# Patient Record
Sex: Female | Born: 1950
Health system: Southern US, Community
[De-identification: ages and names within clinical notes are randomized; demographics above are authoritative.]

## PROBLEM LIST (undated history)

## (undated) DIAGNOSIS — N289 Disorder of kidney and ureter, unspecified: Secondary | ICD-10-CM

## (undated) DIAGNOSIS — E039 Hypothyroidism, unspecified: Secondary | ICD-10-CM

## (undated) DIAGNOSIS — I2510 Atherosclerotic heart disease of native coronary artery without angina pectoris: Secondary | ICD-10-CM

## (undated) DIAGNOSIS — E785 Hyperlipidemia, unspecified: Secondary | ICD-10-CM

## (undated) DIAGNOSIS — E271 Primary adrenocortical insufficiency: Secondary | ICD-10-CM

## (undated) DIAGNOSIS — I951 Orthostatic hypotension: Secondary | ICD-10-CM

## (undated) DIAGNOSIS — I480 Paroxysmal atrial fibrillation: Secondary | ICD-10-CM

## (undated) DIAGNOSIS — J40 Bronchitis, not specified as acute or chronic: Secondary | ICD-10-CM

## (undated) DIAGNOSIS — F419 Anxiety disorder, unspecified: Secondary | ICD-10-CM

## (undated) DIAGNOSIS — R011 Cardiac murmur, unspecified: Secondary | ICD-10-CM

## (undated) DIAGNOSIS — M797 Fibromyalgia: Secondary | ICD-10-CM

## (undated) DIAGNOSIS — J300 Vasomotor rhinitis: Secondary | ICD-10-CM

## (undated) DIAGNOSIS — J383 Other diseases of vocal cords: Secondary | ICD-10-CM

## (undated) DIAGNOSIS — K219 Gastro-esophageal reflux disease without esophagitis: Secondary | ICD-10-CM

## (undated) DIAGNOSIS — M1990 Unspecified osteoarthritis, unspecified site: Secondary | ICD-10-CM

## (undated) DIAGNOSIS — Z9289 Personal history of other medical treatment: Secondary | ICD-10-CM

## (undated) DIAGNOSIS — R053 Chronic cough: Secondary | ICD-10-CM

## (undated) DIAGNOSIS — D649 Anemia, unspecified: Secondary | ICD-10-CM

## (undated) DIAGNOSIS — E0590 Thyrotoxicosis, unspecified without thyrotoxic crisis or storm: Secondary | ICD-10-CM

## (undated) DIAGNOSIS — E876 Hypokalemia: Secondary | ICD-10-CM

## (undated) DIAGNOSIS — I10 Essential (primary) hypertension: Secondary | ICD-10-CM

## (undated) DIAGNOSIS — H269 Unspecified cataract: Secondary | ICD-10-CM

## (undated) DIAGNOSIS — R05 Cough: Secondary | ICD-10-CM

## (undated) DIAGNOSIS — J449 Chronic obstructive pulmonary disease, unspecified: Secondary | ICD-10-CM

## (undated) HISTORY — DX: Cardiac murmur, unspecified: R01.1

## (undated) HISTORY — DX: Paroxysmal atrial fibrillation: I48.0

## (undated) HISTORY — DX: Essential (primary) hypertension: I10

## (undated) HISTORY — DX: Chronic cough: R05.3

## (undated) HISTORY — DX: Atherosclerotic heart disease of native coronary artery without angina pectoris: I25.10

## (undated) HISTORY — DX: Other diseases of vocal cords: J38.3

## (undated) HISTORY — DX: Unspecified osteoarthritis, unspecified site: M19.90

## (undated) HISTORY — PX: ABDOMINAL HYSTERECTOMY: SHX81

## (undated) HISTORY — DX: Bronchitis, not specified as acute or chronic: J40

## (undated) HISTORY — DX: Disorder of kidney and ureter, unspecified: N28.9

## (undated) HISTORY — DX: Orthostatic hypotension: I95.1

## (undated) HISTORY — DX: Unspecified cataract: H26.9

## (undated) HISTORY — DX: Fibromyalgia: M79.7

## (undated) HISTORY — DX: Hyperlipidemia, unspecified: E78.5

## (undated) HISTORY — DX: Hypokalemia: E87.6

## (undated) HISTORY — DX: Vasomotor rhinitis: J30.0

## (undated) HISTORY — DX: Chronic obstructive pulmonary disease, unspecified: J44.9

## (undated) HISTORY — PX: EYE SURGERY: SHX253

## (undated) HISTORY — DX: Gastro-esophageal reflux disease without esophagitis: K21.9

## (undated) HISTORY — DX: Anemia, unspecified: D64.9

## (undated) HISTORY — DX: Anxiety disorder, unspecified: F41.9

## (undated) HISTORY — DX: Thyrotoxicosis, unspecified without thyrotoxic crisis or storm: E05.90

## (undated) HISTORY — DX: Hypothyroidism, unspecified: E03.9

## (undated) HISTORY — DX: Cough: R05

## (undated) HISTORY — PX: COLON SURGERY: SHX602

---

## 1978-08-22 HISTORY — PX: ABDOMINAL HYSTERECTOMY: SUR658

## 1980-08-22 HISTORY — PX: OTHER SURGICAL HISTORY: SHX169

## 2001-11-20 ENCOUNTER — Ambulatory Visit (HOSPITAL_COMMUNITY): Admission: RE | Admit: 2001-11-20 | Discharge: 2001-11-20 | Payer: Self-pay | Admitting: Gastroenterology

## 2001-12-20 ENCOUNTER — Ambulatory Visit (HOSPITAL_COMMUNITY): Admission: RE | Admit: 2001-12-20 | Discharge: 2001-12-20 | Payer: Self-pay | Admitting: Gastroenterology

## 2001-12-20 ENCOUNTER — Encounter (INDEPENDENT_AMBULATORY_CARE_PROVIDER_SITE_OTHER): Payer: Self-pay | Admitting: Specialist

## 2002-01-13 ENCOUNTER — Ambulatory Visit (HOSPITAL_COMMUNITY): Admission: RE | Admit: 2002-01-13 | Discharge: 2002-01-13 | Payer: Self-pay | Admitting: Rheumatology

## 2002-01-13 ENCOUNTER — Encounter: Payer: Self-pay | Admitting: Rheumatology

## 2007-06-23 HISTORY — PX: BASAL CELL CARCINOMA EXCISION: SHX1214

## 2007-09-19 ENCOUNTER — Encounter: Payer: Self-pay | Admitting: Critical Care Medicine

## 2007-12-18 ENCOUNTER — Encounter: Payer: Self-pay | Admitting: Critical Care Medicine

## 2008-03-17 ENCOUNTER — Encounter: Payer: Self-pay | Admitting: Critical Care Medicine

## 2008-04-09 ENCOUNTER — Encounter: Payer: Self-pay | Admitting: Critical Care Medicine

## 2008-04-17 DIAGNOSIS — I498 Other specified cardiac arrhythmias: Secondary | ICD-10-CM

## 2008-04-17 DIAGNOSIS — IMO0001 Reserved for inherently not codable concepts without codable children: Secondary | ICD-10-CM | POA: Insufficient documentation

## 2008-04-17 DIAGNOSIS — M1990 Unspecified osteoarthritis, unspecified site: Secondary | ICD-10-CM | POA: Insufficient documentation

## 2008-04-17 DIAGNOSIS — I10 Essential (primary) hypertension: Secondary | ICD-10-CM

## 2008-04-18 ENCOUNTER — Ambulatory Visit: Payer: Self-pay | Admitting: Critical Care Medicine

## 2008-04-23 ENCOUNTER — Ambulatory Visit: Admission: RE | Admit: 2008-04-23 | Discharge: 2008-04-23 | Payer: Self-pay | Admitting: Critical Care Medicine

## 2008-04-23 ENCOUNTER — Ambulatory Visit: Payer: Self-pay | Admitting: Critical Care Medicine

## 2008-05-12 ENCOUNTER — Ambulatory Visit: Payer: Self-pay | Admitting: Critical Care Medicine

## 2008-05-13 ENCOUNTER — Encounter: Payer: Self-pay | Admitting: Critical Care Medicine

## 2008-05-13 DIAGNOSIS — J383 Other diseases of vocal cords: Secondary | ICD-10-CM

## 2008-05-14 ENCOUNTER — Telehealth (INDEPENDENT_AMBULATORY_CARE_PROVIDER_SITE_OTHER): Payer: Self-pay | Admitting: *Deleted

## 2008-07-01 ENCOUNTER — Ambulatory Visit: Payer: Self-pay | Admitting: Critical Care Medicine

## 2008-07-11 ENCOUNTER — Encounter: Payer: Self-pay | Admitting: Critical Care Medicine

## 2008-07-11 ENCOUNTER — Ambulatory Visit (HOSPITAL_COMMUNITY): Admission: RE | Admit: 2008-07-11 | Discharge: 2008-07-11 | Payer: Self-pay | Admitting: Critical Care Medicine

## 2008-07-14 ENCOUNTER — Encounter: Payer: Self-pay | Admitting: Critical Care Medicine

## 2008-07-25 ENCOUNTER — Encounter: Payer: Self-pay | Admitting: Critical Care Medicine

## 2008-08-01 ENCOUNTER — Ambulatory Visit: Payer: Self-pay | Admitting: Critical Care Medicine

## 2008-08-22 HISTORY — PX: THYROID SURGERY: SHX805

## 2008-08-26 ENCOUNTER — Encounter (HOSPITAL_COMMUNITY): Admission: RE | Admit: 2008-08-26 | Discharge: 2008-08-27 | Payer: Self-pay | Admitting: Endocrinology

## 2008-08-29 ENCOUNTER — Ambulatory Visit (HOSPITAL_COMMUNITY): Admission: RE | Admit: 2008-08-29 | Discharge: 2008-08-29 | Payer: Self-pay | Admitting: Endocrinology

## 2008-09-02 ENCOUNTER — Other Ambulatory Visit: Admission: RE | Admit: 2008-09-02 | Discharge: 2008-09-02 | Payer: Self-pay | Admitting: Interventional Radiology

## 2008-09-02 ENCOUNTER — Encounter: Admission: RE | Admit: 2008-09-02 | Discharge: 2008-09-02 | Payer: Self-pay | Admitting: Endocrinology

## 2008-09-02 ENCOUNTER — Encounter (INDEPENDENT_AMBULATORY_CARE_PROVIDER_SITE_OTHER): Payer: Self-pay | Admitting: Interventional Radiology

## 2008-11-05 ENCOUNTER — Encounter (INDEPENDENT_AMBULATORY_CARE_PROVIDER_SITE_OTHER): Payer: Self-pay | Admitting: General Surgery

## 2008-11-05 ENCOUNTER — Ambulatory Visit (HOSPITAL_COMMUNITY): Admission: RE | Admit: 2008-11-05 | Discharge: 2008-11-06 | Payer: Self-pay | Admitting: General Surgery

## 2008-11-05 HISTORY — PX: THYROIDECTOMY: SHX17

## 2009-02-20 ENCOUNTER — Ambulatory Visit: Payer: Self-pay | Admitting: Vascular Surgery

## 2009-02-20 ENCOUNTER — Ambulatory Visit (HOSPITAL_COMMUNITY): Admission: RE | Admit: 2009-02-20 | Discharge: 2009-02-20 | Payer: Self-pay | Admitting: Family Medicine

## 2009-03-11 ENCOUNTER — Encounter: Payer: Self-pay | Admitting: Critical Care Medicine

## 2009-04-01 ENCOUNTER — Inpatient Hospital Stay (HOSPITAL_COMMUNITY): Admission: EM | Admit: 2009-04-01 | Discharge: 2009-04-05 | Payer: Self-pay | Admitting: Emergency Medicine

## 2009-04-02 ENCOUNTER — Encounter (INDEPENDENT_AMBULATORY_CARE_PROVIDER_SITE_OTHER): Payer: Self-pay | Admitting: Internal Medicine

## 2009-04-02 ENCOUNTER — Ambulatory Visit: Payer: Self-pay | Admitting: Internal Medicine

## 2009-04-08 ENCOUNTER — Ambulatory Visit: Payer: Self-pay | Admitting: Infectious Diseases

## 2009-04-08 LAB — CONVERTED CEMR LAB
Albumin: 4.4 g/dL (ref 3.5–5.2)
BUN: 7 mg/dL (ref 6–23)
Basophils Relative: 1 % (ref 0–1)
CO2: 24 meq/L (ref 19–32)
CRP: 0.1 mg/dL (ref <0.6)
Calcium: 9.6 mg/dL (ref 8.4–10.5)
Chloride: 104 meq/L (ref 96–112)
Creatinine, Ser: 1.23 mg/dL — ABNORMAL HIGH (ref 0.40–1.20)
Eosinophils Absolute: 0 10*3/uL (ref 0.0–0.7)
Eosinophils Relative: 0 % (ref 0–5)
Glucose, Bld: 79 mg/dL (ref 70–99)
HCT: 45.5 % (ref 36.0–46.0)
Lymphs Abs: 2 10*3/uL (ref 0.7–4.0)
MCHC: 31.9 g/dL (ref 30.0–36.0)
MCV: 89.7 fL (ref >=78.0)
Monocytes Absolute: 1 10*3/uL (ref 0.1–1.0)
Monocytes Relative: 12 % (ref 3–12)
Neutrophils Relative %: 65 % (ref 43–77)
Potassium: 4.3 meq/L (ref 3.5–5.3)
RBC: 5.07 M/uL (ref 3.87–5.11)

## 2009-04-17 ENCOUNTER — Telehealth: Payer: Self-pay | Admitting: Infectious Diseases

## 2009-05-01 ENCOUNTER — Ambulatory Visit: Payer: Self-pay | Admitting: Critical Care Medicine

## 2009-05-07 ENCOUNTER — Ambulatory Visit: Payer: Self-pay | Admitting: Infectious Diseases

## 2009-05-07 DIAGNOSIS — Z862 Personal history of diseases of the blood and blood-forming organs and certain disorders involving the immune mechanism: Secondary | ICD-10-CM

## 2009-05-07 DIAGNOSIS — Z8639 Personal history of other endocrine, nutritional and metabolic disease: Secondary | ICD-10-CM

## 2009-05-07 LAB — CONVERTED CEMR LAB
ALT: 15 units/L (ref 0–35)
AST: 15 units/L (ref 0–37)
Albumin: 4.4 g/dL (ref 3.5–5.2)
Alkaline Phosphatase: 68 units/L (ref 39–117)
BUN: 7 mg/dL (ref 6–23)
Calcium: 9.1 mg/dL (ref 8.4–10.5)
Chloride: 102 meq/L (ref 96–112)
Potassium: 3.9 meq/L (ref 3.5–5.3)
Sodium: 139 meq/L (ref 135–145)

## 2009-06-24 ENCOUNTER — Ambulatory Visit: Payer: Self-pay | Admitting: Critical Care Medicine

## 2009-06-24 DIAGNOSIS — J209 Acute bronchitis, unspecified: Secondary | ICD-10-CM

## 2009-07-03 ENCOUNTER — Encounter: Payer: Self-pay | Admitting: Critical Care Medicine

## 2009-07-08 ENCOUNTER — Ambulatory Visit: Payer: Self-pay | Admitting: Critical Care Medicine

## 2009-09-11 ENCOUNTER — Ambulatory Visit: Payer: Self-pay | Admitting: Critical Care Medicine

## 2009-09-14 ENCOUNTER — Encounter: Admission: RE | Admit: 2009-09-14 | Discharge: 2009-09-14 | Payer: Self-pay | Admitting: Neurosurgery

## 2009-11-05 ENCOUNTER — Encounter: Admission: RE | Admit: 2009-11-05 | Discharge: 2009-11-05 | Payer: Self-pay | Admitting: Neurosurgery

## 2009-12-25 ENCOUNTER — Ambulatory Visit: Payer: Self-pay | Admitting: Critical Care Medicine

## 2009-12-25 DIAGNOSIS — J029 Acute pharyngitis, unspecified: Secondary | ICD-10-CM | POA: Insufficient documentation

## 2009-12-27 ENCOUNTER — Telehealth: Payer: Self-pay | Admitting: Critical Care Medicine

## 2010-02-04 ENCOUNTER — Ambulatory Visit: Payer: Self-pay | Admitting: Critical Care Medicine

## 2010-02-04 DIAGNOSIS — J018 Other acute sinusitis: Secondary | ICD-10-CM

## 2010-02-25 ENCOUNTER — Encounter: Payer: Self-pay | Admitting: Internal Medicine

## 2010-03-23 ENCOUNTER — Ambulatory Visit: Payer: Self-pay | Admitting: Internal Medicine

## 2010-03-24 ENCOUNTER — Ambulatory Visit: Payer: Self-pay | Admitting: Cardiology

## 2010-03-24 LAB — CONVERTED CEMR LAB
Basophils Absolute: 0.2 10*3/uL — ABNORMAL HIGH (ref 0.0–0.1)
Basophils Relative: 1 % (ref 0.0–3.0)
Eosinophils Absolute: 0 10*3/uL (ref 0.0–0.7)
Hemoglobin: 14.9 g/dL (ref 12.0–15.0)
Lymphocytes Relative: 16.5 % (ref 12.0–46.0)
MCHC: 34.3 g/dL (ref 30.0–36.0)
MCV: 83.3 fL (ref 78.0–100.0)
Monocytes Absolute: 0.7 10*3/uL (ref 0.1–1.0)
Neutro Abs: 12 10*3/uL — ABNORMAL HIGH (ref 1.4–7.7)
Neutrophils Relative %: 77.9 % — ABNORMAL HIGH (ref 43.0–77.0)
RBC: 5.22 M/uL — ABNORMAL HIGH (ref 3.87–5.11)
RDW: 13.9 % (ref 11.5–14.6)

## 2010-03-26 ENCOUNTER — Emergency Department (HOSPITAL_COMMUNITY): Admission: EM | Admit: 2010-03-26 | Discharge: 2010-03-26 | Payer: Self-pay | Admitting: Emergency Medicine

## 2010-03-31 ENCOUNTER — Ambulatory Visit: Payer: Self-pay | Admitting: Critical Care Medicine

## 2010-03-31 DIAGNOSIS — R05 Cough: Secondary | ICD-10-CM | POA: Insufficient documentation

## 2010-04-01 ENCOUNTER — Telehealth (INDEPENDENT_AMBULATORY_CARE_PROVIDER_SITE_OTHER): Payer: Self-pay | Admitting: *Deleted

## 2010-04-13 ENCOUNTER — Telehealth (INDEPENDENT_AMBULATORY_CARE_PROVIDER_SITE_OTHER): Payer: Self-pay | Admitting: Radiology

## 2010-04-14 ENCOUNTER — Encounter (HOSPITAL_COMMUNITY): Admission: RE | Admit: 2010-04-14 | Discharge: 2010-05-17 | Payer: Self-pay | Admitting: Cardiology

## 2010-04-14 ENCOUNTER — Ambulatory Visit: Payer: Self-pay | Admitting: Cardiology

## 2010-04-14 ENCOUNTER — Encounter: Payer: Self-pay | Admitting: Cardiology

## 2010-04-14 ENCOUNTER — Ambulatory Visit: Payer: Self-pay

## 2010-04-21 ENCOUNTER — Ambulatory Visit: Payer: Self-pay | Admitting: Critical Care Medicine

## 2010-04-21 DIAGNOSIS — J309 Allergic rhinitis, unspecified: Secondary | ICD-10-CM | POA: Insufficient documentation

## 2010-05-04 ENCOUNTER — Ambulatory Visit: Payer: Self-pay | Admitting: Critical Care Medicine

## 2010-05-12 ENCOUNTER — Telehealth (INDEPENDENT_AMBULATORY_CARE_PROVIDER_SITE_OTHER): Payer: Self-pay

## 2010-05-27 ENCOUNTER — Ambulatory Visit: Payer: Self-pay | Admitting: Critical Care Medicine

## 2010-06-15 ENCOUNTER — Ambulatory Visit: Payer: Self-pay | Admitting: Critical Care Medicine

## 2010-06-17 ENCOUNTER — Telehealth: Payer: Self-pay | Admitting: Critical Care Medicine

## 2010-07-07 ENCOUNTER — Ambulatory Visit: Payer: Self-pay | Admitting: Cardiology

## 2010-08-03 ENCOUNTER — Ambulatory Visit: Payer: Self-pay | Admitting: Cardiology

## 2010-08-19 ENCOUNTER — Ambulatory Visit: Payer: Self-pay | Admitting: Critical Care Medicine

## 2010-08-19 DIAGNOSIS — K219 Gastro-esophageal reflux disease without esophagitis: Secondary | ICD-10-CM | POA: Insufficient documentation

## 2010-09-12 ENCOUNTER — Encounter: Payer: Self-pay | Admitting: Endocrinology

## 2010-09-21 NOTE — Assessment & Plan Note (Signed)
Summary: Pulmonary OV   Copy to:  Adair Callas Primary Provider/Referring Provider:  Herb Grays  CC:  4 month follow up.  Pt states she was seen on 4/21 by PCP and was given augmentin for bronchitisis and ear infection.  Augmentin did not help so she went back to PCP on 4/28 and was given avelox.  Has 2 days left on avelox.  States she is still having sore throat when swollowing and dry cough and chest congestion.  Marland Kitchen  History of Present Illness: Pulmonary OV  59yo WF with cyclic cough and VCD with GERD and upper airway instability.  No true asthma.  Methacholine Challenge 11/09 Neg for reactive airway disease  September 11, 2009 11:15 AM Cough came back, worse since 12/25 and colder weather.  This past week much worse. The pt  saw PCP and  no bad bronchitis.  UTI was dx. The pt was dx with strep throat pos in 12/10 and rx abx avelox then per pcp., hydromet was rx as well in 12/10 and has some left The pt  was ok for about 4 weeks then worse. no heartburn.  no pndrip.  the throat is dry  Dec 25, 2009 4:09 PM The pt developed otitis media  and bronchits developed 4/11  and saw NP  and PCP and rx avelox after augmentin did not work.  The pts noted her chronic barky cough was better, but now the bronchitis has aggravated the baseline cough.  The pt was rx with 10d of avelox and has 2 days left.  Now mucous is dry, yellow discoloration is better.  No real pndrip.  The pt has pain in R ear if swallows.     Preventive Screening-Counseling & Management  Alcohol-Tobacco     Smoking Status: never  Current Medications (verified): 1)  Spironolactone 25 Mg Tabs (Spironolactone) .Marland Kitchen.. 1 By Mouth Daily 2)  Digoxin 0.25 Mg Tabs (Digoxin) .Marland Kitchen.. 1 By Mouth Daily 3)  Metoprolol Tartrate 25 Mg Tabs (Metoprolol Tartrate) .... Once Daily 4)  Lasix 20 Mg Tabs (Furosemide) .Marland Kitchen.. 1 By Mouth Daily and As Needed 5)  Zolpidem Tartrate 10 Mg Tabs (Zolpidem Tartrate) .... At Bedtime As Needed 6)  Amitriptyline Hcl 50 Mg  Tabs (Amitriptyline Hcl) .Marland Kitchen.. 1 By Mouth Daily 7)  Kapidex 60 Mg Cpdr (Dexlansoprazole) .Marland Kitchen.. 1 By Mouth Daily 8)  Excedrin Migraine 250-250-65 Mg Tabs (Aspirin-Acetaminophen-Caffeine) .... As Needed 9)  Tylenol Arthritis Pain 650 Mg Cr-Tabs (Acetaminophen) .... As Needed 10)  Synthroid 50 Mcg Tabs (Levothyroxine Sodium) .... Take 1 Tablet By Mouth Once A Day Mon-Fri 11)  Synthroid 75 Mcg Tabs (Levothyroxine Sodium) .... Take 1 Tablet By Mouth Once A Day Sat & Sun Only 12)  Ranitidine Hcl 300 Mg Caps (Ranitidine Hcl) .... Take 1 Tablet By Mouth Once A Day At Bedtime 13)  Alprazolam 0.25 Mg Tabs (Alprazolam) .... As Needed 14)  Tessalon Perles 100 Mg  Caps (Benzonatate) .... One To Two By Mouth 3-4 Times Daily As Needed Cough 15)  Cyclobenzaprine Hcl 10 Mg Tabs (Cyclobenzaprine Hcl) .... Take 1/2-1 Tab By Mouth At Bedtime As Needed 16)  Delsym 30 Mg/9ml Lqcr (Dextromethorphan Polistirex) .... As Needed 17)  Tussicaps 10-8 Mg Xr12h-Cap (Hydrocod Polst-Chlorphen Polst) .... Take 1 Capsule By Mouth Two Times A Day As Needed 18)  Avelox 400 Mg Tabs (Moxifloxacin Hcl) .... Once Daily 19)  Hydromet 5-1.5 Mg/88ml Syrp (Hydrocodone-Homatropine) .... As Directed  Allergies (verified): 1)  ! Codeine 2)  ! Erythromycin 3)  !  Sulfa  Past History:  Past medical, surgical, family and social histories (including risk factors) reviewed, and no changes noted (except as noted below).  Past Medical History: Reviewed history from 05/01/2009 and no changes required. Current Problems:  OTHER SPECIFIED CARDIAC DYSRHYTHMIAS (ICD-427.89) FIBROMYALGIA (ICD-729.1) OSTEOARTHRITIS (ICD-715.90) HYPERTENSION (ICD-401.9) VASOMOTOR RHINITIS (ICD-477.9) exacerbates VCD ASTHMA (ICD-493.90)--->No true asthma    -normal spirometry 2009 FeV1>90%predicted    -MeCholine challenge neg 11/09 Vocal cord dysfunction proven on FOB 9/09. GERD exacerbating VCD Hyperthyroidism with hot nodule     -total thyroidectomy 11/05/08   benign  Past Surgical History: Reviewed history from 05/01/2009 and no changes required. hysterectomy 1980  Basal cell ca 11/08 Total thyroidectomy 11/05/08  Family History: Reviewed history from 04/18/2008 and no changes required. Family History Asthma--mother and granddaughter Family History Emphysema -- father Heart disease--mother and father  Social History: Reviewed history from 05/01/2009 and no changes required. 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   Review of Systems       The patient complains of shortness of breath with activity, shortness of breath at rest, non-productive cough, acid heartburn, difficulty swallowing, sore throat, and ear ache.  The patient denies productive cough, coughing up blood, chest pain, irregular heartbeats, indigestion, loss of appetite, weight change, abdominal pain, tooth/dental problems, headaches, nasal congestion/difficulty breathing through nose, sneezing, itching, anxiety, depression, hand/feet swelling, joint stiffness or pain, rash, change in color of mucus, and fever.    Vital Signs:  Patient profile:   60 year old female Height:      64 inches Weight:      118 pounds BMI:     20.33 O2 Sat:      99 % on Room air Temp:     97.4 degrees F oral Pulse rate:   75 / minute BP sitting:   104 / 72  (left arm) Cuff size:   regular  Vitals Entered By: Gweneth Dimitri RN (Dec 25, 2009 4:01 PM)  O2 Flow:  Room air CC: 4 month follow up.  Pt states she was seen on 4/21 by PCP and was given augmentin for bronchitisis and ear infection.  Augmentin did not help so she went back to PCP on 4/28 and was given avelox.  Has 2 days left on avelox.  States she is still having sore throat when swollowing, dry cough and chest congestion.   Comments Medications reviewed with patient Daytime contact number verified with patient. Gweneth Dimitri RN  Dec 25, 2009 4:01 PM    Physical Exam  Additional Exam:  Gen: WD WN     WF     in  NAD    NCAT Heent:  no jvd, no TMG, no cervical LNademopathy, orophyx clear,  mild postnasal drip, no nares purulence, erythematous throat with mucous Cor: RRR nl s1/s2  no s3/s4  no m r h g Abd: soft NT BSA   no masses  No HSM  no rebound or guarding Ext perfused with no c v e v.d Neuro: intact, moves all 4s, CN II-XII intact, DTRs intact Chest: clear; prominent pseudowheeze persists skin clear     Impression & Recommendations:  Problem # 1:  PHARYNGITIS, ACUTE (ICD-462) Assessment Deteriorated strep screen neg, failed avelox therapy with acute pharyngitis plan: suprax for 7days d/c avelox  The following medications were removed from the medication list:    Cephalexin 500 Mg Caps (Cephalexin) .Marland Kitchen... Three times a day Her updated medication list for this problem includes:  Tylenol Arthritis Pain 650 Mg Cr-tabs (Acetaminophen) .Marland Kitchen... As needed    Suprax 400 Mg Tabs (Cefixime) .Marland Kitchen... Take one twice daily  Orders: Depo- Medrol 80mg  (J1040) Depo- Medrol 40mg  (J1030) Admin of Therapeutic Inj  intramuscular or subcutaneous (91478) TLB-Beta Strep Screen (87880-STRSCR)  Problem # 2:  VOCAL CORD DISORDER (ICD-478.5) Assessment: Unchanged VCD and cyclical cough  due to upper airway instability and GERD plan resume cyclic cough protocol depo 120mg  IM stay on reflux diet stayon kapidex/ranitidine Orders: Est. Patient Level IV (29562)  Medications Added to Medication List This Visit: 1)  Lasix 20 Mg Tabs (Furosemide) .Marland Kitchen.. 1 by mouth daily and as needed 2)  Avelox 400 Mg Tabs (Moxifloxacin hcl) .... Once daily 3)  Suprax 400 Mg Tabs (Cefixime) .... Take one twice daily 4)  Hydromet 5-1.5 Mg/1ml Syrp (Hydrocodone-homatropine) .... As directed  Complete Medication List: 1)  Spironolactone 25 Mg Tabs (Spironolactone) .Marland Kitchen.. 1 by mouth daily 2)  Digoxin 0.25 Mg Tabs (Digoxin) .Marland Kitchen.. 1 by mouth daily 3)  Metoprolol Tartrate 25 Mg Tabs (Metoprolol tartrate) .... Once daily 4)  Lasix 20  Mg Tabs (Furosemide) .Marland Kitchen.. 1 by mouth daily and as needed 5)  Zolpidem Tartrate 10 Mg Tabs (Zolpidem tartrate) .... At bedtime as needed 6)  Amitriptyline Hcl 50 Mg Tabs (Amitriptyline hcl) .Marland Kitchen.. 1 by mouth daily 7)  Kapidex 60 Mg Cpdr (Dexlansoprazole) .Marland Kitchen.. 1 by mouth daily 8)  Excedrin Migraine 250-250-65 Mg Tabs (Aspirin-acetaminophen-caffeine) .... As needed 9)  Tylenol Arthritis Pain 650 Mg Cr-tabs (Acetaminophen) .... As needed 10)  Synthroid 50 Mcg Tabs (Levothyroxine sodium) .... Take 1 tablet by mouth once a day mon-fri 11)  Synthroid 75 Mcg Tabs (Levothyroxine sodium) .... Take 1 tablet by mouth once a day sat & sun only 12)  Ranitidine Hcl 300 Mg Caps (Ranitidine hcl) .... Take 1 tablet by mouth once a day at bedtime 13)  Alprazolam 0.25 Mg Tabs (Alprazolam) .... As needed 14)  Tessalon Perles 100 Mg Caps (Benzonatate) .... One to two by mouth 3-4 times daily as needed cough 15)  Cyclobenzaprine Hcl 10 Mg Tabs (Cyclobenzaprine hcl) .... Take 1/2-1 tab by mouth at bedtime as needed 16)  Delsym 30 Mg/62ml Lqcr (Dextromethorphan polistirex) .... As needed 17)  Tussicaps 10-8 Mg Xr12h-cap (Hydrocod polst-chlorphen polst) .... Take 1 capsule by mouth two times a day as needed 18)  Suprax 400 Mg Tabs (Cefixime) .... Take one twice daily 19)  Hydromet 5-1.5 Mg/79ml Syrp (Hydrocodone-homatropine) .... As directed  Patient Instructions: 1)  Stop avelox 2)  Start Suprax one twice daily 3)  Strep throat swab today 4)  Start cyclic cough protocol with tussicaps/tessalon 5)  Stay on Kapidex/ranitidine 6)  follow reflux diet strictly 7)  A depomedrol 120mg  injection will be given 8)  Return one month  Prescriptions: TESSALON PERLES 100 MG  CAPS (BENZONATATE) One to two by mouth 3-4 times daily as needed cough  #90 x 6   Entered and Authorized by:   Storm Frisk MD   Signed by:   Storm Frisk MD on 12/25/2009   Method used:   Electronically to        [facility]* (retail)        [address]       [place], [place]  [address]       Ph: [phone]       Fax: [phone]   RxID:   [id number] SUPRAX 400 MG  TABS (CEFIXIME) Take one twice daily  #14 x 0   Entered and Authorized by:   Storm Frisk MD   Signed by:   Storm Frisk MD on 12/25/2009   Method used:   Electronically to        [facility]* (retail)       [address]       [place], [place]  [address]       Ph: [phone]       Fax: [phone]   RxID:   [id number] TUSSICAPS 10-8 MG XR12H-CAP (HYDROCOD POLST-CHLORPHEN POLST) Take 1 capsule by mouth two times a day as needed  #30 x 1   Entered and Authorized by:   Storm Frisk MD   Signed by:   Storm Frisk MD on 12/25/2009   Method used:   Print then Give to Patient   RxID:   [id number]   Prevention & Chronic Care Immunizations   Influenza vaccine: Historical  (06/22/2009)    Tetanus booster: Not documented    Pneumococcal vaccine: Not documented    H. zoster vaccine: Not documented  Colorectal Screening   Hemoccult: Not documented    Colonoscopy: Not documented  Other Screening   Pap smear: Not documented    Mammogram: Not documented    DXA bone density scan: Not documented   Smoking status: never  (12/25/2009)  Lipids   Total Cholesterol: Not documented   LDL: Not documented   LDL Direct: Not documented   HDL: Not documented   Triglycerides: Not documented  Hypertension   Last Blood Pressure: 104 / 72  (12/25/2009)   Serum creatinine: 1.03  (05/07/2009)   Serum potassium 3.9  (05/07/2009)  Self-Management Support :    Hypertension self-management support: Not documented    Medication Administration  Injection # 1:    Medication: Depo- Medrol 80mg     Diagnosis: PHARYNGITIS, ACUTE (ICD-462)    Route: IM    Site: LUOQ gluteus    Exp Date: 06/2012    Lot #: [id number]    Mfr: [employer]    Patient tolerated injection  without complications    Given by: Gweneth Dimitri RN (Dec 25, 2009 4:53 PM)  Injection # 2:    Medication: Depo- Medrol 40mg     Diagnosis: PHARYNGITIS, ACUTE (ICD-462)    Route: IM    Site: LUOQ gluteus    Exp Date: 06/2012    Lot #: [id number]    Mfr: [employer]    Patient tolerated injection without complications    Given by: Gweneth Dimitri RN (Dec 25, 2009 4:53 PM)  Orders Added: 1)  Est. Patient Level IV [57322] 2)  Depo- Medrol 80mg  [J1040] 3)  Depo- Medrol 40mg  [J1030] 4)  Admin of Therapeutic Inj  intramuscular or subcutaneous [96372] 5)  TLB-Beta Strep Screen [87880-STRSCR]   Appended Document: Pulmonary OV fax tammy spear

## 2010-09-21 NOTE — Assessment & Plan Note (Signed)
Summary: Pulmonary OV   Copy to:  [place] Callas Primary Provider/Referring Provider:  Herb Grays  CC:  Pt here for follow up. Pt c/o increased SOB with exertion and at rest when talking. Productive cough worse in the AMs with thick yellow mucus, headache, sore throat, and and a little wheezing x 2 to 3 days.  History of Present Illness: Pulmonary OV  59yo WF with cyclic cough and VCD with GERD and upper airway instability.  No true asthma.  Methacholine Challenge 11/09 Neg for reactive airway disease  September 11, 2009 11:15 AM Cough came back, worse since 12/25 and colder weather.  This past week much worse. The pt  saw PCP and  no bad bronchitis.  UTI was dx. The pt was dx with strep throat pos in 12/10 and rx abx avelox then per pcp., hydromet was rx as well in 12/10 and has some left The pt  was ok for about 4 weeks then worse. no heartburn.  no pndrip.  the throat is dry  Dec 25, 2009 4:09 PM The pt developed otitis media  and bronchits developed 4/11  and saw NP  and PCP and rx avelox after augmentin did not work.  The pts noted her chronic barky cough was better, but now the bronchitis has aggravated the baseline cough.  The pt was rx with 10d of avelox and has 2 days left.  Now mucous is dry, yellow discoloration is better.  No real pndrip.  The pt has pain in R ear if swallows.  February 04, 2010 3:05 PM Pt seen May6/11.  Pt had otitis media and pharyngitis and was rx with suprax. Strep screen was neg. Pt improved until one week ago then worse.   Now:  more dyspnea, throat hurts to talk, headache, cough loose rattle, not as much the dry barky cough. "donkey " cough is better.  Hoarseness is better.  Ears are slightly full but not painful.   Cough is productive in the am of thick yellow.  Preventive Screening-Counseling & Management  Alcohol-Tobacco     Smoking Status: never  Current Medications (verified): 1)  Spironolactone 25 Mg Tabs (Spironolactone) .Marland Kitchen.. 1 By Mouth Daily 2)   Digoxin 0.25 Mg Tabs (Digoxin) .Marland Kitchen.. 1 By Mouth Daily 3)  Metoprolol Tartrate 25 Mg Tabs (Metoprolol Tartrate) .... Once Daily 4)  Lasix 20 Mg Tabs (Furosemide) .Marland Kitchen.. 1 By Mouth Daily and As Needed 5)  Amitriptyline Hcl 50 Mg Tabs (Amitriptyline Hcl) .Marland Kitchen.. 1 By Mouth Daily 6)  Kapidex 60 Mg Cpdr (Dexlansoprazole) .Marland Kitchen.. 1 By Mouth Daily 7)  Excedrin Migraine 250-250-65 Mg Tabs (Aspirin-Acetaminophen-Caffeine) .... As Needed 8)  Tylenol Arthritis Pain 650 Mg Cr-Tabs (Acetaminophen) .... As Needed 9)  Synthroid 50 Mcg Tabs (Levothyroxine Sodium) .... Take 1 Tablet By Mouth Once A Day Mon-Fri 10)  Synthroid 75 Mcg Tabs (Levothyroxine Sodium) .... Take 1 Tablet By Mouth Once A Day Sat & Sun Only 11)  Ranitidine Hcl 300 Mg Caps (Ranitidine Hcl) .... Take 1 Tablet By Mouth Once A Day At Bedtime 12)  Alprazolam 0.25 Mg Tabs (Alprazolam) .... As Needed 13)  Tessalon Perles 100 Mg  Caps (Benzonatate) .... One To Two By Mouth 3-4 Times Daily As Needed Cough 14)  Cyclobenzaprine Hcl 10 Mg Tabs (Cyclobenzaprine Hcl) .... Take 1/2-1 Tab By Mouth At Bedtime As Needed 15)  Delsym 30 Mg/63ml Lqcr (Dextromethorphan Polistirex) .... As Needed 16)  Tussicaps 10-8 Mg Xr12h-Cap (Hydrocod Polst-Chlorphen Polst) .... Take 1 Capsule By Mouth Two  Times A Day As Needed  Allergies (verified): 1)  ! Codeine 2)  ! Erythromycin 3)  ! Sulfa  Past History:  Past medical, surgical, family and social histories (including risk factors) reviewed, and no changes noted (except as noted below).  Past Medical History: Reviewed history from 05/01/2009 and no changes required. Current Problems:  OTHER SPECIFIED CARDIAC DYSRHYTHMIAS (ICD-427.89) FIBROMYALGIA (ICD-729.1) OSTEOARTHRITIS (ICD-715.90) HYPERTENSION (ICD-401.9) VASOMOTOR RHINITIS (ICD-477.9) exacerbates VCD ASTHMA (ICD-493.90)--->No true asthma    -normal spirometry 2009 FeV1>90%predicted    -MeCholine challenge neg 11/09 Vocal cord dysfunction proven on FOB  9/09. GERD exacerbating VCD Hyperthyroidism with hot nodule     -total thyroidectomy 11/05/08  benign  Past Surgical History: Reviewed history from 05/01/2009 and no changes required. hysterectomy 1980  Basal cell ca 11/08 Total thyroidectomy 11/05/08  Family History: Reviewed history from 04/18/2008 and no changes required. Family History Asthma--mother and granddaughter Family History Emphysema -- father Heart disease--mother and father  Social History: Reviewed history from 05/01/2009 and no changes required. 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   Review of Systems       The patient complains of shortness of breath with activity, productive cough, non-productive cough, sore throat, and nasal congestion/difficulty breathing through nose.  The patient denies shortness of breath at rest, coughing up blood, chest pain, irregular heartbeats, acid heartburn, indigestion, loss of appetite, weight change, abdominal pain, difficulty swallowing, tooth/dental problems, headaches, sneezing, itching, ear ache, anxiety, depression, hand/feet swelling, joint stiffness or pain, rash, change in color of mucus, and fever.    Vital Signs:  Patient profile:   59 year old female Height:      64 inches Weight:      119 pounds BMI:     20.50 O2 Sat:      100 % on Room air Temp:     98.5 degrees F oral Pulse rate:   104 / minute BP sitting:   122 / 86  (left arm) Cuff size:   regular  Vitals Entered By: Zackery Barefoot CMA (February 04, 2010 2:55 PM)  O2 Flow:  Room air CC: Pt here for follow up. Pt c/o increased SOB with exertion and at rest when talking. Productive cough worse in the AMs with thick yellow mucus, headache, sore throat, and a little wheezing x 2 to 3 days Comments Medications reviewed with patient Verified contact number and pharmacy with patient Zackery Barefoot CMA  February 04, 2010 2:56 PM    Physical Exam  Additional Exam:  Gen: WD WN     WF     in  NAD    NCAT Heent:  no jvd, no TMG, no cervical LNademopathy, orophyx clear,  mild postnasal drip,  mild  nares purulence, both TMs clear  Cor: RRR nl s1/s2  no s3/s4  no m r h g Abd: soft NT BSA   no masses  No HSM  no rebound or guarding Ext perfused with no c v e v.d Neuro: intact, moves all 4s, CN II-XII intact, DTRs intact Chest: clear; prominent pseudowheeze persists skin clear     Impression & Recommendations:  Problem # 1:  OTHER ACUTE SINUSITIS (ICD-461.8) Assessment Deteriorated Acute sinusitis as ppt factor for cyclical cough, no true asthma plan Start Suprax one twice daily Veramyst/flonase two sprays each nostril daily Resume tussicaps one twice daily as needed for severe cough A depomedrol 120mg  injection will be given Return 6 weeks either [place] or [place] office  The following  medications were removed from the medication list:    Hydromet 5-1.5 Mg/14ml Syrp (Hydrocodone-homatropine) .Marland Kitchen... As directed Her updated medication list for this problem includes:    Tessalon Perles 100 Mg Caps (Benzonatate) ..... One to two by mouth 3-4 times daily as needed cough    Delsym 30 Mg/34ml Lqcr (Dextromethorphan polistirex) .Marland Kitchen... As needed    Tussicaps 10-8 Mg Xr12h-cap (Hydrocod polst-chlorphen polst) .Marland Kitchen... Take 1 capsule by mouth two times a day as needed    Suprax 400 Mg Tabs (Cefixime) ..... One by mouth two times a day  generic ok    Fluticasone Propionate 50 Mcg/act Susp (Fluticasone propionate) .Marland Kitchen..Marland Kitchen Two sprays each nostril daily  Orders: Est. Patient Level V (57322)  Problem # 2:  VOCAL CORD DISORDER (ICD-478.5) Assessment: Unchanged  Orders: Est. Patient Level V (02542)  VCD and cyclical cough  due to upper airway instability and GERD plan cont GERD rx cont tussicaps/tessalon prn  Medications Added to Medication List This Visit: 1)  Suprax 400 Mg Tabs (Cefixime) .... One by mouth two times a day  generic ok 2)  Fluticasone Propionate 50 Mcg/act Susp  (Fluticasone propionate) .... Two sprays each nostril daily  Complete Medication List: 1)  Spironolactone 25 Mg Tabs (Spironolactone) .Marland Kitchen.. 1 by mouth daily 2)  Digoxin 0.25 Mg Tabs (Digoxin) .Marland Kitchen.. 1 by mouth daily 3)  Metoprolol Tartrate 25 Mg Tabs (Metoprolol tartrate) .... Once daily 4)  Lasix 20 Mg Tabs (Furosemide) .Marland Kitchen.. 1 by mouth daily and as needed 5)  Amitriptyline Hcl 50 Mg Tabs (Amitriptyline hcl) .Marland Kitchen.. 1 by mouth daily 6)  Kapidex 60 Mg Cpdr (Dexlansoprazole) .Marland Kitchen.. 1 by mouth daily 7)  Excedrin Migraine 250-250-65 Mg Tabs (Aspirin-acetaminophen-caffeine) .... As needed 8)  Tylenol Arthritis Pain 650 Mg Cr-tabs (Acetaminophen) .... As needed 9)  Synthroid 50 Mcg Tabs (Levothyroxine sodium) .... Take 1 tablet by mouth once a day mon-fri 10)  Synthroid 75 Mcg Tabs (Levothyroxine sodium) .... Take 1 tablet by mouth once a day sat & sun only 11)  Ranitidine Hcl 300 Mg Caps (Ranitidine hcl) .... Take 1 tablet by mouth once a day at bedtime 12)  Alprazolam 0.25 Mg Tabs (Alprazolam) .... As needed 13)  Tessalon Perles 100 Mg Caps (Benzonatate) .... One to two by mouth 3-4 times daily as needed cough 14)  Cyclobenzaprine Hcl 10 Mg Tabs (Cyclobenzaprine hcl) .... Take 1/2-1 tab by mouth at bedtime as needed 15)  Delsym 30 Mg/36ml Lqcr (Dextromethorphan polistirex) .... As needed 16)  Tussicaps 10-8 Mg Xr12h-cap (Hydrocod polst-chlorphen polst) .... Take 1 capsule by mouth two times a day as needed 17)  Suprax 400 Mg Tabs (Cefixime) .... One by mouth two times a day  generic ok 18)  Fluticasone Propionate 50 Mcg/act Susp (Fluticasone propionate) .... Two sprays each nostril daily  Other Orders: Depo- Medrol 80mg  (J1040) Depo- Medrol 40mg  (J1030) Admin of Therapeutic Inj  intramuscular or subcutaneous (70623)  Patient Instructions: 1)  Start Suprax one twice daily 2)  Veramyst/flonase two sprays each nostril daily 3)  Resume tussicaps one twice daily as needed for severe cough 4)  A  depomedrol 120mg  injection will be given 5)  Return 6 weeks either [place] or [place] office  Prescriptions: FLUTICASONE PROPIONATE 50 MCG/ACT SUSP (FLUTICASONE PROPIONATE) Two sprays each nostril daily  #1 x 6   Entered and Authorized by:   Storm Frisk MD   Signed by:   Storm Frisk MD on 02/04/2010   Method used:   Electronically  to        [facility]* (retail)       [address]       [place], [place]  [address]       Ph: [phone]       Fax: [phone]   RxID:   [id number] TUSSICAPS 10-8 MG XR12H-CAP (HYDROCOD POLST-CHLORPHEN POLST) Take 1 capsule by mouth two times a day as needed  #20 x 0   Entered and Authorized by:   Storm Frisk MD   Signed by:   Storm Frisk MD on 02/04/2010   Method used:   Print then Give to Patient   RxID:   [id number] SUPRAX 400 MG TABS (CEFIXIME) One by mouth two times a day  generic ok  #14 x 0   Entered and Authorized by:   Storm Frisk MD   Signed by:   Storm Frisk MD on 02/04/2010   Method used:   Electronically to        [facility]* (retail)       [address]       [place], [place]  [address]       Ph: [phone]       Fax: [phone]   RxID:   [id number]       Medication Administration  Injection # 1:    Medication: Depo- Medrol 80mg     Diagnosis: ACUTE BRONCHITIS (ICD-466.0)    Route: IM    Site: LUOQ gluteus    Exp Date: 03/2012    Lot #: [id number]    Mfr: Pharmacia    Patient tolerated injection without complications    Given by: Zackery Barefoot CMA (February 04, 2010 4:03 PM)  Injection # 2:    Medication: Depo- Medrol 40mg     Diagnosis: ACUTE BRONCHITIS (ICD-466.0)    Route: IM    Site: LUOQ gluteus    Exp Date: 03/2012    Lot #: [id number]    Mfr: Pharmacia    Patient tolerated injection without complications    Given by: Zackery Barefoot CMA (February 04, 2010 4:04 PM)  Orders Added: 1)  Est. Patient  Level V [81856] 2)  Depo- Medrol 80mg  [J1040] 3)  Depo- Medrol 40mg  [J1030] 4)  Admin of Therapeutic Inj  intramuscular or subcutaneous [96372]   Appended Document: Pulmonary OV fax Herb Grays

## 2010-09-21 NOTE — Assessment & Plan Note (Signed)
Summary: Pulmonary OV   Copy to:  [place] Callas Primary Provider/Referring Provider:  Herb Grays  CC:  60 mo follow up.  states cough did get a little better however was seen by PCP on 1/18 and was told she has bronchitis-was given cephalexin x10days.  states cough has gotten worse since she has came down with bronchitis.  Marland Kitchen  History of Present Illness: Pulmonary OV  60yo WF with cyclic cough and VCD with GERD and upper airway instability.  No true asthma.  Methacholine Challenge 11/09 Neg for reactive airway disease  August 01, 2008 9:25 AM Has been to [facility] voice [facility] and has a diagnosis of spasmodic dysphonia.  Neurontin Rx with some reduction in cough and hoarseness.  botox injection also offered but pt has declined this. Has had two days of flu like illness with more dyspnea.  Some soreness in throat.  cough remains dry. The pt had Methacholine challenge and this was neg.  Pt denies any significant sore throat, nasal congestion or excess secretions, fever, chills, sweats, unintended weight loss, pleurtic or exertional chest pain, orthopnea PND, or leg swelling Pt denies any increase in rescue therapy over baseline, denies waking up needing it or having any early am or nocturnal exacerbations of coughing/wheezing/or dyspnea.   September 11, 2009 11:15 AM Cough came back, worse since 12/25 and colder weather.  This past week much worse. The pt  saw PCP and  no bad bronchitis.  UTI was dx. The pt was dx with strep throat pos in 12/10 and rx abx avelox then per pcp., hydromet was rx as well in 12/10 and has some left The pt  was ok for about 4 weeks then worse. no heartburn.  no pndrip.  the throat is dry August 01, 2008 9:25 AM  two days of emesis and flu like illness this is new for few days diff cough  more difficult to talk more sob with exertion  before this still chronic cough  still sore in throat has been to [facility] twice:   Dr Delford Field   ?increase neurontin   sent to [facility]:   has dx spasmodic dysphonia  rec botox injections does not want to do this?    September 11, 2009 11:15 AM Cough came back, worse since 12/25 and colder weather.  This past week much worse.  saw PCP  no bad bronchitis.  UTI was dx. strep throat pos in 12/10 and rx abx avelox then per pcp., hydromet was rx as well in 12/10 and has some left was ok for about 4 weeks then worse no heartburn.  no pndrip.  the throat is dry   Preventive Screening-Counseling & Management  Alcohol-Tobacco     Smoking Status: never  Current Medications (verified): 1)  Spironolactone 25 Mg Tabs (Spironolactone) .Marland Kitchen.. 1 By Mouth Daily 2)  Digoxin 0.25 Mg Tabs (Digoxin) .Marland Kitchen.. 1 By Mouth Daily 3)  Metoprolol Tartrate 25 Mg Tabs (Metoprolol Tartrate) .... Once Daily 4)  Lasix 20 Mg Tabs (Furosemide) .Marland Kitchen.. 1 By Mouth Daily 5)  Zolpidem Tartrate 10 Mg Tabs (Zolpidem Tartrate) .... At Bedtime As Needed 6)  Amitriptyline Hcl 50 Mg Tabs (Amitriptyline Hcl) .Marland Kitchen.. 1 By Mouth Daily 7)  Kapidex 60 Mg Cpdr (Dexlansoprazole) .Marland Kitchen.. 1 By Mouth Daily 8)  Excedrin Migraine 250-250-65 Mg Tabs (Aspirin-Acetaminophen-Caffeine) .... As Needed 9)  Tylenol Arthritis Pain 650 Mg Cr-Tabs (Acetaminophen) .... As Needed 10)  Synthroid 50 Mcg Tabs (Levothyroxine Sodium) .... Take 1 Tablet By Mouth Once A Day Mon-Fri  11)  Synthroid 75 Mcg Tabs (Levothyroxine Sodium) .... Take 1 Tablet By Mouth Once A Day Sat & Sun Only 12)  Ranitidine Hcl 300 Mg Caps (Ranitidine Hcl) .... Take 1 Tablet By Mouth Once A Day At Bedtime 13)  Alprazolam 0.25 Mg Tabs (Alprazolam) .... As Needed 14)  Tessalon Perles 100 Mg  Caps (Benzonatate) .... One To Two By Mouth 3-4 Times Daily As Needed Cough 15)  Cyclobenzaprine Hcl 10 Mg Tabs (Cyclobenzaprine Hcl) .... Take 1/2-1 Tab By Mouth At Bedtime As Needed 16)  Delsym 30 Mg/69ml Lqcr (Dextromethorphan Polistirex) .... As Needed 17)  Tussicaps 10-8 Mg Xr12h-Cap (Hydrocod Polst-Chlorphen Polst) .... Take 1 Capsule By Mouth  Two Times A Day As Needed 18)  Cephalexin 500 Mg Caps (Cephalexin) .... Three Times A Day  Allergies (verified): 1)  ! Codeine 2)  ! Erythromycin 3)  ! Sulfa  Past History:  Past medical, surgical, family and social histories (including risk factors) reviewed, and no changes noted (except as noted below).  Past Medical History: Reviewed history from 05/01/2009 and no changes required. Current Problems:  OTHER SPECIFIED CARDIAC DYSRHYTHMIAS (ICD-427.89) FIBROMYALGIA (ICD-729.1) OSTEOARTHRITIS (ICD-715.90) HYPERTENSION (ICD-401.9) VASOMOTOR RHINITIS (ICD-477.9) exacerbates VCD ASTHMA (ICD-493.90)--->No true asthma    -normal spirometry 2009 FeV1>90%predicted    -MeCholine challenge neg 11/09 Vocal cord dysfunction proven on FOB 9/09. GERD exacerbating VCD Hyperthyroidism with hot nodule     -total thyroidectomy 11/05/08  benign  Past Surgical History: Reviewed history from 05/01/2009 and no changes required. hysterectomy 1980  Basal cell ca 11/08 Total thyroidectomy 11/05/08  Family History: Reviewed history from 04/18/2008 and no changes required. Family History Asthma--mother and granddaughter Family History Emphysema -- father Heart disease--mother and father  Social History: Reviewed history from 05/01/2009 and no changes required. 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   Review of Systems       The patient complains of productive cough, non-productive cough, chest pain, change in color of mucus, difficulty swallowing, and sore throat.  The patient denies shortness of breath with activity, shortness of breath at rest, coughing up blood, irregular heartbeats, acid heartburn, indigestion, loss of appetite, weight change, abdominal pain, tooth/dental problems, headaches, nasal congestion/difficulty breathing through nose, sneezing, itching, ear ache, anxiety, depression, hand/feet swelling, joint stiffness or pain, rash, and fever.    Vital  Signs:  Patient profile:   60 year old female Height:      64 inches Weight:      112.25 pounds BMI:     19.34 O2 Sat:      100 % on Room air Temp:     97.7 degrees F oral Pulse rate:   85 / minute BP sitting:   90 / 68  (left arm) Cuff size:   regular  Vitals Entered By: Gweneth Dimitri RN (September 11, 2009 11:11 AM)  O2 Flow:  Room air CC: 60 mo follow up.  states cough did get a little better however was seen by PCP on 1/18 and was told she has bronchitis-was given cephalexin x10days.  states cough has gotten worse since she has came down with bronchitis.   Comments Medications reviewed with patient Daytime contact number verified with patient. Crystal Jones RN  September 11, 2009 11:11 AM    Physical Exam  Additional Exam:  Gen: WD WN     WF     in NAD    NCAT Heent:  no jvd, no TMG, no cervical LNademopathy, orophyx clear,  mild postnasal  drip, no nares purulence Cor: RRR nl s1/s2  no s3/s4  no m r h g Abd: soft NT BSA   no masses  No HSM  no rebound or guarding Ext perfused with no c v e v.d Neuro: intact, moves all 4s, CN II-XII intact, DTRs intact Chest: clear; prominent pseudowheeze persists skin clear     Impression & Recommendations:  Problem # 1:  ACUTE BRONCHITIS (ICD-466.0) Assessment Deteriorated Suspect lower airway tracheobronchitis flare plan Resume cyclic cough protocol with tussicaps/tessalon A depomedrol injection will be given 120mg  Start avelox one daily in addition to cephalexin Return two months Her updated medication list for this problem includes:    Tessalon Perles 100 Mg Caps (Benzonatate) ..... One to two by mouth 3-4 times daily as needed cough    Delsym 30 Mg/26ml Lqcr (Dextromethorphan polistirex) .Marland Kitchen... As needed    Tussicaps 10-8 Mg Xr12h-cap (Hydrocod polst-chlorphen polst) .Marland Kitchen... Take 1 capsule by mouth two times a day as needed    Cephalexin 500 Mg Caps (Cephalexin) .Marland Kitchen... Three times a day    Avelox 400 Mg Tabs (Moxifloxacin hcl) .....  By mouth daily  Orders: Est. Patient Level IV (04540) Depo- Medrol 40mg  (J1030) Depo- Medrol 80mg  (J1040) Admin of Therapeutic Inj  intramuscular or subcutaneous (98119)  Medications Added to Medication List This Visit: 1)  Cephalexin 500 Mg Caps (Cephalexin) .... Three times a day 2)  Avelox 400 Mg Tabs (Moxifloxacin hcl) .... By mouth daily  Complete Medication List: 1)  Spironolactone 25 Mg Tabs (Spironolactone) .Marland Kitchen.. 1 by mouth daily 2)  Digoxin 0.25 Mg Tabs (Digoxin) .Marland Kitchen.. 1 by mouth daily 3)  Metoprolol Tartrate 25 Mg Tabs (Metoprolol tartrate) .... Once daily 4)  Lasix 20 Mg Tabs (Furosemide) .Marland Kitchen.. 1 by mouth daily 5)  Zolpidem Tartrate 10 Mg Tabs (Zolpidem tartrate) .... At bedtime as needed 6)  Amitriptyline Hcl 50 Mg Tabs (Amitriptyline hcl) .Marland Kitchen.. 1 by mouth daily 7)  Kapidex 60 Mg Cpdr (Dexlansoprazole) .Marland Kitchen.. 1 by mouth daily 8)  Excedrin Migraine 250-250-65 Mg Tabs (Aspirin-acetaminophen-caffeine) .... As needed 9)  Tylenol Arthritis Pain 650 Mg Cr-tabs (Acetaminophen) .... As needed 10)  Synthroid 50 Mcg Tabs (Levothyroxine sodium) .... Take 1 tablet by mouth once a day mon-fri 11)  Synthroid 75 Mcg Tabs (Levothyroxine sodium) .... Take 1 tablet by mouth once a day sat & sun only 12)  Ranitidine Hcl 300 Mg Caps (Ranitidine hcl) .... Take 1 tablet by mouth once a day at bedtime 13)  Alprazolam 0.25 Mg Tabs (Alprazolam) .... As needed 14)  Tessalon Perles 100 Mg Caps (Benzonatate) .... One to two by mouth 3-4 times daily as needed cough 15)  Cyclobenzaprine Hcl 10 Mg Tabs (Cyclobenzaprine hcl) .... Take 1/2-1 tab by mouth at bedtime as needed 16)  Delsym 30 Mg/65ml Lqcr (Dextromethorphan polistirex) .... As needed 17)  Tussicaps 10-8 Mg Xr12h-cap (Hydrocod polst-chlorphen polst) .... Take 1 capsule by mouth two times a day as needed 18)  Cephalexin 500 Mg Caps (Cephalexin) .... Three times a day 19)  Avelox 400 Mg Tabs (Moxifloxacin hcl) .... By mouth daily  Patient  Instructions: 1)  Resume cyclic cough protocol with tussicaps/tessalon 2)  A depomedrol injection will be given 120mg  3)  Start avelox one daily in addition to cephalexin 4)  Return two months Prescriptions: AVELOX 400 MG  TABS (MOXIFLOXACIN HCL) By mouth daily Brand medically necessary #5 x 0   Entered and Authorized by:   Storm Frisk MD   Signed by:  Storm Frisk MD on 09/11/2009   Method used:   Electronically to        [facility]* (retail)       [address] [place], [place]  [address]       Ph: [phone]       Fax: [phone]   RxID:   [id number] TUSSICAPS 10-8 MG XR12H-CAP (HYDROCOD POLST-CHLORPHEN POLST) Take 1 capsule by mouth two times a day as needed  #20 x 0   Entered and Authorized by:   Storm Frisk MD   Signed by:   Storm Frisk MD on 09/11/2009   Method used:   Print then Give to Patient   RxID:   [id number]    Immunization History:  Influenza Immunization History:    Influenza:  historical (06/22/2009)    Medication Administration  Injection # 1:    Medication: Depo- Medrol 40mg     Diagnosis: ACUTE BRONCHITIS (ICD-466.0)    Route: IM    Site: LUOQ gluteus    Exp Date: 05/2010    Lot #: [id number]    Mfr: Teva    Patient tolerated injection without complications    Given by: Gweneth Dimitri RN (September 11, 2009 11:34 AM)  Injection # 2:    Medication: Depo- Medrol 80mg     Diagnosis: ACUTE BRONCHITIS (ICD-466.0)    Route: IM    Site: LUOQ gluteus    Exp Date: 05/2010    Lot #: [id number]    Mfr: Teva    Patient tolerated injection without complications    Given by: Gweneth Dimitri RN (September 11, 2009 11:34 AM)  Orders Added: 1)  Est. Patient Level IV [56433] 2)  Depo- Medrol 40mg  [J1030] 3)  Depo- Medrol 80mg  [J1040] 4)  Admin of Therapeutic Inj  intramuscular or subcutaneous [96372]   Appended Document: Pulmonary OV fax Herb Grays

## 2010-09-21 NOTE — Progress Notes (Signed)
Summary: Strep screen result negative  Phone Note Outgoing Call   Reason for Call: Discuss lab or test results Summary of Call: call the pt and tell her the strep screen was neg,  stay on meds as prescribed anyway  Initial call taken by: Storm Frisk MD,  Dec 27, 2009 3:40 PM  Follow-up for Phone Call        Called, spoke with pt.  Pt informed of strep results and recs as stated above per PW.  She verbalized understanding.  Gweneth Dimitri RN  Dec 28, 2009 8:35 AM

## 2010-09-21 NOTE — Progress Notes (Signed)
Summary: avelox  Phone Note Call from Patient   Caller: Patient Call For: wright Summary of Call: pt was seen yesterday. she says that she only received 1 avelox tab at ov and the rx called in was not for 4 days as she understood it would be. after calling pharm, pt wants nurse to call and confirm this was corrected. cell [phone] Initial call taken by: Tivis Ringer, CNA,  April 01, 2010 8:56 AM  Follow-up for Phone Call        Dr. Delford Field, looks like on pt instructions you wanted her to have 5 days of avelox, but only # 3 were sent to pharm.  Pls clarify rx, thanks! Follow-up by: Vernie Murders,  April 01, 2010 9:02 AM  Additional Follow-up for Phone Call Additional follow up Details #1::        she should have been given two avelox samples call in #4 total to pharmacy or she can come by office and pick up one more free avelox sample   Additional Follow-up by: Storm Frisk MD,  April 01, 2010 9:04 AM    Additional Follow-up for Phone Call Additional follow up Details #2::    Spoke with pt and advised of the above recs per PW.  She states that she would rather stop by here and get sample of avelox. Vernie Murders  April 01, 2010 9:12 AM

## 2010-09-21 NOTE — Assessment & Plan Note (Signed)
Summary: Acute NP office visit - bronchitis   Copy to:  Custar Callas Primary Provider/Referring Provider:  Herb Grays  CC:  HA, sore throat, right ear discomfort, increased SOB, and prod cough with light yellow mucus x4days - denies f/c/s.  History of Present Illness: 59yo WF with cyclic cough and VCD with GERD and upper airway instability.  No true asthma.  Methacholine Challenge 11/09 Neg for reactive airway disease   February 04, 2010  Pt seen May6/11.  Pt had otitis media and pharyngitis and was rx with suprax. Strep screen was neg. Pt improved until one week ago then worse.   Now:  more dyspnea, throat hurts to talk, headache, cough loose rattle, not as much the dry barky cough. "donkey " cough is better.  Hoarseness is better.  Ears are slightly full but not painful.   Cough is productive in the am of thick yellow. Start Suprax one twice daily Veramyst/flonase two sprays each nostril daily Resume tussicaps one twice daily as needed for severe cough A depomedrol 120mg  IM  better, then worse mid July, rx abx and tramadol no prednsione  page 2 March 23, 2010 Acute visit.  Pt c/o cough- prod in the am with yellow sputum.  She also c/o "feels like throat closing"- having diff with swallowing x 3 days.  only times she's been better were both on cough suppression either tussicaps or tramdol.  Pt denies any significant sore throat, itching, sneezing,  nasal congestion or excess secretions,  fever, chills, sweats, unintended wt loss, pleuritic or exertional cp, hempoptysis, change in activity tolerance  orthopnea pnd or leg swelling. Pt also denies any obvious fluctuation in symptoms with weather or environmental change or other alleviating or aggravating factors.    March 31, 2010 3:43 PM The pt has had one week of progressive cough and sore throat.  Pt seen by MW 8/2 and rx wtih cough suppressants, no abx.  Pt then fell and sent to ED.  No abx given. WBC elevated.  Pt dehydrated.  Pt not admitted.  CXR was neg.  Pt injured RLE.  Pt still with sore throat. Strep screen neg.  Still coughing in a paroxysmal nature.  Cough is non productive.  There is sinus drainage.  Dr Sherene Sires did a CT sinus that was neg. Pt states tramadol makes the pt dizzy   April 21, 2010 9:21 AM At last ov we rec: Take avelox one daily for 5 days Stay on flonase daily Start Patanase two sprays each nostril twice daily No prednisone Use Delsym OTC as needed for cough, or Mucinex DM twice daily was ok then backwards again Throat is sore and ear hurts on R side. Never had R ear eval. sinus ct was neg.,  Nose is ok.  Notes sneezing.   delsym helps at night  PAGE 3 >>  May 04, 2010 8:55 AM The pt is now noting less cough and dyspnea.  The throat is less sore.  There is still R ear pain but this is less. No f/c/s.  The cough is less.  The cough is non productive.  The pt has finished the ABX. Pt denies any significant sore throat, nasal congestion or excess secretions, fever, chills, sweats, unintended weight loss, pleurtic or exertional chest pain, orthopnea PND, or leg swelling Pt denies any increase in rescue therapy over baseline, denies waking up needing it or having any early am or nocturnal exacerbations of coughing/wheezing/or dyspnea.  May 27, 2010--Presents for an acute office  visit. Complains of headache, , sore throat, right ear discomfort, increased dyspnea,  prod cough with light yellow mucus x4days . OTC not helping. Cough keeps coming back. Waxes and wanes. Gets a little better but never totally goes away. The last 4 days cough has been really bad, Mainly just dry cough. No fever.  She is using delsym with out much help. Denies chest pain,  orthopnea, hemoptysis, fever, n/v/d, edema, headache.   Medications Prior to Update: 1)  Spironolactone 25 Mg Tabs (Spironolactone) .Marland Kitchen.. 1 By Mouth Daily 2)  Digoxin 0.25 Mg Tabs (Digoxin) .Marland Kitchen.. 1 By Mouth Daily 3)  Metoprolol Tartrate 25 Mg Tabs (Metoprolol  Tartrate) .... Once Daily 4)  Lasix 20 Mg Tabs (Furosemide) .Marland Kitchen.. 1 By Mouth Daily and As Needed 5)  Amitriptyline Hcl 50 Mg Tabs (Amitriptyline Hcl) .Marland Kitchen.. 1 By Mouth Daily 6)  Kapidex 60 Mg Cpdr (Dexlansoprazole) .... Take  One 30-60 Min Before First Meal of The Day 7)  Excedrin Migraine 250-250-65 Mg Tabs (Aspirin-Acetaminophen-Caffeine) .... As Needed 8)  Tylenol Arthritis Pain 650 Mg Cr-Tabs (Acetaminophen) .... As Needed 9)  Synthroid 75 Mcg Tabs (Levothyroxine Sodium) .Marland Kitchen.. 1 Once Daily 10)  Ranitidine Hcl 300 Mg Caps (Ranitidine Hcl) .... Take 1 Tablet By Mouth Once A Day At Bedtime 11)  Alprazolam 0.25 Mg Tabs (Alprazolam) .... As Needed 12)  Cyclobenzaprine Hcl 10 Mg Tabs (Cyclobenzaprine Hcl) .... Take 1/2-1 Tab By Mouth At Bedtime As Needed 13)  Delsym 30 Mg/50ml Lqcr (Dextromethorphan Polistirex) .... As Needed 14)  Fluticasone Propionate 50 Mcg/act Susp (Fluticasone Propionate) .... Two Sprays Each Nostril Daily 15)  Patanase 0.6 % Soln (Olopatadine Hcl) .... Two Sprays Each Nostril Twice Daily Pt Failed Astelin/astepro  Current Medications (verified): 1)  Spironolactone 25 Mg Tabs (Spironolactone) .Marland Kitchen.. 1 By Mouth Daily 2)  Digoxin 0.25 Mg Tabs (Digoxin) .Marland Kitchen.. 1 By Mouth Daily 3)  Metoprolol Tartrate 25 Mg Tabs (Metoprolol Tartrate) .... Once Daily 4)  Lasix 20 Mg Tabs (Furosemide) .Marland Kitchen.. 1 By Mouth Daily and As Needed 5)  Amitriptyline Hcl 50 Mg Tabs (Amitriptyline Hcl) .Marland Kitchen.. 1 By Mouth Daily 6)  Kapidex 60 Mg Cpdr (Dexlansoprazole) .... Take  One 30-60 Min Before First Meal of The Day 7)  Excedrin Migraine 250-250-65 Mg Tabs (Aspirin-Acetaminophen-Caffeine) .... As Needed 8)  Tylenol Arthritis Pain 650 Mg Cr-Tabs (Acetaminophen) .... As Needed 9)  Synthroid 75 Mcg Tabs (Levothyroxine Sodium) .Marland Kitchen.. 1 Once Daily 10)  Ranitidine Hcl 300 Mg Caps (Ranitidine Hcl) .... Take 1 Tablet By Mouth Once A Day At Bedtime 11)  Alprazolam 0.25 Mg Tabs (Alprazolam) .... As Needed 12)  Cyclobenzaprine  Hcl 10 Mg Tabs (Cyclobenzaprine Hcl) .... Take 1/2-1 Tab By Mouth At Bedtime As Needed 13)  Delsym 30 Mg/85ml Lqcr (Dextromethorphan Polistirex) .... As Needed 14)  Fluticasone Propionate 50 Mcg/act Susp (Fluticasone Propionate) .... Two Sprays Each Nostril Daily 15)  Patanase 0.6 % Soln (Olopatadine Hcl) .... Two Sprays Each Nostril Twice Daily Pt Failed Astelin/astepro  Allergies (verified): 1)  ! Codeine 2)  ! Erythromycin 3)  ! Sulfa 4)  ! Tramadol Hcl  Past History:  Past Medical History: Last updated: 03/23/2010 FIBROMYALGIA (ICD-729.1) OSTEOARTHRITIS (ICD-715.90) HYPERTENSION (ICD-401.9) VASOMOTOR RHINITIS (ICD-477.9) exacerbates VCD  ASTHMA (ICD-493.90)--->No true asthma    -normal spirometry 2009 FeV1>90%predicted    -MethaCholine challenge neg 11/09 Vocal cord dysfunction proven on FOB 9/09 Chronic cough      - Sinus ct March 23, 2010 >>>      - Allergy profile  March 23, 2010 >>>      - Flutter valve rx March 23, 2010   GERD exacerbating VCD Hyperthyroidism with hot nodule     -total thyroidectomy 11/05/08  benign  Past Surgical History: Last updated: 05/01/2009 hysterectomy 1980  Basal cell ca 11/08 Total thyroidectomy 11/05/08  Family History: Last updated: 04/18/2008 Family History Asthma--mother and granddaughter Family History Emphysema -- father Heart disease--mother and father  Social History: Last updated: 05/01/2009 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   Risk Factors: Alcohol Use: occassionally (05/04/2010) Caffeine Use: 0 (05/07/2009) Exercise: no (05/07/2009)  Risk Factors: Smoking Status: never (05/04/2010) Passive Smoke Exposure: yes (05/04/2010)  Review of Systems      See HPI  Vital Signs:  Patient profile:   60 year old female Height:      64 inches Weight:      119 pounds BMI:     20.50 O2 Sat:      99 % on Room air Temp:     98.0 degrees F oral Pulse rate:   86 / minute BP sitting:   96 / 70   (left arm) Cuff size:   regular  Vitals Entered By: Boone Master CNA/MA (May 27, 2010 4:15 PM)  O2 Flow:  Room air CC: HA, sore throat, right ear discomfort, increased SOB, prod cough with light yellow mucus x4days - denies f/c/s Is Patient Diabetic? No Comments Medications reviewed with patient Daytime contact number verified with patient. Boone Master CNA/MA  May 27, 2010 4:15 PM    Physical Exam  Additional Exam:  anxious amb wf depressed affect, barking harsh  quality upper airway cough Heent:  no jvd, no TMG, no cervical LNademopathy, orophyx clear Cor: RRR nl s1/s2  no s3/s4  no m r h g Abd: soft NT BSA   no masses  No HSM  no rebound or guarding Ext perfused with no c v e v.d Neuro: intact, moves all 4s, CN II-XII intact, DTRs intact Chest: coarse BS with upper airway psuedowheezing    Impression & Recommendations:  Problem # 1:  COUGH (ICD-786.2)  Cyclical cough w/ flare  Plan:  YOUR GOAL IS NOT COUGHING OR THROAT CLEARING.  Prednisone taper over next week.  Add zyrtec 10mg  at bedtime  Add Deslym 2 tsp two times a day  Add Tessalon three times a day  May use Hydromet 1-2 tsp every 4-6 hrs as needed cough Use sugarless candy, ice chips and water to help prevent cough/throat clearing.  Voice rest  follow up 2 weeks Dr. Delford Field or Parrett.   Orders: Est. Patient Level IV (29562)  Medications Added to Medication List This Visit: 1)  Prednisone 10 Mg Tabs (Prednisone) .... 4 tabs for 2 days, then 3 tabs for 2 days, 2 tabs for 2 days, then 1 tab for 2 days, then stop 2)  Tessalon 200 Mg Caps (Benzonatate) .Marland Kitchen.. 1 by mouth three times a day as needed cough 3)  Hydromet 5-1.5 Mg/52ml Syrp (Hydrocodone-homatropine) .Marland Kitchen.. 1-2 tsp every 4-6 hr as needed cough  Complete Medication List: 1)  Spironolactone 25 Mg Tabs (Spironolactone) .Marland Kitchen.. 1 by mouth daily 2)  Digoxin 0.25 Mg Tabs (Digoxin) .Marland Kitchen.. 1 by mouth daily 3)  Metoprolol Tartrate 25 Mg Tabs (Metoprolol  tartrate) .... Once daily 4)  Lasix 20 Mg Tabs (Furosemide) .Marland Kitchen.. 1 by mouth daily and as needed 5)  Amitriptyline Hcl 50 Mg Tabs (Amitriptyline hcl) .Marland Kitchen.. 1 by mouth daily 6)  Kapidex 60 Mg Cpdr (Dexlansoprazole) .... Take  one 30-60 min before first meal of the day 7)  Excedrin Migraine 250-250-65 Mg Tabs (Aspirin-acetaminophen-caffeine) .... As needed 8)  Tylenol Arthritis Pain 650 Mg Cr-tabs (Acetaminophen) .... As needed 9)  Synthroid 75 Mcg Tabs (Levothyroxine sodium) .Marland Kitchen.. 1 once daily 10)  Ranitidine Hcl 300 Mg Caps (Ranitidine hcl) .... Take 1 tablet by mouth once a day at bedtime 11)  Alprazolam 0.25 Mg Tabs (Alprazolam) .... As needed 12)  Cyclobenzaprine Hcl 10 Mg Tabs (Cyclobenzaprine hcl) .... Take 1/2-1 tab by mouth at bedtime as needed 13)  Delsym 30 Mg/39ml Lqcr (Dextromethorphan polistirex) .... As needed 14)  Fluticasone Propionate 50 Mcg/act Susp (Fluticasone propionate) .... Two sprays each nostril daily 15)  Patanase 0.6 % Soln (Olopatadine hcl) .... Two sprays each nostril twice daily pt failed astelin/astepro 16)  Prednisone 10 Mg Tabs (Prednisone) .... 4 tabs for 2 days, then 3 tabs for 2 days, 2 tabs for 2 days, then 1 tab for 2 days, then stop 17)  Tessalon 200 Mg Caps (Benzonatate) .Marland Kitchen.. 1 by mouth three times a day as needed cough 18)  Hydromet 5-1.5 Mg/51ml Syrp (Hydrocodone-homatropine) .Marland Kitchen.. 1-2 tsp every 4-6 hr as needed cough  Patient Instructions: 1)  YOUR GOAL IS NOT COUGHING OR THROAT CLEARING.  2)  Prednisone taper over next week.  3)  Add zyrtec 10mg  at bedtime  4)  Add Deslym 2 tsp two times a day  5)  Add Tessalon three times a day  6)  May use Hydromet 1-2 tsp every 4-6 hrs as needed cough 7)  Use sugarless candy, ice chips and water to help prevent cough/throat clearing.  8)  Voice rest  9)  follow up 2 weeks Dr. Delford Field or Parrett.  Prescriptions: HYDROMET 5-1.5 MG/5ML SYRP (HYDROCODONE-HOMATROPINE) 1-2 tsp every 4-6 hr as needed cough  #8 oz x 0    Entered and Authorized by:   Rubye Oaks NP   Signed by:   Tammy Parrett NP on 05/27/2010   Method used:   Print then Give to Patient   RxID:   [id number] TESSALON 200 MG CAPS (BENZONATATE) 1 by mouth three times a day as needed cough  #45 x 3   Entered and Authorized by:   Rubye Oaks NP   Signed by:   Tammy Parrett NP on 05/27/2010   Method used:   Electronically to        [facility] # [phone]* (retail)       [address]       [place], [place]  [address]       Ph: [phone]       Fax: [phone]   RxID:   [id number] PREDNISONE 10 MG TABS (PREDNISONE) 4 tabs for 2 days, then 3 tabs for 2 days, 2 tabs for 2 days, then 1 tab for 2 days, then stop  #20 x 0   Entered and Authorized by:   Rubye Oaks NP   Signed by:   Rubye Oaks NP on 05/27/2010   Method used:   Electronically to        [facility] # [phone]* (retail)       [address]       [place], [place]  [address]       Ph: [phone]       Fax: [phone]   RxID:   [id number]

## 2010-09-21 NOTE — Assessment & Plan Note (Signed)
Summary: Pulmonary OV   Copy to:  Anoka Callas Primary Provider/Referring Provider:  Herb Grays  CC:  3 wk follow up.  Pt states cough improved until this past Sunday.  Since Sunday, coughing more frequently, sore throat, right ear pain, and chest soreness.  Cough is prod in the mornings with small amount of yellow mucus..  History of Present Illness: 60yo WF with cyclic cough and VCD with GERD and upper airway instability.  No true asthma.  Methacholine Challenge 11/09 Neg for reactive airway disease  September 11, 2009  Cough came back, worse since 12/25 and colder weather.  This past week much worse. The pt  saw PCP and  no bad bronchitis.  UTI was dx. The pt was dx with strep throat pos in 12/10 and rx abx avelox then per pcp., hydromet was rx as well in 12/10 and has some left The pt  was ok for about 4 weeks then worse. no heartburn.  no pndrip.  the throat is dry  Dec 25, 2009  The pt developed otitis media  and bronchits developed 4/11  and saw NP  and PCP and rx avelox after augmentin did not work.  The pts noted her chronic barky cough was better, but now the bronchitis has aggravated the baseline cough.  The pt was rx with 10d of avelox and has 2 days left.  Now mucous is dry, yellow discoloration is better.  No real pndrip.  The pt has pain in R ear if swallows.  February 04, 2010  Pt seen May6/11.  Pt had otitis media and pharyngitis and was rx with suprax. Strep screen was neg. Pt improved until one week ago then worse.   Now:  more dyspnea, throat hurts to talk, headache, cough loose rattle, not as much the dry barky cough. "donkey " cough is better.  Hoarseness is better.  Ears are slightly full but not painful.   Cough is productive in the am of thick yellow. Start Suprax one twice daily Veramyst/flonase two sprays each nostril daily Resume tussicaps one twice daily as needed for severe cough A depomedrol 120mg  IM  better, then worse mid July, rx abx and tramadol no  prednsione  page 2 March 23, 2010 Acute visit.  Pt c/o cough- prod in the am with yellow sputum.  She also c/o "feels like throat closing"- having diff with swallowing x 3 days.  only times she's been better were both on cough suppression either tussicaps or tramdol.  Pt denies any significant sore throat, itching, sneezing,  nasal congestion or excess secretions,  fever, chills, sweats, unintended wt loss, pleuritic or exertional cp, hempoptysis, change in activity tolerance  orthopnea pnd or leg swelling. Pt also denies any obvious fluctuation in symptoms with weather or environmental change or other alleviating or aggravating factors.    March 31, 2010 3:43 PM The pt has had one week of progressive cough and sore throat.  Pt seen by MW 8/2 and rx wtih cough suppressants, no abx.  Pt then fell and sent to ED.  No abx given. WBC elevated.  Pt dehydrated.  Pt not admitted. CXR was neg.  Pt injured RLE.  Pt still with sore throat. Strep screen neg.  Still coughing in a paroxysmal nature.  Cough is non productive.  There is sinus drainage.  Dr Sherene Sires did a CT sinus that was neg. Pt states tramadol makes the pt dizzy   April 21, 2010 9:21 AM At last ov we rec:  Take avelox one daily for 5 days Stay on flonase daily Start Patanase two sprays each nostril twice daily No prednisone Use Delsym OTC as needed for cough, or Mucinex DM twice daily was ok then backwards again Throat is sore and ear hurts on R side. Never had R ear eval. sinus ct was neg.,  Nose is ok.  Notes sneezing.   delsym helps at night    Preventive Screening-Counseling & Management  Alcohol-Tobacco     Alcohol drinks/day: occassionally     Alcohol type: beer     Smoking Status: never     Passive Smoke Exposure: yes  Current Medications (verified): 1)  Spironolactone 25 Mg Tabs (Spironolactone) .Marland Kitchen.. 1 By Mouth Daily 2)  Digoxin 0.25 Mg Tabs (Digoxin) .Marland Kitchen.. 1 By Mouth Daily 3)  Metoprolol Tartrate 25 Mg Tabs (Metoprolol  Tartrate) .... Once Daily 4)  Lasix 20 Mg Tabs (Furosemide) .Marland Kitchen.. 1 By Mouth Daily and As Needed 5)  Amitriptyline Hcl 50 Mg Tabs (Amitriptyline Hcl) .Marland Kitchen.. 1 By Mouth Daily 6)  Kapidex 60 Mg Cpdr (Dexlansoprazole) .... Take  One 30-60 Min Before First Meal of The Day 7)  Excedrin Migraine 250-250-65 Mg Tabs (Aspirin-Acetaminophen-Caffeine) .... As Needed 8)  Tylenol Arthritis Pain 650 Mg Cr-Tabs (Acetaminophen) .... As Needed 9)  Synthroid 75 Mcg Tabs (Levothyroxine Sodium) .Marland Kitchen.. 1 Once Daily 10)  Ranitidine Hcl 300 Mg Caps (Ranitidine Hcl) .... Take 1 Tablet By Mouth Once A Day At Bedtime 11)  Alprazolam 0.25 Mg Tabs (Alprazolam) .... As Needed 12)  Cyclobenzaprine Hcl 10 Mg Tabs (Cyclobenzaprine Hcl) .... Take 1/2-1 Tab By Mouth At Bedtime As Needed 13)  Delsym 30 Mg/19ml Lqcr (Dextromethorphan Polistirex) .... As Needed 14)  Fluticasone Propionate 50 Mcg/act Susp (Fluticasone Propionate) .... Two Sprays Each Nostril Daily 15)  Patanase 0.6 % Soln (Olopatadine Hcl) .... Two Sprays Each Nostril Twice Daily Pt Failed Astelin/astepro  Allergies (verified): 1)  ! Codeine 2)  ! Erythromycin 3)  ! Sulfa 4)  ! Tramadol Hcl  Past History:  Past medical, surgical, family and social histories (including risk factors) reviewed, and no changes noted (except as noted below).  Past Medical History: Reviewed history from 03/23/2010 and no changes required. FIBROMYALGIA (ICD-729.1) OSTEOARTHRITIS (ICD-715.90) HYPERTENSION (ICD-401.9) VASOMOTOR RHINITIS (ICD-477.9) exacerbates VCD  ASTHMA (ICD-493.90)--->No true asthma    -normal spirometry 2009 FeV1>90%predicted    -MethaCholine challenge neg 11/09 Vocal cord dysfunction proven on FOB 9/09 Chronic cough      - Sinus ct March 23, 2010 >>>      - Allergy profile March 23, 2010 >>>      - Flutter valve rx March 23, 2010   GERD exacerbating VCD Hyperthyroidism with hot nodule     -total thyroidectomy 11/05/08  benign  Past Surgical  History: Reviewed history from 05/01/2009 and no changes required. hysterectomy 1980  Basal cell ca 11/08 Total thyroidectomy 11/05/08  Family History: Reviewed history from 04/18/2008 and no changes required. Family History Asthma--mother and granddaughter Family History Emphysema -- father Heart disease--mother and father  Social History: Reviewed history from 05/01/2009 and no changes required. 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   Review of Systems       The patient complains of non-productive cough.  The patient denies shortness of breath with activity, shortness of breath at rest, productive cough, coughing up blood, chest pain, irregular heartbeats, acid heartburn, indigestion, loss of appetite, weight change, abdominal pain, difficulty swallowing, sore throat, tooth/dental problems,  headaches, nasal congestion/difficulty breathing through nose, sneezing, itching, ear ache, anxiety, depression, hand/feet swelling, joint stiffness or pain, rash, change in color of mucus, and fever.    Vital Signs:  Patient profile:   60 year old female Height:      64 inches Weight:      119.13 pounds BMI:     20.52 O2 Sat:      100 % on Room air Temp:     98.5 degrees F oral Pulse rate:   99 / minute BP sitting:   110 / 70  (left arm) Cuff size:   regular  Vitals Entered By: Gweneth Dimitri RN (April 21, 2010 9:19 AM)  O2 Flow:  Room air CC: 3 wk follow up.  Pt states cough improved until this past Sunday.  Since Sunday, coughing more frequently, sore throat, right ear pain, chest soreness.  Cough is prod in the mornings with small amount of yellow mucus. Comments Medications reviewed with patient Daytime contact number verified with patient. Gweneth Dimitri RN  April 21, 2010 9:19 AM    Physical Exam  Additional Exam:  anxious amb wf depressed affect, barking honking quality upper airway cough Heent:  no jvd, no TMG, no cervical LNademopathy, orophyx  clear, purulence over R tonsil,  pharyngitis noted Cor: RRR nl s1/s2  no s3/s4  no m r h g Abd: soft NT BSA   no masses  No HSM  no rebound or guarding Ext perfused with no c v e v.d Neuro: intact, moves all 4s, CN II-XII intact, DTRs intact Chest: clear; prominent pseudowheeze persists, no cough on insp or exp maneuvers skin clear     Impression & Recommendations:  Problem # 1:  PHARYNGITIS, ACUTE (ICD-462) Assessment Deteriorated acute pharyngitis with tonsilar infection,  cyclic cough, upper airway instability, gerd plan augmentin two times a day x 7days delsym clarinex D daily patanase nasal stay on flonase depomedrol 120mg  IM  Her updated medication list for this problem includes:    Tylenol Arthritis Pain 650 Mg Cr-tabs (Acetaminophen) .Marland Kitchen... As needed    Amoxicillin-pot Clavulanate 875-125 Mg Tabs (Amoxicillin-pot clavulanate) ..... One by mouth two times a day  Orders: Est. Patient Level IV (16109) Depo- Medrol 40mg  (J1030) Depo- Medrol 80mg  (J1040) Admin of Therapeutic Inj  intramuscular or subcutaneous (60454)  Medications Added to Medication List This Visit: 1)  Amoxicillin-pot Clavulanate 875-125 Mg Tabs (Amoxicillin-pot clavulanate) .... One by mouth two times a day 2)  Clarinex-d 24 Hour 5-240 Mg Xr24h-tab (Desloratadine-pseudoephedrine) .... One by mouth daily  Complete Medication List: 1)  Spironolactone 25 Mg Tabs (Spironolactone) .Marland Kitchen.. 1 by mouth daily 2)  Digoxin 0.25 Mg Tabs (Digoxin) .Marland Kitchen.. 1 by mouth daily 3)  Metoprolol Tartrate 25 Mg Tabs (Metoprolol tartrate) .... Once daily 4)  Lasix 20 Mg Tabs (Furosemide) .Marland Kitchen.. 1 by mouth daily and as needed 5)  Amitriptyline Hcl 50 Mg Tabs (Amitriptyline hcl) .Marland Kitchen.. 1 by mouth daily 6)  Kapidex 60 Mg Cpdr (Dexlansoprazole) .... Take  one 30-60 min before first meal of the day 7)  Excedrin Migraine 250-250-65 Mg Tabs (Aspirin-acetaminophen-caffeine) .... As needed 8)  Tylenol Arthritis Pain 650 Mg Cr-tabs  (Acetaminophen) .... As needed 9)  Synthroid 75 Mcg Tabs (Levothyroxine sodium) .Marland Kitchen.. 1 once daily 10)  Ranitidine Hcl 300 Mg Caps (Ranitidine hcl) .... Take 1 tablet by mouth once a day at bedtime 11)  Alprazolam 0.25 Mg Tabs (Alprazolam) .... As needed 12)  Cyclobenzaprine Hcl 10 Mg Tabs (Cyclobenzaprine hcl) .... Take  1/2-1 tab by mouth at bedtime as needed 13)  Delsym 30 Mg/81ml Lqcr (Dextromethorphan polistirex) .... As needed 14)  Fluticasone Propionate 50 Mcg/act Susp (Fluticasone propionate) .... Two sprays each nostril daily 15)  Patanase 0.6 % Soln (Olopatadine hcl) .... Two sprays each nostril twice daily pt failed astelin/astepro 16)  Amoxicillin-pot Clavulanate 875-125 Mg Tabs (Amoxicillin-pot clavulanate) .... One by mouth two times a day 17)  Clarinex-d 24 Hour 5-240 Mg Xr24h-tab (Desloratadine-pseudoephedrine) .... One by mouth daily  Patient Instructions: 1)  Take augmentin 875mg  two times a day x 10days rx sent to pharmacy 2)  Stay on flonase daily 3)  Start Patanase two sprays each nostril twice daily 4)  A depomedrol 120mg  injection will be given 5)  Use Delsym OTC as needed for cough, or Mucinex DM twice daily 6)  Use clarinex D one daily for 15days then stop, use samples  Prescriptions: AMOXICILLIN-POT CLAVULANATE 875-125 MG TABS (AMOXICILLIN-POT CLAVULANATE) One by mouth two times a day  #20 x 0   Entered and Authorized by:   Storm Frisk MD   Signed by:   Storm Frisk MD on 04/21/2010   Method used:   Electronically to        [facility] # [phone]* (retail)       [address]       [place], [place]  [address]       Ph: [phone]       Fax: [phone]   RxID:   [id number]    Immunization History:  Pneumovax Immunization History:    Pneumovax:  historical (08/23/2003)    Medication Administration  Injection # 1:    Medication: Depo- Medrol 40mg     Diagnosis: PHARYNGITIS, ACUTE (ICD-462)    Route: IM    Site: LUOQ gluteus    Exp  Date: 11/2012    Lot #: [id number]    Mfr: [employer]    Patient tolerated injection without complications    Given by: Zackery Barefoot CMA (April 21, 2010 9:57 AM)  Injection # 2:    Medication: Depo- Medrol 80mg     Diagnosis: PHARYNGITIS, ACUTE (ICD-462)    Route: IM    Site: LUOQ gluteus    Exp Date: 11/2012    Lot #: [id number]    Mfr: [employer]    Patient tolerated injection without complications    Given by: Zackery Barefoot CMA (April 21, 2010 9:58 AM)  Orders Added: 1)  Est. Patient Level IV [57322] 2)  Depo- Medrol 40mg  [J1030] 3)  Depo- Medrol 80mg  [J1040] 4)  Admin of Therapeutic Inj  intramuscular or subcutaneous [96372]   Appended Document: Pulmonary OV fax tammy spears

## 2010-09-21 NOTE — Letter (Signed)
Summary: [facility]   Imported By: Sherian Rein 03/31/2010 09:52:02  _____________________________________________________________________  External Attachment:    Type:   Image     Comment:   External Document

## 2010-09-21 NOTE — Progress Notes (Signed)
Summary: Dallergy discontinued-try chlorpheneramine  Phone Note From Pharmacy   Caller: [facility] # [phone]* Call For: Kaitlyn Adams  Summary of Call: Received faxed request stating dallergy PE and all subs for this medication are discontinued.  Reqesting this med be changed to another drug.  Fax placed in PW's to do folder and will forward message to him to address.  Initial call taken by: Gweneth Dimitri RN,  June 17, 2010 3:03 PM  Follow-up for Phone Call        will have to try OTC form of chlorpheniramine 4mg  q6h as needed  Follow-up by: Storm Frisk MD,  June 17, 2010 5:17 PM  Additional Follow-up for Phone Call Additional follow up Details #1::        ATC pt x 2 and inform of the above, line busy both times. WCB again on 06/18/10. Vernie Murders  June 17, 2010 5:20 PM  pt advised to try chlor tabs.Carron Curie CMA  June 18, 2010 11:01 AM

## 2010-09-21 NOTE — Progress Notes (Signed)
  Phone Note Other Incoming   Request: Send information Summary of Call: Request received from [facility]. Forwarded to [employer].    

## 2010-09-21 NOTE — Assessment & Plan Note (Signed)
Summary: Cardiology Nuclear Testing  Nuclear Med Background Indications for Stress Test: Evaluation for Ischemia, Post Hospital, Abnormal EKG   History: COPD, Myocardial Perfusion Study  History Comments: 2006 nl [facility]                                                    H/o atrial tach.                                                03/26/10 [facility] - Dizziness,palps,dehydration  Symptoms: Chest Pain  Symptoms Comments: CP radiating to back   Nuclear Pre-Procedure Cardiac Risk Factors: Family History - CAD, Hypertension Caffeine/Decaff Intake: None NPO After: 8:00 PM Lungs: clear IV 0.9% NS with Angio Cath: 24g     IV Site: Rt Arm IV Started by: Bonnita Levan, RN Chest Size (in) 34     Cup Size A     Height (in): 64 Weight (lb): 118 BMI: 20.33  Nuclear Med Study 1 or 2 day study:  1 day     Stress Test Type:  Eugenie Birks Reading MD:  Marca Ancona, MD     Referring MD:  P.Jordan Resting Radionuclide:  Technetium 60m Tetrofosmin     Resting Radionuclide Dose:  10 mCi  Stress Radionuclide:  Technetium 30m Tetrofosmin     Stress Radionuclide Dose:  33 mCi   Stress Protocol   Lexiscan: 0.4 mg   Stress Test Technologist:  Milana Na, EMT-P     Nuclear Technologist:  Doyne Keel, CNMT  Rest Procedure  Myocardial perfusion imaging was performed at rest 45 minutes following the intravenous administration of Technetium 67m Tetrofosmin.  Stress Procedure  The patient received IV Lexiscan 0.4 mg over 15-seconds.  Technetium 21m Tetrofosmin injected at 30-seconds.  There were no significant changes with infusion.  Quantitative spect images were obtained after a 45 minute delay.  QPS Raw Data Images:  Normal; no motion artifact; normal heart/lung ratio. Stress Images:  Normal homogeneous uptake in all areas of the myocardium. Rest Images:  Normal homogeneous uptake in all areas of the myocardium. Subtraction (SDS):  There is no evidence of scar or ischemia. Transient Ischemic  Dilatation:  .90  (Normal <1.22)  Lung/Heart Ratio:  .24  (Normal <0.45)  Quantitative Gated Spect Images QGS EDV:  42 ml QGS ESV:  6 ml QGS EF:  86 % QGS cine images:  Normal wall motion.    Overall Impression  Exercise Capacity: Lexiscan with no exercise. BP Response: Hypotensive blood pressure response. Clinical Symptoms: Fullness in chest.  ECG Impression: Baseline inferolateral ST depression not significantly changed with Lexiscan infusion.  Overall Impression: Normal stress nuclear study.

## 2010-09-21 NOTE — Assessment & Plan Note (Signed)
Summary: Pulmonary OV   Copy to:  [place] [facility] Primary Provider/Referring Provider:  Herb Grays  CC:  2 wk follow up.  Pt states she is better but still coughing, sore throat, and right ear uncomfortable.  Cough is prod with slight yellow mucus in the mornings. .  History of Present Illness: 60yo WF with cyclic cough and VCD with GERD and upper airway instability.  No true asthma.  Methacholine Challenge 11/09 Neg for reactive airway disease   February 04, 2010  Pt seen May6/11.  Pt had otitis media and pharyngitis and was rx with suprax. Strep screen was neg. Pt improved until one week ago then worse.   Now:  more dyspnea, throat hurts to talk, headache, cough loose rattle, not as much the dry barky cough. "donkey " cough is better.  Hoarseness is better.  Ears are slightly full but not painful.   Cough is productive in the am of thick yellow. Start Suprax one twice daily Veramyst/flonase two sprays each nostril daily Resume tussicaps one twice daily as needed for severe cough A depomedrol 120mg  IM  better, then worse mid July, rx abx and tramadol no prednsione  page 2 March 23, 2010 Acute visit.  Pt c/o cough- prod in the am with yellow sputum.  She also c/o "feels like throat closing"- having diff with swallowing x 3 days.  only times she's been better were both on cough suppression either tussicaps or tramdol.  Pt denies any significant sore throat, itching, sneezing,  nasal congestion or excess secretions,  fever, chills, sweats, unintended wt loss, pleuritic or exertional cp, hempoptysis, change in activity tolerance  orthopnea pnd or leg swelling. Pt also denies any obvious fluctuation in symptoms with weather or environmental change or other alleviating or aggravating factors.    March 31, 2010 3:43 PM The pt has had one week of progressive cough and sore throat.  Pt seen by MW 8/2 and rx wtih cough suppressants, no abx.  Pt then fell and sent to ED.  No abx given. WBC elevated.  Pt  dehydrated.  Pt not admitted. CXR was neg.  Pt injured RLE.  Pt still with sore throat. Strep screen neg.  Still coughing in a paroxysmal nature.  Cough is non productive.  There is sinus drainage.  Dr Sherene Sires did a CT sinus that was neg. Pt states tramadol makes the pt dizzy   April 21, 2010 9:21 AM At last ov we rec: Take avelox one daily for 5 days Stay on flonase daily Start Patanase two sprays each nostril twice daily No prednisone Use Delsym OTC as needed for cough, or Mucinex DM twice daily was ok then backwards again Throat is sore and ear hurts on R side. Never had R ear eval. sinus ct was neg.,  Nose is ok.  Notes sneezing.   delsym helps at night  May 04, 2010 8:55 AM The pt is now noting less cough and dyspnea.  The throat is less sore.  There is still R ear pain but this is less. No f/c/s.  The cough is less.  The cough is non productive.  The pt has finished the ABX. Pt denies any significant sore throat, nasal congestion or excess secretions, fever, chills, sweats, unintended weight loss, pleurtic or exertional chest pain, orthopnea PND, or leg swelling Pt denies any increase in rescue therapy over baseline, denies waking up needing it or having any early am or nocturnal exacerbations of coughing/wheezing/or dyspnea.   Preventive Screening-Counseling &  Management  Alcohol-Tobacco     Alcohol drinks/day: occassionally     Alcohol type: beer     Smoking Status: never     Passive Smoke Exposure: yes  Current Medications (verified): 1)  Spironolactone 25 Mg Tabs (Spironolactone) .Marland Kitchen.. 1 By Mouth Daily 2)  Digoxin 0.25 Mg Tabs (Digoxin) .Marland Kitchen.. 1 By Mouth Daily 3)  Metoprolol Tartrate 25 Mg Tabs (Metoprolol Tartrate) .... Once Daily 4)  Lasix 20 Mg Tabs (Furosemide) .Marland Kitchen.. 1 By Mouth Daily and As Needed 5)  Amitriptyline Hcl 50 Mg Tabs (Amitriptyline Hcl) .Marland Kitchen.. 1 By Mouth Daily 6)  Kapidex 60 Mg Cpdr (Dexlansoprazole) .... Take  One 30-60 Min Before First Meal of The  Day 7)  Excedrin Migraine 250-250-65 Mg Tabs (Aspirin-Acetaminophen-Caffeine) .... As Needed 8)  Tylenol Arthritis Pain 650 Mg Cr-Tabs (Acetaminophen) .... As Needed 9)  Synthroid 75 Mcg Tabs (Levothyroxine Sodium) .Marland Kitchen.. 1 Once Daily 10)  Ranitidine Hcl 300 Mg Caps (Ranitidine Hcl) .... Take 1 Tablet By Mouth Once A Day At Bedtime 11)  Alprazolam 0.25 Mg Tabs (Alprazolam) .... As Needed 12)  Cyclobenzaprine Hcl 10 Mg Tabs (Cyclobenzaprine Hcl) .... Take 1/2-1 Tab By Mouth At Bedtime As Needed 13)  Delsym 30 Mg/2ml Lqcr (Dextromethorphan Polistirex) .... As Needed 14)  Fluticasone Propionate 50 Mcg/act Susp (Fluticasone Propionate) .... Two Sprays Each Nostril Daily 15)  Patanase 0.6 % Soln (Olopatadine Hcl) .... Two Sprays Each Nostril Twice Daily Pt Failed Astelin/astepro 16)  Clarinex-D 24 Hour 5-240 Mg Xr24h-Tab (Desloratadine-Pseudoephedrine) .... One By Mouth Daily  Allergies (verified): 1)  ! Codeine 2)  ! Erythromycin 3)  ! Sulfa 4)  ! Tramadol Hcl  Past History:  Past medical, surgical, family and social histories (including risk factors) reviewed for relevance to current acute and chronic problems.  Past Medical History: Reviewed history from 03/23/2010 and no changes required. FIBROMYALGIA (ICD-729.1) OSTEOARTHRITIS (ICD-715.90) HYPERTENSION (ICD-401.9) VASOMOTOR RHINITIS (ICD-477.9) exacerbates VCD  ASTHMA (ICD-493.90)--->No true asthma    -normal spirometry 2009 FeV1>90%predicted    -MethaCholine challenge neg 11/09 Vocal cord dysfunction proven on FOB 9/09 Chronic cough      - Sinus ct March 23, 2010 >>>      - Allergy profile March 23, 2010 >>>      - Flutter valve rx March 23, 2010   GERD exacerbating VCD Hyperthyroidism with hot nodule     -total thyroidectomy 11/05/08  benign  Past Surgical History: Reviewed history from 05/01/2009 and no changes required. hysterectomy 1980  Basal cell ca 11/08 Total thyroidectomy 11/05/08  Family History: Reviewed  history from 04/18/2008 and no changes required. Family History Asthma--mother and granddaughter Family History Emphysema -- father Heart disease--mother and father  Social History: Reviewed history from 05/01/2009 and no changes required. 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   Review of Systems       The patient complains of non-productive cough.  The patient denies shortness of breath with activity, shortness of breath at rest, productive cough, coughing up blood, chest pain, irregular heartbeats, acid heartburn, indigestion, loss of appetite, weight change, abdominal pain, difficulty swallowing, sore throat, tooth/dental problems, headaches, nasal congestion/difficulty breathing through nose, sneezing, itching, ear ache, anxiety, depression, hand/feet swelling, joint stiffness or pain, rash, change in color of mucus, and fever.    Vital Signs:  Patient profile:   60 year old female Height:      64 inches Weight:      117.25 pounds BMI:  20.20 O2 Sat:      100 % on Room air Temp:     97.7 degrees F oral Pulse rate:   73 / minute BP sitting:   112 / 74  (right arm) Cuff size:   regular  Vitals Entered By: Gweneth Dimitri RN (May 04, 2010 8:50 AM)  O2 Flow:  Room air CC: 2 wk follow up.  Pt states she is better but still coughing, sore throat, right ear uncomfortable.  Cough is prod with slight yellow mucus in the mornings.  Comments Medications reviewed with patient Daytime contact number verified with patient. Gweneth Dimitri RN  May 04, 2010 8:50 AM    Physical Exam  Additional Exam:  anxious amb wf depressed affect, barking honking quality upper airway cough Heent:  no jvd, no TMG, no cervical LNademopathy, orophyx clear, resolution of purulence in R tonsil,  pharyngitis resolved Cor: RRR nl s1/s2  no s3/s4  no m r h g Abd: soft NT BSA   no masses  No HSM  no rebound or guarding Ext perfused with no c v e v.d Neuro: intact, moves  all 4s, CN II-XII intact, DTRs intact Chest: less  prominent pseudowheeze persists, no cough on insp or exp maneuvers skin clear     Impression & Recommendations:  Problem # 1:  PHARYNGITIS, ACUTE (ICD-462) Assessment New No further ABX indicated, resolved tonsillitis/pharyngitis Her updated medication list for this problem includes:    Tylenol Arthritis Pain 650 Mg Cr-tabs (Acetaminophen) .Marland Kitchen... As needed  Orders: Est. Patient Level III ([address]109)  Problem # 2:  COUGH (ICD-786.2)  Severe cyclical cough, vocal cord dysfunction exacerbated by recent pharyngitis plan as needed delsym keep jolly ranchers in throat  Medications Added to Medication List This Visit: 1)  Clarinex 5 Mg Tabs (Desloratadine) .... One tablet by mouth daily  Complete Medication List: 1)  Spironolactone 25 Mg Tabs (Spironolactone) .Marland Kitchen.. 1 by mouth daily 2)  Digoxin 0.25 Mg Tabs (Digoxin) .Marland Kitchen.. 1 by mouth daily 3)  Metoprolol Tartrate 25 Mg Tabs (Metoprolol tartrate) .... Once daily 4)  Lasix 20 Mg Tabs (Furosemide) .Marland Kitchen.. 1 by mouth daily and as needed 5)  Amitriptyline Hcl 50 Mg Tabs (Amitriptyline hcl) .Marland Kitchen.. 1 by mouth daily 6)  Kapidex 60 Mg Cpdr (Dexlansoprazole) .... Take  one 30-60 min before first meal of the day 7)  Excedrin Migraine 250-250-65 Mg Tabs (Aspirin-acetaminophen-caffeine) .... As needed 8)  Tylenol Arthritis Pain 650 Mg Cr-tabs (Acetaminophen) .... As needed 9)  Synthroid 75 Mcg Tabs (Levothyroxine sodium) .Marland Kitchen.. 1 once daily 10)  Ranitidine Hcl 300 Mg Caps (Ranitidine hcl) .... Take 1 tablet by mouth once a day at bedtime 11)  Alprazolam 0.25 Mg Tabs (Alprazolam) .... As needed 12)  Cyclobenzaprine Hcl 10 Mg Tabs (Cyclobenzaprine hcl) .... Take 1/2-1 tab by mouth at bedtime as needed 13)  Delsym 30 Mg/37ml Lqcr (Dextromethorphan polistirex) .... As needed 14)  Fluticasone Propionate 50 Mcg/act Susp (Fluticasone propionate) .... Two sprays each nostril daily 15)  Patanase 0.6 % Soln  (Olopatadine hcl) .... Two sprays each nostril twice daily pt failed astelin/astepro 16)  Clarinex 5 Mg Tabs (Desloratadine) .... One tablet by mouth daily  Patient Instructions: 1)  No further antibiotics 2)  Clarinex one daily 3)  Stay on flonase daily 4)  Reflux diet, stay on Dexilant and ranitidine 5)  Remember to keep jolly rancher in mouth 6)  Delsym as needed for cough 7)  Return 2 months    Appended Document: Pulmonary  OV fax tammy spear

## 2010-09-21 NOTE — Assessment & Plan Note (Signed)
Summary: Pulmonary OV   Copy to:  [place] Callas Primary Provider/Referring Provider:  Herb Grays  CC:  2 week follow up. pt c/o of sore throat, hoarseness, right ear pain, cough with clear phlem, wheezing, and sore chest from coughing.  pt states cough was better unitl sunday. Marland Kitchen  History of Present Illness: 59yo WF with cyclic cough and VCD with GERD and upper airway instability.  No true asthma.  Methacholine Challenge 11/09 Neg for reactive airway disease   February 04, 2010  Pt seen May6/11.  Pt had otitis media and pharyngitis and was rx with suprax. Strep screen was neg. Pt improved until one week ago then worse.   Now:  more dyspnea, throat hurts to talk, headache, cough loose rattle, not as much the dry barky cough. "donkey " cough is better.  Hoarseness is better.  Ears are slightly full but not painful.   Cough is productive in the am of thick yellow. Start Suprax one twice daily Veramyst/flonase two sprays each nostril daily Resume tussicaps one twice daily as needed for severe cough A depomedrol 120mg  IM  better, then worse mid July, rx abx and tramadol no prednsione  page 2 March 23, 2010 Acute visit.  Pt c/o cough- prod in the am with yellow sputum.  She also c/o "feels like throat closing"- having diff with swallowing x 3 days.  only times she's been better were both on cough suppression either tussicaps or tramdol.  Pt denies any significant sore throat, itching, sneezing,  nasal congestion or excess secretions,  fever, chills, sweats, unintended wt loss, pleuritic or exertional cp, hempoptysis, change in activity tolerance  orthopnea pnd or leg swelling. Pt also denies any obvious fluctuation in symptoms with weather or environmental change or other alleviating or aggravating factors.    March 31, 2010 3:43 PM The pt has had one week of progressive cough and sore throat.  Pt seen by MW 8/2 and rx wtih cough suppressants, no abx.  Pt then fell and sent to ED.  No abx given. WBC  elevated.  Pt dehydrated.  Pt not admitted. CXR was neg.  Pt injured RLE.  Pt still with sore throat. Strep screen neg.  Still coughing in a paroxysmal nature.  Cough is non productive.  There is sinus drainage.  Dr Sherene Sires did a CT sinus that was neg. Pt states tramadol makes the pt dizzy   April 21, 2010 9:21 AM At last ov we rec: Take avelox one daily for 5 days Stay on flonase daily Start Patanase two sprays each nostril twice daily No prednisone Use Delsym OTC as needed for cough, or Mucinex DM twice daily was ok then backwards again Throat is sore and ear hurts on R side. Never had R ear eval. sinus ct was neg.,  Nose is ok.  Notes sneezing.   delsym helps at night  PAGE 3 >>  May 04, 2010 8:55 AM The pt is now noting less cough and dyspnea.  The throat is less sore.  There is still R ear pain but this is less. No f/c/s.  The cough is less.  The cough is non productive.  The pt has finished the ABX. Pt denies any significant sore throat, nasal congestion or excess secretions, fever, chills, sweats, unintended weight loss, pleurtic or exertional chest pain, orthopnea PND, or leg swelling Pt denies any increase in rescue therapy over baseline, denies waking up needing it or having any early am or nocturnal exacerbations of coughing/wheezing/or dyspnea.  May 27, 2010--Presents for an acute office visit. Complains of headache, , sore throat, right ear discomfort, increased dyspnea,  prod cough with light yellow mucus x4days . OTC not helping. Cough keeps coming back. Waxes and wanes. Gets a little better but never totally goes away. The last 4 days cough has been really bad, Mainly just dry cough. No fever.  She is using delsym with out much help. Denies chest pain,  orthopnea, hemoptysis, fever, n/v/d, edema, headache.   June 15, 2010 9:38 AM feels better.  then started back with coughing and more throat sore.  granddaughter with sore throat, cough is dry now voice is worse.    R ear hurts,  notes some pndrip   Current Medications (verified): 1)  Spironolactone 25 Mg Tabs (Spironolactone) .Marland Kitchen.. 1 By Mouth Daily 2)  Digoxin 0.25 Mg Tabs (Digoxin) .Marland Kitchen.. 1 By Mouth Daily 3)  Metoprolol Tartrate 25 Mg Tabs (Metoprolol Tartrate) .... Once Daily 4)  Lasix 20 Mg Tabs (Furosemide) .Marland Kitchen.. 1 By Mouth Daily and As Needed 5)  Amitriptyline Hcl 50 Mg Tabs (Amitriptyline Hcl) .Marland Kitchen.. 1 By Mouth Daily 6)  Kapidex 60 Mg Cpdr (Dexlansoprazole) .... Take  One 30-60 Min Before First Meal of The Day 7)  Excedrin Migraine 250-250-65 Mg Tabs (Aspirin-Acetaminophen-Caffeine) .... As Needed 8)  Tylenol Arthritis Pain 650 Mg Cr-Tabs (Acetaminophen) .... As Needed 9)  Synthroid 75 Mcg Tabs (Levothyroxine Sodium) .Marland Kitchen.. 1 Once Daily 10)  Ranitidine Hcl 300 Mg Caps (Ranitidine Hcl) .... Take 1 Tablet By Mouth Once A Day At Bedtime 11)  Alprazolam 0.25 Mg Tabs (Alprazolam) .... As Needed 12)  Cyclobenzaprine Hcl 10 Mg Tabs (Cyclobenzaprine Hcl) .... Take 1/2-1 Tab By Mouth At Bedtime As Needed 13)  Delsym 30 Mg/44ml Lqcr (Dextromethorphan Polistirex) .... As Needed 14)  Fluticasone Propionate 50 Mcg/act Susp (Fluticasone Propionate) .... Two Sprays Each Nostril Daily As Needed 15)  Tessalon 200 Mg Caps (Benzonatate) .Marland Kitchen.. 1 By Mouth Three Times A Day As Needed Cough 16)  Hydromet 5-1.5 Mg/65ml Syrp (Hydrocodone-Homatropine) .Marland Kitchen.. 1-2 Tsp Every 4-6 Hr As Needed Cough  Allergies (verified): 1)  ! Codeine 2)  ! Erythromycin 3)  ! Sulfa 4)  ! Tramadol Hcl  Past History:  Past medical, surgical, family and social histories (including risk factors) reviewed, and no changes noted (except as noted below).  Past Medical History: Reviewed history from 03/23/2010 and no changes required. FIBROMYALGIA (ICD-729.1) OSTEOARTHRITIS (ICD-715.90) HYPERTENSION (ICD-401.9) VASOMOTOR RHINITIS (ICD-477.9) exacerbates VCD  ASTHMA (ICD-493.90)--->No true asthma    -normal spirometry 2009 FeV1>90%predicted     -MethaCholine challenge neg 11/09 Vocal cord dysfunction proven on FOB 9/09 Chronic cough      - Sinus ct March 23, 2010 >>>      - Allergy profile March 23, 2010 >>>      - Flutter valve rx March 23, 2010   GERD exacerbating VCD Hyperthyroidism with hot nodule     -total thyroidectomy 11/05/08  benign  Past Surgical History: Reviewed history from 05/01/2009 and no changes required. hysterectomy 1980  Basal cell ca 11/08 Total thyroidectomy 11/05/08  Family History: Reviewed history from 04/18/2008 and no changes required. Family History Asthma--mother and granddaughter Family History Emphysema -- father Heart disease--mother and father  Social History: Reviewed history from 05/01/2009 and no changes required. 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   Review of Systems       The patient complains of productive cough, non-productive cough, chest pain, sore  throat, and nasal congestion/difficulty breathing through nose.  The patient denies shortness of breath with activity, shortness of breath at rest, coughing up blood, irregular heartbeats, acid heartburn, indigestion, loss of appetite, weight change, abdominal pain, difficulty swallowing, tooth/dental problems, headaches, sneezing, itching, ear ache, anxiety, depression, hand/feet swelling, joint stiffness or pain, rash, change in color of mucus, and fever.    Vital Signs:  Patient profile:   60 year old female Height:      64 inches Weight:      118.25 pounds BMI:     20.37 O2 Sat:      100 % on Room air Temp:     97.5 degrees F oral Pulse rate:   71 / minute BP sitting:   104 / 64  (left arm) Cuff size:   regular  Vitals Entered By: Carver Fila (June 15, 2010 9:24 AM)  O2 Flow:  Room air CC: 2 week follow up. pt c/o of sore throat, hoarseness, right ear pain, cough with clear phlem, wheezing, sore chest from coughing.  pt states cough was better unitl sunday.  Comments meds and allergies  updated Phone number updated Carver Fila  June 15, 2010 9:24 AM    Physical Exam  Additional Exam:  anxious amb wf depressed affect, barking harsh  quality upper airway cough Heent:  no jvd, no TMG, no cervical LNademopathy, orophyx clear,  R TM clear, mild pharyngeal erythema Cor: RRR nl s1/s2  no s3/s4  no m r h g Abd: soft NT BSA   no masses  No HSM  no rebound or guarding Ext perfused with no c v e v.d Neuro: intact, moves all 4s, CN II-XII intact, DTRs intact Chest: coarse BS with upper airway psuedowheezing    Impression & Recommendations:  Problem # 1:  PHARYNGITIS, ACUTE (ICD-462) Assessment Deteriorated recurrent acute pharyngitis with serous otitis media on R due to fluid build up from blocked eustachian tube plan augmentin x 7days decongestent Her updated medication list for this problem includes:    Tylenol Arthritis Pain 650 Mg Cr-tabs (Acetaminophen) .Marland Kitchen... As needed    Augmentin 875-125 Mg Tabs (Amoxicillin-pot clavulanate) ..... By mouth twice daily  Orders: Est. Patient Level IV (61950)  Medications Added to Medication List This Visit: 1)  Fluticasone Propionate 50 Mcg/act Susp (Fluticasone propionate) .... Two sprays each nostril daily as needed 2)  Dallergy Pe 8-20-2.5 Mg Xr12h-tab (Chlorphen-phenyleph-methscop) .... One by mouth two times a day as needed ear pain, nasal congestion 3)  Augmentin 875-125 Mg Tabs (Amoxicillin-pot clavulanate) .... By mouth twice daily  Complete Medication List: 1)  Spironolactone 25 Mg Tabs (Spironolactone) .Marland Kitchen.. 1 by mouth daily 2)  Digoxin 0.25 Mg Tabs (Digoxin) .Marland Kitchen.. 1 by mouth daily 3)  Metoprolol Tartrate 25 Mg Tabs (Metoprolol tartrate) .... Once daily 4)  Lasix 20 Mg Tabs (Furosemide) .Marland Kitchen.. 1 by mouth daily and as needed 5)  Amitriptyline Hcl 50 Mg Tabs (Amitriptyline hcl) .Marland Kitchen.. 1 by mouth daily 6)  Kapidex 60 Mg Cpdr (Dexlansoprazole) .... Take  one 30-60 min before first meal of the day 7)  Excedrin Migraine  250-250-65 Mg Tabs (Aspirin-acetaminophen-caffeine) .... As needed 8)  Tylenol Arthritis Pain 650 Mg Cr-tabs (Acetaminophen) .... As needed 9)  Synthroid 75 Mcg Tabs (Levothyroxine sodium) .Marland Kitchen.. 1 once daily 10)  Ranitidine Hcl 300 Mg Caps (Ranitidine hcl) .... Take 1 tablet by mouth once a day at bedtime 11)  Alprazolam 0.25 Mg Tabs (Alprazolam) .... As needed 12)  Cyclobenzaprine Hcl 10 Mg  Tabs (Cyclobenzaprine hcl) .... Take 1/2-1 tab by mouth at bedtime as needed 13)  Delsym 30 Mg/36ml Lqcr (Dextromethorphan polistirex) .... As needed 14)  Fluticasone Propionate 50 Mcg/act Susp (Fluticasone propionate) .... Two sprays each nostril daily as needed 15)  Tessalon 200 Mg Caps (Benzonatate) .Marland Kitchen.. 1 by mouth three times a day as needed cough 16)  Hydromet 5-1.5 Mg/12ml Syrp (Hydrocodone-homatropine) .Marland Kitchen.. 1-2 tsp every 4-6 hr as needed cough 17)  Dallergy Pe 8-20-2.5 Mg Xr12h-tab (Chlorphen-phenyleph-methscop) .... One by mouth two times a day as needed ear pain, nasal congestion 18)  Augmentin 875-125 Mg Tabs (Amoxicillin-pot clavulanate) .... By mouth twice daily  Patient Instructions: 1)  Try Dallergy PE one twice daily as needed for nasal/ear congestion 2)  Augmentin one twice a day for 7days 3)  As needed delsym or hydromet for cough 4)  Return 2 months , sooner if necessary Prescriptions: AUGMENTIN 875-125 MG  TABS (AMOXICILLIN-POT CLAVULANATE) By mouth twice daily  #14 x 0   Entered and Authorized by:   Storm Frisk MD   Signed by:   Storm Frisk MD on 06/15/2010   Method used:   Electronically to        [facility] # [phone]* (retail)       [address]       [place], [place]  [address]       Ph: [phone]       Fax: [phone]   RxID:   [id number] DALLERGY PE 8-20-2.5 MG XR12H-TAB (CHLORPHEN-PHENYLEPH-METHSCOP) one by mouth two times a day as needed ear pain, nasal congestion  #20 x 1   Entered and Authorized by:   Storm Frisk MD   Signed by:   Storm Frisk MD on 06/15/2010   Method used:   Electronically to        [facility] # [phone]* (retail)       [address]       [place], [place]  [address]       Ph: [phone]       Fax: [phone]   RxID:   [id number]     Appended Document: Pulmonary OV fax tammy spear

## 2010-09-21 NOTE — Progress Notes (Signed)
Summary: Nuc Pre-procedure  Phone Note Outgoing Call   Call placed by: Domenic Polite, CNMT,  April 13, 2010 3:24 PM Call placed to: Patient Reason for Call: Confirm/change Appt Summary of Call: Left message with information on [employer] Information Sheet (see scanned document for details).  Initial call taken by: Domenic Polite, CNMT,  April 13, 2010 3:25 PM     Nuclear Med Background Indications for Stress Test: Evaluation for Ischemia, Post Hospital, Abnormal EKG   History: COPD, Myocardial Perfusion Study  History Comments: 2006 nl [facility]PS                                                    H/o atrial tach.                                                03/26/10 [facility] - Dizziness,palps,dehydration  Symptoms: Chest Pain  Symptoms Comments: CP radiating to back   Nuclear Pre-Procedure Cardiac Risk Factors: Family History - CAD, Hypertension Height (in): 64

## 2010-09-21 NOTE — Assessment & Plan Note (Signed)
Summary: Pulmonary/ acute ex ov > teach  flutter   Copy to:  Hurley Callas Primary Provider/Referring Provider:  Herb Grays  CC:  Acute visit.  Pt c/o cough- prod in the am with yellow sputum.  She also c/o "feels like throat closing"- having diff with swallowing x 3 days.  Marland Kitchen  History of Present Illness: 59yo WF with cyclic cough and VCD with GERD and upper airway instability.  No true asthma.  Methacholine Challenge 11/09 Neg for reactive airway disease  September 11, 2009  Cough came back, worse since 12/25 and colder weather.  This past week much worse. The pt  saw PCP and  no bad bronchitis.  UTI was dx. The pt was dx with strep throat pos in 12/10 and rx abx avelox then per pcp., hydromet was rx as well in 12/10 and has some left The pt  was ok for about 4 weeks then worse. no heartburn.  no pndrip.  the throat is dry  Dec 25, 2009  The pt developed otitis media  and bronchits developed 4/11  and saw NP  and PCP and rx avelox after augmentin did not work.  The pts noted her chronic barky cough was better, but now the bronchitis has aggravated the baseline cough.  The pt was rx with 10d of avelox and has 2 days left.  Now mucous is dry, yellow discoloration is better.  No real pndrip.  The pt has pain in R ear if swallows.  February 04, 2010  Pt seen May6/11.  Pt had otitis media and pharyngitis and was rx with suprax. Strep screen was neg. Pt improved until one week ago then worse.   Now:  more dyspnea, throat hurts to talk, headache, cough loose rattle, not as much the dry barky cough. "donkey " cough is better.  Hoarseness is better.  Ears are slightly full but not painful.   Cough is productive in the am of thick yellow. Start Suprax one twice daily Veramyst/flonase two sprays each nostril daily Resume tussicaps one twice daily as needed for severe cough A depomedrol 120mg  IM  better, then worse mid July, rx abx and tramadol no prednsione  page 2 March 23, 2010 Acute visit.  Pt c/o cough-  prod in the am with yellow sputum.  She also c/o "feels like throat closing"- having diff with swallowing x 3 days.  only times she's been better were both on cough suppression either tussicaps or tramdol.  Pt denies any significant sore throat, itching, sneezing,  nasal congestion or excess secretions,  fever, chills, sweats, unintended wt loss, pleuritic or exertional cp, hempoptysis, change in activity tolerance  orthopnea pnd or leg swelling. Pt also denies any obvious fluctuation in symptoms with weather or environmental change or other alleviating or aggravating factors.       Current Medications (verified): 1)  Spironolactone 25 Mg Tabs (Spironolactone) .Marland Kitchen.. 1 By Mouth Daily 2)  Digoxin 0.25 Mg Tabs (Digoxin) .Marland Kitchen.. 1 By Mouth Daily 3)  Metoprolol Tartrate 25 Mg Tabs (Metoprolol Tartrate) .... Once Daily 4)  Lasix 20 Mg Tabs (Furosemide) .Marland Kitchen.. 1 By Mouth Daily and As Needed 5)  Amitriptyline Hcl 50 Mg Tabs (Amitriptyline Hcl) .Marland Kitchen.. 1 By Mouth Daily 6)  Kapidex 60 Mg Cpdr (Dexlansoprazole) .Marland Kitchen.. 1 By Mouth Daily 7)  Excedrin Migraine 250-250-65 Mg Tabs (Aspirin-Acetaminophen-Caffeine) .... As Needed 8)  Tylenol Arthritis Pain 650 Mg Cr-Tabs (Acetaminophen) .... As Needed 9)  Synthroid 75 Mcg Tabs (Levothyroxine Sodium) .Marland Kitchen.. 1 Once  Daily 10)  Ranitidine Hcl 300 Mg Caps (Ranitidine Hcl) .... Take 1 Tablet By Mouth Once A Day At Bedtime 11)  Alprazolam 0.25 Mg Tabs (Alprazolam) .... As Needed 12)  Tessalon Perles 100 Mg  Caps (Benzonatate) .... One To Two By Mouth 3-4 Times Daily As Needed Cough 13)  Cyclobenzaprine Hcl 10 Mg Tabs (Cyclobenzaprine Hcl) .... Take 1/2-1 Tab By Mouth At Bedtime As Needed 14)  Delsym 30 Mg/21ml Lqcr (Dextromethorphan Polistirex) .... As Needed 15)  Tussicaps 10-8 Mg Xr12h-Cap (Hydrocod Polst-Chlorphen Polst) .... Take 1 Capsule By Mouth Two Times A Day As Needed 16)  Fluticasone Propionate 50 Mcg/act Susp (Fluticasone Propionate) .... Two Sprays Each Nostril Daily 17)   Tramadol Hcl 50 Mg Tabs (Tramadol Hcl) .Marland Kitchen.. 1 Every 6 Hours As Needed  Allergies (verified): 1)  ! Codeine 2)  ! Erythromycin 3)  ! Sulfa  Past History:  Past Medical History: FIBROMYALGIA (ICD-729.1) OSTEOARTHRITIS (ICD-715.90) HYPERTENSION (ICD-401.9) VASOMOTOR RHINITIS (ICD-477.9) exacerbates VCD  ASTHMA (ICD-493.90)--->No true asthma    -normal spirometry 2009 FeV1>90%predicted    -MethaCholine challenge neg 11/09 Vocal cord dysfunction proven on FOB 9/09 Chronic cough      - Sinus ct March 23, 2010 >>>      - Allergy profile March 23, 2010 >>>      - Flutter valve rx March 23, 2010   GERD exacerbating VCD Hyperthyroidism with hot nodule     -total thyroidectomy 11/05/08  benign  Vital Signs:  Patient profile:   60 year old female Weight:      117 pounds O2 Sat:      98 % on Room air Temp:     98.5 degrees F oral Pulse rate:   78 / minute BP sitting:   112 / 66  (left arm)  Vitals Entered By: Vernie Murders (March 23, 2010 3:49 PM)  O2 Flow:  Room air  Physical Exam  Additional Exam:  anxious amb wf depressed affect, barking honking quality upper airway cough Heent:  no jvd, no TMG, no cervical LNademopathy, orophyx clear,  mild postnasal drip,  mild  nares purulence, both TMs clear  Cor: RRR nl s1/s2  no s3/s4  no m r h g Abd: soft NT BSA   no masses  No HSM  no rebound or guarding Ext perfused with no c v e v.d Neuro: intact, moves all 4s, CN II-XII intact, DTRs intact Chest: clear; prominent pseudowheeze persists, no cough on insp or exp maneuvers skin clear     Impression & Recommendations:  Problem # 1:  ACUTE BRONCHITIS (ICD-466.0)  The following medications were removed from the medication list:    Tessalon Perles 100 Mg Caps (Benzonatate) ..... One to two by mouth 3-4 times daily as needed cough    Tussicaps 10-8 Mg Xr12h-cap (Hydrocod polst-chlorphen polst) .Marland Kitchen... Take 1 capsule by mouth two times a day as needed Her updated medication list  for this problem includes:    Delsym 30 Mg/60ml Lqcr (Dextromethorphan polistirex) .Marland Kitchen... As needed  Orders: TLB-CBC Platelet - w/Differential (85025-[facility]) T-Allergy Profile [facility], [place], [place], [place], [place] [id number]) Misc. Referral (Misc. Ref) Est. Patient Level IV (19147) Flutter Valve (82956)  only responding to cough suppressants now.  Of the three most common causes of chronic cough, only one can actually cause the other two and perpetuate the cylce of cough inducing airway trauma, inflammation, heightened sensitivity to reflux which is prompted by the cough itself via a cyclical mechanism.  This may partially respond to steroids and look like asthma and post nasal drainage but never erradicated completely unless the cough and the secondary reflux are eliminated, preferably both at the same time.  so ok to use cough suppression but should be short term only  Try max gerd plus diet (still using cough drops) plus flutter. consider neurontin trial  Each maintenance medication was reviewed in detail including most importantly the difference between maintenance and as needed and under what circumstances the prns are to be used.  See instructions for specific recommendations   Medications Added to Medication List This Visit: 1)  Kapidex 60 Mg Cpdr (Dexlansoprazole) .... Take  one 30-60 min before first meal of the day 2)  Synthroid 75 Mcg Tabs (Levothyroxine sodium) .Marland Kitchen.. 1 once daily 3)  Tramadol Hcl 50 Mg Tabs (Tramadol hcl) .Marland Kitchen.. 1 every 6 hours as needed 4)  *flutter Valve   Patient Instructions: 1)  GERD (REFLUX)  is a common cause of respiratory symptoms. It commonly presents without heartburn and can be treated with medication, but also with lifestyle changes including avoidance of late meals, excessive alcohol, smoking cessation, and avoid fatty foods, chocolate, peppermint, colas, red wine, and acidic juices such as orange juice. NO MINT OR MENTHOL PRODUCTS SO NO COUGH DROPS  2)  USE SUGARLESS  CANDY INSTEAD (jolley ranchers)  3)  NO OIL BASED VITAMINS  4)  Take delsym two tsp every 12 hours and add tramadol 50 mg up to 2 every 4 hours to suppress the urge to cough until no longer coughing at all. Swallowing water or using ice chips/non mint and menthol containing candies (such as lifesavers or sugarless jolly ranchers) are also effective.  5)  Use flutter valve 6)  See Patient Care Coordinator before leaving for sinus ct 7)  See Dr Delford Field in one week, sooner if needed 8)  consider neurontin trial  Appended Document: Pulmonary/ acute ex ov > teach  flutter thank you

## 2010-09-21 NOTE — Assessment & Plan Note (Signed)
Summary: Pulmonary OV   Copy to:  [place] Callas Primary Provider/Referring Provider:  Herb Grays  CC:  1 week follow up.  Pt stats she feels like she is working harder to breath x 1 wk.  Still coughing some-nonprod.  Swollowing also better but still having trouble.Marland Kitchen  History of Present Illness: 59yo WF with cyclic cough and VCD with GERD and upper airway instability.  No true asthma.  Methacholine Challenge 11/09 Neg for reactive airway disease  September 11, 2009  Cough came back, worse since 12/25 and colder weather.  This past week much worse. The pt  saw PCP and  no bad bronchitis.  UTI was dx. The pt was dx with strep throat pos in 12/10 and rx abx avelox then per pcp., hydromet was rx as well in 12/10 and has some left The pt  was ok for about 4 weeks then worse. no heartburn.  no pndrip.  the throat is dry  Dec 25, 2009  The pt developed otitis media  and bronchits developed 4/11  and saw NP  and PCP and rx avelox after augmentin did not work.  The pts noted her chronic barky cough was better, but now the bronchitis has aggravated the baseline cough.  The pt was rx with 10d of avelox and has 2 days left.  Now mucous is dry, yellow discoloration is better.  No real pndrip.  The pt has pain in R ear if swallows.  February 04, 2010  Pt seen May6/11.  Pt had otitis media and pharyngitis and was rx with suprax. Strep screen was neg. Pt improved until one week ago then worse.   Now:  more dyspnea, throat hurts to talk, headache, cough loose rattle, not as much the dry barky cough. "donkey " cough is better.  Hoarseness is better.  Ears are slightly full but not painful.   Cough is productive in the am of thick yellow. Start Suprax one twice daily Veramyst/flonase two sprays each nostril daily Resume tussicaps one twice daily as needed for severe cough A depomedrol 120mg  IM  better, then worse mid July, rx abx and tramadol no prednsione  page 2 March 23, 2010 Acute visit.  Pt c/o cough- prod in  the am with yellow sputum.  She also c/o "feels like throat closing"- having diff with swallowing x 3 days.  only times she's been better were both on cough suppression either tussicaps or tramdol.  Pt denies any significant sore throat, itching, sneezing,  nasal congestion or excess secretions,  fever, chills, sweats, unintended wt loss, pleuritic or exertional cp, hempoptysis, change in activity tolerance  orthopnea pnd or leg swelling. Pt also denies any obvious fluctuation in symptoms with weather or environmental change or other alleviating or aggravating factors.    March 31, 2010 3:43 PM The pt has had one week of progressive cough and sore throat.  Pt seen by MW 8/2 and rx wtih cough suppressants, no abx.  Pt then fell and sent to ED.  No abx given. WBC elevated.  Pt dehydrated.  Pt not admitted. CXR was neg.  Pt injured RLE.  Pt still with sore throat. Strep screen neg.  Still coughing in a paroxysmal nature.  Cough is non productive.  There is sinus drainage.  Dr Sherene Sires did a CT sinus that was neg. Pt states tramadol makes the pt dizzy    Preventive Screening-Counseling & Management  Alcohol-Tobacco     Alcohol drinks/day: occassionally     Alcohol  type: beer     Smoking Status: never     Passive Smoke Exposure: yes  Current Medications (verified): 1)  Spironolactone 25 Mg Tabs (Spironolactone) .Marland Kitchen.. 1 By Mouth Daily 2)  Digoxin 0.25 Mg Tabs (Digoxin) .Marland Kitchen.. 1 By Mouth Daily 3)  Metoprolol Tartrate 25 Mg Tabs (Metoprolol Tartrate) .... Once Daily 4)  Lasix 20 Mg Tabs (Furosemide) .Marland Kitchen.. 1 By Mouth Daily and As Needed 5)  Amitriptyline Hcl 50 Mg Tabs (Amitriptyline Hcl) .Marland Kitchen.. 1 By Mouth Daily 6)  Kapidex 60 Mg Cpdr (Dexlansoprazole) .... Take  One 30-60 Min Before First Meal of The Day 7)  Excedrin Migraine 250-250-65 Mg Tabs (Aspirin-Acetaminophen-Caffeine) .... As Needed 8)  Tylenol Arthritis Pain 650 Mg Cr-Tabs (Acetaminophen) .... As Needed 9)  Synthroid 75 Mcg Tabs (Levothyroxine  Sodium) .Marland Kitchen.. 1 Once Daily 10)  Ranitidine Hcl 300 Mg Caps (Ranitidine Hcl) .... Take 1 Tablet By Mouth Once A Day At Bedtime 11)  Alprazolam 0.25 Mg Tabs (Alprazolam) .... As Needed 12)  Cyclobenzaprine Hcl 10 Mg Tabs (Cyclobenzaprine Hcl) .... Take 1/2-1 Tab By Mouth At Bedtime As Needed 13)  Delsym 30 Mg/63ml Lqcr (Dextromethorphan Polistirex) .... As Needed 14)  Fluticasone Propionate 50 Mcg/act Susp (Fluticasone Propionate) .... Two Sprays Each Nostril Daily 15)  Tramadol Hcl 50 Mg Tabs (Tramadol Hcl) .Marland Kitchen.. 1 Every 6 Hours As Needed 16)  *flutter Valve  Allergies (verified): 1)  ! Codeine 2)  ! Erythromycin 3)  ! Sulfa 4)  ! Tramadol Hcl  Past History:  Past medical, surgical, family and social histories (including risk factors) reviewed, and no changes noted (except as noted below).  Past Medical History: Reviewed history from 03/23/2010 and no changes required. FIBROMYALGIA (ICD-729.1) OSTEOARTHRITIS (ICD-715.90) HYPERTENSION (ICD-401.9) VASOMOTOR RHINITIS (ICD-477.9) exacerbates VCD  ASTHMA (ICD-493.90)--->No true asthma    -normal spirometry 2009 FeV1>90%predicted    -MethaCholine challenge neg 11/09 Vocal cord dysfunction proven on FOB 9/09 Chronic cough      - Sinus ct March 23, 2010 >>>      - Allergy profile March 23, 2010 >>>      - Flutter valve rx March 23, 2010   GERD exacerbating VCD Hyperthyroidism with hot nodule     -total thyroidectomy 11/05/08  benign  Past Surgical History: Reviewed history from 05/01/2009 and no changes required. hysterectomy 1980  Basal cell ca 11/08 Total thyroidectomy 11/05/08  Family History: Reviewed history from 04/18/2008 and no changes required. Family History Asthma--mother and granddaughter Family History Emphysema -- father Heart disease--mother and father  Social History: Reviewed history from 05/01/2009 and no changes required. 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   Review of  Systems       The patient complains of shortness of breath with activity, non-productive cough, and nasal congestion/difficulty breathing through nose.  The patient denies shortness of breath at rest, productive cough, coughing up blood, chest pain, irregular heartbeats, acid heartburn, indigestion, loss of appetite, weight change, abdominal pain, difficulty swallowing, sore throat, tooth/dental problems, headaches, sneezing, itching, ear ache, anxiety, depression, hand/feet swelling, joint stiffness or pain, rash, change in color of mucus, and fever.    Vital Signs:  Patient profile:   60 year old female Height:      64 inches Weight:      119 pounds BMI:     20.50 O2 Sat:      100 % on Room air Temp:     98.2 degrees F oral Pulse rate:  87 / minute BP sitting:   108 / 70  (left arm) Cuff size:   regular  Vitals Entered By: Gweneth Dimitri RN (March 31, 2010 3:25 PM)  O2 Flow:  Room air CC: 1 week follow up.  Pt stats she feels like she is working harder to breath x 1 wk.  Still coughing some-nonprod.  Swollowing also better but still having trouble. Comments Medications reviewed with patient Daytime contact number verified with patient. Gweneth Dimitri RN  March 31, 2010 3:24 PM    Physical Exam  Additional Exam:  anxious amb wf depressed affect, barking honking quality upper airway cough Heent:  no jvd, no TMG, no cervical LNademopathy, orophyx clear,  mild postnasal drip,  mild  nares purulence, both TMs clear  Cor: RRR nl s1/s2  no s3/s4  no m r h g Abd: soft NT BSA   no masses  No HSM  no rebound or guarding Ext perfused with no c v e v.d Neuro: intact, moves all 4s, CN II-XII intact, DTRs intact Chest: clear; prominent pseudowheeze persists, no cough on insp or exp maneuvers skin clear     CT of Sinus  Procedure date:  03/24/2010  Findings:      IMPRESSION: Normal paranasal sinuses.  Impression & Recommendations:  Problem # 1:  COUGH (ICD-786.2) Assessment  Deteriorated Severe cyclical cough, vocal cord dysfunction,  nasal purulence but neg CT sinus for severe sinus disease. plan avelox for 5 days  no steroids trial patanase nasal spray use delsym otc as needed cough stay on flonase stop tramadol Orders: Est. Patient Level IV ([address])  Medications Added to Medication List This Visit: 1)  Avelox 400 Mg Tabs (Moxifloxacin hcl) .... By mouth daily 2)  Patanase 0.6 % Soln (Olopatadine hcl) .... Two sprays each nostril twice daily pt failed astelin/astepro  Complete Medication List: 1)  Spironolactone 25 Mg Tabs (Spironolactone) .Marland Kitchen.. 1 by mouth daily 2)  Digoxin 0.25 Mg Tabs (Digoxin) .Marland Kitchen.. 1 by mouth daily 3)  Metoprolol Tartrate 25 Mg Tabs (Metoprolol tartrate) .... Once daily 4)  Lasix 20 Mg Tabs (Furosemide) .Marland Kitchen.. 1 by mouth daily and as needed 5)  Amitriptyline Hcl 50 Mg Tabs (Amitriptyline hcl) .Marland Kitchen.. 1 by mouth daily 6)  Kapidex 60 Mg Cpdr (Dexlansoprazole) .... Take  one 30-60 min before first meal of the day 7)  Excedrin Migraine 250-250-65 Mg Tabs (Aspirin-acetaminophen-caffeine) .... As needed 8)  Tylenol Arthritis Pain 650 Mg Cr-tabs (Acetaminophen) .... As needed 9)  Synthroid 75 Mcg Tabs (Levothyroxine sodium) .Marland Kitchen.. 1 once daily 10)  Ranitidine Hcl 300 Mg Caps (Ranitidine hcl) .... Take 1 tablet by mouth once a day at bedtime 11)  Alprazolam 0.25 Mg Tabs (Alprazolam) .... As needed 12)  Cyclobenzaprine Hcl 10 Mg Tabs (Cyclobenzaprine hcl) .... Take 1/2-1 tab by mouth at bedtime as needed 13)  Delsym 30 Mg/6ml Lqcr (Dextromethorphan polistirex) .... As needed 14)  Fluticasone Propionate 50 Mcg/act Susp (Fluticasone propionate) .... Two sprays each nostril daily 15)  *flutter Valve  16)  Avelox 400 Mg Tabs (Moxifloxacin hcl) .... By mouth daily 17)  Patanase 0.6 % Soln (Olopatadine hcl) .... Two sprays each nostril twice daily pt failed astelin/astepro  Patient Instructions: 1)  Take avelox one daily for 5 days 2)  Stay on flonase  daily 3)  Start Patanase two sprays each nostril twice daily 4)  No prednisone 5)  Use Delsym OTC as needed for cough, or Mucinex DM twice daily 6)  Stop Tramadol Prescriptions: PATANASE  0.6 % SOLN (OLOPATADINE HCL) Two sprays each nostril twice daily Pt failed astelin/astepro  #1 x 1   Entered and Authorized by:   Storm Frisk MD   Signed by:   Storm Frisk MD on 03/31/2010   Method used:   Electronically to        [facility]* (retail)       [address]       [place], [place]  [address]       Ph: [phone]       Fax: [phone]   RxID:   [id number] AVELOX 400 MG  TABS (MOXIFLOXACIN HCL) By mouth daily  #3 x 0   Entered and Authorized by:   Storm Frisk MD   Signed by:   Storm Frisk MD on 03/31/2010   Method used:   Electronically to        [facility]* (retail)       [address]       [place], [place]  [address]       Ph: [phone]       Fax: [phone]   RxID:   [id number]   Appended Document: Pulmonary OV fax Herb Grays

## 2010-09-23 NOTE — Assessment & Plan Note (Signed)
Summary: Pulmonary OV   Copy to:  Barneston Callas Primary Provider/Referring Provider:  Herb Grays  CC:  2 month followup; pt says feel like have bronchitis again. sx started yesterday, sorethroat, , headache, and cnon productive cough.  History of Present Illness: 60yo WF with cyclic cough and VCD with GERD and upper airway instability.  No true asthma.  Methacholine Challenge 11/09 Neg for reactive airway disease   February 04, 2010  Pt seen May6/11.  Pt had otitis media and pharyngitis and was rx with suprax. Strep screen was neg. Pt improved until one week ago then worse.   Now:  more dyspnea, throat hurts to talk, headache, cough loose rattle, not as much the dry barky cough. "donkey " cough is better.  Hoarseness is better.  Ears are slightly full but not painful.   Cough is productive in the am of thick yellow. Start Suprax one twice daily Veramyst/flonase two sprays each nostril daily Resume tussicaps one twice daily as needed for severe cough A depomedrol 120mg  IM  better, then worse mid July, rx abx and tramadol no prednsione  page 2 March 23, 2010 Acute visit.  Pt c/o cough- prod in the am with yellow sputum.  She also c/o "feels like throat closing"- having diff with swallowing x 3 days.  only times she's been better were both on cough suppression either tussicaps or tramdol.  Pt denies any significant sore throat, itching, sneezing,  nasal congestion or excess secretions,  fever, chills, sweats, unintended wt loss, pleuritic or exertional cp, hempoptysis, change in activity tolerance  orthopnea pnd or leg swelling. Pt also denies any obvious fluctuation in symptoms with weather or environmental change or other alleviating or aggravating factors.    March 31, 2010 3:43 PM The pt has had one week of progressive cough and sore throat.  Pt seen by MW 8/2 and rx wtih cough suppressants, no abx.  Pt then fell and sent to ED.  No abx given. WBC elevated.  Pt dehydrated.  Pt not admitted. CXR was  neg.  Pt injured RLE.  Pt still with sore throat. Strep screen neg.  Still coughing in a paroxysmal nature.  Cough is non productive.  There is sinus drainage.  Dr Sherene Sires did a CT sinus that was neg. Pt states tramadol makes the pt dizzy   April 21, 2010 9:21 AM At last ov we rec: Take avelox one daily for 5 days Stay on flonase daily Start Patanase two sprays each nostril twice daily No prednisone Use Delsym OTC as needed for cough, or Mucinex DM twice daily was ok then backwards again Throat is sore and ear hurts on R side. Never had R ear eval. sinus ct was neg.,  Nose is ok.  Notes sneezing.   delsym helps at night  PAGE 3 >>  May 04, 2010 8:55 AM The pt is now noting less cough and dyspnea.  The throat is less sore.  There is still R ear pain but this is less. No f/c/s.  The cough is less.  The cough is non productive.  The pt has finished the ABX. Pt denies any significant sore throat, nasal congestion or excess secretions, fever, chills, sweats, unintended weight loss, pleurtic or exertional chest pain, orthopnea PND, or leg swelling Pt denies any increase in rescue therapy over baseline, denies waking up needing it or having any early am or nocturnal exacerbations of coughing/wheezing/or dyspnea.  May 27, 2010--Presents for an acute office visit. Complains of  headache, , sore throat, right ear discomfort, increased dyspnea,  prod cough with light yellow mucus x4days . OTC not helping. Cough keeps coming back. Waxes and wanes. Gets a little better but never totally goes away. The last 4 days cough has been really bad, Mainly just dry cough. No fever.  She is using delsym with out much help. Denies chest pain,  orthopnea, hemoptysis, fever, n/v/d, edema, headache.   June 15, 2010 9:38 AM feels better.  then started back with coughing and more throat sore.  granddaughter with sore throat, cough is dry now voice is worse.   R ear hurts,  notes some pndrip  August 19, 2010 9:36 AM Saw PCP on Dec 6th and Rx steroid and avelox.  Rx cough med.  did get better now is worse over past 24hrs. Now coughing more, sore in chest , throat is sore,  no real heartburn.  Notes more pn drip.  Notes some dyspnea.  No real chest pain. No f/c/s.   Preventive Screening-Counseling & Management  Alcohol-Tobacco     Alcohol drinks/day: occassionally     Alcohol type: beer     Smoking Status: never     Passive Smoke Exposure: yes  Current Medications (verified): 1)  Digoxin 0.25 Mg Tabs (Digoxin) .Marland Kitchen.. 1 By Mouth Daily 2)  Metoprolol Tartrate 25 Mg Tabs (Metoprolol Tartrate) .... Once Daily 3)  Lasix 20 Mg Tabs (Furosemide) .Marland Kitchen.. 1 By Mouth Daily and As Needed 4)  Amitriptyline Hcl 50 Mg Tabs (Amitriptyline Hcl) .Marland Kitchen.. 1 By Mouth Daily 5)  Excedrin Migraine 250-250-65 Mg Tabs (Aspirin-Acetaminophen-Caffeine) .... As Needed 6)  Tylenol Arthritis Pain 650 Mg Cr-Tabs (Acetaminophen) .... As Needed 7)  Synthroid 75 Mcg Tabs (Levothyroxine Sodium) .Marland Kitchen.. 1 Once Daily 8)  Ranitidine Hcl 300 Mg Caps (Ranitidine Hcl) .... Take 1 Tablet By Mouth Once A Day At Bedtime 9)  Alprazolam 0.25 Mg Tabs (Alprazolam) .... As Needed 10)  Cyclobenzaprine Hcl 10 Mg Tabs (Cyclobenzaprine Hcl) .... Take 1/2-1 Tab By Mouth At Bedtime As Needed 11)  Delsym 30 Mg/69ml Lqcr (Dextromethorphan Polistirex) .... As Needed 12)  Hydromet 5-1.5 Mg/38ml Syrp (Hydrocodone-Homatropine) .Marland Kitchen.. 1-2 Tsp Every 4-6 Hr As Needed Cough  Allergies: 1)  ! Codeine 2)  ! Erythromycin 3)  ! Sulfa 4)  ! Tramadol Hcl  Past History:  Past medical, surgical, family and social histories (including risk factors) reviewed, and no changes noted (except as noted below).  Past Medical History: Reviewed history from 03/23/2010 and no changes required. FIBROMYALGIA (ICD-729.1) OSTEOARTHRITIS (ICD-715.90) HYPERTENSION (ICD-401.9) VASOMOTOR RHINITIS (ICD-477.9) exacerbates VCD  ASTHMA (ICD-493.90)--->No true asthma    -normal  spirometry 2009 FeV1>90%predicted    -MethaCholine challenge neg 11/09 Vocal cord dysfunction proven on FOB 9/09 Chronic cough      - Sinus ct March 23, 2010 >>>      - Allergy profile March 23, 2010 >>>      - Flutter valve rx March 23, 2010   GERD exacerbating VCD Hyperthyroidism with hot nodule     -total thyroidectomy 11/05/08  benign  Past Surgical History: Reviewed history from 05/01/2009 and no changes required. hysterectomy 1980  Basal cell ca 11/08 Total thyroidectomy 11/05/08  Family History: Reviewed history from 04/18/2008 and no changes required. Family History Asthma--mother and granddaughter Family History Emphysema -- father Heart disease--mother and father  Social History: Reviewed history from 05/01/2009 and no changes required. 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   Review of  Systems       The patient complains of shortness of breath with activity, non-productive cough, acid heartburn, difficulty swallowing, sore throat, and nasal congestion/difficulty breathing through nose.  The patient denies shortness of breath at rest, productive cough, coughing up blood, chest pain, irregular heartbeats, indigestion, loss of appetite, weight change, abdominal pain, tooth/dental problems, headaches, sneezing, itching, ear ache, anxiety, depression, hand/feet swelling, joint stiffness or pain, rash, change in color of mucus, and fever.    Vital Signs:  Patient profile:   60 year old female Height:      64 inches Weight:      118 pounds BMI:     20.33 O2 Sat:      100 % on Room air Temp:     97.5 degrees F oral Pulse rate:   76 / minute BP sitting:   110 / 68  (right arm) Cuff size:   regular  Vitals Entered By: Kandice Hams CMA (August 19, 2010 9:35 AM)  O2 Flow:  Room air  Physical Exam  Additional Exam:  anxious amb wf depressed affect, barking harsh  quality upper airway cough Heent:  no jvd, no TMG, no cervical LNademopathy,  orophyx clear,  R TM clear, mild pharyngeal erythema Cor: RRR nl s1/s2  no s3/s4  no m r h g Abd: soft NT BSA   no masses  No HSM  no rebound or guarding Ext perfused with no c v e v.d Neuro: intact, moves all 4s, CN II-XII intact, DTRs intact Chest: coarse BS with upper airway psuedowheezing    Impression & Recommendations:  Problem # 1:  ACUTE BRONCHITIS (ICD-466.0) Assessment Deteriorated acute bronchitis with flare and GERd plan augmentin chlorpheniramine tussionex with cyclic cough protocol dexilant  Medications Added to Medication List This Visit: 1)  Tussionex Pennkinetic Er 8-10 Mg/50ml Lqcr (Chlorpheniramine-hydrocodone) .... 5 cc by mouth twice daily 2)  Chlorpheniramine Maleate 12 Mg Cr-tabs (Chlorpheniramine maleate) .... One by mouth two times a day 3)  Amoxicillin-pot Clavulanate 875-125 Mg Tabs (Amoxicillin-pot clavulanate) .... One by mouth two times a day 4)  Dexilant 60 Mg Cpdr (Dexlansoprazole) .... Take one by mouth daily pt failed omeprazole/nexium  Complete Medication List: 1)  Digoxin 0.25 Mg Tabs (Digoxin) .Marland Kitchen.. 1 by mouth daily 2)  Metoprolol Tartrate 25 Mg Tabs (Metoprolol tartrate) .... Once daily 3)  Lasix 20 Mg Tabs (Furosemide) .Marland Kitchen.. 1 by mouth daily and as needed 4)  Amitriptyline Hcl 50 Mg Tabs (Amitriptyline hcl) .Marland Kitchen.. 1 by mouth daily 5)  Excedrin Migraine 250-250-65 Mg Tabs (Aspirin-acetaminophen-caffeine) .... As needed 6)  Tylenol Arthritis Pain 650 Mg Cr-tabs (Acetaminophen) .... As needed 7)  Synthroid 75 Mcg Tabs (Levothyroxine sodium) .Marland Kitchen.. 1 once daily 8)  Ranitidine Hcl 300 Mg Caps (Ranitidine hcl) .... Take 1 tablet by mouth once a day at bedtime 9)  Alprazolam 0.25 Mg Tabs (Alprazolam) .... As needed 10)  Cyclobenzaprine Hcl 10 Mg Tabs (Cyclobenzaprine hcl) .... Take 1/2-1 tab by mouth at bedtime as needed 11)  Delsym 30 Mg/44ml Lqcr (Dextromethorphan polistirex) .... As needed 12)  Tussionex Pennkinetic Er 8-10 Mg/65ml Lqcr  (Chlorpheniramine-hydrocodone) .... 5 cc by mouth twice daily 13)  Chlorpheniramine Maleate 12 Mg Cr-tabs (Chlorpheniramine maleate) .... One by mouth two times a day 14)  Amoxicillin-pot Clavulanate 875-125 Mg Tabs (Amoxicillin-pot clavulanate) .... One by mouth two times a day 15)  Dexilant 60 Mg Cpdr (Dexlansoprazole) .... Take one by mouth daily pt failed omeprazole/nexium  Other Orders: Est. Patient Level V (16109)  Depo- Medrol 40mg  (J1030) Depo- Medrol 80mg  (J1040) Admin of Therapeutic Inj  intramuscular or subcutaneous (16109)  Patient Instructions: 1)  Use Tussionex with cyclic cough protocol 2)  Start chlorpheniramine one twice daily 3)  Augmentin for 7days 4)  Resume Dexilant daily 5)  A depomedrol 120mg  IM injection will be given 6)  Return 6 weeks [facility] office  Prescriptions: DEXILANT 60 MG CPDR (DEXLANSOPRAZOLE) Take one by mouth daily Pt failed omeprazole/nexium  #30 x 6   Entered and Authorized by:   Storm Frisk MD   Signed by:   Storm Frisk MD on 08/19/2010   Method used:   Electronically to        [facility]* (retail)       [address]       [place], [place]  [address]       Ph: [phone]       Fax: [phone]   RxID:   [id number] Sandria Senter ER 8-10 MG/5ML  LQCR (CHLORPHENIRAMINE-HYDROCODONE) 5 cc by mouth twice daily  #200 ML x 0   Entered and Authorized by:   Storm Frisk MD   Signed by:   Storm Frisk MD on 08/19/2010   Method used:   Print then Give to Patient   RxID:   [id number] AMOXICILLIN-POT CLAVULANATE 875-125 MG TABS (AMOXICILLIN-POT CLAVULANATE) one by mouth two times a day  #14 x 0   Entered and Authorized by:   Storm Frisk MD   Signed by:   Storm Frisk MD on 08/19/2010   Method used:   Electronically to        [facility]* (retail)       [address]       [place], [place]  [address]       Ph: [phone]       Fax: [phone]   RxID:    [id number] CHLORPHENIRAMINE MALEATE 12 MG CR-TABS (CHLORPHENIRAMINE MALEATE) One by mouth two times a day  #60 x 6   Entered and Authorized by:   Storm Frisk MD   Signed by:   Storm Frisk MD on 08/19/2010   Method used:   Electronically to        [facility]* (retail)       [address]       [place], [place]  [address]       Ph: [phone]       Fax: [phone]   RxID:   [id number]    Medication Administration  Injection # 1:    Medication: Depo- Medrol 40mg     Diagnosis: ACUTE BRONCHITIS (ICD-466.0)    Route: IM    Site: LUOQ gluteus    Exp Date: 06/22/2012    Lot #: [id number]    Mfr: [employer]    Patient tolerated injection without complications    Given by: Kandice Hams CMA (August 19, 2010 10:58 AM)  Injection # 2:    Medication: Depo- Medrol 80mg     Diagnosis: ACUTE BRONCHITIS (ICD-466.0)    Route: IM    Site: LUOQ gluteus    Exp Date: 06/29/2012    Lot #: [id number]    Mfr: [employer]    Patient tolerated injection without complications    Given by: Kandice Hams CMA (August 19, 2010 10:59 AM)  Orders Added: 1)  Est. Patient Level V [29937] 2)  Depo- Medrol 40mg  [J1030] 3)  Depo- Medrol 80mg  [J1040] 4)  Admin of Therapeutic Inj  intramuscular or subcutaneous [62130]

## 2010-09-28 ENCOUNTER — Other Ambulatory Visit: Payer: Self-pay | Admitting: Critical Care Medicine

## 2010-09-28 ENCOUNTER — Encounter: Payer: Self-pay | Admitting: Critical Care Medicine

## 2010-09-28 ENCOUNTER — Ambulatory Visit (INDEPENDENT_AMBULATORY_CARE_PROVIDER_SITE_OTHER)
Admission: RE | Admit: 2010-09-28 | Discharge: 2010-09-28 | Disposition: A | Discharge status: Home / Self Care | Payer: BC Managed Care – PPO | Source: Ambulatory Visit | Attending: Critical Care Medicine | Admitting: Critical Care Medicine

## 2010-09-28 ENCOUNTER — Telehealth (INDEPENDENT_AMBULATORY_CARE_PROVIDER_SITE_OTHER): Payer: Self-pay | Admitting: *Deleted

## 2010-09-28 ENCOUNTER — Ambulatory Visit (INDEPENDENT_AMBULATORY_CARE_PROVIDER_SITE_OTHER): Payer: BC Managed Care – PPO | Admitting: Critical Care Medicine

## 2010-09-28 DIAGNOSIS — K219 Gastro-esophageal reflux disease without esophagitis: Secondary | ICD-10-CM

## 2010-09-28 DIAGNOSIS — J383 Other diseases of vocal cords: Secondary | ICD-10-CM

## 2010-09-28 DIAGNOSIS — J209 Acute bronchitis, unspecified: Secondary | ICD-10-CM

## 2010-09-28 DIAGNOSIS — J018 Other acute sinusitis: Secondary | ICD-10-CM

## 2010-09-29 ENCOUNTER — Ambulatory Visit: Payer: Self-pay | Admitting: Critical Care Medicine

## 2010-10-07 NOTE — Assessment & Plan Note (Addendum)
Summary: Pulmonary OV   Copy to:  [place] [facility] Primary Provider/Referring Provider:  Herb Grays  CC:  6 wk follow up.  c/o hoarseness, ear pain, chest soreness, cough - prod at times with yellow mucus, and increased difficultly breathing x 2-3 days.  Marland Kitchen  History of Present Illness: 59yo WF with cyclic cough and VCD with GERD and upper airway instability.  No true asthma.  Methacholine Challenge 11/09 Neg for reactive airway disease   February 04, 2010  Pt seen May6/11.  Pt had otitis media and pharyngitis and was rx with suprax. Strep screen was neg. Pt improved until one week ago then worse.   Now:  more dyspnea, throat hurts to talk, headache, cough loose rattle, not as much the dry barky cough. "donkey " cough is better.  Hoarseness is better.  Ears are slightly full but not painful.   Cough is productive in the am of thick yellow. Start Suprax one twice daily Veramyst/flonase two sprays each nostril daily Resume tussicaps one twice daily as needed for severe cough A depomedrol 120mg  IM  better, then worse mid July, rx abx and tramadol no prednsione  page 2 March 23, 2010 Acute visit.  Pt c/o cough- prod in the am with yellow sputum.  She also c/o "feels like throat closing"- having diff with swallowing x 3 days.  only times she's been better were both on cough suppression either tussicaps or tramdol.  Pt denies any significant sore throat, itching, sneezing,  nasal congestion or excess secretions,  fever, chills, sweats, unintended wt loss, pleuritic or exertional cp, hempoptysis, change in activity tolerance  orthopnea pnd or leg swelling. Pt also denies any obvious fluctuation in symptoms with weather or environmental change or other alleviating or aggravating factors.    March 31, 2010 3:43 PM The pt has had one week of progressive cough and sore throat.  Pt seen by MW 8/2 and rx wtih cough suppressants, no abx.  Pt then fell and sent to ED.  No abx given. WBC elevated.  Pt dehydrated.  Pt  not admitted. CXR was neg.  Pt injured RLE.  Pt still with sore throat. Strep screen neg.  Still coughing in a paroxysmal nature.  Cough is non productive.  There is sinus drainage.  Dr Sherene Sires did a CT sinus that was neg. Pt states tramadol makes the pt dizzy   April 21, 2010 9:21 AM At last ov we rec: Take avelox one daily for 5 days Stay on flonase daily Start Patanase two sprays each nostril twice daily No prednisone Use Delsym OTC as needed for cough, or Mucinex DM twice daily was ok then backwards again Throat is sore and ear hurts on R side. Never had R ear eval. sinus ct was neg.,  Nose is ok.  Notes sneezing.   delsym helps at night  PAGE 3 >>  May 04, 2010 8:55 AM The pt is now noting less cough and dyspnea.  The throat is less sore.  There is still R ear pain but this is less. No f/c/s.  The cough is less.  The cough is non productive.  The pt has finished the ABX. Pt denies any significant sore throat, nasal congestion or excess secretions, fever, chills, sweats, unintended weight loss, pleurtic or exertional chest pain, orthopnea PND, or leg swelling Pt denies any increase in rescue therapy over baseline, denies waking up needing it or having any early am or nocturnal exacerbations of coughing/wheezing/or dyspnea.  May 27, 2010--Presents for an acute office visit. Complains of headache, , sore throat, right ear discomfort, increased dyspnea,  prod cough with light yellow mucus x4days . OTC not helping. Cough keeps coming back. Waxes and wanes. Gets a little better but never totally goes away. The last 4 days cough has been really bad, Mainly just dry cough. No fever.  She is using delsym with out much help. Denies chest pain,  orthopnea, hemoptysis, fever, n/v/d, edema, headache.   June 15, 2010 9:38 AM feels better.  then started back with coughing and more throat sore.  granddaughter with sore throat, cough is dry now voice is worse.   R ear hurts,  notes some  pndrip  August 19, 2010 9:36 AM Saw PCP on Dec 6th and Rx steroid and avelox.  Rx cough med.  did get better now is worse over past 24hrs. Now coughing more, sore in chest , throat is sore,  no real heartburn.  Notes more pn drip.  Notes some dyspnea.  No real chest pain. No f/c/s. September 28, 2010 4:15 PM Did better after 5 days of Rx in 12/29 then worse in mid 1/12.  Rx levaquin per PA.  inhaler aggravated.  proventil now off ABX,  throat is sore ,  still cyclical cough  on dexilant and zantac.  did ok until had tomato soup sunday and this caused heartburn L ear is an issue. Throat is sore and chest hurts with hard cough no fever cough is productive of green no cxr was done    Current Medications (verified): 1)  Digoxin 0.25 Mg Tabs (Digoxin) .Marland Kitchen.. 1 By Mouth Daily 2)  Metoprolol Tartrate 25 Mg Tabs (Metoprolol Tartrate) .... Once Daily 3)  Lasix 20 Mg Tabs (Furosemide) .Marland Kitchen.. 1 By Mouth Daily and As Needed 4)  Amitriptyline Hcl 50 Mg Tabs (Amitriptyline Hcl) .Marland Kitchen.. 1 By Mouth Daily 5)  Excedrin Migraine 250-250-65 Mg Tabs (Aspirin-Acetaminophen-Caffeine) .... As Needed 6)  Tylenol Arthritis Pain 650 Mg Cr-Tabs (Acetaminophen) .... As Needed 7)  Synthroid 75 Mcg Tabs (Levothyroxine Sodium) .Marland Kitchen.. 1 Once Daily 8)  Ranitidine Hcl 300 Mg Caps (Ranitidine Hcl) .... Take 1 Tablet By Mouth Once A Day At Bedtime 9)  Alprazolam 0.25 Mg Tabs (Alprazolam) .... As Needed 10)  Cyclobenzaprine Hcl 10 Mg Tabs (Cyclobenzaprine Hcl) .... Take 1/2-1 Tab By Mouth At Bedtime As Needed 11)  Delsym 30 Mg/47ml Lqcr (Dextromethorphan Polistirex) .... As Needed 12)  Tussionex Pennkinetic Er 8-10 Mg/39ml  Lqcr (Chlorpheniramine-Hydrocodone) .... 5 Cc By Mouth Twice Daily 13)  Dexilant 60 Mg Cpdr (Dexlansoprazole) .... Take One By Mouth Daily Pt Failed Omeprazole/nexium 14)  Lumigan 0.01 % Soln (Bimatoprost) .Marland Kitchen.. 1 Drop Each Eyes At Bedtime  Allergies (verified): 1)  ! Codeine 2)  ! Erythromycin 3)  !  Sulfa 4)  ! Tramadol Hcl  Past History:  Past medical, surgical, family and social histories (including risk factors) reviewed, and no changes noted (except as noted below).  Past Medical History: Reviewed history from 03/23/2010 and no changes required. FIBROMYALGIA (ICD-729.1) OSTEOARTHRITIS (ICD-715.90) HYPERTENSION (ICD-401.9) VASOMOTOR RHINITIS (ICD-477.9) exacerbates VCD  ASTHMA (ICD-493.90)--->No true asthma    -normal spirometry 2009 FeV1>90%predicted    -MethaCholine challenge neg 11/09 Vocal cord dysfunction proven on FOB 9/09 Chronic cough      - Sinus ct March 23, 2010 >>>      - Allergy profile March 23, 2010 >>>      - Flutter valve rx March 23, 2010  GERD exacerbating VCD Hyperthyroidism with hot nodule     -total thyroidectomy 11/05/08  benign  Past Surgical History: Reviewed history from 05/01/2009 and no changes required. hysterectomy 1980  Basal cell ca 11/08 Total thyroidectomy 11/05/08  Family History: Reviewed history from 04/18/2008 and no changes required. Family History Asthma--mother and granddaughter Family History Emphysema -- father Heart disease--mother and father  Social History: Reviewed history from 05/01/2009 and no changes required. 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   Review of Systems       The patient complains of non-productive cough, difficulty swallowing, and sore throat.  The patient denies shortness of breath with activity, shortness of breath at rest, productive cough, coughing up blood, chest pain, irregular heartbeats, acid heartburn, indigestion, loss of appetite, weight change, abdominal pain, tooth/dental problems, headaches, nasal congestion/difficulty breathing through nose, sneezing, itching, ear ache, anxiety, depression, hand/feet swelling, joint stiffness or pain, rash, change in color of mucus, and fever.    Vital Signs:  Patient profile:   60 year old female Height:      64  inches Weight:      116 pounds BMI:     19.98 O2 Sat:      100 % on Room air Temp:     98.5 degrees F oral Pulse rate:   100 / minute BP sitting:   122 / 72  (right arm) Cuff size:   regular  Vitals Entered By: Gweneth Dimitri RN (September 28, 2010 4:03 PM)  O2 Flow:  Room air CC: 6 wk follow up.  c/o hoarseness, ear pain, chest soreness, cough - prod at times with yellow mucus, increased difficultly breathing x 2-3 days.   Comments Medications reviewed with patient Daytime contact number verified with patient. Gweneth Dimitri RN  September 28, 2010 4:03 PM    Physical Exam  Additional Exam:  anxious amb wf depressed affect, barking harsh  quality upper airway cough Heent:  no jvd, no TMG, no cervical LNademopathy, orophyx clear,  R TM clear, mild pharyngeal erythema, nasal purulence Cor: RRR nl s1/s2  no s3/s4  no m r h g Abd: soft NT BSA   no masses  No HSM  no rebound or guarding Ext perfused with no c v e v.d Neuro: intact, moves all 4s, CN II-XII intact, DTRs intact Chest: coarse BS with upper airway psuedowheezing    CXR  Procedure date:  09/28/2010  Findings:      IMPRESSION: Mild emphysematous and chronic bronchitic changes. No acute abnormalities.  Impression & Recommendations:  Problem # 1:  OTHER ACUTE SINUSITIS (ICD-461.8) Assessment Deteriorated acute sinusitis with flare of cyclical cough  plan  Augmentin one twice daily for 10days Prednisone 10mg  Take 4 daily for two days, then 3 daily for two days, then two daily for two days then one daily for two days then stop Cyclic cough protocol with tussionex/tessalon perles Stop all inhalers Stay on reflux medications A Chest xray will be obtained>>>no active disease seen Return 1 month  The following medications were removed from the medication list:    Delsym 30 Mg/39ml Lqcr (Dextromethorphan polistirex) .Marland Kitchen... As needed Her updated medication list for this problem includes:    Tussionex Pennkinetic Er 8-10  Mg/86ml Lqcr (Chlorpheniramine-hydrocodone) .Marland KitchenMarland KitchenMarland KitchenMarland Kitchen 5 cc by mouth twice daily    Amoxicillin-pot Clavulanate 875-125 Mg Tabs (Amoxicillin-pot clavulanate) ..... One by mouth two times a day    Benzonatate 100 Mg Caps (Benzonatate) .Marland Kitchen..Marland Kitchen Two by mouth every 4  hours as needed for cough  Orders: Est. Patient Level IV (09811)  Medications Added to Medication List This Visit: 1)  Lumigan 0.01 % Soln (Bimatoprost) .Marland Kitchen.. 1 drop each eyes at bedtime 2)  Amoxicillin-pot Clavulanate 875-125 Mg Tabs (Amoxicillin-pot clavulanate) .... One by mouth two times a day 3)  Prednisone 10 Mg Tabs (Prednisone) .... Take as directed take 4 daily for two days, then 3 daily for two days, then two daily for two days then one daily for two days then stop 4)  Benzonatate 100 Mg Caps (Benzonatate) .... Two by mouth every 4 hours as needed for cough  Complete Medication List: 1)  Digoxin 0.25 Mg Tabs (Digoxin) .Marland Kitchen.. 1 by mouth daily 2)  Metoprolol Tartrate 25 Mg Tabs (Metoprolol tartrate) .... Once daily 3)  Lasix 20 Mg Tabs (Furosemide) .Marland Kitchen.. 1 by mouth daily and as needed 4)  Amitriptyline Hcl 50 Mg Tabs (Amitriptyline hcl) .Marland Kitchen.. 1 by mouth daily 5)  Excedrin Migraine 250-250-65 Mg Tabs (Aspirin-acetaminophen-caffeine) .... As needed 6)  Tylenol Arthritis Pain 650 Mg Cr-tabs (Acetaminophen) .... As needed 7)  Synthroid 75 Mcg Tabs (Levothyroxine sodium) .Marland Kitchen.. 1 once daily 8)  Ranitidine Hcl 300 Mg Caps (Ranitidine hcl) .... Take 1 tablet by mouth once a day at bedtime 9)  Alprazolam 0.25 Mg Tabs (Alprazolam) .... As needed 10)  Cyclobenzaprine Hcl 10 Mg Tabs (Cyclobenzaprine hcl) .... Take 1/2-1 tab by mouth at bedtime as needed 11)  Tussionex Pennkinetic Er 8-10 Mg/67ml Lqcr (Chlorpheniramine-hydrocodone) .... 5 cc by mouth twice daily 12)  Dexilant 60 Mg Cpdr (Dexlansoprazole) .... Take one by mouth daily pt failed omeprazole/nexium 13)  Lumigan 0.01 % Soln (Bimatoprost) .Marland Kitchen.. 1 drop each eyes at bedtime 14)  Amoxicillin-pot  Clavulanate 875-125 Mg Tabs (Amoxicillin-pot clavulanate) .... One by mouth two times a day 15)  Prednisone 10 Mg Tabs (Prednisone) .... Take as directed take 4 daily for two days, then 3 daily for two days, then two daily for two days then one daily for two days then stop 16)  Benzonatate 100 Mg Caps (Benzonatate) .... Two by mouth every 4 hours as needed for cough  Other Orders: T-2 View CXR ([id number])  Patient Instructions: 1)  Augmentin one twice daily for 10days 2)  Prednisone 10mg  Take 4 daily for two days, then 3 daily for two days, then two daily for two days then one daily for two days then stop 3)  Cyclic cough protocol with tussionex/tessalon perles 4)  Stop all inhalers 5)  Stay on reflux medications 6)  A Chest xray will be obtained 7)  Return 1 month  Prescriptions: BENZONATATE 100 MG CAPS (BENZONATATE) two by mouth every 4 hours as needed for cough  #90 x 4   Entered and Authorized by:   Storm Frisk MD   Signed by:   Storm Frisk MD on 09/28/2010   Method used:   Electronically to        [facility] # [phone]* (retail)       [address]       [place], [place]  [address]       Ph: [phone]       Fax: [phone]   RxID:   [id number] Sandria Senter ER 8-10 MG/5ML  LQCR (CHLORPHENIRAMINE-HYDROCODONE) 5 cc by mouth twice daily  #200 mL x 0   Entered and Authorized by:   Storm Frisk MD   Signed by:   Storm Frisk MD on 09/28/2010   Method used:  Print then Give to Patient   RxID:   [id number] PREDNISONE 10 MG  TABS (PREDNISONE) Take as directed Take 4 daily for two days, then 3 daily for two days, then two daily for two days then one daily for two days then stop  #20 x 0   Entered and Authorized by:   Storm Frisk MD   Signed by:   Storm Frisk MD on 09/28/2010   Method used:   Electronically to        [facility] # [phone]* (retail)       [address]       [place], [place]  [address]       Ph: [phone]        Fax: [phone]   RxID:   [id number] AMOXICILLIN-POT CLAVULANATE 875-125 MG TABS (AMOXICILLIN-POT CLAVULANATE) one by mouth two times a day  #20 x 0   Entered and Authorized by:   Storm Frisk MD   Signed by:   Storm Frisk MD on 09/28/2010   Method used:   Electronically to        [facility] # [phone]* (retail)       [address]       [place], [place]  [address]       Ph: [phone]       Fax: [phone]   RxID:   [id number]   Appended Document: Pulmonary OV fax tammy spear

## 2010-10-07 NOTE — Progress Notes (Signed)
Summary: wants to see if she can be worked intoday has appt tomorrow  Phone Note Call from Patient   Caller: Patient Call For: WRIGHT Summary of Call: Patient phoned she has an appointment for tomorrow but she is sick, hoarse, chest pain, headache, ear pain and is coughing up a thick yellow mucus. She wants to know if there is any way that she can be worked in today. Patient can be reached [phone] she uses [facility] at [address] and [address].  Initial call taken by: Vedia Coffer,  September 28, 2010 9:15 AM  Follow-up for Phone Call        appt today with PW at 4:15--susan called pt and she accepted Follow-up by: Philipp Deputy CMA,  September 28, 2010 11:00 AM

## 2010-11-03 ENCOUNTER — Encounter: Payer: Self-pay | Admitting: Cardiology

## 2010-11-03 ENCOUNTER — Ambulatory Visit (INDEPENDENT_AMBULATORY_CARE_PROVIDER_SITE_OTHER): Payer: BC Managed Care – PPO | Admitting: Nurse Practitioner

## 2010-11-03 ENCOUNTER — Ambulatory Visit: Payer: Self-pay | Admitting: Cardiology

## 2010-11-03 DIAGNOSIS — I471 Supraventricular tachycardia, unspecified: Secondary | ICD-10-CM

## 2010-11-03 DIAGNOSIS — R002 Palpitations: Secondary | ICD-10-CM

## 2010-11-04 ENCOUNTER — Encounter: Payer: Self-pay | Admitting: Critical Care Medicine

## 2010-11-04 ENCOUNTER — Ambulatory Visit (INDEPENDENT_AMBULATORY_CARE_PROVIDER_SITE_OTHER): Payer: BC Managed Care – PPO | Admitting: Critical Care Medicine

## 2010-11-04 DIAGNOSIS — J209 Acute bronchitis, unspecified: Secondary | ICD-10-CM

## 2010-11-04 DIAGNOSIS — K219 Gastro-esophageal reflux disease without esophagitis: Secondary | ICD-10-CM

## 2010-11-04 DIAGNOSIS — J309 Allergic rhinitis, unspecified: Secondary | ICD-10-CM

## 2010-11-04 DIAGNOSIS — R05 Cough: Secondary | ICD-10-CM

## 2010-11-05 LAB — CBC
MCH: 28.4 pg (ref 26.0–34.0)
MCV: 82.4 fL (ref 78.0–100.0)
Platelets: 552 10*3/uL — ABNORMAL HIGH (ref 150–400)
RDW: 13.1 % (ref 11.5–15.5)

## 2010-11-05 LAB — POCT I-STAT, CHEM 8
Calcium, Ion: 1.15 mmol/L (ref 1.12–1.32)
HCT: 54 % — ABNORMAL HIGH (ref 36.0–46.0)
Hemoglobin: 18.4 g/dL — ABNORMAL HIGH (ref 12.0–15.0)
Sodium: 133 mEq/L — ABNORMAL LOW (ref 135–145)
TCO2: 30 mmol/L (ref 0–100)

## 2010-11-05 LAB — DIFFERENTIAL
Basophils Absolute: 0 10*3/uL (ref 0.0–0.1)
Basophils Relative: 0 % (ref 0–1)
Eosinophils Absolute: 0 10*3/uL (ref 0.0–0.7)
Eosinophils Relative: 0 % (ref 0–5)
Neutrophils Relative %: 83 % — ABNORMAL HIGH (ref 43–77)

## 2010-11-05 LAB — DIGOXIN LEVEL: Digoxin Level: 1.5 ng/mL (ref 0.8–2.0)

## 2010-11-18 NOTE — Assessment & Plan Note (Signed)
Summary: Pulmonary OV   Copy to:   [facility] Primary Provider/Referring Provider:  Herb Grays  CC:  1 month follow up.  Cough started again on Monday with slight increased SOB.  Prod this am with yellow tint.  Denies wheezing and chest tightness.Marland Kitchen  History of Present Illness: 60yo WF with cyclic cough and VCD with GERD and upper airway instability.  No true asthma.  Methacholine Challenge 11/09 Neg for reactive airway disease   November 04, 2010 9:19 AM since last ov,  was better and now is getting worse,  had some tessalon left,  this am was worse and sore throat and L ear hurts Pt with palpitations and now has holter monitor. Cough is essentially dry.  There is no f/c/s.  Pt denies any , fever, chills, sweats, unintended weight loss, pleurtic or exertional chest pain, orthopnea PND, or leg swelling Pt denies any increase in rescue therapy over baseline, denies waking up needing it or having any early am or nocturnal exacerbations of coughing/wheezing/or dyspnea.     Preventive Screening-Counseling & Management  Alcohol-Tobacco     Alcohol drinks/day: occassionally     Alcohol type: beer     Smoking Status: never     Passive Smoke Exposure: yes  Current Medications (verified): 1)  Digoxin 0.25 Mg Tabs (Digoxin) .Marland Kitchen.. 1 By Mouth Daily 2)  Metoprolol Tartrate 25 Mg Tabs (Metoprolol Tartrate) .... Once Daily 3)  Lasix 20 Mg Tabs (Furosemide) .Marland Kitchen.. 1 By Mouth Daily and As Needed 4)  Amitriptyline Hcl 50 Mg Tabs (Amitriptyline Hcl) .Marland Kitchen.. 1 By Mouth Daily 5)  Excedrin Migraine 250-250-65 Mg Tabs (Aspirin-Acetaminophen-Caffeine) .... As Needed 6)  Tylenol Arthritis Pain 650 Mg Cr-Tabs (Acetaminophen) .... As Needed 7)  Synthroid 75 Mcg Tabs (Levothyroxine Sodium) .Marland Kitchen.. 1 Once Daily 8)  Ranitidine Hcl 300 Mg Caps (Ranitidine Hcl) .... Take 1 Tablet By Mouth Once A Day At Bedtime 9)  Alprazolam 0.25 Mg Tabs (Alprazolam) .... As Needed 10)  Cyclobenzaprine Hcl 10 Mg Tabs (Cyclobenzaprine Hcl)  .... Take 1/2-1 Tab By Mouth At Bedtime As Needed 11)  Tussionex Pennkinetic Er 8-10 Mg/48ml  Lqcr (Chlorpheniramine-Hydrocodone) .... 5 Cc By Mouth Twice Daily As Needed 12)  Dexilant 60 Mg Cpdr (Dexlansoprazole) .... Take One By Mouth Daily Pt Failed Omeprazole/nexium 13)  Lumigan 0.01 % Soln (Bimatoprost) .Marland Kitchen.. 1 Drop Each Eyes At Bedtime 14)  Benzonatate 100 Mg Caps (Benzonatate) .... Two By Mouth Every 4 Hours As Needed For Cough  Allergies (verified): 1)  ! Codeine 2)  ! Erythromycin 3)  ! Sulfa 4)  ! Tramadol Hcl  Past History:  Past medical, surgical, family and social histories (including risk factors) reviewed, and no changes noted (except as noted below).  Past Medical History: FIBROMYALGIA (ICD-729.1) OSTEOARTHRITIS (ICD-715.90) HYPERTENSION (ICD-401.9) VASOMOTOR RHINITIS (ICD-477.9) exacerbates VCD  ASTHMA (ICD-493.90)--->No true asthma    -normal spirometry 2009 FeV1>90%predicted    -MethaCholine challenge neg 11/09 Vocal cord dysfunction proven on FOB 9/09 Chronic cough      - Sinus ct March 23, 2010 >>>neg      - Allergy profile March 23, 2010 >>>nl,  IgE 14      - Flutter valve rx March 23, 2010   GERD exacerbating VCD Hyperthyroidism with hot nodule     -total thyroidectomy 11/05/08  benign  Past Surgical History: Reviewed history from 05/01/2009 and no changes required. hysterectomy 1980  Basal cell ca 11/08 Total thyroidectomy 11/05/08  Family History: Reviewed history from 04/18/2008 and no changes  required. Family History Asthma--mother and granddaughter Family History Emphysema -- father Heart disease--mother and father  Social History: Reviewed history from 05/01/2009 and no changes required. 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   Review of Systems       The patient complains of shortness of breath with activity, difficulty swallowing, sore throat, nasal congestion/difficulty breathing through nose, and ear ache.   The patient denies shortness of breath at rest, productive cough, non-productive cough, coughing up blood, chest pain, irregular heartbeats, acid heartburn, indigestion, loss of appetite, weight change, abdominal pain, tooth/dental problems, headaches, sneezing, itching, anxiety, depression, hand/feet swelling, joint stiffness or pain, rash, change in color of mucus, and fever.    Vital Signs:  Patient profile:   60 year old female Height:      64 inches Weight:      121 pounds BMI:     20.84 O2 Sat:      100 % on Room air Temp:     98.2 degrees F oral Pulse rate:   82 / minute BP sitting:   120 / 70  (left arm) Cuff size:   regular  Vitals Entered By: Gweneth Dimitri RN (November 04, 2010 9:08 AM)  O2 Flow:  Room air CC: 1 month follow up.  Cough started again on Monday with slight increased SOB.  Prod this am with yellow tint.  Denies wheezing and chest tightness. Comments Medications reviewed with patient Daytime contact number verified with patient. Gweneth Dimitri RN  November 04, 2010 9:08 AM    Physical Exam  Additional Exam:  anxious amb wf depressed affect, barking harsh  quality upper airway cough Heent:  no jvd, no TMG, no cervical LNademopathy, orophyx clear,  R TM clear, mild pharyngeal erythema, no  nasal purulence Cor: RRR nl s1/s2  no s3/s4  no m r h g Abd: soft NT BSA   no masses  No HSM  no rebound or guarding Ext perfused with no c v e v.d Neuro: intact, moves all 4s, CN II-XII intact, DTRs intact Chest:prom pseudowheeze,  no lower airway wheezing   Impression & Recommendations:  Problem # 1:  COUGH (ICD-786.2) Assessment Deteriorated cyclical cough, GERD and allergic rhinitis as current ppt factors with severe VCD,  no active infection currently discerned plan depo 120mg  IM cyclic cough protocol with delsym/tessalon cont gerd RX Orders: Est. Patient Level IV (98119)  Medications Added to Medication List This Visit: 1)  Tussionex Pennkinetic Er 8-10 Mg/38ml  Lqcr (Chlorpheniramine-hydrocodone) .... 5 cc by mouth twice daily as needed  Complete Medication List: 1)  Digoxin 0.25 Mg Tabs (Digoxin) .Marland Kitchen.. 1 by mouth daily 2)  Metoprolol Tartrate 25 Mg Tabs (Metoprolol tartrate) .... Once daily 3)  Lasix 20 Mg Tabs (Furosemide) .Marland Kitchen.. 1 by mouth daily and as needed 4)  Amitriptyline Hcl 50 Mg Tabs (Amitriptyline hcl) .Marland Kitchen.. 1 by mouth daily 5)  Excedrin Migraine 250-250-65 Mg Tabs (Aspirin-acetaminophen-caffeine) .... As needed 6)  Tylenol Arthritis Pain 650 Mg Cr-tabs (Acetaminophen) .... As needed 7)  Synthroid 75 Mcg Tabs (Levothyroxine sodium) .Marland Kitchen.. 1 once daily 8)  Ranitidine Hcl 300 Mg Caps (Ranitidine hcl) .... Take 1 tablet by mouth once a day at bedtime 9)  Alprazolam 0.25 Mg Tabs (Alprazolam) .... As needed 10)  Cyclobenzaprine Hcl 10 Mg Tabs (Cyclobenzaprine hcl) .... Take 1/2-1 tab by mouth at bedtime as needed 11)  Tussionex Pennkinetic Er 8-10 Mg/60ml Lqcr (Chlorpheniramine-hydrocodone) .... 5 cc by mouth twice daily as needed 12)  Dexilant 60 Mg Cpdr (Dexlansoprazole) .... Take one by mouth daily pt failed omeprazole/nexium 13)  Lumigan 0.01 % Soln (Bimatoprost) .Marland Kitchen.. 1 drop each eyes at bedtime 14)  Benzonatate 100 Mg Caps (Benzonatate) .... Two by mouth every 4 hours as needed for cough  Other Orders: Depo- Medrol 80mg  (J1040) Depo- Medrol 40mg  (J1030) Admin of Therapeutic Inj  intramuscular or subcutaneous (16109)  Patient Instructions: 1)  A depomedrol injection 120mg  IM will be given 2)  Resume cyclic cough protocol use Delsym/tessalon 3)  No anti biotics needed 4)  Return 1 month [place]    Medication Administration  Injection # 1:    Medication: Depo- Medrol 80mg     Diagnosis: ACUTE BRONCHITIS (ICD-466.0)    Route: IM    Site: LUOQ gluteus    Exp Date: 02/2013    Lot #: [id number]    Mfr: [employer]    Patient tolerated injection without complications    Given by: Gweneth Dimitri RN (November 04, 2010 10:10 AM)  Injection #  2:    Medication: Depo- Medrol 40mg     Diagnosis: ACUTE BRONCHITIS (ICD-466.0)    Route: IM    Site: LUOQ gluteus    Exp Date: 02/2013    Lot #: [id number]    Mfr: [employer]    Patient tolerated injection without complications    Given by: Gweneth Dimitri RN (November 04, 2010 10:10 AM)  Orders Added: 1)  Est. Patient Level IV [91478] 2)  Depo- Medrol 80mg  [J1040] 3)  Depo- Medrol 40mg  [J1030] 4)  Admin of Therapeutic Inj  intramuscular or subcutaneous [96372]   Appended Document: Pulmonary OV tammy spears

## 2010-11-27 LAB — MALARIA SMEAR

## 2010-11-27 LAB — BASIC METABOLIC PANEL
BUN: 6 mg/dL (ref 6–23)
BUN: 9 mg/dL (ref 6–23)
CO2: 28 mEq/L (ref 19–32)
Chloride: 99 mEq/L (ref 96–112)
Creatinine, Ser: 0.82 mg/dL (ref 0.4–1.2)
Creatinine, Ser: 1.11 mg/dL (ref 0.4–1.2)
GFR calc non Af Amer: >60 mL/min (ref >60)

## 2010-11-27 LAB — URINALYSIS, ROUTINE W REFLEX MICROSCOPIC
Glucose, UA: NEGATIVE mg/dL
Ketones, ur: 15 mg/dL — AB
Nitrite: NEGATIVE
Protein, ur: 30 mg/dL — AB
Urobilinogen, UA: 1 mg/dL (ref 0.0–1.0)

## 2010-11-27 LAB — CBC
HCT: 34.8 % — ABNORMAL LOW (ref 36.0–46.0)
Hemoglobin: 11.9 g/dL — ABNORMAL LOW (ref 12.0–15.0)
MCHC: 34.2 g/dL (ref 30.0–36.0)
MCHC: 34.3 g/dL (ref 30.0–36.0)
MCV: 86.7 fL (ref 78.0–100.0)
MCV: 88.3 fL (ref 78.0–100.0)
Platelets: 247 10*3/uL (ref 150–400)
Platelets: 314 10*3/uL (ref 150–400)
RBC: 3.95 MIL/uL (ref 3.87–5.11)
WBC: 11.1 10*3/uL — ABNORMAL HIGH (ref 4.0–10.5)
WBC: 21.1 10*3/uL — ABNORMAL HIGH (ref 4.0–10.5)
WBC: 6.5 10*3/uL (ref 4.0–10.5)

## 2010-11-27 LAB — CSF CELL COUNT WITH DIFFERENTIAL
RBC Count, CSF: 710 /mm3 — ABNORMAL HIGH
Tube #: 3
WBC, CSF: 2 /mm3 (ref 0–5)

## 2010-11-27 LAB — PROTEIN AND GLUCOSE, CSF
Glucose, CSF: 67 mg/dL (ref 43–76)
Total  Protein, CSF: 55 mg/dL — ABNORMAL HIGH (ref 15–45)

## 2010-11-27 LAB — ALBUMIN, FLUID (OTHER): Albumin, Fluid: <1 g/dL

## 2010-11-27 LAB — ANTI-SMITH ANTIBODY: ENA SM Ab Ser-aCnc: <0.2 AI (ref <1.0)

## 2010-11-27 LAB — GRAM STAIN

## 2010-11-27 LAB — CULTURE, BLOOD (ROUTINE X 2): Culture: NO GROWTH

## 2010-11-27 LAB — URINE CULTURE: Colony Count: NO GROWTH

## 2010-11-27 LAB — HEPATIC FUNCTION PANEL
Albumin: 2.8 g/dL — ABNORMAL LOW (ref 3.5–5.2)
Alkaline Phosphatase: 55 U/L (ref 39–117)
Total Protein: 5 g/dL — ABNORMAL LOW (ref 6.0–8.3)

## 2010-11-27 LAB — MISCELLANEOUS TEST

## 2010-11-27 LAB — DIFFERENTIAL
Basophils Relative: 0 % (ref 0–1)
Eosinophils Absolute: 0 10*3/uL (ref 0.0–0.7)
Neutrophils Relative %: 92 % — ABNORMAL HIGH (ref 43–77)

## 2010-11-27 LAB — CSF CULTURE W GRAM STAIN: Culture: NO GROWTH

## 2010-11-27 LAB — URINE MICROSCOPIC-ADD ON

## 2010-11-27 LAB — ANA: Anti Nuclear Antibody(ANA): NEGATIVE

## 2010-11-27 LAB — LEGIONELLA ANTIGEN, URINE

## 2010-11-27 LAB — ANTI-DNA ANTIBODY, DOUBLE-STRANDED: ds DNA Ab: <1 IU/mL (ref <5)

## 2010-11-27 LAB — ROCKY MTN SPOTTED FVR AB, IGM-BLOOD: RMSF IgM: 0.11 IV (ref 0.00–0.89)

## 2010-11-27 LAB — SEDIMENTATION RATE: Sed Rate: 7 mm/hr (ref 0–22)

## 2010-12-01 ENCOUNTER — Encounter: Payer: Self-pay | Admitting: Cardiology

## 2010-12-01 ENCOUNTER — Encounter: Payer: Self-pay | Admitting: Critical Care Medicine

## 2010-12-01 DIAGNOSIS — N289 Disorder of kidney and ureter, unspecified: Secondary | ICD-10-CM

## 2010-12-01 DIAGNOSIS — M791 Myalgia, unspecified site: Secondary | ICD-10-CM | POA: Insufficient documentation

## 2010-12-01 DIAGNOSIS — E039 Hypothyroidism, unspecified: Secondary | ICD-10-CM

## 2010-12-01 DIAGNOSIS — I959 Hypotension, unspecified: Secondary | ICD-10-CM | POA: Insufficient documentation

## 2010-12-01 DIAGNOSIS — I480 Paroxysmal atrial fibrillation: Secondary | ICD-10-CM | POA: Insufficient documentation

## 2010-12-01 DIAGNOSIS — H409 Unspecified glaucoma: Secondary | ICD-10-CM | POA: Insufficient documentation

## 2010-12-02 ENCOUNTER — Ambulatory Visit (INDEPENDENT_AMBULATORY_CARE_PROVIDER_SITE_OTHER): Payer: BC Managed Care – PPO | Admitting: Critical Care Medicine

## 2010-12-02 ENCOUNTER — Encounter: Payer: Self-pay | Admitting: Critical Care Medicine

## 2010-12-02 VITALS — BP 116/74 | HR 81 | Temp 98.2°F | Ht 64.0 in | Wt 116.0 lb

## 2010-12-02 DIAGNOSIS — J309 Allergic rhinitis, unspecified: Secondary | ICD-10-CM

## 2010-12-02 DIAGNOSIS — R05 Cough: Secondary | ICD-10-CM

## 2010-12-02 LAB — CALCIUM
Calcium: 8.8 mg/dL (ref 8.4–10.5)
Calcium: 9.2 mg/dL (ref 8.4–10.5)

## 2010-12-02 LAB — CBC
HCT: 41.8 % (ref 36.0–46.0)
Hemoglobin: 14.6 g/dL (ref 12.0–15.0)
Platelets: 418 10*3/uL — ABNORMAL HIGH (ref 150–400)
RDW: 13.5 % (ref 11.5–15.5)
WBC: 9.4 10*3/uL (ref 4.0–10.5)

## 2010-12-02 LAB — BASIC METABOLIC PANEL
Calcium: 9.9 mg/dL (ref 8.4–10.5)
GFR calc non Af Amer: >60 mL/min (ref >60)
Potassium: 4.4 mEq/L (ref 3.5–5.1)
Sodium: 137 mEq/L (ref 135–145)

## 2010-12-02 MED ORDER — LEVOCETIRIZINE DIHYDROCHLORIDE 5 MG PO TABS: 5.0000 mg | ORAL_TABLET | Freq: Every evening | ORAL | Status: DC

## 2010-12-02 NOTE — Progress Notes (Signed)
Subjective:    Patient ID: Kaitlyn Adams, female    DOB: 1950/12/29, 60 y.o.   MRN: [id number]  HPI 59yo WF with cyclic cough and VCD with GERD and upper airway instability. No true asthma. Methacholine Challenge 11/09 Neg for reactive airway disease  November 04, 2010 9:19 AM  since last ov, was better and now is getting worse, had some tessalon left, this am was worse and sore throat and L ear hurts  Pt with palpitations and now has holter monitor.  Cough is essentially dry. There is no f/c/s.  Pt denies any , fever, chills, sweats, unintended weight loss, pleurtic or exertional chest pain, orthopnea PND, or leg swelling  Pt denies any increase in rescue therapy over baseline, denies waking up needing it or having any early am or nocturnal exacerbations of coughing/wheezing/or dyspnea.   12/02/2010 Overall cough is better,  Staying at home and being careful.   Now sl scratchy throat.  A few weeks ago ?otitis : no   Was TMJ. Saw Spear.   No real pndrip.  Past Medical History  Diagnosis Date  . Bronchitis     recurrent  . Paroxysmal atrial fibrillation   . Renal insufficiency   . Myalgia     chronic and arthralgias  . Hypothyroidism   . Glaucoma   . Hypotension     orthostatic  . Fibromyalgia   . Osteoarthritis   . Hypertension   . Vasomotor rhinitis     exacerbates VCD  . Asthma     - normal spirometry 2009 FEV1  >90% predicted.  - methacoline challenge neg 11/09  . Vocal cord dysfunction     proven on FOB 9/09  . Chronic cough     - sinus CT 03-23-10 > neg.  - allergy profile 03-23-10 > nl, IgE 14.  - flutter valve rx 03-23-10  . GERD (gastroesophageal reflux disease)     exacerbating VCD  . Hyperthyroidism     with hot nodule.  - total thyroidectomy 11-05-08 benign     Family History  Problem Relation Age of Onset  . Asthma Mother   . Asthma Grandchild   . Emphysema Father   . Heart disease Father   . Heart disease Mother      History   Social History  . Marital  Status: Married    Spouse Name: N/A    Number of Children: N/A  . Years of Education: N/A   Occupational History  . owns a plumbing business    Social History Main Topics  . Smoking status: Never Smoker   . Smokeless tobacco: Never Used  . Alcohol Use: Yes     occasional wine or beer  . Drug Use: No  . Sexually Active: Not on file   Other Topics Concern  . Not on file   Social History Narrative  . No narrative on file     Allergies  Allergen Reactions  . Codeine     REACTION: itching  . Erythromycin     REACTION: nausea and vomiting  . Sulfonamide Derivatives     REACTION: nausea and vomiting  . Tramadol Hcl     REACTION: itching     Outpatient Prescriptions Prior to Visit  Medication Sig Dispense Refill  . Acetaminophen (TYLENOL ARTHRITIS PAIN PO) Take by mouth as needed.        . ALPRAZolam (XANAX) 0.25 MG tablet Take 0.25 mg by mouth as needed.        Marland Kitchen  amitriptyline (ELAVIL) 50 MG tablet Take 50 mg by mouth at bedtime.        . Bimatoprost (LUMIGAN) 0.01 % SOLN Apply to eye.        Marland Kitchen dexlansoprazole (DEXILANT) 60 MG capsule Take 60 mg by mouth daily.        . digoxin (LANOXIN) 0.25 MG tablet Take 250 mcg by mouth daily.        . furosemide (LASIX) 20 MG tablet Take 20 mg by mouth daily.        Marland Kitchen levothyroxine (SYNTHROID, LEVOTHROID) 75 MCG tablet Take 75 mcg by mouth daily.        . metoprolol succinate (TOPROL-XL) 25 MG 24 hr tablet Take 25 mg by mouth daily.        . ranitidine (ZANTAC) 300 MG tablet Take 300 mg by mouth at bedtime.            Review of Systems ROS Constitutional:   No  weight loss, night sweats,  Fevers, chills, fatigue, lassitude. HEENT:   Notes  headaches, no  Difficulty swallowing,  Tooth/dental problems, sl scratchy  Sore throat,                occ sneezing, no  itching,  No  ear ache, no  nasal congestion, no post nasal drip,   CV:  No chest pain,  Orthopnea, PND, swelling in lower extremities, anasarca, dizziness,  palpitations  GI  No heartburn, indigestion, abdominal pain, nausea, vomiting, diarrhea, change in bowel habits, loss of appetite  Resp: Notes  shortness of breath with exertion and sl  at rest.  No excess mucus, no productive cough,  Notes a sl  non-productive cough but is better than before,  No coughing up of blood.  No change in color of mucus.  No wheezing.  No chest wall deformity  Skin: no rash or lesions.  GU: no dysuria, change in color of urine, no urgency or frequency.  No flank pain.  MS:  No joint pain or swelling.  No decreased range of motion.  No back pain.  Psych:  No change in mood or affect. No depression or anxiety.  No memory loss.     Objective:   Physical Exam Gen: Pleasant, well-nourished, in no distress,  normal affect  ENT: No lesions,  mouth clear,  oropharynx clear, no postnasal drip  Neck: No JVD, no TMG, no carotid bruits  Lungs: No use of accessory muscles, no dullness to percussion, clear without rales or rhonchi  Cardiovascular: RRR, heart sounds normal, no murmur or gallops, no peripheral edema  Abdomen: soft and NT, no HSM,  BS normal  Musculoskeletal: No deformities, no cyanosis or clubbing  Neuro: alert, non focal  Skin: Warm, no lesions or rashes        Assessment & Plan:

## 2010-12-02 NOTE — Patient Instructions (Signed)
Trial xyzal one daily ,we may have to do a prior auth on this No other medication changes No antibiotics Return two months [place]

## 2010-12-03 NOTE — Assessment & Plan Note (Addendum)
Stable upper airways today.  No overt pharyngitis seen Plan  cont maintenance meds Trial xyzal

## 2010-12-06 ENCOUNTER — Encounter: Payer: Self-pay | Admitting: Cardiology

## 2010-12-06 ENCOUNTER — Ambulatory Visit (INDEPENDENT_AMBULATORY_CARE_PROVIDER_SITE_OTHER): Payer: BC Managed Care – PPO | Admitting: Cardiology

## 2010-12-06 DIAGNOSIS — I951 Orthostatic hypotension: Secondary | ICD-10-CM

## 2010-12-06 DIAGNOSIS — I4891 Unspecified atrial fibrillation: Secondary | ICD-10-CM

## 2010-12-06 DIAGNOSIS — I480 Paroxysmal atrial fibrillation: Secondary | ICD-10-CM

## 2010-12-06 NOTE — Assessment & Plan Note (Signed)
Resolved with stopping her spironolactone. We'll continue with low-dose Lasix therapy.

## 2010-12-06 NOTE — Progress Notes (Signed)
Kaitlyn Adams Date of Birth: 1950/12/09   History of Present Illness:  Kaitlyn Adams is seen for followup today. She states she has done better over the past month. She still gets some palpitations when she is extremely tired or stressed. She does feel "draggy" much of the time. She has had no syncope. She denies any significant chest pain. She does still have some swelling in her hands and feet which is worse at the end of the day.  Current Outpatient Prescriptions on File Prior to Visit  Medication Sig Dispense Refill  . Acetaminophen (TYLENOL ARTHRITIS PAIN PO) Take by mouth as needed.        . ALPRAZolam (XANAX) 0.25 MG tablet Take 0.25 mg by mouth as needed.        Marland Kitchen amitriptyline (ELAVIL) 50 MG tablet Take 50 mg by mouth at bedtime.        . Bimatoprost (LUMIGAN) 0.01 % SOLN Apply to eye.        Marland Kitchen dexlansoprazole (DEXILANT) 60 MG capsule Take 60 mg by mouth daily.        . digoxin (LANOXIN) 0.25 MG tablet Take 250 mcg by mouth daily.        . furosemide (LASIX) 20 MG tablet Take 20 mg by mouth daily.        Marland Kitchen levocetirizine (XYZAL) 5 MG tablet Take 1 tablet (5 mg total) by mouth every evening.  30 tablet  11  . levothyroxine (SYNTHROID, LEVOTHROID) 75 MCG tablet Take 75 mcg by mouth daily.        . meloxicam (MOBIC) 15 MG tablet 1 tablet daily as needed      . metoprolol succinate (TOPROL-XL) 25 MG 24 hr tablet Take 25 mg by mouth daily.        . ranitidine (ZANTAC) 300 MG tablet Take 300 mg by mouth at bedtime.          Allergies  Allergen Reactions  . Codeine     REACTION: itching  . Erythromycin     REACTION: nausea and vomiting  . Sulfonamide Derivatives     REACTION: nausea and vomiting  . Tramadol Hcl     REACTION: itching    Past Medical History  Diagnosis Date  . Bronchitis     recurrent  . Paroxysmal atrial fibrillation   . Renal insufficiency   . Hypothyroidism   . Glaucoma   . Hypotension     orthostatic  . Fibromyalgia   . Osteoarthritis   .  Hypertension   . Vasomotor rhinitis     exacerbates VCD  . Asthma     - normal spirometry 2009 FEV1  >90% predicted.  - methacoline challenge neg 11/09  . Vocal cord dysfunction     proven on FOB 9/09  . Chronic cough     - sinus CT 03-23-10 > neg.  - allergy profile 03-23-10 > nl, IgE 14.  - flutter valve rx 03-23-10  . GERD (gastroesophageal reflux disease)     exacerbating VCD  . Hyperthyroidism     with hot nodule.  - total thyroidectomy 11-05-08 benign    Past Surgical History  Procedure Date  . Abdominal hysterectomy 1980  . Scar tissue removal 1982  . Thyroidectomy 11-05-08  . Basal cell carcinoma excision 11/08    History  Smoking status  . Never Smoker   Smokeless tobacco  . Never Used    History  Alcohol Use  . 0.6 oz/week  . 1 Glasses of  wine per week    occasional wine or beer    Family History  Problem Relation Age of Onset  . Asthma Mother   . Heart disease Mother   . Asthma Grandchild   . Emphysema Father   . Heart disease Father     Review of Systems: The review of systems is positive for headaches which occur every day. She also complains of occasional shortness of breath.  All other systems were reviewed and are negative.  Physical Exam: BP 114/84  Pulse 76  Ht 5\' 4"  (1.626 m)  Wt 118 lb (53.524 kg)  BMI 20.25 kg/m2 She is a chronically ill-appearing female in no acute distress. HEENT exam is unremarkable. She has no JVD or bruits. Lungs are clear. Cardiac exam reveals a regular rate and rhythm without gallop, murmur, or click. Femoral and pedal pulses are palpable. She has no significant edema. Skin reveals somewhat diffuse erythema and thickening. Neurologic exam is nonfocal.  LABORATORY DATA: Review of her event monitor showed no significant arrhythmia episodes.  Assessment / Plan:

## 2010-12-06 NOTE — Patient Instructions (Signed)
Your event monitor looked good without significant tachycardia.  Continue current medications.  We will follow up for an office visit in 3 months.

## 2010-12-06 NOTE — Assessment & Plan Note (Signed)
Recent event monitor showed no significant breakthrough. We will continue with low-dose metoprolol and digoxin.

## 2011-01-04 NOTE — Op Note (Signed)
NAMEDEANIE, JUPITER            ACCOUNT NO.:  [id number]   MEDICAL RECORD NO.:  [id number]          PATIENT TYPE:  OIB   LOCATION:  5151                         FACILITY:  [facility]   PHYSICIAN:  Lennie Muckle, MD      DATE OF BIRTH:  26-Feb-1951   DATE OF PROCEDURE:  11/05/2008  DATE OF DISCHARGE:                               OPERATIVE REPORT   PREOPERATIVE DIAGNOSIS:  Goiter with bilateral nodules.   POSTOPERATIVE DIAGNOSIS:  Goiter with bilateral nodules.   PROCEDURE:  Total thyroidectomy.   SURGEON:  Lennie Muckle, MD.   ASSISTANTOllen Gross. Vernell Morgans, M.D.   FINDINGS:  Bilateral nodules.   SPECIMEN:  Thyroid, a stitch mark in the right upper pole.   COMPLICATIONS:  No immediate complications.   BLOOD LOSS:  Minimal amount of blood loss.   INDICATIONS FOR PROCEDURE:  Ms. Seabolt is a 60 year old female who  was seen for multinodular goiter.  She had hyperthyroidism.  She had an  FNA of the bilateral nodules with follicular cells with a low risk of  having a malignancy, but due to her hyperthyroidism and questionable  nodular disease, she was advised to have a thyroidectomy versus  radioactive ablation.  I discussed preoperatively risk of surgery  including recurrent laryngeal nerve injury, hypocalcemia etc.  Informed  consent was obtained prior to procedure.   DETAILS OF PROCEDURE:  Ms. Wojdyla identified in the preoperative  holding suite.  Questions were answered.  She received 2 g of Ancef and  was taken to the operating room.  Once in the operating room, she was  placed in supine position.  After administration of general endotracheal  anesthesia, her anterior neck was prepped and draped in the usual  sterile fashion.  A time-out procedure indicating the patient and  procedure were performed.  I placed an incision 2-finger breadths above  the sternal notch.  A 6-cm incision was placed.  I dissected the  subcutaneous tissues with electrocautery.  I divided the  platysma muscle  and created a superior and inferior flap up to the cricoid cartilage and  inferior down to the sternal notch.  I then divided the strap muscles in  the middle.  Began dissecting on the left side, elevating the strap  muscle off the thyroid.  I was able to identify the superior pole.  A  clip was placed on the superior pole vessels and this was dissected  anteriorly.  I then continued dissecting down the thyroid gland.  The  inferior pole vessels were also taken.  The inferior left parathyroid  gland was identified and gently pushed away from the thyroid.  We  continued dissecting in the middle of the thyroid gland, identified the  recurrent laryngeal nerve and stayed a safe distance away from this.  Smaller vessels were clipped and using the harmonic scalpel, dissected  the thyroid off of the trachea.  Once, I was at safe distance away from  the recurrent laryngeal nerve, I continued removing the thyroid with the  electrocautery.  Once the specimen was fully removed into the midline, I  then  began dissecting on the right side of the patient.  The strap  muscles were elevated in the same fashion with blunt dissection.  The  right pole did extend somewhat more superiorly, however, we gently  retracted on this and placed the clips on these superior pole vessels.  I did leave a remnant of thyroid superiorly.  I tied this off with a 2-0  silk tie.  I dissected the thyroid away from the trachea.  The superior  parathyroid gland was spared.  Then, the dissection in the middle area  was somewhat little more difficult than the left side.  I then dissected  the inferior pole.  There was a glistening white substance which  possible were recurrent laryngeal nerve seen  superficial.  I stayed  away from this and continued dissecting the inferior pole vessels and  came medially towards the middle vessel.  I clipped the middle artery  and stapled the thyroid gland and dissected this with  a harmonic  scalpel.  Once again, once I was at a safe distance away from the  vicinity of the recurrent laryngeal nerve, I dissected with the  electrocautery.  Specimen was oriented with a stitch on the superior  pole.  We irrigated bilaterally.  Small vessel clipped on the left.  Final irrigation revealed no evidence of bleeding.  Surgicel was placed  in the bed of dissection.  I then reapproximated the strap muscles using  a 3-0 Vicryl interrupted fashion.  Platysma muscle was also  reapproximated using a 3-0 Vicryl.  Skin was closed with 4-0 Monocryl.  I injected approximately 16 mL of Marcaine into the skin.  Dermabond was  placed for final dressing.  The patient was extubated and then  transported to Post-anesthesia Care Unit in stable condition.  She will  be monitored overnight, started on Synthroid 100 mcg.  Will be given  Vicodin for pain.  There were palpable nodules bilaterally, right was  greater than left.      Lennie Muckle, MD  Electronically Signed     ALA/MEDQ  D:  11/05/2008  T:  11/06/2008  Job:  [id number]   cc:   Dorisann Frames, M.D.  Tammy R. Collins [place], M.D.

## 2011-01-04 NOTE — Discharge Summary (Signed)
NAMECYNITHIA, Kaitlyn Adams            ACCOUNT NO.:  [id number]   MEDICAL RECORD NO.:  [id number]          PATIENT TYPE:  OIB   LOCATION:  5151                         FACILITY:  [facility]   PHYSICIAN:  Lennie Muckle, MD      DATE OF BIRTH:  [birth year]   DATE OF ADMISSION:  11/05/2008  DATE OF DISCHARGE:  11/06/2008                               DISCHARGE SUMMARY   FINAL DIAGNOSES:  Nodular thyroid and goiter.   PROCEDURE:  Total thyroidectomy on November 05, 2008.   HOSPITAL COURSE:  [name] had undergone a total thyroidectomy for  nodular goiter and hyperthyroidism.  She was monitored overnight for  airway issues and to ensure she did not develop hematoma.  She did have  some mild hoarseness of her voice and throat pain.  The throat pain was  attributed to sinus strain as well as the endotracheal tube.  I had  given her Vicodin for pain, this was able to be crushed.  She did have  some mild pain requiring Morphine overnight.  She has had no nausea.  The morning of discharge, she continues to have some hoarseness of her  voice, both nerves were identified, so I think likely this is from the  endotracheal tube, some mild sinus discharge as well as some swelling  from the dissection.  Her calcium has been stable at 8.8 yesterday and  then 9.2 the morning of discharge.  Given her Tums 2 t.i.d. to take for  2-3 weeks and just follows it up with me.  She is being started on  Synthroid 100 mcg, and she is being given oxycodone Elixir for pain.  I  have instructed her to take over-the-counter stool softeners to aid with  constipation.  I will get a TSH in approximately 4 weeks' time and we  will likely have Dr. Talmage Nap and follow her.  We will wait on the final  pathology of whether or not she needs to have ablation of the remaining  of her thyroid, but more than likely this will be a benign etiology.      Lennie Muckle, MD  Electronically Signed     ALA/MEDQ  D:  11/06/2008  T:   11/07/2008  Job:  [id number]   cc:   Dorisann Frames, M.D.

## 2011-01-04 NOTE — H&P (Signed)
NAMEARIENNE, Kaitlyn Adams NO.:  [id number]   MEDICAL RECORD NO.:  [id number]          PATIENT TYPE:  INP   LOCATION:  5018                         FACILITY:  [facility]   PHYSICIAN:  Theodosia Paling, MD    DATE OF BIRTH:  10-12-1950   DATE OF ADMISSION:  04/01/2009  DATE OF DISCHARGE:                              HISTORY & PHYSICAL   PRIMARY CARE PHYSICIAN:  Tammy R. Collins [place], M.D.   CHIEF COMPLAINTS:  Rash, headache, nausea and vomiting.   HISTORY OF PRESENT ILLNESS:  Ms. Winterhalter is a very pleasant 60-year-  old lady with a past medical history of iatrogenic hypothyroidism,  status post thyroidectomy in March 2010, fibromyalgia, history of  paroxysmal atrial fibrillation, and vocal cord dysfunction and reactive  airway disease, who was in her usual state of health for the last 2  months, and she has off-and-on intermittent cough, which according to  her is not new.  She has bronchitis and reactive airway disease.  However, 2 weeks back, she went to [place] and now she has returned.  Since Monday, she has been experiencing nausea, a general feeling of  malaise.  Along with that worsening of the cough, dry cough, on Sunday  she noticed nausea.  Today she vomited and also noticed rash in her  thighs.  Now the rash is all over her body.  This prompted her to seek  further evaluation and management in the ER, where she was found to have  leukocytosis, questionable UTI.  [facility] were  contacted given her rash, headache, nausea, and leukocytosis.   Review of systems is positive, in addition to above, for headache and  neck pain.   PAST MEDICAL HISTORY:  1. Paroxysmal atrial fibrillation.  2. Hypothyroidism since thyroidectomy.  3. Fibromyalgia.  4. GERD.   PAST SURGICAL HISTORY:  1. Eye surgery, cataract on the right side.  2. Hysterectomy in the 1980s.  3. Thyroidectomy in March 2010 for benign nodule.   HOME MEDICATIONS:  Include the following:  1. Tylenol Arthritis 1 tablet p.o. daily.  2. Zantac 300 p.o. daily.  3. Digoxin 0.25 mg p.o. daily.  4. Synthroid 50 mcg p.o. daily.  5. Dexilant 1 tablet p.o. daily.  6. Xanax 0.25 mg 1/2 to 1 tablet p.o. q.12h. p.r.n.  7. Endal HD 5 mg/ml p.o. q.4-6h. p.r.n.  8. Aldactone 25 mg p.o. daily.  9. Lasix 20 mg p.o. q.p.m. p.r.n.  10.Synthroid 75 mcg on Saturday and Sunday every day.  11.Synthroid 50 mcg p.o. daily for rest of the days.  12.Diltiazem extended release 180 mg p.o. daily.   SOCIAL HISTORY:  The patient denies tobacco or alcohol or IV drug abuse.  Occasionally drinks wine.  Denies cocaine or marijuana.   FAMILY HISTORY:  Significant for hypertension, CAD, COPD.  Dad had  leukemia.   ALLERGIES:  The patient is allergic to SULFA, ANYTHING THAT ENDS WITH  MYCIN, SULFA, CODEINE.  She avoids NSAIDS, as she has a history of renal  insufficiency.   PHYSICAL EXAMINATION:  GENERAL:  No acute cardiorespiratory distress.  HEENT: EOMs intact.  Dry, coated mucous  membrane.  LUNGS:  Normal breath sounds.  No rhonchi, no crepitations.  CARDIOVASCULAR:  S1 and S2 normal.  No murmur or gallop.  GI: Soft, nontender.  No organomegaly.  EXTREMITIES: No pedal edema.  PSYCH:  Oriented x3.  CNS: Speech intact.  Follows commands.  SKIN:  The patient has diffuse morbilliform rash, which is sparing palm  and sole.   LAB DATA:  WBC 21.1, hemoglobin 15.1, hematocrit 44.1, platelet count  314, neutrophils 92%, lymphocytes 19.5%, monocytes 1%.  Neutrophils  19.5%.  Basic metabolic panel:  Sodium 136, potassium 4.0, chloride 99,  bicarbonate 28, glucose 115, BUN 9, creatinine 1.12, calcium 10.  Urinalysis:  Orange, hazy urine with small leukocytes and 7 to 10  bacteria.   CHEST X-RAY:  Normal heart size.  Mediastinal contours.  No acute  cardiopulmonary process.  Mild chronic bronchitis changes.  No acute  abnormality.   ASSESSMENT AND PLAN:  1. Nausea, vomiting, diffuse rash:  At this time,  the patient appears      to have to viral exanthem kind of presentation.  I doubt it is      [facility] spotted fever, given the sparing of the palms and      soles; however, still the patient has received doxycycline.      Meningitis is also in the differential, giving a low-grade fever,      headache, neck pain and nausea, vomiting with a rash.  I will give      the patient high-dose ceftriaxone and lumbar puncture in the      morning.  Again, I think it is low in differential, given absence      of photophobia and no neck stiffness; however, we would cover the      patient presumptively with Rocephin and do the lumbar puncture in      the morning.  As I said, my impression at this time it is viral      exanthem illness.  Infectious disease consult can be obtained in      the morning.  Blood and urine culture has already been sent.  2. Paroxysmal atrial fibrillation:  For some reason, the patient is      not on Coumadin.  We will continue digoxin and Diltiazem.  We will      obtain an echocardiogram to make sure the patient does not have a      clot.  The patient will follow-up with Dr. [place] as an outpatient.  3. Hypothyroidism:  Continue home dose of Synthroid.  4. Gastroesophageal reflux disease:  Continue PPI.  5. Prophylaxis deep venous thrombosis and gastrointestinal:  On board.  6. Code status.  The patient is full code.   Total time spent in admission of this patient around 1 hour.      Theodosia Paling, MD  Electronically Signed     NP/MEDQ  D:  04/01/2009  T:  04/02/2009  Job:  [id number]   cc:   Tammy R. Collins [place], M.D.  Peter M. [place], M.D.

## 2011-01-04 NOTE — Discharge Summary (Signed)
Kaitlyn Adams, Kaitlyn Adams            ACCOUNT NO.:  [id number]   MEDICAL RECORD NO.:  [id number]          PATIENT TYPE:  INP   LOCATION:  5018                         FACILITY:  [facility]   PHYSICIAN:  Charlestine Massed, MDDATE OF BIRTH:  07/15/1951   DATE OF ADMISSION:  04/01/2009  DATE OF DISCHARGE:  04/05/2009                               DISCHARGE SUMMARY   PRIMARY CARE PHYSICIAN:  Tammy R. Collins Scotland, MD   INFECTIOUS DISEASE CONSULTANT:  Stann Mainland. Sampson Goon, MD   CHIEF COMPLAINT/REASON FOR ADMISSION:  Rash, headache, nausea, and  vomiting.   DISCHARGE DIAGNOSES:  1. Diffuse macular rash, which is not palpable, but of unknown origin      - all tests being negative so far.  2. History of paroxysmal atrial fibrillation, currently stable not on      anticoagulation.  3. Hypothyroidism, status post thyroidectomy.  4. Fibromyalgia.  5. Gastroesophageal reflux disease  6. 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   DISCHARGE MEDICATIONS:  1. Zantac 300 mg p.o. daily.  2. Dexilant 60 mg p.o. daily.  3. Xanax 0.25 mg by mouth twice daily as needed for anxiety.  4. Endal HD 5 mg/mL q.6 h. p.r.n. for cough.  5. Aldactone 25 mg p.o. daily.  6. Synthroid 75 mcg on Saturday and Sunday and 50 mcg on all other      days by mouth.  7. Diltiazem ER 180 mg p.o. daily.  8. Avelox 400 mg p.o. daily for 3 days.  9. Doxycycline 100 mg p.o. twice daily on empty stomach for 6 days.  10.Celexa 20 mg p.o. daily.  11.Lasix currently stopped for the next 3 days and can be resumed      after 3 days at 20 mg p.o. daily.   HOSPITAL COURSE:  1. Macular rash.  The patient presented with diffuse macular rash of 2      days' duration.  The patient has been in [place] 2 weeks back on a      cruise, which landed in a small town at [address] of [place] where      she spent 6 hours and came back.  She did eat at a  restaurant there      and went for shopping there and other than that she did not stay      any other time in [place].  These people who ate with her in the      same restaurant including husband did not get sick.  Everybody came      back to [place] and after that nobody in the same group has      gotten sick except her.   The patient says that she had some fever and mild cough, which is  different from her usual cough and she was not making any sputum.  The  fever was very low-grade.  There are no chills.  She did not have any  urinary symptoms.  She did have 1 whole day of nausea and vomiting  and  headache.   So, the patient was initially evaluated as questionable meningitis, rule  out meningitis, and so she had an lumbar puncture done, which is  negative for meningitis.  She on CAT scan also did not reveal any  intracranial issues.   The patient in view of that was continued on Rocephin and doxycycline  for possible rash producing infections such as Lyme disease, [facility] spotted fever.  The patient had the rash was totally sparing  the palms and soles, and so [facility] spotted fever ruled out.  Also, the rash was diffuse and macular in nature and not at all papular,  so the possibilities of whether she has lupus or she has Parvovirus B 19  infection was thought about.   The patient stated that once a long time ago, somebody told her that she  has lupus, so lupus testing was done, which found to be negative by ANA  and dsDNA and anti-Smith DNA, which are all negative for lupus, so it  labeled that the patient does not have systemic lupus erythematosus.   She was continued on Rocephin and doxycycline, was seen by Dr.  Sampson Goon for Infectious Diseases, who ordered malaria smear, which was  negative for malaria.  She had no other features of any Lyme disease as  she did not have any typical rash or erythema marginatum, and there were  no cardiac issues also.   [facility] spotted fever serology was  negative and erythrocyte sedimentation rate was also negative.   The patient's rash improved and her clinical condition also informed,  but she still had some persistent rash.  She was seen by Dr. Sampson Goon,  who said that he will see her in the office this week and so the patient  has been discharged.  She will be continued on doxycycline for another 6  days and Avelox for the next 4 days.  Currently clinically, she is  stable.  She does not have any fever, headache, nausea, or vomiting.   1. Paroxysmal atrial fibrillation.  Currently, it is stable.  She was      found to be in normal sinus rhythm at this time and she is      continued on her existing medications.  She has not been on aspirin      before and she is following with her cardiologist.  I will continue      digoxin and Aldactone for that and diltiazem also as per the      previous list, which she was taking before.  2. Hypothyroidism.  Continue Synthroid and follow up with TSH levels.      Currently, TSH levels were stable and she needs to follow up as      outpatient for it.  3. Anxiety and depression.  She has been taking Xanax.  We also added      Celexa for control of her symptoms and for symptoms of      fibromyalgia.   TESTS DONE DURING THIS ADMISSION:  1. CAT scan of the head without contrast, no evidence of any bleed,      mass, lesions, or any other acute intracranial processes.  2. Chest x-ray, negative for cardiopulmonary disease.  3. Erythrocyte sedimentation rate on August 11 is 7.  4. Blood cultures on August 11, two sets were negative and no growth.  5. CSF study shows protein of 55 and glucose of 67 and cell count  showing 7-10 RBCs and 2 WBCs which is normal.  The protein in the      CSF possibly resulted from the RBCs, and CSF fluid albumin is less      than 1.0.  CSF smear and culture did not reveal any organisms.  6. Liver function panel; total bilirubin  0.4, direct is less than 0.1,      alk phos 55, AST 21, ALT 17, total  protein 5, and albumin 2.8.   Urine culture, no growth.   Malaria smear, total 2 sets done and it is negative for plasmodium.   ANA negative, anti-dsDNA is less than 1, and anti-Smith antibody IgG is  less than 0.2.   Parvovirus B 19 antibodies, IgG is 3.7 and IgM is less than 0.9, which  shows no evidence for infection.   Legionella antibody is negative.   DISPOSITION:  Discharged back home.   FOLLOWUP:  1. Follow up with Dr. Sampson Goon.  Call [phone] for appointment, to      be seen this week.  2. Follow up with primary care physician in 1 week.   INSTRUCTIONS:  The patient has been advised clearly about the rash and  if there is any further exacerbation of the rash, the patient has been  advised to either call Dr. Jarrett Ables office or come to the ER right  away.  Also, watch for symptoms of any bleeding or itching or any  swelling coming on the rash.  The patient has exhibited understanding.   Total of 40 minutes spent on this discharge.      Charlestine Massed, MD  Electronically Signed     UT/MEDQ  D:  04/06/2009  T:  04/06/2009  Job:  [id number]   cc:   Tammy R. Collins Scotland, M.D.  Mick Sell, MD

## 2011-01-04 NOTE — Consult Note (Signed)
NAMEFOREVER, ARECHIGA NO.:  [id number]   MEDICAL RECORD NO.:  [id number]          PATIENT TYPE:  INP   LOCATION:  5018                         FACILITY:  [facility]   PHYSICIAN:  Mick Sell, MD DATE OF BIRTH:  [birth year]   DATE OF CONSULTATION:  04/02/2009  DATE OF DISCHARGE:                                 CONSULTATION   REFERRING PHYSICIAN:  Charlestine Massed, MD with [facility].   REASON FOR CONSULT:  Rash headache, nausea, vomiting, fever in a patient  recently returned from [place].   HISTORY OF PRESENT ILLNESS:  This is a very pleasant 60 year old female  with a history of hypothyroidism ,fibromyalgia, GERD, paroxysmal atrial  fibrillation, who returned July 31 from a 10-day cruise with landings in  [place], [place] and [place], [place].  She reports when she returned  from the cruise, she felt fine until August 10 when she developed a  severe headache that was in the back of her head and also behind her  eyes.  It felt like her eyeballs hurt. The pain was 8-9/10.  The next  day she developed nausea, vomiting and went to the local physician where  she was given Phenergan and Demerol.  White blood count at that time was  20,000 so the patient was admitted for further workup.  On admission,  she had blood cultures done and was started on ceftriaxone IV.  Since  that time, she has improved somewhat in terms of her nausea and  vomiting; however, her headache persists.  Today, she had a lumbar  puncture done because of a headache.  She also noticed a rash that  developed yesterday that was a faint red, non-itchy, nonpainful rash,  mainly on her extremities.   The patient had no other passengers or relatives become ill after this  trip.  She has had no unusual food intake.  She works in an office.  She  does have children and grandchildren who are around but none of them  have been ill.  She on her cruise, had 2 stops on land, one was the  [place] stop  where they went into a jungle and went through a small cave,  however, they did not think they had any mosquito bites as they used  Off.  They had no other contact with other animals.   PAST MEDICAL HISTORY:  1. Paroxysmal atrial fibrillation.  2. Hypothyroidism since thyroidectomy.  3. Fibromyalgia.  4. GERD.   PAST SURGICAL HISTORY:  1. Eye surgery and cataract in the right side.  2. Hysterectomy.  3. Thyroidectomy in March 2010 for benign nodule.   SOCIAL HISTORY:  Works in an office, is married, lives with her husband.  Has several grandchildren, no sick contacts. No tobacco, occasional  alcohol but none since returning from her trip.  She denies any IV drug  use.   FAMILY HISTORY:  Significant for hypertension, coronary artery disease,  COPD and her father had leukemia.   ALLERGIES:  The patient is allergic to sulfa, anything that ends with  mycin and codeine.  She avoids NSAIDs as she has a history  of renal  insufficiency.   REVIEW OF SYSTEMS:  Eleven systems are reviewed and are negative except  as per HPI.   PHYSICAL EXAMINATION:  VITAL SIGNS:  The patient's T-max since admission  has been 98.5, pulse 81, blood pressure 112/69, respirations of 18,  saturating 100% on room air.  Her weight is 49 kg.  GENERAL:  The patient is a pleasant female in no acute distress.  Pupils equal, round, reactive to light and accommodation.  Extraocular  movements are intact.  No conjunctivitis or conjunctival injection or  effusion.  Neck is supple.  She does have a small red area just  laterally to her left eye but this is nontender and non-nodular.  Oropharynx is clear without any thrush or oral lesions.  Dentition is  fair.  She has no anterior cervical, posterior cervical, supraclavicular  lymphadenopathy.  HEART:  Regular.  LUNGS:  Clear to auscultation.  ABDOMEN:  Soft, nontender, nondistended.  No hepatosplenomegaly.  EXTREMITIES:  She is no clubbing, cyanosis, edema   NEUROLOGIC:  She is alert, oriented x3, grossly nonfocal neuro exam.  Cranial nerves II-XII grossly intact.  SKIN:  She has very very faint maybe areas of macular rash over her arms  and legs, but per her and her husband this is fading.   LABORATORY DATA:  Her white blood count was 21,000 with a hemoglobin of  15 and a platelet count 314.  Her differential was 92% neutrophils, 6%  lymphocytes and 1% monocytes.  Her repeat CBC shows a white count down  to 11.1, hemoglobin 12.9, platelets 247.  The basic panel was within  normal limits with sodium of 136, potassium 4.0, chloride 99, bicarb 28,  glucose 115, BUN 6, creatinine 0.82.  She had a urinalysis done which  revealed ketones of 15, negative nitrite,  small leukocyte esterase, and  7-10 white cells.  [facility] spotted fever IgM and IgG were both  negative.  She had blood cultures x2 done as well as a urine culture  which is pending.  She had CSF collected with a cell count of 2 white  cells, red count 710, as it was a bloody tab.  No differential was able  to be done on the white count as they were so few in number, glucose 67,  protein was slightly elevated at 55.   <Imaging> The patient had a chest x-ray done on admission that showed  mild bronchiectatic changes in no acute abnormalities.  She also had CT  of her head which was negative for bleed or other intracranial  processes.   IMPRESSION:  A 60 year old woman relatively healthy, now with headaches,  mild rash, nausea, vomiting, low grade fevers and leukocytosis  following a trip where she returned 2 weeks ago from a cruise to [place]  and [place].  Differential for this constellation of symptoms could  include a usual case of viral gastroenteritis, enterovirus, Dengue  fever, malaria, typhoid fever, RMSF, viral hepatitis, urinary tract  infection or pyelonephritis.  She has improved with ceftriaxone normally  in terms of decreasing her headache and also decreasing her  white count  and she has remained afebrile.  I still think we need to rule out  tropical illnesses at this point and would recommend:  1. Malaria smear x3.  2. Dengue serologies.  3. Add doxycycline to ceftriaxone.  4. Continue ceftriaxone until blood cultures and urine cultures are      negative.  5. Symptomatic management of her nausea, vomiting.  6. Check LFTs to evaluate for possibility of a hepatitis A or other      LFT abnormalities.   Thank you for the consult.  Dr. Orvan Falconer will round on the patient in  the morning. Please call with questions.      Mick Sell, MD  Electronically Signed     DPF/MEDQ  D:  04/03/2009  T:  04/03/2009  Job:  [id number]

## 2011-01-04 NOTE — Op Note (Signed)
Kaitlyn Adams, REIGER            ACCOUNT NO.:  [id number]   MEDICAL RECORD NO.:  [id number]          PATIENT TYPE:  AMB   LOCATION:  CARD                         FACILITY:  [facility]   PHYSICIAN:  Charlcie Cradle. Delford Field, MD, FCCPDATE OF BIRTH:  09-Jul-1951   DATE OF PROCEDURE:  04/23/2008  DATE OF DISCHARGE:                               OPERATIVE REPORT   PROCEDURE:  Bronchoscopy.   INDICATIONS FOR PROCEDURE:  Ongoing wheezing, evaluate for upper and  lower airway inflammation.   OPERATIVE:  Charlcie Cradle. Delford Field, MD, FCCP.   ANESTHESIA:  1% Xylocaine local.   PREMEDICATION:  Fentanyl 30 mcg, Versed 5 mg IV push.   PROCEDURE:  The Pentax video bronchoscope was introduced via the right  naris.  The upper airways were visualized and revealed mild arytenoid  edema and very mild vocal cord dysfunction but not severe evidence for  reflux mediated inflammation seen.  Then the lower airway was inspected.  The entire tracheobronchial tree was inspected and revealed no  endobronchial lesions, only very mild tracheobronchitis seen without  purulent secretions.  Bronchial washings left lower lobe were obtained  for microbiologic studies.  No other specimens were obtained.   IMPRESSION:  Upper and lower airway inflammation and irritability  instability with associated cyclical cough, vocal cord dysfunction  syndrome, largely mediated by reflux disease, doubt true asthma given  previous studies.  However, penetration of acid into the lower airway  may immediate her for ongoing airway inflammation and topical  corticosteroids may still be warranted in this situation.  However,  cyclic cough control was more important and reflux treatment is  important too.   RECOMMENDATIONS:  Follow reflux protocol and cyclic cough protocol and  discontinue all inhaled medicines for now, reassess for inhaled steroid  later.      Charlcie Cradle Delford Field, MD, [facility]  Electronically Signed     PEW/MEDQ  D:  04/23/2008   T:  04/23/2008  Job:  [id number]   cc:   Sidney Ace, M.D. [facility]  [address]  [place]  [place] [address]

## 2011-02-03 ENCOUNTER — Ambulatory Visit: Payer: BC Managed Care – PPO | Admitting: Critical Care Medicine

## 2011-02-04 ENCOUNTER — Encounter: Payer: Self-pay | Admitting: Critical Care Medicine

## 2011-02-04 ENCOUNTER — Ambulatory Visit (INDEPENDENT_AMBULATORY_CARE_PROVIDER_SITE_OTHER): Payer: BC Managed Care – PPO | Admitting: Critical Care Medicine

## 2011-02-04 VITALS — BP 116/74 | HR 85 | Temp 98.2°F | Ht 64.0 in | Wt 120.0 lb

## 2011-02-04 DIAGNOSIS — R05 Cough: Secondary | ICD-10-CM

## 2011-02-04 MED ORDER — BENZONATATE 100 MG PO CAPS: 100.0000 mg | ORAL_CAPSULE | Freq: Three times a day (TID) | ORAL | Status: DC | PRN

## 2011-02-04 MED ORDER — DEXTROMETHORPHAN POLISTIREX 30 MG/5ML PO LQCR: 60.0000 mg | ORAL | Status: AC | PRN

## 2011-02-04 MED ORDER — PREDNISONE 10 MG PO TABS: ORAL_TABLET | ORAL | Status: DC

## 2011-02-04 NOTE — Patient Instructions (Signed)
Start prednisone 10mg   Take 4 for two days, three for two days, two for two days, then one for two days and stop Delsym and tessalon perles per protocol No other changes

## 2011-02-04 NOTE — Progress Notes (Signed)
Subjective:    Patient ID: Kaitlyn Adams, female    DOB: 16-Nov-1950, 60 y.o.   MRN: [id number]  HPI 59yo WF with cyclic cough and VCD with GERD and upper airway instability. No true asthma. Methacholine Challenge 11/09 Neg for reactive airway disease  November 04, 2010 9:19 AM  since last ov, was better and now is getting worse, had some tessalon left, this am was worse and sore throat and L ear hurts  Pt with palpitations and now has holter monitor.  Cough is essentially dry. There is no f/c/s.  Pt denies any , fever, chills, sweats, unintended weight loss, pleurtic or exertional chest pain, orthopnea PND, or leg swelling  Pt denies any increase in rescue therapy over baseline, denies waking up needing it or having any early am or nocturnal exacerbations of coughing/wheezing/or dyspnea.   12/03/10 Overall cough is better,  Staying at home and being careful.   Now sl scratchy throat.  A few weeks ago ?otitis : no   Was TMJ. Saw [facility].   No real pndrip.  6/15 Cough started back again, one week ago.  June 3 husband had AMI and was stressed out.   Cough is dry,  Throat is sore and scratchy.  No real pndrip.  Notes some edema in feet.  Notes some dyspnea.   Past Medical History  Diagnosis Date  . Bronchitis     recurrent  . Paroxysmal atrial fibrillation   . Renal insufficiency   . Hypothyroidism   . Glaucoma   . Hypotension     orthostatic  . Fibromyalgia   . Osteoarthritis   . Hypertension   . Vasomotor rhinitis     exacerbates VCD  . Asthma     - normal spirometry 2009 FEV1  >90% predicted.  - methacoline challenge neg 11/09  . Vocal cord dysfunction     proven on FOB 9/09  . Chronic cough     - sinus CT 03-23-10 > neg.  - allergy profile 03-23-10 > nl, IgE 14.  - flutter valve rx 03-23-10  . GERD (gastroesophageal reflux disease)     exacerbating VCD  . Hyperthyroidism     with hot nodule.  - total thyroidectomy 11-05-08 benign     Family History  Problem Relation Age of  Onset  . Asthma Mother   . Heart disease Mother   . Asthma Grandchild   . Emphysema Father   . Heart disease Father      History   Social History  . Marital Status: Married    Spouse Name: N/A    Number of Children: 1  . Years of Education: N/A   Occupational History  . owns a plumbing business   . OWNER    Social History Main Topics  . Smoking status: Never Smoker   . Smokeless tobacco: Never Used  . Alcohol Use: 0.6 oz/week    1 Glasses of wine per week     occasional wine or beer  . Drug Use: No  . Sexually Active: Not on file   Other Topics Concern  . Not on file   Social History Narrative  . No narrative on file     Allergies  Allergen Reactions  . Codeine     REACTION: itching  . Erythromycin     REACTION: nausea and vomiting  . Sulfonamide Derivatives     REACTION: nausea and vomiting  . Tramadol Hcl     REACTION: itching  Outpatient Prescriptions Prior to Visit  Medication Sig Dispense Refill  . Acetaminophen (TYLENOL ARTHRITIS PAIN PO) Take by mouth as needed.        . ALPRAZolam (XANAX) 0.25 MG tablet Take 0.25 mg by mouth as needed.        Marland Kitchen amitriptyline (ELAVIL) 50 MG tablet Take 50 mg by mouth at bedtime.        . Bimatoprost (LUMIGAN) 0.01 % SOLN Apply to eye at bedtime.       Marland Kitchen dexlansoprazole (DEXILANT) 60 MG capsule Take 60 mg by mouth daily.        . digoxin (LANOXIN) 0.25 MG tablet Take 250 mcg by mouth daily.        . furosemide (LASIX) 20 MG tablet Take 20 mg by mouth daily.        Marland Kitchen levothyroxine (SYNTHROID, LEVOTHROID) 75 MCG tablet Take 75 mcg by mouth daily.        . metoprolol succinate (TOPROL-XL) 25 MG 24 hr tablet Take 25 mg by mouth daily.        . ranitidine (ZANTAC) 300 MG tablet Take 300 mg by mouth at bedtime.        Marland Kitchen levocetirizine (XYZAL) 5 MG tablet Take 1 tablet (5 mg total) by mouth every evening.  30 tablet  11  . meloxicam (MOBIC) 15 MG tablet 1 tablet daily as needed          Review of  Systems ROS Constitutional:   No  weight loss, night sweats,  Fevers, chills, fatigue, lassitude. HEENT:   Notes  headaches, no  Difficulty swallowing,  Tooth/dental problems, sl scratchy  Sore throat,                occ sneezing, no  itching,  No  ear ache, no  nasal congestion, no post nasal drip,   CV:  No chest pain,  Orthopnea, PND, swelling in lower extremities, anasarca, dizziness, palpitations  GI  No heartburn, indigestion, abdominal pain, nausea, vomiting, diarrhea, change in bowel habits, loss of appetite  Resp: Notes  shortness of breath with exertion and sl  at rest.  No excess mucus, no productive cough,  Notes a sl  non-productive cough but is better than before,  No coughing up of blood.  No change in color of mucus.  No wheezing.  No chest wall deformity  Skin: no rash or lesions.  GU: no dysuria, change in color of urine, no urgency or frequency.  No flank pain.  MS:  No joint pain or swelling.  No decreased range of motion.  No back pain.  Psych:  No change in mood or affect. No depression or anxiety.  No memory loss.     Objective:   Physical Exam Gen: Pleasant, well-nourished, in no distress,  normal affect  ENT: No lesions,  mouth clear,  oropharynx clear, no postnasal drip  Neck: No JVD, no TMG, no carotid bruits  Lungs: No use of accessory muscles, no dullness to percussion, clear without rales or rhonchi  Cardiovascular: RRR, heart sounds normal, no murmur or gallops, no peripheral edema  Abdomen: soft and NT, no HSM,  BS normal  Musculoskeletal: No deformities, no cyanosis or clubbing  Neuro: alert, non focal  Skin: Warm, no lesions or rashes        Assessment & Plan:   Cough Cyclical cough ppt by postnasal drip syndrome and GERD also VCD Recurrent  Cyclical cough Plan Repeat cyclic cough protocol Cont ppi Reflux diet  pulse pred     Updated Medication List Outpatient Encounter Prescriptions as of 02/04/2011  Medication Sig  Dispense Refill  . Acetaminophen (TYLENOL ARTHRITIS PAIN PO) Take by mouth as needed.        . ALPRAZolam (XANAX) 0.25 MG tablet Take 0.25 mg by mouth as needed.        Marland Kitchen amitriptyline (ELAVIL) 50 MG tablet Take 50 mg by mouth at bedtime.        . Bimatoprost (LUMIGAN) 0.01 % SOLN Apply to eye at bedtime.       . cyclobenzaprine (FLEXERIL) 10 MG tablet As needed      . dexlansoprazole (DEXILANT) 60 MG capsule Take 60 mg by mouth daily.        . digoxin (LANOXIN) 0.25 MG tablet Take 250 mcg by mouth daily.        . furosemide (LASIX) 20 MG tablet Take 20 mg by mouth daily.        Marland Kitchen ibuprofen (ADVIL,MOTRIN) 800 MG tablet As needed      . levothyroxine (SYNTHROID, LEVOTHROID) 75 MCG tablet Take 75 mcg by mouth daily.        . metoprolol succinate (TOPROL-XL) 25 MG 24 hr tablet Take 25 mg by mouth daily.        . ranitidine (ZANTAC) 300 MG tablet Take 300 mg by mouth at bedtime.        . benzonatate (TESSALON) 100 MG capsule Take 1 capsule (100 mg total) by mouth 3 (three) times daily as needed for cough. Use per cough protocol  90 capsule  4  . dextromethorphan (DELSYM) 30 MG/5ML liquid Take 10 mLs (60 mg total) by mouth as needed for cough.  89 mL  0  . predniSONE (DELTASONE) 10 MG tablet Take 4 for two days, three for two days, two for two days, then one for two days and stop   20 tablet  0  . DISCONTD: levocetirizine (XYZAL) 5 MG tablet Take 1 tablet (5 mg total) by mouth every evening.  30 tablet  11  . DISCONTD: meloxicam (MOBIC) 15 MG tablet 1 tablet daily as needed

## 2011-02-05 NOTE — Assessment & Plan Note (Signed)
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

## 2011-03-17 ENCOUNTER — Encounter: Payer: Self-pay | Admitting: Critical Care Medicine

## 2011-03-17 ENCOUNTER — Ambulatory Visit (INDEPENDENT_AMBULATORY_CARE_PROVIDER_SITE_OTHER): Payer: BC Managed Care – PPO | Admitting: Critical Care Medicine

## 2011-03-17 DIAGNOSIS — K219 Gastro-esophageal reflux disease without esophagitis: Secondary | ICD-10-CM

## 2011-03-17 DIAGNOSIS — R05 Cough: Secondary | ICD-10-CM

## 2011-03-17 MED ORDER — DEXLANSOPRAZOLE 60 MG PO CPDR: 60.0000 mg | DELAYED_RELEASE_CAPSULE | Freq: Every day | ORAL | Status: DC

## 2011-03-17 MED ORDER — PREDNISONE 10 MG PO TABS: ORAL_TABLET | ORAL | Status: DC

## 2011-03-17 MED ORDER — BECLOMETHASONE DIPROPIONATE 80 MCG/ACT NA AERS: 2.0000 | INHALATION_SPRAY | Freq: Two times a day (BID) | NASAL | Status: DC

## 2011-03-17 NOTE — Progress Notes (Signed)
Subjective:    Patient ID: Mingo Amber, female    DOB: [birth year], 60 y.o.   MRN: [id number]  HPI 59yo WF with cyclic cough and VCD with GERD and upper airway instability. No true asthma. Methacholine Challenge 11/09 Neg for reactive airway disease  November 04, 2010 9:19 AM  since last ov, was better and now is getting worse, had some tessalon left, this am was worse and sore throat and L ear hurts  Pt with palpitations and now has holter monitor.  Cough is essentially dry. There is no f/c/s.  Pt denies any , fever, chills, sweats, unintended weight loss, pleurtic or exertional chest pain, orthopnea PND, or leg swelling  Pt denies any increase in rescue therapy over baseline, denies waking up needing it or having any early am or nocturnal exacerbations of coughing/wheezing/or dyspnea.   12/03/10 Overall cough is better,  Staying at home and being careful.   Now sl scratchy throat.  A few weeks ago ?otitis : no   Was TMJ. Saw [facility].   No real pndrip.  6/15 Cough started back again, one week ago.  June 3 husband had AMI and was stressed out.   Cough is dry,  Throat is sore and scratchy.  No real pndrip.  Notes some edema in feet.  Notes some dyspnea.   03/17/11 Cough got better now is worse again.    Symptoms worse for two weeks.  Notes some sore throat and scratcyhy and drainage. Notes some DOE and at rest .  Pndrip +.   HUrts all over.  No fever   Past Medical History  Diagnosis Date  . Bronchitis     recurrent  . Paroxysmal atrial fibrillation   . Renal insufficiency   . Hypothyroidism   . Glaucoma   . Hypotension     orthostatic  . Fibromyalgia   . Osteoarthritis   . Hypertension   . Vasomotor rhinitis     exacerbates VCD  . Asthma     - normal spirometry 2009 FEV1  >90% predicted.  - methacoline challenge neg 11/09  . Vocal cord dysfunction     proven on FOB 9/09  . Chronic cough     - sinus CT 03-23-10 > neg.  - allergy profile 03-23-10 > nl, IgE 14.  - flutter valve  rx 03-23-10  . GERD (gastroesophageal reflux disease)     exacerbating VCD  . Hyperthyroidism     with hot nodule.  - total thyroidectomy 11-05-08 benign     Family History  Problem Relation Age of Onset  . Asthma Mother   . Heart disease Mother   . Asthma Grandchild   . Emphysema Father   . Heart disease Father      History   Social History  . Marital Status: Married    Spouse Name: N/A    Number of Children: 1  . Years of Education: N/A   Occupational History  . owns a plumbing business   . OWNER    Social History Main Topics  . Smoking status: Never Smoker   . Smokeless tobacco: Never Used  . Alcohol Use: 0.6 oz/week    1 Glasses of wine per week     occasional wine or beer  . Drug Use: No  . Sexually Active: Not on file   Other Topics Concern  . Not on file   Social History Narrative  . No narrative on file     Allergies  Allergen Reactions  .  Codeine     REACTION: itching  . Erythromycin     REACTION: nausea and vomiting  . Sulfonamide Derivatives     REACTION: nausea and vomiting  . Tramadol Hcl     REACTION: itching     Outpatient Prescriptions Prior to Visit  Medication Sig Dispense Refill  . Acetaminophen (TYLENOL ARTHRITIS PAIN PO) Take by mouth as needed.        . ALPRAZolam (XANAX) 0.25 MG tablet Take 0.25 mg by mouth as needed.        Marland Kitchen amitriptyline (ELAVIL) 50 MG tablet Take 50 mg by mouth at bedtime.        . benzonatate (TESSALON) 100 MG capsule Take 1 capsule (100 mg total) by mouth 3 (three) times daily as needed for cough. Use per cough protocol  90 capsule  4  . Bimatoprost (LUMIGAN) 0.01 % SOLN Apply to eye at bedtime.       . cyclobenzaprine (FLEXERIL) 10 MG tablet As needed      . digoxin (LANOXIN) 0.25 MG tablet Take 250 mcg by mouth daily.        . furosemide (LASIX) 20 MG tablet Take 20 mg by mouth daily.        Marland Kitchen ibuprofen (ADVIL,MOTRIN) 800 MG tablet As needed      . levothyroxine (SYNTHROID, LEVOTHROID) 75 MCG tablet Take  75 mcg by mouth daily.        . metoprolol succinate (TOPROL-XL) 25 MG 24 hr tablet Take 25 mg by mouth daily.        . ranitidine (ZANTAC) 300 MG tablet Take 300 mg by mouth at bedtime.        Marland Kitchen dexlansoprazole (DEXILANT) 60 MG capsule Take 60 mg by mouth daily.        . predniSONE (DELTASONE) 10 MG tablet Take 4 for two days, three for two days, two for two days, then one for two days and stop   20 tablet  0      Review of Systems ROS Constitutional:   No  weight loss, night sweats,  Fevers, chills, fatigue, lassitude. HEENT:   Notes  headaches, no  Difficulty swallowing,  Tooth/dental problems, sl scratchy  Sore throat,                occ sneezing, no  itching,  No  ear ache, no  nasal congestion, no post nasal drip,   CV:  No chest pain,  Orthopnea, PND, swelling in lower extremities, anasarca, dizziness, palpitations  GI  No heartburn, indigestion, abdominal pain, nausea, vomiting, diarrhea, change in bowel habits, loss of appetite  Resp: Notes  shortness of breath with exertion and sl  at rest.  No excess mucus, no productive cough,  Notes a sl  non-productive cough but is better than before,  No coughing up of blood.  No change in color of mucus.  No wheezing.  No chest wall deformity  Skin: no rash or lesions.  GU: no dysuria, change in color of urine, no urgency or frequency.  No flank pain.  MS:  No joint pain or swelling.  No decreased range of motion.  No back pain.  Psych:  No change in mood or affect. No depression or anxiety.  No memory loss.     Objective:   Physical Exam Gen: Pleasant, well-nourished, in no distress,  normal affect  ENT: No lesions,  mouth clear,  oropharynx clear, no postnasal drip  Neck: No JVD, no TMG, no carotid  bruits  Lungs: No use of accessory muscles, no dullness to percussion, clear without rales or rhonchi  Cardiovascular: RRR, heart sounds normal, no murmur or gallops, no peripheral edema  Abdomen: soft and NT, no HSM,  BS  normal  Musculoskeletal: No deformities, no cyanosis or clubbing  Neuro: alert, non focal  Skin: Warm, no lesions or rashes        Assessment & Plan:   Cough Cyclical cough ppt by postnasal drip syndrome and GERD also VCD Recurrent flare Plan Pulse pred Cyclic cough protocol  Qnasl No abx     Updated Medication List Outpatient Encounter Prescriptions as of 03/17/2011  Medication Sig Dispense Refill  . Acetaminophen (TYLENOL ARTHRITIS PAIN PO) Take by mouth as needed.        . ALPRAZolam (XANAX) 0.25 MG tablet Take 0.25 mg by mouth as needed.        Marland Kitchen amitriptyline (ELAVIL) 50 MG tablet Take 50 mg by mouth at bedtime.        . benzonatate (TESSALON) 100 MG capsule Take 1 capsule (100 mg total) by mouth 3 (three) times daily as needed for cough. Use per cough protocol  90 capsule  4  . Bimatoprost (LUMIGAN) 0.01 % SOLN Apply to eye at bedtime.       . cyclobenzaprine (FLEXERIL) 10 MG tablet As needed      . dexlansoprazole (DEXILANT) 60 MG capsule Take 1 capsule (60 mg total) by mouth daily.  30 capsule  6  . digoxin (LANOXIN) 0.25 MG tablet Take 250 mcg by mouth daily.        . furosemide (LASIX) 20 MG tablet Take 20 mg by mouth daily.        Marland Kitchen ibuprofen (ADVIL,MOTRIN) 800 MG tablet As needed      . levothyroxine (SYNTHROID, LEVOTHROID) 75 MCG tablet Take 75 mcg by mouth daily.        . metoprolol succinate (TOPROL-XL) 25 MG 24 hr tablet Take 25 mg by mouth daily.        . ranitidine (ZANTAC) 300 MG tablet Take 300 mg by mouth at bedtime.        Marland Kitchen DISCONTD: dexlansoprazole (DEXILANT) 60 MG capsule Take 60 mg by mouth daily.        . Beclomethasone Dipropionate (QNASL) 80 MCG/ACT AERS Place 2 puffs into the nose 2 (two) times daily.  1 Inhaler  1  . predniSONE (DELTASONE) 10 MG tablet Take 4 for two days, three for two days, two for two days, then one for two days and stop   20 tablet  0  . DISCONTD: predniSONE (DELTASONE) 10 MG tablet Take 4 for two days, three for two  days, two for two days, then one for two days and stop   20 tablet  0

## 2011-03-17 NOTE — Patient Instructions (Signed)
Qnasl two puff twice daily for one week then once daily Prednisone 10mg  Take 4 for two days, three for two days, two for two days, then one for two days and stop Use cough medications as before Refills on Dexilant Return 2 months [place]

## 2011-03-19 NOTE — Assessment & Plan Note (Addendum)
Cyclical cough ppt by postnasal drip syndrome and GERD also VCD Recurrent flare Plan Pulse pred Cyclic cough protocol  Qnasl No abx

## 2011-04-16 ENCOUNTER — Other Ambulatory Visit: Payer: Self-pay | Admitting: Cardiology

## 2011-04-18 NOTE — Telephone Encounter (Signed)
escribe medication per fax request  

## 2011-05-19 ENCOUNTER — Encounter: Payer: Self-pay | Admitting: Critical Care Medicine

## 2011-05-19 ENCOUNTER — Ambulatory Visit (INDEPENDENT_AMBULATORY_CARE_PROVIDER_SITE_OTHER): Payer: BC Managed Care – PPO | Admitting: Critical Care Medicine

## 2011-05-19 VITALS — BP 122/78 | HR 86 | Temp 98.3°F | Ht 64.0 in | Wt 120.0 lb

## 2011-05-19 DIAGNOSIS — J029 Acute pharyngitis, unspecified: Secondary | ICD-10-CM

## 2011-05-19 DIAGNOSIS — R05 Cough: Secondary | ICD-10-CM

## 2011-05-19 MED ORDER — PREDNISONE 10 MG PO TABS: ORAL_TABLET | ORAL | Status: DC

## 2011-05-19 MED ORDER — BENZONATATE 100 MG PO CAPS: ORAL_CAPSULE | ORAL | Status: DC

## 2011-05-19 MED ORDER — HYDROCODONE-HOMATROPINE 5-1.5 MG/5ML PO SYRP: ORAL_SOLUTION | ORAL | Status: DC

## 2011-05-19 MED ORDER — AMOXICILLIN-POT CLAVULANATE 875-125 MG PO TABS: 1.0000 | ORAL_TABLET | Freq: Two times a day (BID) | ORAL | Status: AC

## 2011-05-19 NOTE — Patient Instructions (Signed)
Follow cough protocol Augmentin twice daily for 10days Prednisone 10mg  Take 4 for two days three for two days two for two days one for two days Return 1 month Meds sent to [facility]

## 2011-05-19 NOTE — Assessment & Plan Note (Signed)
Cyclical cough ppt by postnasal drip syndrome and GERD also VCD Associated acute pharyngitis with flare of cough Plan Cyclical cough protocol with hydromet/tessalon augmentin x 10day Prednisone x 8days taper

## 2011-05-19 NOTE — Progress Notes (Signed)
Subjective:    Patient ID: Kaitlyn Adams, female    DOB: 03/16/1951, 60 y.o.   MRN: [id number]  HPI 60 y.o.  WF with cyclic cough and VCD with GERD and upper airway instability. No true asthma. Methacholine Challenge 11/09 Neg for reactive airway disease    05/19/2011 Saw [facility] in mid August ,  rx abx and pred and this help.    Now notes sl yellow in the AM and cough is worse, Throat is scratchy and headache and feels drained.  No real pndrip.  No chest pain,    No fever.  Sl ache all over.   Past Medical History  Diagnosis Date  . Bronchitis     recurrent  . Paroxysmal atrial fibrillation   . Renal insufficiency   . Hypothyroidism   . Glaucoma   . Hypotension     orthostatic  . Fibromyalgia   . Osteoarthritis   . Hypertension   . Vasomotor rhinitis     exacerbates VCD  . Asthma     - normal spirometry 2009 FEV1  >90% predicted.  - methacoline challenge neg 11/09  . Vocal cord dysfunction     proven on FOB 9/09  . Chronic cough     - sinus CT 03-23-10 > neg.  - allergy profile 03-23-10 > nl, IgE 14.  - flutter valve rx 03-23-10  . GERD (gastroesophageal reflux disease)     exacerbating VCD  . Hyperthyroidism     with hot nodule.  - total thyroidectomy 11-05-08 benign     Family History  Problem Relation Age of Onset  . Asthma Mother   . Heart disease Mother   . Asthma Grandchild   . Emphysema Father   . Heart disease Father      History   Social History  . Marital Status: Married    Spouse Name: N/A    Number of Children: 1  . Years of Education: N/A   Occupational History  . owns a plumbing business   . OWNER    Social History Main Topics  . Smoking status: Never Smoker   . Smokeless tobacco: Never Used  . Alcohol Use: 0.6 oz/week    1 Glasses of wine per week     occasional wine or beer  . Drug Use: No  . Sexually Active: Not on file   Other Topics Concern  . Not on file   Social History Narrative  . No narrative on file     Allergies    Allergen Reactions  . Codeine     REACTION: itching  . Erythromycin     REACTION: nausea and vomiting  . Sulfonamide Derivatives     REACTION: nausea and vomiting  . Tramadol Hcl     REACTION: itching     Outpatient Prescriptions Prior to Visit  Medication Sig Dispense Refill  . Acetaminophen (TYLENOL ARTHRITIS PAIN PO) Take by mouth as needed.        . ALPRAZolam (XANAX) 0.25 MG tablet Take 0.25 mg by mouth as needed.        Marland Kitchen amitriptyline (ELAVIL) 50 MG tablet Take 50 mg by mouth at bedtime.        . Bimatoprost (LUMIGAN) 0.01 % SOLN Apply to eye at bedtime.       Marland Kitchen dexlansoprazole (DEXILANT) 60 MG capsule Take 1 capsule (60 mg total) by mouth daily.  30 capsule  6  . digoxin (LANOXIN) 0.25 MG tablet Take 250 mcg by mouth daily.        Marland Kitchen  furosemide (LASIX) 20 MG tablet Take 20 mg by mouth daily.        Marland Kitchen ibuprofen (ADVIL,MOTRIN) 800 MG tablet As needed      . levothyroxine (SYNTHROID, LEVOTHROID) 75 MCG tablet Take 75 mcg by mouth daily.        . metoprolol succinate (TOPROL-XL) 25 MG 24 hr tablet TAKE ONE TABLET BY MOUTH DAILY  30 tablet  5  . ranitidine (ZANTAC) 300 MG tablet Take 300 mg by mouth at bedtime.        . benzonatate (TESSALON) 100 MG capsule Take 1 capsule (100 mg total) by mouth 3 (three) times daily as needed for cough. Use per cough protocol  90 capsule  4  . Beclomethasone Dipropionate (QNASL) 80 MCG/ACT AERS Place 2 puffs into the nose 2 (two) times daily.  1 Inhaler  1  . cyclobenzaprine (FLEXERIL) 10 MG tablet As needed      . predniSONE (DELTASONE) 10 MG tablet Take 4 for two days, three for two days, two for two days, then one for two days and stop   20 tablet  0      Review of Systems Constitutional:   No  weight loss, night sweats,  Fevers, chills, fatigue, lassitude. HEENT:   No headaches,  Difficulty swallowing,  Tooth/dental problems,  Sore throat,                No sneezing, itching, ear ache, nasal congestion   + post nasal drip,   CV:  No  chest pain,  Orthopnea, PND, swelling in lower extremities, anasarca, dizziness, palpitations  GI  No heartburn, indigestion, abdominal pain, nausea, vomiting, diarrhea, change in bowel habits, loss of appetite  Resp: No shortness of breath with exertion or at rest.  No excess mucus, notes  productive cough,  Notes  non-productive cough,  No coughing up of blood.  No change in color of mucus.  No wheezing.  No chest wall deformity  Skin: no rash or lesions.  GU: no dysuria, change in color of urine, no urgency or frequency.  No flank pain.  MS:  No joint pain or swelling.  No decreased range of motion.  No back pain.  Psych:  No change in mood or affect. No depression or anxiety.  No memory loss.     Objective:   Physical Exam Filed Vitals:   05/19/11 0900  BP: 122/78  Pulse: 86  Temp: 98.3 F (36.8 C)  TempSrc: Oral  Height: 5\' 4"  (1.626 m)  Weight: 120 lb (54.432 kg)  SpO2: 100%    Gen: Pleasant, well-nourished, in no distress,  normal affect  ENT: No lesions,  mouth clear,  oropharynx clear, + postnasal drip, hoarse, incessant seal like barking cough  Neck: No JVD, no TMG, no carotid bruits  Lungs: No use of accessory muscles, no dullness to percussion, prominent pseudowheeze  Cardiovascular: RRR, heart sounds normal, no murmur or gallops, no peripheral edema  Abdomen: soft and NT, no HSM,  BS normal  Musculoskeletal: No deformities, no cyanosis or clubbing  Neuro: alert, non focal  Skin: Warm, no lesions or rashes       Assessment & Plan:   Cough Cyclical cough ppt by postnasal drip syndrome and GERD also VCD Associated acute pharyngitis with flare of cough Plan Cyclical cough protocol with hydromet/tessalon augmentin x 10day Prednisone x 8days taper     Updated Medication List Outpatient Encounter Prescriptions as of 05/19/2011  Medication Sig Dispense Refill  . Acetaminophen (  TYLENOL ARTHRITIS PAIN PO) Take by mouth as needed.        .  ALPRAZolam (XANAX) 0.25 MG tablet Take 0.25 mg by mouth as needed.        Marland Kitchen amitriptyline (ELAVIL) 50 MG tablet Take 50 mg by mouth at bedtime.        . benzonatate (TESSALON) 100 MG capsule Use per cough protocol  90 capsule  4  . Bimatoprost (LUMIGAN) 0.01 % SOLN Apply to eye at bedtime.       Marland Kitchen dexlansoprazole (DEXILANT) 60 MG capsule Take 1 capsule (60 mg total) by mouth daily.  30 capsule  6  . digoxin (LANOXIN) 0.25 MG tablet Take 250 mcg by mouth daily.        . furosemide (LASIX) 20 MG tablet Take 20 mg by mouth daily.        Marland Kitchen HYDROcodone-homatropine (HYDROMET) 5-1.5 MG/5ML syrup Use three times daily per cough protocol  240 mL  0  . ibuprofen (ADVIL,MOTRIN) 800 MG tablet As needed      . levothyroxine (SYNTHROID, LEVOTHROID) 75 MCG tablet Take 75 mcg by mouth daily.        . metoprolol succinate (TOPROL-XL) 25 MG 24 hr tablet TAKE ONE TABLET BY MOUTH DAILY  30 tablet  5  . ranitidine (ZANTAC) 300 MG tablet Take 300 mg by mouth at bedtime.        Marland Kitchen DISCONTD: benzonatate (TESSALON) 100 MG capsule Take 1 capsule (100 mg total) by mouth 3 (three) times daily as needed for cough. Use per cough protocol  90 capsule  4  . DISCONTD: HYDROMET 5-1.5 MG/5ML syrup Take 5 mLs by mouth as needed.       Marland Kitchen amoxicillin-clavulanate (AUGMENTIN) 875-125 MG per tablet Take 1 tablet by mouth 2 (two) times daily.  20 tablet  0  . predniSONE (DELTASONE) 10 MG tablet Take 4 for two days three for two days two for two days one for two days  20 tablet  0  . DISCONTD: Beclomethasone Dipropionate (QNASL) 80 MCG/ACT AERS Place 2 puffs into the nose 2 (two) times daily.  1 Inhaler  1  . DISCONTD: cyclobenzaprine (FLEXERIL) 10 MG tablet As needed      . DISCONTD: predniSONE (DELTASONE) 10 MG tablet Take 4 for two days, three for two days, two for two days, then one for two days and stop   20 tablet  0

## 2011-05-25 LAB — CULTURE, RESPIRATORY W GRAM STAIN

## 2011-06-15 ENCOUNTER — Ambulatory Visit (INDEPENDENT_AMBULATORY_CARE_PROVIDER_SITE_OTHER): Payer: BC Managed Care – PPO | Admitting: Adult Health

## 2011-06-15 ENCOUNTER — Encounter: Payer: Self-pay | Admitting: Adult Health

## 2011-06-15 DIAGNOSIS — J40 Bronchitis, not specified as acute or chronic: Secondary | ICD-10-CM

## 2011-06-15 MED ORDER — PREDNISONE 10 MG PO TABS: ORAL_TABLET | ORAL | Status: AC

## 2011-06-15 MED ORDER — CEFDINIR 300 MG PO CAPS: 300.0000 mg | ORAL_CAPSULE | Freq: Two times a day (BID) | ORAL | Status: AC

## 2011-06-15 MED ORDER — HYDROCODONE-HOMATROPINE 5-1.5 MG/5ML PO SYRP: 5.0000 mL | ORAL_SOLUTION | Freq: Four times a day (QID) | ORAL | Status: DC | PRN

## 2011-06-15 NOTE — Patient Instructions (Addendum)
Omnicef 300mg  Twice daily  For 7 days  Delsym 2 tsp Twice daily  For cough  Prednisone taper over next week  Begin Patanase 2 puffs At bedtime  Until sample is gone.  Begin Allegra 180mg  daily  Begin Chlortrimeton 4mg  2 At bedtime   Hydromet 1-2 tsp every 4hr as needed for cough , may make you sleepy.  Sugarless candy, water to help avoid cough and throat clearing.  NO MINTS GOAL IS TO BE COUGH FREE follow up Dr. Delford Field  In 4 weeks at [facility] office  Please contact office for sooner follow up if symptoms do not improve or worsen or seek emergency care

## 2011-06-17 ENCOUNTER — Encounter: Payer: Self-pay | Admitting: Adult Health

## 2011-06-17 DIAGNOSIS — J42 Unspecified chronic bronchitis: Secondary | ICD-10-CM | POA: Insufficient documentation

## 2011-06-17 NOTE — Assessment & Plan Note (Signed)
Acute Bronchitis with slow to resolve upper airway cough syndrome  Aim tx at GERD/AR/cough prevention.   Plan Omnicef 300mg  Twice daily  For 7 days  Delsym 2 tsp Twice daily  For cough  Prednisone taper over next week  Begin Patanase 2 puffs At bedtime  Until sample is gone.  Begin Allegra 180mg  daily  Begin Chlortrimeton 4mg  2 At bedtime   Hydromet 1-2 tsp every 4hr as needed for cough , may make you sleepy.  Sugarless candy, water to help avoid cough and throat clearing.  NO MINTS GOAL IS TO BE COUGH FREE follow up Dr. Delford Field  In 4 weeks at [facility] office  Please contact office for sooner follow up if symptoms do not improve or worsen or seek emergency care

## 2011-06-17 NOTE — Progress Notes (Signed)
Subjective:    Patient ID: Kaitlyn Adams, female    DOB: [birth year], 60 y.o.   MRN: [id number]  HPI  60 y.o.  WF with cyclic cough and VCD with GERD and upper airway instability. No true asthma. Methacholine Challenge 11/09 Neg for reactive airway disease    05/19/2011 Saw [facility] in mid August ,  rx abx and pred and this help.    Now notes sl yellow in the AM and cough is worse, Throat is scratchy and headache and feels drained.  No real pndrip.  No chest pain,    No fever.  Sl ache all over. >>cough protocol , augmentin and steroid taper   06/15/11 Acute OV Complains of productive cough(yellowish tint) SOB ,wheezing,chest tightness. Patient was seen one month ago with similar symptoms, given Augmentin and steroid taper along with a cough protocol with Hydromet and Tessalon. She only minimally got better. Feels that she still has cough with thick yellow mucus at times. Cough waxes and wanes. She is taking Dexilant and Zantac for reflux. Denies any overt reflux. Feels that she always gets some better but  cough never goes away completely.     Past Medical History  Diagnosis Date  . Bronchitis     recurrent  . Paroxysmal atrial fibrillation   . Renal insufficiency   . Hypothyroidism   . Glaucoma   . Hypotension     orthostatic  . Fibromyalgia   . Osteoarthritis   . Hypertension   . Vasomotor rhinitis     exacerbates VCD  . Asthma     - normal spirometry 2009 FEV1  >90% predicted.  - methacoline challenge neg 11/09  . Vocal cord dysfunction     proven on FOB 9/09  . Chronic cough     - sinus CT 03-23-10 > neg.  - allergy profile 03-23-10 > nl, IgE 14.  - flutter valve rx 03-23-10  . GERD (gastroesophageal reflux disease)     exacerbating VCD  . Hyperthyroidism     with hot nodule.  - total thyroidectomy 11-05-08 benign     Family History  Problem Relation Age of Onset  . Asthma Mother   . Heart disease Mother   . Asthma Grandchild   . Emphysema Father   . Heart disease  Father      History   Social History  . Marital Status: Married    Spouse Name: N/A    Number of Children: 1  . Years of Education: N/A   Occupational History  . owns a plumbing business   . OWNER    Social History Main Topics  . Smoking status: Never Smoker   . Smokeless tobacco: Never Used  . Alcohol Use: 0.6 oz/week    1 Glasses of wine per week     occasional wine or beer  . Drug Use: No  . Sexually Active: Not on file   Other Topics Concern  . Not on file   Social History Narrative  . No narrative on file     Allergies  Allergen Reactions  . Codeine     REACTION: itching  . Erythromycin     REACTION: nausea and vomiting  . Sulfonamide Derivatives     REACTION: nausea and vomiting  . Tramadol Hcl     REACTION: itching     Outpatient Prescriptions Prior to Visit  Medication Sig Dispense Refill  . Acetaminophen (TYLENOL ARTHRITIS PAIN PO) Take by mouth as needed.        Marland Kitchen  ALPRAZolam (XANAX) 0.25 MG tablet Take 0.25 mg by mouth as needed.        Marland Kitchen amitriptyline (ELAVIL) 50 MG tablet Take 50 mg by mouth at bedtime.        . benzonatate (TESSALON) 100 MG capsule Use per cough protocol  90 capsule  4  . Bimatoprost (LUMIGAN) 0.01 % SOLN Apply to eye at bedtime.       Marland Kitchen dexlansoprazole (DEXILANT) 60 MG capsule Take 1 capsule (60 mg total) by mouth daily.  30 capsule  6  . digoxin (LANOXIN) 0.25 MG tablet Take 250 mcg by mouth daily.        . furosemide (LASIX) 20 MG tablet Take 20 mg by mouth daily.        Marland Kitchen ibuprofen (ADVIL,MOTRIN) 800 MG tablet As needed      . levothyroxine (SYNTHROID, LEVOTHROID) 75 MCG tablet Take 75 mcg by mouth daily.        . metoprolol succinate (TOPROL-XL) 25 MG 24 hr tablet TAKE ONE TABLET BY MOUTH DAILY  30 tablet  5  . ranitidine (ZANTAC) 300 MG tablet Take 300 mg by mouth at bedtime.        Marland Kitchen HYDROcodone-homatropine (HYDROMET) 5-1.5 MG/5ML syrup Use three times daily per cough protocol  240 mL  0  . predniSONE (DELTASONE) 10 MG  tablet Take 4 for two days three for two days two for two days one for two days  20 tablet  0      Review of Systems  Constitutional:   No  weight loss, night sweats,  Fevers, chills, fatigue, lassitude. HEENT:   No headaches,  Difficulty swallowing,  Tooth/dental problems,  Sore throat,                No sneezing, itching, ear ache, nasal congestion   + post nasal drip,   CV:  No chest pain,  Orthopnea, PND, swelling in lower extremities, anasarca, dizziness, palpitations  GI  No heartburn, indigestion, abdominal pain, nausea, vomiting, diarrhea, change in bowel habits, loss of appetite  Resp:    No coughing up of blood.  No chest wall deformity  Skin: no rash or lesions.  GU: no dysuria, change in color of urine, no urgency or frequency.  No flank pain.  MS:  No joint pain or swelling.  No decreased range of motion.  No back pain.  Psych:  No change in mood or affect. No depression or anxiety.        Objective:   Physical Exam  Filed Vitals:   06/15/11 1026  BP: 118/72  Pulse: 102  Temp: 97.1 F (36.2 C)  TempSrc: Oral  Height: 5\' 4"  (1.626 m)  Weight: 119 lb 12.8 oz (54.341 kg)  SpO2: 100%    Gen: Pleasant, well-nourished, in no distress,  normal affect  ENT: No lesions,  mouth clear,  oropharynx clear, + postnasal drip, hoarse, barking cough , throat clearing   Neck: No JVD, no TMG, no carotid bruits  Lungs: No use of accessory muscles, no dullness to percussion, prominent pseudowheeze  Cardiovascular: RRR, heart sounds normal, no murmur or gallops, no peripheral edema  Abdomen: soft and NT, no HSM,  BS normal  Musculoskeletal: No deformities, no cyanosis or clubbing  Neuro: alert, non focal  Skin: Warm, no lesions or rashes       Assessment & Plan:   Bronchitis Acute Bronchitis with slow to resolve upper airway cough syndrome  Aim tx at GERD/AR/cough prevention.  Plan Omnicef 300mg  Twice daily  For 7 days  Delsym 2 tsp Twice daily  For cough   Prednisone taper over next week  Begin Patanase 2 puffs At bedtime  Until sample is gone.  Begin Allegra 180mg  daily  Begin Chlortrimeton 4mg  2 At bedtime   Hydromet 1-2 tsp every 4hr as needed for cough , may make you sleepy.  Sugarless candy, water to help avoid cough and throat clearing.  NO MINTS GOAL IS TO BE COUGH FREE follow up Dr. Delford Field  In 4 weeks at [facility] office  Please contact office for sooner follow up if symptoms do not improve or worsen or seek emergency care          Updated Medication List Outpatient Encounter Prescriptions as of 06/15/2011  Medication Sig Dispense Refill  . Acetaminophen (TYLENOL ARTHRITIS PAIN PO) Take by mouth as needed.        . ALPRAZolam (XANAX) 0.25 MG tablet Take 0.25 mg by mouth as needed.        Marland Kitchen amitriptyline (ELAVIL) 50 MG tablet Take 50 mg by mouth at bedtime.        . benzonatate (TESSALON) 100 MG capsule Use per cough protocol  90 capsule  4  . Bimatoprost (LUMIGAN) 0.01 % SOLN Apply to eye at bedtime.       Marland Kitchen dexlansoprazole (DEXILANT) 60 MG capsule Take 1 capsule (60 mg total) by mouth daily.  30 capsule  6  . digoxin (LANOXIN) 0.25 MG tablet Take 250 mcg by mouth daily.        . furosemide (LASIX) 20 MG tablet Take 20 mg by mouth daily.        Marland Kitchen ibuprofen (ADVIL,MOTRIN) 800 MG tablet As needed      . levothyroxine (SYNTHROID, LEVOTHROID) 75 MCG tablet Take 75 mcg by mouth daily.        . metoprolol succinate (TOPROL-XL) 25 MG 24 hr tablet TAKE ONE TABLET BY MOUTH DAILY  30 tablet  5  . ranitidine (ZANTAC) 300 MG tablet Take 300 mg by mouth at bedtime.        . cefdinir (OMNICEF) 300 MG capsule Take 1 capsule (300 mg total) by mouth 2 (two) times daily.  14 capsule  0  . HYDROcodone-homatropine (HYDROMET) 5-1.5 MG/5ML syrup Take 5 mLs by mouth every 6 (six) hours as needed for cough.  240 mL  0  . predniSONE (DELTASONE) 10 MG tablet 4 tabs for 2 days, then 3 tabs for 2 days, 2 tabs for 2 days, then 1 tab for 2 days, then  stop  20 tablet  0  . DISCONTD: HYDROcodone-homatropine (HYDROMET) 5-1.5 MG/5ML syrup Use three times daily per cough protocol  240 mL  0  . DISCONTD: predniSONE (DELTASONE) 10 MG tablet Take 4 for two days three for two days two for two days one for two days  20 tablet  0

## 2011-06-20 ENCOUNTER — Ambulatory Visit: Payer: BC Managed Care – PPO | Admitting: Critical Care Medicine

## 2011-07-18 ENCOUNTER — Encounter: Payer: Self-pay | Admitting: Critical Care Medicine

## 2011-07-18 ENCOUNTER — Ambulatory Visit (INDEPENDENT_AMBULATORY_CARE_PROVIDER_SITE_OTHER): Payer: BC Managed Care – PPO | Admitting: Critical Care Medicine

## 2011-07-18 DIAGNOSIS — R05 Cough: Secondary | ICD-10-CM

## 2011-07-18 MED ORDER — PREDNISONE 10 MG PO TABS: ORAL_TABLET | ORAL | Status: DC

## 2011-07-18 NOTE — Progress Notes (Signed)
Subjective:    Patient ID: Kaitlyn Adams, female    DOB: 1951-06-26, 60 y.o.   MRN: [id number]  HPI  60 y.o.  WF with cyclic cough and VCD with GERD and upper airway instability. No true asthma. Methacholine Challenge 11/09 Neg for reactive airway disease    05/19/2011 Saw [facility] in mid August ,  rx abx and pred and this help.    Now notes sl yellow in the AM and cough is worse, Throat is scratchy and headache and feels drained.  No real pndrip.  No chest pain,    No fever.  Sl ache all over. >>cough protocol , augmentin and steroid taper   06/15/11 Acute OV Complains of productive cough(yellowish tint) SOB ,wheezing,chest tightness. Patient was seen one month ago with similar symptoms, given Augmentin and steroid taper along with a cough protocol with Hydromet and Tessalon. She only minimally got better. Feels that she still has cough with thick yellow mucus at times. Cough waxes and wanes. She is taking Dexilant and Zantac for reflux. Denies any overt reflux. Feels that she always gets some better but  cough never goes away completely.   07/18/2011 Omnicef 300mg  Twice daily  For 7 days  Delsym 2 tsp Twice daily  For cough  Prednisone taper over next week  Begin Patanase 2 puffs At bedtime  Until sample is gone.  Begin Allegra 180mg  daily  Begin Chlortrimeton 4mg  2 At bedtime   Hydromet 1-2 tsp every 4hr as needed for cough , may make you sleepy.  Sugarless candy, water to help avoid cough and throat clearing.  NO MINTS GOAL IS TO BE COUGH FREE  Pt noted after acute OV with NP 10/12 she got better.  Did well until 5 days ago, coughing more.  Cough now occ prod yellow, not as bad as before.  No heartburn, chest does hurt.  Notes some pndrip Not taking antihistmines now.    Past Medical History  Diagnosis Date  . Bronchitis     recurrent  . Paroxysmal atrial fibrillation   . Renal insufficiency   . Hypothyroidism   . Glaucoma   . Hypotension     orthostatic  . Fibromyalgia    . Osteoarthritis   . Hypertension   . Vasomotor rhinitis     exacerbates VCD  . Asthma     - normal spirometry 2009 FEV1  >90% predicted.  - methacoline challenge neg 11/09  . Vocal cord dysfunction     proven on FOB 9/09  . Chronic cough     - sinus CT 03-23-10 > neg.  - allergy profile 03-23-10 > nl, IgE 14.  - flutter valve rx 03-23-10  . GERD (gastroesophageal reflux disease)     exacerbating VCD  . Hyperthyroidism     with hot nodule.  - total thyroidectomy 11-05-08 benign     Family History  Problem Relation Age of Onset  . Asthma Mother   . Heart disease Mother   . Asthma Grandchild   . Emphysema Father   . Heart disease Father      History   Social History  . Marital Status: Married    Spouse Name: N/A    Number of Children: 1  . Years of Education: N/A   Occupational History  . owns a plumbing business   . OWNER    Social History Main Topics  . Smoking status: Never Smoker   . Smokeless tobacco: Never Used  . Alcohol Use: 0.6 oz/week  1 Glasses of wine per week     occasional wine or beer  . Drug Use: No  . Sexually Active: Not on file   Other Topics Concern  . Not on file   Social History Narrative  . No narrative on file     Allergies  Allergen Reactions  . Codeine     REACTION: itching  . Erythromycin     REACTION: nausea and vomiting  . Sulfonamide Derivatives     REACTION: nausea and vomiting  . Tramadol Hcl     REACTION: itching     Outpatient Prescriptions Prior to Visit  Medication Sig Dispense Refill  . Acetaminophen (TYLENOL ARTHRITIS PAIN PO) Take by mouth as needed.        . ALPRAZolam (XANAX) 0.25 MG tablet Take 0.25 mg by mouth as needed.        Marland Kitchen amitriptyline (ELAVIL) 50 MG tablet Take 50 mg by mouth at bedtime.        . benzonatate (TESSALON) 100 MG capsule Use per cough protocol  90 capsule  4  . Bimatoprost (LUMIGAN) 0.01 % SOLN Apply to eye at bedtime.       Marland Kitchen dexlansoprazole (DEXILANT) 60 MG capsule Take 1 capsule (60  mg total) by mouth daily.  30 capsule  6  . digoxin (LANOXIN) 0.25 MG tablet Take 250 mcg by mouth daily.        . furosemide (LASIX) 20 MG tablet Take 20 mg by mouth daily.        Marland Kitchen ibuprofen (ADVIL,MOTRIN) 800 MG tablet As needed      . levothyroxine (SYNTHROID, LEVOTHROID) 75 MCG tablet Take 75 mcg by mouth daily.        . metoprolol succinate (TOPROL-XL) 25 MG 24 hr tablet TAKE ONE TABLET BY MOUTH DAILY  30 tablet  5  . ranitidine (ZANTAC) 300 MG tablet Take 300 mg by mouth at bedtime.            Review of Systems  Constitutional:   No  weight loss, night sweats,  Fevers, chills, fatigue, lassitude. HEENT:   No headaches,  Difficulty swallowing,  Tooth/dental problems,  Sore throat,                No sneezing, itching, ear ache, nasal congestion   + post nasal drip,   CV:  No chest pain,  Orthopnea, PND, swelling in lower extremities, anasarca, dizziness, palpitations  GI  No heartburn, indigestion, abdominal pain, nausea, vomiting, diarrhea, change in bowel habits, loss of appetite  Resp:    No coughing up of blood.  No chest wall deformity  Skin: no rash or lesions.  GU: no dysuria, change in color of urine, no urgency or frequency.  No flank pain.  MS:  No joint pain or swelling.  No decreased range of motion.  No back pain.  Psych:  No change in mood or affect. No depression or anxiety.        Objective:   Physical Exam  Filed Vitals:   07/18/11 0854  BP: 110/70  Pulse: 101  Temp: 97.9 F (36.6 C)  TempSrc: Oral  Height: 5\' 4"  (1.626 m)  Weight: 121 lb (54.885 kg)  SpO2: 99%    Gen: Pleasant, well-nourished, in no distress,  normal affect  ENT: No lesions,  mouth clear,  oropharynx clear, + postnasal drip, hoarse, barking cough , throat clearing   Neck: No JVD, no TMG, no carotid bruits  Lungs: No use of  accessory muscles, no dullness to percussion, prominent pseudowheeze  Cardiovascular: RRR, heart sounds normal, no murmur or gallops, no peripheral  edema  Abdomen: soft and NT, no HSM,  BS normal  Musculoskeletal: No deformities, no cyanosis or clubbing  Neuro: alert, non focal  Skin: Warm, no lesions or rashes       Assessment & Plan:   Cough Cyclical cough in the basis of postnasal drip syndrome and reflux disease. The patient also has chronic allergic rhinitis Plan Repeat taper prednisone Cyclical cough protocol with cough syrup and benzonatate No systemic antibiotics are indicated Continued antihistamines with Allegra and chlorpheniramine       Updated Medication List Outpatient Encounter Prescriptions as of 07/18/2011  Medication Sig Dispense Refill  . Acetaminophen (TYLENOL ARTHRITIS PAIN PO) Take by mouth as needed.        . ALPRAZolam (XANAX) 0.25 MG tablet Take 0.25 mg by mouth as needed.        Marland Kitchen amitriptyline (ELAVIL) 50 MG tablet Take 50 mg by mouth at bedtime.        . benzonatate (TESSALON) 100 MG capsule Use per cough protocol  90 capsule  4  . Bimatoprost (LUMIGAN) 0.01 % SOLN Apply to eye at bedtime.       Marland Kitchen dexlansoprazole (DEXILANT) 60 MG capsule Take 1 capsule (60 mg total) by mouth daily.  30 capsule  6  . digoxin (LANOXIN) 0.25 MG tablet Take 250 mcg by mouth daily.        . furosemide (LASIX) 20 MG tablet Take 20 mg by mouth daily.        Marland Kitchen ibuprofen (ADVIL,MOTRIN) 800 MG tablet As needed      . levothyroxine (SYNTHROID, LEVOTHROID) 75 MCG tablet Take 75 mcg by mouth daily.        . metoprolol succinate (TOPROL-XL) 25 MG 24 hr tablet TAKE ONE TABLET BY MOUTH DAILY  30 tablet  5  . ranitidine (ZANTAC) 300 MG tablet Take 300 mg by mouth at bedtime.        . predniSONE (DELTASONE) 10 MG tablet Take 4 for two days three for two days two for two days one for two days  20 tablet  0

## 2011-07-18 NOTE — Assessment & Plan Note (Signed)
Cyclical cough in the basis of postnasal drip syndrome and reflux disease. The patient also has chronic allergic rhinitis Plan Repeat taper prednisone Cyclical cough protocol with cough syrup and benzonatate No systemic antibiotics are indicated Continued antihistamines with Allegra and chlorpheniramine

## 2011-07-18 NOTE — Patient Instructions (Signed)
Prednisone 10mg  Take 4 for two days three for two days two for two days one for two days (sent to [facility] downstairs) Resume allegra 180mg  daily and chlormetron 8mg  at bedtime. (they have this downstairs in the pharmacy)  Take this for 3 weeks. Tessalon perles 1-2 every 4hours Follow the cough protocol Hydromet three times daily over the weekend  . Return 1 month

## 2011-08-25 ENCOUNTER — Ambulatory Visit (INDEPENDENT_AMBULATORY_CARE_PROVIDER_SITE_OTHER): Payer: BC Managed Care – PPO | Admitting: Critical Care Medicine

## 2011-08-25 ENCOUNTER — Encounter: Payer: Self-pay | Admitting: Critical Care Medicine

## 2011-08-25 DIAGNOSIS — R05 Cough: Secondary | ICD-10-CM

## 2011-08-25 MED ORDER — HYDROCODONE-HOMATROPINE 5-1.5 MG/5ML PO SYRP: 5.0000 mL | ORAL_SOLUTION | Freq: Four times a day (QID) | ORAL | Status: AC | PRN

## 2011-08-25 MED ORDER — PREDNISONE 10 MG PO TABS: ORAL_TABLET | ORAL | Status: DC

## 2011-08-25 NOTE — Patient Instructions (Signed)
Prednisone 10mg  Take 4 for two days three for two days two for two days one for two days Cyclic cough protocol again with tessalon and cough syrup No other med changes Return one month

## 2011-08-25 NOTE — Assessment & Plan Note (Signed)
Cyclical cough in the basis of postnasal drip syndrome and reflux disease. The patient also has chronic allergic rhinitis Exacerbated by acute influenza URI/bronchitis spell recent Plan Repeat taper prednisone Cyclical cough protocol with cough syrup and benzonatate No systemic antibiotics are indicated

## 2011-08-25 NOTE — Progress Notes (Signed)
Subjective:    Patient ID: Kaitlyn Adams, female    DOB: 06/10/1951, 61 y.o.   MRN: [id number]  HPI  61 y.o.  WF with cyclic cough and VCD with GERD and upper airway instability. No true asthma. Methacholine Challenge 11/09 Neg for reactive airway disease    11/26 Omnicef 300mg  Twice daily  For 7 days  Delsym 2 tsp Twice daily  For cough  Prednisone taper over next week  Begin Patanase 2 puffs At bedtime  Until sample is gone.  Begin Allegra 180mg  daily  Begin Chlortrimeton 4mg  2 At bedtime   Hydromet 1-2 tsp every 4hr as needed for cough , may make you sleepy.  Sugarless candy, water to help avoid cough and throat clearing.  NO MINTS GOAL IS TO BE COUGH FREE  Pt noted after acute OV with NP 10/12 she got better.  Did well until 5 days ago, coughing more.  Cough now occ prod yellow, not as bad as before.  No heartburn, chest does hurt.  Notes some pndrip Not taking antihistmines now.     08/25/2011  61 y.o. Pt got better until ? Flu .  61 y.o. 12/20: felt bad then fever 101.  Cough was exacerbated.  Never got a flu vaccine and then pos on flu vaccine.   Rx tamiflu.  61 y.o. After this cough is flaring.  Cough is prod yellow,  Some pndrip.  No real sinus pressure.  No abx as of yet.  Now no real temp.   Notes some dyspnea with exertion, throat is scratchy.   No real heartburn.  Past Medical History  Diagnosis Date  . Bronchitis     recurrent  . Paroxysmal atrial fibrillation   . Renal insufficiency   . Hypothyroidism   . Glaucoma   . Hypotension     orthostatic  . Fibromyalgia   . Osteoarthritis   . Hypertension   . Vasomotor rhinitis     exacerbates VCD  . Asthma     - normal spirometry 2009 FEV1  >90% predicted.  - methacoline challenge neg 11/09  . Vocal cord dysfunction     proven on FOB 9/09  . Chronic cough     - sinus CT 03-23-10 > neg.  - allergy profile 03-23-10 > nl, IgE 14.  - flutter valve rx 03-23-10  . GERD (gastroesophageal reflux disease)     exacerbating VCD  . Hyperthyroidism      with hot nodule.  - total thyroidectomy 11-05-08 benign     Family History  Problem Relation Age of Onset  . Asthma Mother   . Heart disease Mother   . Asthma Grandchild   . Emphysema Father   . Heart disease Father      History   Social History  . Marital Status: Married    Spouse Name: N/A    Number of Children: 1  . Years of Education: N/A   Occupational History  . owns a plumbing business   . OWNER    Social History Main Topics  . Smoking status: Never Smoker   . Smokeless tobacco: Never Used  . Alcohol Use: 0.6 oz/week    1 Glasses of wine per week     occasional wine or beer  . Drug Use: No  . Sexually Active: Not on file   Other Topics Concern  . Not on file   Social History Narrative  . No narrative on file     Allergies  Allergen Reactions  . Codeine  REACTION: itching  . Erythromycin     REACTION: nausea and vomiting  . Sulfonamide Derivatives     REACTION: nausea and vomiting  . Tramadol Hcl     REACTION: itching     Outpatient Prescriptions Prior to Visit  Medication Sig Dispense Refill  . Acetaminophen (TYLENOL ARTHRITIS PAIN PO) Take by mouth as needed.        . ALPRAZolam (XANAX) 0.25 MG tablet Take 0.25 mg by mouth as needed.        Marland Kitchen amitriptyline (ELAVIL) 50 MG tablet Take 50 mg by mouth at bedtime.        . benzonatate (TESSALON) 100 MG capsule Use per cough protocol  90 capsule  4  . Bimatoprost (LUMIGAN) 0.01 % SOLN Apply to eye at bedtime.       Marland Kitchen dexlansoprazole (DEXILANT) 60 MG capsule Take 1 capsule (60 mg total) by mouth daily.  30 capsule  6  . digoxin (LANOXIN) 0.25 MG tablet Take 250 mcg by mouth daily.        . furosemide (LASIX) 20 MG tablet Take 20 mg by mouth daily.        Marland Kitchen ibuprofen (ADVIL,MOTRIN) 800 MG tablet As needed      . levothyroxine (SYNTHROID, LEVOTHROID) 75 MCG tablet Take 75 mcg by mouth daily.        . metoprolol succinate (TOPROL-XL) 25 MG 24 hr tablet TAKE ONE TABLET BY MOUTH DAILY  30 tablet  5    . ranitidine (ZANTAC) 300 MG tablet Take 300 mg by mouth at bedtime.        . predniSONE (DELTASONE) 10 MG tablet Take 4 for two days three for two days two for two days one for two days  20 tablet  0      Review of Systems  Constitutional:   No  weight loss, night sweats,  Fevers, chills, fatigue, lassitude. HEENT:   No headaches,  Difficulty swallowing,  Tooth/dental problems,  Sore throat,                No sneezing, itching, ear ache, nasal congestion   + post nasal drip,   CV:  No chest pain,  Orthopnea, PND, swelling in lower extremities, anasarca, dizziness, palpitations  GI  No heartburn, indigestion, abdominal pain, nausea, vomiting, diarrhea, change in bowel habits, loss of appetite  Resp:    No coughing up of blood.  No chest wall deformity  Skin: no rash or lesions.  GU: no dysuria, change in color of urine, no urgency or frequency.  No flank pain.  MS:  No joint pain or swelling.  No decreased range of motion.  No back pain.  Psych:  No change in mood or affect. No depression or anxiety.        Objective:   Physical Exam  Filed Vitals:   08/25/11 0908  BP: 118/78  Pulse: 86  Temp: 97.9 F (36.6 C)  TempSrc: Oral  Height: 5\' 4"  (1.626 m)  Weight: 122 lb (55.339 kg)  SpO2: 100%    Gen: Pleasant, well-nourished, in no distress,  normal affect  ENT: No lesions,  mouth clear,  oropharynx clear, + postnasal drip, hoarse, barking cough , throat clearing   Neck: No JVD, no TMG, no carotid bruits  Lungs: No use of accessory muscles, no dullness to percussion, prominent pseudowheeze  Cardiovascular: RRR, heart sounds normal, no murmur or gallops, no peripheral edema  Abdomen: soft and NT, no HSM,  BS normal  Musculoskeletal: No deformities, no cyanosis or clubbing  Neuro: alert, non focal  Skin: Warm, no lesions or rashes       Assessment & Plan:   Cough Cyclical cough in the basis of postnasal drip syndrome and reflux disease. The patient also  has chronic allergic rhinitis Exacerbated by acute influenza URI/bronchitis spell recent Plan Repeat taper prednisone Cyclical cough protocol with cough syrup and benzonatate No systemic antibiotics are indicated        Updated Medication List Outpatient Encounter Prescriptions as of 08/25/2011  Medication Sig Dispense Refill  . Acetaminophen (TYLENOL ARTHRITIS PAIN PO) Take by mouth as needed.        . ALPRAZolam (XANAX) 0.25 MG tablet Take 0.25 mg by mouth as needed.        Marland Kitchen amitriptyline (ELAVIL) 50 MG tablet Take 50 mg by mouth at bedtime.        . benzonatate (TESSALON) 100 MG capsule Use per cough protocol  90 capsule  4  . Bimatoprost (LUMIGAN) 0.01 % SOLN Apply to eye at bedtime.       Marland Kitchen dexlansoprazole (DEXILANT) 60 MG capsule Take 1 capsule (60 mg total) by mouth daily.  30 capsule  6  . digoxin (LANOXIN) 0.25 MG tablet Take 250 mcg by mouth daily.        . furosemide (LASIX) 20 MG tablet Take 20 mg by mouth daily.        Marland Kitchen ibuprofen (ADVIL,MOTRIN) 800 MG tablet As needed      . levothyroxine (SYNTHROID, LEVOTHROID) 75 MCG tablet Take 75 mcg by mouth daily.        . metoprolol succinate (TOPROL-XL) 25 MG 24 hr tablet TAKE ONE TABLET BY MOUTH DAILY  30 tablet  5  . ranitidine (ZANTAC) 300 MG tablet Take 300 mg by mouth at bedtime.        . VENTOLIN HFA 108 (90 BASE) MCG/ACT inhaler 2 puffs as needed.       Marland Kitchen HYDROcodone-homatropine (HYDROMET) 5-1.5 MG/5ML syrup Take 5 mLs by mouth every 6 (six) hours as needed for cough.  240 mL  0  . predniSONE (DELTASONE) 10 MG tablet Take 4 for two days three for two days two for two days one for two days  20 tablet  0  . DISCONTD: predniSONE (DELTASONE) 10 MG tablet Take 4 for two days three for two days two for two days one for two days  20 tablet  0  . DISCONTD: predniSONE (DELTASONE) 10 MG tablet Take 4 for two days three for two days two for two days one for two days  20 tablet  0  . DISCONTD: TAMIFLU 75 MG capsule

## 2011-09-27 ENCOUNTER — Encounter: Payer: Self-pay | Admitting: Critical Care Medicine

## 2011-09-27 ENCOUNTER — Ambulatory Visit (INDEPENDENT_AMBULATORY_CARE_PROVIDER_SITE_OTHER): Payer: BC Managed Care – PPO | Admitting: Critical Care Medicine

## 2011-09-27 DIAGNOSIS — R05 Cough: Secondary | ICD-10-CM

## 2011-09-27 MED ORDER — AZITHROMYCIN 250 MG PO TABS: 250.0000 mg | ORAL_TABLET | Freq: Every day | ORAL | Status: AC

## 2011-09-27 MED ORDER — PREDNISONE 10 MG PO TABS: ORAL_TABLET | ORAL | Status: DC

## 2011-09-27 MED ORDER — HYDROCODONE-HOMATROPINE 5-1.5 MG/5ML PO SYRP: 2.5000 mL | ORAL_SOLUTION | ORAL | Status: DC | PRN

## 2011-09-27 NOTE — Progress Notes (Signed)
Subjective:    Patient ID: Kaitlyn Adams, female    DOB: 1950/11/20, 61 y.o.   MRN: [id number]  HPI  61 y.o.  WF with cyclic cough and VCD with GERD and upper airway instability. No true asthma. Methacholine Challenge 11/09 Neg for reactive airway disease    1/3  Pt got better until ? Flu .  12/20: felt bad then fever 101.  Cough was exacerbated.  Never got a flu vaccine and then pos on flu vaccine.   Rx tamiflu.  After this cough is flaring.  Cough is prod yellow,  Some pndrip.  No real sinus pressure.  No abx as of yet.  Now no real temp.   Notes some dyspnea with exertion, throat is scratchy.   No real heartburn.  09/27/2011 Did better,  No talking etc and the cough went away.   Has lots of stress at home.    Now min yellow mucus.  Notes sl pn drip.  Notes ache from cough.  Sl wheeze.  No fever.   No real sinus pressure Past Medical History  Diagnosis Date  . Bronchitis     recurrent  . Paroxysmal atrial fibrillation   . Renal insufficiency   . Hypothyroidism   . Glaucoma   . Hypotension     orthostatic  . Fibromyalgia   . Osteoarthritis   . Hypertension   . Vasomotor rhinitis     exacerbates VCD  . Asthma     - normal spirometry 2009 FEV1  >90% predicted.  - methacoline challenge neg 11/09  . Vocal cord dysfunction     proven on FOB 9/09  . Chronic cough     - sinus CT 03-23-10 > neg.  - allergy profile 03-23-10 > nl, IgE 14.  - flutter valve rx 03-23-10  . GERD (gastroesophageal reflux disease)     exacerbating VCD  . Hyperthyroidism     with hot nodule.  - total thyroidectomy 11-05-08 benign     Family History  Problem Relation Age of Onset  . Asthma Mother   . Heart disease Mother   . Asthma Grandchild   . Emphysema Father   . Heart disease Father      History   Social History  . Marital Status: Married    Spouse Name: N/A    Number of Children: 1  . Years of Education: N/A   Occupational History  . owns a plumbing business   . OWNER    Social  History Main Topics  . Smoking status: Never Smoker   . Smokeless tobacco: Never Used  . Alcohol Use: 0.6 oz/week    1 Glasses of wine per week     occasional wine or beer  . Drug Use: No  . Sexually Active: Not on file   Other Topics Concern  . Not on file   Social History Narrative  . No narrative on file     Allergies  Allergen Reactions  . Codeine     REACTION: itching  . Erythromycin     REACTION: nausea and vomiting  . Sulfonamide Derivatives     REACTION: nausea and vomiting  . Tramadol Hcl     REACTION: itching     Outpatient Prescriptions Prior to Visit  Medication Sig Dispense Refill  . Acetaminophen (TYLENOL ARTHRITIS PAIN PO) Take by mouth as needed.        . ALPRAZolam (XANAX) 0.25 MG tablet Take 0.25 mg by mouth as needed.        Marland Kitchen  amitriptyline (ELAVIL) 50 MG tablet Take 50 mg by mouth at bedtime.        . benzonatate (TESSALON) 100 MG capsule Use per cough protocol  90 capsule  4  . Bimatoprost (LUMIGAN) 0.01 % SOLN Apply to eye at bedtime.       Marland Kitchen dexlansoprazole (DEXILANT) 60 MG capsule Take 1 capsule (60 mg total) by mouth daily.  30 capsule  6  . digoxin (LANOXIN) 0.25 MG tablet Take 250 mcg by mouth daily.        . furosemide (LASIX) 20 MG tablet Take 20 mg by mouth daily.        Marland Kitchen ibuprofen (ADVIL,MOTRIN) 800 MG tablet As needed      . levothyroxine (SYNTHROID, LEVOTHROID) 75 MCG tablet Take 75 mcg by mouth daily.        . metoprolol succinate (TOPROL-XL) 25 MG 24 hr tablet TAKE ONE TABLET BY MOUTH DAILY  30 tablet  5  . ranitidine (ZANTAC) 300 MG tablet Take 300 mg by mouth at bedtime.        . VENTOLIN HFA 108 (90 BASE) MCG/ACT inhaler 2 puffs as needed.       . predniSONE (DELTASONE) 10 MG tablet Take 4 for two days three for two days two for two days one for two days  20 tablet  0      Review of Systems  Constitutional:   No  weight loss, night sweats,  Fevers, chills, fatigue, lassitude. HEENT:   No headaches,  Difficulty swallowing,   Tooth/dental problems,  Sore throat,                No sneezing, itching, ear ache, nasal congestion   + post nasal drip,   CV:  No chest pain,  Orthopnea, PND, swelling in lower extremities, anasarca, dizziness, palpitations  GI  No heartburn, indigestion, abdominal pain, nausea, vomiting, diarrhea, change in bowel habits, loss of appetite  Resp:    No coughing up of blood.  No chest wall deformity  Skin: no rash or lesions.  GU: no dysuria, change in color of urine, no urgency or frequency.  No flank pain.  MS:  No joint pain or swelling.  No decreased range of motion.  No back pain.  Psych:  No change in mood or affect. No depression or anxiety.        Objective:   Physical Exam  Filed Vitals:   09/27/11 0900  BP: 118/76  Pulse: 99  Temp: 97.9 F (36.6 C)  TempSrc: Oral  Height: 5\' 4"  (1.626 m)  Weight: 120 lb (54.432 kg)  SpO2: 100%    Gen: Pleasant, well-nourished, in no distress,  normal affect  ENT: No lesions,  mouth clear,  oropharynx clear, + postnasal drip, hoarse, barking cough , throat clearing   Neck: No JVD, no TMG, no carotid bruits  Lungs: No use of accessory muscles, no dullness to percussion, prominent pseudowheeze  Cardiovascular: RRR, heart sounds normal, no murmur or gallops, no peripheral edema  Abdomen: soft and NT, no HSM,  BS normal  Musculoskeletal: No deformities, no cyanosis or clubbing  Neuro: alert, non focal  Skin: Warm, no lesions or rashes       Assessment & Plan:   Cough Cyclical cough in the basis of postnasal drip syndrome and reflux disease. The patient also has chronic allergic rhinitis Exacerbated by acute influenza URI/bronchitis spell recent Plan Follow cyclic cough protocol with hydromet for 1-2 days then switch to tessalon Azithromycin  250mg  Take two once then one daily until gone Prednisone 10mg  Take 4 for two days three for two days two for two days one for two days Return 2 months        Updated  Medication List Outpatient Encounter Prescriptions as of 09/27/2011  Medication Sig Dispense Refill  . Acetaminophen (TYLENOL ARTHRITIS PAIN PO) Take by mouth as needed.        . ALPRAZolam (XANAX) 0.25 MG tablet Take 0.25 mg by mouth as needed.        Marland Kitchen amitriptyline (ELAVIL) 50 MG tablet Take 50 mg by mouth at bedtime.        . benzonatate (TESSALON) 100 MG capsule Use per cough protocol  90 capsule  4  . Bimatoprost (LUMIGAN) 0.01 % SOLN Apply to eye at bedtime.       Marland Kitchen dexlansoprazole (DEXILANT) 60 MG capsule Take 1 capsule (60 mg total) by mouth daily.  30 capsule  6  . dextromethorphan (DELSYM) 30 MG/5ML liquid Take 60 mg by mouth as needed.      . digoxin (LANOXIN) 0.25 MG tablet Take 250 mcg by mouth daily.        . furosemide (LASIX) 20 MG tablet Take 20 mg by mouth daily.        Marland Kitchen HYDROcodone-homatropine (HYDROMET) 5-1.5 MG/5ML syrup Take 2.5 mLs by mouth every 4 (four) hours as needed for cough.  180 mL  0  . ibuprofen (ADVIL,MOTRIN) 800 MG tablet As needed      . levothyroxine (SYNTHROID, LEVOTHROID) 75 MCG tablet Take 75 mcg by mouth daily.        . metoprolol succinate (TOPROL-XL) 25 MG 24 hr tablet TAKE ONE TABLET BY MOUTH DAILY  30 tablet  5  . ranitidine (ZANTAC) 300 MG tablet Take 300 mg by mouth at bedtime.        . VENTOLIN HFA 108 (90 BASE) MCG/ACT inhaler 2 puffs as needed.       Marland Kitchen DISCONTD: HYDROMET 5-1.5 MG/5ML syrup Take by mouth as needed.       Marland Kitchen azithromycin (ZITHROMAX) 250 MG tablet Take 1 tablet (250 mg total) by mouth daily. Take two once then one daily until gone  6 each  0  . predniSONE (DELTASONE) 10 MG tablet Take 4 for two days three for two days two for two days one for two days  20 tablet  0  . DISCONTD: predniSONE (DELTASONE) 10 MG tablet Take 4 for two days three for two days two for two days one for two days  20 tablet  0

## 2011-09-27 NOTE — Patient Instructions (Signed)
Follow cyclic cough protocol with hydromet for 1-2 days then switch to tessalon Azithromycin 250mg  Take two once then one daily until gone Prednisone 10mg  Take 4 for two days three for two days two for two days one for two days Return 2 months

## 2011-09-27 NOTE — Assessment & Plan Note (Addendum)
Cyclical cough in the basis of postnasal drip syndrome and reflux disease. The patient also has chronic allergic rhinitis Exacerbated by acute influenza URI/bronchitis spell recent Plan Follow cyclic cough protocol with hydromet for 1-2 days then switch to tessalon Azithromycin 250mg  Take two once then one daily until gone Prednisone 10mg  Take 4 for two days three for two days two for two days one for two days Return 2 months

## 2011-10-18 ENCOUNTER — Other Ambulatory Visit: Payer: Self-pay | Admitting: Cardiology

## 2011-10-20 ENCOUNTER — Other Ambulatory Visit: Payer: Self-pay | Admitting: Pulmonary Disease

## 2011-10-20 DIAGNOSIS — K219 Gastro-esophageal reflux disease without esophagitis: Secondary | ICD-10-CM

## 2011-10-20 MED ORDER — DEXLANSOPRAZOLE 60 MG PO CPDR: 60.0000 mg | DELAYED_RELEASE_CAPSULE | Freq: Every day | ORAL | Status: DC

## 2011-11-28 ENCOUNTER — Ambulatory Visit: Payer: BC Managed Care – PPO | Admitting: Critical Care Medicine

## 2012-01-17 ENCOUNTER — Other Ambulatory Visit: Payer: Self-pay | Admitting: *Deleted

## 2012-01-17 MED ORDER — METOPROLOL SUCCINATE ER 25 MG PO TB24: 25.0000 mg | ORAL_TABLET | Freq: Every day | ORAL | Status: DC

## 2012-02-16 ENCOUNTER — Other Ambulatory Visit: Payer: Self-pay | Admitting: *Deleted

## 2012-02-16 MED ORDER — METOPROLOL SUCCINATE ER 25 MG PO TB24: 25.0000 mg | ORAL_TABLET | Freq: Every day | ORAL | Status: DC

## 2012-02-20 ENCOUNTER — Other Ambulatory Visit: Payer: Self-pay | Admitting: Cardiology

## 2012-02-20 NOTE — Telephone Encounter (Signed)
New problem:  Patient only receive 15 pills. With no refill. Normally it's a 30 pills. Please advise.

## 2012-02-21 NOTE — Telephone Encounter (Signed)
Patient called was told the reason she only received 15 tablets of metoprolol was because she was past due for appointment.Advised to keep appointment with Norma Fredrickson NP tomorrow 02/22/12 and Lawson Fiscal would renew metoprolol prescription.

## 2012-02-22 ENCOUNTER — Encounter: Payer: Self-pay | Admitting: Nurse Practitioner

## 2012-02-22 ENCOUNTER — Other Ambulatory Visit: Payer: Self-pay | Admitting: *Deleted

## 2012-02-22 ENCOUNTER — Ambulatory Visit (INDEPENDENT_AMBULATORY_CARE_PROVIDER_SITE_OTHER): Payer: BC Managed Care – PPO | Admitting: Nurse Practitioner

## 2012-02-22 VITALS — BP 120/70 | HR 82 | Ht 64.0 in | Wt 123.0 lb

## 2012-02-22 DIAGNOSIS — R5383 Other fatigue: Secondary | ICD-10-CM

## 2012-02-22 DIAGNOSIS — R011 Cardiac murmur, unspecified: Secondary | ICD-10-CM

## 2012-02-22 DIAGNOSIS — I10 Essential (primary) hypertension: Secondary | ICD-10-CM

## 2012-02-22 DIAGNOSIS — R079 Chest pain, unspecified: Secondary | ICD-10-CM

## 2012-02-22 DIAGNOSIS — R9431 Abnormal electrocardiogram [ECG] [EKG]: Secondary | ICD-10-CM

## 2012-02-22 DIAGNOSIS — I480 Paroxysmal atrial fibrillation: Secondary | ICD-10-CM

## 2012-02-22 DIAGNOSIS — I4891 Unspecified atrial fibrillation: Secondary | ICD-10-CM

## 2012-02-22 DIAGNOSIS — R5381 Other malaise: Secondary | ICD-10-CM

## 2012-02-22 LAB — BASIC METABOLIC PANEL
BUN: 12 mg/dL (ref 6–23)
CO2: 29 mEq/L (ref 19–32)
Calcium: 9 mg/dL (ref 8.4–10.5)
Chloride: 103 mEq/L (ref 96–112)
Creatinine, Ser: 0.8 mg/dL (ref 0.4–1.2)
GFR: 81.84 mL/min (ref >60.00)
Glucose, Bld: 109 mg/dL — ABNORMAL HIGH (ref 70–99)
Potassium: 3.8 mEq/L (ref 3.5–5.1)
Sodium: 140 mEq/L (ref 135–145)

## 2012-02-22 LAB — CBC WITH DIFFERENTIAL/PLATELET
Basophils Absolute: 0.1 10*3/uL (ref 0.0–0.1)
Basophils Relative: 0.7 % (ref 0.0–3.0)
Eosinophils Absolute: 0.1 10*3/uL (ref 0.0–0.7)
Eosinophils Relative: 1.1 % (ref 0.0–5.0)
HCT: 37.2 % (ref 36.0–46.0)
Hemoglobin: 12 g/dL (ref 12.0–15.0)
Lymphocytes Relative: 22.6 % (ref 12.0–46.0)
Lymphs Abs: 1.6 10*3/uL (ref 0.7–4.0)
MCHC: 32.3 g/dL (ref 30.0–36.0)
MCV: 73.1 fl — ABNORMAL LOW (ref 78.0–100.0)
Monocytes Absolute: 0.5 10*3/uL (ref 0.1–1.0)
Monocytes Relative: 7.6 % (ref 3.0–12.0)
Neutro Abs: 5 10*3/uL (ref 1.4–7.7)
Neutrophils Relative %: 68 % (ref 43.0–77.0)
Platelets: 351 10*3/uL (ref 150.0–400.0)
RBC: 5.09 Mil/uL (ref 3.87–5.11)
RDW: 16.4 % — ABNORMAL HIGH (ref 11.5–14.6)
WBC: 7.3 10*3/uL (ref 4.5–10.5)

## 2012-02-22 MED ORDER — METOPROLOL SUCCINATE ER 25 MG PO TB24: 25.0000 mg | ORAL_TABLET | Freq: Every day | ORAL | Status: DC

## 2012-02-22 NOTE — Patient Instructions (Signed)
We are going to get an ultrasound of your heart  We are going to update your stress test Kaitlyn Adams)  I have refilled your Metoprolol  We will tentatively see you in 6 months, unless your studies are abnormal.  Exercise and staying active are encouraged  Call the [facility] office at [phone] if you have any questions, problems or concerns.

## 2012-02-22 NOTE — Assessment & Plan Note (Signed)
Blood pressure is currently ok.

## 2012-02-22 NOTE — Assessment & Plan Note (Signed)
She has had some palpitations but nothing that seems to be too bothersome for her. Her event monitor from last year showed no significant breakthrough. I have refilled her beta blocker.

## 2012-02-22 NOTE — Assessment & Plan Note (Signed)
She does have a murmur on exam today. Last echo was in 2010 and showed normal EF with mild MR. We are going to update her study.

## 2012-02-22 NOTE — Assessment & Plan Note (Signed)
Patient has an abnormal EKG. I have obtained her tracings from a prior nuclear study in 2011. Her EKG was abnormal then as well but her study was ok. She has some complaint of chest pain but more of increasing fatigue. Does have a positive family history with her father having had CABG at age 62. She reports her cholesterol levels are borderline as well. We will update her study. Will tentatively see her back in 6 months unless her studies are abnormal. Patient is agreeable to this plan and will call if any problems develop in the interim.

## 2012-02-22 NOTE — Progress Notes (Signed)
Mingo Amber Date of Birth: Jul 18, 1951 Medical Record #[id number]  History of Present Illness: Ms. Kaitlyn Adams is seen back today for a follow up visit. She is seen for Dr. [place]. She has a history of PAF and has had a event monitor last year that showed no significant breakthrough. She is on low dose metoprolol and digoxin. She had had some orthostatic hypotension and her Aldactone has been stopped. Her other issues include GERD, CRI, hypothyroidism, glaucoma, fibromyalgia, OA, HTN, asthma and hyperthyroidism. She does have some mild MR per an echo back in 2010. Her EF is normal.   She comes in today. She is here alone. She has not been seen since April of last year. Has basically done ok but notes a decrease in her energy level over the past 3 to 6 months. She doesn't really known why she is more fatigued. Some chest pains off and on that she really doesn't seem too concerned with. Does have some palpitations on occasion but she has learned to live with that. She is not exercising. Cholesterol levels are reportedly borderline. She also is mildly anemic according to her labs from earlier in the year. She sees Dr. Yehuda Budd for primary care. She will have some occasional shortness of breath that she feels is related to her asthma but she is not sure. She has recurrent bronchitis. Family history is positive for CAD. Her father did have CABG at age 62. She also needs her metoprolol refilled. She did see Dr. Horald Pollen yesterday who reported hearing a murmur.   Current Outpatient Prescriptions on File Prior to Visit  Medication Sig Dispense Refill  . Acetaminophen (TYLENOL ARTHRITIS PAIN PO) Take by mouth as needed.        . ALPRAZolam (XANAX) 0.25 MG tablet Take 0.25 mg by mouth as needed.        Marland Kitchen amitriptyline (ELAVIL) 50 MG tablet Take 50 mg by mouth at bedtime.        . benzonatate (TESSALON) 100 MG capsule Use per cough protocol  90 capsule  4  . Bimatoprost (LUMIGAN) 0.01 % SOLN Apply to eye at  bedtime.       Marland Kitchen dexlansoprazole (DEXILANT) 60 MG capsule Take 1 capsule (60 mg total) by mouth daily.  30 capsule  6  . dextromethorphan (DELSYM) 30 MG/5ML liquid Take 60 mg by mouth as needed.      . digoxin (LANOXIN) 0.25 MG tablet Take 250 mcg by mouth daily.        . furosemide (LASIX) 20 MG tablet Take 20 mg by mouth daily.        Marland Kitchen ibuprofen (ADVIL,MOTRIN) 800 MG tablet As needed      . levothyroxine (SYNTHROID, LEVOTHROID) 75 MCG tablet Take 75 mcg by mouth daily.        . ranitidine (ZANTAC) 300 MG tablet Take 300 mg by mouth at bedtime.        Marland Kitchen DISCONTD: metoprolol succinate (TOPROL-XL) 25 MG 24 hr tablet Take 1 tablet (25 mg total) by mouth daily.  15 tablet  0    Allergies  Allergen Reactions  . Codeine     REACTION: itching  . Erythromycin     REACTION: nausea and vomiting  . Sulfonamide Derivatives     REACTION: nausea and vomiting  . Tramadol Hcl     REACTION: itching    Past Medical History  Diagnosis Date  . Bronchitis     recurrent  . Paroxysmal atrial fibrillation   .  Renal insufficiency   . Hypothyroidism   . Glaucoma   . Hypotension     orthostatic  . Fibromyalgia   . Osteoarthritis   . Hypertension   . Vasomotor rhinitis     exacerbates VCD  . Asthma     - normal spirometry 2009 FEV1  >90% predicted.  - methacoline challenge neg 11/09  . Vocal cord dysfunction     proven on FOB 9/09  . Chronic cough     - sinus CT 03-23-10 > neg.  - allergy profile 03-23-10 > nl, IgE 14.  - flutter valve rx 03-23-10  . GERD (gastroesophageal reflux disease)     exacerbating VCD  . Hyperthyroidism     with hot nodule.  - total thyroidectomy 11-05-08 benign  . Abnormal EKG     Past Surgical History  Procedure Date  . Abdominal hysterectomy 1980  . Scar tissue removal 1982  . Thyroidectomy 11-05-08  . Basal cell carcinoma excision 11/08    History  Smoking status  . Never Smoker   Smokeless tobacco  . Never Used    History  Alcohol Use  . 0.6 oz/week  .  1 Glasses of wine per week    occasional wine or beer    Family History  Problem Relation Age of Onset  . Asthma Mother   . Heart disease Mother   . Asthma Grandchild   . Emphysema Father   . Heart disease Father     Review of Systems: The review of systems is per the HPI.  All other systems were reviewed and are negative.  Physical Exam: BP 120/70  Pulse 82  Ht 5\' 4"  (1.626 m)  Wt 123 lb (55.792 kg)  BMI 21.11 kg/m2 Patient is very pleasant and in no acute distress. Skin is warm and dry. Color is normal.  HEENT is unremarkable. Normocephalic/atraumatic. PERRL. Sclera are nonicteric. Neck is supple. No masses. No JVD. Lungs are clear. Cardiac exam shows a regular rate and rhythm. She does have a 2/6 systolic murmur noted today. Abdomen is soft. Extremities are without edema. Gait and ROM are intact. No gross neurologic deficits noted.  LABORATORY DATA: CBC and BMET pending for today.  Her EKG today shows sinus rhythm. She has diffuse ST and T wave changes inferiorly and laterally.    Assessment / Plan:

## 2012-02-27 ENCOUNTER — Ambulatory Visit (HOSPITAL_COMMUNITY): Payer: BC Managed Care – PPO | Attending: Cardiology | Admitting: Radiology

## 2012-02-27 DIAGNOSIS — R5381 Other malaise: Secondary | ICD-10-CM | POA: Insufficient documentation

## 2012-02-27 DIAGNOSIS — R5383 Other fatigue: Secondary | ICD-10-CM

## 2012-02-27 DIAGNOSIS — R079 Chest pain, unspecified: Secondary | ICD-10-CM

## 2012-02-27 DIAGNOSIS — I4891 Unspecified atrial fibrillation: Secondary | ICD-10-CM | POA: Insufficient documentation

## 2012-02-27 DIAGNOSIS — Z8249 Family history of ischemic heart disease and other diseases of the circulatory system: Secondary | ICD-10-CM | POA: Insufficient documentation

## 2012-02-27 DIAGNOSIS — I10 Essential (primary) hypertension: Secondary | ICD-10-CM | POA: Insufficient documentation

## 2012-02-27 DIAGNOSIS — R9431 Abnormal electrocardiogram [ECG] [EKG]: Secondary | ICD-10-CM

## 2012-02-27 DIAGNOSIS — R011 Cardiac murmur, unspecified: Secondary | ICD-10-CM

## 2012-02-27 NOTE — Progress Notes (Signed)
Echocardiogram performed.  

## 2012-03-01 ENCOUNTER — Ambulatory Visit (HOSPITAL_COMMUNITY): Payer: BC Managed Care – PPO | Attending: Internal Medicine | Admitting: Radiology

## 2012-03-01 VITALS — BP 112/74 | Ht 64.0 in | Wt 120.0 lb

## 2012-03-01 DIAGNOSIS — R079 Chest pain, unspecified: Secondary | ICD-10-CM | POA: Insufficient documentation

## 2012-03-01 DIAGNOSIS — R0602 Shortness of breath: Secondary | ICD-10-CM | POA: Insufficient documentation

## 2012-03-01 DIAGNOSIS — E785 Hyperlipidemia, unspecified: Secondary | ICD-10-CM | POA: Insufficient documentation

## 2012-03-01 DIAGNOSIS — Z8249 Family history of ischemic heart disease and other diseases of the circulatory system: Secondary | ICD-10-CM | POA: Insufficient documentation

## 2012-03-01 DIAGNOSIS — IMO0001 Reserved for inherently not codable concepts without codable children: Secondary | ICD-10-CM | POA: Insufficient documentation

## 2012-03-01 DIAGNOSIS — I10 Essential (primary) hypertension: Secondary | ICD-10-CM | POA: Insufficient documentation

## 2012-03-01 DIAGNOSIS — I4891 Unspecified atrial fibrillation: Secondary | ICD-10-CM | POA: Insufficient documentation

## 2012-03-01 DIAGNOSIS — R5383 Other fatigue: Secondary | ICD-10-CM | POA: Insufficient documentation

## 2012-03-01 DIAGNOSIS — R9431 Abnormal electrocardiogram [ECG] [EKG]: Secondary | ICD-10-CM

## 2012-03-01 DIAGNOSIS — R002 Palpitations: Secondary | ICD-10-CM | POA: Insufficient documentation

## 2012-03-01 DIAGNOSIS — R5381 Other malaise: Secondary | ICD-10-CM | POA: Insufficient documentation

## 2012-03-01 MED ORDER — REGADENOSON 0.4 MG/5ML IV SOLN
0.4000 mg | Freq: Once | INTRAVENOUS | Status: AC
  Administered 2012-03-01: 0.4 mg via INTRAVENOUS

## 2012-03-01 MED ORDER — AMINOPHYLLINE 25 MG/ML IV SOLN
75.0000 mg | Freq: Once | INTRAVENOUS | Status: AC
  Administered 2012-03-01: 75 mg via INTRAVENOUS

## 2012-03-01 MED ORDER — TECHNETIUM TC 99M TETROFOSMIN IV KIT
33.0000 | PACK | Freq: Once | INTRAVENOUS | Status: AC | PRN
  Administered 2012-03-01: 33 via INTRAVENOUS

## 2012-03-01 MED ORDER — TECHNETIUM TC 99M TETROFOSMIN IV KIT
11.0000 | PACK | Freq: Once | INTRAVENOUS | Status: AC | PRN
  Administered 2012-03-01: 11 via INTRAVENOUS

## 2012-03-01 NOTE — Progress Notes (Signed)
[facility] SITE 3 NUCLEAR MED [address] [place] [address] [phone]  Cardiology Nuclear Med Study  Kaitlyn Adams is a 61 y.o. female     MRN : [id number]     DOB: December 14, 1950  Procedure Date: 03/01/2012  Nuclear Med Background Indication for Stress Test:  Evaluation for Ischemia and Abnormal EKG History:  Asthma and AFIB, ABN EKG: NON SPECIFIC ST, T, changes, 2011, MPS: NL EF: 86% Fibromyalgia, 02/27/12 ECHO: EF: 65-70% Cardiac Risk Factors: Family History - CAD, Hypertension and Lipids  Symptoms:  Chest Pain, Fatigue, Palpitations and SOB   Nuclear Pre-Procedure Caffeine/Decaff Intake:  None NPO After: 7:30pm   Lungs:  clear O2 Sat: 98% on room air. IV 0.9% NS with Angio Cath:  22g  IV Site: R Antecubital  IV Started by:  Bonnita Levan, RN  Chest Size (in):  36 Cup Size: B  Height: 5\' 4"  (1.626 m)  Weight:  120 lb (54.432 kg)  BMI:  Body mass index is 20.60 kg/(m^2). Tech Comments:  Toprol held x 24 hrs   This patient had symptoms after her 2nd set of pictures in recovery. She was then reversed with IV Aminophylline 75 mg and a 10 cc NS flush. The patient stated it took  Away all her symptoms.      Nuclear Med Study 1 or 2 day study: 1 day  Stress Test Type:  Treadmill/Lexiscan  Reading MD: Dietrich Pates, MD  Order Authorizing Provider:  Peter Swazil[place], MD  Resting Radionuclide: Technetium 77m Tetrofosmin  Resting Radionuclide Dose: 10.1 mCi   Stress Radionuclide:  Technetium 7m Tetrofosmin  Stress Radionuclide Dose: 31.4 mCi           Stress Protocol Rest HR: 77 Stress HR: 100  Rest BP: 112/74 Stress BP: 126/78  Exercise Time (min): n/a METS: n/a   Predicted Max HR: 158 bpm % Max HR: 63.29 bpm Rate Pressure Product: 21308   Dose of Adenosine (mg):  n/a Dose of Lexiscan: 0.4 mg  Dose of Atropine (mg): n/a Dose of Dobutamine: n/a mcg/kg/min (at max HR)  Stress Test Technologist: Milana Na, EMT-P  Nuclear Technologist:  Domenic Polite,  CNMT     Rest Procedure:  Myocardial perfusion imaging was performed at rest 45 minutes following the intravenous administration of Technetium 25m Tetrofosmin. Rest ECG: NSR>  ST depression in the inferior and lateral leads.  Stress Procedure:  The patient received IV Lexiscan 0.4 mg over 15-seconds with concurrent low level exercise and then Technetium 22m Tetrofosmin was injected at 30-seconds while the patient continued walking one more minute. There were non specific changes, + sob, cp, and a weird feeling with Lexiscan. Quantitative spect images were obtained after a 45-minute delay. Stress ECG: Nondiagnostic due to baseline changes.  QPS Raw Data Images:  Images were motion corrected.  Soft tissue (diaphragm, bowel activity, breast) surround heart. Stress Images:  Normal homogeneous uptake in all areas of the myocardium. Rest Images:  Normal homogeneous uptake in all areas of the myocardium. Subtraction (SDS):  No evidence of ischemia. Transient Ischemic Dilatation (Normal <1.22):  1.12 Lung/Heart Ratio (Normal <0.45):  0.22  Quantitative Gated Spect Images QGS EDV:  51 ml QGS ESV:  7 ml  Impression Exercise Capacity:  Lexiscan with low level exercise. BP Response:  Normal blood pressure response. Clinical Symptoms:  Mild chest pain/dyspnea. ECG Impression:  Nondiagnostic due to baseline changes. Comparison with Prior Nuclear Study: No change from previous report to 2011.  Overall Impression:  Normal stress nuclear study.  LV Ejection Fraction: 86%.  LV Wall Motion:  NL LV Function; NL Wall Motion Dietrich Pates

## 2012-03-07 ENCOUNTER — Telehealth: Payer: Self-pay | Admitting: Cardiology

## 2012-03-07 NOTE — Telephone Encounter (Signed)
F/U  Patient returning call back to [place].

## 2012-03-07 NOTE — Telephone Encounter (Signed)
Spoke to patient regarding test results.  Pt notified.  Vista Mink, CMA

## 2012-07-10 ENCOUNTER — Ambulatory Visit (INDEPENDENT_AMBULATORY_CARE_PROVIDER_SITE_OTHER): Payer: BC Managed Care – PPO | Admitting: Emergency Medicine

## 2012-07-10 ENCOUNTER — Ambulatory Visit: Payer: BC Managed Care – PPO

## 2012-07-10 VITALS — BP 112/78 | Ht 64.0 in | Wt 125.0 lb

## 2012-07-10 DIAGNOSIS — S50319A Abrasion of unspecified elbow, initial encounter: Secondary | ICD-10-CM

## 2012-07-10 DIAGNOSIS — Z23 Encounter for immunization: Secondary | ICD-10-CM

## 2012-07-10 DIAGNOSIS — M25539 Pain in unspecified wrist: Secondary | ICD-10-CM

## 2012-07-10 DIAGNOSIS — M25559 Pain in unspecified hip: Secondary | ICD-10-CM

## 2012-07-10 DIAGNOSIS — M25529 Pain in unspecified elbow: Secondary | ICD-10-CM

## 2012-07-10 DIAGNOSIS — M79609 Pain in unspecified limb: Secondary | ICD-10-CM

## 2012-07-10 MED ORDER — NAPROXEN SODIUM 550 MG PO TABS: 550.0000 mg | ORAL_TABLET | Freq: Two times a day (BID) | ORAL | Status: DC

## 2012-07-10 NOTE — Progress Notes (Signed)
[facility] [address], [place] [address] [phone]  Date:  07/10/2012   Name:  Kaitlyn Adams   DOB:  Aug 28, 1950   MRN:  [id number]  PCP:  Herb Grays, MD    Chief Complaint: Fall, cut to right elbow, Hip Pain, Wrist Pain and right shoulder pain   History of Present Illness:  Kaitlyn Adams is a 62 y.o. very pleasant female patient who presents with the following:  Larey Seat out of the passenger side of a truck today and landed on her right side.  Has pain and an abrasion in the right elbow, pain in right hip and in left wrist.  Denies LOC or neuro complaints or symptoms.  Denies other injuries.  Is able to bear weight and walk with little difficulty.    Patient Active Problem List  Diagnosis  . HYPERTENSION  . ALLERGIC RHINITIS  . VOCAL CORD DISORDER  . OSTEOARTHRITIS  . FIBROMYALGIA  . Cough  . GERD, SEVERE  . Paroxysmal atrial fibrillation  . Renal insufficiency  . Myalgia  . Hypothyroidism  . Glaucoma  . Orthostatic hypotension  . Bronchitis  . Murmur  . Abnormal EKG    Past Medical History  Diagnosis Date  . Bronchitis     recurrent  . Paroxysmal atrial fibrillation   . Renal insufficiency   . Hypothyroidism   . Glaucoma(365)   . Hypotension     orthostatic  . Fibromyalgia   . Osteoarthritis   . Hypertension   . Vasomotor rhinitis     exacerbates VCD  . Asthma     - normal spirometry 2009 FEV1  >90% predicted.  - methacoline challenge neg 11/09  . Vocal cord dysfunction     proven on FOB 9/09  . Chronic cough     - sinus CT 03-23-10 > neg.  - allergy profile 03-23-10 > nl, IgE 14.  - flutter valve rx 03-23-10  . GERD (gastroesophageal reflux disease)     exacerbating VCD  . Hyperthyroidism     with hot nodule.  - total thyroidectomy 11-05-08 benign  . Abnormal EKG   . Anemia   . Cataract   . Heart murmur   . Fibromyalgia     Past Surgical History  Procedure Date  . Abdominal hysterectomy 1980  . Scar tissue  removal 1982  . Thyroidectomy 11-05-08  . Basal cell carcinoma excision 11/08  . Eye surgery   . Abdominal hysterectomy   . Thyroid surgery 2010    History  Substance Use Topics  . Smoking status: Never Smoker   . Smokeless tobacco: Never Used  . Alcohol Use: 0.6 oz/week    1 Glasses of wine per week     Comment: occasional wine or beer    Family History  Problem Relation Age of Onset  . Asthma Mother   . Heart disease Mother   . Asthma Grandchild   . Emphysema Father   . Heart disease Father     Allergies  Allergen Reactions  . Codeine     REACTION: itching  . Erythromycin     REACTION: nausea and vomiting  . Sulfonamide Derivatives     REACTION: nausea and vomiting  . Tramadol Hcl     REACTION: itching    Medication list has been reviewed and updated.  Current Outpatient Prescriptions on File Prior to Visit  Medication Sig Dispense Refill  . Acetaminophen (TYLENOL ARTHRITIS PAIN PO) Take by mouth as needed.        Marland Kitchen  ALPRAZolam (XANAX) 0.25 MG tablet Take 0.25 mg by mouth as needed.        Marland Kitchen amitriptyline (ELAVIL) 50 MG tablet Take 50 mg by mouth at bedtime.        . Bimatoprost (LUMIGAN) 0.01 % SOLN Apply to eye at bedtime.       . calcium-vitamin D (OSCAL) 250-125 MG-UNIT per tablet Take 1 tablet by mouth daily.      . cycloSPORINE (RESTASIS) 0.05 % ophthalmic emulsion Apply 1 drop to eye 2 (two) times daily.      Marland Kitchen dexlansoprazole (DEXILANT) 60 MG capsule Take 1 capsule (60 mg total) by mouth daily.  30 capsule  6  . digoxin (LANOXIN) 0.25 MG tablet Take 250 mcg by mouth daily.        . fenofibrate (TRIGLIDE) 50 MG tablet Take 50 mg by mouth daily.      . ferrous sulfate 325 (65 FE) MG tablet Take 325 mg by mouth daily with breakfast.      . furosemide (LASIX) 20 MG tablet Take 20 mg by mouth daily.        Marland Kitchen ibuprofen (ADVIL,MOTRIN) 800 MG tablet As needed      . levothyroxine (SYNTHROID, LEVOTHROID) 75 MCG tablet Take 75 mcg by mouth daily.        .  metoprolol succinate (TOPROL-XL) 25 MG 24 hr tablet Take 1 tablet (25 mg total) by mouth daily.  90 tablet  3  . ranitidine (ZANTAC) 300 MG tablet Take 300 mg by mouth at bedtime.          Review of Systems:  As per HPI, otherwise negative.    Physical Examination: Filed Vitals:   07/10/12 1528  BP: 112/78   Filed Vitals:   07/10/12 1528  Height: 5\' 4"  (1.626 m)  Weight: 125 lb (56.7 kg)   Body mass index is 21.46 kg/(m^2). Ideal Body Weight: Weight in (lb) to have BMI = 25: 145.3    GEN: WDWN, NAD, Non-toxic, Alert & Oriented x 3 HEENT: Atraumatic, Normocephalic.  Ears and Nose: No external deformity. EXTR: No clubbing/cyanosis/edema NEURO: Normal gait.  PSYCH: Normally interactive. Conversant. Not depressed or anxious appearing.  Calm demeanor.  EXT:  Abrasion right elbow.  Guards full PROM.  Left wrist tender with little limitation of active ROM.  Tender right hip with no crepitus  Assessment and Plan: Multiple contusions Abrasion elbow Ice, elevation Rest Anaprox ds Follow up as needed  [name], MD  [facility] reading (PRIMARY) by  Dr. Dareen Piano.  Negative hip.  [facility] reading (PRIMARY) by  Dr. Dareen Piano.  Negative wrist  [facility] reading (PRIMARY) by  Dr. Dareen Piano.  Negative elbow.

## 2012-07-18 NOTE — Progress Notes (Signed)
Reviewed and agree.

## 2012-10-24 ENCOUNTER — Encounter: Payer: Self-pay | Admitting: Cardiology

## 2012-10-24 ENCOUNTER — Ambulatory Visit (INDEPENDENT_AMBULATORY_CARE_PROVIDER_SITE_OTHER): Payer: BC Managed Care – PPO | Admitting: Cardiology

## 2012-10-24 VITALS — BP 122/68 | HR 80 | Ht 64.0 in | Wt 128.4 lb

## 2012-10-24 DIAGNOSIS — I10 Essential (primary) hypertension: Secondary | ICD-10-CM

## 2012-10-24 MED ORDER — METOPROLOL SUCCINATE ER 25 MG PO TB24: 25.0000 mg | ORAL_TABLET | Freq: Every day | ORAL | Status: DC

## 2012-10-24 NOTE — Patient Instructions (Signed)
Continue your current therapy  I will see you again in 6 months.   

## 2012-10-24 NOTE — Progress Notes (Signed)
Kaitlyn Adams Date of Birth: Mar 11, 1951   History of Present Illness:  Kaitlyn Adams is seen for followup today. She reports she is doing very well. She will have an occasional sensation of irregular heartbeat or rapid heartbeat. This occurs 3 or 4 times a week and it resolves with relaxation. She's had no sustained episodes. She denies any dizziness, syncope, chest pain, or shortness of breath. Overall she feels her health is good.  Current Outpatient Prescriptions on File Prior to Visit  Medication Sig Dispense Refill  . Acetaminophen (TYLENOL ARTHRITIS PAIN PO) Take by mouth as needed.        . ALPRAZolam (XANAX) 0.25 MG tablet Take 0.25 mg by mouth as needed.        Marland Kitchen amitriptyline (ELAVIL) 50 MG tablet Take 50 mg by mouth at bedtime.        . Ascorbic Acid (VITAMIN C) 100 MG tablet Take 100 mg by mouth daily.      . Bimatoprost (LUMIGAN) 0.01 % SOLN Apply to eye at bedtime.       . calcium-vitamin D (OSCAL) 250-125 MG-UNIT per tablet Take 1 tablet by mouth daily.      . cycloSPORINE (RESTASIS) 0.05 % ophthalmic emulsion Apply 1 drop to eye 2 (two) times daily.      Marland Kitchen dexlansoprazole (DEXILANT) 60 MG capsule Take 1 capsule (60 mg total) by mouth daily.  30 capsule  6  . digoxin (LANOXIN) 0.25 MG tablet Take 250 mcg by mouth daily.        . fenofibrate (TRIGLIDE) 50 MG tablet Take 50 mg by mouth daily.      . furosemide (LASIX) 20 MG tablet Take 20 mg by mouth daily.        Marland Kitchen ibuprofen (ADVIL,MOTRIN) 800 MG tablet As needed      . levothyroxine (SYNTHROID, LEVOTHROID) 75 MCG tablet Take 75 mcg by mouth daily.        . ranitidine (ZANTAC) 300 MG tablet Take 300 mg by mouth at bedtime.         No current facility-administered medications on file prior to visit.    Allergies  Allergen Reactions  . Codeine     REACTION: itching  . Erythromycin     REACTION: nausea and vomiting  . Sulfonamide Derivatives     REACTION: nausea and vomiting  . Tramadol Hcl     REACTION: itching     Past Medical History  Diagnosis Date  . Bronchitis     recurrent  . Paroxysmal atrial fibrillation   . Renal insufficiency   . Hypothyroidism   . Glaucoma(365)   . Hypotension     orthostatic  . Fibromyalgia   . Osteoarthritis   . Hypertension   . Vasomotor rhinitis     exacerbates VCD  . Asthma     - normal spirometry 2009 FEV1  >90% predicted.  - methacoline challenge neg 11/09  . Vocal cord dysfunction     proven on FOB 9/09  . Chronic cough     - sinus CT 03-23-10 > neg.  - allergy profile 03-23-10 > nl, IgE 14.  - flutter valve rx 03-23-10  . GERD (gastroesophageal reflux disease)     exacerbating VCD  . Hyperthyroidism     with hot nodule.  - total thyroidectomy 11-05-08 benign  . Abnormal EKG   . Anemia   . Cataract   . Heart murmur   . Fibromyalgia     Past Surgical History  Procedure Laterality Date  . Abdominal hysterectomy  1980  . Scar tissue removal  1982  . Thyroidectomy  11-05-08  . Basal cell carcinoma excision  11/08  . Eye surgery    . Abdominal hysterectomy    . Thyroid surgery  2010    History  Smoking status  . Never Smoker   Smokeless tobacco  . Never Used    History  Alcohol Use  . 0.6 oz/week  . 1 Glasses of wine per week    Comment: occasional wine or beer    Family History  Problem Relation Age of Onset  . Asthma Mother   . Heart disease Mother   . Asthma Grandchild   . Emphysema Father   . Heart disease Father     Review of Systems: As noted in history of present illness. All other systems were reviewed and are negative.  Physical Exam: BP 122/68  Pulse 80  Ht 5\' 4"  (1.626 m)  Wt 128 lb 6.4 oz (58.242 kg)  BMI 22.03 kg/m2  SpO2 99% She is a pleasant female in no acute distress. HEENT exam is unremarkable. She has no JVD or bruits. Lungs are clear. Cardiac exam reveals a regular rate and rhythm without gallop, murmur, or click. Femoral and pedal pulses are palpable. She has no significant edema. Skin reveals somewhat  diffuse erythema and thickening. Neurologic exam is nonfocal.  Transthoracic Echocardiography  Patient: Kaitlyn, Adams MR #: [id number] Study Date: 02/27/2012 Gender: F Age: 62 Height: 162.6cm Weight: 55.8kg BSA: 1.11m^2 Pt. Status: Room:  REFERRING [place], Peter SONOGRAPHER Domenica Fail Alona Bene ATTENDING Cassell Clement PERFORMING [facility], [facility] ORDERING Tyrone Sage, Lori Jacobo Forest, Lawson Fiscal cc: Herb Grays  ------------------------------------------------------------ LV EF: 65% - 70%  ------------------------------------------------------------ Indications: Murmur 785.2.  ------------------------------------------------------------ History: PMH: Acquired from the patient and from the patient's chart. Chest pain. Fatigue and murmur. Atrial fibrillation. Risk factors: Family history of coronary artery disease. Hypertension.  ------------------------------------------------------------ Study Conclusions  - Left ventricle: The cavity size was normal. Systolic function was vigorous. The estimated ejection fraction was in the range of 65% to 70%. Wall motion was normal; there were no regional wall motion abnormalities. Doppler parameters are consistent with abnormal left ventricular relaxation (grade 1 diastolic dysfunction). - Atrial septum: No defect or patent foramen ovale was identified.  ------------------------------------------------------------ Labs, prior tests, procedures, and surgery: ECG. Abnormal. Transthoracic echocardiography. M-mode, complete 2D, spectral Doppler, and color Doppler. Height: Height: 162.6cm. Height: 64in. Weight: Weight: 55.8kg. Weight: 122.7lb. Body mass index: BMI: 21.1kg/m^2. Body surface area: BSA: 1.34m^2. Blood pressure: 120/70. Patient status: Outpatient. Location: [place] Site [facility]  ------------------------------------------------------------  ------------------------------------------------------------ Left  ventricle: The cavity size was normal. Systolic function was vigorous. The estimated ejection fraction was in the range of 65% to 70%. Wall motion was normal; there were no regional wall motion abnormalities. Doppler parameters are consistent with abnormal left ventricular relaxation (grade 1 diastolic dysfunction).  ------------------------------------------------------------ Aortic valve: Trileaflet. Doppler: No regurgitation.  ------------------------------------------------------------ Aorta: The aorta was normal, not dilated, and non-diseased.  ------------------------------------------------------------ Mitral valve: Structurally normal valve. Doppler: Trivial regurgitation.  ------------------------------------------------------------ Left atrium: The atrium was normal in size.  ------------------------------------------------------------ Atrial septum: No defect or patent foramen ovale was identified.  ------------------------------------------------------------ Right ventricle: The cavity size was normal. Wall thickness was normal. Systolic function was normal.  ------------------------------------------------------------ Pulmonic valve: Structurally normal valve. Doppler: No significant regurgitation.  ------------------------------------------------------------ Tricuspid valve: Structurally normal valve. Doppler: Trivial regurgitation.  ------------------------------------------------------------ Pulmonary artery: The main pulmonary artery was normal-sized.  ------------------------------------------------------------ Right  atrium: The atrium was normal in size.  ------------------------------------------------------------ Pericardium: There was no pericardial effusion.  ------------------------------------------------------------ Systemic veins: Inferior vena cava: The vessel was normal in size; the respirophasic diameter changes were in the normal range  (= 50%); findings are consistent with normal central venous pressure.  ------------------------------------------------------------ Post procedure conclusions Ascending Aorta:  - The aorta was normal, not dilated, and non-diseased.  ------------------------------------------------------------  2D measurements Normal Doppler measurements Norma Left ventricle l LVID ED, 39.2 mm 43-52 Main pulmonary artery chord, Pressure, 19 mm Hg =30 PLAX S LVID ES, 20.1 mm 23-38 Left ventricle chord, Ea, lat 10. cm/s ----- PLAX ann, tiss 3 FS, chord, 49 % >29 DP PLAX E/Ea, lat 6.5 ----- LVPW, ED 10 mm ------ ann, tiss 1 IVS/LVPW 0.5 <1.3 DP ratio, ED Ea, med 6.3 cm/s ----- Ventricular septum ann, tiss 6 IVS, ED 4.98 mm ------ DP LVOT E/Ea, med 10. ----- Diam, S 18 mm ------ ann, tiss 55 Area 2.54 cm^2 ------ DP Diam 18 mm ------ LVOT Aorta Peak vel, 117 cm/s ----- Root diam, 26 mm ------ S ED VTI, S 21. cm ----- Left atrium 9 AP dim 28 mm ------ Peak 5 mm Hg ----- AP dim 1.76 cm/m^2 <2.2 gradient, index S HR 86 bpm ----- Stroke vol 55. ml ----- 7 Cardiac 4.8 L/min ----- output Cardiac 3 L/(min-m ----- index ^2) Stroke 35 ml/m^2 ----- index Mitral valve Peak E vel 67. cm/s ----- 1 Peak A vel 90. cm/s ----- 3 Decelerati 151 ms 150-2 on time 30 Peak E/A 0.7 ----- ratio Tricuspid valve Regurg 184 cm/s ----- peak vel Peak RV-RA 14 mm Hg ----- gradient, S Systemic veins Estimated 5 mm Hg ----- CVP Right ventricle Pressure, 19 mm Hg <30 S Sa vel, 13. cm/s ----- lat ann, 2 tiss DP  ------------------------------------------------------------ Prepared and Electronically Authenticated by  Cassell Clement 2013-07-08T16:15:[id number]  LABORATORY DATA: ECG demonstrates sinus bradycardia with a rate of 59 beats per minute. She has nonspecific ST changes consistent with dig effect. Chemistry panel from December 2013 was normal. Hemoglobin 11.3. Platelets 372,000. White  count was normal. Total cholesterol 220, HDL 32, triglycerides 145, LDL 159.  Cardiology Nuclear Med Study  Amoree Newlon General is a 62 y.o. female MRN : [id number] DOB: 12-04-1950  Procedure Date: 03/01/2012  Nuclear Med Background  Indication for Stress Test: Evaluation for Ischemia and Abnormal EKG  History: Asthma and AFIB, ABN EKG: NON SPECIFIC ST, T, changes, 2011, MPS: NL EF: 86% Fibromyalgia, 02/27/12 ECHO: EF: 65-70%  Cardiac Risk Factors: Family History - CAD, Hypertension and Lipids  Symptoms: Chest Pain, Fatigue, Palpitations and SOB  Nuclear Pre-Procedure  Caffeine/Decaff Intake: None  NPO After: 7:30pm   Lungs: clear  O2 Sat: 98% on room air.  IV 0.9% NS with Angio Cath: 22g   IV Site: R Antecubital  IV Started by: Bonnita Levan, RN   Chest Size (in): 36  Cup Size: B   Height: 5\' 4"  (1.626 m)  Weight: 120 lb (54.432 kg)   BMI: Body mass index is 20.60 kg/(m^2).  Tech Comments: Toprol held x 24 hrs This patient had symptoms after her 2nd set of pictures in recovery. She was then reversed with IV Aminophylline 75 mg and a 10 cc NS flush. The patient stated it took Away all her symptoms.   Nuclear Med Study  1 or 2 day study: 1 day  Stress Test Type: Treadmill/Lexiscan   Reading MD: Dietrich Pates, MD  Order Authorizing Provider: Peter [place], MD  Resting Radionuclide: Technetium 61m Tetrofosmin  Resting Radionuclide Dose: 10.1 mCi   Stress Radionuclide: Technetium 63m Tetrofosmin  Stress Radionuclide Dose: 31.4 mCi   Stress Protocol  Rest HR: 77  Stress HR: 100   Rest BP: 112/74  Stress BP: 126/78   Exercise Time (min): n/a  METS: n/a   Predicted Max HR: 158 bpm  % Max HR: 63.29 bpm  Rate Pressure Product: 82956  Dose of Adenosine (mg): n/a  Dose of Lexiscan: 0.4 mg   Dose of Atropine (mg): n/a  Dose of Dobutamine: n/a mcg/kg/min (at max HR)   Stress Test Technologist: Milana Na, EMT-P  Nuclear Technologist: Domenic Polite, CNMT   Rest Procedure: Myocardial perfusion  imaging was performed at rest 45 minutes following the intravenous administration of Technetium 15m Tetrofosmin.  Rest ECG: NSR> ST depression in the inferior and lateral leads.  Stress Procedure: The patient received IV Lexiscan 0.4 mg over 15-seconds with concurrent low level exercise and then Technetium 22m Tetrofosmin was injected at 30-seconds while the patient continued walking one more minute. There were non specific changes, + sob, cp, and a weird feeling with Lexiscan. Quantitative spect images were obtained after a 45-minute delay.  Stress ECG: Nondiagnostic due to baseline changes.  QPS  Raw Data Images: Images were motion corrected. Soft tissue (diaphragm, bowel activity, breast) surround heart.  Stress Images: Normal homogeneous uptake in all areas of the myocardium.  Rest Images: Normal homogeneous uptake in all areas of the myocardium.  Subtraction (SDS): No evidence of ischemia.  Transient Ischemic Dilatation (Normal <1.22): 1.12  Lung/Heart Ratio (Normal <0.45): 0.22  Quantitative Gated Spect Images  QGS EDV: 51 ml  QGS ESV: 7 ml  Impression  Exercise Capacity: Lexiscan with low level exercise.  BP Response: Normal blood pressure response.  Clinical Symptoms: Mild chest pain/dyspnea.  ECG Impression: Nondiagnostic due to baseline changes.  Comparison with Prior Nuclear Study: No change from previous report to 2011.  Overall Impression: Normal stress nuclear study.  LV Ejection Fraction: 86%. LV Wall Motion: NL LV Function; NL Wall Motion  Dietrich Pates     Assessment / Plan: 1. History of paroxysmal atrial fibrillation. Well controlled on metoprolol and digoxin. I would continue her current therapy. I recommended a dig level with her next lab draw with Dr. Collins [place]. Echocardiogram and Myoview study this past year was unremarkable.  2. Hypertension, well controlled.

## 2012-11-01 ENCOUNTER — Telehealth: Payer: Self-pay

## 2012-11-01 NOTE — Telephone Encounter (Signed)
Dr.Spear's office called they will add digoxin level to lab work scheduled for 11/19/12 and will fax copy of results to Dr.Jordan.

## 2012-12-06 ENCOUNTER — Telehealth: Payer: Self-pay

## 2012-12-06 MED ORDER — DIGOXIN 125 MCG PO TABS: 0.1250 mg | ORAL_TABLET | Freq: Every day | ORAL | Status: DC

## 2012-12-06 NOTE — Telephone Encounter (Signed)
Spoke to patient was told received lab work from [facility].Digoxin level elevated 2.5.Dr.Jordan advised to decrease lanoxin 0.125 mg daily.Prescription sent to pharmacy.

## 2013-06-27 ENCOUNTER — Other Ambulatory Visit: Payer: Self-pay

## 2013-11-11 ENCOUNTER — Other Ambulatory Visit: Payer: Self-pay | Admitting: *Deleted

## 2013-11-11 MED ORDER — METOPROLOL SUCCINATE ER 25 MG PO TB24: 25.0000 mg | ORAL_TABLET | Freq: Every day | ORAL | Status: DC

## 2014-09-30 IMAGING — CR DG ELBOW COMPLETE 3+V*R*
2 series · 2 of 2 positions shown · non-contrast
Comparison: None.

CLINICAL DATA: Post fall

RIGHT ELBOW - COMPLETE 3+ VIEW

[AP]
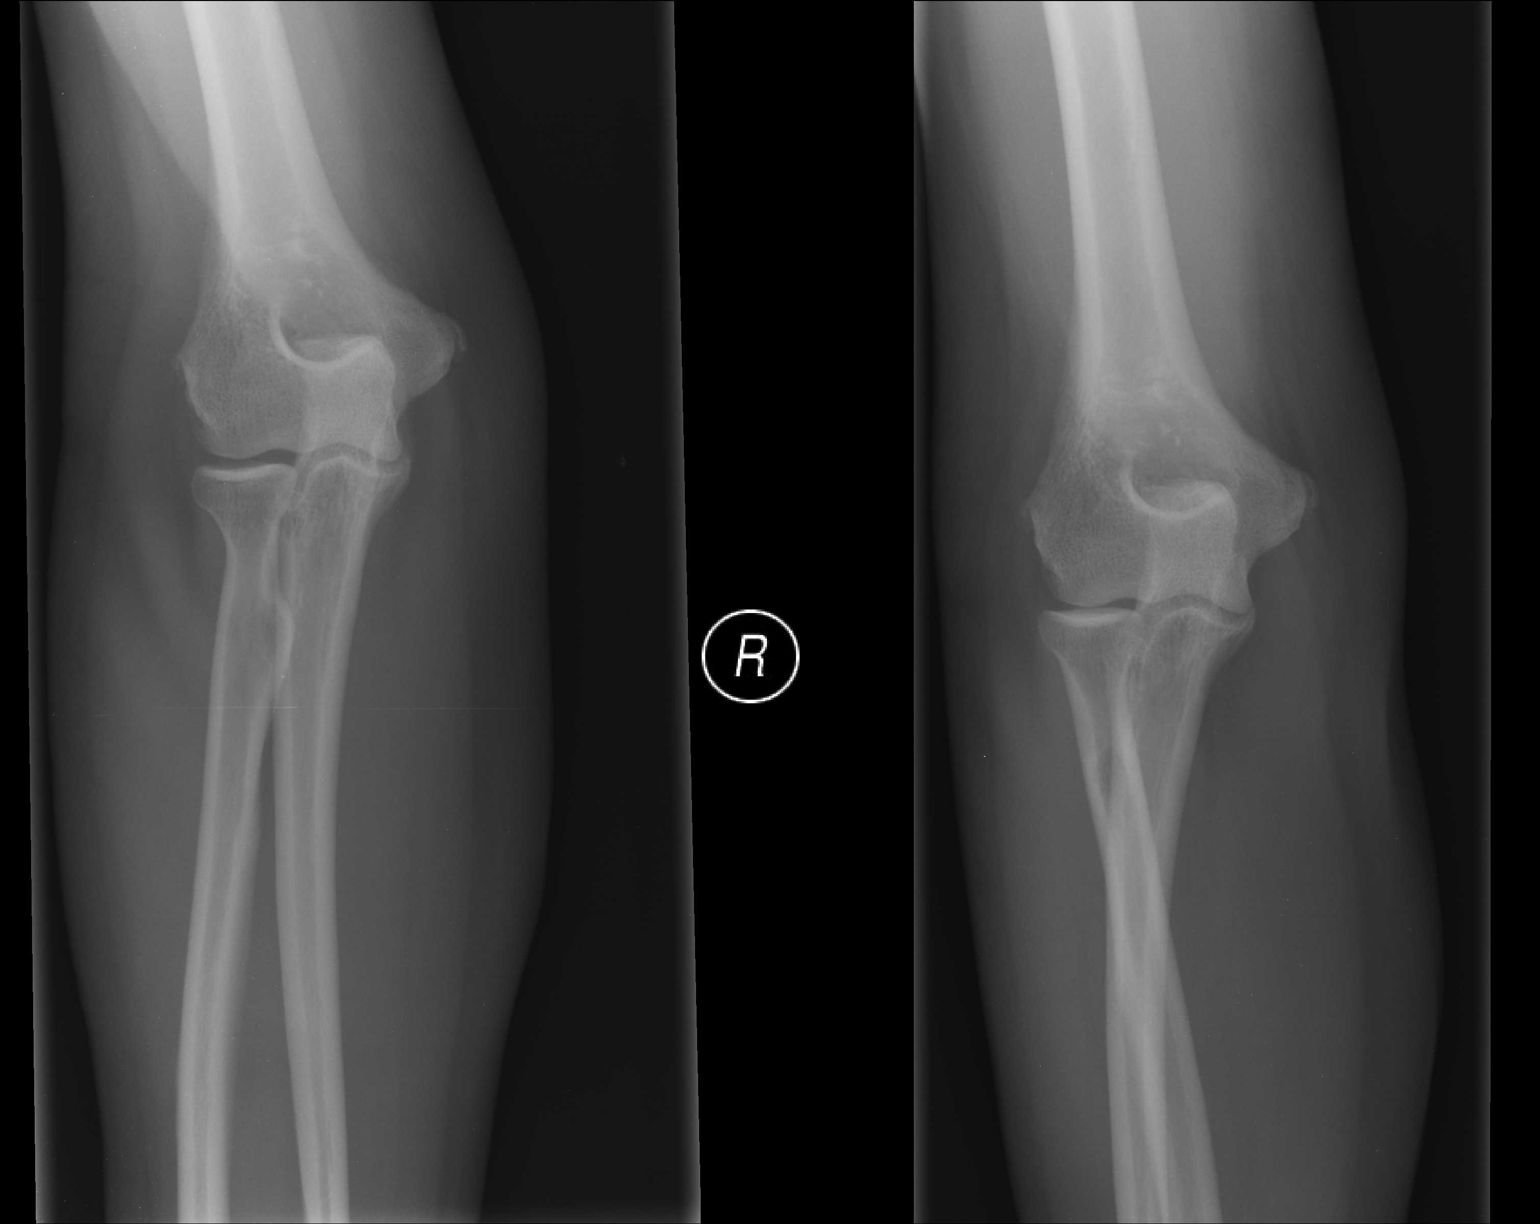

[ap obl ext rot]
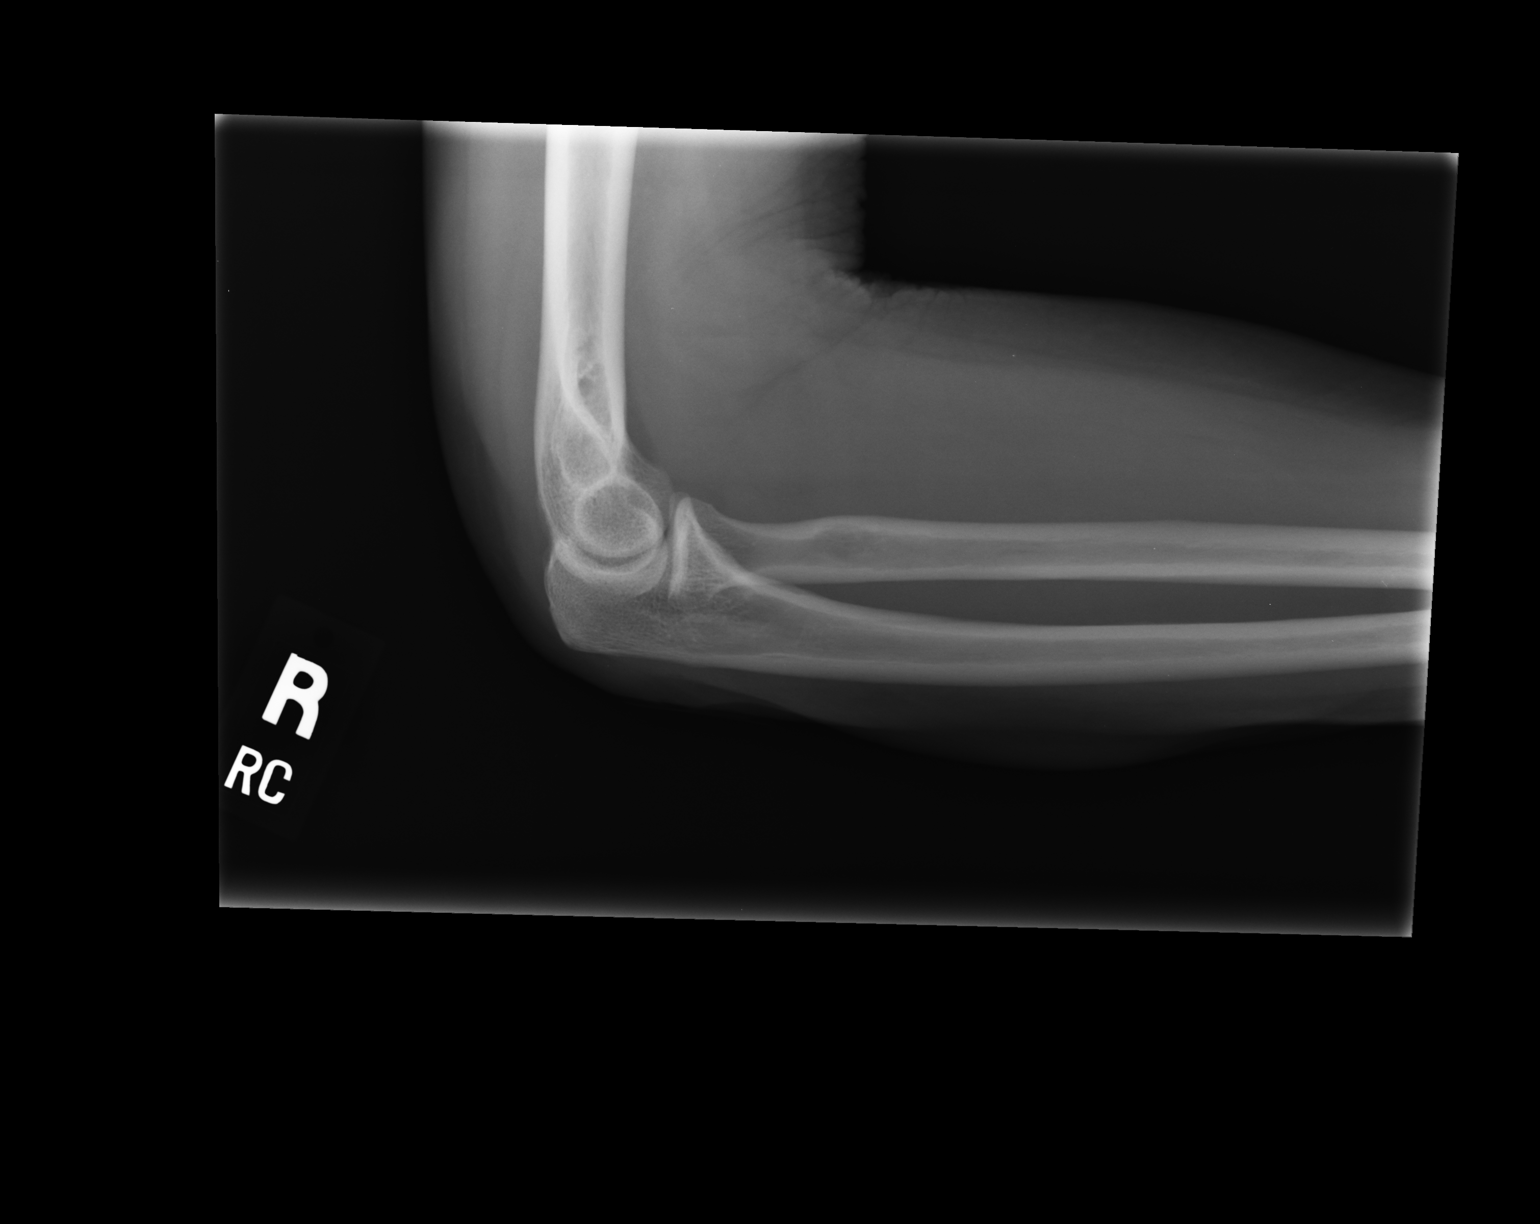

[2 of 2 positions shown; findings below may reference images not displayed]

FINDINGS: No fracture or elbow joint effusion.  Minimal
enthesopathic changes at both the medial and lateral epicondyles.
Joint bases are preserved.  Regional soft tissues are normal.  No
radiopaque foreign body.
IMPRESSION: No fracture or elbow joint effusion.

## 2015-05-14 ENCOUNTER — Inpatient Hospital Stay (HOSPITAL_COMMUNITY)
Admission: EM | Admit: 2015-05-14 | Discharge: 2015-05-15 | DRG: 287 | Disposition: A | Discharge status: Home / Self Care | Payer: BLUE CROSS/BLUE SHIELD | Attending: Cardiology | Admitting: Cardiology

## 2015-05-14 ENCOUNTER — Encounter (HOSPITAL_COMMUNITY): Payer: Self-pay

## 2015-05-14 ENCOUNTER — Telehealth: Payer: Self-pay | Admitting: Cardiology

## 2015-05-14 ENCOUNTER — Emergency Department (HOSPITAL_COMMUNITY): Payer: BLUE CROSS/BLUE SHIELD

## 2015-05-14 DIAGNOSIS — Z85828 Personal history of other malignant neoplasm of skin: Secondary | ICD-10-CM

## 2015-05-14 DIAGNOSIS — D72829 Elevated white blood cell count, unspecified: Secondary | ICD-10-CM | POA: Diagnosis present

## 2015-05-14 DIAGNOSIS — R079 Chest pain, unspecified: Secondary | ICD-10-CM | POA: Diagnosis not present

## 2015-05-14 DIAGNOSIS — M797 Fibromyalgia: Secondary | ICD-10-CM | POA: Diagnosis present

## 2015-05-14 DIAGNOSIS — Z885 Allergy status to narcotic agent status: Secondary | ICD-10-CM | POA: Diagnosis not present

## 2015-05-14 DIAGNOSIS — D649 Anemia, unspecified: Secondary | ICD-10-CM | POA: Diagnosis present

## 2015-05-14 DIAGNOSIS — Z882 Allergy status to sulfonamides status: Secondary | ICD-10-CM | POA: Diagnosis not present

## 2015-05-14 DIAGNOSIS — Z90710 Acquired absence of both cervix and uterus: Secondary | ICD-10-CM

## 2015-05-14 DIAGNOSIS — E876 Hypokalemia: Secondary | ICD-10-CM | POA: Diagnosis not present

## 2015-05-14 DIAGNOSIS — I2510 Atherosclerotic heart disease of native coronary artery without angina pectoris: Secondary | ICD-10-CM | POA: Diagnosis present

## 2015-05-14 DIAGNOSIS — I25110 Atherosclerotic heart disease of native coronary artery with unstable angina pectoris: Secondary | ICD-10-CM | POA: Diagnosis not present

## 2015-05-14 DIAGNOSIS — E785 Hyperlipidemia, unspecified: Secondary | ICD-10-CM | POA: Diagnosis present

## 2015-05-14 DIAGNOSIS — I200 Unstable angina: Secondary | ICD-10-CM

## 2015-05-14 DIAGNOSIS — H269 Unspecified cataract: Secondary | ICD-10-CM | POA: Diagnosis present

## 2015-05-14 DIAGNOSIS — M1990 Unspecified osteoarthritis, unspecified site: Secondary | ICD-10-CM | POA: Diagnosis present

## 2015-05-14 DIAGNOSIS — R0609 Other forms of dyspnea: Secondary | ICD-10-CM | POA: Diagnosis present

## 2015-05-14 DIAGNOSIS — I480 Paroxysmal atrial fibrillation: Principal | ICD-10-CM | POA: Diagnosis present

## 2015-05-14 DIAGNOSIS — I10 Essential (primary) hypertension: Secondary | ICD-10-CM | POA: Diagnosis present

## 2015-05-14 DIAGNOSIS — Z79899 Other long term (current) drug therapy: Secondary | ICD-10-CM

## 2015-05-14 DIAGNOSIS — J45909 Unspecified asthma, uncomplicated: Secondary | ICD-10-CM | POA: Diagnosis present

## 2015-05-14 DIAGNOSIS — K219 Gastro-esophageal reflux disease without esophagitis: Secondary | ICD-10-CM | POA: Diagnosis present

## 2015-05-14 DIAGNOSIS — Z881 Allergy status to other antibiotic agents status: Secondary | ICD-10-CM | POA: Diagnosis not present

## 2015-05-14 DIAGNOSIS — E890 Postprocedural hypothyroidism: Secondary | ICD-10-CM | POA: Diagnosis present

## 2015-05-14 DIAGNOSIS — Z7901 Long term (current) use of anticoagulants: Secondary | ICD-10-CM | POA: Diagnosis not present

## 2015-05-14 DIAGNOSIS — Z8249 Family history of ischemic heart disease and other diseases of the circulatory system: Secondary | ICD-10-CM | POA: Diagnosis not present

## 2015-05-14 DIAGNOSIS — E039 Hypothyroidism, unspecified: Secondary | ICD-10-CM | POA: Diagnosis present

## 2015-05-14 LAB — HEPARIN LEVEL (UNFRACTIONATED): HEPARIN UNFRACTIONATED: 0.12 [IU]/mL — AB (ref 0.30–0.70)

## 2015-05-14 LAB — CBC
HCT: 44.5 % (ref 36.0–46.0)
Hemoglobin: 14.6 g/dL (ref 12.0–15.0)
MCH: 25.9 pg — ABNORMAL LOW (ref 26.0–34.0)
MCHC: 32.8 g/dL (ref 30.0–36.0)
MCV: 78.9 fL (ref 78.0–100.0)
PLATELETS: 377 10*3/uL (ref 150–400)
RBC: 5.64 MIL/uL — ABNORMAL HIGH (ref 3.87–5.11)
RDW: 14 % (ref 11.5–15.5)
WBC: 11.5 10*3/uL — AB (ref 4.0–10.5)

## 2015-05-14 LAB — BASIC METABOLIC PANEL
Anion gap: 12 (ref 5–15)
BUN: 10 mg/dL (ref 6–20)
CHLORIDE: 98 mmol/L — AB (ref 101–111)
CO2: 28 mmol/L (ref 22–32)
CREATININE: 0.98 mg/dL (ref 0.44–1.00)
Calcium: 9.5 mg/dL (ref 8.9–10.3)
GFR calc Af Amer: >60 mL/min (ref >60)
GFR calc non Af Amer: 59 mL/min — ABNORMAL LOW (ref >60)
Glucose, Bld: 102 mg/dL — ABNORMAL HIGH (ref 65–99)
Potassium: 2.5 mmol/L — CL (ref 3.5–5.1)
SODIUM: 138 mmol/L (ref 135–145)

## 2015-05-14 LAB — I-STAT TROPONIN, ED: Troponin i, poc: 0 ng/mL (ref 0.00–0.08)

## 2015-05-14 LAB — MAGNESIUM: Magnesium: 1.7 mg/dL (ref 1.7–2.4)

## 2015-05-14 LAB — TROPONIN I: Troponin I: <0.03 ng/mL (ref <0.031)

## 2015-05-14 MED ORDER — PANTOPRAZOLE SODIUM 40 MG PO TBEC
40.0000 mg | DELAYED_RELEASE_TABLET | Freq: Every day | ORAL | Status: DC
  Administered 2015-05-14 – 2015-05-15 (×2): 40 mg via ORAL
  Filled 2015-05-14 (×2): qty 1

## 2015-05-14 MED ORDER — ACETAMINOPHEN 325 MG PO TABS
650.0000 mg | ORAL_TABLET | ORAL | Status: DC | PRN
  Administered 2015-05-14 – 2015-05-15 (×2): 650 mg via ORAL
  Filled 2015-05-14 (×2): qty 2

## 2015-05-14 MED ORDER — ASPIRIN EC 81 MG PO TBEC
81.0000 mg | DELAYED_RELEASE_TABLET | Freq: Every day | ORAL | Status: DC
  Filled 2015-05-14: qty 1

## 2015-05-14 MED ORDER — NITROGLYCERIN 0.4 MG SL SUBL: 0.4000 mg | SUBLINGUAL_TABLET | SUBLINGUAL | Status: DC | PRN

## 2015-05-14 MED ORDER — ATORVASTATIN CALCIUM 80 MG PO TABS: 80.0000 mg | ORAL_TABLET | Freq: Every day | ORAL | Status: DC

## 2015-05-14 MED ORDER — ASPIRIN 81 MG PO CHEW
81.0000 mg | CHEWABLE_TABLET | ORAL | Status: AC
  Administered 2015-05-15: 81 mg via ORAL
  Filled 2015-05-14: qty 1

## 2015-05-14 MED ORDER — SODIUM CHLORIDE 0.9 % IV SOLN
INTRAVENOUS | Status: DC
  Administered 2015-05-15: 06:00:00 via INTRAVENOUS

## 2015-05-14 MED ORDER — LEVOTHYROXINE SODIUM 75 MCG PO TABS
75.0000 ug | ORAL_TABLET | Freq: Every day | ORAL | Status: DC
  Administered 2015-05-15: 75 ug via ORAL
  Filled 2015-05-14: qty 1

## 2015-05-14 MED ORDER — ASPIRIN 81 MG PO CHEW
324.0000 mg | CHEWABLE_TABLET | Freq: Once | ORAL | Status: AC
  Administered 2015-05-14: 324 mg via ORAL
  Filled 2015-05-14: qty 4

## 2015-05-14 MED ORDER — NITROGLYCERIN IN D5W 200-5 MCG/ML-% IV SOLN
10.0000 ug/min | INTRAVENOUS | Status: DC
  Administered 2015-05-14: 10 ug/min via INTRAVENOUS
  Filled 2015-05-14: qty 250

## 2015-05-14 MED ORDER — HEPARIN (PORCINE) IN NACL 100-0.45 UNIT/ML-% IJ SOLN
850.0000 [IU]/h | INTRAMUSCULAR | Status: DC
  Administered 2015-05-14: 850 [IU]/h via INTRAVENOUS

## 2015-05-14 MED ORDER — POTASSIUM CHLORIDE 10 MEQ/100ML IV SOLN
10.0000 meq | Freq: Once | INTRAVENOUS | Status: AC
  Administered 2015-05-14: 10 meq via INTRAVENOUS
  Filled 2015-05-14: qty 100

## 2015-05-14 MED ORDER — SODIUM CHLORIDE 0.9 % IJ SOLN
3.0000 mL | Freq: Two times a day (BID) | INTRAMUSCULAR | Status: DC
  Administered 2015-05-14 – 2015-05-15 (×2): 3 mL via INTRAVENOUS

## 2015-05-14 MED ORDER — POTASSIUM CHLORIDE CRYS ER 20 MEQ PO TBCR
40.0000 meq | EXTENDED_RELEASE_TABLET | Freq: Once | ORAL | Status: AC
  Administered 2015-05-14: 40 meq via ORAL
  Filled 2015-05-14: qty 2

## 2015-05-14 MED ORDER — METOPROLOL SUCCINATE ER 25 MG PO TB24
25.0000 mg | ORAL_TABLET | Freq: Every day | ORAL | Status: DC
  Administered 2015-05-15: 25 mg via ORAL
  Filled 2015-05-14: qty 1

## 2015-05-14 MED ORDER — ALPRAZOLAM 0.25 MG PO TABS
0.2500 mg | ORAL_TABLET | ORAL | Status: DC | PRN
  Administered 2015-05-14: 0.25 mg via ORAL
  Filled 2015-05-14: qty 1

## 2015-05-14 MED ORDER — ACETAMINOPHEN ER 650 MG PO TBCR: 650.0000 mg | EXTENDED_RELEASE_TABLET | ORAL | Status: DC | PRN

## 2015-05-14 MED ORDER — SODIUM CHLORIDE 0.9 % IV SOLN: 250.0000 mL | INTRAVENOUS | Status: DC | PRN

## 2015-05-14 MED ORDER — ONDANSETRON HCL 4 MG/2ML IJ SOLN: 4.0000 mg | Freq: Four times a day (QID) | INTRAMUSCULAR | Status: DC | PRN

## 2015-05-14 MED ORDER — HEPARIN (PORCINE) IN NACL 100-0.45 UNIT/ML-% IJ SOLN
700.0000 [IU]/h | INTRAMUSCULAR | Status: DC
  Administered 2015-05-14: 700 [IU]/h via INTRAVENOUS
  Filled 2015-05-14: qty 250

## 2015-05-14 MED ORDER — SODIUM CHLORIDE 0.9 % IJ SOLN: 3.0000 mL | INTRAMUSCULAR | Status: DC | PRN

## 2015-05-14 MED ORDER — HEPARIN BOLUS VIA INFUSION
1500.0000 [IU] | Freq: Once | INTRAVENOUS | Status: AC
  Administered 2015-05-14: 1500 [IU] via INTRAVENOUS
  Filled 2015-05-14: qty 1500

## 2015-05-14 MED ORDER — DIGOXIN 250 MCG PO TABS
0.2500 mg | ORAL_TABLET | Freq: Every day | ORAL | Status: DC
  Administered 2015-05-15: 0.25 mg via ORAL
  Filled 2015-05-14: qty 1

## 2015-05-14 MED ORDER — ASPIRIN 325 MG PO TABS
325.0000 mg | ORAL_TABLET | Freq: Once | ORAL | Status: DC
  Filled 2015-05-14: qty 1

## 2015-05-14 MED ORDER — POTASSIUM CHLORIDE 10 MEQ/100ML IV SOLN
10.0000 meq | INTRAVENOUS | Status: AC
Start: 2015-05-14 — End: 2015-05-14
  Administered 2015-05-14 (×2): 10 meq via INTRAVENOUS
  Filled 2015-05-14 (×2): qty 100

## 2015-05-14 MED ORDER — HEPARIN BOLUS VIA INFUSION
3000.0000 [IU] | Freq: Once | INTRAVENOUS | Status: AC
Start: 2015-05-14 — End: 2015-05-14
  Administered 2015-05-14: 3000 [IU] via INTRAVENOUS
  Filled 2015-05-14: qty 3000

## 2015-05-14 NOTE — ED Notes (Signed)
Paged Dr. Claiborne Billings to [phone]

## 2015-05-14 NOTE — H&P (Signed)
Patient ID: TASMIN EXANTUS MRN: [id number], DOB/AGE: 09/20/1950   Admit date: 05/14/2015   Primary Physician: Florina Ou, MD Primary Cardiologist: Dr. Martinique   Pt. Profile:  64 year old female with no documented history of CAD but with a history of paroxysmal atrial fibrillation, hypertension, hyperlipidemia and family history of coronary artery disease, presenting with chest pain symptomatology concerning for unstable angina/acute coronary syndrome.  Problem List  Past Medical History  Diagnosis Date  . Bronchitis     recurrent  . Paroxysmal atrial fibrillation   . Renal insufficiency   . Hypothyroidism   . Glaucoma   . Hypotension     orthostatic  . Fibromyalgia   . Osteoarthritis   . Hypertension   . Vasomotor rhinitis     exacerbates VCD  . Asthma     - normal spirometry 2009 FEV1  >90% predicted.  - methacoline challenge neg 11/09  . Vocal cord dysfunction     proven on FOB 9/09  . Chronic cough     - sinus CT 03-23-10 > neg.  - allergy profile 03-23-10 > nl, IgE 14.  - flutter valve rx 03-23-10  . GERD (gastroesophageal reflux disease)     exacerbating VCD  . Hyperthyroidism     with hot nodule.  - total thyroidectomy 11-05-08 benign  . Abnormal EKG   . Anemia   . Cataract   . Heart murmur   . Fibromyalgia     Past Surgical History  Procedure Laterality Date  . Abdominal hysterectomy  1980  . Scar tissue removal  1982  . Thyroidectomy  11-05-08  . Basal cell carcinoma excision  11/08  . Eye surgery    . Abdominal hysterectomy    . Thyroid surgery  2010     Allergies  Allergies  Allergen Reactions  . Codeine     REACTION: itching  . Erythromycin     REACTION: nausea and vomiting  . Sulfonamide Derivatives     REACTION: nausea and vomiting  . Tramadol Hcl     REACTION: itching    HPI  The patient is a 64 y/o female, followed by Dr. Martinique, presenting with chest pain. She has no documented h/o CAD. She underwent a Myoview NST 03/01/12 that  was negative for ischemia. She does have a h/o PAF, on rate control therapy with metoprolol and digoxin. She is not on oral anticoagulation. Her most recent 2D echo was 02/27/2012. LVEF was normal at 65-70%. G1DD was noted. There were no WMA and no significant valvular dysfunction. Additional PMH includes hyperthyroidism w/ h/o a hot nodule, s/p total thyroidectomy in 2010 and now hypothyroid, on levothyroxine. Also with h/o HTN, HLD, asthma, GERD and fibromyalgia. She denies any history of diabetes or tobacco use. Her family hx is significant for CAD. Her father had CABG at age 61.   She presents to the [facility] emergency department today with a complaint of worsening chest discomfort. She notes left-sided chest pressure radiating to the left shoulder and down her left arm off and on for the last week. Symptoms have progressively worsened. She notes exertional chest discomfort however last night she developed severe resting chest discomfort. She has had no relief with antacids. She notes associated exertional dyspnea, weakness/fatigue and nausea but no vomiting. She also notes recurrent symptoms of atrial fibrillation. She has had intermittent episodes of palpitations and near syncope over the last several weeks. She denies syncope. She reports full medication compliance. She does admit that she is  not on a statin for cholesterol. She reports that she was on fenofibrate at one point but this was discontinued by her PCP because her levels were well-controlled.  In the ED, Troponin I is negative x 1. EKG shows diffuse inferolateral ST/T wave abnormalities, also seen on prior EKGs. Her abnormal EKGs have also been mentioned in Dr. Doug Sou clinic notes. CXR is unremarkable. Labs are notable for hypokalemia with K of 2.5. Mg pending. She has mild leukocytosis with a WBC of 11.5. She is afebrile. She denies fever, chills. She is still with mild left sided arm pain. IV heparin and IV nitro has been initiated.     Home Medications  Prior to Admission medications   Medication Sig Start Date End Date Taking? Authorizing Provider  Acetaminophen (TYLENOL ARTHRITIS PAIN PO) Take by mouth as needed.      Historical Provider, MD  ALPRAZolam Duanne Moron) 0.25 MG tablet Take 0.25 mg by mouth as needed.      Historical Provider, MD  amitriptyline (ELAVIL) 50 MG tablet Take 50 mg by mouth at bedtime.      Historical Provider, MD  Ascorbic Acid (VITAMIN C) 100 MG tablet Take 100 mg by mouth daily.    Historical Provider, MD  Bimatoprost (LUMIGAN) 0.01 % SOLN Apply to eye at bedtime.     Historical Provider, MD  calcium-vitamin D (OSCAL) 250-125 MG-UNIT per tablet Take 1 tablet by mouth daily.    Historical Provider, MD  cycloSPORINE (RESTASIS) 0.05 % ophthalmic emulsion Apply 1 drop to eye 2 (two) times daily.    Historical Provider, MD  dexlansoprazole (DEXILANT) 60 MG capsule Take 1 capsule (60 mg total) by mouth daily. 10/20/11   Elsie Stain, MD  digoxin (LANOXIN) 0.125 MG tablet Take 1 tablet (0.125 mg total) by mouth daily. 12/06/12   Peter M Martinique, MD  fenofibrate (TRIGLIDE) 50 MG tablet Take 50 mg by mouth daily.    Historical Provider, MD  furosemide (LASIX) 20 MG tablet Take 20 mg by mouth daily.      Historical Provider, MD  ibuprofen (ADVIL,MOTRIN) 800 MG tablet As needed    Historical Provider, MD  levothyroxine (SYNTHROID, LEVOTHROID) 75 MCG tablet Take 75 mcg by mouth daily.      Historical Provider, MD  metoprolol succinate (TOPROL-XL) 25 MG 24 hr tablet Take 1 tablet (25 mg total) by mouth daily. 11/11/13   Peter M Martinique, MD  ranitidine (ZANTAC) 300 MG tablet Take 300 mg by mouth at bedtime.      Historical Provider, MD    Family History  Family History  Problem Relation Age of Onset  . Asthma Mother   . Heart disease Mother   . Asthma Grandchild   . Emphysema Father   . Heart disease Father     Social History  Social History   Social History  . Marital Status: Married    Spouse  Name: N/A  . Number of Children: 1  . Years of Education: N/A   Occupational History  . owns a plumbing business   . OWNER    Social History Main Topics  . Smoking status: Never Smoker   . Smokeless tobacco: Never Used  . Alcohol Use: 0.6 oz/week    1 Glasses of wine per week     Comment: occasional wine or beer  . Drug Use: No  . Sexual Activity: Yes    Birth Control/ Protection: None   Other Topics Concern  . Not on file   Social  History Narrative     Review of Systems General:  No chills, fever, night sweats or weight changes.  Cardiovascular:  No chest pain, dyspnea on exertion, edema, orthopnea, palpitations, paroxysmal nocturnal dyspnea. Dermatological: No rash, lesions/masses Respiratory: No cough, dyspnea Urologic: No hematuria, dysuria Abdominal:   No nausea, vomiting, diarrhea, bright red blood per rectum, melena, or hematemesis Neurologic:  No visual changes, wkns, changes in mental status. All other systems reviewed and are otherwise negative except as noted above.  Physical Exam  Blood pressure 137/85, pulse 79, temperature 98.7 F (37.1 C), temperature source Oral, resp. rate 18, height 5\' 4"  (1.626 m), weight 120 lb (54.432 kg), SpO2 100 %.  General: Pleasant, NAD Psych: Normal affect. Neuro: Alert and oriented X 3. Moves all extremities spontaneously. HEENT: Normal  Neck: Supple without bruits or JVD. Lungs:  Resp regular and unlabored, CTA. Heart: RRR soft 1/6 SM at RUSB. Abdomen: Soft, non-tender, non-distended, BS + x 4.  Extremities: No clubbing, cyanosis or edema. DP/PT/Radials 2+ and equal bilaterally.  Labs  Troponin (Point of Care Test) No results for input(s): TROPIPOC in the last 72 hours.  Recent Labs  05/14/15 1249  TROPONINI <0.03   Lab Results  Component Value Date   WBC 11.5* 05/14/2015   HGB 14.6 05/14/2015   HCT 44.5 05/14/2015   MCV 78.9 05/14/2015   PLT 377 05/14/2015    Recent Labs Lab 05/14/15 1249  NA 138  K  2.5*  CL 98*  CO2 28  BUN 10  CREATININE 0.98  CALCIUM 9.5  GLUCOSE 102*   No results found for: CHOL, HDL, LDLCALC, TRIG No results found for: DDIMER   Radiology/Studies  Dg Chest 2 View  05/14/2015   CLINICAL DATA:  Chest and left arm pain. Irregular heartbeat. Symptoms today.  EXAM: CHEST  2 VIEW  COMPARISON:  PA and lateral chest 09/28/2010.  FINDINGS: The lungs are clear. Heart size is normal. No pneumothorax or pleural effusion. Thoracic spondylosis is noted.  IMPRESSION: No acute disease.   Electronically Signed   By: Inge Rise M.D.   On: 05/14/2015 13:45    ECG  diffuse inferolateral ST/T wave abnormalities  ASSESSMENT AND PLAN  1. Unstable Angina/ACS: Patient's symptomatology is very concerning for acute coronary syndrome, noting worsening left-sided chest pressure/left arm pain, exacerbated by exertion but also occurring at rest. Also with associated dyspnea on exertion and fatigue. EKG shows diffuse inferolateral ST-T wave abnormalities. Initial troponin in the ED is negative 1. Agree with IV heparin and IV nitroglycerin. Given her symptoms, would forego nuclear stress testing and recommend definitive left heart catheterization in the a.m. Will make NPO at midnight. Continue IV heparin and IV nitroglycerin. Continue home Metoprolol. Will add high dose statin and low-dose aspirin. Will also obtain a 2-D echocardiogram. Continue to cycle cardiac enzymes 3.  2. PAF: Patient is currently in normal sinus rhythm however patient reports recurrent symptoms of breakthrough atrial fibrillation off and on for the last several weeks. Rate is controlled with Metroprolol and digoxin. She is not currently on anticoagulation for stroke prophylaxis. Given her history of hypertension, age of 69-73 and female sex, her [id number] VASc score is at least 3, placing her at increased risks for stroke/TIA. Therefore, recommend initiation of oral anticoagulation for stroke prophylaxis. However, will  delay initiation after her ischemic evaluation is complete. For now continue IV heparin. Can consider either Eliquis or Xarelto prior to discharge. Continue metoprolol and digoxin.   3. HTN: BP is well controlled.  Continue metoprolol.   4. HLD: Given concerns for acute coronary syndrome will initiate high-dose Lipitor. Will check a fasting lipid panel in the a.m. as well as hepatic function test.  5. Hypothyroidism: continue Synthroid.   6. Hypokalemia: 2.5 in the ER. Supplemental K already given. Mg pending. Will need to follow closely given the fact that she is on digoxin and has PAF.   7. Leukocytosis: White count is mildly elevated at 11.5. She is afebrile and denies subjective fever or chills. Chest x-ray is unremarkable. She denies dysuria. Will follow closely.   Signed, Lyda Jester, PA-C 05/14/2015, 4:04 PM  I have examined the patient and reviewed assessment and plan and discussed with patient.  Agree with above as stated.  Concerning symptoms and ECG changes. Plan for cardiac cath tomorrow. She is agreeable. I explained her that if her symptoms do not improve, or if they get worse, she needs to let her know. Cardiac cath may need to happen sooner than tomorrow depending on blood test results and symptoms.  VARANASI,JAYADEEP S.

## 2015-05-14 NOTE — Plan of Care (Signed)
Problem: Phase I Progression Outcomes Goal: Other Phase I Outcomes/Goals Outcome: Completed/Met Date Met:  05/14/15 Video #115 [place] shown to patient and husband

## 2015-05-14 NOTE — ED Notes (Signed)
Cards PA at bedside. 

## 2015-05-14 NOTE — ED Provider Notes (Signed)
CSN: [id number]     Arrival date & time 05/14/15  1223 History   First MD Initiated Contact with Patient 05/14/15 1538     Chief Complaint  Patient presents with  . Chest Pain     (Consider location/radiation/quality/duration/timing/severity/associated sxs/prior Treatment) The history is provided by the patient.  Kaitlyn Adams is a 64 y.o. female history of paroxysmal A. Fib not on coumadin, here presenting with shortness of breath, chest pain. Patient has been having l sided chest pain worse with exertion that is going on for a week. Also has shortness of breath with exertion. Has chronic left leg swelling that is not getting worse. Sometimes pain radiate down left arm. Has some nonproductive cough as well. Went to clinic and sent for abnormal EKG.    Past Medical History  Diagnosis Date  . Bronchitis     recurrent  . Paroxysmal atrial fibrillation   . Renal insufficiency   . Hypothyroidism   . Glaucoma   . Hypotension     orthostatic  . Fibromyalgia   . Osteoarthritis   . Hypertension   . Vasomotor rhinitis     exacerbates VCD  . Asthma     - normal spirometry 2009 FEV1  >90% predicted.  - methacoline challenge neg 11/09  . Vocal cord dysfunction     proven on FOB 9/09  . Chronic cough     - sinus CT 03-23-10 > neg.  - allergy profile 03-23-10 > nl, IgE 14.  - flutter valve rx 03-23-10  . GERD (gastroesophageal reflux disease)     exacerbating VCD  . Hyperthyroidism     with hot nodule.  - total thyroidectomy 11-05-08 benign  . Abnormal EKG   . Anemia   . Cataract   . Heart murmur   . Fibromyalgia    Past Surgical History  Procedure Laterality Date  . Abdominal hysterectomy  1980  . Scar tissue removal  1982  . Thyroidectomy  11-05-08  . Basal cell carcinoma excision  11/08  . Eye surgery    . Abdominal hysterectomy    . Thyroid surgery  2010   Family History  Problem Relation Age of Onset  . Asthma Mother   . Heart disease Mother   . Asthma Grandchild   .  Emphysema Father   . Heart disease Father    Social History  Substance Use Topics  . Smoking status: Never Smoker   . Smokeless tobacco: Never Used  . Alcohol Use: 0.6 oz/week    1 Glasses of wine per week     Comment: occasional wine or beer   OB History    No data available     Review of Systems  Cardiovascular: Positive for chest pain.  All other systems reviewed and are negative.     Allergies  Codeine; Erythromycin; Sulfonamide derivatives; and Tramadol hcl  Home Medications   Prior to Admission medications   Medication Sig Start Date End Date Taking? Authorizing Provider  Acetaminophen (TYLENOL ARTHRITIS PAIN PO) Take by mouth as needed.      Historical Provider, MD  ALPRAZolam Duanne Moron) 0.25 MG tablet Take 0.25 mg by mouth as needed.      Historical Provider, MD  amitriptyline (ELAVIL) 50 MG tablet Take 50 mg by mouth at bedtime.      Historical Provider, MD  Ascorbic Acid (VITAMIN C) 100 MG tablet Take 100 mg by mouth daily.    Historical Provider, MD  Bimatoprost (LUMIGAN) 0.01 % SOLN Apply  to eye at bedtime.     Historical Provider, MD  calcium-vitamin D (OSCAL) 250-125 MG-UNIT per tablet Take 1 tablet by mouth daily.    Historical Provider, MD  cycloSPORINE (RESTASIS) 0.05 % ophthalmic emulsion Apply 1 drop to eye 2 (two) times daily.    Historical Provider, MD  dexlansoprazole (DEXILANT) 60 MG capsule Take 1 capsule (60 mg total) by mouth daily. 10/20/11   Elsie Stain, MD  digoxin (LANOXIN) 0.125 MG tablet Take 1 tablet (0.125 mg total) by mouth daily. 12/06/12   Peter M Martinique, MD  fenofibrate (TRIGLIDE) 50 MG tablet Take 50 mg by mouth daily.    Historical Provider, MD  furosemide (LASIX) 20 MG tablet Take 20 mg by mouth daily.      Historical Provider, MD  ibuprofen (ADVIL,MOTRIN) 800 MG tablet As needed    Historical Provider, MD  levothyroxine (SYNTHROID, LEVOTHROID) 75 MCG tablet Take 75 mcg by mouth daily.      Historical Provider, MD  metoprolol  succinate (TOPROL-XL) 25 MG 24 hr tablet Take 1 tablet (25 mg total) by mouth daily. 11/11/13   Peter M Martinique, MD  ranitidine (ZANTAC) 300 MG tablet Take 300 mg by mouth at bedtime.      Historical Provider, MD   BP 137/85 mmHg  Pulse 79  Temp(Src) 98.7 F (37.1 C) (Oral)  Resp 18  Ht 5\' 4"  (1.626 m)  Wt 120 lb (54.432 kg)  BMI 20.59 kg/m2  SpO2 100% Physical Exam  Constitutional: She is oriented to person, place, and time.  Uncomfortable   HENT:  Head: Normocephalic.  Mouth/Throat: Oropharynx is clear and moist.  Eyes: Conjunctivae are normal. Pupils are equal, round, and reactive to light.  Neck: Normal range of motion. Neck supple.  Cardiovascular: Normal rate and regular rhythm.   Pulmonary/Chest: Effort normal and breath sounds normal. No respiratory distress. She has no wheezes. She has no rales.  Abdominal: Soft. Bowel sounds are normal. She exhibits no distension. There is no tenderness. There is no rebound.  Musculoskeletal: Normal range of motion. She exhibits no edema or tenderness.  Neurological: She is alert and oriented to person, place, and time. No cranial nerve deficit. Coordination normal.  Skin: Skin is warm and dry.  Psychiatric: She has a normal mood and affect. Her behavior is normal. Judgment and thought content normal.  Nursing note and vitals reviewed.   ED Course  Procedures (including critical care time)   CRITICAL CARE Performed by: Darl Householder, DAVID   Total critical care time: 30 min   Critical care time was exclusive of separately billable procedures and treating other patients.  Critical care was necessary to treat or prevent imminent or life-threatening deterioration.  Critical care was time spent personally by me on the following activities: development of treatment plan with patient and/or surrogate as well as nursing, discussions with consultants, evaluation of patient's response to treatment, examination of patient, obtaining history from  patient or surrogate, ordering and performing treatments and interventions, ordering and review of laboratory studies, ordering and review of radiographic studies, pulse oximetry and re-evaluation of patient's condition.   Labs Review Labs Reviewed  BASIC METABOLIC PANEL - Abnormal; Notable for the following:    Potassium 2.5 (*)    Chloride 98 (*)    Glucose, Bld 102 (*)    GFR calc non Af Amer 59 (*)    All other components within normal limits  CBC - Abnormal; Notable for the following:    WBC 11.5 (*)  RBC 5.64 (*)    MCH 25.9 (*)    All other components within normal limits  TROPONIN I  MAGNESIUM    Imaging Review Dg Chest 2 View  05/14/2015   CLINICAL DATA:  Chest and left arm pain. Irregular heartbeat. Symptoms today.  EXAM: CHEST  2 VIEW  COMPARISON:  PA and lateral chest 09/28/2010.  FINDINGS: The lungs are clear. Heart size is normal. No pneumothorax or pleural effusion. Thoracic spondylosis is noted.  IMPRESSION: No acute disease.   Electronically Signed   By: Inge Rise M.D.   On: 05/14/2015 13:45   I have personally reviewed and evaluated these images and lab results as part of my medical decision-making.   EKG Interpretation None      MDM   Final diagnoses:  None    Kaitlyn Adams is a 64 y.o. female here with L sided chest pain, shortness of breath. EKG concerned for possible STEMI. I reviewed initial EKG around 3:40 when I saw the patient. Initial ekg reviewed by previous provider and was thought to be chronic changes. Given active pain, repeat EKG done and showed worsening ST depression V4, V5 and possible elevation aVR. Consulted Dr. Claiborne Billings at 3:50 pm, who will come to see patient right away. Started on heparin and nitro drip. Given full dose ASA. K 2.5, supplemented.   4: 30 pm Cardiology saw patient and decided not to activate STEMI. On nitro and heparin, pain controlled. K supplemented. Plans on admitting patient for unstable angina and cath  tomorrow.     Wandra Arthurs, MD 05/14/15 [phone]

## 2015-05-14 NOTE — ED Notes (Signed)
Pt presents with 2 week h/o palpitations; pt reports 1 week h/o chest pain that has been constant, radiates into L arm and scapula.  Pt reports shortness of breath, dry cough.

## 2015-05-14 NOTE — Progress Notes (Signed)
ANTICOAGULATION CONSULT NOTE - Initial Consult  Pharmacy Consult for heparin Indication: chest pain/ACS  Allergies  Allergen Reactions  . Codeine     REACTION: itching  . Erythromycin     REACTION: nausea and vomiting  . Sulfonamide Derivatives     REACTION: nausea and vomiting  . Tramadol Hcl     REACTION: itching    Patient Measurements: Height: 5\' 4"  (162.6 cm) Weight: 120 lb (54.432 kg) IBW/kg (Calculated) : 54.7 Heparin Dosing Weight:   Vital Signs: Temp: 98.7 F (37.1 C) (09/22 1240) Temp Source: Oral (09/22 1240) BP: 137/85 mmHg (09/22 1240) Pulse Rate: 79 (09/22 1240)  Labs:  Recent Labs  05/14/15 1249  HGB 14.6  HCT 44.5  PLT 377  CREATININE 0.98  TROPONINI <0.03    Estimated Creatinine Clearance: 49.1 mL/min (by C-G formula based on Cr of 0.98).   Medical History: Past Medical History  Diagnosis Date  . Bronchitis     recurrent  . Paroxysmal atrial fibrillation   . Renal insufficiency   . Hypothyroidism   . Glaucoma   . Hypotension     orthostatic  . Fibromyalgia   . Osteoarthritis   . Hypertension   . Vasomotor rhinitis     exacerbates VCD  . Asthma     - normal spirometry 2009 FEV1  >90% predicted.  - methacoline challenge neg 11/09  . Vocal cord dysfunction     proven on FOB 9/09  . Chronic cough     - sinus CT 03-23-10 > neg.  - allergy profile 03-23-10 > nl, IgE 14.  - flutter valve rx 03-23-10  . GERD (gastroesophageal reflux disease)     exacerbating VCD  . Hyperthyroidism     with hot nodule.  - total thyroidectomy 11-05-08 benign  . Abnormal EKG   . Anemia   . Cataract   . Heart murmur   . Fibromyalgia     Medications:   (Not in a hospital admission)  Assessment: 64 yo female admitted with chest pain. Initial troponin is negative. Pt is not on any anticoagulation PTA. CBC is stable.  Initiating heparin gtt for ACS.  Goal of Therapy:  Heparin level 0.3-0.7 units/ml Monitor platelets by anticoagulation protocol: Yes    Plan:  Heparin 3000 units x1 then 700 units/hr Check evening heparin level  Daily HL, CBC Monitor s/sx bleeding F/u cards recommendations   Hughes Better, PharmD, BCPS Clinical Pharmacist Pager: [phone] 05/14/2015 3:54 PM

## 2015-05-14 NOTE — ED Provider Notes (Signed)
Initial EKG reviewed from triage. Pt with similar morphology of ST-T segments with inversion and depression present with some elevation in aVR dating back to 2009 on multiple available studies. Do not feel EKG meets STEMI criteria currently with current study essentially unchanged. Plan for triage lab draw including troponin.   Leo Grosser, MD 05/14/15 1700

## 2015-05-14 NOTE — Progress Notes (Addendum)
ANTICOAGULATION CONSULT NOTE -  Follow up consult  Pharmacy Consult for heparin Indication: chest pain/ACS  Allergies  Allergen Reactions  . Codeine     REACTION: itching  . Erythromycin     REACTION: nausea and vomiting  . Sulfonamide Derivatives     REACTION: nausea and vomiting  . Tramadol Hcl     REACTION: itching    Patient Measurements: Height: 5\' 4"  (162.6 cm) Weight: 122 lb 9.6 oz (55.611 kg) IBW/kg (Calculated) : 54.7 Heparin Dosing Weight: 55.6 kg  Vital Signs: Temp: 98.2 F (36.8 C) (09/22 2043) Temp Source: Oral (09/22 2043) BP: 115/60 mmHg (09/22 2000) Pulse Rate: 72 (09/22 2000)  Labs:  Recent Labs  05/14/15 1249 05/14/15 1920 05/14/15 2130  HGB 14.6  --   --   HCT 44.5  --   --   PLT 377  --   --   HEPARINUNFRC  --   --  0.12*  CREATININE 0.98  --   --   TROPONINI <0.03 <0.03  --     Estimated Creatinine Clearance: 49.4 mL/min (by C-G formula based on Cr of 0.98).   Medical History: Past Medical History  Diagnosis Date  . Bronchitis     recurrent  . Paroxysmal atrial fibrillation   . Renal insufficiency   . Hypothyroidism   . Glaucoma   . Hypotension     orthostatic  . Fibromyalgia   . Osteoarthritis   . Hypertension   . Vasomotor rhinitis     exacerbates VCD  . Asthma     - normal spirometry 2009 FEV1  >90% predicted.  - methacoline challenge neg 11/09  . Vocal cord dysfunction     proven on FOB 9/09  . Chronic cough     - sinus CT 03-23-10 > neg.  - allergy profile 03-23-10 > nl, IgE 14.  - flutter valve rx 03-23-10  . GERD (gastroesophageal reflux disease)     exacerbating VCD  . Hyperthyroidism     with hot nodule.  - total thyroidectomy 11-05-08 benign  . Abnormal EKG   . Anemia   . Cataract   . Heart murmur   . Fibromyalgia     Medications:  Prescriptions prior to admission  Medication Sig Dispense Refill Last Dose  . amitriptyline (ELAVIL) 50 MG tablet Take 50 mg by mouth at bedtime.     05/13/2015 at Unknown time  .  digoxin (LANOXIN) 0.125 MG tablet Take 1 tablet (0.125 mg total) by mouth daily. (Patient taking differently: Take 0.25 mg by mouth daily. ) 90 tablet 3 05/14/2015 at Unknown time  . furosemide (LASIX) 20 MG tablet Take 20 mg by mouth daily.     05/14/2015 at Unknown time  . Ketotifen Fumarate (THERATEARS ALLERGY OP) Apply 1 drop to eye daily.   Past Week at Unknown time  . latanoprost (XALATAN) 0.005 % ophthalmic solution 1 drop at bedtime.   05/13/2015 at Unknown time  . levothyroxine (SYNTHROID, LEVOTHROID) 75 MCG tablet Take 75 mcg by mouth daily.     05/14/2015 at Unknown time  . metoprolol succinate (TOPROL-XL) 25 MG 24 hr tablet Take 1 tablet (25 mg total) by mouth daily. 90 tablet 0 05/14/2015 at 0800  . omeprazole (PRILOSEC) 20 MG capsule Take 20 mg by mouth daily.   05/14/2015 at Unknown time  . Acetaminophen (TYLENOL ARTHRITIS PAIN PO) Take by mouth as needed.     Taking  . ALPRAZolam (XANAX) 0.25 MG tablet Take 0.25 mg by mouth  as needed.     Taking  . Ascorbic Acid (VITAMIN C) 100 MG tablet Take 100 mg by mouth daily.   Taking  . Bimatoprost (LUMIGAN) 0.01 % SOLN Apply to eye at bedtime.    Taking  . calcium-vitamin D (OSCAL) 250-125 MG-UNIT per tablet Take 1 tablet by mouth daily.   Taking  . cycloSPORINE (RESTASIS) 0.05 % ophthalmic emulsion Apply 1 drop to eye 2 (two) times daily.   Taking  . dexlansoprazole (DEXILANT) 60 MG capsule Take 1 capsule (60 mg total) by mouth daily. 30 capsule 6 Taking  . fenofibrate (TRIGLIDE) 50 MG tablet Take 50 mg by mouth daily.   Taking  . ibuprofen (ADVIL,MOTRIN) 800 MG tablet As needed   Taking  . ranitidine (ZANTAC) 300 MG tablet Take 300 mg by mouth at bedtime.     Taking    Assessment: 64 yo female admitted today with chest pain.Troponin  negative x2. Pt was not on any anticoagulation PTA. CBC was stable.  Heparin gtt started for ACS.  The 5 hr heparin level is 0.12 on 700 units/hr IV heparin. First level drawn ~45 minutes early. Subtherapeutic  level. No bleeding noted.   Cardiology recommended to continue IV heparin and  plan for cath tomorrow.   Goal of Therapy:  Heparin level 0.3-0.7 units/ml Monitor platelets by anticoagulation protocol: Yes   Plan:  Heparin 1500 units x1 then increase heparin drip to 850 units/hr. Check evening heparin level  Daily HL, CBC Monitor s/sx bleeding Plan for cath tomorrow.    Hughes Better, PharmD, BCPS Clinical Pharmacist Pager: [phone] 05/14/2015 9:58 PM

## 2015-05-14 NOTE — Telephone Encounter (Signed)
Leafy Ro called in stating that the pt is currently in the office complaining of palpitations and sob. Reita Cliche wanted the pt to be evaluated as soon as possible. Please call the pt back   Thanks

## 2015-05-14 NOTE — Telephone Encounter (Signed)
Attempted to phone caller with the information provided.  I received VM from [facility] that I needed to call the person directly since this shows up in our caller ID but is not a method to recall suing this number.  Patient has not been seen in since 2014.  With the person from an office leaving the message that person was having Shortness of Breath and palpitations was going to refer them to the ED or have them call 9-1-1

## 2015-05-15 ENCOUNTER — Encounter (HOSPITAL_COMMUNITY)
Admission: EM | Disposition: A | Discharge status: Home / Self Care | Payer: Self-pay | Source: Home / Self Care | Attending: Cardiology

## 2015-05-15 ENCOUNTER — Other Ambulatory Visit: Payer: Self-pay | Admitting: Cardiology

## 2015-05-15 ENCOUNTER — Inpatient Hospital Stay (HOSPITAL_COMMUNITY): Payer: BLUE CROSS/BLUE SHIELD

## 2015-05-15 ENCOUNTER — Encounter (HOSPITAL_COMMUNITY): Payer: Self-pay | Admitting: Cardiovascular Disease

## 2015-05-15 DIAGNOSIS — R079 Chest pain, unspecified: Secondary | ICD-10-CM | POA: Diagnosis present

## 2015-05-15 DIAGNOSIS — I480 Paroxysmal atrial fibrillation: Secondary | ICD-10-CM

## 2015-05-15 DIAGNOSIS — E876 Hypokalemia: Secondary | ICD-10-CM

## 2015-05-15 DIAGNOSIS — R000 Tachycardia, unspecified: Secondary | ICD-10-CM

## 2015-05-15 DIAGNOSIS — I25110 Atherosclerotic heart disease of native coronary artery with unstable angina pectoris: Secondary | ICD-10-CM

## 2015-05-15 HISTORY — PX: CARDIAC CATHETERIZATION: SHX172

## 2015-05-15 LAB — HEPARIN LEVEL (UNFRACTIONATED): Heparin Unfractionated: 0.35 IU/mL (ref 0.30–0.70)

## 2015-05-15 LAB — LIPID PANEL
CHOLESTEROL: 206 mg/dL — AB (ref 0–200)
HDL: 31 mg/dL — AB (ref >40)
LDL CALC: 140 mg/dL — AB (ref 0–99)
TRIGLYCERIDES: 173 mg/dL — AB (ref <150)
Total CHOL/HDL Ratio: 6.6 RATIO
VLDL: 35 mg/dL (ref 0–40)

## 2015-05-15 LAB — POTASSIUM: Potassium: 3.5 mmol/L (ref 3.5–5.1)

## 2015-05-15 LAB — BASIC METABOLIC PANEL
ANION GAP: 8 (ref 5–15)
BUN: 10 mg/dL (ref 6–20)
CHLORIDE: 101 mmol/L (ref 101–111)
CO2: 25 mmol/L (ref 22–32)
Calcium: 8.2 mg/dL — ABNORMAL LOW (ref 8.9–10.3)
Creatinine, Ser: 0.93 mg/dL (ref 0.44–1.00)
GFR calc Af Amer: >60 mL/min (ref >60)
GFR calc non Af Amer: >60 mL/min (ref >60)
Glucose, Bld: 104 mg/dL — ABNORMAL HIGH (ref 65–99)
POTASSIUM: 3 mmol/L — AB (ref 3.5–5.1)
SODIUM: 134 mmol/L — AB (ref 135–145)

## 2015-05-15 LAB — PROTIME-INR
INR: 1.2 (ref 0.00–1.49)
PROTHROMBIN TIME: 15.3 s — AB (ref 11.6–15.2)

## 2015-05-15 LAB — TROPONIN I
Troponin I: <0.03 ng/mL (ref <0.031)
Troponin I: <0.03 ng/mL (ref <0.031)

## 2015-05-15 SURGERY — LEFT HEART CATH AND CORONARY ANGIOGRAPHY
Anesthesia: LOCAL

## 2015-05-15 MED ORDER — VERAPAMIL HCL 2.5 MG/ML IV SOLN
INTRAVENOUS | Status: AC
  Filled 2015-05-15: qty 2

## 2015-05-15 MED ORDER — MIDAZOLAM HCL 2 MG/2ML IJ SOLN
INTRAMUSCULAR | Status: AC
  Filled 2015-05-15: qty 4

## 2015-05-15 MED ORDER — VERAPAMIL HCL 2.5 MG/ML IV SOLN
INTRAVENOUS | Status: DC | PRN
  Administered 2015-05-15: 10:00:00 via INTRA_ARTERIAL

## 2015-05-15 MED ORDER — RIVAROXABAN 20 MG PO TABS: 20.0000 mg | ORAL_TABLET | Freq: Every day | ORAL | Status: DC

## 2015-05-15 MED ORDER — DIGOXIN 125 MCG PO TABS: 0.2500 mg | ORAL_TABLET | Freq: Every day | ORAL | Status: DC

## 2015-05-15 MED ORDER — MIDAZOLAM HCL 2 MG/2ML IJ SOLN
INTRAMUSCULAR | Status: DC | PRN
  Administered 2015-05-15 (×2): 1 mg via INTRAVENOUS

## 2015-05-15 MED ORDER — LIDOCAINE HCL (PF) 1 % IJ SOLN
INTRAMUSCULAR | Status: DC | PRN
  Administered 2015-05-15: 10:00:00

## 2015-05-15 MED ORDER — IOHEXOL 350 MG/ML SOLN
INTRAVENOUS | Status: DC | PRN
  Administered 2015-05-15: 100 mL via INTRAVENOUS

## 2015-05-15 MED ORDER — HEPARIN SODIUM (PORCINE) 1000 UNIT/ML IJ SOLN
INTRAMUSCULAR | Status: DC | PRN
  Administered 2015-05-15: 3000 [IU] via INTRAVENOUS

## 2015-05-15 MED ORDER — SODIUM CHLORIDE 0.9 % IV SOLN: INTRAVENOUS | Status: AC

## 2015-05-15 MED ORDER — SODIUM CHLORIDE 0.9 % IJ SOLN
3.0000 mL | Freq: Two times a day (BID) | INTRAMUSCULAR | Status: DC
  Administered 2015-05-15: 3 mL via INTRAVENOUS

## 2015-05-15 MED ORDER — FENTANYL CITRATE (PF) 100 MCG/2ML IJ SOLN
INTRAMUSCULAR | Status: DC | PRN
  Administered 2015-05-15 (×2): 25 ug via INTRAVENOUS

## 2015-05-15 MED ORDER — LIDOCAINE HCL (PF) 1 % IJ SOLN
INTRAMUSCULAR | Status: DC | PRN
  Administered 2015-05-15: 2 mL

## 2015-05-15 MED ORDER — NITROGLYCERIN 1 MG/10 ML FOR IR/CATH LAB
INTRA_ARTERIAL | Status: AC
  Filled 2015-05-15: qty 10

## 2015-05-15 MED ORDER — SODIUM CHLORIDE 0.9 % IJ SOLN: 3.0000 mL | INTRAMUSCULAR | Status: DC | PRN

## 2015-05-15 MED ORDER — SODIUM CHLORIDE 0.9 % IV SOLN: 250.0000 mL | INTRAVENOUS | Status: DC | PRN

## 2015-05-15 MED ORDER — HEPARIN SODIUM (PORCINE) 1000 UNIT/ML IJ SOLN
INTRAMUSCULAR | Status: AC
  Filled 2015-05-15: qty 1

## 2015-05-15 MED ORDER — POTASSIUM CHLORIDE CRYS ER 20 MEQ PO TBCR
40.0000 meq | EXTENDED_RELEASE_TABLET | Freq: Once | ORAL | Status: AC
  Administered 2015-05-15: 40 meq via ORAL
  Filled 2015-05-15: qty 2

## 2015-05-15 MED ORDER — SODIUM CHLORIDE 0.9 % IV SOLN
INTRAVENOUS | Status: DC | PRN
  Administered 2015-05-15: 999 mL via INTRAVENOUS

## 2015-05-15 MED ORDER — FENTANYL CITRATE (PF) 100 MCG/2ML IJ SOLN
INTRAMUSCULAR | Status: AC
  Filled 2015-05-15: qty 4

## 2015-05-15 MED ORDER — ATORVASTATIN CALCIUM 20 MG PO TABS: 20.0000 mg | ORAL_TABLET | Freq: Every day | ORAL | Status: DC

## 2015-05-15 MED ORDER — HEPARIN (PORCINE) IN NACL 2-0.9 UNIT/ML-% IJ SOLN
INTRAMUSCULAR | Status: AC
  Filled 2015-05-15: qty 1000

## 2015-05-15 MED ORDER — LIDOCAINE HCL (PF) 1 % IJ SOLN
INTRAMUSCULAR | Status: AC
  Filled 2015-05-15: qty 30

## 2015-05-15 SURGICAL SUPPLY — 12 items

## 2015-05-15 NOTE — Progress Notes (Signed)
Pt discharging to home with husband. Discharge teaching and instructions reviewed with pt and pt family. VSS.

## 2015-05-15 NOTE — Discharge Instructions (Signed)
Call [facility] at [phone] if any bleeding, swelling or drainage at cath site.  May shower, no tub baths for 48 hours for groin sticks. No lifting over 5 pounds for 3 days.  No Driving for 3 days.    Have lab work done on Monday at Dr. Doug Sou office on 1 st floor to check your potassium. And to check you lanoxin level - do not take lanoxin that morning until after the lab is drawn.  Stay off lasix.  If you have swelling and feel you need lasix then call the office to adjust your meds.  We will have you begin Xarelto for the atrial fib to prevent strokes.  Begin on 05/16/15.  We gave you a 30 day free card and will discuss more when you are seen in the office.  Heart healthy diet.   We will have you wear a monitor for 30 days.  The office will call and arrange the date and time.

## 2015-05-15 NOTE — Interval H&P Note (Signed)
Cath Lab Visit (complete for each Cath Lab visit)  Clinical Evaluation Leading to the Procedure:   ACS: Yes.    Non-ACS:    Anginal Classification: CCS IV  Anti-ischemic medical therapy: Minimal Therapy (1 class of medications)  Non-Invasive Test Results: No non-invasive testing performed  Prior CABG: No previous CABG      History and Physical Interval Note:  05/15/2015 9:15 AM  Kaitlyn Adams  has presented today for surgery, with the diagnosis of unstable angina  The various methods of treatment have been discussed with the patient and family. After consideration of risks, benefits and other options for treatment, the patient has consented to  Procedure(s): Left Heart Cath and Coronary Angiography (N/A) as a surgical intervention .  The patient's history has been reviewed, patient examined, no change in status, stable for surgery.  I have reviewed the patient's chart and labs.  Questions were answered to the patient's satisfaction.     Sherren Mocha

## 2015-05-15 NOTE — Care Management Note (Addendum)
Case Management Note  Patient Details  Name: Kaitlyn Adams MRN: [id number] Date of Birth: 17-Apr-1951  Subjective/Objective:  Pt admitted for Unstable Angina. Post cardiac cath.                   Action/Plan: No needs identified by CM at this time.    1347 CM received consult for Xarelto. CM did provide pt with 30 day free card. Pt will need Rx for 30 day free no refills and the original with refills. Benefits check in process. Pt uses [facility] on [address]- CM did call and medication is available. No further needs at this time.  Bethena Roys, RN 05/15/2015, 11:33 AM

## 2015-05-15 NOTE — Progress Notes (Signed)
Echocardiogram 2D Echocardiogram has been performed.  Tresa Res 05/15/2015, 11:41 AM

## 2015-05-15 NOTE — Discharge Summary (Signed)
Physician Discharge Summary       Patient ID: Kaitlyn Adams MRN: [id number] DOB/AGE: November 24, 1950 64 y.o.  Admit date: 05/14/2015 Discharge date: 05/15/2015 Primary Cardiologist:Dr. Martinique   Discharge Diagnoses:  Principal Problem:   Chest pain at rest, not due to CAD, possible due to a fib. Active Problems:   Essential hypertension   Paroxysmal atrial fibrillation   Hypothyroidism   S/P cardiac cath, non obstructive disease normal EF   Discharged Condition: good  Procedures: 05/15/15 cardiac cath by Dr. Tyrell Antonio Course: 64 year old female presented to ER on 05/14/15 with chest discomfort.  Lt sided pain with radiation to L shoulder and down her Lt arm off and on for a week. Symptoms progressed to pain at rest.  She all noted symptoms of recurrent a fib.  She has had intermittent episodes of palpitations and near syncope over the last several weeks.  Her K+ level was 2.5 on admit. This was replaced. Her troponins were negative.  Her EKG though abnormal was unchanged.  She was placed on IV heparin and IV NTG and admitted to further eval with plan for cardiac cath.  Today with K+ up to 3.5 she had cardiac cath and found mild nonobstructive disease and normal EF.  Her Echo was normal.  She has been seen by Dr. Jerilynn Mages. Croitoru and found stable for discharge once her post cath orders are complete.  We did discharge with lipitor.   She will wear a 30 day event monitor to eval her a fib and we will place her on xarelto for CHA2DS2VASC score of 3.  For her hypokalemia we recommend she stop her lasix.  We will recheck labs next week on Monday.   i will also check dig level on Monday.  I will give an additional 40 meq prior to discharge. She will follow up in 7-13 days for further eval and with Dr. Martinique after event monitor at his next available.    Consults: None  Significant Diagnostic Studies:  BMP Latest Ref Rng 05/15/2015 05/15/2015 05/14/2015  Glucose 65 - 99 mg/dL - 104(H) 102(H)    BUN 6 - 20 mg/dL - 10 10  Creatinine 0.44 - 1.00 mg/dL - 0.93 0.98  Sodium 135 - 145 mmol/L - 134(L) 138  Potassium 3.5 - 5.1 mmol/L 3.5 3.0(L) 2.5(LL)  Chloride 101 - 111 mmol/L - 101 98(L)  CO2 22 - 32 mmol/L - 25 28  Calcium 8.9 - 10.3 mg/dL - 8.2(L) 9.5   CBC Latest Ref Rng 05/14/2015 02/22/2012 03/26/2010  WBC 4.0 - 10.5 K/uL 11.5(H) 7.3 -  Hemoglobin 12.0 - 15.0 g/dL 14.6 12.0 18.4(H)  Hematocrit 36.0 - 46.0 % 44.5 37.2 54.0(H)  Platelets 150 - 400 K/uL 377 351.0 -   Troponin negative <0.03 X 4  Lipid Panel     Component Value Date/Time   CHOL 206* 05/15/2015 0049   TRIG 173* 05/15/2015 0049   HDL 31* 05/15/2015 0049   CHOLHDL 6.6 05/15/2015 0049   VLDL 35 05/15/2015 0049   LDLCALC 140* 05/15/2015 0049     ECHO: Study Conclusions - Left ventricle: The cavity size was normal. Systolic function was vigorous. The estimated ejection fraction was in the range of 65% to 70%. Wall motion was normal; there were no regional wall motion abnormalities. Left ventricular diastolic function parameters were normal. - Aortic valve: Trileaflet; normal thickness leaflets. There was no regurgitation. - Aortic root: The aortic root was normal in size. - Mitral valve: Structurally normal  valve. - Left atrium: The atrium was normal in size. - Right ventricle: The cavity size was normal. Wall thickness was normal. Systolic function was normal. - Right atrium: The atrium was normal in size. - Tricuspid valve: Structurally normal valve. - Pulmonary arteries: Systolic pressure was within the normal range. - Inferior vena cava: The vessel was normal in size. - Pericardium, extracardiac: There was no pericardial effusion.  CARDIAC CATH: Conclusion    1. Mild nonobstructive CAD as outlined above 2. Normal LV systolic function with normal LVEDP  Recommendations:  Stop ASA  Start Xarelto 20 mg daily tomorrow (CHADS-Vasc = 3 for age/HTN/female gender)  Outpatient event  monitor to evaluate for PAF as cause of symptoms      Coronary Findings    Dominance: Right   Left Anterior Descending   . Dist LAD lesion, 30% stenosed.   . First Diagonal Branch   . 1st Diag lesion, 40% stenosed.     Left Circumflex   . Prox Cx lesion, 25% stenosed.     Right Coronary Artery   . Mid RCA lesion, 30% stenosed.   . Right Posterior Descending Artery   . RPDA lesion, 30% stenosed.      Wall Motion                 Left Heart    Left Ventricle The left ventricular size is normal. The left ventricular systolic function is normal. The left ventricular ejection fraction is greater tha 65% by visual estimate. There are no wall motion abnormalities in the left ventricle.    Coronary Diagrams    Diagnostic Diagram            Implants    Name ID Temporary Type Supply   No information to display    Hemo Data       Most Recent Value   AO Systolic Pressure  95 mmHg   AO Diastolic Pressure  49 mmHg   AO Mean  70 mmHg   LV Systolic Pressure  96 mmHg   LV Diastolic Pressure  1 mmHg   LV EDP  11 mmHg   Arterial Occlusion Pressure Extended Systolic Pressure  93 mmHg   Arterial Occlusion Pressure Extended Diastolic Pressure  48 mmHg   Arterial Occlusion Pressure Extended Mean Pressure  65 mmHg   Left Ventricular Apex Extended Systolic Pressure  96 mmHg   Left Ventricular Apex Extended Diastolic Pressure  2 mmHg   Left Ventricular Apex Extended EDP Pressure      CHEST 2 VIEW COMPARISON: PA and lateral chest 09/28/2010. FINDINGS: The lungs are clear. Heart size is normal. No pneumothorax or pleural effusion. Thoracic spondylosis is noted. IMPRESSION: No acute disease.  Discharge Exam: Blood pressure 102/54, pulse 60, temperature 97.8 F (36.6 C), temperature source Oral, resp. rate 16, height 5\' 4"  (1.626 m), weight 122 lb 9.2 oz (55.6 kg), SpO2 100 %.   Disposition: Home     Medication List    STOP taking  these medications        dexlansoprazole 60 MG capsule  Commonly known as:  DEXILANT     furosemide 20 MG tablet  Commonly known as:  LASIX     ibuprofen 800 MG tablet  Commonly known as:  ADVIL,MOTRIN     ranitidine 300 MG tablet  Commonly known as:  ZANTAC      TAKE these medications        ALPRAZolam 0.25 MG tablet  Commonly known as:  Duanne Moron  Take 0.25 mg by mouth as needed.     amitriptyline 50 MG tablet  Commonly known as:  ELAVIL  Take 50 mg by mouth at bedtime.     atorvastatin 20 MG tablet  Commonly known as:  LIPITOR  Take 1 tablet (20 mg total) by mouth daily.     calcium-vitamin D 250-125 MG-UNIT per tablet  Commonly known as:  OSCAL  Take 1 tablet by mouth daily.     cycloSPORINE 0.05 % ophthalmic emulsion  Commonly known as:  RESTASIS  Apply 1 drop to eye 2 (two) times daily.     digoxin 0.125 MG tablet  Commonly known as:  LANOXIN  Take 2 tablets (0.25 mg total) by mouth daily.     fenofibrate 50 MG tablet  Commonly known as:  TRIGLIDE  Take 50 mg by mouth daily.     latanoprost 0.005 % ophthalmic solution  Commonly known as:  XALATAN  1 drop at bedtime.     levothyroxine 75 MCG tablet  Commonly known as:  SYNTHROID, LEVOTHROID  Take 75 mcg by mouth daily.     LUMIGAN 0.01 % Soln  Generic drug:  bimatoprost  Apply to eye at bedtime.     metoprolol succinate 25 MG 24 hr tablet  Commonly known as:  TOPROL-XL  Take 1 tablet (25 mg total) by mouth daily.     omeprazole 20 MG capsule  Commonly known as:  PRILOSEC  Take 20 mg by mouth daily.     rivaroxaban 20 MG Tabs tablet  Commonly known as:  XARELTO  Take 1 tablet (20 mg total) by mouth daily.  Start taking on:  05/16/2015     [facility] ALLERGY OP  Apply 1 drop to eye daily.     TYLENOL ARTHRITIS PAIN PO  Take by mouth as needed.     vitamin C 100 MG tablet  Take 100 mg by mouth daily.       Follow-up Information    Follow up with Peter Martinique, MD On 05/22/2015.    Specialty:  Cardiology   Why:  at 2:30 PM with Melina Copa, PA-C    Contact information:   [address] [place] [address] [phone]       Follow up with Peter Martinique, MD.   Specialty:  Cardiology   Why:  the office will call with that appt date and time.   Contact information:   [place] [address] [phone]        Discharge Instructions: Call [facility] at [phone] if any bleeding, swelling or drainage at cath site.  May shower, no tub baths for 48 hours for groin sticks. No lifting over 5 pounds for 3 days.  No Driving for 3 days.    Have lab work done on Monday at Dr. Doug Sou office on 1 st floor to check your potassium.  Stay off lasix.  If you have swelling and feel you need lasix then call the office to adjust your meds.  We will have you begin Xarelto for the atrial fib to prevent strokes.  Begin on 05/16/15.  We gave you a 30 day free card and will discuss more when you are seen in the office.  Heart healthy diet.     Signed: Isaiah Serge Nurse Practitioner-Certified [facility]: [facility] 05/15/2015, 1:45 PM  Time spent on discharge : > 35 minutes.

## 2015-05-15 NOTE — Progress Notes (Signed)
ANTICOAGULATION CONSULT NOTE -  Follow up consult  Pharmacy Consult for heparin Indication: chest pain/ACS  Allergies  Allergen Reactions  . Codeine     REACTION: itching  . Erythromycin     REACTION: nausea and vomiting  . Sulfonamide Derivatives     REACTION: nausea and vomiting  . Tramadol Hcl     REACTION: itching    Patient Measurements: Height: 5\' 4"  (162.6 cm) Weight: 122 lb 9.2 oz (55.6 kg) IBW/kg (Calculated) : 54.7 Heparin Dosing Weight: 55.6 kg  Vital Signs: Temp: 97.8 F (36.6 C) (09/23 0355) Temp Source: Oral (09/23 0355) BP: 102/57 mmHg (09/23 0755) Pulse Rate: 72 (09/23 0757)  Labs:  Recent Labs  05/14/15 1249 05/14/15 1920 05/14/15 2130 05/15/15 0049 05/15/15 0423  HGB 14.6  --   --   --   --   HCT 44.5  --   --   --   --   PLT 377  --   --   --   --   LABPROT  --   --   --  15.3*  --   INR  --   --   --  1.20  --   HEPARINUNFRC  --   --  0.12*  --  0.35  CREATININE 0.98  --   --  0.93  --   TROPONINI <0.03 <0.03  --  <0.03  --     Estimated Creatinine Clearance: 52.1 mL/min (by C-G formula based on Cr of 0.93).   Medical History: Past Medical History  Diagnosis Date  . Bronchitis     recurrent  . Paroxysmal atrial fibrillation   . Renal insufficiency   . Hypothyroidism   . Glaucoma   . Hypotension     orthostatic  . Fibromyalgia   . Osteoarthritis   . Hypertension   . Vasomotor rhinitis     exacerbates VCD  . Asthma     - normal spirometry 2009 FEV1  >90% predicted.  - methacoline challenge neg 11/09  . Vocal cord dysfunction     proven on FOB 9/09  . Chronic cough     - sinus CT 03-23-10 > neg.  - allergy profile 03-23-10 > nl, IgE 14.  - flutter valve rx 03-23-10  . GERD (gastroesophageal reflux disease)     exacerbating VCD  . Hyperthyroidism     with hot nodule.  - total thyroidectomy 11-05-08 benign  . Abnormal EKG   . Anemia   . Cataract   . Heart murmur   . Fibromyalgia     Medications:  Prescriptions prior to  admission  Medication Sig Dispense Refill Last Dose  . amitriptyline (ELAVIL) 50 MG tablet Take 50 mg by mouth at bedtime.     05/13/2015 at Unknown time  . digoxin (LANOXIN) 0.125 MG tablet Take 1 tablet (0.125 mg total) by mouth daily. (Patient taking differently: Take 0.25 mg by mouth daily. ) 90 tablet 3 05/14/2015 at Unknown time  . furosemide (LASIX) 20 MG tablet Take 20 mg by mouth daily.     05/14/2015 at Unknown time  . Ketotifen Fumarate (THERATEARS ALLERGY OP) Apply 1 drop to eye daily.   Past Week at Unknown time  . latanoprost (XALATAN) 0.005 % ophthalmic solution 1 drop at bedtime.   05/13/2015 at Unknown time  . levothyroxine (SYNTHROID, LEVOTHROID) 75 MCG tablet Take 75 mcg by mouth daily.     05/14/2015 at Unknown time  . metoprolol succinate (TOPROL-XL) 25 MG 24 hr  tablet Take 1 tablet (25 mg total) by mouth daily. 90 tablet 0 05/14/2015 at 0800  . omeprazole (PRILOSEC) 20 MG capsule Take 20 mg by mouth daily.   05/14/2015 at Unknown time  . Acetaminophen (TYLENOL ARTHRITIS PAIN PO) Take by mouth as needed.     Taking  . ALPRAZolam (XANAX) 0.25 MG tablet Take 0.25 mg by mouth as needed.     Taking  . Ascorbic Acid (VITAMIN C) 100 MG tablet Take 100 mg by mouth daily.   Taking  . Bimatoprost (LUMIGAN) 0.01 % SOLN Apply to eye at bedtime.    Taking  . calcium-vitamin D (OSCAL) 250-125 MG-UNIT per tablet Take 1 tablet by mouth daily.   Taking  . cycloSPORINE (RESTASIS) 0.05 % ophthalmic emulsion Apply 1 drop to eye 2 (two) times daily.   Taking  . dexlansoprazole (DEXILANT) 60 MG capsule Take 1 capsule (60 mg total) by mouth daily. 30 capsule 6 Taking  . fenofibrate (TRIGLIDE) 50 MG tablet Take 50 mg by mouth daily.   Taking  . ibuprofen (ADVIL,MOTRIN) 800 MG tablet As needed   Taking  . ranitidine (ZANTAC) 300 MG tablet Take 300 mg by mouth at bedtime.     Taking    Assessment: 64 yo female admitted yesterday with chest pain.Troponin  negative x2. Pt was not on any anticoagulation  PTA. CBC is stable.  Heparin gtt started for ACS.  This morning's heparin level is therapeutic. No bleeding noted.     Goal of Therapy:  Heparin level 0.3-0.7 units/ml Monitor platelets by anticoagulation protocol: Yes   Plan:  Continue heparin drip at 850 units/hr Daily HL, CBC Monitor s/sx bleeding Cath today    Hughes Better, PharmD, BCPS Clinical Pharmacist Pager: [phone] 05/15/2015 8:15 AM

## 2015-05-15 NOTE — Progress Notes (Signed)
Patient Name: Kaitlyn Adams Date of Encounter: 05/15/2015  Active Problems:   Unstable angina   Length of Stay: 1  SUBJECTIVE  Minimal coronary atherosclerosis by cath. Feels well now. No access site complications, still has TR band.  CURRENT MEDS . atorvastatin  80 mg Oral q1800  . digoxin  0.25 mg Oral Daily  . levothyroxine  75 mcg Oral QAC breakfast  . metoprolol succinate  25 mg Oral Daily  . pantoprazole  40 mg Oral Daily  . sodium chloride  3 mL Intravenous Q12H    OBJECTIVE   Intake/Output Summary (Last 24 hours) at 05/15/15 1045 Last data filed at 05/15/15 0602  Gross per 24 hour  Intake 287.98 ml  Output      0 ml  Net 287.98 ml   Filed Weights   05/14/15 1240 05/14/15 1841 05/15/15 0355  Weight: 120 lb (54.432 kg) 122 lb 9.6 oz (55.611 kg) 122 lb 9.2 oz (55.6 kg)    PHYSICAL EXAM Filed Vitals:   05/15/15 0957 05/15/15 1002 05/15/15 1016 05/15/15 1031  BP: 90/53  105/58 115/56  Pulse: 70 0 60   Temp:      TempSrc:      Resp: 12 7  20   Height:      Weight:      SpO2: 100% 100% 100%    General: Alert, oriented x3, no distress Head: no evidence of trauma, PERRL, EOMI, no exophtalmos or lid lag, no myxedema, no xanthelasma; normal ears, nose and oropharynx Neck: normal jugular venous pulsations and no hepatojugular reflux; brisk carotid pulses without delay and no carotid bruits Chest: clear to auscultation, no signs of consolidation by percussion or palpation, normal fremitus, symmetrical and full respiratory excursions Cardiovascular: normal position and quality of the apical impulse, regular rhythm, normal first and second heart sounds, no rubs or gallops, no murmur Abdomen: no tenderness or distention, no masses by palpation, no abnormal pulsatility or arterial bruits, normal bowel sounds, no hepatosplenomegaly Extremities: no clubbing, cyanosis or edema; 2+ radial, ulnar and brachial pulses bilaterally; 2+ right femoral, posterior tibial and  dorsalis pedis pulses; 2+ left femoral, posterior tibial and dorsalis pedis pulses; no subclavian or femoral bruits Neurological: grossly nonfocal  LABS  CBC  Recent Labs  05/14/15 1249  WBC 11.5*  HGB 14.6  HCT 44.5  MCV 78.9  PLT 001   Basic Metabolic Panel  Recent Labs  05/14/15 1249 05/15/15 0049 05/15/15 0640  NA 138 134*  --   K 2.5* 3.0* 3.5  CL 98* 101  --   CO2 28 25  --   GLUCOSE 102* 104*  --   BUN 10 10  --   CREATININE 0.98 0.93  --   CALCIUM 9.5 8.2*  --   MG 1.7  --   --    Liver Function Tests No results for input(s): AST, ALT, ALKPHOS, BILITOT, PROT, ALBUMIN in the last 72 hours. No results for input(s): LIPASE, AMYLASE in the last 72 hours. Cardiac Enzymes  Recent Labs  05/14/15 1920 05/15/15 0049 05/15/15 0640  TROPONINI <0.03 <0.03 <0.03   BNP Invalid input(s): POCBNP D-Dimer No results for input(s): DDIMER in the last 72 hours. Hemoglobin A1C No results for input(s): HGBA1C in the last 72 hours. Fasting Lipid Panel  Recent Labs  05/15/15 0049  CHOL 206*  HDL 31*  LDLCALC 140*  TRIG 173*  CHOLHDL 6.6   Thyroid Function Tests No results for input(s): TSH, T4TOTAL, T3FREE, THYROIDAB in the last  72 hours.  Invalid input(s): [place]  Radiology Studies Imaging results have been reviewed and Dg Chest 2 View  05/14/2015   CLINICAL DATA:  Chest and left arm pain. Irregular heartbeat. Symptoms today.  EXAM: CHEST  2 VIEW  COMPARISON:  PA and lateral chest 09/28/2010.  FINDINGS: The lungs are clear. Heart size is normal. No pneumothorax or pleural effusion. Thoracic spondylosis is noted.  IMPRESSION: No acute disease.   Electronically Signed   By: Inge Rise M.D.   On: 05/14/2015 13:45    TELE NSR  ECHO Quick bedside review shows a normal study: normal LV size and EF and wall motion, normal diastolic function, non-dilated LA, normal valves  ASSESSMENT AND PLAN  Suspect symptoms are non-cardiac. Will place 30 day event  monitor to see if there is any connection between arrhythmia and symptoms, then f/u with Dr. Martinique. CHADSVasc score is 3, start Xarelto tomorrow. K level is now normal. Hypokalemia may be very problematic with her digoxin. I would recommend she stop taking furosemide. DC after TR band is off. I have called to see if she can pick up monitor on her way home.   Sanda Klein, MD, [facility] [phone] office [phone] pager 05/15/2015 10:45 AM

## 2015-05-15 NOTE — Progress Notes (Signed)
UR Completed Haron Beilke Graves-Bigelow, RN,BSN [phone]  

## 2015-05-18 ENCOUNTER — Telehealth: Payer: Self-pay | Admitting: Cardiology

## 2015-05-18 LAB — HEMOGLOBIN A1C
Hgb A1c MFr Bld: 5.6 % (ref 4.8–5.6)
Mean Plasma Glucose: 114 mg/dL

## 2015-05-18 NOTE — Telephone Encounter (Signed)
TOC Phone Call.Marland Kitchen Appt is 05/22/15 w/ Sharrell Ku at 2:30pm at the [facility]

## 2015-05-18 NOTE — Telephone Encounter (Signed)
TOC call to patient.She stated she is weak,no energy.Stated heart still out of rhythm,pulse 79 at present.Stated she understands discharge instructions and is taking medication as prescribed.Stated she has appointment to have event monitor 9/27 at 11:00 am at [facility] office.Post hospital appt with Rosaria Ferries PA 9/30 at 2:30 pm at [facility] office.Stated she was suppose to go to a wedding in [place] 9/29 but has decided not to go.

## 2015-05-19 ENCOUNTER — Ambulatory Visit (INDEPENDENT_AMBULATORY_CARE_PROVIDER_SITE_OTHER): Payer: BLUE CROSS/BLUE SHIELD

## 2015-05-19 ENCOUNTER — Other Ambulatory Visit: Payer: Self-pay

## 2015-05-19 DIAGNOSIS — E876 Hypokalemia: Secondary | ICD-10-CM

## 2015-05-19 DIAGNOSIS — R000 Tachycardia, unspecified: Secondary | ICD-10-CM | POA: Diagnosis not present

## 2015-05-19 DIAGNOSIS — I480 Paroxysmal atrial fibrillation: Secondary | ICD-10-CM

## 2015-05-19 LAB — DIGOXIN LEVEL: DIGOXIN LVL: 1.1 ug/L (ref 0.8–2.0)

## 2015-05-19 LAB — BASIC METABOLIC PANEL WITH GFR
BUN: 9 mg/dL (ref 7–25)
CHLORIDE: 105 mmol/L (ref 98–110)
CO2: 29 mmol/L (ref 20–31)
Calcium: 10.5 mg/dL — ABNORMAL HIGH (ref 8.6–10.4)
Creat: 0.76 mg/dL (ref 0.50–0.99)
GFR, Est African American: >89 mL/min (ref >=60)
GFR, Est Non African American: 83 mL/min (ref >=60)
Glucose, Bld: 85 mg/dL (ref 65–99)
POTASSIUM: 3.6 mmol/L (ref 3.5–5.3)
SODIUM: 141 mmol/L (ref 135–146)

## 2015-05-19 NOTE — Telephone Encounter (Signed)
Can this encounter be closed?

## 2015-05-21 ENCOUNTER — Encounter: Payer: Self-pay | Admitting: Physician Assistant

## 2015-05-21 DIAGNOSIS — I2510 Atherosclerotic heart disease of native coronary artery without angina pectoris: Secondary | ICD-10-CM | POA: Insufficient documentation

## 2015-05-21 DIAGNOSIS — E876 Hypokalemia: Secondary | ICD-10-CM | POA: Insufficient documentation

## 2015-05-21 NOTE — Progress Notes (Addendum)
Cardiology Office Note Date:  05/22/2015  Patient ID:  Kaitlyn Adams, Kaitlyn Adams 1951-07-30, MRN [id number] PCP:  Florina Ou, MD  Cardiologist:  Dr. Martinique   Chief Complaint: f/u hospitalization for chest pain  History of Present Illness: Kaitlyn Adams is a 64 y.o. female with history of paroxysmal atrial fibrillation, HTN, HLD, hyperthyroidism s/p thyroidectomy with subsequent hypothyroidism, asthma, GERD, fibromyalgia, recently diagnosed mild nonobstructive CAD who presents for post-hospital follow-up. Per history she has a h/o PAF, on rate control therapy with metoprolol and digoxin but not previously on anticoagulation before recent admission.  She was recently admitted 9/22-9/23 with complaints of chest discomfort as well as symptoms of recurrent atrial fibrillation including dyspnea, weakness/fatigue, and palpitations with near-syncope. Labs were notable for significant hypokalemia of 2.5 which was repleted. She was in in NSR but there was concern that recent symptoms represented breakthrough atrial fib. Troponins were negative. Symptoms were felt concerning for [place] thus she underwent cath showing mild nonobstructive CAD with 30% dLAD, 40% D1, 25% pLCx, 30% mRCA, 30% RPDA, normal EF >65% with normal LVEDP. Her symptoms were felt possibly due to recurrent atrial fib as outpatient. Lasix was stopped due to her hypokalemia. Labs otherwise notable for LDL 140. She had a 30-day event monitor placed to evaluate for atrial fib and she was started on Xarelto for CHADSVASC of 3-4 (HTN, age, female, mild vascular disease). She was also started on atorvastatin. 2D echo 05/15/15: EF 79-89%, normal diastolic function. F/u labs 9/26 showed digoxin level 1.1 (digoxin decreased to 0.12m daily) and repeat BMET showed K of 3.6, calcium 10.5.   She comes in to clinic today for follow-up. She is wearing her event monitor until end of October. She has not had any recurrent palpitations but still feels quite  fatigued. She's gained about 6lbs since discharge (weight now similar to 2014). She denies any further CP or SOB. No bleeding issues.   Past Medical History  Diagnosis Date  . Bronchitis     recurrent  . Paroxysmal atrial fibrillation   . Renal insufficiency   . Hypothyroidism   . Glaucoma   . Orthostatic hypotension   . Fibromyalgia   . Osteoarthritis   . Hypertension   . Vasomotor rhinitis     exacerbates VCD  . Asthma     - normal spirometry 2009 FEV1  >90% predicted.  - methacoline challenge neg 11/09  . Vocal cord dysfunction     proven on FOB 9/09  . Chronic cough     - sinus CT 03-23-10 > neg.  - allergy profile 03-23-10 > nl, IgE 14.  - flutter valve rx 03-23-10  . GERD (gastroesophageal reflux disease)     exacerbating VCD  . Hyperthyroidism     with hot nodule.  - total thyroidectomy 11-05-08 benign -> subsequent hypothyroidism.  .Marland KitchenAnemia   . Cataract   . Hyperlipidemia   . Hypothyroidism     a. following thyroidectomy.  . Mild CAD     a. LHC 04/2015 - mild nonobstructive CAD with 30% dLAD, 40% D1, 25% pLCx, 30% mRCA, 30% RPDA, normal EF >65% with normal LVEDP.  .Marland KitchenHypokalemia     Past Surgical History  Procedure Laterality Date  . Abdominal hysterectomy  1980  . Scar tissue removal  1982  . Thyroidectomy  11-05-08  . Basal cell carcinoma excision  11/08  . Eye surgery    . Abdominal hysterectomy    . Thyroid surgery  2010  . Cardiac  catheterization N/A 05/15/2015    Procedure: Left Heart Cath and Coronary Angiography;  Surgeon: Sherren Mocha, MD;  Location: [facility];  Service: Cardiovascular;  Laterality: N/A;    Current Outpatient Prescriptions  Medication Sig Dispense Refill  . Acetaminophen (TYLENOL ARTHRITIS PAIN PO) Take 325 mg by mouth as needed (FOR MILD PAIN).     Marland Kitchen amitriptyline (ELAVIL) 50 MG tablet Take 50 mg by mouth at bedtime.      Marland Kitchen atorvastatin (LIPITOR) 20 MG tablet Take 1 tablet (20 mg total) by mouth daily. 30 tablet 6  . digoxin  (LANOXIN) 0.125 MG tablet Take 1 tablet (0.125 mg total) by mouth daily. 30 tablet 6  . Ketotifen Fumarate (THERATEARS ALLERGY OP) Apply 1 drop to eye daily. EACH EYE    . latanoprost (XALATAN) 0.005 % ophthalmic solution Place 1 drop into both eyes at bedtime.     Marland Kitchen levothyroxine (SYNTHROID, LEVOTHROID) 75 MCG tablet Take 75 mcg by mouth daily.      . metoprolol succinate (TOPROL-XL) 25 MG 24 hr tablet Take 1 tablet (25 mg total) by mouth daily. 90 tablet 0  . omeprazole (PRILOSEC) 20 MG capsule Take 20 mg by mouth daily.    . rivaroxaban (XARELTO) 20 MG TABS tablet Take 1 tablet (20 mg total) by mouth daily. 30 tablet 0  . ALPRAZolam (XANAX) 0.25 MG tablet Take 0.25 mg by mouth as needed for anxiety or sleep.     . Ascorbic Acid (VITAMIN C) 100 MG tablet Take 100 mg by mouth daily.    . calcium-vitamin D (OSCAL) 250-125 MG-UNIT per tablet Take 1 tablet by mouth daily.     No current facility-administered medications for this visit.    Allergies:   Codeine; Erythromycin; Hydrocod polst-cpm polst er; Sulfonamide derivatives; Tramadol hcl; and Guaifenesin er   Social History:  The patient  reports that she has never smoked. She has never used smokeless tobacco. She reports that she drinks about 0.6 oz of alcohol per week. She reports that she does not use illicit drugs.   Family History:  The patient's family history includes Asthma in her grandchild and mother; Emphysema in her father; Heart disease in her father and mother.  ROS:  Please see the history of present illness.  All other systems are reviewed and otherwise negative.   PHYSICAL EXAM:  VS:  BP 146/78 mmHg  Pulse 76  Ht _0  (1.626 m)  Wt 129 lb 9.6 oz (58.786 kg)  BMI 22.23 kg/m2 BMI: Body mass index is 22.23 kg/(m^2). Well nourished, well developed WF, in no acute distress HEENT: normocephalic, atraumatic Neck: no JVD, carotid bruits or masses Cardiac:  normal S1, S2; RRR; very soft SEM (noted on previous exams as  well) Lungs:  clear to auscultation bilaterally, no wheezing, rhonchi or rales Abd: soft, nontender, no hepatomegaly, + BS MS: no deformity or atrophy Ext: no edema, right radial cath site with minimal resolving ecchymosis; no hematoma, good pulse. Skin: warm and dry, no rash Neuro:  moves all extremities spontaneously, no focal abnormalities noted, follows commands Psych: euthymic mood, full affect   EKG:  Done today shows NSR with inferior ST-T wave abnormality as well as V1, V4-V6 as well - similar to prior tracing.  Recent Labs: 05/14/2015: Hemoglobin 14.6; Magnesium 1.7; Platelets 377 05/18/2015: BUN 9; Creat 0.76; Potassium 3.6; Sodium 141  05/15/2015: Cholesterol 206*; HDL 31*; LDL Cholesterol 140*; Total CHOL/HDL Ratio 6.6; Triglycerides 173*; VLDL 35   Estimated Creatinine Clearance: 60.5  mL/min (by C-G formula based on Cr of 0.76).   Wt Readings from Last 3 Encounters:  05/22/15 129 lb 9.6 oz (58.786 kg)  05/15/15 122 lb 9.2 oz (55.6 kg)  10/24/12 128 lb 6.4 oz (58.242 kg)     Other studies reviewed: Additional studies/records reviewed today include: summarized above  ASSESSMENT AND PLAN:  1. Ongoing fatigue - etiology not totally clear. Await results of event monitor. She reports sensation of sluggishness and weight gain. She has not had any recent testing of thyroid function. Will recheck TSH. Will also draw ionized calcium with this since recent Ca++ was borderline elevated, along with Mg (borderline low recently) and BMET (to trend K given possible re-initiation of Lasix as below). If above workup unrevealing she should f/u with primary care to investigate for other causes of her fatigue. 2. Essential HTN - running slightly elevated ever since discontinuation of Lasix. She requests to go back on Lasix. We need to make sure her lytes are stable before we clear her to do so, given recent severe hypokalemia. Recheck BMET and Mg today. If stable, I will likely add low dose Lasix  back on with prophylactic electrolyte repletion. Note recent EF normal and no evidence of diastolic dysfunction. 3. H/o paroxysmal atrial fibrillation - continue event monitor. I told her Xarelto is typically taken qsupper and she plans to start taking it this way. 4. Recent hypokalemia - see above. 5. Post-op hypothyroidism - needs updated TSH given above symptoms. 6. Hyperlipidemia - atorvastatin recently initiated. Recheck LFTs/lipids when she returns for her f/u appt. 7. Mild CAD - continue risk reduction. Cath site OK.  Disposition: F/u with Dr. Martinique in 2 months, sooner if needed. She is also in the process of changing primary care physicians and is working to set this up.  Current medicines are reviewed at length with the patient today.  The patient did not have any concerns regarding medicines. I also signed a form for her travel insurance excusing her for her trip that she had scheduled this past week. I did not charge a TOC because I did not think she met criteria - not a recent high risk hospitalization with either NSTEMI, STEMI or CHF.  Raechel Ache PA-C 05/22/2015 2:31 PM     [facility] [address], [place], [place] [address] Phone: [phone]

## 2015-05-22 ENCOUNTER — Ambulatory Visit (INDEPENDENT_AMBULATORY_CARE_PROVIDER_SITE_OTHER): Payer: BLUE CROSS/BLUE SHIELD | Admitting: Physician Assistant

## 2015-05-22 ENCOUNTER — Encounter: Payer: Self-pay | Admitting: Physician Assistant

## 2015-05-22 VITALS — BP 146/78 | HR 76 | Ht 64.0 in | Wt 129.6 lb

## 2015-05-22 DIAGNOSIS — E890 Postprocedural hypothyroidism: Secondary | ICD-10-CM | POA: Diagnosis not present

## 2015-05-22 DIAGNOSIS — E876 Hypokalemia: Secondary | ICD-10-CM | POA: Diagnosis not present

## 2015-05-22 DIAGNOSIS — E785 Hyperlipidemia, unspecified: Secondary | ICD-10-CM

## 2015-05-22 DIAGNOSIS — I480 Paroxysmal atrial fibrillation: Secondary | ICD-10-CM | POA: Diagnosis not present

## 2015-05-22 DIAGNOSIS — I200 Unstable angina: Secondary | ICD-10-CM

## 2015-05-22 DIAGNOSIS — I2510 Atherosclerotic heart disease of native coronary artery without angina pectoris: Secondary | ICD-10-CM

## 2015-05-22 DIAGNOSIS — I10 Essential (primary) hypertension: Secondary | ICD-10-CM

## 2015-05-22 NOTE — Patient Instructions (Signed)
Your physician recommends that you return for lab work TODAY  Your physician recommends that you return for lab work 2 months fasting  Your physician recommends that you schedule a follow-up appointment in 2 months with Dr. Martinique  Please take Alveda Reasons with supper

## 2015-05-23 LAB — MAGNESIUM: Magnesium: 1.7 mg/dL (ref 1.5–2.5)

## 2015-05-23 LAB — BASIC METABOLIC PANEL
BUN: 9 mg/dL (ref 7–25)
CALCIUM: 9.2 mg/dL (ref 8.6–10.4)
CO2: 28 mmol/L (ref 20–31)
CREATININE: 0.77 mg/dL (ref 0.50–0.99)
Chloride: 104 mmol/L (ref 98–110)
GLUCOSE: 83 mg/dL (ref 65–99)
POTASSIUM: 3.2 mmol/L — AB (ref 3.5–5.3)
Sodium: 142 mmol/L (ref 135–146)

## 2015-05-23 LAB — TSH: TSH: 0.554 u[IU]/mL (ref 0.350–4.500)

## 2015-05-23 LAB — CALCIUM, IONIZED: CALCIUM ION: 1.32 mmol/L (ref 1.12–1.32)

## 2015-05-25 ENCOUNTER — Telehealth: Payer: Self-pay

## 2015-05-25 DIAGNOSIS — E876 Hypokalemia: Secondary | ICD-10-CM

## 2015-05-25 MED ORDER — POTASSIUM CHLORIDE CRYS ER 20 MEQ PO TBCR: 40.0000 meq | EXTENDED_RELEASE_TABLET | Freq: Every day | ORAL | Status: DC

## 2015-05-25 MED ORDER — SPIRONOLACTONE 25 MG PO TABS: 12.5000 mg | ORAL_TABLET | Freq: Every day | ORAL | Status: DC

## 2015-05-25 MED ORDER — MAGNESIUM OXIDE 400 MG PO TABS: 400.0000 mg | ORAL_TABLET | Freq: Every day | ORAL | Status: DC

## 2015-05-25 NOTE — Telephone Encounter (Signed)
Called patient about her lab results. Per Melina Copa PA, labs do reveal continued low potassium level and borderline magnesium level - it's not clear if this is causing her fatigue but she should f/u with PCP to further evaluate for causes of ongoing low potassium. I do not think it's a good idea for her to resume Lasix at this time. It could cause her potassium to go even lower. She can instead try spironolactone, which is still a diuretic but helps to retain potassium. Can start spironolactone 12.5mg  daily, start potassium chloride 40 meq today and repeat in 6 hours, then start 40 meq daily tomorrow. Also start Magox 400 mg daily. Patient is coming in on Monday for BMET and Mg. Encouraged patient to increase potassium rich foods in her diet. Patient verbalized understanding and stated she would call if her BP is running greater than 140/90.

## 2015-05-28 ENCOUNTER — Telehealth: Payer: Self-pay | Admitting: Cardiology

## 2015-05-28 NOTE — Telephone Encounter (Signed)
Discussed troubleshooting options for monitor over the phone. She had this set up at [facility]. She has been advised to contact the company to see if they can help her diagnose a connectivity issue she is having with the monitor. Pt voiced understanding of instruction.

## 2015-05-28 NOTE — Telephone Encounter (Signed)
Kaitlyn Adams is calling because she is having a problem with the monitor she is wearing , Please call   Thanks

## 2015-05-30 LAB — HM DEXA SCAN

## 2015-06-01 ENCOUNTER — Other Ambulatory Visit: Payer: BLUE CROSS/BLUE SHIELD | Admitting: *Deleted

## 2015-06-01 DIAGNOSIS — E876 Hypokalemia: Secondary | ICD-10-CM

## 2015-06-01 LAB — MAGNESIUM: MAGNESIUM: 1.9 mg/dL (ref 1.5–2.5)

## 2015-06-01 LAB — BASIC METABOLIC PANEL
BUN: 13 mg/dL (ref 7–25)
CALCIUM: 10.6 mg/dL — AB (ref 8.6–10.4)
CO2: 24 mmol/L (ref 20–31)
Chloride: 102 mmol/L (ref 98–110)
Creat: 0.84 mg/dL (ref 0.50–0.99)
Glucose, Bld: 97 mg/dL (ref 65–99)
Potassium: 4.6 mmol/L (ref 3.5–5.3)
Sodium: 136 mmol/L (ref 135–146)

## 2015-06-04 ENCOUNTER — Telehealth: Payer: Self-pay | Admitting: Family Medicine

## 2015-06-04 NOTE — Telephone Encounter (Signed)
Please advise, pt does not have any family that sees you

## 2015-06-04 NOTE — Telephone Encounter (Signed)
Pt calling interested in seeing Dr. Birdie Riddle at the new practice in [place]. She said that her PCP office closed and she is needing to establish with a new doctor. She is currently a LBPulmonary pt as well. Please advise.

## 2015-06-26 NOTE — Telephone Encounter (Signed)
Ok to establish 

## 2015-06-26 NOTE — Telephone Encounter (Signed)
Pt calling to f/u on request to have Dr. Birdie Riddle as PCP. Pt is ok with going to [place] as it is closer to her.   [phone]

## 2015-06-30 NOTE — Telephone Encounter (Signed)
Left msg for pt to call and schedule new pt appt with Dr. Birdie Riddle.

## 2015-07-30 ENCOUNTER — Ambulatory Visit (INDEPENDENT_AMBULATORY_CARE_PROVIDER_SITE_OTHER): Payer: BLUE CROSS/BLUE SHIELD | Admitting: Family Medicine

## 2015-07-30 ENCOUNTER — Encounter: Payer: Self-pay | Admitting: Family Medicine

## 2015-07-30 VITALS — BP 130/80 | HR 74 | Temp 98.0°F | Resp 16 | Ht 64.0 in | Wt 124.4 lb

## 2015-07-30 DIAGNOSIS — I480 Paroxysmal atrial fibrillation: Secondary | ICD-10-CM

## 2015-07-30 DIAGNOSIS — I200 Unstable angina: Secondary | ICD-10-CM

## 2015-07-30 DIAGNOSIS — E785 Hyperlipidemia, unspecified: Secondary | ICD-10-CM | POA: Diagnosis not present

## 2015-07-30 DIAGNOSIS — E890 Postprocedural hypothyroidism: Secondary | ICD-10-CM

## 2015-07-30 DIAGNOSIS — I10 Essential (primary) hypertension: Secondary | ICD-10-CM | POA: Diagnosis not present

## 2015-07-30 DIAGNOSIS — J0100 Acute maxillary sinusitis, unspecified: Secondary | ICD-10-CM | POA: Insufficient documentation

## 2015-07-30 DIAGNOSIS — Z1231 Encounter for screening mammogram for malignant neoplasm of breast: Secondary | ICD-10-CM

## 2015-07-30 MED ORDER — AMOXICILLIN 875 MG PO TABS: 875.0000 mg | ORAL_TABLET | Freq: Two times a day (BID) | ORAL | Status: DC

## 2015-07-30 NOTE — Patient Instructions (Signed)
Schedule your complete physical in March Start the Amoxicillin twice daily for your sinus infection Drink plenty of fluids REST! We'll call you with your mammogram appt at [facility] (on [facility]) Please complete the Cologuard as directed (it will be mailed to you) Call with any questions or concerns If you want to join [place] at the new [place] office, any scheduled appointments will automatically transfer and we will see you at [address] [place] [address] [place],  [phone] (OPENING 08/25/15) Welcome!  We're glad to have you! Happy Holidays!!!

## 2015-07-30 NOTE — Progress Notes (Signed)
   Subjective:    Patient ID: Kaitlyn Adams, female    DOB: [birth year], 64 y.o.   MRN: [id number]  HPI New to establish.  Previous MD- Greta Doom.  Providers: Endo- Dr Chalmers Cater, following thyroid Pulm- Dr Joya Gaskins, chronic bronchitis Cards- Dr Martinique, Afib and CAD  Health Maintenance: Colonoscopy- pt declines, open to Cologuard Mammo- due for mammo [facility]) Declines flu shot Prevnar today  HTN- chronic problem, on Metoprolol, Spironolactone w/ good control today.  No recent CP, SOB, HAs, edema.  Hyperlipidemia- pt was started on Lipitor during Cardiac hospitalization in Sept.  LDL at the time was 140.  No abd pain, N/V.  Chronic myalgias  Hypothyroid- chronic problem due to thyroidectomy in 2010.  Following w/ Dr Chalmers Cater.  URI- sxs started Sunday.  + sick contacts.  + HA, sore throat, sinus pressure.  R ear pain.  No tooth pain.  Mild nausea.  No vomiting.  No fevers.   Review of Systems For ROS see HPI     Objective:   Physical Exam  Constitutional: She is oriented to person, place, and time. She appears well-developed and well-nourished. No distress.  HENT:  Head: Normocephalic and atraumatic.  Right Ear: Tympanic membrane normal.  Left Ear: Tympanic membrane normal.  Nose: Mucosal edema and rhinorrhea present. Right sinus exhibits maxillary sinus tenderness and frontal sinus tenderness. Left sinus exhibits maxillary sinus tenderness and frontal sinus tenderness.  Mouth/Throat: Uvula is midline and mucous membranes are normal. Posterior oropharyngeal erythema present. No oropharyngeal exudate.  Eyes: Conjunctivae and EOM are normal. Pupils are equal, round, and reactive to light.  Neck: Normal range of motion. Neck supple.  Cardiovascular: Normal rate, regular rhythm and normal heart sounds.   Pulmonary/Chest: Effort normal and breath sounds normal. No respiratory distress. She has no wheezes.  Abdominal: Soft. Bowel sounds are normal. She exhibits no distension. There is no  tenderness. There is no rebound.  Lymphadenopathy:    She has no cervical adenopathy.  Neurological: She is alert and oriented to person, place, and time.  Skin: Skin is warm and dry.  Psychiatric: She has a normal mood and affect. Her behavior is normal. Thought content normal.  Vitals reviewed.         Assessment & Plan:

## 2015-07-30 NOTE — Progress Notes (Signed)
Pre visit review using our clinic review tool, if applicable. No additional management support is needed unless otherwise documented below in the visit note. 

## 2015-08-02 NOTE — Assessment & Plan Note (Signed)
New to provider, ongoing for pt.  Well controlled.  Asymptomatic.  Reviewed recent labs.  No need to change meds.  Will continue to follow.

## 2015-08-02 NOTE — Assessment & Plan Note (Signed)
New to provider, ongoing for pt.  Following w/ Dr Chalmers Cater.

## 2015-08-02 NOTE — Assessment & Plan Note (Signed)
New to provider, ongoing for pt.  Following w/ Cards.  On Dig.  Xarelto for anti-coag.  Currently in sinus rhythm.  Asymptomatic.  Will follow and assist as able.

## 2015-08-02 NOTE — Assessment & Plan Note (Signed)
New to provider, ongoing for pt.  Tolerating statin w/o difficulty.  Reviewed recent labs.  No med changes at this time but will continue to follow.  Also stressed need for healthy diet and regular exercise.

## 2015-09-02 DIAGNOSIS — Z1231 Encounter for screening mammogram for malignant neoplasm of breast: Secondary | ICD-10-CM | POA: Diagnosis not present

## 2015-09-02 DIAGNOSIS — Z1211 Encounter for screening for malignant neoplasm of colon: Secondary | ICD-10-CM | POA: Diagnosis not present

## 2015-09-02 DIAGNOSIS — Z1212 Encounter for screening for malignant neoplasm of rectum: Secondary | ICD-10-CM | POA: Diagnosis not present

## 2015-09-02 LAB — HM MAMMOGRAPHY

## 2015-09-04 DIAGNOSIS — E785 Hyperlipidemia, unspecified: Secondary | ICD-10-CM | POA: Diagnosis not present

## 2015-09-04 LAB — LIPID PANEL
CHOL/HDL RATIO: 4 ratio (ref <=5.0)
CHOLESTEROL: 172 mg/dL (ref 125–200)
HDL: 43 mg/dL — ABNORMAL LOW (ref >=46)
LDL Cholesterol: 112 mg/dL (ref <130)
TRIGLYCERIDES: 83 mg/dL (ref <150)
VLDL: 17 mg/dL (ref <30)

## 2015-09-04 LAB — HEPATIC FUNCTION PANEL
ALBUMIN: 4.6 g/dL (ref 3.6–5.1)
ALT: 25 U/L (ref 6–29)
AST: 24 U/L (ref 10–35)
Alkaline Phosphatase: 114 U/L (ref 33–130)
BILIRUBIN TOTAL: 0.5 mg/dL (ref 0.2–1.2)
Bilirubin, Direct: 0.1 mg/dL (ref <=0.2)
Indirect Bilirubin: 0.4 mg/dL (ref 0.2–1.2)
TOTAL PROTEIN: 7.2 g/dL (ref 6.1–8.1)

## 2015-09-08 ENCOUNTER — Ambulatory Visit (INDEPENDENT_AMBULATORY_CARE_PROVIDER_SITE_OTHER): Payer: Medicare Other | Admitting: Cardiology

## 2015-09-08 ENCOUNTER — Encounter: Payer: Self-pay | Admitting: Cardiology

## 2015-09-08 VITALS — BP 104/80 | HR 64 | Ht 64.0 in | Wt 126.0 lb

## 2015-09-08 DIAGNOSIS — I471 Supraventricular tachycardia: Secondary | ICD-10-CM | POA: Diagnosis not present

## 2015-09-08 DIAGNOSIS — Z1231 Encounter for screening mammogram for malignant neoplasm of breast: Secondary | ICD-10-CM | POA: Diagnosis not present

## 2015-09-08 DIAGNOSIS — I10 Essential (primary) hypertension: Secondary | ICD-10-CM | POA: Diagnosis not present

## 2015-09-08 DIAGNOSIS — I4719 Other supraventricular tachycardia: Secondary | ICD-10-CM | POA: Insufficient documentation

## 2015-09-08 DIAGNOSIS — I480 Paroxysmal atrial fibrillation: Secondary | ICD-10-CM | POA: Diagnosis not present

## 2015-09-08 NOTE — Progress Notes (Signed)
Cardiology Office Note Date:  09/08/2015  Patient ID:  Kaitlyn, Adams 1950-10-27, MRN [id number] PCP:  Annye Asa, MD  Cardiologist:  Dr. Martinique   Chief Complaint: Pafib  History of Present Illness: Kaitlyn Adams is a 65 y.o. female with history of paroxysmal atrial fibrillation, HTN, HLD, hyperthyroidism s/p thyroidectomy with subsequent hypothyroidism, asthma, GERD, fibromyalgia, recently diagnosed mild nonobstructive CAD who presents for follow-up.  She was admitted 9/22-9/23 with complaints of chest discomfort as well as symptoms of recurrent atrial fibrillation including dyspnea, weakness/fatigue, and palpitations with near-syncope. Labs were notable for significant hypokalemia of 2.5 which was repleted. She was in in NSR but there was concern that recent symptoms represented breakthrough atrial fib. Troponins were negative. Symptoms were felt concerning for [place] thus she underwent cath showing mild nonobstructive CAD with 30% dLAD, 40% D1, 25% pLCx, 30% mRCA, 30% RPDA, normal EF >65% with normal LVEDP.  Lasix was stopped due to her hypokalemia. Labs otherwise notable for LDL 140. She had a 30-day event monitor placed to evaluate for atrial fib and she was started on Xarelto for [facility] of 3-4 (HTN, age, female, mild vascular disease). She was also started on atorvastatin. 2D echo 05/15/15: EF Q000111Q, normal diastolic function. F/u labs 9/26 showed digoxin level 1.1 (digoxin decreased to 0.125mg  daily) and repeat BMET showed K of 3.6, calcium 10.5.   On follow up she reports episodes of tachycardia and fluttering at times typically when she is under stress or has over exerted herself. Her event monitor did show short bursts of PAT. No definite Afib.    Past Medical History  Diagnosis Date  . Bronchitis     recurrent  . Paroxysmal atrial fibrillation (HCC)   . Renal insufficiency   . Hypothyroidism   . Glaucoma   . Orthostatic hypotension   . Fibromyalgia   .  Osteoarthritis   . Hypertension   . Vasomotor rhinitis     exacerbates VCD  . Asthma     - normal spirometry 2009 FEV1  >90% predicted.  - methacoline challenge neg 11/09  . Vocal cord dysfunction     proven on FOB 9/09  . Chronic cough     - sinus CT 03-23-10 > neg.  - allergy profile 03-23-10 > nl, IgE 14.  - flutter valve rx 03-23-10  . GERD (gastroesophageal reflux disease)     exacerbating VCD  . Hyperthyroidism     with hot nodule.  - total thyroidectomy 11-05-08 benign -> subsequent hypothyroidism.  Marland Kitchen Anemia   . Cataract   . Hyperlipidemia   . Hypothyroidism     a. following thyroidectomy.  . Mild CAD     a. LHC 04/2015 - mild nonobstructive CAD with 30% dLAD, 40% D1, 25% pLCx, 30% mRCA, 30% RPDA, normal EF >65% with normal LVEDP.  Marland Kitchen Hypokalemia     Past Surgical History  Procedure Laterality Date  . Abdominal hysterectomy  1980  . Scar tissue removal  1982  . Thyroidectomy  11-05-08  . Basal cell carcinoma excision  11/08  . Eye surgery    . Abdominal hysterectomy    . Thyroid surgery  2010  . Cardiac catheterization N/A 05/15/2015    Procedure: Left Heart Cath and Coronary Angiography;  Surgeon: Sherren Mocha, MD;  Location: [facility];  Service: Cardiovascular;  Laterality: N/A;    Current Outpatient Prescriptions  Medication Sig Dispense Refill  . Acetaminophen (TYLENOL ARTHRITIS PAIN PO) Take 325 mg by mouth as  needed (FOR MILD PAIN).     Marland Kitchen amitriptyline (ELAVIL) 50 MG tablet Take 50 mg by mouth at bedtime.      . Ascorbic Acid (VITAMIN C) 100 MG tablet Take 100 mg by mouth daily.    Marland Kitchen atorvastatin (LIPITOR) 20 MG tablet Take 1 tablet (20 mg total) by mouth daily. 30 tablet 6  . digoxin (LANOXIN) 0.125 MG tablet Take 1 tablet (0.125 mg total) by mouth daily. 30 tablet 6  . Ketotifen Fumarate (THERATEARS ALLERGY OP) Apply 1 drop to eye daily. EACH EYE    . latanoprost (XALATAN) 0.005 % ophthalmic solution Place 1 drop into both eyes at bedtime.     Marland Kitchen  levothyroxine (SYNTHROID, LEVOTHROID) 75 MCG tablet Take 75 mcg by mouth daily.      . magnesium oxide (MAG-OX) 400 MG tablet Take 1 tablet (400 mg total) by mouth daily. 30 tablet 11  . metoprolol succinate (TOPROL-XL) 25 MG 24 hr tablet Take 1 tablet (25 mg total) by mouth daily. 90 tablet 0  . omeprazole (PRILOSEC) 20 MG capsule Take 20 mg by mouth daily.    . potassium chloride SA (K-DUR,KLOR-CON) 20 MEQ tablet Take 2 tablets (40 mEq total) by mouth daily. Start today and 6 hours later take 40 meq by mouth today only 05/25/2015. 90 tablet 3  . rivaroxaban (XARELTO) 20 MG TABS tablet Take 20 mg by mouth daily with supper.    Marland Kitchen spironolactone (ALDACTONE) 25 MG tablet Take 0.5 tablets (12.5 mg total) by mouth daily. 45 tablet 3   No current facility-administered medications for this visit.    Allergies:   Codeine; Erythromycin; Hydrocod polst-cpm polst er; Sulfonamide derivatives; Tramadol hcl; and Guaifenesin er   Social History:  The patient  reports that she has never smoked. She has never used smokeless tobacco. She reports that she drinks about 0.6 oz of alcohol per week. She reports that she does not use illicit drugs.   Family History:  The patient's family history includes Asthma in her grandchild and mother; Emphysema in her father; Heart disease in her father and mother.  ROS:  Please see the history of present illness.  All other systems are reviewed and otherwise negative.   PHYSICAL EXAM:  VS:  BP 104/80 mmHg  Pulse 64  Ht 5\' 4"  (1.626 m)  Wt 57.153 kg (126 lb)  BMI 21.62 kg/m2 BMI: Body mass index is 21.62 kg/(m^2). Well nourished, well developed WF, in no acute distress HEENT: normocephalic, atraumatic Neck: no JVD, carotid bruits or masses Cardiac:  normal S1, S2; RRR; very soft SEM (noted on previous exams as well) Lungs:  clear to auscultation bilaterally, no wheezing, rhonchi or rales Abd: soft, nontender, no hepatomegaly, + BS MS: no deformity or atrophy Ext: no  edema Skin: warm and dry, no rash Neuro:  moves all extremities spontaneously, no focal abnormalities noted, follows commands Psych: euthymic mood, full affect   EKG:  Not done today.  Recent Labs: 05/14/2015: Hemoglobin 14.6; Platelets 377 05/22/2015: TSH 0.554 06/01/2015: BUN 13; Creat 0.84; Magnesium 1.9; Potassium 4.6; Sodium 136 09/04/2015: ALT 25  09/04/2015: Cholesterol 172; HDL 43*; LDL Cholesterol 112; Total CHOL/HDL Ratio 4.0; Triglycerides 83; VLDL 17   CrCl cannot be calculated (Patient has no serum creatinine result on file.).   Wt Readings from Last 3 Encounters:  09/08/15 57.153 kg (126 lb)  07/30/15 56.416 kg (124 lb 6 oz)  05/22/15 58.786 kg (129 lb 9.6 oz)     Other studies reviewed: Additional  studies/records reviewed today include: summarized above  ASSESSMENT AND PLAN:  1. PAT with history of Pafib. On appropriate therapy with dig and metoprolol. Continue Xarelto. Avoid stressors when possible. Follow up in 4 months. 2. Essential HTN - well controlled now.  3. Recent hypokalemia - now repleted. 4. Post-op hypothyroidism - needs updated TSH given above symptoms. 5. Hyperlipidemia - atorvastatin recently initiated. Recheck LFTs/lipids when she returns for her f/u appt. 6. Mild CAD - continue risk reduction.   Disposition: F/u with Dr. Martinique in 4 months with fasting lab work and magnesium level.  Current medicines are reviewed at length with the patient today.  The patient did not have any concerns regarding medicines.   Signed, Peter Martinique MD, [facility]   09/08/2015 5:39 PM     [facility] [address] [place], [place] [address] Phone: [phone]

## 2015-09-08 NOTE — Patient Instructions (Signed)
Continue your current therapy  I will see you in 4 months with lab work   

## 2015-09-14 ENCOUNTER — Telehealth: Payer: Self-pay

## 2015-09-14 LAB — COLOGUARD

## 2015-09-14 NOTE — Telephone Encounter (Signed)
Heidi from [employer] called to report a POSITIVE Cologuard result.  Results have also been faxed.  For questions call [phone].

## 2015-09-14 NOTE — Telephone Encounter (Signed)
I believe I signed this paper result.  Pt needs GI referral for further evaluation if not already entered.  Please let her know that this result doesn't mean she has cancer, but we need to do additional evaluation.

## 2015-09-15 ENCOUNTER — Encounter: Payer: Self-pay | Admitting: General Practice

## 2015-09-15 NOTE — Telephone Encounter (Signed)
Called patient left message for call back.  

## 2015-09-17 NOTE — Telephone Encounter (Signed)
Please place referral and they will call her with an appt.  This is very important.

## 2015-09-17 NOTE — Telephone Encounter (Signed)
Pt is returning your call.    CB: [phone]

## 2015-09-17 NOTE — Telephone Encounter (Addendum)
Patient states she will think about all this first and let us know if she will have referral.

## 2015-09-18 ENCOUNTER — Other Ambulatory Visit: Payer: Self-pay | Admitting: Family Medicine

## 2015-09-18 DIAGNOSIS — Z1211 Encounter for screening for malignant neoplasm of colon: Secondary | ICD-10-CM

## 2015-09-18 NOTE — Telephone Encounter (Signed)
Order placed

## 2015-09-24 ENCOUNTER — Ambulatory Visit: Payer: BLUE CROSS/BLUE SHIELD | Admitting: Family Medicine

## 2015-10-15 ENCOUNTER — Encounter: Payer: Self-pay | Admitting: Family Medicine

## 2015-10-20 DIAGNOSIS — Z961 Presence of intraocular lens: Secondary | ICD-10-CM | POA: Diagnosis not present

## 2015-10-20 DIAGNOSIS — H40011 Open angle with borderline findings, low risk, right eye: Secondary | ICD-10-CM | POA: Diagnosis not present

## 2015-10-20 DIAGNOSIS — H40012 Open angle with borderline findings, low risk, left eye: Secondary | ICD-10-CM | POA: Diagnosis not present

## 2015-10-27 DIAGNOSIS — L820 Inflamed seborrheic keratosis: Secondary | ICD-10-CM | POA: Diagnosis not present

## 2015-10-27 DIAGNOSIS — L821 Other seborrheic keratosis: Secondary | ICD-10-CM | POA: Diagnosis not present

## 2015-11-16 ENCOUNTER — Encounter: Payer: Self-pay | Admitting: Family Medicine

## 2015-11-16 ENCOUNTER — Other Ambulatory Visit: Payer: Self-pay | Admitting: Family Medicine

## 2015-11-16 ENCOUNTER — Ambulatory Visit (INDEPENDENT_AMBULATORY_CARE_PROVIDER_SITE_OTHER): Payer: Medicare Other | Admitting: Family Medicine

## 2015-11-16 ENCOUNTER — Ambulatory Visit
Admission: RE | Admit: 2015-11-16 | Discharge: 2015-11-16 | Disposition: A | Discharge status: Home / Self Care | Payer: Medicare Other | Source: Ambulatory Visit | Attending: Family Medicine | Admitting: Family Medicine

## 2015-11-16 VITALS — BP 110/78 | HR 84 | Temp 98.1°F | Resp 16 | Wt 126.0 lb

## 2015-11-16 DIAGNOSIS — M25579 Pain in unspecified ankle and joints of unspecified foot: Secondary | ICD-10-CM | POA: Insufficient documentation

## 2015-11-16 DIAGNOSIS — S92354A Nondisplaced fracture of fifth metatarsal bone, right foot, initial encounter for closed fracture: Secondary | ICD-10-CM | POA: Diagnosis not present

## 2015-11-16 DIAGNOSIS — S92301A Fracture of unspecified metatarsal bone(s), right foot, initial encounter for closed fracture: Secondary | ICD-10-CM

## 2015-11-16 DIAGNOSIS — S93401A Sprain of unspecified ligament of right ankle, initial encounter: Secondary | ICD-10-CM | POA: Diagnosis not present

## 2015-11-16 DIAGNOSIS — M25571 Pain in right ankle and joints of right foot: Secondary | ICD-10-CM | POA: Diagnosis not present

## 2015-11-16 NOTE — Progress Notes (Signed)
   Subjective:    Patient ID: Kaitlyn Adams, female    DOB: Dec 27, 1950, 65 y.o.   MRN: [id number]  HPI R ankle sprain- pt had inversion of R ankle on 3/21 while at a resort in [place].  Has been trying to stay off her ankle.  + swelling.  Pt continues to have pain but not located over ankle more along lateral foot- worse w/ weight bearing and activity.   Review of Systems For ROS see HPI     Objective:   Physical Exam  Constitutional: She is oriented to person, place, and time. She appears well-developed and well-nourished. No distress.  HENT:  Head: Normocephalic and atraumatic.  Cardiovascular: Intact distal pulses.   Musculoskeletal: She exhibits edema (swelling over dorsum of R foot laterally) and tenderness (TTP over proximal 5th metatarsal, mild TTP over ATFL w/o TTP over lateral or medial malleolus).  Neurological: She is alert and oriented to person, place, and time.  Skin: Skin is warm and dry. No erythema.  Vitals reviewed.         Assessment & Plan:

## 2015-11-16 NOTE — Progress Notes (Signed)
Pre visit review using our clinic review tool, if applicable. No additional management support is needed unless otherwise documented below in the visit note. 

## 2015-11-16 NOTE — Patient Instructions (Signed)
Please go get your xray done at [facility] at [address] let you know your xray results as soon as available Tylenol as needed for pain Ice and elevate!! Call with any questions or concerns Hang in there!!!

## 2015-11-17 DIAGNOSIS — M79671 Pain in right foot: Secondary | ICD-10-CM | POA: Diagnosis not present

## 2015-11-17 DIAGNOSIS — S92351A Displaced fracture of fifth metatarsal bone, right foot, initial encounter for closed fracture: Secondary | ICD-10-CM | POA: Diagnosis not present

## 2015-11-17 NOTE — Assessment & Plan Note (Signed)
New.  Pt's pain and PE consistent w/ mild R ATFL sprain but more concerning is the pain and swelling over the 5th metatarsal.  Get xrays to r/o metatarsal fx.  Unable to place pt in post-op shoe as originally planned b/c our order has not arrived.  Tylenol prn.  No NSAIDs due to Xarelto.  If fx present, will refer to sports med.  Pt expressed understanding and is in agreement w/ plan.

## 2015-11-18 ENCOUNTER — Encounter: Payer: Self-pay | Admitting: Family Medicine

## 2015-11-18 ENCOUNTER — Ambulatory Visit (INDEPENDENT_AMBULATORY_CARE_PROVIDER_SITE_OTHER): Payer: Medicare Other | Admitting: Family Medicine

## 2015-11-18 VITALS — BP 122/83 | HR 76 | Temp 98.1°F | Resp 16 | Ht 64.0 in | Wt 125.1 lb

## 2015-11-18 DIAGNOSIS — K219 Gastro-esophageal reflux disease without esophagitis: Secondary | ICD-10-CM

## 2015-11-18 DIAGNOSIS — Z23 Encounter for immunization: Secondary | ICD-10-CM | POA: Diagnosis not present

## 2015-11-18 DIAGNOSIS — Z0000 Encounter for general adult medical examination without abnormal findings: Secondary | ICD-10-CM | POA: Diagnosis not present

## 2015-11-18 DIAGNOSIS — I480 Paroxysmal atrial fibrillation: Secondary | ICD-10-CM

## 2015-11-18 DIAGNOSIS — E785 Hyperlipidemia, unspecified: Secondary | ICD-10-CM

## 2015-11-18 DIAGNOSIS — I10 Essential (primary) hypertension: Secondary | ICD-10-CM | POA: Diagnosis not present

## 2015-11-18 DIAGNOSIS — Z9189 Other specified personal risk factors, not elsewhere classified: Secondary | ICD-10-CM | POA: Diagnosis not present

## 2015-11-18 DIAGNOSIS — E876 Hypokalemia: Secondary | ICD-10-CM

## 2015-11-18 LAB — HEPATIC FUNCTION PANEL
ALBUMIN: 4.3 g/dL (ref 3.5–5.2)
ALK PHOS: 123 U/L — AB (ref 39–117)
ALT: 22 U/L (ref 0–35)
AST: 24 U/L (ref 0–37)
Bilirubin, Direct: 0.1 mg/dL (ref 0.0–0.3)
TOTAL PROTEIN: 7.3 g/dL (ref 6.0–8.3)
Total Bilirubin: 0.4 mg/dL (ref 0.2–1.2)

## 2015-11-18 LAB — CBC WITH DIFFERENTIAL/PLATELET
BASOS ABS: 0 10*3/uL (ref 0.0–0.1)
Basophils Relative: 0.3 % (ref 0.0–3.0)
EOS PCT: 1.3 % (ref 0.0–5.0)
Eosinophils Absolute: 0.1 10*3/uL (ref 0.0–0.7)
HEMATOCRIT: 39.1 % (ref 36.0–46.0)
HEMOGLOBIN: 12.4 g/dL (ref 12.0–15.0)
LYMPHS PCT: 20.4 % (ref 12.0–46.0)
Lymphs Abs: 1.8 10*3/uL (ref 0.7–4.0)
MCHC: 31.6 g/dL (ref 30.0–36.0)
MCV: 74.5 fl — AB (ref 78.0–100.0)
MONOS PCT: 6.1 % (ref 3.0–12.0)
Monocytes Absolute: 0.5 10*3/uL (ref 0.1–1.0)
Neutro Abs: 6.4 10*3/uL (ref 1.4–7.7)
Neutrophils Relative %: 71.9 % (ref 43.0–77.0)
Platelets: 397 10*3/uL (ref 150.0–400.0)
RBC: 5.25 Mil/uL — AB (ref 3.87–5.11)
RDW: 15.1 % (ref 11.5–15.5)
WBC: 8.9 10*3/uL (ref 4.0–10.5)

## 2015-11-18 LAB — BASIC METABOLIC PANEL
BUN: 12 mg/dL (ref 6–23)
CALCIUM: 10.7 mg/dL — AB (ref 8.4–10.5)
CO2: 27 mEq/L (ref 19–32)
Chloride: 105 mEq/L (ref 96–112)
Creatinine, Ser: 0.94 mg/dL (ref 0.40–1.20)
GFR: 63.29 mL/min (ref >60.00)
Glucose, Bld: 89 mg/dL (ref 70–99)
POTASSIUM: 3.9 meq/L (ref 3.5–5.1)
SODIUM: 138 meq/L (ref 135–145)

## 2015-11-18 LAB — LIPID PANEL
Cholesterol: 158 mg/dL (ref 0–200)
HDL: 39.5 mg/dL (ref >39.00)
LDL CALC: 84 mg/dL (ref 0–99)
NONHDL: 118.79
Total CHOL/HDL Ratio: 4
Triglycerides: 176 mg/dL — ABNORMAL HIGH (ref 0.0–149.0)
VLDL: 35.2 mg/dL (ref 0.0–40.0)

## 2015-11-18 LAB — TSH: TSH: 2.32 u[IU]/mL (ref 0.35–4.50)

## 2015-11-18 MED ORDER — LATANOPROST 0.005 % OP SOLN: 1.0000 [drp] | Freq: Every day | OPHTHALMIC | Status: DC

## 2015-11-18 MED ORDER — ATORVASTATIN CALCIUM 20 MG PO TABS: 20.0000 mg | ORAL_TABLET | Freq: Every day | ORAL | Status: DC

## 2015-11-18 MED ORDER — METOPROLOL SUCCINATE ER 25 MG PO TB24: 25.0000 mg | ORAL_TABLET | Freq: Every day | ORAL | Status: DC

## 2015-11-18 MED ORDER — SPIRONOLACTONE 25 MG PO TABS: 12.5000 mg | ORAL_TABLET | Freq: Every day | ORAL | Status: DC

## 2015-11-18 MED ORDER — DIGOXIN 125 MCG PO TABS: 0.1250 mg | ORAL_TABLET | Freq: Every day | ORAL | Status: DC

## 2015-11-18 MED ORDER — AMITRIPTYLINE HCL 50 MG PO TABS: 50.0000 mg | ORAL_TABLET | Freq: Every day | ORAL | Status: DC

## 2015-11-18 MED ORDER — RIVAROXABAN 20 MG PO TABS: 20.0000 mg | ORAL_TABLET | Freq: Every day | ORAL | Status: DC

## 2015-11-18 MED ORDER — OMEPRAZOLE 40 MG PO CPDR: 40.0000 mg | DELAYED_RELEASE_CAPSULE | Freq: Every day | ORAL | Status: DC

## 2015-11-18 MED ORDER — POTASSIUM CHLORIDE CRYS ER 20 MEQ PO TBCR: 40.0000 meq | EXTENDED_RELEASE_TABLET | Freq: Every day | ORAL | Status: DC

## 2015-11-18 NOTE — Patient Instructions (Signed)
Follow up in 6 months to recheck BP and cholesterol We'll notify you of your lab results and make any changes if needed We'll call you with your GI appt for the colonoscopy Increase the Omeprazole to 40mg  daily You are up to date on Mammogram until Jan 2018, Bone density until 2018 Call with any questions or concerns Happy Spring! Thanks for sticking with [place]!!

## 2015-11-18 NOTE — Assessment & Plan Note (Signed)
Pt is having breakthrough sxs on Omeprazole 20mg .  Increase to 40mg  daily.  Reviewed lifestyle and dietary modifications.  Will follow.

## 2015-11-18 NOTE — Assessment & Plan Note (Signed)
Chronic problem.  Adequate control.  Asymptomatic at this time.  Check labs.  No anticipated med changes. 

## 2015-11-18 NOTE — Progress Notes (Signed)
Pre visit review using our clinic review tool, if applicable. No additional management support is needed unless otherwise documented below in the visit note. 

## 2015-11-18 NOTE — Progress Notes (Signed)
   Subjective:    Patient ID: Kaitlyn Adams, female    DOB: [birth year], 65 y.o.   MRN: [id number]  HPI Here today for CPE.  Risk Factors: HTN- chronic problem, on Metoprolol, Spironolactone.  Denies CP, SOB, HAs, visual changes, edema Hyperlipidemia- chronic problem, on lipitor.  Denies abd pain, N/V, myalgias PAF- chronic problem, on Metoprolol, Dig.  On Xarelto.  Following w/ Dr Martinique.  + palpitations. GERD- pt is on Omeprazole 20mg  but 'i chew a lot of tums' for breakthrough symptoms Physical Activity: active Fall Risk: low Depression: denies Hearing: normal to conversational tones and whispered voice at 6 ft ADL's: independent Cognitive: normal linear thought process, memory and attention intact Home Safety: safe at home, lives w/ husband Height, Weight, BMI, Visual Acuity: see vitals, vision currently 20/100, 20/50 and seeing ophthalmology regularly Counseling: + cologuard- needs colonoscopy but was not able to schedule due medical issues w/ husband.  UTD on mammogram, DEXA.  Due for prevnar. Care team reviewed and updated Labs Ordered: See A&P Care Plan: See A&P    Review of Systems Patient reports no vision/ hearing changes, adenopathy,fever, weight change,  persistant/recurrent hoarseness , swallowing issues, chest pain, edema, persistant/recurrent cough, hemoptysis, dyspnea (rest/exertional/paroxysmal nocturnal), gastrointestinal bleeding (melena, rectal bleeding), abdominal pain, bowel changes, GU symptoms (dysuria, hematuria, incontinence), Gyn symptoms (abnormal  bleeding, pain),  syncope, focal weakness, memory loss, numbness & tingling, skin/hair/nail changes, abnormal bruising or bleeding, anxiety, or depression.     + GERD Objective:   Physical Exam General Appearance:    Alert, cooperative, no distress, appears stated age  Head:    Normocephalic, without obvious abnormality, atraumatic  Eyes:    PERRL, conjunctiva/corneas clear, EOM's intact, fundi    benign,  both eyes  Ears:    Normal TM's and external ear canals, both ears  Nose:   Nares normal, septum midline, mucosa normal, no drainage    or sinus tenderness  Throat:   Lips, mucosa, and tongue normal; teeth and gums normal  Neck:   Supple, symmetrical, trachea midline, no adenopathy;    Thyroid: no enlargement/tenderness/nodules  Back:     Symmetric, no curvature, ROM normal, no CVA tenderness  Lungs:     Clear to auscultation bilaterally, respirations unlabored  Chest Wall:    No tenderness or deformity   Heart:    Regular rate and rhythm, S1 and S2 normal, no murmur, rub   or gallop  Breast Exam:    Deferred to mammo  Abdomen:     Soft, non-tender, bowel sounds active all four quadrants,    no masses, no organomegaly  Genitalia:    Deferred  Rectal:    Extremities:   Extremities normal, atraumatic, no cyanosis or edema  Pulses:   2+ and symmetric all extremities  Skin:   Skin color, texture, turgor normal, no rashes or lesions  Lymph nodes:   Cervical, supraclavicular, and axillary nodes normal  Neurologic:   CNII-XII intact, normal strength, sensation and reflexes    throughout          Assessment & Plan:

## 2015-11-18 NOTE — Assessment & Plan Note (Signed)
Refill provided on K+.

## 2015-11-18 NOTE — Assessment & Plan Note (Signed)
Chronic problem, tolerating statin w/o difficulty.  Discussed need for healthy diet and regular exercise.  Check labs.  Adjust meds prn

## 2015-11-18 NOTE — Assessment & Plan Note (Signed)
Pt's PE WNL.  UTD on mammo, DEXA.  Prevnar given today.  Pt due for colonoscopy due to + cologuard.  Check labs.  Anticipatory guidance provided.

## 2015-11-18 NOTE — Assessment & Plan Note (Signed)
Ongoing issue for pt.  Following w/ Dr Martinique.  Currently regular S1/S2.  Will follow along.

## 2015-11-19 ENCOUNTER — Other Ambulatory Visit: Payer: Medicare Other

## 2015-11-19 ENCOUNTER — Other Ambulatory Visit: Payer: Self-pay | Admitting: General Practice

## 2015-11-19 MED ORDER — LEVOTHYROXINE SODIUM 75 MCG PO TABS: 75.0000 ug | ORAL_TABLET | Freq: Every day | ORAL | Status: DC

## 2015-11-19 NOTE — Progress Notes (Signed)
   Subjective:    Patient ID: Kaitlyn Adams, female    DOB: 02/27/1951, 65 y.o.   MRN: [id number]  HPI Pt has an advanced directive and medical POA- requested a copy from her to update her chart   Review of Systems     Objective:   Physical Exam        Assessment & Plan:

## 2015-11-26 DIAGNOSIS — M25561 Pain in right knee: Secondary | ICD-10-CM | POA: Diagnosis not present

## 2015-12-17 DIAGNOSIS — M79671 Pain in right foot: Secondary | ICD-10-CM | POA: Diagnosis not present

## 2016-01-07 ENCOUNTER — Ambulatory Visit (INDEPENDENT_AMBULATORY_CARE_PROVIDER_SITE_OTHER): Payer: Medicare Other | Admitting: Cardiology

## 2016-01-07 ENCOUNTER — Encounter: Payer: Self-pay | Admitting: Cardiology

## 2016-01-07 VITALS — BP 116/72 | HR 80 | Ht 64.0 in | Wt 125.0 lb

## 2016-01-07 DIAGNOSIS — E785 Hyperlipidemia, unspecified: Secondary | ICD-10-CM

## 2016-01-07 DIAGNOSIS — I480 Paroxysmal atrial fibrillation: Secondary | ICD-10-CM

## 2016-01-07 DIAGNOSIS — I10 Essential (primary) hypertension: Secondary | ICD-10-CM

## 2016-01-07 DIAGNOSIS — I471 Supraventricular tachycardia: Secondary | ICD-10-CM | POA: Diagnosis not present

## 2016-01-07 NOTE — Patient Instructions (Signed)
Continue your current therapy  I will see you in 6 months.   

## 2016-01-07 NOTE — Progress Notes (Signed)
Cardiology Office Note Date:  01/07/2016  Patient ID:  Kaitlyn, Adams 02-03-1951, MRN [id number] PCP:  Annye Asa, MD  Cardiologist:  Dr. Martinique   Chief Complaint: Pafib  History of Present Illness: Kaitlyn Adams is a 65 y.o. female with history of paroxysmal atrial fibrillation, HTN, HLD, hyperthyroidism s/p thyroidectomy with subsequent hypothyroidism, asthma, GERD, fibromyalgia,  mild nonobstructive CAD who presents for follow-up.  She was admitted 9/22-9/23 with complaints of chest discomfort as well as symptoms of recurrent atrial fibrillation including dyspnea, weakness/fatigue, and palpitations with near-syncope. Labs were notable for significant hypokalemia of 2.5 which was repleted.  Troponins were negative. She underwent cath showing mild nonobstructive CAD with 30% dLAD, 40% D1, 25% pLCx, 30% mRCA, 30% RPDA, normal EF >65% with normal LVEDP.  Lasix was stopped due to her hypokalemia. Labs otherwise notable for LDL 140. She had a 30-day event monitor which showed some episodes of PAT. No definite AFib. She was started on Xarelto for [facility] of 3-4 (HTN, age, female, mild vascular disease). She was also started on atorvastatin. 2D echo 05/15/15: EF Q000111Q, normal diastolic function. F/u labs 9/26 showed digoxin level 1.1 (digoxin decreased to 0.125mg  daily).  On follow up she reports episodes of tachycardia and fluttering have improved significantly since September. She still notes some if she is stressed or overly fatigued. She is avoiding caffeine. She did break a metatarsal bone in her right foot when on vacation in [place].   Past Medical History  Diagnosis Date  . Bronchitis     recurrent  . Paroxysmal atrial fibrillation (HCC)   . Renal insufficiency   . Hypothyroidism   . Glaucoma   . Orthostatic hypotension   . Fibromyalgia   . Osteoarthritis   . Hypertension   . Vasomotor rhinitis     exacerbates VCD  . Asthma     - normal spirometry 2009 FEV1   >90% predicted.  - methacoline challenge neg 11/09  . Vocal cord dysfunction     proven on FOB 9/09  . Chronic cough     - sinus CT 03-23-10 > neg.  - allergy profile 03-23-10 > nl, IgE 14.  - flutter valve rx 03-23-10  . GERD (gastroesophageal reflux disease)     exacerbating VCD  . Hyperthyroidism     with hot nodule.  - total thyroidectomy 11-05-08 benign -> subsequent hypothyroidism.  Marland Kitchen Anemia   . Cataract   . Hyperlipidemia   . Hypothyroidism     a. following thyroidectomy.  . Mild CAD     a. LHC 04/2015 - mild nonobstructive CAD with 30% dLAD, 40% D1, 25% pLCx, 30% mRCA, 30% RPDA, normal EF >65% with normal LVEDP.  Marland Kitchen Hypokalemia     Past Surgical History  Procedure Laterality Date  . Abdominal hysterectomy  1980  . Scar tissue removal  1982  . Thyroidectomy  11-05-08  . Basal cell carcinoma excision  11/08  . Eye surgery    . Abdominal hysterectomy    . Thyroid surgery  2010  . Cardiac catheterization N/A 05/15/2015    Procedure: Left Heart Cath and Coronary Angiography;  Surgeon: Sherren Mocha, MD;  Location: [facility];  Service: Cardiovascular;  Laterality: N/A;    Current Outpatient Prescriptions  Medication Sig Dispense Refill  . Acetaminophen (TYLENOL ARTHRITIS PAIN PO) Take 325 mg by mouth as needed (FOR MILD PAIN).     Marland Kitchen amitriptyline (ELAVIL) 50 MG tablet Take 1 tablet (50 mg total) by mouth  at bedtime. 90 tablet 1  . atorvastatin (LIPITOR) 20 MG tablet Take 1 tablet (20 mg total) by mouth daily. 90 tablet 1  . digoxin (LANOXIN) 0.125 MG tablet Take 1 tablet (0.125 mg total) by mouth daily. 90 tablet 1  . Ketotifen Fumarate (THERATEARS ALLERGY OP) Apply 1 drop to eye daily. EACH EYE    . latanoprost (XALATAN) 0.005 % ophthalmic solution Place 1 drop into both eyes at bedtime. 2.5 mL 6  . levothyroxine (SYNTHROID, LEVOTHROID) 75 MCG tablet Take 1 tablet (75 mcg total) by mouth daily. 90 tablet 1  . magnesium oxide (MAG-OX) 400 MG tablet Take 1 tablet (400 mg  total) by mouth daily. 30 tablet 11  . metoprolol succinate (TOPROL-XL) 25 MG 24 hr tablet Take 1 tablet (25 mg total) by mouth daily. 90 tablet 1  . omeprazole (PRILOSEC) 40 MG capsule Take 1 capsule (40 mg total) by mouth daily. (Patient taking differently: Take 20 mg by mouth daily. ) 30 capsule 3  . potassium chloride SA (K-DUR,KLOR-CON) 20 MEQ tablet Take 2 tablets (40 mEq total) by mouth daily. Start today and 6 hours later take 40 meq by mouth today only 05/25/2015. 90 tablet 1  . rivaroxaban (XARELTO) 20 MG TABS tablet Take 1 tablet (20 mg total) by mouth daily with supper. 30 tablet 3  . spironolactone (ALDACTONE) 25 MG tablet Take 0.5 tablets (12.5 mg total) by mouth daily. 45 tablet 6  . traMADol (ULTRAM) 50 MG tablet Take by mouth every 6 (six) hours as needed.     No current facility-administered medications for this visit.    Allergies:   Codeine; Erythromycin; Hydrocod polst-cpm polst er; Sulfonamide derivatives; Tramadol hcl; and Guaifenesin er   Social History:  The patient  reports that she has never smoked. She has never used smokeless tobacco. She reports that she drinks about 0.6 oz of alcohol per week. She reports that she does not use illicit drugs.   Family History:  The patient's family history includes Asthma in her grandchild and mother; Emphysema in her father; Heart disease in her father and mother.  ROS:  Please see the history of present illness.  All other systems are reviewed and otherwise negative.   PHYSICAL EXAM:  VS:  BP 116/72 mmHg  Pulse 80  Ht 5\' 4"  (1.626 m)  Wt 56.7 kg (125 lb)  BMI 21.45 kg/m2 BMI: Body mass index is 21.45 kg/(m^2). Well nourished, well developed WF, in no acute distress HEENT: normocephalic, atraumatic Neck: no JVD, carotid bruits or masses Cardiac:  normal S1, S2; RRR; very soft SEM (noted on previous exams as well) Lungs:  clear to auscultation bilaterally, no wheezing, rhonchi or rales Abd: soft, nontender, no  hepatomegaly, + BS MS: no deformity or atrophy Ext: no edema, right foot in a boot. Skin: warm and dry, no rash Neuro:  moves all extremities spontaneously, no focal abnormalities noted, follows commands Psych: euthymic mood, full affect   EKG: Is done today. NSR rate 80. Possible old septal infarct. Nonspecific ST abnormality. I have personally reviewed and interpreted this study.   Recent Labs: 06/01/2015: Magnesium 1.9 11/18/2015: ALT 22; BUN 12; Creatinine, Ser 0.94; Hemoglobin 12.4; Platelets 397.0; Potassium 3.9; Sodium 138; TSH 2.32  11/18/2015: Cholesterol 158; HDL 39.50; LDL Cholesterol 84; Total CHOL/HDL Ratio 4; Triglycerides 176.0*; VLDL 35.2   CrCl cannot be calculated (Patient has no serum creatinine result on file.).   Wt Readings from Last 3 Encounters:  01/07/16 56.7 kg (125 lb)  11/18/15 56.756 kg (125 lb 2 oz)  11/16/15 57.153 kg (126 lb)     Other studies reviewed: Additional studies/records reviewed today include: summarized above  ASSESSMENT AND PLAN:  1. PAT with history of Pafib. On appropriate therapy with dig and metoprolol. Continue Xarelto. Avoid stressors when possible. Follow up in 6 months. 2. Essential HTN - well controlled now.  3. Hypokalemia - now repleted. 4. Hypothyroidism  5. Hyperlipidemia - good control on atorvastatin  6. Mild CAD - continue risk reduction.   Disposition: F/u with Dr. Martinique in 46 months with fasting lab work and magnesium level.  Current medicines are reviewed at length with the patient today.  The patient did not have any concerns regarding medicines.   Signed, Peter Martinique MD, [facility]   01/07/2016 8:17 AM     [facility] [address] [place], [place] [address] Phone: [phone]

## 2016-01-15 ENCOUNTER — Encounter: Payer: Self-pay | Admitting: Family Medicine

## 2016-01-15 ENCOUNTER — Ambulatory Visit (INDEPENDENT_AMBULATORY_CARE_PROVIDER_SITE_OTHER): Payer: Medicare Other | Admitting: Family Medicine

## 2016-01-15 VITALS — BP 123/82 | HR 86 | Temp 98.2°F | Resp 16 | Ht 64.0 in | Wt 124.5 lb

## 2016-01-15 DIAGNOSIS — J0110 Acute frontal sinusitis, unspecified: Secondary | ICD-10-CM

## 2016-01-15 MED ORDER — AMOXICILLIN 875 MG PO TABS: 875.0000 mg | ORAL_TABLET | Freq: Two times a day (BID) | ORAL | Status: DC

## 2016-01-15 NOTE — Patient Instructions (Signed)
Follow up as needed Start the Amoxicillin twice daily- take w/ food Drink plenty of fluids REST! Mucinex DM or Delsym for daytime cough Call with any questions or concerns Hang in there!!!

## 2016-01-15 NOTE — Assessment & Plan Note (Signed)
New.  Pt's sxs and PE consistent w/ infxn.  Start abx.  Cough meds prn.  Reviewed supportive care and red flags that should prompt return.  Pt expressed understanding and is in agreement w/ plan.

## 2016-01-15 NOTE — Progress Notes (Signed)
   Subjective:    Patient ID: Kaitlyn Adams, female    DOB: 08-21-1951, 65 y.o.   MRN: [id number]  HPI URI- sxs started 'really bad' yesterday.  + PND, cough- productive, HA, ear pain, sore throat.  + body aches.  + sick contacts.  No fevers.  Not currently on seasonal allergy medication.  No N/V.  No tooth pain.   Review of Systems For ROS see HPI     Objective:   Physical Exam  Constitutional: She is oriented to person, place, and time. She appears well-developed and well-nourished. No distress.  HENT:  Head: Normocephalic and atraumatic.  Right Ear: Tympanic membrane normal.  Left Ear: Tympanic membrane normal.  Nose: Mucosal edema and rhinorrhea present. Right sinus exhibits maxillary sinus tenderness and frontal sinus tenderness. Left sinus exhibits maxillary sinus tenderness and frontal sinus tenderness.  Mouth/Throat: Uvula is midline and mucous membranes are normal. Posterior oropharyngeal erythema present. No oropharyngeal exudate.  Eyes: Conjunctivae and EOM are normal. Pupils are equal, round, and reactive to light.  Neck: Normal range of motion. Neck supple.  Cardiovascular: Normal rate, regular rhythm and normal heart sounds.   Pulmonary/Chest: Effort normal and breath sounds normal. No respiratory distress. She has no wheezes.  Lymphadenopathy:    She has no cervical adenopathy.  Neurological: She is alert and oriented to person, place, and time.  Skin: Skin is warm and dry.  Psychiatric: She has a normal mood and affect. Her behavior is normal. Thought content normal.  Vitals reviewed.         Assessment & Plan:

## 2016-01-15 NOTE — Progress Notes (Signed)
Pre visit review using our clinic review tool, if applicable. No additional management support is needed unless otherwise documented below in the visit note. 

## 2016-01-19 DIAGNOSIS — M79671 Pain in right foot: Secondary | ICD-10-CM | POA: Diagnosis not present

## 2016-03-15 DIAGNOSIS — E538 Deficiency of other specified B group vitamins: Secondary | ICD-10-CM | POA: Diagnosis not present

## 2016-03-15 DIAGNOSIS — E559 Vitamin D deficiency, unspecified: Secondary | ICD-10-CM | POA: Diagnosis not present

## 2016-03-15 DIAGNOSIS — E039 Hypothyroidism, unspecified: Secondary | ICD-10-CM | POA: Diagnosis not present

## 2016-03-15 DIAGNOSIS — M899 Disorder of bone, unspecified: Secondary | ICD-10-CM | POA: Diagnosis not present

## 2016-03-29 ENCOUNTER — Ambulatory Visit (INDEPENDENT_AMBULATORY_CARE_PROVIDER_SITE_OTHER): Payer: Medicare Other | Admitting: Family Medicine

## 2016-03-29 ENCOUNTER — Encounter: Payer: Self-pay | Admitting: Family Medicine

## 2016-03-29 VITALS — HR 90 | Temp 97.8°F | Ht 64.0 in | Wt 123.5 lb

## 2016-03-29 DIAGNOSIS — B349 Viral infection, unspecified: Secondary | ICD-10-CM | POA: Diagnosis not present

## 2016-03-29 MED ORDER — BENZONATATE 100 MG PO CAPS: 100.0000 mg | ORAL_CAPSULE | Freq: Three times a day (TID) | ORAL | 0 refills | Status: DC | PRN

## 2016-03-29 NOTE — Patient Instructions (Signed)

## 2016-03-29 NOTE — Progress Notes (Signed)
Pre visit review using our clinic review tool, if applicable. No additional management support is needed unless otherwise documented below in the visit note. 

## 2016-03-29 NOTE — Progress Notes (Signed)
HPI:  Acute visit for feeling sick: -started: 2 days ago - grandaugher with similar symptoms -symptoms:nasal congestion, sore throat, cough, did have diarrhea for one day - now resolved -denies:fever, SOB, tooth pain, swelling, palpitations, chest pain -has tried: nothing -sick contacts/travel/risks: no reported flu, strep or tick exposure  ROS: See pertinent positives and negatives per HPI.  Past Medical History:  Diagnosis Date  . Anemia   . Asthma    - normal spirometry 2009 FEV1  >90% predicted.  - methacoline challenge neg 11/09  . Bronchitis    recurrent  . Cataract   . Chronic cough    - sinus CT 03-23-10 > neg.  - allergy profile 03-23-10 > nl, IgE 14.  - flutter valve rx 03-23-10  . Fibromyalgia   . GERD (gastroesophageal reflux disease)    exacerbating VCD  . Glaucoma   . Hyperlipidemia   . Hypertension   . Hyperthyroidism    with hot nodule.  - total thyroidectomy 11-05-08 benign -> subsequent hypothyroidism.  Marland Kitchen Hypokalemia   . Hypothyroidism   . Hypothyroidism    a. following thyroidectomy.  . Mild CAD    a. LHC 04/2015 - mild nonobstructive CAD with 30% dLAD, 40% D1, 25% pLCx, 30% mRCA, 30% RPDA, normal EF >65% with normal LVEDP.  . Orthostatic hypotension   . Osteoarthritis   . Paroxysmal atrial fibrillation (HCC)   . Renal insufficiency   . Vasomotor rhinitis    exacerbates VCD  . Vocal cord dysfunction    proven on FOB 9/09    Past Surgical History:  Procedure Laterality Date  . ABDOMINAL HYSTERECTOMY  1980  . ABDOMINAL HYSTERECTOMY    . BASAL CELL CARCINOMA EXCISION  11/08  . CARDIAC CATHETERIZATION N/A 05/15/2015   Procedure: Left Heart Cath and Coronary Angiography;  Surgeon: Sherren Mocha, MD;  Location: [facility];  Service: Cardiovascular;  Laterality: N/A;  . EYE SURGERY    . scar tissue removal  1982  . THYROID SURGERY  2010  . THYROIDECTOMY  11-05-08    Family History  Problem Relation Age of Onset  . Asthma Mother   . Heart  disease Mother   . Asthma Grandchild   . Emphysema Father   . Heart disease Father     Social History   Social History  . Marital status: Married    Spouse name: N/A  . Number of children: 1  . Years of education: N/A   Occupational History  . 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 Smoking status: Never Smoker  . Smokeless tobacco: Never Used  . Alcohol use 0.6 oz/week    1 Glasses of wine per week     Comment: occasional wine or beer  . Drug use: No  . Sexual activity: Yes    Birth control/ protection: None   Other Topics Concern  . None   Social History Narrative  . None     Current Outpatient Prescriptions:  .  Acetaminophen (TYLENOL ARTHRITIS PAIN PO), Take 325 mg by mouth as needed (FOR MILD PAIN). , Disp: , Rfl:  .  amitriptyline (ELAVIL) 50 MG tablet, Take 1 tablet (50 mg total) by mouth at bedtime., Disp: 90 tablet, Rfl: 1 .  atorvastatin (LIPITOR) 20 MG tablet, Take 1 tablet (20 mg total) by mouth daily., Disp: 90 tablet, Rfl: 1 .  digoxin (LANOXIN) 0.125 MG tablet, Take 1 tablet (0.125 mg  total) by mouth daily., Disp: 90 tablet, Rfl: 1 .  Ketotifen Fumarate (THERATEARS ALLERGY OP), Apply 1 drop to eye daily. EACH EYE, Disp: , Rfl:  .  latanoprost (XALATAN) 0.005 % ophthalmic solution, Place 1 drop into both eyes at bedtime., Disp: 2.5 mL, Rfl: 6 .  levothyroxine (SYNTHROID, LEVOTHROID) 75 MCG tablet, Take 1 tablet (75 mcg total) by mouth daily., Disp: 90 tablet, Rfl: 1 .  magnesium oxide (MAG-OX) 400 MG tablet, Take 1 tablet (400 mg total) by mouth daily., Disp: 30 tablet, Rfl: 11 .  metoprolol succinate (TOPROL-XL) 25 MG 24 hr tablet, Take 1 tablet (25 mg total) by mouth daily., Disp: 90 tablet, Rfl: 1 .  omeprazole (PRILOSEC) 40 MG capsule, Take 1 capsule (40 mg total) by mouth daily. (Patient taking differently: Take 20 mg by mouth daily. ), Disp: 30 capsule, Rfl: 3 .  potassium chloride SA  (K-DUR,KLOR-CON) 20 MEQ tablet, Take 2 tablets (40 mEq total) by mouth daily. Start today and 6 hours later take 40 meq by mouth today only 05/25/2015., Disp: 90 tablet, Rfl: 1 .  rivaroxaban (XARELTO) 20 MG TABS tablet, Take 1 tablet (20 mg total) by mouth daily with supper., Disp: 30 tablet, Rfl: 3 .  spironolactone (ALDACTONE) 25 MG tablet, Take 0.5 tablets (12.5 mg total) by mouth daily., Disp: 45 tablet, Rfl: 6 .  benzonatate (TESSALON PERLES) 100 MG capsule, Take 1 capsule (100 mg total) by mouth 3 (three) times daily as needed for cough., Disp: 20 capsule, Rfl: 0  EXAM:  Vitals:   03/29/16 1257  Pulse: 90  Temp: 97.8 F (36.6 C)    Body mass index is 21.2 kg/m.  GENERAL: vitals reviewed and listed above, alert, oriented, appears well hydrated and in no acute distress  HEENT: atraumatic, conjunttiva clear, no obvious abnormalities on inspection of external nose and ears, normal appearance of ear canals and TMs, clear nasal congestion, mild post oropharyngeal erythema with PND, no tonsillar edema or exudate, no sinus TTP  NECK: no obvious masses on inspection  LUNGS: clear to auscultation bilaterally, no wheezes, rales or rhonchi, good air movement  CV: HRRR, no peripheral edema  ABD: BS+, soft, nttp  MS: moves all extremities without noticeable abnormality  PSYCH: pleasant and cooperative, no obvious depression or anxiety  ASSESSMENT AND PLAN:  Discussed the following assessment and plan:  Viral illness  -given HPI and exam findings today, a serious infection or illness is unlikely. We discussed potential etiologies, with VURI being most likely, and advised supportive care and monitoring. We discussed treatment side effects, likely course, antibiotic misuse, transmission, and signs of developing a serious illness. -of course, particularly given her PMH, we advised to return or notify a doctor immediately if symptoms worsen or persist or new concerns arise.    Patient  Instructions  Viral Infections A virus is a type of germ. Viruses can cause:  Minor sore throats.  Aches and pains.  Headaches.  Runny nose.  Rashes.  Watery eyes.  Tiredness.  Coughs.  Loss of appetite.  Feeling sick to your stomach (nausea).  Throwing up (vomiting).  Watery poop (diarrhea). HOME CARE   Only take medicines as told by your doctor.  Drink enough water and fluids to keep your pee (urine) clear or pale yellow. Sports drinks are a good choice.  Get plenty of rest and eat healthy. Soups and broths with crackers or rice are fine. GET HELP RIGHT AWAY IF:   You have a very bad headache.  You have shortness of breath.  You have chest pain or neck pain.  You have an unusual rash.  You cannot stop throwing up.  You have watery poop that does not stop.  You cannot keep fluids down.  You or your child has a temperature by mouth above 102 F (38.9 C), not controlled by medicine.  Your baby is older than 3 months with a rectal temperature of 102 F (38.9 C) or higher.  Your baby is 62 months old or younger with a rectal temperature of 100.4 F (38 C) or higher. MAKE SURE YOU:   Understand these instructions.  Will watch this condition.  Will get help right away if you are not doing well or get worse.   This information is not intended to replace advice given to you by your health care provider. Make sure you discuss any questions you have with your health care provider.   Document Released: 07/21/2008 Document Revised: 10/31/2011 Document Reviewed: 01/14/2015 Elsevier Interactive Patient Education 2016 [place]

## 2016-04-11 ENCOUNTER — Other Ambulatory Visit: Payer: Self-pay | Admitting: Family Medicine

## 2016-04-19 DIAGNOSIS — H40013 Open angle with borderline findings, low risk, bilateral: Secondary | ICD-10-CM | POA: Diagnosis not present

## 2016-05-11 ENCOUNTER — Other Ambulatory Visit: Payer: Self-pay | Admitting: Cardiology

## 2016-05-18 ENCOUNTER — Encounter: Payer: Self-pay | Admitting: Family Medicine

## 2016-05-18 ENCOUNTER — Ambulatory Visit (INDEPENDENT_AMBULATORY_CARE_PROVIDER_SITE_OTHER): Payer: Medicare Other | Admitting: Family Medicine

## 2016-05-18 VITALS — BP 122/82 | Temp 98.0°F | Resp 16 | Ht 64.0 in | Wt 123.1 lb

## 2016-05-18 DIAGNOSIS — F419 Anxiety disorder, unspecified: Secondary | ICD-10-CM

## 2016-05-18 DIAGNOSIS — A09 Infectious gastroenteritis and colitis, unspecified: Secondary | ICD-10-CM | POA: Diagnosis not present

## 2016-05-18 DIAGNOSIS — F418 Other specified anxiety disorders: Secondary | ICD-10-CM

## 2016-05-18 DIAGNOSIS — E785 Hyperlipidemia, unspecified: Secondary | ICD-10-CM

## 2016-05-18 DIAGNOSIS — I10 Essential (primary) hypertension: Secondary | ICD-10-CM | POA: Diagnosis not present

## 2016-05-18 DIAGNOSIS — F329 Major depressive disorder, single episode, unspecified: Secondary | ICD-10-CM | POA: Insufficient documentation

## 2016-05-18 DIAGNOSIS — F32A Depression, unspecified: Secondary | ICD-10-CM | POA: Insufficient documentation

## 2016-05-18 DIAGNOSIS — R197 Diarrhea, unspecified: Secondary | ICD-10-CM | POA: Insufficient documentation

## 2016-05-18 LAB — LIPID PANEL
CHOLESTEROL: 129 mg/dL (ref 0–200)
HDL: 36.9 mg/dL — AB (ref >39.00)
LDL Cholesterol: 55 mg/dL (ref 0–99)
NonHDL: 92.51
Total CHOL/HDL Ratio: 4
Triglycerides: 189 mg/dL — ABNORMAL HIGH (ref 0.0–149.0)
VLDL: 37.8 mg/dL (ref 0.0–40.0)

## 2016-05-18 LAB — BASIC METABOLIC PANEL
BUN: 16 mg/dL (ref 6–23)
CALCIUM: 9.6 mg/dL (ref 8.4–10.5)
CO2: 27 meq/L (ref 19–32)
Chloride: 104 mEq/L (ref 96–112)
Creatinine, Ser: 1.05 mg/dL (ref 0.40–1.20)
GFR: 55.61 mL/min — ABNORMAL LOW (ref >60.00)
GLUCOSE: 90 mg/dL (ref 70–99)
Potassium: 4.1 mEq/L (ref 3.5–5.1)
SODIUM: 138 meq/L (ref 135–145)

## 2016-05-18 LAB — CBC WITH DIFFERENTIAL/PLATELET
BASOS ABS: 0.1 10*3/uL (ref 0.0–0.1)
Basophils Relative: 0.7 % (ref 0.0–3.0)
EOS ABS: 0.1 10*3/uL (ref 0.0–0.7)
Eosinophils Relative: 0.7 % (ref 0.0–5.0)
HCT: 26 % — ABNORMAL LOW (ref 36.0–46.0)
Hemoglobin: 7.8 g/dL — CL (ref 12.0–15.0)
LYMPHS ABS: 1.6 10*3/uL (ref 0.7–4.0)
Lymphocytes Relative: 21.6 % (ref 12.0–46.0)
MCHC: 29.8 g/dL — ABNORMAL LOW (ref 30.0–36.0)
MCV: 58.1 fl — AB (ref 78.0–100.0)
MONO ABS: 0.4 10*3/uL (ref 0.1–1.0)
MONOS PCT: 5.4 % (ref 3.0–12.0)
NEUTROS PCT: 71.6 % (ref 43.0–77.0)
Neutro Abs: 5.4 10*3/uL (ref 1.4–7.7)
Platelets: 426 10*3/uL — ABNORMAL HIGH (ref 150.0–400.0)
RBC: 4.47 Mil/uL (ref 3.87–5.11)
RDW: 18.2 % — ABNORMAL HIGH (ref 11.5–15.5)
WBC: 7.5 10*3/uL (ref 4.0–10.5)

## 2016-05-18 LAB — HEPATIC FUNCTION PANEL
ALBUMIN: 4.3 g/dL (ref 3.5–5.2)
ALK PHOS: 105 U/L (ref 39–117)
ALT: 14 U/L (ref 0–35)
AST: 16 U/L (ref 0–37)
Bilirubin, Direct: 0.1 mg/dL (ref 0.0–0.3)
TOTAL PROTEIN: 7.1 g/dL (ref 6.0–8.3)
Total Bilirubin: 0.4 mg/dL (ref 0.2–1.2)

## 2016-05-18 MED ORDER — ALPRAZOLAM 0.5 MG PO TABS: 0.5000 mg | ORAL_TABLET | Freq: Two times a day (BID) | ORAL | 3 refills | Status: DC | PRN

## 2016-05-18 MED ORDER — SERTRALINE HCL 25 MG PO TABS: 25.0000 mg | ORAL_TABLET | Freq: Every day | ORAL | 6 refills | Status: DC

## 2016-05-18 NOTE — Progress Notes (Signed)
   Subjective:    Patient ID: Kaitlyn Adams, female    DOB: Dec 26, 1950, 65 y.o.   MRN: [id number]  HPI HTN- chronic problem, on Spironolactone, Metoprolol w/ good control.  No CP.  + SOB- 'for a couple of weeks w/ no rhyme or reason'.  Pt reports increased stress at work, 'i'm a nervous wreck'.  Took a coworkers Xanax yesterday w/ good results.  Pt reports anxiousness is 'all the time'.  Hyperlipidemia- chronic problem, on Lipitor.  Denies abd pain.  Chronic myalgias  GI illness- sxs started Monday w/ vomiting x1 and diarrhea.  Continues to have diarrhea and body aches.  'horrible headache'.  + sick contacts.  Having more than 4-5 stools/day.  Has not taken any Immodium yet.   Review of Systems For ROS see HPI     Objective:   Physical Exam  Constitutional: She is oriented to person, place, and time. She appears well-developed and well-nourished. No distress.  HENT:  Head: Normocephalic and atraumatic.  Eyes: Conjunctivae and EOM are normal. Pupils are equal, round, and reactive to light.  Neck: Normal range of motion. Neck supple. No thyromegaly present.  Cardiovascular: Normal rate, regular rhythm, normal heart sounds and intact distal pulses.   No murmur heard. Pulmonary/Chest: Effort normal and breath sounds normal. No respiratory distress. She has no wheezes. She has no rales.  Abdominal: Soft. She exhibits no distension. There is no tenderness. There is no guarding.  Musculoskeletal: She exhibits no edema.  Lymphadenopathy:    She has no cervical adenopathy.  Neurological: She is alert and oriented to person, place, and time.  Skin: Skin is warm and dry.  Psychiatric: Her behavior is normal.  Very anxious, tremulous  Vitals reviewed.         Assessment & Plan:

## 2016-05-18 NOTE — Patient Instructions (Signed)
Follow up in 3-4 weeks to recheck anxiety We'll notify you of your lab results and make any changes if needed Start the Zoloft once daily for anxiety Use the Alprazolam as needed for anxiety.  Start w/ 1/2 tab and then increase to 1 tab if needed for panicked moments Drink plenty of fluids Take Immodium for the diarrhea If you continue to have shortness of breath- please call Dr [place] and schedule an appt Call with any questions or concerns Hang in there!!!

## 2016-05-18 NOTE — Progress Notes (Signed)
Pre visit review using our clinic review tool, if applicable. No additional management support is needed unless otherwise documented below in the visit note. 

## 2016-05-19 NOTE — Progress Notes (Signed)
Called pt and lmovm to return call.

## 2016-05-20 ENCOUNTER — Other Ambulatory Visit: Payer: Self-pay | Admitting: Family Medicine

## 2016-05-20 DIAGNOSIS — D62 Acute posthemorrhagic anemia: Secondary | ICD-10-CM

## 2016-05-20 MED ORDER — FERROUS SULFATE 325 (65 FE) MG PO TABS: 325.0000 mg | ORAL_TABLET | Freq: Every day | ORAL | 3 refills | Status: DC

## 2016-05-20 NOTE — Assessment & Plan Note (Signed)
Chronic problem.  Well controlled.  + SOB for the last few weeks w/o associated CP.  Denies palpitations at that time (hx of Afib).  Check labs.  If no thyroid abnormality or anemia will refer back to cards for evaluation of SOB.  Reviewed supportive care and red flags that should prompt return.  Pt expressed understanding and is in agreement w/ plan.

## 2016-05-20 NOTE — Assessment & Plan Note (Signed)
New.  Pt reports very high levels of stress and anxiety at this time.  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  Pt expressed understanding and is in agreement w/ plan.  Will follow closely.

## 2016-05-20 NOTE — Assessment & Plan Note (Signed)
Chronic problem.  Tolerating Lipitor w/o difficulty.  Check labs.  Adjust meds prn  

## 2016-05-20 NOTE — Assessment & Plan Note (Signed)
New.  Suspect that this is viral as pt has no red flags on hx or PE.  Check labs.  Immodium PRN.  Pt expressed understanding and is in agreement w/ plan.

## 2016-06-13 ENCOUNTER — Other Ambulatory Visit: Payer: Self-pay | Admitting: Family Medicine

## 2016-06-14 ENCOUNTER — Other Ambulatory Visit: Payer: Self-pay | Admitting: Family Medicine

## 2016-06-15 ENCOUNTER — Ambulatory Visit (INDEPENDENT_AMBULATORY_CARE_PROVIDER_SITE_OTHER): Payer: Medicare Other | Admitting: Family Medicine

## 2016-06-15 ENCOUNTER — Encounter: Payer: Self-pay | Admitting: Family Medicine

## 2016-06-15 VITALS — BP 122/82 | HR 70 | Temp 98.1°F | Resp 16 | Ht 64.0 in | Wt 120.1 lb

## 2016-06-15 DIAGNOSIS — J410 Simple chronic bronchitis: Secondary | ICD-10-CM

## 2016-06-15 DIAGNOSIS — F329 Major depressive disorder, single episode, unspecified: Secondary | ICD-10-CM

## 2016-06-15 DIAGNOSIS — F418 Other specified anxiety disorders: Secondary | ICD-10-CM

## 2016-06-15 DIAGNOSIS — D509 Iron deficiency anemia, unspecified: Secondary | ICD-10-CM | POA: Diagnosis not present

## 2016-06-15 DIAGNOSIS — F419 Anxiety disorder, unspecified: Principal | ICD-10-CM

## 2016-06-15 LAB — CBC WITH DIFFERENTIAL/PLATELET
BASOS PCT: 0.9 % (ref 0.0–3.0)
Basophils Absolute: 0.1 10*3/uL (ref 0.0–0.1)
Eosinophils Absolute: 0.1 10*3/uL (ref 0.0–0.7)
Eosinophils Relative: 0.9 % (ref 0.0–5.0)
HEMATOCRIT: 31 % — AB (ref 36.0–46.0)
Hemoglobin: 9.2 g/dL — ABNORMAL LOW (ref 12.0–15.0)
LYMPHS PCT: 23.4 % (ref 12.0–46.0)
Lymphs Abs: 1.5 10*3/uL (ref 0.7–4.0)
MONOS PCT: 7.6 % (ref 3.0–12.0)
Monocytes Absolute: 0.5 10*3/uL (ref 0.1–1.0)
NEUTROS ABS: 4.2 10*3/uL (ref 1.4–7.7)
Neutrophils Relative %: 67.2 % (ref 43.0–77.0)
PLATELETS: 430 10*3/uL — AB (ref 150.0–400.0)
RBC: 4.71 Mil/uL (ref 3.87–5.11)
RDW: 30.3 % — AB (ref 11.5–15.5)
WBC: 6.3 10*3/uL (ref 4.0–10.5)

## 2016-06-15 LAB — BASIC METABOLIC PANEL
BUN: 11 mg/dL (ref 6–23)
CALCIUM: 10.4 mg/dL (ref 8.4–10.5)
CO2: 27 mEq/L (ref 19–32)
CREATININE: 1.09 mg/dL (ref 0.40–1.20)
Chloride: 104 mEq/L (ref 96–112)
GFR: 53.25 mL/min — AB (ref >60.00)
GLUCOSE: 89 mg/dL (ref 70–99)
POTASSIUM: 3.9 meq/L (ref 3.5–5.1)
Sodium: 139 mEq/L (ref 135–145)

## 2016-06-15 MED ORDER — BECLOMETHASONE DIPROPIONATE 80 MCG/ACT IN AERS: 1.0000 | INHALATION_SPRAY | Freq: Two times a day (BID) | RESPIRATORY_TRACT | 6 refills | Status: DC

## 2016-06-15 NOTE — Assessment & Plan Note (Signed)
Ongoing issue for pt.  Switch Sertraline to AM dose.  Monitor for improvement

## 2016-06-15 NOTE — Patient Instructions (Signed)
Follow up in 4-6 weeks to recheck anxiety Switch the Sertraline to morning dose Use the Qvar- 1 puff twice daily- to prevent shortness of breath Call with any questions or concerns Happy Fall!!!

## 2016-06-15 NOTE — Progress Notes (Signed)
   Subjective:    Patient ID: Kaitlyn Adams, female    DOB: [birth year], 65 y.o.   MRN: [id number]  HPI Anxiety- pt was started on Zoloft at her last visit.  Pt only took the Sertraline x2 nights b/c she found this kept her awake.  Has not attempted to take in the AM.  If really wound before bed, will take the Alprazolam.  Pt reports she is feeling 'some better'.  Pt continues to have exhaustion due to being overworked.  Pt continues to have SOB- has seen Dr Joya Gaskins.  Pt does not use inhalers due to hx of Afib and resulting tachycardia  Anemia- noted at last visit.  Pt is taking FeSO4 daily but continues to crave crushed ice and have SOB.   Review of Systems For ROS see HPI     Objective:   Physical Exam  Constitutional: She is oriented to person, place, and time. She appears well-developed and well-nourished. No distress.  HENT:  Head: Normocephalic and atraumatic.  Eyes: Conjunctivae and EOM are normal. Pupils are equal, round, and reactive to light.  Neck: Normal range of motion. Neck supple. No thyromegaly present.  Cardiovascular: Normal rate, regular rhythm and intact distal pulses.   Murmur (II/VI SEM) heard. Pulmonary/Chest: Effort normal and breath sounds normal. No respiratory distress.  Abdominal: Soft. She exhibits no distension. There is no tenderness.  Musculoskeletal: She exhibits no edema.  Lymphadenopathy:    She has no cervical adenopathy.  Neurological: She is alert and oriented to person, place, and time.  Skin: Skin is warm and dry.  Psychiatric: She has a normal mood and affect. Her behavior is normal.  Vitals reviewed.         Assessment & Plan:

## 2016-06-15 NOTE — Assessment & Plan Note (Addendum)
New to provider, ongoing for pt.  Reviewed xrays done previously that show chronic bronchitis/emphysema.  She is not able to take Albuterol due to paroxsymal Afib but she is having increased shortness of breath.  This may be due to her current anemia but due to her pulmonary hx, will start Qvar.  Reviewed supportive care and red flags that should prompt return.  Pt expressed understanding and is in agreement w/ plan.

## 2016-06-15 NOTE — Progress Notes (Signed)
Pre visit review using our clinic review tool, if applicable. No additional management support is needed unless otherwise documented below in the visit note. 

## 2016-06-15 NOTE — Assessment & Plan Note (Signed)
New at last visit.  Pt is taking her FeSO4 325 daily but continues to crave crushed ice and struggle w/ SOB.  GI referral was placed but pt has not been contacted about this.  Will follow up on this.  Repeat CBC today.

## 2016-06-16 ENCOUNTER — Telehealth: Payer: Self-pay | Admitting: Family Medicine

## 2016-06-16 MED ORDER — DIGOXIN 125 MCG PO TABS: 0.1250 mg | ORAL_TABLET | Freq: Every day | ORAL | 1 refills | Status: DC

## 2016-06-16 MED ORDER — AMITRIPTYLINE HCL 50 MG PO TABS: 50.0000 mg | ORAL_TABLET | Freq: Every day | ORAL | 1 refills | Status: DC

## 2016-06-16 NOTE — Telephone Encounter (Signed)
Patient calling to report she was seen in the office yesterday and refills should have been sent to the pharmacy for amitriptyline (ELAVIL) 50 MG tablet and digoxin (LANOXIN) 0.125 MG tablet.  Pharmacy states they have not received yet.  Please send refill requests to  [place], [address] AT [place] [phone] (Phone) [phone] (Fax)   Patient also reports when she clicks on the link to see her lab results in mychart, nothing appears.  Patient would like to know what results are.

## 2016-06-16 NOTE — Telephone Encounter (Signed)
Medication filled to pharmacy as requested.   

## 2016-06-23 ENCOUNTER — Encounter: Payer: Self-pay | Admitting: Gastroenterology

## 2016-06-27 ENCOUNTER — Other Ambulatory Visit: Payer: Self-pay | Admitting: Physician Assistant

## 2016-06-27 NOTE — Telephone Encounter (Signed)
Rx request sent to pharmacy.  

## 2016-06-29 ENCOUNTER — Ambulatory Visit (INDEPENDENT_AMBULATORY_CARE_PROVIDER_SITE_OTHER): Payer: Medicare Other | Admitting: Gastroenterology

## 2016-06-29 ENCOUNTER — Encounter: Payer: Self-pay | Admitting: Gastroenterology

## 2016-06-29 VITALS — BP 110/70 | HR 84 | Ht 63.0 in | Wt 119.5 lb

## 2016-06-29 DIAGNOSIS — Z7901 Long term (current) use of anticoagulants: Secondary | ICD-10-CM

## 2016-06-29 DIAGNOSIS — Z86010 Personal history of colonic polyps: Secondary | ICD-10-CM | POA: Diagnosis not present

## 2016-06-29 DIAGNOSIS — K5909 Other constipation: Secondary | ICD-10-CM | POA: Diagnosis not present

## 2016-06-29 DIAGNOSIS — D509 Iron deficiency anemia, unspecified: Secondary | ICD-10-CM | POA: Diagnosis not present

## 2016-06-29 DIAGNOSIS — R195 Other fecal abnormalities: Secondary | ICD-10-CM | POA: Diagnosis not present

## 2016-06-29 DIAGNOSIS — K219 Gastro-esophageal reflux disease without esophagitis: Secondary | ICD-10-CM

## 2016-06-29 MED ORDER — NA SULFATE-K SULFATE-MG SULF 17.5-3.13-1.6 GM/177ML PO SOLN: 1.0000 | Freq: Once | ORAL | 0 refills | Status: AC

## 2016-06-29 MED ORDER — LINACLOTIDE 145 MCG PO CAPS: 145.0000 ug | ORAL_CAPSULE | Freq: Every day | ORAL | 3 refills | Status: DC

## 2016-06-29 NOTE — Patient Instructions (Signed)
We have sent the following medications to your pharmacy for you to pick up at your convenience:  [place] have been scheduled for an endoscopy and colonoscopy. Please follow the written instructions given to you at your visit today. Please pick up your prep supplies at the pharmacy within the next 1-3 days. If you use inhalers (even only as needed), please bring them with you on the day of your procedure. Your physician has requested that you go to www.[email or web address].com and enter the access code given to you at your visit today. This web site gives a general overview about your procedure. However, you should still follow specific instructions given to you by our office regarding your preparation for the procedure.

## 2016-07-07 ENCOUNTER — Encounter: Payer: Self-pay | Admitting: Gastroenterology

## 2016-07-07 DIAGNOSIS — Z7901 Long term (current) use of anticoagulants: Secondary | ICD-10-CM | POA: Insufficient documentation

## 2016-07-07 DIAGNOSIS — D509 Iron deficiency anemia, unspecified: Secondary | ICD-10-CM | POA: Insufficient documentation

## 2016-07-07 DIAGNOSIS — Z86010 Personal history of colonic polyps: Secondary | ICD-10-CM | POA: Insufficient documentation

## 2016-07-07 DIAGNOSIS — K5909 Other constipation: Secondary | ICD-10-CM | POA: Insufficient documentation

## 2016-07-07 DIAGNOSIS — R195 Other fecal abnormalities: Secondary | ICD-10-CM | POA: Insufficient documentation

## 2016-07-07 NOTE — Progress Notes (Signed)
06/30/2016 Kaitlyn Adams [id number] Jul 16, 1951   HISTORY OF PRESENT ILLNESS:  This is a pleasant 65 year old female who is new to our office and was referred here by her PCP, Dr.  Birdie Riddle, for evaluation regarding recent/new microcytic anemia as well as a positive Cologuard study.  Her hemoglobin seven months ago was 12.4 g and one month ago was down to 7.8 g. She was recently started on ferrous sulfate 325 mg by her PCP. She denies seeing any black or bloody stools. Repeat CBC in 3 weeks ago showed a slight increase in hemoglobin to 9.2 g.  She does admit to some constipation that has been present for several years for which she has been using a generic brand laxative on a daily basis. Admits to acid reflux for several years. Takes omeprazole 40 mg daily for that and overall that seems to work well for that condition.  She had an EGD and colonoscopy in April/May 2003 by Dr. Earlean Shawl.  I have reviewed those records. EGD revealed gastritis and colonoscopy revealed one polyp that was removed as well as some melanosis coli from laxative use.  Pathology revealed tubular adenoma.  Biopsies were also taken of her ileocecal valve that showed active inflammation melanosis Coli.  She is on Xarelto for paroxysmal atrial fibrillation. She follows with Dr. Martinique and according to his note in May of this year everything has been stable from a cardiac standpoint.  Denies any CP, SOB, or palpitations.    Past Medical History:  Diagnosis Date  . Anemia   . Asthma    - normal spirometry 2009 FEV1  >90% predicted.  - methacoline challenge neg 11/09  . Bronchitis    recurrent  . Cataract   . Chronic cough    - sinus CT 03-23-10 > neg.  - allergy profile 03-23-10 > nl, IgE 14.  - flutter valve rx 03-23-10  . Fibromyalgia   . GERD (gastroesophageal reflux disease)    exacerbating VCD  . Glaucoma   . Hyperlipidemia   . Hypertension   . Hyperthyroidism    with hot nodule.  - total thyroidectomy 11-05-08  benign -> subsequent hypothyroidism.  Marland Kitchen Hypokalemia   . Hypothyroidism   . Hypothyroidism    a. following thyroidectomy.  . Mild CAD    a. LHC 04/2015 - mild nonobstructive CAD with 30% dLAD, 40% D1, 25% pLCx, 30% mRCA, 30% RPDA, normal EF >65% with normal LVEDP.  . Orthostatic hypotension   . Osteoarthritis   . Paroxysmal atrial fibrillation (HCC)   . Renal insufficiency   . Vasomotor rhinitis    exacerbates VCD  . Vocal cord dysfunction    proven on FOB 9/09   Past Surgical History:  Procedure Laterality Date  . ABDOMINAL HYSTERECTOMY  1980  . ABDOMINAL HYSTERECTOMY    . BASAL CELL CARCINOMA EXCISION  11/08  . CARDIAC CATHETERIZATION N/A 05/15/2015   Procedure: Left Heart Cath and Coronary Angiography;  Surgeon: Sherren Mocha, MD;  Location: [facility];  Service: Cardiovascular;  Laterality: N/A;  . EYE SURGERY    . scar tissue removal  1982  . THYROID SURGERY  2010  . THYROIDECTOMY  11-05-08    reports that she has never smoked. She has never used smokeless tobacco. She reports that she drinks about 0.6 oz of alcohol per week . She reports that she does not use drugs. family history includes Asthma in her grandchild and mother; Emphysema in her father; Heart disease in her  father and mother. Allergies  Allergen Reactions  . Codeine     REACTION: itching  . Erythromycin     REACTION: nausea and vomiting  . Hydrocod Polst-Cpm Polst Er Other (See Comments)    Unknown reaction  . Sulfonamide Derivatives     REACTION: nausea and vomiting  . Tramadol Hcl     REACTION: itching  . Guaifenesin Er Palpitations      Outpatient Encounter Prescriptions as of 06/29/2016  Medication Sig  . Acetaminophen (TYLENOL ARTHRITIS PAIN PO) Take 325 mg by mouth as needed (FOR MILD PAIN).   Marland Kitchen ALPRAZolam (XANAX) 0.5 MG tablet Take 1 tablet (0.5 mg total) by mouth 2 (two) times daily as needed for anxiety.  Marland Kitchen amitriptyline (ELAVIL) 50 MG tablet Take 1 tablet (50 mg total) by mouth at  bedtime.  Marland Kitchen atorvastatin (LIPITOR) 20 MG tablet TAKE 1 TABLET(20 MG) BY MOUTH DAILY  . beclomethasone (QVAR) 80 MCG/ACT inhaler Inhale 1 puff into the lungs 2 (two) times daily.  . digoxin (LANOXIN) 0.125 MG tablet Take 1 tablet (0.125 mg total) by mouth daily.  . ferrous sulfate 325 (65 FE) MG tablet Take 1 tablet (325 mg total) by mouth daily with breakfast.  . Ketotifen Fumarate (THERATEARS ALLERGY OP) Apply 1 drop to eye daily. EACH EYE  . latanoprost (XALATAN) 0.005 % ophthalmic solution Place 1 drop into both eyes at bedtime.  Marland Kitchen levothyroxine (SYNTHROID, LEVOTHROID) 75 MCG tablet Take 1 tablet (75 mcg total) by mouth daily.  . magnesium oxide (MAG-OX) 400 MG tablet TAKE 1 TABLET(400 MG) BY MOUTH DAILY  . metoprolol succinate (TOPROL-XL) 25 MG 24 hr tablet Take 1 tablet (25 mg total) by mouth daily.  Marland Kitchen omeprazole (PRILOSEC) 40 MG capsule TAKE 1 CAPSULE(40 MG) BY MOUTH DAILY  . potassium chloride SA (K-DUR,KLOR-CON) 20 MEQ tablet Take 2 tablets (40 mEq total) by mouth daily. Start today and 6 hours later take 40 meq by mouth today only 05/25/2015.  . sertraline (ZOLOFT) 25 MG tablet Take 1 tablet (25 mg total) by mouth daily.  Marland Kitchen spironolactone (ALDACTONE) 25 MG tablet Take 0.5 tablets (12.5 mg total) by mouth daily.  Alveda Reasons 20 MG TABS tablet TAKE 1 TABLET(20 MG) BY MOUTH DAILY  . linaclotide (LINZESS) 145 MCG CAPS capsule Take 1 capsule (145 mcg total) by mouth daily before breakfast.  . [EXPIRED] Na Sulfate-K Sulfate-Mg Sulf 17.5-3.13-1.6 GM/180ML SOLN Take 1 kit by mouth once.  . [DISCONTINUED] rivaroxaban (XARELTO) 20 MG TABS tablet Take 1 tablet (20 mg total) by mouth daily with supper.   No facility-administered encounter medications on file as of 06/29/2016.      REVIEW OF SYSTEMS  : All other systems reviewed and negative except where noted in the History of Present Illness.   PHYSICAL EXAM: BP 110/70   Pulse 84   Ht _0  (1.6 m) Comment: measured without shoes  Wt 119 lb  8 oz (54.2 kg)   BMI 21.17 kg/m  General: Well developed white female in no acute distress Head: Normocephalic and atraumatic Eyes:  Sclerae anicteric, conjunctiva pink. Ears: Normal auditory acuity Lungs: Clear throughout to auscultation; no increased WOB Heart: Regular rate and rhythm Abdomen: Soft, non-distended. Normal bowel sounds.  Non-tender. Rectal:  Will be done at the time of colonoscopy. Musculoskeletal: Symmetrical with no gross deformities  Skin: No lesions on visible extremities Extremities: No edema  Neurological: Alert oriented x 4, grossly non-focal Psychological:  Alert and cooperative. Normal mood and affect  ASSESSMENT AND PLAN: -  65 year old female with personal history of adenomatous polyp, new microcytic anemia with 5 grams drop in Hgb over the course of 6 months, positive Cologuard study.  Needs EGD and colonoscopy due rule out polyps, malignancy, and any bleeding source.  Will schedule. -Constipation:  Will try Linzess 72 mg daily in place of her daily OTC stimulant laxatives. -GERD:  Long-standing but overall well controlled on omeprazole 40 mg daily.  Will continue. -PAF on chronic anticoagulation with Xarelto:  Will hold Xarelto for 2 days prior to endoscopic procedures - will instruct when and how to resume after procedure. Benefits and risks of procedure explained including risks of bleeding, perforation, infection, missed lesions, reactions to medications and possible need for hospitalization and surgery for complications. Additional rare but real risk of stroke or other vascular clotting events off of Xarelto also explained and need to seek urgent help if any signs of these problems occur. Will communicate by phone or EMR with patient's prescribing provider, Dr. Martinique, to confirm that holding is reasonable in this case.    CC:  Midge Minium, MD

## 2016-07-08 NOTE — Progress Notes (Signed)
Reviewed and agree with management plan.  Tonilynn Bieker T. Anuhea Gassner, MD FACG 

## 2016-07-20 ENCOUNTER — Ambulatory Visit (INDEPENDENT_AMBULATORY_CARE_PROVIDER_SITE_OTHER): Payer: Medicare Other | Admitting: Family Medicine

## 2016-07-20 ENCOUNTER — Encounter: Payer: Self-pay | Admitting: Family Medicine

## 2016-07-20 VITALS — BP 112/72 | HR 81 | Temp 98.1°F | Resp 16 | Ht 63.0 in | Wt 122.0 lb

## 2016-07-20 DIAGNOSIS — F329 Major depressive disorder, single episode, unspecified: Secondary | ICD-10-CM

## 2016-07-20 DIAGNOSIS — D509 Iron deficiency anemia, unspecified: Secondary | ICD-10-CM

## 2016-07-20 DIAGNOSIS — M25512 Pain in left shoulder: Secondary | ICD-10-CM

## 2016-07-20 DIAGNOSIS — F419 Anxiety disorder, unspecified: Secondary | ICD-10-CM

## 2016-07-20 DIAGNOSIS — F418 Other specified anxiety disorders: Secondary | ICD-10-CM

## 2016-07-20 DIAGNOSIS — J0100 Acute maxillary sinusitis, unspecified: Secondary | ICD-10-CM | POA: Diagnosis not present

## 2016-07-20 LAB — CBC WITH DIFFERENTIAL/PLATELET
BASOS ABS: 0 10*3/uL (ref 0.0–0.1)
Basophils Relative: 0.5 % (ref 0.0–3.0)
EOS ABS: 0.1 10*3/uL (ref 0.0–0.7)
Eosinophils Relative: 1.2 % (ref 0.0–5.0)
HCT: 39.6 % (ref 36.0–46.0)
Hemoglobin: 12.4 g/dL (ref 12.0–15.0)
LYMPHS ABS: 1.6 10*3/uL (ref 0.7–4.0)
LYMPHS PCT: 18.6 % (ref 12.0–46.0)
MCHC: 31.3 g/dL (ref 30.0–36.0)
MCV: 72.7 fl — ABNORMAL LOW (ref 78.0–100.0)
MONO ABS: 0.5 10*3/uL (ref 0.1–1.0)
Monocytes Relative: 5.8 % (ref 3.0–12.0)
NEUTROS ABS: 6.3 10*3/uL (ref 1.4–7.7)
NEUTROS PCT: 73.9 % (ref 43.0–77.0)
PLATELETS: 366 10*3/uL (ref 150.0–400.0)
RBC: 5.45 Mil/uL — ABNORMAL HIGH (ref 3.87–5.11)
RDW: 26.8 % — ABNORMAL HIGH (ref 11.5–15.5)
WBC: 8.6 10*3/uL (ref 4.0–10.5)

## 2016-07-20 MED ORDER — AMOXICILLIN 875 MG PO TABS: 875.0000 mg | ORAL_TABLET | Freq: Two times a day (BID) | ORAL | 0 refills | Status: DC

## 2016-07-20 MED ORDER — PREDNISONE 10 MG PO TABS: ORAL_TABLET | ORAL | 0 refills | Status: DC

## 2016-07-20 NOTE — Assessment & Plan Note (Signed)
Pt reports sxs are better so she stopped taking medication.  Not interested in restarting at this time.  Will continue to follow.

## 2016-07-20 NOTE — Assessment & Plan Note (Signed)
Pt's sxs and PE consistent w/ infxn.  Start abx.  Reviewed supportive care and red flags that should prompt return.  Pt expressed understanding and is in agreement w/ plan.  

## 2016-07-20 NOTE — Assessment & Plan Note (Signed)
Chronic problem.  Pt is interested in stopping Iron if possible.  Will check CBC and determine next steps.  Will follow.

## 2016-07-20 NOTE — Assessment & Plan Note (Signed)
New.  Pt having TTP over head of bicep on L.  Start Pred taper.  If no improvement, will refer to ortho.  Reviewed supportive care and red flags that should prompt return.  Pt expressed understanding and is in agreement w/ plan.

## 2016-07-20 NOTE — Patient Instructions (Signed)
Follow up as needed/scheduled We'll notify you of your lab results and make any changes if needed Start the Prednisone as directed- take all 3 pills at the same time (w/ food) for 3 days and then decrease to 2 pills at the same time, etc Start the Amoxicillin as directed- take w/ food- for the sinus infection Call with any questions or concerns Hang in there!!!

## 2016-07-20 NOTE — Progress Notes (Signed)
   Subjective:    Patient ID: Kaitlyn Adams, female    DOB: 1951/06/26, 65 y.o.   MRN: [id number]  HPI Anxiety- pt reports that anxiety is doing better so she stopped her Sertraline.  Pt is not interested in taking medication at this time  L shoulder pain- sxs started ~2 week ago.  Pain is constant.  Woke her from sleep recently.  No known injury and no change in activity level.  Pt has been taking Aleve w/ mild improvement but no resolution.  Pain will radiate down L arm.  Pain is worse w/ abduction and overhead movement.  URI- pt reports sxs started Sunday w/ cough.  + PND, hoarseness.  + frontal HA and sinus pressure.  + mild cough.  + sick contacts.  Anemia- last CBC showed improvement but counts were still low.  Pt would like to stop iron if possible.    Review of Systems For ROS see HPI     Objective:   Physical Exam  Constitutional: She appears well-developed and well-nourished. No distress.  HENT:  Head: Normocephalic and atraumatic.  Right Ear: Tympanic membrane normal.  Left Ear: Tympanic membrane normal.  Nose: Mucosal edema and rhinorrhea present. Right sinus exhibits maxillary sinus tenderness and frontal sinus tenderness. Left sinus exhibits maxillary sinus tenderness and frontal sinus tenderness.  Mouth/Throat: Uvula is midline and mucous membranes are normal. Posterior oropharyngeal erythema present. No oropharyngeal exudate.  Eyes: Conjunctivae and EOM are normal. Pupils are equal, round, and reactive to light.  Neck: Normal range of motion. Neck supple.  Cardiovascular: Normal rate, regular rhythm and normal heart sounds.   Pulmonary/Chest: Effort normal and breath sounds normal. No respiratory distress. She has no wheezes.  Musculoskeletal: She exhibits tenderness (TTP over head of L biceps tendon).  Pain w/ forward flexion >90, pain w/ internal rotation, abduction above 90 degrees  Lymphadenopathy:    She has no cervical adenopathy.  Vitals  reviewed.         Assessment & Plan:

## 2016-07-20 NOTE — Progress Notes (Signed)
Pre visit review using our clinic review tool, if applicable. No additional management support is needed unless otherwise documented below in the visit note. 

## 2016-08-25 ENCOUNTER — Ambulatory Visit: Payer: Medicare Other | Admitting: Cardiology

## 2016-08-26 NOTE — Progress Notes (Signed)
Cardiology Office Note Date:  08/29/2016  Patient ID:  Kaitlyn Adams, Kaitlyn Adams 12/14/1950, MRN [id number] PCP:  Annye Asa, MD  Cardiologist:  Dr. Martinique   Chief Complaint: Pafib  History of Present Illness: Kaitlyn Adams is a 66 y.o. female with history of paroxysmal atrial fibrillation, HTN, HLD, hyperthyroidism s/p thyroidectomy with subsequent hypothyroidism, asthma, GERD, fibromyalgia,  mild nonobstructive CAD who presents for follow-up.  She was admitted 9/22-9/23/16 with complaints of chest discomfort as well as symptoms of recurrent atrial fibrillation including dyspnea, weakness/fatigue, and palpitations with near-syncope. Labs were notable for significant hypokalemia of 2.5 which was repleted.  Troponins were negative. She underwent cath showing mild nonobstructive CAD with 30% dLAD, 40% D1, 25% pLCx, 30% mRCA, 30% RPDA, normal EF >65% with normal LVEDP.  Lasix was stopped due to her hypokalemia. Labs otherwise notable for LDL 140. She had a 30-day event monitor which showed some episodes of PAT. No definite AFib. She was started on Xarelto for [facility] of 3-4 (HTN, age, female, mild vascular disease). She was also started on atorvastatin. 2D echo 05/15/15: EF [id number], normal diastolic function. F/u labs 9/26 showed digoxin level 1.1 (digoxin decreased to 0.125mg  daily).  On follow up she reports episodes of tachycardia and fluttering increased recently. 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 Her symptoms of palpitations have been worse in the past with stress.  She is avoiding caffeine. She otherwise feels healthy.    Past Medical History:  Diagnosis Date  . Anemia   . Asthma    - normal spirometry 2009 FEV1  >90% predicted.  - methacoline challenge neg 11/09  . Bronchitis    recurrent  . Cataract   . Chronic cough    - sinus CT 03-23-10 > neg.  - allergy  profile 03-23-10 > nl, IgE 14.  - flutter valve rx 03-23-10  . Fibromyalgia   . GERD (gastroesophageal reflux disease)    exacerbating VCD  . Glaucoma   . Hyperlipidemia   . Hypertension   . Hyperthyroidism    with hot nodule.  - total thyroidectomy 11-05-08 benign -> subsequent hypothyroidism.  Marland Kitchen Hypokalemia   . Hypothyroidism   . Hypothyroidism    a. following thyroidectomy.  . Mild CAD    a. LHC 04/2015 - mild nonobstructive CAD with 30% dLAD, 40% D1, 25% pLCx, 30% mRCA, 30% RPDA, normal EF >65% with normal LVEDP.  . Orthostatic hypotension   . Osteoarthritis   . Paroxysmal atrial fibrillation (HCC)   . Renal insufficiency   . Vasomotor rhinitis    exacerbates VCD  . Vocal cord dysfunction    proven on FOB 9/09    Past Surgical History:  Procedure Laterality Date  . ABDOMINAL HYSTERECTOMY  1980  . ABDOMINAL HYSTERECTOMY    . BASAL CELL CARCINOMA EXCISION  11/08  . CARDIAC CATHETERIZATION N/A 05/15/2015   Procedure: Left Heart Cath and Coronary Angiography;  Surgeon: Sherren Mocha, MD;  Location: [facility];  Service: Cardiovascular;  Laterality: N/A;  . EYE SURGERY    . scar tissue removal  1982  . THYROID SURGERY  2010  . THYROIDECTOMY  11-05-08    Current Outpatient Prescriptions  Medication Sig Dispense Refill  . Acetaminophen (TYLENOL ARTHRITIS PAIN PO) Take 325 mg by mouth as needed (FOR MILD PAIN).     Marland Kitchen ALPRAZolam (XANAX) 0.5 MG tablet Take 1 tablet (0.5 mg  total) by mouth 2 (two) times daily as needed for anxiety. 60 tablet 3  . amitriptyline (ELAVIL) 50 MG tablet Take 1 tablet (50 mg total) by mouth at bedtime. 90 tablet 1  . atorvastatin (LIPITOR) 20 MG tablet TAKE 1 TABLET(20 MG) BY MOUTH DAILY 90 tablet 0  . digoxin (LANOXIN) 0.125 MG tablet Take 1 tablet (0.125 mg total) by mouth daily. 90 tablet 1  . Ketotifen Fumarate (THERATEARS ALLERGY OP) Apply 1 drop to eye daily. EACH EYE    . latanoprost (XALATAN) 0.005 % ophthalmic solution Place 1 drop into  both eyes at bedtime. 2.5 mL 6  . levothyroxine (SYNTHROID, LEVOTHROID) 75 MCG tablet Take 1 tablet (75 mcg total) by mouth daily. 90 tablet 1  . magnesium oxide (MAG-OX) 400 MG tablet TAKE 1 TABLET(400 MG) BY MOUTH DAILY 30 tablet 6  . metoprolol succinate (TOPROL-XL) 25 MG 24 hr tablet Take 1 tablet (25 mg total) by mouth daily. 90 tablet 1  . omeprazole (PRILOSEC) 40 MG capsule TAKE 1 CAPSULE(40 MG) BY MOUTH DAILY 90 capsule 0  . spironolactone (ALDACTONE) 25 MG tablet Take 0.5 tablets (12.5 mg total) by mouth daily. 45 tablet 6  . XARELTO 20 MG TABS tablet TAKE 1 TABLET(20 MG) BY MOUTH DAILY 30 tablet 6   No current facility-administered medications for this visit.     Allergies:   Codeine; Erythromycin; Hydrocod polst-cpm polst er; Sulfonamide derivatives; Tramadol hcl; and Guaifenesin er   Social History:  The patient  reports that she has never smoked. She has never used smokeless tobacco. She reports that she drinks about 0.6 oz of alcohol per week . She reports that she does not use drugs.   Family History:  The patient's family history includes Asthma in her grandchild and mother; Emphysema in her father; Heart disease in her father and mother.  ROS:  Please see the history of present illness.  All other systems are reviewed and otherwise negative.   PHYSICAL EXAM:  VS:  BP 100/66 (BP Location: Left Arm, Patient Position: Sitting, Cuff Size: Normal)   Pulse 90   Ht 5\' 3"  (1.6 m)   Wt 125 lb (56.7 kg)   BMI 22.14 kg/m  BMI: Body mass index is 22.14 kg/m. Well nourished, well developed WF, in no acute distress  HEENT: normocephalic, atraumatic  Neck: no JVD, carotid bruits or masses Cardiac:  normal S1, S2; RRR; very soft SEM (noted on previous exams as well) Lungs:  clear to auscultation bilaterally, no wheezing, rhonchi or rales  Abd: soft, nontender, no hepatomegaly, + BS MS: no deformity or atrophy Ext: no edema, right foot in a boot. Skin: warm and dry, no  rash Neuro:  moves all extremities spontaneously, no focal abnormalities noted, follows commands Psych: euthymic mood, full affect   EKG: Is done today. NSR rate 91. Possible old septal infarct. Nonspecific ST abnormality c/w dig effect. I have personally reviewed and interpreted this study.   Recent Labs: 11/18/2015: TSH 2.32 05/18/2016: ALT 14 06/15/2016: BUN 11; Creatinine, Ser 1.09; Potassium 3.9; Sodium 139 07/20/2016: Hemoglobin 12.4; Platelets 366.0  05/18/2016: Cholesterol 129; HDL 36.90; LDL Cholesterol 55; Total CHOL/HDL Ratio 4; Triglycerides 189.0; VLDL 37.8   CrCl cannot be calculated (Patient's most recent lab result is older than the maximum 21 days allowed.).   Wt Readings from Last 3 Encounters:  08/29/16 125 lb (56.7 kg)  07/20/16 122 lb (55.3 kg)  06/29/16 119 lb 8 oz (54.2 kg)     Other studies  reviewed: Additional studies/records reviewed today include: summarized above  ASSESSMENT AND PLAN:  1. PAT with history of Pafib. On appropriate therapy with dig and metoprolol. Continue Xarelto. Avoid stressors when possible. Follow up in 6 months. If symptoms worsen could consider switch to diltiazem or AAD.  2. Essential HTN - well controlled now.  3. Hypokalemia - now repleted. 4. Hypothyroidism  5. Hyperlipidemia - good control on atorvastatin  6. Mild CAD - continue risk reduction.  7. Plans for EGD and colonoscopy. May come off Xarelto 48 hours prior.  Disposition: F/u with me 46 months  Current medicines are reviewed at length with the patient today.  The patient did not have any concerns regarding medicines.   Signed, Ivelise Castillo Martinique MD, [facility]   08/29/2016 10:43 AM     [facility] [address], [place], [place] [address] Phone: [phone]

## 2016-08-29 ENCOUNTER — Ambulatory Visit (INDEPENDENT_AMBULATORY_CARE_PROVIDER_SITE_OTHER): Payer: Medicare Other | Admitting: Cardiology

## 2016-08-29 ENCOUNTER — Encounter: Payer: Self-pay | Admitting: Cardiology

## 2016-08-29 VITALS — BP 100/66 | HR 90 | Ht 63.0 in | Wt 125.0 lb

## 2016-08-29 DIAGNOSIS — E7800 Pure hypercholesterolemia, unspecified: Secondary | ICD-10-CM | POA: Diagnosis not present

## 2016-08-29 DIAGNOSIS — I10 Essential (primary) hypertension: Secondary | ICD-10-CM

## 2016-08-29 DIAGNOSIS — I480 Paroxysmal atrial fibrillation: Secondary | ICD-10-CM

## 2016-08-29 NOTE — Patient Instructions (Signed)
Continue your current therapy  I will see you in 6 months.   

## 2016-09-06 ENCOUNTER — Telehealth: Payer: Self-pay | Admitting: Gastroenterology

## 2016-09-07 ENCOUNTER — Encounter: Payer: Medicare Other | Admitting: Gastroenterology

## 2016-09-08 ENCOUNTER — Other Ambulatory Visit: Payer: Self-pay | Admitting: Family Medicine

## 2016-09-15 ENCOUNTER — Other Ambulatory Visit: Payer: Self-pay | Admitting: Family Medicine

## 2016-10-15 ENCOUNTER — Other Ambulatory Visit: Payer: Self-pay | Admitting: Family Medicine

## 2016-11-17 DIAGNOSIS — H40013 Open angle with borderline findings, low risk, bilateral: Secondary | ICD-10-CM | POA: Diagnosis not present

## 2016-11-25 ENCOUNTER — Encounter: Payer: Self-pay | Admitting: Physician Assistant

## 2016-11-25 ENCOUNTER — Ambulatory Visit (INDEPENDENT_AMBULATORY_CARE_PROVIDER_SITE_OTHER): Payer: Medicare Other | Admitting: Physician Assistant

## 2016-11-25 VITALS — BP 102/80 | HR 96 | Temp 98.1°F | Resp 14 | Ht 63.0 in | Wt 113.0 lb

## 2016-11-25 DIAGNOSIS — B9789 Other viral agents as the cause of diseases classified elsewhere: Secondary | ICD-10-CM

## 2016-11-25 DIAGNOSIS — J069 Acute upper respiratory infection, unspecified: Secondary | ICD-10-CM

## 2016-11-25 LAB — POCT INFLUENZA A/B
Influenza A, POC: NEGATIVE
Influenza B, POC: NEGATIVE

## 2016-11-25 MED ORDER — BENZONATATE 100 MG PO CAPS: 100.0000 mg | ORAL_CAPSULE | Freq: Three times a day (TID) | ORAL | 0 refills | Status: DC | PRN

## 2016-11-25 NOTE — Patient Instructions (Addendum)
Please stay well hydrated and get plenty of rest. Start plain Mucinex. Use the Tessalon as directed for cough. Place a humidifier in the bedroom.  Tylenol if needed for aches. Symptoms should continue to improve.  If you note any chest pain or true shortness of breath or any significant worsening of symptoms, please go to the ER.   Viral Respiratory Infection A viral respiratory infection is an illness that affects parts of the body used for breathing, like the lungs, nose, and throat. It is caused by a germ called a virus. Some examples of this kind of infection are:  A cold.  The flu (influenza).  A respiratory syncytial virus (RSV) infection. How do I know if I have this infection? Most of the time this infection causes:  A stuffy or runny nose.  Yellow or green fluid in the nose.  A cough.  Sneezing.  Tiredness (fatigue).  Achy muscles.  A sore throat.  Sweating or chills.  A fever.  A headache. How is this infection treated? If the flu is diagnosed early, it may be treated with an antiviral medicine. This medicine shortens the length of time a person has symptoms. Symptoms may be treated with over-the-counter and prescription medicines, such as:  Expectorants. These make it easier to cough up mucus.  Decongestant nasal sprays. Doctors do not prescribe antibiotic medicines for viral infections. They do not work with this kind of infection. How do I know if I should stay home? To keep others from getting sick, stay home if you have:  A fever.  A lasting cough.  A sore throat.  A runny nose.  Sneezing.  Muscles aches.  Headaches.  Tiredness.  Weakness.  Chills.  Sweating.  An upset stomach (nausea). Follow these instructions at home:  Rest as much as possible.  Take over-the-counter and prescription medicines only as told by your doctor.  Drink enough fluid to keep your pee (urine) clear or pale yellow.  Gargle with salt water. Do  this 3-4 times per day or as needed. To make a salt-water mixture, dissolve -1 tsp of salt in 1 cup of warm water. Make sure the salt dissolves all the way.  Use nose drops made from salt water. This helps with stuffiness (congestion). It also helps soften the skin around your nose.  Do not drink alcohol.  Do not use tobacco products, including cigarettes, chewing tobacco, and e-cigarettes. If you need help quitting, ask your doctor. Get help if:  Your symptoms last for 10 days or longer.  Your symptoms get worse over time.  You have a fever.  You have very bad pain in your face or forehead.  Parts of your jaw or neck become very swollen. Get help right away if:  You feel pain or pressure in your chest.  You have shortness of breath.  You faint or feel like you will faint.  You keep throwing up (vomiting).  You feel confused. This information is not intended to replace advice given to you by your health care provider. Make sure you discuss any questions you have with your health care provider. Document Released: 07/21/2008 Document Revised: 01/14/2016 Document Reviewed: 01/14/2015 Elsevier Interactive Patient Education  2017 [employer].

## 2016-11-25 NOTE — Progress Notes (Signed)
Patient presents to clinic today c/o sudden onset of dry cough, aches and significant chills. Also noted intermittent headache. Notes rhinorrhea and scratchy throat. Denies fever. Denies recent travel. Endorses a close co-worker with the flu. Denies having her flu shot. Notes chest tightness at night. Denies chest pain or SOB. Is not a smoker.   Past Medical History:  Diagnosis Date  . Anemia   . Asthma    - normal spirometry 2009 FEV1  >90% predicted.  - methacoline challenge neg 11/09  . Bronchitis    recurrent  . Cataract   . Chronic cough    - sinus CT 03-23-10 > neg.  - allergy profile 03-23-10 > nl, IgE 14.  - flutter valve rx 03-23-10  . Fibromyalgia   . GERD (gastroesophageal reflux disease)    exacerbating VCD  . Glaucoma   . Hyperlipidemia   . Hypertension   . Hyperthyroidism    with hot nodule.  - total thyroidectomy 11-05-08 benign -> subsequent hypothyroidism.  Marland Kitchen Hypokalemia   . Hypothyroidism   . Hypothyroidism    a. following thyroidectomy.  . Mild CAD    a. LHC 04/2015 - mild nonobstructive CAD with 30% dLAD, 40% D1, 25% pLCx, 30% mRCA, 30% RPDA, normal EF >65% with normal LVEDP.  . Orthostatic hypotension   . Osteoarthritis   . Paroxysmal atrial fibrillation (HCC)   . Renal insufficiency   . Vasomotor rhinitis    exacerbates VCD  . Vocal cord dysfunction    proven on FOB 9/09    Current Outpatient Prescriptions on File Prior to Visit  Medication Sig Dispense Refill  . Acetaminophen (TYLENOL ARTHRITIS PAIN PO) Take 325 mg by mouth as needed (FOR MILD PAIN).     Marland Kitchen ALPRAZolam (XANAX) 0.5 MG tablet Take 1 tablet (0.5 mg total) by mouth 2 (two) times daily as needed for anxiety. 60 tablet 3  . amitriptyline (ELAVIL) 50 MG tablet Take 1 tablet (50 mg total) by mouth at bedtime. 90 tablet 1  . atorvastatin (LIPITOR) 20 MG tablet TAKE 1 TABLET(20 MG) BY MOUTH DAILY 90 tablet 0  . digoxin (LANOXIN) 0.125 MG tablet Take 1 tablet (0.125 mg total) by mouth daily. 90 tablet  1  . Ketotifen Fumarate (THERATEARS ALLERGY OP) Apply 1 drop to eye daily. EACH EYE    . levothyroxine (SYNTHROID, LEVOTHROID) 75 MCG tablet Take 1 tablet (75 mcg total) by mouth daily. 90 tablet 1  . magnesium oxide (MAG-OX) 400 MG tablet TAKE 1 TABLET(400 MG) BY MOUTH DAILY 30 tablet 6  . metoprolol succinate (TOPROL-XL) 25 MG 24 hr tablet TAKE 1 TABLET(25 MG) BY MOUTH DAILY 90 tablet 0  . omeprazole (PRILOSEC) 40 MG capsule TAKE 1 CAPSULE(40 MG) BY MOUTH DAILY 90 capsule 0  . spironolactone (ALDACTONE) 25 MG tablet Take 0.5 tablets (12.5 mg total) by mouth daily. 45 tablet 6  . XARELTO 20 MG TABS tablet TAKE 1 TABLET(20 MG) BY MOUTH DAILY 30 tablet 6  . latanoprost (XALATAN) 0.005 % ophthalmic solution Place 1 drop into both eyes at bedtime. (Patient not taking: Reported on 11/25/2016) 2.5 mL 6   No current facility-administered medications on file prior to visit.     Allergies  Allergen Reactions  . Codeine     REACTION: itching  . Erythromycin     REACTION: nausea and vomiting  . Hydrocod Polst-Cpm Polst Er Other (See Comments)    Unknown reaction  . Sulfonamide Derivatives     REACTION: nausea and vomiting  .  Tramadol Hcl     REACTION: itching  . Guaifenesin Er Palpitations    Family History  Problem Relation Age of Onset  . Asthma Mother   . Heart disease Mother   . Emphysema Father   . Heart disease Father   . Asthma Grandchild     Social History   Social History  . Marital status: Married    Spouse name: N/A  . Number of children: 1  . Years of education: N/A   Occupational History  . 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 Smoking status: Never Smoker  . Smokeless tobacco: Never Used  . Alcohol use 0.6 oz/week    1 Glasses of wine per week     Comment: occasional wine or beer  . Drug use: No  . Sexual activity: Yes    Birth control/ protection: None   Other Topics Concern  . None   Social History  Narrative  . None   Review of Systems - See HPI.  All other ROS are negative.  BP 102/80   Pulse 96   Temp 98.1 F (36.7 C) (Oral)   Resp 14   Ht 5\' 3"  (1.6 m)   Wt 113 lb (51.3 kg)   SpO2 99%   BMI 20.02 kg/m   Physical Exam  Constitutional: She is oriented to person, place, and time and well-developed, well-nourished, and in no distress.  HENT:  Head: Normocephalic and atraumatic.  Right Ear: External ear normal.  Left Ear: External ear normal.  Nose: Nose normal.  Mouth/Throat: Oropharynx is clear and moist.  TM within normal limits bilaterally.  Eyes: Conjunctivae are normal.  Neck: Neck supple.  Cardiovascular: Normal rate, regular rhythm, normal heart sounds and intact distal pulses.   Pulmonary/Chest: Effort normal and breath sounds normal. No respiratory distress. She has no wheezes. She has no rales. She exhibits no tenderness.  Lymphadenopathy:    She has no cervical adenopathy.  Neurological: She is alert and oriented to person, place, and time.  Skin: Skin is warm and dry. No rash noted.  Psychiatric: Affect normal.  Vitals reviewed.  Assessment/Plan: 1. Viral URI with cough Flu swab negative. Afebrile. Vitals stable. Lungs CTAB. Discussed supportive measures and OTC medications. Rx Tessalon. Alarm signs/symptoms reviewed that would prompt ER assessment. Patient voices understanding and agreement with the plan.  - POCT Influenza A/B   Leeanne Rio, PA-C

## 2016-11-25 NOTE — Progress Notes (Signed)
Pre visit review using our clinic review tool, if applicable. No additional management support is needed unless otherwise documented below in the visit note. 

## 2016-12-12 ENCOUNTER — Other Ambulatory Visit: Payer: Self-pay | Admitting: Family Medicine

## 2016-12-12 DIAGNOSIS — E876 Hypokalemia: Secondary | ICD-10-CM

## 2016-12-13 ENCOUNTER — Other Ambulatory Visit: Payer: Self-pay | Admitting: Pharmacist Clinician (PhC)/ Clinical Pharmacy Specialist

## 2016-12-13 MED ORDER — RIVAROXABAN 15 MG PO TABS: 15.0000 mg | ORAL_TABLET | Freq: Every day | ORAL | 1 refills | Status: DC

## 2016-12-13 NOTE — Telephone Encounter (Signed)
SCr 1.07, Wt 56.7 kg, age 66  = CrCl 44.83  Will decrease dose to 15 mg daily, called patient to explain dosing change

## 2017-01-10 DIAGNOSIS — H40013 Open angle with borderline findings, low risk, bilateral: Secondary | ICD-10-CM | POA: Diagnosis not present

## 2017-01-12 ENCOUNTER — Other Ambulatory Visit: Payer: Self-pay | Admitting: Family Medicine

## 2017-03-04 NOTE — Progress Notes (Signed)
Cardiology Office Note Date:  03/09/2017  Patient ID:  Kaitlyn Adams [birth year], MRN [id number] PCP:  Midge Minium, MD  Cardiologist:  Dr. Martinique   Chief Complaint: Pafib  History of Present Illness: Kaitlyn Adams is a 65 y.o. female with history of paroxysmal atrial fibrillation, HTN, HLD, hyperthyroidism s/p thyroidectomy with subsequent hypothyroidism, asthma, GERD, fibromyalgia,  mild nonobstructive CAD who presents for follow-up.  She was admitted 9/22-9/23/16 with complaints of chest discomfort as well as symptoms of recurrent atrial fibrillation including dyspnea, weakness/fatigue, and palpitations with near-syncope. Labs were notable for significant hypokalemia of 2.5 which was repleted.  Troponins were negative. She underwent cath showing mild nonobstructive CAD with 30% dLAD, 40% D1, 25% pLCx, 30% mRCA, 30% RPDA, normal EF >65% with normal LVEDP.  Lasix was stopped due to her hypokalemia. Labs otherwise notable for LDL 140. She had a 30-day event monitor which showed some episodes of PAT. No definite AFib. She was started on Xarelto for [facility] of 3-4 (HTN, age, female, mild vascular disease). She was also started on atorvastatin. 2D echo 05/15/15: EF 84-69%, normal diastolic function. F/u labs 9/26 showed digoxin level 1.1 (digoxin decreased to 0.125mg  daily).  On follow up she reports symptoms of marked fatigue and increased dyspnea. This is worse with exertion and sometimes she can barely walk across the room. Notes heart is irregular at times. No fever, chills, intermittent cough. Voice is hoarse. 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 No edema. No chest pain.   Past Medical History:  Diagnosis Date  . Anemia   . Asthma    - normal spirometry 2009 FEV1  >90% predicted.  - methacoline challenge neg 11/09  . Bronchitis    recurrent  . Cataract   . Chronic cough    - sinus CT 03-23-10 > neg.  - allergy profile 03-23-10 > nl, IgE 14.  - flutter  valve rx 03-23-10  . Fibromyalgia   . GERD (gastroesophageal reflux disease)    exacerbating VCD  . Glaucoma   . Hyperlipidemia   . Hypertension   . Hyperthyroidism    with hot nodule.  - total thyroidectomy 11-05-08 benign -> subsequent hypothyroidism.  Marland Kitchen Hypokalemia   . Hypothyroidism   . Hypothyroidism    a. following thyroidectomy.  . Mild CAD    a. LHC 04/2015 - mild nonobstructive CAD with 30% dLAD, 40% D1, 25% pLCx, 30% mRCA, 30% RPDA, normal EF >65% with normal LVEDP.  . Orthostatic hypotension   . Osteoarthritis   . Paroxysmal atrial fibrillation (HCC)   . Renal insufficiency   . Vasomotor rhinitis    exacerbates VCD  . Vocal cord dysfunction    proven on FOB 9/09    Past Surgical History:  Procedure Laterality Date  . ABDOMINAL HYSTERECTOMY  1980  . ABDOMINAL HYSTERECTOMY    . BASAL CELL CARCINOMA EXCISION  11/08  . CARDIAC CATHETERIZATION N/A 05/15/2015   Procedure: Left Heart Cath and Coronary Angiography;  Surgeon: Sherren Mocha, MD;  Location: [facility];  Service: Cardiovascular;  Laterality: N/A;  . EYE SURGERY    . scar tissue removal  1982  . THYROID SURGERY  2010  . THYROIDECTOMY  11-05-08    Current Outpatient Prescriptions  Medication Sig Dispense Refill  . Acetaminophen (TYLENOL ARTHRITIS PAIN PO) Take 325 mg by mouth as needed (FOR MILD PAIN).     Marland Kitchen ALPRAZolam (XANAX) 0.5 MG tablet Take 1 tablet (0.5 mg total) by mouth  2 (two) times daily as needed for anxiety. 60 tablet 3  . amitriptyline (ELAVIL) 50 MG tablet TAKE 1 TABLET(50 MG) BY MOUTH AT BEDTIME 90 tablet 0  . atorvastatin (LIPITOR) 20 MG tablet TAKE 1 TABLET(20 MG) BY MOUTH DAILY 90 tablet 0  . DIGOX 125 MCG tablet TAKE 1 TABLET(0.125 MG) BY MOUTH DAILY 90 tablet 0  . levothyroxine (SYNTHROID, LEVOTHROID) 75 MCG tablet Take 1 tablet (75 mcg total) by mouth daily. 90 tablet 1  . magnesium oxide (MAG-OX) 400 MG tablet TAKE 1 TABLET(400 MG) BY MOUTH DAILY 30 tablet 6  . metoprolol succinate  (TOPROL-XL) 25 MG 24 hr tablet TAKE 1 TABLET(25 MG) BY MOUTH DAILY 90 tablet 0  . omeprazole (PRILOSEC) 40 MG capsule TAKE 1 CAPSULE(40 MG) BY MOUTH DAILY 90 capsule 0  . Rivaroxaban (XARELTO) 15 MG TABS tablet Take 1 tablet (15 mg total) by mouth daily with supper. 90 tablet 1  . spironolactone (ALDACTONE) 25 MG tablet TAKE 1/2 TABLET(12.5 MG) BY MOUTH DAILY 45 tablet 0   No current facility-administered medications for this visit.     Allergies:   Codeine; Erythromycin; Guaifenesin er; Hydrocod polst-cpm polst er; Sulfonamide derivatives; and Tramadol hcl   Social History:  The patient  reports that she has never smoked. She has never used smokeless tobacco. She reports that she drinks about 0.6 oz of alcohol per week . She reports that she does not use drugs.   Family History:  The patient's family history includes Asthma in her grandchild and mother; Emphysema in her father; Heart disease in her father and mother.  ROS:  Please see the history of present illness.  All other systems are reviewed and otherwise negative.   PHYSICAL EXAM:  VS:  BP (!) 100/58   Pulse 76   Ht 5\' 3"  (1.6 m)   Wt 114 lb (51.7 kg)   BMI 20.19 kg/m  BMI: Body mass index is 20.19 kg/m. Thin, frial WF, in no acute distress  HEENT: normocephalic, atraumatic, voice is soft and hoarse Neck: no JVD, carotid bruits or masses Cardiac:  normal S1, S2; RRR; soft SEM 2/6 RUSB Lungs:  clear to auscultation bilaterally, no wheezing, rhonchi or rales  Abd: soft, nontender, no hepatomegaly, + BS MS: no deformity or atrophy Ext: no edema, right foot in a boot. Skin: warm and dry, no rash Neuro:  moves all extremities spontaneously, no focal abnormalities noted, follows commands Psych: euthymic mood, full affect   EKG: Is not done today.    Recent Labs: 05/18/2016: ALT 14 06/15/2016: BUN 11; Creatinine, Ser 1.09; Potassium 3.9; Sodium 139 07/20/2016: Hemoglobin 12.4; Platelets 366.0  05/18/2016: Cholesterol 129;  HDL 36.90; LDL Cholesterol 55; Total CHOL/HDL Ratio 4; Triglycerides 189.0; VLDL 37.8   CrCl cannot be calculated (Patient's most recent lab result is older than the maximum 21 days allowed.).   Wt Readings from Last 3 Encounters:  03/09/17 114 lb (51.7 kg)  11/25/16 113 lb (51.3 kg)  08/29/16 125 lb (56.7 kg)     Other studies reviewed: Additional studies/records reviewed today include: summarized above  ASSESSMENT AND PLAN:  1. PAT with history of Pafib. On appropriate therapy with dig and metoprolol. Pulse is regular today. Continue Xarelto. Avoid stressors when possible. Follow up in 6 months. If symptoms worsen could consider switch to diltiazem or AAD.  2. Dyspnea- appears to be more related to frailty and asthma. No active infection. Will try a medrol dose pack to see if this will help. Encouraged  her to take time off work to rest.  3. Hypothyroidism  4. Hyperlipidemia - good control on atorvastatin  5. Mild CAD - continue risk reduction.   Disposition: F/u with me 6 months  Current medicines are reviewed at length with the patient today.  The patient did not have any concerns regarding medicines.   Signed, Akacia Boltz Martinique MD, [facility]   03/09/2017 8:13 AM     [facility] [address] [place], [place] [address] Phone: [phone]

## 2017-03-08 DIAGNOSIS — E559 Vitamin D deficiency, unspecified: Secondary | ICD-10-CM | POA: Diagnosis not present

## 2017-03-08 DIAGNOSIS — E538 Deficiency of other specified B group vitamins: Secondary | ICD-10-CM | POA: Diagnosis not present

## 2017-03-08 DIAGNOSIS — E039 Hypothyroidism, unspecified: Secondary | ICD-10-CM | POA: Diagnosis not present

## 2017-03-09 ENCOUNTER — Encounter: Payer: Self-pay | Admitting: Cardiology

## 2017-03-09 ENCOUNTER — Ambulatory Visit (INDEPENDENT_AMBULATORY_CARE_PROVIDER_SITE_OTHER): Payer: Medicare Other | Admitting: Cardiology

## 2017-03-09 ENCOUNTER — Other Ambulatory Visit: Payer: Self-pay

## 2017-03-09 VITALS — BP 100/58 | HR 76 | Ht 63.0 in | Wt 114.0 lb

## 2017-03-09 DIAGNOSIS — R0602 Shortness of breath: Secondary | ICD-10-CM | POA: Diagnosis not present

## 2017-03-09 DIAGNOSIS — I10 Essential (primary) hypertension: Secondary | ICD-10-CM

## 2017-03-09 DIAGNOSIS — I480 Paroxysmal atrial fibrillation: Secondary | ICD-10-CM

## 2017-03-09 DIAGNOSIS — E7800 Pure hypercholesterolemia, unspecified: Secondary | ICD-10-CM | POA: Diagnosis not present

## 2017-03-09 MED ORDER — PREDNISONE 10 MG (21) PO TBPK: ORAL_TABLET | ORAL | 0 refills | Status: DC

## 2017-03-09 NOTE — Patient Instructions (Signed)
We will prescribe a medrol dose pack (steroids) for your Shortness of breath.

## 2017-03-15 DIAGNOSIS — E039 Hypothyroidism, unspecified: Secondary | ICD-10-CM | POA: Diagnosis not present

## 2017-03-15 DIAGNOSIS — E559 Vitamin D deficiency, unspecified: Secondary | ICD-10-CM | POA: Diagnosis not present

## 2017-03-15 DIAGNOSIS — M899 Disorder of bone, unspecified: Secondary | ICD-10-CM | POA: Diagnosis not present

## 2017-03-15 DIAGNOSIS — M810 Age-related osteoporosis without current pathological fracture: Secondary | ICD-10-CM | POA: Diagnosis not present

## 2017-03-15 LAB — HM DEXA SCAN

## 2017-03-16 ENCOUNTER — Encounter: Payer: Self-pay | Admitting: General Practice

## 2017-03-16 ENCOUNTER — Other Ambulatory Visit: Payer: Self-pay | Admitting: Family Medicine

## 2017-03-16 DIAGNOSIS — E876 Hypokalemia: Secondary | ICD-10-CM

## 2017-03-16 NOTE — Telephone Encounter (Signed)
Medications filled and letter mailed to pt to inform need for appointment to recheck lipids with PCP. Rx for digoxin sent to Cardiology as this should be theirs to fill.

## 2017-03-20 ENCOUNTER — Other Ambulatory Visit: Payer: Self-pay | Admitting: General Practice

## 2017-03-20 MED ORDER — METOPROLOL SUCCINATE ER 25 MG PO TB24: ORAL_TABLET | ORAL | 0 refills | Status: DC

## 2017-03-21 ENCOUNTER — Ambulatory Visit (INDEPENDENT_AMBULATORY_CARE_PROVIDER_SITE_OTHER): Payer: Medicare Other | Admitting: Physician Assistant

## 2017-03-21 ENCOUNTER — Ambulatory Visit (INDEPENDENT_AMBULATORY_CARE_PROVIDER_SITE_OTHER): Payer: Medicare Other

## 2017-03-21 ENCOUNTER — Encounter: Payer: Self-pay | Admitting: Physician Assistant

## 2017-03-21 ENCOUNTER — Ambulatory Visit: Payer: Medicare Other | Admitting: Family Medicine

## 2017-03-21 VITALS — BP 122/72 | HR 76 | Temp 98.1°F | Resp 18 | Ht 64.0 in | Wt 112.1 lb

## 2017-03-21 DIAGNOSIS — J4540 Moderate persistent asthma, uncomplicated: Secondary | ICD-10-CM

## 2017-03-21 DIAGNOSIS — B9689 Other specified bacterial agents as the cause of diseases classified elsewhere: Secondary | ICD-10-CM | POA: Diagnosis not present

## 2017-03-21 DIAGNOSIS — J449 Chronic obstructive pulmonary disease, unspecified: Secondary | ICD-10-CM | POA: Diagnosis not present

## 2017-03-21 DIAGNOSIS — J208 Acute bronchitis due to other specified organisms: Secondary | ICD-10-CM

## 2017-03-21 DIAGNOSIS — R0602 Shortness of breath: Secondary | ICD-10-CM | POA: Diagnosis not present

## 2017-03-21 MED ORDER — BENZONATATE 100 MG PO CAPS: 100.0000 mg | ORAL_CAPSULE | Freq: Two times a day (BID) | ORAL | 0 refills | Status: DC | PRN

## 2017-03-21 MED ORDER — BUDESONIDE 90 MCG/ACT IN AEPB: 1.0000 | INHALATION_SPRAY | Freq: Two times a day (BID) | RESPIRATORY_TRACT | 0 refills | Status: DC

## 2017-03-21 MED ORDER — AMOXICILLIN 875 MG PO TABS: 875.0000 mg | ORAL_TABLET | Freq: Two times a day (BID) | ORAL | 0 refills | Status: DC

## 2017-03-21 NOTE — Progress Notes (Signed)
Patient presents to clinic today c/o 2 weeks of chest tightness and intermittent wheeze followed by chest congestion and cough starting over the past couple of days. Endorses cough is dry. Denies fever, chills but notes fatigue. Denies chest pain, racing heart. Some SOB with exertion since onset of symptoms. Denies recent travel, prolonged immobilization or surgical procedures. Has history of COPD but is not currently on any medication. States she cannot tolerate oral steroids and refuses to start any today. Has been taking OTC Delsym with some relief in symptoms.    Past Medical History:  Diagnosis Date  . Anemia   . Asthma    - normal spirometry 2009 FEV1  >90% predicted.  - methacoline challenge neg 11/09  . Bronchitis    recurrent  . Cataract   . Chronic cough    - sinus CT 03-23-10 > neg.  - allergy profile 03-23-10 > nl, IgE 14.  - flutter valve rx 03-23-10  . Fibromyalgia   . GERD (gastroesophageal reflux disease)    exacerbating VCD  . Glaucoma   . Hyperlipidemia   . Hypertension   . Hyperthyroidism    with hot nodule.  - total thyroidectomy 11-05-08 benign -> subsequent hypothyroidism.  Marland Kitchen Hypokalemia   . Hypothyroidism   . Hypothyroidism    a. following thyroidectomy.  . Mild CAD    a. LHC 04/2015 - mild nonobstructive CAD with 30% dLAD, 40% D1, 25% pLCx, 30% mRCA, 30% RPDA, normal EF >65% with normal LVEDP.  . Orthostatic hypotension   . Osteoarthritis   . Paroxysmal atrial fibrillation (HCC)   . Renal insufficiency   . Vasomotor rhinitis    exacerbates VCD  . Vocal cord dysfunction    proven on FOB 9/09    Current Outpatient Prescriptions on File Prior to Visit  Medication Sig Dispense Refill  . Acetaminophen (TYLENOL ARTHRITIS PAIN PO) Take 325 mg by mouth as needed (FOR MILD PAIN).     Marland Kitchen ALPRAZolam (XANAX) 0.5 MG tablet Take 1 tablet (0.5 mg total) by mouth 2 (two) times daily as needed for anxiety. 60 tablet 3  . amitriptyline (ELAVIL) 50 MG tablet TAKE 1 TABLET(50  MG) BY MOUTH AT BEDTIME 90 tablet 0  . atorvastatin (LIPITOR) 20 MG tablet TAKE 1 TABLET(20 MG) BY MOUTH DAILY 90 tablet 0  . DIGOX 125 MCG tablet TAKE 1 TABLET(0.125 MG) BY MOUTH DAILY 90 tablet 0  . levothyroxine (SYNTHROID, LEVOTHROID) 75 MCG tablet Take 1 tablet (75 mcg total) by mouth daily. 90 tablet 1  . metoprolol succinate (TOPROL-XL) 25 MG 24 hr tablet TAKE 1 TABLET(25 MG) BY MOUTH DAILY 90 tablet 0  . omeprazole (PRILOSEC) 40 MG capsule TAKE 1 CAPSULE(40 MG) BY MOUTH DAILY 90 capsule 0  . Rivaroxaban (XARELTO) 15 MG TABS tablet Take 1 tablet (15 mg total) by mouth daily with supper. 90 tablet 1  . spironolactone (ALDACTONE) 25 MG tablet TAKE 1/2 TABLET(12.5 MG) BY MOUTH DAILY 45 tablet 0   No current facility-administered medications on file prior to visit.     Allergies  Allergen Reactions  . Codeine Itching    REACTION: itching  . Erythromycin Nausea And Vomiting    REACTION: nausea and vomiting  . Guaifenesin Er Palpitations  . Hydrocod Polst-Cpm Polst Er Other (See Comments)    Unknown reaction  . Sulfonamide Derivatives Nausea And Vomiting    REACTION: nausea and vomiting  . Tramadol Hcl Itching    REACTION: itching    Family History  Problem  Relation Age of Onset  . Asthma Mother   . Heart disease Mother   . Emphysema Father   . Heart disease Father   . Asthma Grandchild     Social History   Social History  . Marital status: Married    Spouse name: N/A  . Number of children: 1  . Years of education: N/A   Occupational History  . 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 Smoking status: Never Smoker  . Smokeless tobacco: Never Used  . Alcohol use 0.6 oz/week    1 Glasses of wine per week     Comment: occasional wine or beer  . Drug use: No  . Sexual activity: Yes    Birth control/ protection: None   Other Topics Concern  . None   Social History Narrative  . None   Review of Systems - See  HPI.  All other ROS are negative.  BP 122/72   Pulse 76   Temp 98.1 F (36.7 C) (Oral)   Resp 18   Ht 5\' 4"  (1.626 m)   Wt 112 lb 2 oz (50.9 kg)   SpO2 96%   BMI 19.25 kg/m   Physical Exam  Constitutional: She is oriented to person, place, and time and well-developed, well-nourished, and in no distress.  HENT:  Head: Normocephalic and atraumatic.  Right Ear: External ear normal.  Left Ear: External ear normal.  Nose: Nose normal.  Mouth/Throat: Oropharynx is clear and moist. No oropharyngeal exudate.  TM within normal limits bilaterally.   Eyes: Conjunctivae are normal.  Neck: Neck supple.  Cardiovascular: Normal rate, regular rhythm, normal heart sounds and intact distal pulses.   Pulmonary/Chest: Effort normal. No respiratory distress. She has wheezes. She has no rales. She exhibits no tenderness.  Lymphadenopathy:    She has no cervical adenopathy.  Neurological: She is alert and oriented to person, place, and time.  Skin: Skin is warm and dry. No rash noted.  Psychiatric: Affect normal.  Vitals reviewed.  Assessment/Plan: 1. Acute bacterial bronchitis CXR today to further assess. Will start Amoxicillin giving chronic medical problems, current medications and allergies. Will alter if needed based on imaging results. Start Tessalon and [employer]. Supportive measures reviewed.  - DG Chest 2 View; Future  2. Moderate persistent asthma, unspecified whether complicated CXR today. Will start Pulmicort to help with chronic symptoms and mild exacerbation. Patient declines oral steroids. Trying to avoid SABA due to cardiac history. Referral to Pulmonology placed.  - DG Chest 2 View; Future   Leeanne Rio, PA-C

## 2017-03-21 NOTE — Progress Notes (Signed)
Pre visit review using our clinic review tool, if applicable. No additional management support is needed unless otherwise documented below in the visit note. 

## 2017-03-21 NOTE — Patient Instructions (Signed)
Please speak with the ladies up front for directions to get your chest x-ray. I will call with results.   Please stay well hydrated. Start the Pulmicort inhaler twice daily as directed. Delsym and the Tessalon as directed for cough.  Please take antibiotic as directed with food.  Follow-up with me in 1 month. If anything worsens, please go to the ER. We are setting you up with a Pulmonologist.

## 2017-03-23 ENCOUNTER — Encounter: Payer: Self-pay | Admitting: Physician Assistant

## 2017-03-23 ENCOUNTER — Ambulatory Visit (INDEPENDENT_AMBULATORY_CARE_PROVIDER_SITE_OTHER): Payer: Medicare Other | Admitting: Physician Assistant

## 2017-03-23 VITALS — BP 112/80 | HR 76 | Temp 97.9°F | Resp 18 | Ht 64.0 in | Wt 111.0 lb

## 2017-03-23 DIAGNOSIS — J849 Interstitial pulmonary disease, unspecified: Secondary | ICD-10-CM

## 2017-03-23 MED ORDER — AMOXICILLIN-POT CLAVULANATE 875-125 MG PO TABS: 1.0000 | ORAL_TABLET | Freq: Two times a day (BID) | ORAL | 0 refills | Status: DC

## 2017-03-23 NOTE — Patient Instructions (Addendum)
Please stay well-hydrated and get plenty of rest.  Stop the Amoxicillin and start the Augmentin as directed. Continue the Pulmicort changing to 2 puffs twice daily.   Since you are refusing steroids and other preferred antibiotics, there is not much more I can do. If symptoms are not improving today with these changes, I want you to go to the ER.

## 2017-03-23 NOTE — Progress Notes (Signed)
Pre visit review using our clinic review tool, if applicable. No additional management support is needed unless otherwise documented below in the visit note. 

## 2017-03-23 NOTE — Progress Notes (Signed)
Patient presents to clinic today for follow-up of bronchitis. Patient was seen a couple of days ago. Was started on Amoxicillin for acute bacterial bronchitis. Was also started on Pulmicort. X-ray was obtained revealing interstitial prominence consistent with acute bacterial bronchitis versus potential early interstitial pneumonia. Patient has started antibiotic as directed. Did not get or start Pulmicort until last night. Has only used once, noting mild improvement in breathing. Still endorses significant chest tightness without wheeze. Denies leg swelling, PND or orthopnea. Is taking all chronic medications as directed.   Past Medical History:  Diagnosis Date  . Anemia   . Asthma    - normal spirometry 2009 FEV1  >90% predicted.  - methacoline challenge neg 11/09  . Bronchitis    recurrent  . Cataract   . Chronic cough    - sinus CT 03-23-10 > neg.  - allergy profile 03-23-10 > nl, IgE 14.  - flutter valve rx 03-23-10  . Fibromyalgia   . GERD (gastroesophageal reflux disease)    exacerbating VCD  . Glaucoma   . Hyperlipidemia   . Hypertension   . Hyperthyroidism    with hot nodule.  - total thyroidectomy 11-05-08 benign -> subsequent hypothyroidism.  Marland Kitchen Hypokalemia   . Hypothyroidism   . Hypothyroidism    a. following thyroidectomy.  . Mild CAD    a. LHC 04/2015 - mild nonobstructive CAD with 30% dLAD, 40% D1, 25% pLCx, 30% mRCA, 30% RPDA, normal EF >65% with normal LVEDP.  . Orthostatic hypotension   . Osteoarthritis   . Paroxysmal atrial fibrillation (HCC)   . Renal insufficiency   . Vasomotor rhinitis    exacerbates VCD  . Vocal cord dysfunction    proven on FOB 9/09    Current Outpatient Prescriptions on File Prior to Visit  Medication Sig Dispense Refill  . Acetaminophen (TYLENOL ARTHRITIS PAIN PO) Take 325 mg by mouth as needed (FOR MILD PAIN).     Marland Kitchen ALPRAZolam (XANAX) 0.5 MG tablet Take 1 tablet (0.5 mg total) by mouth 2 (two) times daily as needed for anxiety. 60 tablet 3    . amitriptyline (ELAVIL) 50 MG tablet TAKE 1 TABLET(50 MG) BY MOUTH AT BEDTIME 90 tablet 0  . atorvastatin (LIPITOR) 20 MG tablet TAKE 1 TABLET(20 MG) BY MOUTH DAILY 90 tablet 0  . benzonatate (TESSALON) 100 MG capsule Take 1 capsule (100 mg total) by mouth 2 (two) times daily as needed for cough. 20 capsule 0  . Budesonide (PULMICORT FLEXHALER) 90 MCG/ACT inhaler Inhale 1 puff into the lungs 2 (two) times daily. 1 Inhaler 0  . DIGOX 125 MCG tablet TAKE 1 TABLET(0.125 MG) BY MOUTH DAILY 90 tablet 0  . levothyroxine (SYNTHROID, LEVOTHROID) 75 MCG tablet Take 1 tablet (75 mcg total) by mouth daily. 90 tablet 1  . metoprolol succinate (TOPROL-XL) 25 MG 24 hr tablet TAKE 1 TABLET(25 MG) BY MOUTH DAILY 90 tablet 0  . omeprazole (PRILOSEC) 40 MG capsule TAKE 1 CAPSULE(40 MG) BY MOUTH DAILY 90 capsule 0  . Rivaroxaban (XARELTO) 15 MG TABS tablet Take 1 tablet (15 mg total) by mouth daily with supper. 90 tablet 1  . spironolactone (ALDACTONE) 25 MG tablet TAKE 1/2 TABLET(12.5 MG) BY MOUTH DAILY 45 tablet 0  . Vitamin D, Ergocalciferol, (DRISDOL) 50000 units CAPS capsule Take 50,000 Units by mouth every 7 (seven) days.     No current facility-administered medications on file prior to visit.     Allergies  Allergen Reactions  . Codeine Itching  REACTION: itching  . Erythromycin Nausea And Vomiting    REACTION: nausea and vomiting  . Guaifenesin Er Palpitations  . Hydrocod Polst-Cpm Polst Er Other (See Comments)    Unknown reaction  . Sulfonamide Derivatives Nausea And Vomiting    REACTION: nausea and vomiting  . Tramadol Hcl Itching    REACTION: itching    Family History  Problem Relation Age of Onset  . Asthma Mother   . Heart disease Mother   . Emphysema Father   . Heart disease Father   . Asthma Grandchild     Social History   Social History  . Marital status: Married    Spouse name: N/A  . Number of children: 1  . Years of education: N/A   Occupational History  . owns a  plumbing business   . OWNER [employer]   Social History Main Topics  . Smoking status: Never Smoker  . Smokeless tobacco: Never Used  . Alcohol use 0.6 oz/week    1 Glasses of wine per week     Comment: occasional wine or beer  . Drug use: No  . Sexual activity: Yes    Birth control/ protection: None   Other Topics Concern  . None   Social History Narrative  . None   Review of Systems - See HPI.  All other ROS are negative.  BP 112/80   Pulse 76   Temp 97.9 F (36.6 C) (Oral)   Resp 18   Ht 5\' 4"  (1.626 m)   Wt 111 lb (50.3 kg)   SpO2 99%   BMI 19.05 kg/m   Physical Exam  Constitutional: She is oriented to person, place, and time and well-developed, well-nourished, and in no distress.  HENT:  Head: Normocephalic and atraumatic.  Right Ear: External ear normal.  Left Ear: External ear normal.  Nose: Nose normal.  Mouth/Throat: Oropharynx is clear and moist. No oropharyngeal exudate.  TM within normal limits bilaterally.  Eyes: Conjunctivae are normal.  Neck: Neck supple.  Cardiovascular: Normal rate, regular rhythm, normal heart sounds and intact distal pulses.   Pulmonary/Chest: Effort normal and breath sounds normal. No respiratory distress. She has no wheezes. She has no rales. She exhibits no tenderness.  Neurological: She is alert and oriented to person, place, and time.  Skin: Skin is warm and dry.  Psychiatric: Affect normal.  Vitals reviewed.  Assessment/Plan: 1. Interstitial pneumonia ([place]) Again CXR revealed acute bronchitis versus early interstitial pneumonia. Discussed antibiotic change to give better coverage. She refuses macrolide or Doxycycline. Is hesitant to take a FQ. Will change to Augmentin. Increase Pulmicort to two puffs BID as she will not allow oral steroids or SABA. Discussed there is a very small threshold for ER assessment since I am limited in what I am able to give her/she is willing to take. Patient voices understanding  and agreement with plan. ER if no improvement within 24-48 hours.  - amoxicillin-clavulanate (AUGMENTIN) 875-125 MG tablet; Take 1 tablet by mouth 2 (two) times daily.  Dispense: 14 tablet; Refill: 0   Leeanne Rio, [place]

## 2017-03-28 ENCOUNTER — Telehealth: Payer: Self-pay | Admitting: Physician Assistant

## 2017-03-28 NOTE — Telephone Encounter (Signed)
If symptoms are not improving we will likely need repeat CXR at this point to make sure there is no worsening.  At least a repeat lung exam. This will determine if she is likely to benefit from steroids at this point. At previous visits she has adamantly refused steroids due to stating they affect heart rate. Recommend reassessment.

## 2017-03-28 NOTE — Telephone Encounter (Signed)
Spoke with patient about current symptoms. She states her cough is improving but still having sob. She is taking the Augmentin and Pulmicort bid with minimum relief. Per Einar Pheasant to be reassessed to determine if she needs steroids at this point. She is agreeable. Scheduled with Dr Birdie Riddle on 03/29/17 @ 9:45

## 2017-03-28 NOTE — Telephone Encounter (Signed)
Patient called because she feels like she's not progressing like she should and she feels like it would be a good idea to go ahead and take the steroids. Patient uses the [facility] at [place] and [address]. Please advise.

## 2017-03-29 ENCOUNTER — Encounter: Payer: Self-pay | Admitting: Family Medicine

## 2017-03-29 ENCOUNTER — Ambulatory Visit (INDEPENDENT_AMBULATORY_CARE_PROVIDER_SITE_OTHER): Payer: Medicare Other | Admitting: Family Medicine

## 2017-03-29 VITALS — BP 104/71 | HR 70 | Temp 97.9°F | Resp 17 | Ht 64.0 in | Wt 113.1 lb

## 2017-03-29 DIAGNOSIS — J849 Interstitial pulmonary disease, unspecified: Secondary | ICD-10-CM

## 2017-03-29 DIAGNOSIS — R0602 Shortness of breath: Secondary | ICD-10-CM

## 2017-03-29 MED ORDER — IPRATROPIUM-ALBUTEROL 0.5-2.5 (3) MG/3ML IN SOLN
3.0000 mL | Freq: Once | RESPIRATORY_TRACT | Status: AC
  Administered 2017-03-29: 3 mL via RESPIRATORY_TRACT

## 2017-03-29 MED ORDER — AMOXICILLIN-POT CLAVULANATE 875-125 MG PO TABS: 1.0000 | ORAL_TABLET | Freq: Two times a day (BID) | ORAL | 0 refills | Status: DC

## 2017-03-29 MED ORDER — PREDNISONE 10 MG PO TABS: ORAL_TABLET | ORAL | 0 refills | Status: DC

## 2017-03-29 MED ORDER — ALBUTEROL SULFATE HFA 108 (90 BASE) MCG/ACT IN AERS: 2.0000 | INHALATION_SPRAY | Freq: Four times a day (QID) | RESPIRATORY_TRACT | 2 refills | Status: DC | PRN

## 2017-03-29 NOTE — Progress Notes (Signed)
   Subjective:    Patient ID: Kaitlyn Adams, female    DOB: 05/17/1951, 66 y.o.   MRN: [id number]  HPI PNA- pt was seen 7/31 and at that time CXR indicated bronchitis or early interstitial PNA.  Was reassessed on 8/2 and abx were switched to Augmentin b/c pt refused macrolide, Doxy, or FQ at that time.  Also refused oral steroids.  Pt reports she is not feeling better- 'too weak' and 'my breathing is not right'.  Afebrile today.  Pt reports SOB w/ minimal exertion.  Cough is improving.     Review of Systems For ROS see HPI     Objective:   Physical Exam  Constitutional: She is oriented to person, place, and time. She appears well-developed and well-nourished. No distress.  HENT:  Head: Normocephalic and atraumatic.  TMs normal bilaterally Mild nasal congestion Throat w/out erythema, edema, or exudate  Eyes: Pupils are equal, round, and reactive to light. Conjunctivae and EOM are normal.  Neck: Normal range of motion. Neck supple.  Cardiovascular: Normal rate, regular rhythm and intact distal pulses.   Murmur (III/VI SEM at RUSB) heard. Pulmonary/Chest: Effort normal. No respiratory distress. She has wheezes (bibasilar wheezes- improved s/p neb tx in office).  Lymphadenopathy:    She has no cervical adenopathy.  Neurological: She is alert and oriented to person, place, and time.  Skin: Skin is warm and dry.  Psychiatric: She has a normal mood and affect. Her behavior is normal. Thought content normal.  Vitals reviewed.         Assessment & Plan:  Interstitial PNA- reviewed xrays and previous office notes.  Pt's cough has improved but her SOB persists.  Wheezing improved s/p neb tx.  Pt given prescription for albuterol inhaler to use PRN.  Pt to start Prednisone taper given SOB and wheezing.  Extend Augmentin to 14 days.  Reviewed supportive care and red flags that should prompt return.  Pt expressed understanding and is in agreement w/ plan.

## 2017-03-29 NOTE — Progress Notes (Signed)
Pre visit review using our clinic review tool, if applicable. No additional management support is needed unless otherwise documented below in the visit note. 

## 2017-03-29 NOTE — Patient Instructions (Signed)
Follow up as needed/scheduled Extend the Augmentin for another 7 days- take w/ food (new prescription sent) Start the Prednisone as directed- 3 tabs at the same time x3 days, then 2 tabs x3 days, and then 1 tab x3 days (take w/ food) Use the albuterol inhaler- 2 puffs every 4-6 hrs as needed for shortness of breath, wheezing, cough, etc Call with any questions or concerns Hang in there! ENJOY YOUR TRIP!!!

## 2017-04-12 ENCOUNTER — Other Ambulatory Visit: Payer: Self-pay | Admitting: Family Medicine

## 2017-04-25 ENCOUNTER — Encounter: Payer: Self-pay | Admitting: Physician Assistant

## 2017-04-25 ENCOUNTER — Ambulatory Visit (INDEPENDENT_AMBULATORY_CARE_PROVIDER_SITE_OTHER): Payer: Medicare Other | Admitting: Physician Assistant

## 2017-04-25 ENCOUNTER — Ambulatory Visit (INDEPENDENT_AMBULATORY_CARE_PROVIDER_SITE_OTHER): Payer: Medicare Other

## 2017-04-25 ENCOUNTER — Encounter (HOSPITAL_COMMUNITY): Payer: Self-pay | Admitting: Emergency Medicine

## 2017-04-25 VITALS — BP 128/60 | HR 82 | Temp 98.6°F | Resp 14 | Ht 64.0 in | Wt 110.0 lb

## 2017-04-25 DIAGNOSIS — K5909 Other constipation: Secondary | ICD-10-CM | POA: Diagnosis present

## 2017-04-25 DIAGNOSIS — D649 Anemia, unspecified: Secondary | ICD-10-CM | POA: Diagnosis not present

## 2017-04-25 DIAGNOSIS — N183 Chronic kidney disease, stage 3 (moderate): Secondary | ICD-10-CM | POA: Diagnosis present

## 2017-04-25 DIAGNOSIS — R5383 Other fatigue: Secondary | ICD-10-CM

## 2017-04-25 DIAGNOSIS — D120 Benign neoplasm of cecum: Secondary | ICD-10-CM | POA: Diagnosis not present

## 2017-04-25 DIAGNOSIS — R9431 Abnormal electrocardiogram [ECG] [EKG]: Secondary | ICD-10-CM | POA: Diagnosis not present

## 2017-04-25 DIAGNOSIS — K449 Diaphragmatic hernia without obstruction or gangrene: Secondary | ICD-10-CM | POA: Diagnosis present

## 2017-04-25 DIAGNOSIS — E872 Acidosis: Secondary | ICD-10-CM | POA: Diagnosis present

## 2017-04-25 DIAGNOSIS — N179 Acute kidney failure, unspecified: Secondary | ICD-10-CM | POA: Diagnosis not present

## 2017-04-25 DIAGNOSIS — E785 Hyperlipidemia, unspecified: Secondary | ICD-10-CM | POA: Diagnosis present

## 2017-04-25 DIAGNOSIS — I2510 Atherosclerotic heart disease of native coronary artery without angina pectoris: Secondary | ICD-10-CM | POA: Diagnosis present

## 2017-04-25 DIAGNOSIS — R195 Other fecal abnormalities: Secondary | ICD-10-CM | POA: Diagnosis present

## 2017-04-25 DIAGNOSIS — B3781 Candidal esophagitis: Secondary | ICD-10-CM | POA: Diagnosis not present

## 2017-04-25 DIAGNOSIS — Z79899 Other long term (current) drug therapy: Secondary | ICD-10-CM

## 2017-04-25 DIAGNOSIS — E876 Hypokalemia: Secondary | ICD-10-CM | POA: Diagnosis present

## 2017-04-25 DIAGNOSIS — K5730 Diverticulosis of large intestine without perforation or abscess without bleeding: Secondary | ICD-10-CM | POA: Diagnosis present

## 2017-04-25 DIAGNOSIS — E860 Dehydration: Secondary | ICD-10-CM | POA: Diagnosis present

## 2017-04-25 DIAGNOSIS — Z681 Body mass index (BMI) 19 or less, adult: Secondary | ICD-10-CM

## 2017-04-25 DIAGNOSIS — R634 Abnormal weight loss: Secondary | ICD-10-CM | POA: Diagnosis present

## 2017-04-25 DIAGNOSIS — D509 Iron deficiency anemia, unspecified: Secondary | ICD-10-CM | POA: Diagnosis present

## 2017-04-25 DIAGNOSIS — D123 Benign neoplasm of transverse colon: Secondary | ICD-10-CM | POA: Diagnosis present

## 2017-04-25 DIAGNOSIS — Z7901 Long term (current) use of anticoagulants: Secondary | ICD-10-CM

## 2017-04-25 DIAGNOSIS — R0602 Shortness of breath: Secondary | ICD-10-CM | POA: Diagnosis not present

## 2017-04-25 DIAGNOSIS — K6389 Other specified diseases of intestine: Principal | ICD-10-CM | POA: Diagnosis present

## 2017-04-25 DIAGNOSIS — Z825 Family history of asthma and other chronic lower respiratory diseases: Secondary | ICD-10-CM

## 2017-04-25 DIAGNOSIS — F329 Major depressive disorder, single episode, unspecified: Secondary | ICD-10-CM | POA: Diagnosis present

## 2017-04-25 DIAGNOSIS — I129 Hypertensive chronic kidney disease with stage 1 through stage 4 chronic kidney disease, or unspecified chronic kidney disease: Secondary | ICD-10-CM | POA: Diagnosis present

## 2017-04-25 DIAGNOSIS — F419 Anxiety disorder, unspecified: Secondary | ICD-10-CM | POA: Diagnosis present

## 2017-04-25 DIAGNOSIS — Z85828 Personal history of other malignant neoplasm of skin: Secondary | ICD-10-CM

## 2017-04-25 DIAGNOSIS — I480 Paroxysmal atrial fibrillation: Secondary | ICD-10-CM | POA: Diagnosis present

## 2017-04-25 DIAGNOSIS — M797 Fibromyalgia: Secondary | ICD-10-CM | POA: Diagnosis present

## 2017-04-25 DIAGNOSIS — K219 Gastro-esophageal reflux disease without esophagitis: Secondary | ICD-10-CM | POA: Diagnosis present

## 2017-04-25 DIAGNOSIS — H409 Unspecified glaucoma: Secondary | ICD-10-CM | POA: Diagnosis present

## 2017-04-25 DIAGNOSIS — J45909 Unspecified asthma, uncomplicated: Secondary | ICD-10-CM | POA: Diagnosis present

## 2017-04-25 DIAGNOSIS — K648 Other hemorrhoids: Secondary | ICD-10-CM | POA: Diagnosis present

## 2017-04-25 DIAGNOSIS — Z8249 Family history of ischemic heart disease and other diseases of the circulatory system: Secondary | ICD-10-CM

## 2017-04-25 DIAGNOSIS — R0789 Other chest pain: Secondary | ICD-10-CM | POA: Diagnosis not present

## 2017-04-25 DIAGNOSIS — E890 Postprocedural hypothyroidism: Secondary | ICD-10-CM | POA: Diagnosis present

## 2017-04-25 LAB — CBC WITH DIFFERENTIAL/PLATELET
BASOS PCT: 0.7 % (ref 0.0–3.0)
Basophils Absolute: 0.1 10*3/uL (ref 0.0–0.1)
EOS ABS: 0.1 10*3/uL (ref 0.0–0.7)
Eosinophils Relative: 0.4 % (ref 0.0–5.0)
Hemoglobin: 5 g/dL — CL (ref 12.0–15.0)
LYMPHS ABS: 2.1 10*3/uL (ref 0.7–4.0)
Lymphocytes Relative: 18.1 % (ref 12.0–46.0)
MCHC: 27 g/dL — ABNORMAL LOW (ref 30.0–36.0)
MCV: 53.5 fl — ABNORMAL LOW (ref 78.0–100.0)
MONO ABS: 0.9 10*3/uL (ref 0.1–1.0)
Monocytes Relative: 7.8 % (ref 3.0–12.0)
NEUTROS ABS: 8.3 10*3/uL — AB (ref 1.4–7.7)
Neutrophils Relative %: 73 % (ref 43.0–77.0)
PLATELETS: 688 10*3/uL — AB (ref 150.0–400.0)
RBC: 3.46 Mil/uL — ABNORMAL LOW (ref 3.87–5.11)
RDW: 20.4 % — AB (ref 11.5–15.5)
WBC: 11.5 10*3/uL — ABNORMAL HIGH (ref 4.0–10.5)

## 2017-04-25 NOTE — Progress Notes (Signed)
Patient presents to clinic today c/o continued chest tightness and SOB with exertion along with worsening fatigue. Patient was diagnosed with CAP one month ago and treated with Augmentin. Noted improvement in respiratory symptoms and cough with medication but tightness, fatigue and SOB have continued. Patient notes some GERD symptoms despite current PPI use. Denies melena, hematochezia or tenesmus. Denies change in diet. Denies depressed mood or anhedonia.   Past Medical History:  Diagnosis Date  . Anemia   . Asthma    - normal spirometry 2009 FEV1  >90% predicted.  - methacoline challenge neg 11/09  . Bronchitis    recurrent  . Cataract   . Chronic cough    - sinus CT 03-23-10 > neg.  - allergy profile 03-23-10 > nl, IgE 14.  - flutter valve rx 03-23-10  . Fibromyalgia   . GERD (gastroesophageal reflux disease)    exacerbating VCD  . Glaucoma   . Hyperlipidemia   . Hypertension   . Hyperthyroidism    with hot nodule.  - total thyroidectomy 11-05-08 benign -> subsequent hypothyroidism.  Marland Kitchen Hypokalemia   . Hypothyroidism   . Hypothyroidism    a. following thyroidectomy.  . Mild CAD    a. LHC 04/2015 - mild nonobstructive CAD with 30% dLAD, 40% D1, 25% pLCx, 30% mRCA, 30% RPDA, normal EF >65% with normal LVEDP.  . Orthostatic hypotension   . Osteoarthritis   . Paroxysmal atrial fibrillation (HCC)   . Renal insufficiency   . Vasomotor rhinitis    exacerbates VCD  . Vocal cord dysfunction    proven on FOB 9/09    Current Outpatient Prescriptions on File Prior to Visit  Medication Sig Dispense Refill  . Acetaminophen (TYLENOL ARTHRITIS PAIN PO) Take 325 mg by mouth as needed (FOR MILD PAIN).     Marland Kitchen albuterol (PROVENTIL HFA;VENTOLIN HFA) 108 (90 Base) MCG/ACT inhaler Inhale 2 puffs into the lungs every 6 (six) hours as needed for wheezing or shortness of breath. 1 Inhaler 2  . ALPRAZolam (XANAX) 0.5 MG tablet Take 1 tablet (0.5 mg total) by mouth 2 (two) times daily as needed for  anxiety. 60 tablet 3  . amitriptyline (ELAVIL) 50 MG tablet TAKE 1 TABLET(50 MG) BY MOUTH AT BEDTIME 90 tablet 0  . atorvastatin (LIPITOR) 20 MG tablet TAKE 1 TABLET(20 MG) BY MOUTH DAILY 90 tablet 0  . DIGOX 125 MCG tablet TAKE 1 TABLET(0.125 MG) BY MOUTH DAILY 90 tablet 0  . levothyroxine (SYNTHROID, LEVOTHROID) 75 MCG tablet Take 1 tablet (75 mcg total) by mouth daily. 90 tablet 1  . metoprolol succinate (TOPROL-XL) 25 MG 24 hr tablet TAKE 1 TABLET(25 MG) BY MOUTH DAILY 90 tablet 0  . omeprazole (PRILOSEC) 40 MG capsule TAKE 1 CAPSULE(40 MG) BY MOUTH DAILY 90 capsule 0  . Rivaroxaban (XARELTO) 15 MG TABS tablet Take 1 tablet (15 mg total) by mouth daily with supper. 90 tablet 1  . spironolactone (ALDACTONE) 25 MG tablet TAKE 1/2 TABLET(12.5 MG) BY MOUTH DAILY 45 tablet 0  . Vitamin D, Ergocalciferol, (DRISDOL) 50000 units CAPS capsule Take 50,000 Units by mouth every 7 (seven) days.     No current facility-administered medications on file prior to visit.     Allergies  Allergen Reactions  . Codeine Itching    REACTION: itching  . Erythromycin Nausea And Vomiting    REACTION: nausea and vomiting  . Guaifenesin Er Palpitations  . Hydrocod Polst-Cpm Polst Er Other (See Comments)    Unknown reaction  .  Sulfonamide Derivatives Nausea And Vomiting    REACTION: nausea and vomiting  . Tramadol Hcl Itching    REACTION: itching    Family History  Problem Relation Age of Onset  . Asthma Mother   . Heart disease Mother   . Emphysema Father   . Heart disease Father   . Asthma Grandchild     Social History   Social History  . Marital status: Married    Spouse name: N/A  . Number of children: 1  . Years of education: N/A   Occupational History  . 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 Smoking status: Never Smoker  . Smokeless tobacco: Never Used  . Alcohol use 0.6 oz/week    1 Glasses of wine per week     Comment:  occasional wine or beer  . Drug use: No  . Sexual activity: Yes    Birth control/ protection: None   Other Topics Concern  . None   Social History Narrative  . None   Review of Systems - See HPI.  All other ROS are negative.  BP 128/60   Pulse 82   Temp 98.6 F (37 C) (Oral)   Resp 14   Ht 5\' 4"  (1.626 m)   Wt 110 lb (49.9 kg)   SpO2 99%   BMI 18.88 kg/m   Physical Exam  Constitutional: She is oriented to person, place, and time and well-developed, well-nourished, and in no distress.  HENT:  Head: Normocephalic and atraumatic.  Right Ear: External ear normal.  Left Ear: External ear normal.  Nose: Nose normal.  Mouth/Throat: Oropharynx is clear and moist. No oropharyngeal exudate.  Eyes: Conjunctivae are normal.  Neck: Neck supple.  Cardiovascular: Normal rate, regular rhythm, normal heart sounds and intact distal pulses.   Pulmonary/Chest: Effort normal and breath sounds normal. No respiratory distress. She has no wheezes. She has no rales. She exhibits no tenderness.  Lymphadenopathy:    She has no cervical adenopathy.  Neurological: She is alert and oriented to person, place, and time.  Skin: Skin is warm and dry. No rash noted.  Psychiatric: Affect normal.  Vitals reviewed.   Recent Results (from the past 2160 hour(s))  CBC w/Diff     Status: Abnormal   Collection Time: 04/25/17  3:17 PM  Result Value Ref Range   WBC 11.5 (H) 4.0 - 10.5 K/uL   RBC 3.46 (L) 3.87 - 5.11 Mil/uL   Hemoglobin 5.0 Repeated and verified X2. (LL) 12.0 - 15.0 g/dL   HCT 18.5 Repeated and verified X2. (LL) 36.0 - 46.0 %   MCV 53.5 (L) 78.0 - 100.0 fl   MCHC 27.0 (L) 30.0 - 36.0 g/dL   RDW 20.4 (H) 11.5 - 15.5 %   Platelets 688.0 (H) 150.0 - 400.0 K/uL   Neutrophils Relative % 73.0 43.0 - 77.0 %   Lymphocytes Relative 18.1 12.0 - 46.0 %   Monocytes Relative 7.8 3.0 - 12.0 %   Eosinophils Relative 0.4 0.0 - 5.0 %   Basophils Relative 0.7 0.0 - 3.0 %   Neutro Abs 8.3 (H) 1.4 - 7.7  K/uL   Lymphs Abs 2.1 0.7 - 4.0 K/uL   Monocytes Absolute 0.9 0.1 - 1.0 K/uL   Eosinophils Absolute 0.1 0.0 - 0.7 K/uL   Basophils Absolute 0.1 0.0 - 0.1 K/uL   Assessment/Plan: 1. SOBOE (shortness of breath on exertion) 2. Fatigue, unspecified type -  CBC w/Diff - DG Chest 2 View; Future  Suspect secondary to recent illness however further assessment is needed giving mention of worsening reflux symptoms despite medications. Will check CBC to assess for anemia and leukocytosis. Repeat CXR today STAT. Will hold off on treatment changes until results are in. CXR revealed no acute process. CBC revealed mild leukocytosis with significant anemia -- Hgb 5.0. Concern for bleeding ulcers as culprit. Patient send to ER with her husband for transfusion, further assessment and management.     Leeanne Rio, PA-C

## 2017-04-25 NOTE — ED Triage Notes (Signed)
Pt. Stated, I have been SOB with pneumonia and its caused me to be so weak. I went to doctor today and he said I need to come right here cause my blood was so low.  It was a 5. My hgb.  Ive had this SOB with bronchitis for 2 months

## 2017-04-25 NOTE — Progress Notes (Signed)
Pre visit review using our clinic review tool, if applicable. No additional management support is needed unless otherwise documented below in the visit note. 

## 2017-04-25 NOTE — Patient Instructions (Signed)
Please go to the lab for blood work. Go to our [place] location for repeat x-ray. I will call with results and we will determine next steps.

## 2017-04-26 ENCOUNTER — Inpatient Hospital Stay (HOSPITAL_COMMUNITY)
Admission: EM | Admit: 2017-04-26 | Discharge: 2017-04-28 | DRG: 394 | Disposition: A | Discharge status: Home / Self Care | Payer: Medicare Other | Attending: Internal Medicine | Admitting: Internal Medicine

## 2017-04-26 DIAGNOSIS — Z7901 Long term (current) use of anticoagulants: Secondary | ICD-10-CM

## 2017-04-26 DIAGNOSIS — N179 Acute kidney failure, unspecified: Secondary | ICD-10-CM | POA: Diagnosis not present

## 2017-04-26 DIAGNOSIS — K449 Diaphragmatic hernia without obstruction or gangrene: Secondary | ICD-10-CM | POA: Diagnosis not present

## 2017-04-26 DIAGNOSIS — E039 Hypothyroidism, unspecified: Secondary | ICD-10-CM | POA: Diagnosis not present

## 2017-04-26 DIAGNOSIS — E876 Hypokalemia: Secondary | ICD-10-CM | POA: Diagnosis not present

## 2017-04-26 DIAGNOSIS — Z681 Body mass index (BMI) 19 or less, adult: Secondary | ICD-10-CM | POA: Diagnosis not present

## 2017-04-26 DIAGNOSIS — R195 Other fecal abnormalities: Secondary | ICD-10-CM

## 2017-04-26 DIAGNOSIS — D123 Benign neoplasm of transverse colon: Secondary | ICD-10-CM

## 2017-04-26 DIAGNOSIS — I10 Essential (primary) hypertension: Secondary | ICD-10-CM | POA: Diagnosis present

## 2017-04-26 DIAGNOSIS — I480 Paroxysmal atrial fibrillation: Secondary | ICD-10-CM | POA: Diagnosis not present

## 2017-04-26 DIAGNOSIS — K219 Gastro-esophageal reflux disease without esophagitis: Secondary | ICD-10-CM

## 2017-04-26 DIAGNOSIS — I129 Hypertensive chronic kidney disease with stage 1 through stage 4 chronic kidney disease, or unspecified chronic kidney disease: Secondary | ICD-10-CM | POA: Diagnosis present

## 2017-04-26 DIAGNOSIS — D500 Iron deficiency anemia secondary to blood loss (chronic): Secondary | ICD-10-CM

## 2017-04-26 DIAGNOSIS — D649 Anemia, unspecified: Secondary | ICD-10-CM | POA: Diagnosis not present

## 2017-04-26 DIAGNOSIS — H409 Unspecified glaucoma: Secondary | ICD-10-CM | POA: Diagnosis present

## 2017-04-26 DIAGNOSIS — K5909 Other constipation: Secondary | ICD-10-CM | POA: Diagnosis not present

## 2017-04-26 DIAGNOSIS — E872 Acidosis: Secondary | ICD-10-CM | POA: Diagnosis not present

## 2017-04-26 DIAGNOSIS — K649 Unspecified hemorrhoids: Secondary | ICD-10-CM | POA: Diagnosis not present

## 2017-04-26 DIAGNOSIS — E860 Dehydration: Secondary | ICD-10-CM | POA: Diagnosis not present

## 2017-04-26 DIAGNOSIS — K209 Esophagitis, unspecified: Secondary | ICD-10-CM | POA: Diagnosis not present

## 2017-04-26 DIAGNOSIS — F419 Anxiety disorder, unspecified: Secondary | ICD-10-CM | POA: Diagnosis not present

## 2017-04-26 DIAGNOSIS — K6389 Other specified diseases of intestine: Secondary | ICD-10-CM | POA: Diagnosis not present

## 2017-04-26 DIAGNOSIS — E785 Hyperlipidemia, unspecified: Secondary | ICD-10-CM | POA: Diagnosis not present

## 2017-04-26 DIAGNOSIS — R630 Anorexia: Secondary | ICD-10-CM | POA: Diagnosis not present

## 2017-04-26 DIAGNOSIS — F329 Major depressive disorder, single episode, unspecified: Secondary | ICD-10-CM | POA: Diagnosis present

## 2017-04-26 DIAGNOSIS — I2510 Atherosclerotic heart disease of native coronary artery without angina pectoris: Secondary | ICD-10-CM | POA: Diagnosis present

## 2017-04-26 DIAGNOSIS — B3781 Candidal esophagitis: Secondary | ICD-10-CM | POA: Diagnosis not present

## 2017-04-26 DIAGNOSIS — K635 Polyp of colon: Secondary | ICD-10-CM

## 2017-04-26 DIAGNOSIS — Z86010 Personal history of colonic polyps: Secondary | ICD-10-CM | POA: Diagnosis not present

## 2017-04-26 DIAGNOSIS — E890 Postprocedural hypothyroidism: Secondary | ICD-10-CM | POA: Diagnosis present

## 2017-04-26 DIAGNOSIS — D120 Benign neoplasm of cecum: Secondary | ICD-10-CM | POA: Diagnosis not present

## 2017-04-26 DIAGNOSIS — R634 Abnormal weight loss: Secondary | ICD-10-CM | POA: Diagnosis not present

## 2017-04-26 DIAGNOSIS — D509 Iron deficiency anemia, unspecified: Secondary | ICD-10-CM

## 2017-04-26 DIAGNOSIS — K648 Other hemorrhoids: Secondary | ICD-10-CM | POA: Diagnosis not present

## 2017-04-26 DIAGNOSIS — N183 Chronic kidney disease, stage 3 (moderate): Secondary | ICD-10-CM | POA: Diagnosis present

## 2017-04-26 DIAGNOSIS — J45909 Unspecified asthma, uncomplicated: Secondary | ICD-10-CM | POA: Diagnosis present

## 2017-04-26 LAB — CBC
HCT: 30.1 % — ABNORMAL LOW (ref 36.0–46.0)
HCT: 46.2 % — ABNORMAL HIGH (ref 36.0–46.0)
HEMATOCRIT: 25.2 % — AB (ref 36.0–46.0)
HEMATOCRIT: 42.4 % (ref 36.0–46.0)
HEMOGLOBIN: 6.8 g/dL — AB (ref 12.0–15.0)
HEMOGLOBIN: 8.6 g/dL — AB (ref 12.0–15.0)
HEMOGLOBIN: 12.2 g/dL (ref 12.0–15.0)
Hemoglobin: 13.6 g/dL (ref 12.0–15.0)
MCH: 16.4 pg — AB (ref 26.0–34.0)
MCH: 18.4 pg — ABNORMAL LOW (ref 26.0–34.0)
MCH: 19 pg — ABNORMAL LOW (ref 26.0–34.0)
MCH: 18.9 pg — ABNORMAL LOW (ref 26.0–34.0)
MCHC: 27 g/dL — AB (ref 30.0–36.0)
MCHC: 28.6 g/dL — AB (ref 30.0–36.0)
MCHC: 29.4 g/dL — ABNORMAL LOW (ref 30.0–36.0)
MCHC: 28.8 g/dL — AB (ref 30.0–36.0)
MCV: 60.7 fL — ABNORMAL LOW (ref 78.0–100.0)
MCV: 64.5 fL — ABNORMAL LOW (ref 78.0–100.0)
MCV: 64.5 fL — AB (ref 78.0–100.0)
MCV: 65.7 fL — ABNORMAL LOW (ref 78.0–100.0)
PLATELETS: 555 10*3/uL — AB (ref 150–400)
Platelets: 575 10*3/uL — ABNORMAL HIGH (ref 150–400)
RBC: 4.15 MIL/uL (ref 3.87–5.11)
RBC: 4.67 MIL/uL (ref 3.87–5.11)
RBC: 7.16 MIL/uL — AB (ref 3.87–5.11)
RBC: 6.45 MIL/uL — AB (ref 3.87–5.11)
RDW: 25.3 % — AB (ref 11.5–15.5)
RDW: 27.7 % — ABNORMAL HIGH (ref 11.5–15.5)
RDW: 28.2 % — ABNORMAL HIGH (ref 11.5–15.5)
RDW: 28.1 % — ABNORMAL HIGH (ref 11.5–15.5)
WBC: 8.1 10*3/uL (ref 4.0–10.5)
WBC: 8.6 10*3/uL (ref 4.0–10.5)
WBC: 18.9 10*3/uL — AB (ref 4.0–10.5)
WBC: 19.1 10*3/uL — AB (ref 4.0–10.5)

## 2017-04-26 LAB — COMPREHENSIVE METABOLIC PANEL
ALT: 15 U/L (ref 14–54)
AST: 19 U/L (ref 15–41)
Albumin: 3.9 g/dL (ref 3.5–5.0)
Alkaline Phosphatase: 98 U/L (ref 38–126)
Anion gap: 8 (ref 5–15)
BUN: 14 mg/dL (ref 6–20)
CO2: 20 mmol/L — ABNORMAL LOW (ref 22–32)
Calcium: 9.4 mg/dL (ref 8.9–10.3)
Chloride: 108 mmol/L (ref 101–111)
Creatinine, Ser: 1.31 mg/dL — ABNORMAL HIGH (ref 0.44–1.00)
GFR calc Af Amer: 48 mL/min — ABNORMAL LOW (ref >60)
GFR calc non Af Amer: 41 mL/min — ABNORMAL LOW (ref >60)
Glucose, Bld: 99 mg/dL (ref 65–99)
Potassium: 3 mmol/L — ABNORMAL LOW (ref 3.5–5.1)
Sodium: 136 mmol/L (ref 135–145)
Total Bilirubin: 0.5 mg/dL (ref 0.3–1.2)
Total Protein: 7 g/dL (ref 6.5–8.1)

## 2017-04-26 LAB — IRON AND TIBC
Iron: 13 ug/dL — ABNORMAL LOW (ref 28–170)
SATURATION RATIOS: 2 % — AB (ref 10.4–31.8)
TIBC: 552 ug/dL — ABNORMAL HIGH (ref 250–450)
UIBC: 539 ug/dL

## 2017-04-26 LAB — CBC WITH DIFFERENTIAL/PLATELET
Basophils Absolute: 0.1 10*3/uL (ref 0.0–0.1)
Basophils Relative: 1 %
Eosinophils Absolute: 0.1 10*3/uL (ref 0.0–0.7)
Eosinophils Relative: 1 %
HCT: 20.8 % — ABNORMAL LOW (ref 36.0–46.0)
Hemoglobin: 5.3 g/dL — CL (ref 12.0–15.0)
Lymphocytes Relative: 26 %
Lymphs Abs: 3.1 10*3/uL (ref 0.7–4.0)
MCH: 14.3 pg — ABNORMAL LOW (ref 26.0–34.0)
MCHC: 25.5 g/dL — ABNORMAL LOW (ref 30.0–36.0)
MCV: 56.1 fL — ABNORMAL LOW (ref 78.0–100.0)
Monocytes Absolute: 0.8 10*3/uL (ref 0.1–1.0)
Monocytes Relative: 7 %
Neutro Abs: 7.9 10*3/uL — ABNORMAL HIGH (ref 1.7–7.7)
Neutrophils Relative %: 65 %
Platelets: 688 10*3/uL — ABNORMAL HIGH (ref 150–400)
RBC: 3.71 MIL/uL — ABNORMAL LOW (ref 3.87–5.11)
RDW: 20.5 % — ABNORMAL HIGH (ref 11.5–15.5)
WBC: 12 10*3/uL — ABNORMAL HIGH (ref 4.0–10.5)

## 2017-04-26 LAB — BASIC METABOLIC PANEL
ANION GAP: 9 (ref 5–15)
ANION GAP: 9 (ref 5–15)
BUN: 11 mg/dL (ref 6–20)
BUN: 9 mg/dL (ref 6–20)
CALCIUM: 8.7 mg/dL — AB (ref 8.9–10.3)
CO2: 19 mmol/L — ABNORMAL LOW (ref 22–32)
CO2: 15 mmol/L — ABNORMAL LOW (ref 22–32)
Calcium: 8.9 mg/dL (ref 8.9–10.3)
Chloride: 107 mmol/L (ref 101–111)
Chloride: 113 mmol/L — ABNORMAL HIGH (ref 101–111)
Creatinine, Ser: 1.22 mg/dL — ABNORMAL HIGH (ref 0.44–1.00)
Creatinine, Ser: 1.08 mg/dL — ABNORMAL HIGH (ref 0.44–1.00)
GFR calc Af Amer: >60 mL/min (ref >60)
GFR, EST AFRICAN AMERICAN: 52 mL/min — AB (ref >60)
GFR, EST NON AFRICAN AMERICAN: 45 mL/min — AB (ref >60)
GFR, EST NON AFRICAN AMERICAN: 52 mL/min — AB (ref >60)
GLUCOSE: 97 mg/dL (ref 65–99)
Glucose, Bld: 95 mg/dL (ref 65–99)
POTASSIUM: 2.9 mmol/L — AB (ref 3.5–5.1)
Potassium: 3.8 mmol/L (ref 3.5–5.1)
SODIUM: 135 mmol/L (ref 135–145)
SODIUM: 137 mmol/L (ref 135–145)

## 2017-04-26 LAB — TROPONIN I

## 2017-04-26 LAB — APTT: APTT: 31 s (ref 24–36)

## 2017-04-26 LAB — PROTIME-INR
INR: 1.15
PROTHROMBIN TIME: 14.6 s (ref 11.4–15.2)

## 2017-04-26 LAB — GLUCOSE, CAPILLARY: Glucose-Capillary: 82 mg/dL (ref 65–99)

## 2017-04-26 LAB — POC OCCULT BLOOD, ED: FECAL OCCULT BLD: POSITIVE — AB

## 2017-04-26 LAB — LACTATE DEHYDROGENASE: LDH: 165 U/L (ref 98–192)

## 2017-04-26 LAB — FERRITIN: Ferritin: 2 ng/mL — ABNORMAL LOW (ref 11–307)

## 2017-04-26 LAB — MAGNESIUM: MAGNESIUM: 2.1 mg/dL (ref 1.7–2.4)

## 2017-04-26 LAB — ABO/RH: ABO/RH(D): O POS

## 2017-04-26 LAB — PREPARE RBC (CROSSMATCH)

## 2017-04-26 LAB — DIGOXIN LEVEL: DIGOXIN LVL: 0.6 ng/mL — AB (ref 0.8–2.0)

## 2017-04-26 MED ORDER — ATORVASTATIN CALCIUM 20 MG PO TABS
20.0000 mg | ORAL_TABLET | Freq: Every day | ORAL | Status: DC
  Administered 2017-04-26 – 2017-04-27 (×2): 20 mg via ORAL
  Filled 2017-04-26 (×2): qty 1

## 2017-04-26 MED ORDER — SODIUM CHLORIDE 0.9 % IV SOLN
250.0000 mg | Freq: Every day | INTRAVENOUS | Status: DC
  Administered 2017-04-26 – 2017-04-28 (×3): 250 mg via INTRAVENOUS
  Filled 2017-04-26 (×5): qty 20

## 2017-04-26 MED ORDER — SODIUM CHLORIDE 0.9 % IV SOLN
80.0000 mg | Freq: Once | INTRAVENOUS | Status: DC
  Filled 2017-04-26: qty 80

## 2017-04-26 MED ORDER — PANTOPRAZOLE SODIUM 40 MG PO TBEC
40.0000 mg | DELAYED_RELEASE_TABLET | Freq: Two times a day (BID) | ORAL | Status: DC
  Administered 2017-04-26 – 2017-04-28 (×4): 40 mg via ORAL
  Filled 2017-04-26 (×4): qty 1

## 2017-04-26 MED ORDER — HYDRALAZINE HCL 20 MG/ML IJ SOLN: 5.0000 mg | INTRAMUSCULAR | Status: DC | PRN

## 2017-04-26 MED ORDER — SODIUM CHLORIDE 0.9 % IV SOLN
INTRAVENOUS | Status: DC
  Administered 2017-04-26 (×2): via INTRAVENOUS

## 2017-04-26 MED ORDER — PEG-KCL-NACL-NASULF-NA ASC-C 100 G PO SOLR
0.5000 | Freq: Once | ORAL | Status: AC
  Administered 2017-04-27: 100 g via ORAL

## 2017-04-26 MED ORDER — METOPROLOL SUCCINATE ER 25 MG PO TB24
25.0000 mg | ORAL_TABLET | Freq: Every day | ORAL | Status: DC
  Administered 2017-04-26 – 2017-04-28 (×3): 25 mg via ORAL
  Filled 2017-04-26 (×3): qty 1

## 2017-04-26 MED ORDER — ALBUTEROL SULFATE (2.5 MG/3ML) 0.083% IN NEBU: 2.5000 mg | INHALATION_SOLUTION | RESPIRATORY_TRACT | Status: DC | PRN

## 2017-04-26 MED ORDER — LEVOTHYROXINE SODIUM 75 MCG PO TABS
75.0000 ug | ORAL_TABLET | Freq: Every day | ORAL | Status: DC
  Administered 2017-04-26: 75 ug via ORAL
  Filled 2017-04-26: qty 1

## 2017-04-26 MED ORDER — ONDANSETRON HCL 4 MG/2ML IJ SOLN
4.0000 mg | Freq: Four times a day (QID) | INTRAMUSCULAR | Status: DC | PRN
  Administered 2017-04-26: 4 mg via INTRAVENOUS
  Filled 2017-04-26: qty 2

## 2017-04-26 MED ORDER — SODIUM CHLORIDE 0.9 % IV SOLN
Freq: Once | INTRAVENOUS | Status: AC
  Administered 2017-04-27: 12:00:00 via INTRAVENOUS

## 2017-04-26 MED ORDER — AMITRIPTYLINE HCL 50 MG PO TABS
50.0000 mg | ORAL_TABLET | Freq: Every day | ORAL | Status: DC
  Administered 2017-04-26 – 2017-04-27 (×2): 50 mg via ORAL
  Filled 2017-04-26: qty 1
  Filled 2017-04-26: qty 2

## 2017-04-26 MED ORDER — KETOTIFEN FUMARATE 0.025 % OP SOLN
1.0000 [drp] | Freq: Two times a day (BID) | OPHTHALMIC | Status: DC
  Administered 2017-04-26 – 2017-04-28 (×5): 1 [drp] via OPHTHALMIC
  Filled 2017-04-26: qty 5

## 2017-04-26 MED ORDER — BISACODYL 5 MG PO TBEC
5.0000 mg | DELAYED_RELEASE_TABLET | Freq: Four times a day (QID) | ORAL | Status: AC
  Administered 2017-04-26 (×2): 5 mg via ORAL
  Filled 2017-04-26 (×2): qty 1

## 2017-04-26 MED ORDER — ACETAMINOPHEN 325 MG PO TABS
650.0000 mg | ORAL_TABLET | Freq: Four times a day (QID) | ORAL | Status: DC | PRN
  Administered 2017-04-26: 650 mg via ORAL
  Filled 2017-04-26: qty 2

## 2017-04-26 MED ORDER — ACETAMINOPHEN 650 MG RE SUPP: 650.0000 mg | Freq: Four times a day (QID) | RECTAL | Status: DC | PRN

## 2017-04-26 MED ORDER — PANTOPRAZOLE SODIUM 40 MG IV SOLR: 40.0000 mg | Freq: Two times a day (BID) | INTRAVENOUS | Status: DC

## 2017-04-26 MED ORDER — METOCLOPRAMIDE HCL 5 MG/ML IJ SOLN
10.0000 mg | Freq: Once | INTRAMUSCULAR | Status: AC
  Administered 2017-04-27: 10 mg via INTRAVENOUS
  Filled 2017-04-26: qty 2

## 2017-04-26 MED ORDER — ONDANSETRON HCL 4 MG PO TABS: 4.0000 mg | ORAL_TABLET | Freq: Four times a day (QID) | ORAL | Status: DC | PRN

## 2017-04-26 MED ORDER — PEG-KCL-NACL-NASULF-NA ASC-C 100 G PO SOLR: 1.0000 | Freq: Once | ORAL | Status: DC

## 2017-04-26 MED ORDER — DIGOXIN 125 MCG PO TABS
0.1250 mg | ORAL_TABLET | Freq: Every day | ORAL | Status: DC
Start: 2017-04-26 — End: 2017-04-28
  Administered 2017-04-26 – 2017-04-28 (×3): 0.125 mg via ORAL
  Filled 2017-04-26 (×3): qty 1

## 2017-04-26 MED ORDER — METOCLOPRAMIDE HCL 5 MG/ML IJ SOLN
10.0000 mg | Freq: Once | INTRAMUSCULAR | Status: AC
  Administered 2017-04-26: 10 mg via INTRAVENOUS
  Filled 2017-04-26: qty 2

## 2017-04-26 MED ORDER — SODIUM CHLORIDE 0.9 % IV SOLN
8.0000 mg/h | INTRAVENOUS | Status: DC
  Administered 2017-04-26: 8 mg/h via INTRAVENOUS
  Filled 2017-04-26 (×3): qty 80

## 2017-04-26 MED ORDER — SODIUM CHLORIDE 0.9 % IV SOLN
INTRAVENOUS | Status: DC
  Administered 2017-04-26: 08:00:00 via INTRAVENOUS

## 2017-04-26 MED ORDER — ZOLPIDEM TARTRATE 5 MG PO TABS: 5.0000 mg | ORAL_TABLET | Freq: Every evening | ORAL | Status: DC | PRN

## 2017-04-26 MED ORDER — PEG-KCL-NACL-NASULF-NA ASC-C 100 G PO SOLR
0.5000 | Freq: Once | ORAL | Status: AC
Start: 2017-04-26 — End: 2017-04-26
  Administered 2017-04-26: 100 g via ORAL
  Filled 2017-04-26: qty 1

## 2017-04-26 MED ORDER — POTASSIUM CHLORIDE CRYS ER 20 MEQ PO TBCR
40.0000 meq | EXTENDED_RELEASE_TABLET | Freq: Once | ORAL | Status: AC
  Administered 2017-04-26: 40 meq via ORAL
  Filled 2017-04-26: qty 2

## 2017-04-26 MED ORDER — ALPRAZOLAM 0.5 MG PO TABS
0.5000 mg | ORAL_TABLET | Freq: Two times a day (BID) | ORAL | Status: DC | PRN
  Administered 2017-04-26: 0.5 mg via ORAL
  Filled 2017-04-26: qty 2

## 2017-04-26 MED ORDER — POTASSIUM CHLORIDE 10 MEQ/100ML IV SOLN
10.0000 meq | INTRAVENOUS | Status: AC
  Administered 2017-04-26 (×6): 10 meq via INTRAVENOUS
  Filled 2017-04-26 (×5): qty 100

## 2017-04-26 NOTE — Consult Note (Addendum)
[facility] Consult: 9:08 AM 04/26/2017  LOS: 0 days    Referring Provider: Dr Algis Liming  Primary Care Physician:  Midge Minium, MD Primary Gastroenterologist:  Dr. Earlean Shawl >>  Fuller Plan    Reason for Consultation:  Anemia, FOBT +    HPI: Kaitlyn Adams is a 66 y.o. female.  PMH PAF, on Xarelto.  04/2015 echo: EF 65 - 69%, grade 1 diastolic dysfx.  Hyperthyroidism >> 2010 thyroidectomy >> hypothyroidism post 2010.  DJD/DDD spine. Anxiety.  Constipation.  GERD.    12/2001 Colonoscopy for IDA, Dr Earlean Shawl.  9 mm sessile, ascending polyp removed (tubular adenoma).  Melanosis Coli (pathology confirmed melanosis).   11/2001 EGD Dr Earlean Shawl for N/V, anorexia. Hemorrhagic gastritis in setting of ASA, [place].      06/2016 GI OV with PA Zehr for microcytic anemia, positive Cologuard.  Hgb 7.8. FeSo4 325 started by PCP.  Plan was to repeat EGD/Colonoscopy off Xarelto.  Linzess added to replace OTC laxatives.   In 08/2016 Dr Martinique ok'd 36 hour Xarelto hold prior to studies.  Pt cancelled EGD/Colon set for 1/172018 and has yet to reschedule.    OV with Dr Martinique 7/19, pt c/o fatigue, increased DOE.  MD attributed to asthma, frailty and RX'd Medrol  Dose pack.   PMD PA-C OV 7/31, RXd Amoxil for acute bronchitis, Pulmicort for asthma.   Early interstitial PNA on CXR 8/2.  RXd Augmentin (pt refused Doxy, Macrolide, fluroquinolone.  ED if worsening. Pt declined addition of steroid 5 days later.     OV with Dr Birdie Riddle 8/8.  Still c/o dyspnea, weakness.  Cough improved.  Augmentin extended for 7 additional days and Prednisone taper initiated. 9/4 OV with PA.  Cough improved but persistent, worsening shortness of breath, exertional chest tightness and generalized weakness for more than 2 months.  Despite PPI, having GERD sxs.  Hgb  measured 5 MCV 53, was 12.4, MCV 72 in 06/2016.  Pt sent to ED yesterday afternoon.    S/p PRBC x 2.  Hgb 5 >> 5.3 >> 6.8.  Slight increase creatinine, normal BUN.  LFTS Iron 13, Ferritin 2.  FOBT +.  Coags, platelets normal.  CXR normal.    Weight 114#, 125# in 08/2016.  She's had anorexia and early satiety for several months.Despite 40 mg of omeprazole daily, she has a lot of reflux symptoms and is using Tums and Gas-X frequently for breakthrough symptoms.   No dysphagia.  No nausea or vomiting. For about 5 days she's been seeing scant blood with wiping after bowel movements and her hemorrhoids feel "like golf balls". These are uncomfortable. No prior history of hemorrhoidal bleeding. Caliber of stool is small but not pencil thin. She has never had any melenic, black or grossly bloody stools. Linzess did not help her constipation so she is still using OTC Bisacodyl several tmes per week.  She's never required previous transfusions. She stopped taking oral iron several months ago because it caused GI upset. She's never had parenteral iron infusions.  No excessive EtOH. No family history of colorectal  neoplasia, ulcer disease, GI bleed, anemia. She doesn't use nonsteroidal anti-inflammatory medications or significant aspirin. In July she saw her endocrinologist, Dr. York Spaniel. At that time TSH was checked and was normal, no dosage adjustments made to her Synthroid. Patient hasn't had any excessive/unusual bruising or bleeding from the skin. No nosebleeds, no oral bleeding.  Her last dose of Xarelto was taken in the evening of 9/3.    Past Medical History:  Diagnosis Date  . Anemia   . Asthma    - normal spirometry 2009 FEV1  >90% predicted.  - methacoline challenge neg 11/09  . Bronchitis    recurrent  . Cataract   . Chronic cough    - sinus CT 03-23-10 > neg.  - allergy profile 03-23-10 > nl, IgE 14.  - flutter valve rx 03-23-10  . Fibromyalgia   . GERD (gastroesophageal reflux disease)    exacerbating  VCD  . Glaucoma   . Hyperlipidemia   . Hypertension   . Hyperthyroidism    with hot nodule.  - total thyroidectomy 11-05-08 benign -> subsequent hypothyroidism.  Marland Kitchen Hypokalemia   . Hypothyroidism   . Hypothyroidism    a. following thyroidectomy.  . Mild CAD    a. LHC 04/2015 - mild nonobstructive CAD with 30% dLAD, 40% D1, 25% pLCx, 30% mRCA, 30% RPDA, normal EF >65% with normal LVEDP.  . Orthostatic hypotension   . Osteoarthritis   . Paroxysmal atrial fibrillation (HCC)   . Renal insufficiency   . Vasomotor rhinitis    exacerbates VCD  . Vocal cord dysfunction    proven on FOB 9/09    Past Surgical History:  Procedure Laterality Date  . ABDOMINAL HYSTERECTOMY  1980  . ABDOMINAL HYSTERECTOMY    . BASAL CELL CARCINOMA EXCISION  11/08  . CARDIAC CATHETERIZATION N/A 05/15/2015   Procedure: Left Heart Cath and Coronary Angiography;  Surgeon: Sherren Mocha, MD;  Location: [facility];  Service: Cardiovascular;  Laterality: N/A;  . EYE SURGERY    . scar tissue removal  1982  . THYROID SURGERY  2010  . THYROIDECTOMY  11-05-08    Prior to Admission medications   Medication Sig Start Date End Date Taking? Authorizing Provider  Acetaminophen (TYLENOL ARTHRITIS PAIN PO) Take 325 mg by mouth daily as needed (FOR MILD PAIN).    Yes [provider]  albuterol (PROVENTIL HFA;VENTOLIN HFA) 108 (90 Base) MCG/ACT inhaler Inhale 2 puffs into the lungs every 6 (six) hours as needed for wheezing or shortness of breath. 03/29/17  Yes Midge Minium, MD  ALPRAZolam Duanne Moron) 0.5 MG tablet Take 1 tablet (0.5 mg total) by mouth 2 (two) times daily as needed for anxiety. 05/18/16  Yes Midge Minium, MD  amitriptyline (ELAVIL) 50 MG tablet TAKE 1 TABLET(50 MG) BY MOUTH AT BEDTIME 03/16/17  Yes Midge Minium, MD  atorvastatin (LIPITOR) 20 MG tablet TAKE 1 TABLET(20 MG) BY MOUTH DAILY 03/16/17  Yes Midge Minium, MD  DIGOX 125 MCG tablet TAKE 1 TABLET(0.125 MG) BY MOUTH DAILY  03/16/17  Yes Martinique, Peter M, MD  ketotifen (ZADITOR) 0.025 % ophthalmic solution Place 1 drop into both eyes 2 (two) times daily.   Yes [provider]  levothyroxine (SYNTHROID, LEVOTHROID) 75 MCG tablet Take 1 tablet (75 mcg total) by mouth daily. 11/19/15  Yes Midge Minium, MD  metoprolol succinate (TOPROL-XL) 25 MG 24 hr tablet TAKE 1 TABLET(25 MG) BY MOUTH DAILY 03/20/17  Yes Midge Minium, MD  omeprazole (  PRILOSEC) 40 MG capsule TAKE 1 CAPSULE(40 MG) BY MOUTH DAILY 04/13/17  Yes Midge Minium, MD  Rivaroxaban (XARELTO) 15 MG TABS tablet Take 1 tablet (15 mg total) by mouth daily with supper. 12/13/16  Yes Lelon Perla, MD  spironolactone (ALDACTONE) 25 MG tablet TAKE 1/2 TABLET(12.5 MG) BY MOUTH DAILY 03/16/17  Yes Midge Minium, MD  Vitamin D, Ergocalciferol, (DRISDOL) 50000 units CAPS capsule Take 50,000 Units by mouth every 7 (seven) days.   Yes [provider]    Scheduled Meds: . amitriptyline  50 mg Oral QHS  . atorvastatin  20 mg Oral q1800  . digoxin  0.125 mg Oral Daily  . ketotifen  1 drop Both Eyes BID  . levothyroxine  75 mcg Oral QAC breakfast  . metoprolol succinate  25 mg Oral Daily  . [START ON 04/29/2017] pantoprazole  40 mg Intravenous Q12H   Infusions: . sodium chloride    . sodium chloride 100 mL/hr at 04/26/17 0749  . pantoprazole (PROTONIX) IVPB    . pantoprozole (PROTONIX) infusion 8 mg/hr (04/26/17 0858)  . potassium chloride Stopped (04/26/17 0859)   PRN Meds: acetaminophen **OR** acetaminophen, albuterol, ALPRAZolam, hydrALAZINE, ondansetron **OR** ondansetron (ZOFRAN) IV, zolpidem   Allergies as of 04/25/2017 - Review Complete 04/25/2017  Allergen Reaction Noted  . Codeine Itching   . Erythromycin Nausea And Vomiting   . Guaifenesin er Palpitations 05/22/2015  . Hydrocod polst-cpm polst er Other (See Comments) 05/22/2015  . Sulfonamide derivatives Nausea And Vomiting   . Tramadol hcl Itching 03/31/2010     Family History  Problem Relation Age of Onset  . Asthma Mother   . Heart disease Mother   . Emphysema Father   . Heart disease Father   . Asthma Grandchild     Social History   Social History  . Marital status: Married    Spouse name: N/A  . Number of children: 1  . Years of education: N/A   Occupational History  . 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 Smoking status: Never Smoker  . Smokeless tobacco: Never Used  . Alcohol use 0.6 oz/week    1 Glasses of wine per week     Comment: occasional wine or beer  . Drug use: No  . Sexual activity: Yes    Birth control/ protection: None   Other Topics Concern  . Not on file   Social History Narrative  . No narrative on file    REVIEW OF SYSTEMS: Constitutional:  Per HPI.  Her weakness/fatigue was so profound that yesterday she could hardly walk around the office. ENT:  No nose bleeds.  No oral bleeding. Pulm:  Some cough but is not producing sputum.  DOE per HPI CV:  No palpitations, no LE edema.  GU:  No hematuria, no frequency Gyn;  Mammogram 08/2015 GI:  Per HPI Heme:  Per HPI   Transfusions:  No previous transfusions until yesterday. Neuro:  No headaches, no peripheral tingling or numbness Derm:  No itching, no rash or sores.  Endocrine:  No sweats or chills.  No polyuria or dysuria Immunization:  She has had pneumococcal vaccination in 2005 and 2017. T dap 2013. Travel:  None beyond local counties in last few months.    PHYSICAL EXAM: Vital signs in last 24 hours: Vitals:   04/26/17 0701 04/26/17 0847  BP: 135/61 (!) 138/55  Pulse: 75 80  Resp: 18 18  Temp: 98.6 F (37 C) 98.6 F (37 C)  SpO2: 100% 100%   Wt Readings from Last 3 Encounters:  04/26/17 51.7 kg (114 lb)  04/25/17 49.9 kg (110 lb)  03/29/17 51.3 kg (113 lb 2 oz)    General: Pleasant, somewhat pale, thin but not cachectic appearing WF. She is comfortable and alert. Head:  No  facial asymmetry or swelling. No signs of head trauma.  Eyes:  No scleral icterus. Conjunctiva is pale. EOMI. Ears:  Not hard of hearing  Nose:  No discharge or congestion. Mouth:  Good dentition. Tongue midline. Oral mucosa moist and clear, slightly pale. Neck:  No JVD, no thyromegaly, no masses, no bruits. Lungs:  Vocal quality raspy. Lungs clear to auscultation bilaterally. Slight, nonproductive cough. Heart: RRR. S1, S2 audible. 2/6 systolic murmur Abdomen:  Soft. No masses, bruits, hernias, organomegaly. Nontender. Active bowel sounds..   Rectal: Visible external hemorrhoids, non-thrombosed, with small area of erythema but no visible fissure.  Tight sphincter, DRE exam uncomfortable and done yesterday, so did not repeat given plan for colonoscopy tomorrow.     Musc/Skeltl: No obvious joint redness, swelling or gross deformities. Extremities:  No CCE. Refill in the toes brisk.  Neurologic:  Alert. Oriented times 3. Good historian. Moves all 4 limbs with full strength. No tremors. Skin:  No rashes, sores, or suspicious lesions. Tattoos:  None visualized. Nodes:  No cervical adenopathy   Psych:  Pleasant, slightly anxious. Cooperative.  Intake/Output from previous day: 09/04 0701 - 09/05 0700 In: 345 [Blood:345] Out: -  Intake/Output this shift: Total I/O In: 315 [Blood:315] Out: 0   LAB RESULTS:  Recent Labs  04/25/17 1517 04/25/17 2347 04/26/17 0455  WBC 11.5* 12.0* 8.1  HGB 5.0 Repeated and verified X2.* 5.3* 6.8*  HCT 18.5 Repeated and verified X2.* 20.8* 25.2*  PLT 688.0* 688* 575*   BMET Lab Results  Component Value Date   NA 135 04/26/2017   NA 136 04/25/2017   NA 139 06/15/2016   K 2.9 (L) 04/26/2017   K 3.0 (L) 04/25/2017   K 3.9 06/15/2016   CL 107 04/26/2017   CL 108 04/25/2017   CL 104 06/15/2016   CO2 19 (L) 04/26/2017   CO2 20 (L) 04/25/2017   CO2 27 06/15/2016   GLUCOSE 95 04/26/2017   GLUCOSE 99 04/25/2017   GLUCOSE 89 06/15/2016   BUN 11  04/26/2017   BUN 14 04/25/2017   BUN 11 06/15/2016   CREATININE 1.22 (H) 04/26/2017   CREATININE 1.31 (H) 04/25/2017   CREATININE 1.09 06/15/2016   CALCIUM 8.9 04/26/2017   CALCIUM 9.4 04/25/2017   CALCIUM 10.4 06/15/2016   LFT  Recent Labs  04/25/17 2347  PROT 7.0  ALBUMIN 3.9  AST 19  ALT 15  ALKPHOS 98  BILITOT 0.5   PT/INR Lab Results  Component Value Date   INR 1.15 04/26/2017   INR 1.20 05/15/2015   Hepatitis Panel No results for input(s): HEPBSAG, HCVAB, HEPAIGM, HEPBIGM in the last 72 hours. C-Diff No components found for: CDIFF Lipase  No results found for: LIPASE  Drugs of Abuse  No results found for: LABOPIA, COCAINSCRNUR, LABBENZ, AMPHETMU, THCU, LABBARB   RADIOLOGY STUDIES: Dg Chest 2 View  Result Date: 04/25/2017 CLINICAL DATA:  Chest tightness. EXAM: CHEST  2 VIEW COMPARISON:  Radiographs of March 21, 2017. FINDINGS: The heart size and mediastinal contours are within normal limits. Both lungs are clear. No pneumothorax or pleural effusion is noted. No pneumothorax or pleural  effusion is noted. The visualized skeletal structures are unremarkable. IMPRESSION: No active cardiopulmonary disease. Electronically Signed   By: Marijo Conception, M.D.   On: 04/25/2017 16:07     IMPRESSION:   *  Progressive, iron deficiency anemia, present for at least one year. Wasn't able to tolerate oral iron. FOBT positive stools.  Minor recent, scant bleeding PR is not responsible for her anemia. History adenomatous colon polyp in 2003 with no repeat surveillance since then. Rule out recurrent polyp or neoplasia/cancer. Rule out AVMs. GERD is not well controlled with once daily PPI and she's had anorexia and decreased by mouth intake with weight loss for several months.. Rule out ulcer disease, upper intestinal neoplasia.  S/p PRBC x 1.    *  Chronic constipation.    *  PAF.  On chronic Xarelto for many years.  *  Post thyroidectomy hypothyroidism, TSH ok in 02/2017.      PLAN:     *  Pt set up for 12:30 colon/EGD on 9/6. Patient asked if the doctor would be able to treat her hemorrhoids during the colonoscopy. Patient was advised that the doctor may or may not be able to do this, and that it needed to be determined that the hemorrhoids are significant enough to require intervention. She may have other sources for the minor rectal bleeding seen within the past week.  *  Stopped Protonix gtt and replaced with BID po Protonix.  Clear liquids.  Split dose Moviprep   *  ? Additional PRBC today?  CBC repeat drawn in the room at about 10:50 AM   Azucena Freed  04/26/2017, 9:08 AM Pager: [phone]   Attending physician's note   I have taken a history, examined the patient and reviewed the chart. I agree with the Advanced Practitioner's note, impression and recommendations. 62 yr F with iron deficiency anemia and heme positive stool. Adenomatous polyp removed in 2003, she is past due for surveillance colonoscopy. Patient has uncontrolled GERD and upper GI symptoms.  Will proceed with EGD and colonoscopy for evalaution Monitor Hgb and transfuse as needed to maintain Hgb>7 Hold Xarelto The risks and benefits as well as alternatives of endoscopic procedure(s) have been discussed and reviewed. All questions answered. The patient agrees to proceed.  Damaris Hippo, MD [phone] Mon-Fri 8a-5p [phone] after 5p, weekends, holidays

## 2017-04-26 NOTE — Care Management Note (Signed)
Case Management Note  Patient Details  Name: Kaitlyn Adams MRN: [id number] Date of Birth: [birth year]  Subjective/Objective:                 Independent patient from home Admitted for evaluation and management of symptomatic severe anemia. [facility] consulted and plan EGD and colonoscopy on 9/6. Improved after 2 units PRBCs.   Action/Plan:  CM will continue to follow for DC planning.  Expected Discharge Date:  04/28/17               Expected Discharge Plan:  Home/Self Care  In-House Referral:     Discharge planning Services  CM Consult  Post Acute Care Choice:    Choice offered to:     DME Arranged:    DME Agency:     HH Arranged:    HH Agency:     Status of Service:  In process, will continue to follow  If discussed at Long Length of Stay Meetings, dates discussed:    Additional Comments:  Carles Collet, RN 04/26/2017, 1:29 PM
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
## 2017-04-26 NOTE — Progress Notes (Signed)
Arrival Method: Patient arrived in stretcher from ED. Mental Orientation: alert Telemetry:yes Assessment: See Doc Flow sheets. Skin: Warm, dry and intact. IV: Peripheral Ileft arm Pain:.denies Fall Prevention Safety Plan: Patient educated about fall prevention safety plan, understood and acknowledged. Admission Screening:6700 Orientation: Patient has been oriented to the unit, staff and to the room.

## 2017-04-26 NOTE — ED Provider Notes (Signed)
[facility] DEPT Provider Note   CSN: [id number] Arrival date & time: 04/25/17  1723     History   Chief Complaint Chief Complaint  Patient presents with  . Shortness of Breath  . Anemia  . Weakness    HPI Kaitlyn Adams is a 66 y.o. female.  HPI   End of march, April was working more hours, thought was more run down, had bronchitis intermittently, then noticed dyspnea getting worse in June, July. July 31st was diagnosed with pneumonia, given  For a few weeks it has been worsening, feels dyspnea with minimal exertional, even just pulling blankets over self, severe dyspnea on exertion, needs to rest even walking a little bit.  Reports some associated chest tightness and reflux after or while eating.  On xarelto, not sure when last blood work was prior to today  Knows run down, under stress at work.    Hx of hemorrhoids, last wed or thurs had small amt of bright red blood on tissue paper, similar to prior hemorrhoids, no black or bloody stools or severe bleeding  Hx of acid reflux, which is worsening, feels like things getting stuck at times No vaginal bleeding or urinary bleeding  Cologard came back suspicious, they recommended colonoscopy, was scheduled in January but wasn't able to go.   Past Medical History:  Diagnosis Date  . Anemia   . Asthma    - normal spirometry 2009 FEV1  >90% predicted.  - methacoline challenge neg 11/09  . Bronchitis    recurrent  . Cataract   . Chronic cough    - sinus CT 03-23-10 > neg.  - allergy profile 03-23-10 > nl, IgE 14.  - flutter valve rx 03-23-10  . Fibromyalgia   . GERD (gastroesophageal reflux disease)    exacerbating VCD  . Glaucoma   . Hyperlipidemia   . Hypertension   . Hyperthyroidism    with hot nodule.  - total thyroidectomy 11-05-08 benign -> subsequent hypothyroidism.  Marland Kitchen Hypokalemia   . Hypothyroidism   . Hypothyroidism    a. following thyroidectomy.  . Mild CAD    a. LHC 04/2015 - mild nonobstructive CAD with  30% dLAD, 40% D1, 25% pLCx, 30% mRCA, 30% RPDA, normal EF >65% with normal LVEDP.  . Orthostatic hypotension   . Osteoarthritis   . Paroxysmal atrial fibrillation (HCC)   . Renal insufficiency   . Vasomotor rhinitis    exacerbates VCD  . Vocal cord dysfunction    proven on FOB 9/09    Patient Active Problem List   Diagnosis Date Noted  . Symptomatic anemia 04/26/2017  . AKI (acute kidney injury) ([place]) 04/26/2017  . Left shoulder pain 07/20/2016  . Microcytic anemia 07/07/2016  . Chronic constipation 07/07/2016  . History of colonic polyps 07/07/2016  . Nonspecific abnormal finding in stool contents 07/07/2016  . Chronic anticoagulation 07/07/2016  . Iron deficiency anemia 06/15/2016  . Anxiety and depression 05/18/2016  . Diarrhea 05/18/2016  . Acute frontal sinusitis 01/15/2016  . Physical exam 11/18/2015  . Pain in joint, ankle and foot 11/16/2015  . PAT (paroxysmal atrial tachycardia) ([place]) 09/08/2015  . Hyperlipidemia 07/30/2015  . Maxillary sinusitis, acute 07/30/2015  . Hypokalemia   . CAD (coronary artery disease)   . Chest pain at rest, not due to CAD, possible due to a fib. 05/15/2015  . Chronic bronchitis ([facility]) 06/17/2011  . Orthostatic hypotension 12/06/2010  . Paroxysmal atrial fibrillation (HCC)   . Renal insufficiency   . Myalgia   .  Hypothyroidism   . Glaucoma   . GERD, SEVERE 08/19/2010  . ALLERGIC RHINITIS 04/21/2010  . Cough 03/31/2010  . VOCAL CORD DISORDER 05/13/2008  . Essential hypertension 04/17/2008  . OSTEOARTHRITIS 04/17/2008  . FIBROMYALGIA 04/17/2008    Past Surgical History:  Procedure Laterality Date  . ABDOMINAL HYSTERECTOMY  1980  . ABDOMINAL HYSTERECTOMY    . BASAL CELL CARCINOMA EXCISION  11/08  . CARDIAC CATHETERIZATION N/A 05/15/2015   Procedure: Left Heart Cath and Coronary Angiography;  Surgeon: Sherren Mocha, MD;  Location: [facility];  Service: Cardiovascular;  Laterality: N/A;  . EYE SURGERY    . scar tissue  removal  1982  . THYROID SURGERY  2010  . THYROIDECTOMY  11-05-08    OB History    No data available       Home Medications    Prior to Admission medications   Medication Sig Start Date End Date Taking? Authorizing Provider  Acetaminophen (TYLENOL ARTHRITIS PAIN PO) Take 325 mg by mouth daily as needed (FOR MILD PAIN).    Yes [provider]  albuterol (PROVENTIL HFA;VENTOLIN HFA) 108 (90 Base) MCG/ACT inhaler Inhale 2 puffs into the lungs every 6 (six) hours as needed for wheezing or shortness of breath. 03/29/17  Yes Midge Minium, MD  ALPRAZolam Duanne Moron) 0.5 MG tablet Take 1 tablet (0.5 mg total) by mouth 2 (two) times daily as needed for anxiety. 05/18/16  Yes Midge Minium, MD  amitriptyline (ELAVIL) 50 MG tablet TAKE 1 TABLET(50 MG) BY MOUTH AT BEDTIME 03/16/17  Yes Midge Minium, MD  atorvastatin (LIPITOR) 20 MG tablet TAKE 1 TABLET(20 MG) BY MOUTH DAILY 03/16/17  Yes Midge Minium, MD  DIGOX 125 MCG tablet TAKE 1 TABLET(0.125 MG) BY MOUTH DAILY 03/16/17  Yes Martinique, Peter M, MD  ketotifen (ZADITOR) 0.025 % ophthalmic solution Place 1 drop into both eyes 2 (two) times daily.   Yes [provider]  levothyroxine (SYNTHROID, LEVOTHROID) 75 MCG tablet Take 1 tablet (75 mcg total) by mouth daily. 11/19/15  Yes Midge Minium, MD  metoprolol succinate (TOPROL-XL) 25 MG 24 hr tablet TAKE 1 TABLET(25 MG) BY MOUTH DAILY 03/20/17  Yes Midge Minium, MD  omeprazole (PRILOSEC) 40 MG capsule TAKE 1 CAPSULE(40 MG) BY MOUTH DAILY 04/13/17  Yes Midge Minium, MD  Rivaroxaban (XARELTO) 15 MG TABS tablet Take 1 tablet (15 mg total) by mouth daily with supper. 12/13/16  Yes Lelon Perla, MD  spironolactone (ALDACTONE) 25 MG tablet TAKE 1/2 TABLET(12.5 MG) BY MOUTH DAILY 03/16/17  Yes Midge Minium, MD  Vitamin D, Ergocalciferol, (DRISDOL) 50000 units CAPS capsule Take 50,000 Units by mouth every 7 (seven) days.   Yes [provider]     Family History Family History  Problem Relation Age of Onset  . Asthma Mother   . Heart disease Mother   . Emphysema Father   . Heart disease Father   . Asthma Grandchild     Social History Social History  Substance Use Topics  . Smoking status: Never Smoker  . Smokeless tobacco: Never Used  . Alcohol use 0.6 oz/week    1 Glasses of wine per week     Comment: occasional wine or beer     Allergies   Codeine; Erythromycin; Guaifenesin er; Hydrocod polst-cpm polst er; Sulfonamide derivatives; and Tramadol hcl   Review of Systems Review of Systems  Constitutional: Positive for fatigue. Negative for fever.  HENT: Negative for sore throat.  Eyes: Negative for visual disturbance.  Respiratory: Positive for chest tightness and shortness of breath. Negative for cough.   Cardiovascular: Negative for chest pain.  Gastrointestinal: Positive for abdominal pain and anal bleeding. Negative for nausea and vomiting.  Genitourinary: Negative for difficulty urinating.  Musculoskeletal: Negative for back pain and neck pain.  Skin: Negative for rash.  Neurological: Positive for light-headedness. Negative for syncope and headaches.     Physical Exam Updated Vital Signs BP (!) 138/55 (BP Location: Right Arm)   Pulse 80   Temp 98.6 F (37 C) (Oral)   Resp 18   Ht '5\' 4"'  (1.626 m)   Wt 51.7 kg (114 lb)   SpO2 100%   BMI 19.57 kg/m   Physical Exam  Constitutional: She is oriented to person, place, and time. She appears well-developed and well-nourished. No distress.  HENT:  Head: Normocephalic and atraumatic.  Eyes: Conjunctivae and EOM are normal.  Neck: Normal range of motion.  Cardiovascular: Normal rate, regular rhythm, normal heart sounds and intact distal pulses.  Exam reveals no gallop and no friction rub.   No murmur heard. Pulmonary/Chest: Effort normal and breath sounds normal. No respiratory distress. She has no wheezes. She has no rales.  Abdominal: Soft. She  exhibits no distension. There is no tenderness. There is no guarding.  Genitourinary:  Genitourinary Comments: High rectal tone Scant stool sample appears brown in color  Musculoskeletal: She exhibits no edema or tenderness.  Neurological: She is alert and oriented to person, place, and time.  Skin: Skin is warm and dry. No rash noted. She is not diaphoretic. No erythema.  Nursing note and vitals reviewed.    ED Treatments / Results  Labs (all labs ordered are listed, but only abnormal results are displayed) Labs Reviewed  CBC WITH DIFFERENTIAL/PLATELET - Abnormal; Notable for the following:       Result Value   WBC 12.0 (*)    RBC 3.71 (*)    Hemoglobin 5.3 (*)    HCT 20.8 (*)    MCV 56.1 (*)    MCH 14.3 (*)    MCHC 25.5 (*)    RDW 20.5 (*)    Platelets 688 (*)    Neutro Abs 7.9 (*)    All other components within normal limits  COMPREHENSIVE METABOLIC PANEL - Abnormal; Notable for the following:    Potassium 3.0 (*)    CO2 20 (*)    Creatinine, Ser 1.31 (*)    GFR calc non Af Amer 41 (*)    GFR calc Af Amer 48 (*)    All other components within normal limits  IRON AND TIBC - Abnormal; Notable for the following:    Iron 13 (*)    TIBC 552 (*)    Saturation Ratios 2 (*)    All other components within normal limits  FERRITIN - Abnormal; Notable for the following:    Ferritin 2 (*)    All other components within normal limits  DIGOXIN LEVEL - Abnormal; Notable for the following:    Digoxin Level 0.6 (*)    All other components within normal limits  CBC - Abnormal; Notable for the following:    Hemoglobin 6.8 (*)    HCT 25.2 (*)    MCV 60.7 (*)    MCH 16.4 (*)    MCHC 27.0 (*)    RDW 25.3 (*)    Platelets 575 (*)    All other components within normal limits  BASIC METABOLIC PANEL - Abnormal;  Notable for the following:    Potassium 2.9 (*)    CO2 19 (*)    Creatinine, Ser 1.22 (*)    GFR calc non Af Amer 45 (*)    GFR calc Af Amer 52 (*)    All other components  within normal limits  BASIC METABOLIC PANEL - Abnormal; Notable for the following:    Chloride 113 (*)    CO2 15 (*)    Creatinine, Ser 1.08 (*)    Calcium 8.7 (*)    GFR calc non Af Amer 52 (*)    All other components within normal limits  CBC - Abnormal; Notable for the following:    Hemoglobin 8.6 (*)    HCT 30.1 (*)    MCV 64.5 (*)    MCH 18.4 (*)    MCHC 28.6 (*)    RDW 27.7 (*)    Platelets 555 (*)    All other components within normal limits  CBC - Abnormal; Notable for the following:    WBC 18.9 (*)    RBC 7.16 (*)    HCT 46.2 (*)    MCV 64.5 (*)    MCH 19.0 (*)    MCHC 29.4 (*)    RDW 28.2 (*)    All other components within normal limits  POC OCCULT BLOOD, ED - Abnormal; Notable for the following:    Fecal Occult Bld POSITIVE (*)    All other components within normal limits  TROPONIN I  LACTATE DEHYDROGENASE  PROTIME-INR  MAGNESIUM  APTT  GLUCOSE, CAPILLARY  HAPTOGLOBIN  OCCULT BLOOD X 1 CARD TO LAB, STOOL  CREATININE, URINE, RANDOM  UREA NITROGEN, URINE  CBC  BASIC METABOLIC PANEL  TYPE AND SCREEN  ABO/RH  PREPARE RBC (CROSSMATCH)    EKG  EKG Interpretation  Date/Time:  Tuesday April 25 2017 17:41:44 EDT Ventricular Rate:  81 PR Interval:  156 QRS Duration: 86 QT Interval:  364 QTC Calculation: 422 R Axis:   77 Text Interpretation:  Normal sinus rhythm ST & T wave abnormality, consider inferolateral ischemia Abnormal ECG ST depression increased but chronic inferiorly and laterally Confirmed by Davonna Belling [phone]) on 04/25/2017 6:03:52 PM Also confirmed by Gareth Morgan [phone])  on 04/26/2017 1:49:51 AM       Radiology Dg Chest 2 View  Result Date: 04/25/2017 CLINICAL DATA:  Chest tightness. EXAM: CHEST  2 VIEW COMPARISON:  Radiographs of March 21, 2017. FINDINGS: The heart size and mediastinal contours are within normal limits. Both lungs are clear. No pneumothorax or pleural effusion is noted. No pneumothorax or pleural effusion is  noted. The visualized skeletal structures are unremarkable. IMPRESSION: No active cardiopulmonary disease. Electronically Signed   By: Marijo Conception, M.D.   On: 04/25/2017 16:07    Procedures Procedures (including critical care time)  Medications Ordered in ED Medications  0.9 %  sodium chloride infusion (not administered)  metoprolol succinate (TOPROL-XL) 24 hr tablet 25 mg (25 mg Oral Given 04/26/17 0944)  amitriptyline (ELAVIL) tablet 50 mg (not administered)  atorvastatin (LIPITOR) tablet 20 mg (20 mg Oral Given 04/26/17 1802)  digoxin (LANOXIN) tablet 0.125 mg (0.125 mg Oral Given 04/26/17 0944)  ALPRAZolam Duanne Moron) tablet 0.5 mg (0.5 mg Oral Given 04/26/17 1258)  levothyroxine (SYNTHROID, LEVOTHROID) tablet 75 mcg (75 mcg Oral Given 04/26/17 0745)  albuterol (PROVENTIL) (2.5 MG/3ML) 0.083% nebulizer solution 2.5 mg (not administered)  acetaminophen (TYLENOL) tablet 650 mg (not administered)    Or  acetaminophen (TYLENOL) suppository 650 mg (not  administered)  ondansetron (ZOFRAN) tablet 4 mg ( Oral See Alternative 04/26/17 1636)    Or  ondansetron (ZOFRAN) injection 4 mg (4 mg Intravenous Given 04/26/17 1636)  hydrALAZINE (APRESOLINE) injection 5 mg (not administered)  zolpidem (AMBIEN) tablet 5 mg (not administered)  ketotifen (ZADITOR) 0.025 % ophthalmic solution 1 drop (1 drop Both Eyes Given 04/26/17 0944)  metoCLOPramide (REGLAN) injection 10 mg (10 mg Intravenous Given 04/26/17 1802)    Followed by  metoCLOPramide (REGLAN) injection 10 mg (not administered)  bisacodyl (DULCOLAX) EC tablet 5 mg (5 mg Oral Given 04/26/17 1113)  pantoprazole (PROTONIX) EC tablet 40 mg (not administered)  peg 3350 powder (MOVIPREP) kit 100 g (100 g Oral Given 04/26/17 1757)    And  peg 3350 powder (MOVIPREP) kit 100 g (not administered)  0.9 %  sodium chloride infusion ( Intravenous New Bag/Given 04/26/17 1338)  ferric gluconate (NULECIT) 250 mg in sodium chloride 0.9 % 100 mL IVPB (0 mg Intravenous Stopped 04/26/17  1555)  potassium chloride SA (K-DUR,KLOR-CON) CR tablet 40 mEq (40 mEq Oral Given 04/26/17 0215)  potassium chloride 10 mEq in 100 mL IVPB (0 mEq Intravenous Stopped 04/26/17 1438)   CRITICAL CARE: anemia, transfusion in ED Performed by: Alvino Chapel   Total critical care time: 30 minutes  Critical care time was exclusive of separately billable procedures and treating other patients.  Critical care was necessary to treat or prevent imminent or life-threatening deterioration.  Critical care was time spent personally by me on the following activities: development of treatment plan with patient and/or surrogate as well as nursing, discussions with consultants, evaluation of patient's response to treatment, examination of patient, obtaining history from patient or surrogate, ordering and performing treatments and interventions, ordering and review of laboratory studies, ordering and review of radiographic studies, pulse oximetry and re-evaluation of patient's condition.   Initial Impression / Assessment and Plan / ED Course  I have reviewed the triage vital signs and the nursing notes.  Pertinent labs & imaging results that were available during my care of the patient were reviewed by me and considered in my medical decision making (see chart for details).     66yo female with history of htn, hlpd, CAD, paroxysmal a fib on xarelto, presents from PCP office with concern for severe dyspnea on exertion, fatigue, and anemia.  Patient hgb 5.3  Hemodynamically stable, no sign of acute bleeding by history and exam. Suspect more chronic loss, possible chronic GI loss.  Sent anemia labs. Ordered blood transfusion after discussion of risks.  Will admit for continued care.   Final Clinical Impressions(s) / ED Diagnoses   Final diagnoses:  Symptomatic anemia    New Prescriptions Current Discharge Medication List       Gareth Morgan, MD 04/26/17 [phone]

## 2017-04-26 NOTE — H&P (Signed)
History and Physical    Kaitlyn Adams AST:[id number] DOB: [birth year] DOA: 04/26/2017  Referring MD/NP/PA:   PCP: Midge Minium, MD   Patient coming from:  The patient is coming from home.  At baseline, pt is independent for most of ADL.      Chief Complaint: Shortness of breath, exertional chest tightness, generalized weakness  HPI: Kaitlyn Adams is a 66 y.o. female with medical history significant of hypertension, hyperlipidemia, asthma, GERD, hypothyroidism, depression with anxiety, PAF on Xarelto, fibromyalgia, mild CAD, iron deficiency anemia not taking iron supplements, who presents with shortness breath, exertional chest tightness and generalized weakness.  Patient states that she has been having shortness of breath, exertional chest tightness and generalized weakness for more than 2 months, which has been progressively getting worse. She also has acid reflux. No nausea, vomiting or abdominal pain. Pt states that has hx of hemorrhoids, last Wendsday she had small amt of bright red blood on tissue paper, which was similar to prior hemorrhoids, no black or bloody stools or severe bleeding. No hematuria hematemesis. Patient does not have symptoms of UTI or unilateral weakness. No cough, fever or chills.pt was seen by her PCP today, and was found to have low hemoglobin, and sent to ED for further evaluation and treatment.  ED Course: pt was found to have hemoglobin dropped from 12.4 on 07/20/16 to 5.3. WBC 12.0, potassium 3.0, acute renal injury with creatinine 1.31, pending FOBT, temperature normal, no tachycardia, oxygen saturation section 99% on room air. Negative chest x-ray. Patient is admitted to telemetry bed as inpatient.  Review of Systems:   General: no fevers, chills, no body weight gain, has poor appetite, has fatigue HEENT: no blurry vision, hearing changes or sore throat Respiratory: has dyspnea, no coughing, wheezing CV: has chest tightness, no  palpitations GI: no nausea, vomiting, abdominal pain, diarrhea, constipation. Has acid reflux  GU: no dysuria, burning on urination, increased urinary frequency, hematuria  Ext: no leg edema Neuro: no unilateral weakness, numbness, or tingling, no vision change or hearing loss Skin: no rash, no skin tear. MSK: No muscle spasm, no deformity, no limitation of range of movement in spin Heme: No easy bruising.  Travel history: No recent long distant travel.  Allergy:  Allergies  Allergen Reactions  . Codeine Itching    REACTION: itching  . Erythromycin Nausea And Vomiting    REACTION: nausea and vomiting  . Guaifenesin Er Palpitations  . Hydrocod Polst-Cpm Polst Er Other (See Comments)    Unknown reaction  . Sulfonamide Derivatives Nausea And Vomiting    REACTION: nausea and vomiting  . Tramadol Hcl Itching    REACTION: itching    Past Medical History:  Diagnosis Date  . Anemia   . Asthma    - normal spirometry 2009 FEV1  >90% predicted.  - methacoline challenge neg 11/09  . Bronchitis    recurrent  . Cataract   . Chronic cough    - sinus CT 03-23-10 > neg.  - allergy profile 03-23-10 > nl, IgE 14.  - flutter valve rx 03-23-10  . Fibromyalgia   . GERD (gastroesophageal reflux disease)    exacerbating VCD  . Glaucoma   . Hyperlipidemia   . Hypertension   . Hyperthyroidism    with hot nodule.  - total thyroidectomy 11-05-08 benign -> subsequent hypothyroidism.  Marland Kitchen Hypokalemia   . Hypothyroidism   . Hypothyroidism    a. following thyroidectomy.  . Mild CAD    a. LHC 04/2015 -  mild nonobstructive CAD with 30% dLAD, 40% D1, 25% pLCx, 30% mRCA, 30% RPDA, normal EF >65% with normal LVEDP.  . Orthostatic hypotension   . Osteoarthritis   . Paroxysmal atrial fibrillation (HCC)   . Renal insufficiency   . Vasomotor rhinitis    exacerbates VCD  . Vocal cord dysfunction    proven on FOB 9/09    Past Surgical History:  Procedure Laterality Date  . ABDOMINAL HYSTERECTOMY  1980  .  ABDOMINAL HYSTERECTOMY    . BASAL CELL CARCINOMA EXCISION  11/08  . CARDIAC CATHETERIZATION N/A 05/15/2015   Procedure: Left Heart Cath and Coronary Angiography;  Surgeon: Sherren Mocha, MD;  Location: [place] [facility];  Service: Cardiovascular;  Laterality: N/A;  . EYE SURGERY    . scar tissue removal  1982  . THYROID SURGERY  2010  . THYROIDECTOMY  11-05-08    Social History:  reports that she has never smoked. She has never used smokeless tobacco. She reports that she drinks about 0.6 oz of alcohol per week . She reports that she does not use drugs.  Family History:  Family History  Problem Relation Age of Onset  . Asthma Mother   . Heart disease Mother   . Emphysema Father   . Heart disease Father   . Asthma Grandchild      Prior to Admission medications   Medication Sig Start Date End Date Taking? Authorizing Provider  Acetaminophen (TYLENOL ARTHRITIS PAIN PO) Take 325 mg by mouth as needed (FOR MILD PAIN).     [provider]  albuterol (PROVENTIL HFA;VENTOLIN HFA) 108 (90 Base) MCG/ACT inhaler Inhale 2 puffs into the lungs every 6 (six) hours as needed for wheezing or shortness of breath. 03/29/17   Midge Minium, MD  ALPRAZolam Duanne Moron) 0.5 MG tablet Take 1 tablet (0.5 mg total) by mouth 2 (two) times daily as needed for anxiety. 05/18/16   Midge Minium, MD  amitriptyline (ELAVIL) 50 MG tablet TAKE 1 TABLET(50 MG) BY MOUTH AT BEDTIME 03/16/17   Midge Minium, MD  atorvastatin (LIPITOR) 20 MG tablet TAKE 1 TABLET(20 MG) BY MOUTH DAILY 03/16/17   Midge Minium, MD  DIGOX 125 MCG tablet TAKE 1 TABLET(0.125 MG) BY MOUTH DAILY 03/16/17   Martinique, Peter M, MD  levothyroxine (SYNTHROID, LEVOTHROID) 75 MCG tablet Take 1 tablet (75 mcg total) by mouth daily. 11/19/15   Midge Minium, MD  metoprolol succinate (TOPROL-XL) 25 MG 24 hr tablet TAKE 1 TABLET(25 MG) BY MOUTH DAILY 03/20/17   Midge Minium, MD  omeprazole (PRILOSEC) 40 MG capsule TAKE 1  CAPSULE(40 MG) BY MOUTH DAILY 04/13/17   Midge Minium, MD  Rivaroxaban (XARELTO) 15 MG TABS tablet Take 1 tablet (15 mg total) by mouth daily with supper. 12/13/16   Lelon Perla, MD  spironolactone (ALDACTONE) 25 MG tablet TAKE 1/2 TABLET(12.5 MG) BY MOUTH DAILY 03/16/17   Midge Minium, MD  Vitamin D, Ergocalciferol, (DRISDOL) 50000 units CAPS capsule Take 50,000 Units by mouth every 7 (seven) days.    [provider]    Physical Exam: Vitals:   04/26/17 0146 04/26/17 0200 04/26/17 0209 04/26/17 0245  BP: (!) 147/66 (!) 143/61 140/60 (!) 150/64  Pulse: 82 75  73  Resp: 20 20  19   Temp: 98.4 F (36.9 C)  98.9 F (37.2 C)   TempSrc:   Oral   SpO2:  100%  100%  Weight:      Height:  General: Not in acute distress. Pale looking. HEENT:       Eyes: PERRL, EOMI, no scleral icterus.       ENT: No discharge from the ears and nose, no pharynx injection, no tonsillar enlargement.        Neck: No JVD, no bruit, no mass felt. Heme: No neck lymph node enlargement. Cardiac: S1/S2, RRR, No murmurs, No gallops or rubs. Respiratory: No rales, wheezing, rhonchi or rubs. GI: Soft, nondistended, nontender, no rebound pain, no organomegaly, BS present. GU: No hematuria Ext: No pitting leg edema bilaterally. 2+DP/PT pulse bilaterally. Musculoskeletal: No joint deformities, No joint redness or warmth, no limitation of ROM in spin. Skin: No rashes.  Neuro: Alert, oriented X3, cranial nerves II-XII grossly intact, moves all extremities normally. Psych: Patient is not psychotic, no suicidal or hemocidal ideation.  Labs on Admission: I have personally reviewed following labs and imaging studies  CBC:  Recent Labs Lab 04/25/17 1517 04/25/17 2347  WBC 11.5* 12.0*  NEUTROABS 8.3* 7.9*  HGB 5.0 Repeated and verified X2.* 5.3*  HCT 18.5 Repeated and verified X2.* 20.8*  MCV 53.5* 56.1*  PLT 688.0* 272*   Basic Metabolic Panel:  Recent Labs Lab 04/25/17 2347  NA  136  K 3.0*  CL 108  CO2 20*  GLUCOSE 99  BUN 14  CREATININE 1.31*  CALCIUM 9.4   GFR: Estimated Creatinine Clearance: 32.8 mL/min (A) (by C-G formula based on SCr of 1.31 mg/dL (H)). Liver Function Tests:  Recent Labs Lab 04/25/17 2347  AST 19  ALT 15  ALKPHOS 98  BILITOT 0.5  PROT 7.0  ALBUMIN 3.9   No results for input(s): LIPASE, AMYLASE in the last 168 hours. No results for input(s): AMMONIA in the last 168 hours. Coagulation Profile: No results for input(s): INR, PROTIME in the last 168 hours. Cardiac Enzymes:  Recent Labs Lab 04/25/17 2347  TROPONINI <0.03   BNP (last 3 results) No results for input(s): PROBNP in the last 8760 hours. HbA1C: No results for input(s): HGBA1C in the last 72 hours. CBG: No results for input(s): GLUCAP in the last 168 hours. Lipid Profile: No results for input(s): CHOL, HDL, LDLCALC, TRIG, CHOLHDL, LDLDIRECT in the last 72 hours. Thyroid Function Tests: No results for input(s): TSH, T4TOTAL, FREET4, T3FREE, THYROIDAB in the last 72 hours. Anemia Panel:  Recent Labs  04/25/17 0145  FERRITIN 2*  TIBC 552*  IRON 13*   Urine analysis:    Component Value Date/Time   COLORURINE ORANGE BIOCHEMICALS MAY BE AFFECTED BY COLOR (A) 04/01/2009 1425   APPEARANCEUR HAZY (A) 04/01/2009 1425   LABSPEC 1.034 (H) 04/01/2009 1425   PHURINE 6.0 04/01/2009 1425   GLUCOSEU NEGATIVE 04/01/2009 1425   HGBUR NEGATIVE 04/01/2009 1425   BILIRUBINUR SMALL (A) 04/01/2009 1425   KETONESUR 15 (A) 04/01/2009 1425   PROTEINUR 30 (A) 04/01/2009 1425   UROBILINOGEN 1.0 04/01/2009 1425   NITRITE NEGATIVE 04/01/2009 1425   LEUKOCYTESUR SMALL (A) 04/01/2009 1425   Sepsis Labs: @LABRCNTIP (procalcitonin:4,lacticidven:4) )No results found for this or any previous visit (from the past 240 hour(s)).   Radiological Exams on Admission: Dg Chest 2 View  Result Date: 04/25/2017 CLINICAL DATA:  Chest tightness. EXAM: CHEST  2 VIEW COMPARISON:  Radiographs  of March 21, 2017. FINDINGS: The heart size and mediastinal contours are within normal limits. Both lungs are clear. No pneumothorax or pleural effusion is noted. No pneumothorax or pleural effusion is noted. The visualized skeletal structures are unremarkable. IMPRESSION: No active cardiopulmonary  disease. Electronically Signed   By: Marijo Conception, M.D.   On: 04/25/2017 16:07     EKG: Independently reviewed. Sinus rhythm, QTC 422, T-wave inversion in inferior leads and V4-V6.  Assessment/Plan Principal Problem:   Symptomatic anemia Active Problems:   Essential hypertension   Paroxysmal atrial fibrillation (HCC)   Hypothyroidism   Hypokalemia   CAD (coronary artery disease)   Anxiety and depression   Iron deficiency anemia   Chronic anticoagulation   AKI (acute kidney injury) (HCC)   Symptomatic anemia: Hemoglobin dropped from 12.4 on 07/20/16-->5.3 today. Pending FOBT. Pt states that has hx of hemorrhoids, last Wendsday she had small amt of bright red blood on tissue paper, which was similar to prior hemorrhoids, no black or bloody stools or severe bleeding. Pt had EGD on 11/20/01 by Dr. Earlean Shawl, which showed hemorrhage (Ozzing in gastric body and antrum). Colonoscopy by Dr. Earlean Shawl on 12/20/01 showed sessile polyp which was removed. Pt may have chronic slow GI loss of blood in the setting of Xarelto use  - will admit to tele bed as inpt - transfuse 2U of blood - NPO - IVF: NS at 100 mL/hr - Start IV pantoprazole gtt - hold Xarelto - Avoid NSAIDs and SQ heparin - Maintain IV access (2 large bore IVs if possible). - Monitor closely and follow q6h cbc, transfuse as necessary. - LaB: INR, PTT and type screen - f/u FOBT. If positive-->please call GI in AM  HTN: -Hold her spironolactone due to acute renal injury -Continue metoprolol -IVF hydralazine when necessary  PAF: CHA2DS2-VASc Score is 4, needs oral anticoagulation. Patient is Xarelto at home. Heart rate is well  controlled. -hold Xarelto -Continue metoprolol and digoxin -check digoxin level  Hypothyroidism: Last TSH was on 2.32 on 11/18/15 -Continue home Synthroid  Hypokalemia: K= 3.0  on admission. - Repleted - Check Mg level  Hx of CAD: Patient had chest tightness, which has resolved. Most likely due to demand ischemia secondary to severe anemia. -Follow-up troponin 3 -continue metoprolol and Lipitor  Depression and anxiety: Stable, no suicidal or homicidal ideations. -Continue home medications: Amitriptyline, sinus,  Iron deficiency anemia: -f/u iron study  AKI: Likely due to prerenal secondary to dehydration and continuation of diuretics - IVF as above - Check FeUrea - Follow up renal function by BMP - Hold spironolactone spironolactone.   DVT ppx: SCD Code Status: Full code Family Communication:  Yes, patient's husband at bed side Disposition Plan:  Anticipate discharge back to previous home environment Consults called:  none Admission status:  Inpatient/tele    Date of Service 04/26/2017    Ivor Costa [facility] Pager [phone]  If 7PM-7AM, please contact night-coverage [email or web address] Password [email or web address] 04/26/2017, 3:04 AM

## 2017-04-26 NOTE — Progress Notes (Signed)
MEDICATION RELATED CONSULT NOTE - INITIAL   Pharmacy Consult for IV iron Indication: anemia  Allergies  Allergen Reactions  . Codeine Itching    REACTION: itching  . Erythromycin Nausea And Vomiting    REACTION: nausea and vomiting  . Guaifenesin Er Palpitations  . Hydrocod Polst-Cpm Polst Er Other (See Comments)    Unknown reaction  . Sulfonamide Derivatives Nausea And Vomiting    REACTION: nausea and vomiting  . Tramadol Hcl Itching    REACTION: itching     Vital Signs: Temp: 98.6 F (37 C) (09/05 0847) Temp Source: Oral (09/05 0847) BP: 138/55 (09/05 0847) Pulse Rate: 80 (09/05 0847)  Labs:  Recent Labs  04/25/17 2347 04/26/17 0455 04/26/17 1037  WBC 12.0* 8.1 8.6  HGB 5.3* 6.8* 8.6*  HCT 20.8* 25.2* 30.1*  PLT 688* 575* 555*  APTT  --  31  --   CREATININE 1.31* 1.22*  --   MG  --  2.1  --   ALBUMIN 3.9  --   --   PROT 7.0  --   --   AST 19  --   --   ALT 15  --   --   ALKPHOS 98  --   --   BILITOT 0.5  --   --    Estimated Creatinine Clearance: 36.5 mL/min (A) (by C-G formula based on SCr of 1.22 mg/dL (H)).  Assessment: 62 YOF with history of iron deficiency anemia who was admitted with FOBT+ and low Hgb. She is on Xarelto PTA for AFib. Per report, she has GI upset from oral iron. Iron low at 13, TSat low at 2%, and ferritin low at 2.  Has received 2 units of blood so far. Hgb 8.6 (5.3 on admission).  Plan:  -Due to iron dextran backorder, will give ferric gluconate 250mg  IV daily x4 doses for a total of 1g of repletion -Recommend patient be places on PO iron regimen along with bowel regimen. She could also take iron with food to ease GI upset (though this would decrease absorption of iron), or take with an acidic juice such as orange juice to help absorption while minimizing empty stomach/possible GI upset -pharmacy to sign off as evidence of iron repletion >1g is not associated with better outcomes. Recommend follow up iron panel in 1  month  Shaquita Fort D. Raimi Guillermo, PharmD, BCPS Clinical Pharmacist Pager: [phone] Clinical Phone for 04/26/2017 until 3:30pm: [phone] If after 3:30pm, please call main pharmacy at [phone] 04/26/2017 1:45 PM

## 2017-04-27 ENCOUNTER — Encounter (HOSPITAL_COMMUNITY)
Admission: EM | Disposition: A | Discharge status: Home / Self Care | Payer: Self-pay | Source: Home / Self Care | Attending: Internal Medicine

## 2017-04-27 ENCOUNTER — Inpatient Hospital Stay (HOSPITAL_COMMUNITY): Payer: Medicare Other | Admitting: Certified Registered"

## 2017-04-27 ENCOUNTER — Encounter (HOSPITAL_COMMUNITY): Payer: Self-pay | Admitting: *Deleted

## 2017-04-27 DIAGNOSIS — D120 Benign neoplasm of cecum: Secondary | ICD-10-CM

## 2017-04-27 DIAGNOSIS — K449 Diaphragmatic hernia without obstruction or gangrene: Secondary | ICD-10-CM

## 2017-04-27 DIAGNOSIS — D123 Benign neoplasm of transverse colon: Secondary | ICD-10-CM

## 2017-04-27 DIAGNOSIS — E876 Hypokalemia: Secondary | ICD-10-CM

## 2017-04-27 DIAGNOSIS — K6389 Other specified diseases of intestine: Principal | ICD-10-CM

## 2017-04-27 DIAGNOSIS — K648 Other hemorrhoids: Secondary | ICD-10-CM

## 2017-04-27 DIAGNOSIS — K635 Polyp of colon: Secondary | ICD-10-CM

## 2017-04-27 DIAGNOSIS — E872 Acidosis: Secondary | ICD-10-CM

## 2017-04-27 DIAGNOSIS — B3781 Candidal esophagitis: Secondary | ICD-10-CM

## 2017-04-27 HISTORY — PX: COLONOSCOPY: SHX5424

## 2017-04-27 HISTORY — PX: ESOPHAGOGASTRODUODENOSCOPY: SHX5428

## 2017-04-27 LAB — CBC
HCT: 28.6 % — ABNORMAL LOW (ref 36.0–46.0)
HEMATOCRIT: 32.3 % — AB (ref 36.0–46.0)
HEMATOCRIT: 31.8 % — AB (ref 36.0–46.0)
HEMOGLOBIN: 7.9 g/dL — AB (ref 12.0–15.0)
Hemoglobin: 9.2 g/dL — ABNORMAL LOW (ref 12.0–15.0)
Hemoglobin: 8.8 g/dL — ABNORMAL LOW (ref 12.0–15.0)
MCH: 18.4 pg — ABNORMAL LOW (ref 26.0–34.0)
MCH: 18.2 pg — ABNORMAL LOW (ref 26.0–34.0)
MCH: 18.3 pg — ABNORMAL LOW (ref 26.0–34.0)
MCHC: 28.5 g/dL — ABNORMAL LOW (ref 30.0–36.0)
MCHC: 27.7 g/dL — AB (ref 30.0–36.0)
MCHC: 27.6 g/dL — AB (ref 30.0–36.0)
MCV: 65.9 fL — ABNORMAL LOW (ref 78.0–100.0)
MCV: 64.7 fL — AB (ref 78.0–100.0)
MCV: 66 fL — ABNORMAL LOW (ref 78.0–100.0)
PLATELETS: 605 10*3/uL — AB (ref 150–400)
Platelets: 507 10*3/uL — ABNORMAL HIGH (ref 150–400)
Platelets: 554 10*3/uL — ABNORMAL HIGH (ref 150–400)
RBC: 4.34 MIL/uL (ref 3.87–5.11)
RBC: 4.99 MIL/uL (ref 3.87–5.11)
RBC: 4.82 MIL/uL (ref 3.87–5.11)
RDW: 27.8 % — ABNORMAL HIGH (ref 11.5–15.5)
RDW: 27.6 % — ABNORMAL HIGH (ref 11.5–15.5)
RDW: 27.9 % — AB (ref 11.5–15.5)
WBC: 8.1 10*3/uL (ref 4.0–10.5)
WBC: 17.4 10*3/uL — AB (ref 4.0–10.5)
WBC: 12.2 10*3/uL — AB (ref 4.0–10.5)

## 2017-04-27 LAB — TYPE AND SCREEN
ABO/RH(D): O POS
Antibody Screen: NEGATIVE
Unit division: 0
Unit division: 0

## 2017-04-27 LAB — BPAM RBC
Blood Product Expiration Date: 201810022359
Blood Product Expiration Date: 201810022359
ISSUE DATE / TIME: 201809050123
ISSUE DATE / TIME: 201809050528
Unit Type and Rh: 5100
Unit Type and Rh: 5100

## 2017-04-27 LAB — BASIC METABOLIC PANEL
Anion gap: 9 (ref 5–15)
BUN: 9 mg/dL (ref 6–20)
CO2: 17 mmol/L — ABNORMAL LOW (ref 22–32)
Calcium: 8.2 mg/dL — ABNORMAL LOW (ref 8.9–10.3)
Chloride: 114 mmol/L — ABNORMAL HIGH (ref 101–111)
Creatinine, Ser: 1.15 mg/dL — ABNORMAL HIGH (ref 0.44–1.00)
GFR calc Af Amer: 56 mL/min — ABNORMAL LOW (ref >60)
GFR, EST NON AFRICAN AMERICAN: 48 mL/min — AB (ref >60)
GLUCOSE: 76 mg/dL (ref 65–99)
POTASSIUM: 3 mmol/L — AB (ref 3.5–5.1)
Sodium: 140 mmol/L (ref 135–145)

## 2017-04-27 SURGERY — COLONOSCOPY
Anesthesia: Monitor Anesthesia Care

## 2017-04-27 MED ORDER — POTASSIUM CHLORIDE 10 MEQ/100ML IV SOLN
10.0000 meq | INTRAVENOUS | Status: DC
  Administered 2017-04-27: 10 meq via INTRAVENOUS
  Filled 2017-04-27: qty 100

## 2017-04-27 MED ORDER — POTASSIUM CHLORIDE CRYS ER 20 MEQ PO TBCR
40.0000 meq | EXTENDED_RELEASE_TABLET | Freq: Once | ORAL | Status: AC
  Administered 2017-04-27: 40 meq via ORAL
  Filled 2017-04-27: qty 2

## 2017-04-27 MED ORDER — SODIUM BICARBONATE 650 MG PO TABS
650.0000 mg | ORAL_TABLET | Freq: Two times a day (BID) | ORAL | Status: DC
  Administered 2017-04-27 – 2017-04-28 (×3): 650 mg via ORAL
  Filled 2017-04-27 (×3): qty 1

## 2017-04-27 MED ORDER — PROPOFOL 10 MG/ML IV BOLUS
INTRAVENOUS | Status: DC | PRN
  Administered 2017-04-27 (×5): 20 mg via INTRAVENOUS

## 2017-04-27 MED ORDER — FLUCONAZOLE 100 MG PO TABS
100.0000 mg | ORAL_TABLET | Freq: Every day | ORAL | Status: DC
  Administered 2017-04-27 – 2017-04-28 (×2): 100 mg via ORAL
  Filled 2017-04-27 (×2): qty 1

## 2017-04-27 MED ORDER — PROPOFOL 500 MG/50ML IV EMUL
INTRAVENOUS | Status: DC | PRN
  Administered 2017-04-27: 75 ug/kg/min via INTRAVENOUS
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
## 2017-04-27 NOTE — Op Note (Signed)
[facility] Patient Name: Kaitlyn Adams Procedure Date : 04/27/2017 MRN: [id number] Attending MD: Mauri Pole , MD Date of Birth: April 24, 1951 CSN: [id number] Age: 66 Admit Type: Inpatient Procedure:                Colonoscopy Indications:              Unexplained iron deficiency anemia Providers:                Mauri Pole, MD, Burtis Junes, RN, Tinnie Gens, Technician Referring MD:              Medicines:                Monitored Anesthesia Care Complications:            No immediate complications. Estimated Blood Loss:     Estimated blood loss was minimal. Procedure:                Pre-Anesthesia Assessment:                           - Prior to the procedure, a History and Physical                            was performed, and patient medications and                            allergies were reviewed. The patient's tolerance of                            previous anesthesia was also reviewed. The risks                            and benefits of the procedure and the sedation                            options and risks were discussed with the patient.                            All questions were answered, and informed consent                            was obtained. Prior Anticoagulants: The patient has                            taken no previous anticoagulant or antiplatelet                            agents. ASA Grade Assessment: III - A patient with                            severe systemic disease. After reviewing the risks  and benefits, the patient was deemed in                            satisfactory condition to undergo the procedure.                           - Prior to the procedure, a History and Physical                            was performed, and patient medications and                            allergies were reviewed. The patient's tolerance of                            previous  anesthesia was also reviewed. The risks                            and benefits of the procedure and the sedation                            options and risks were discussed with the patient.                            All questions were answered, and informed consent                            was obtained. Prior Anticoagulants: The patient                            last took Xarelto (rivaroxaban) 3 days prior to the                            procedure. ASA Grade Assessment: III - A patient                            with severe systemic disease. After reviewing the                            risks and benefits, the patient was deemed in                            satisfactory condition to undergo the procedure.                           After obtaining informed consent, the colonoscope                            was passed under direct vision. Throughout the                            procedure, the patient's blood pressure, pulse, and  oxygen saturations were monitored continuously. The                            [id number] ([id number]) scope was introduced through                            the anus and advanced to the the cecum, identified                            by appendiceal orifice and ileocecal valve. The                            colonoscopy was performed without difficulty. The                            patient tolerated the procedure well. The quality                            of the bowel preparation was good. The ileocecal                            valve, appendiceal orifice, and rectum were                            photographed. Scope In: 12:42:32 PM Scope Out: 1:06:09 PM Scope Withdrawal Time: 0 hours 13 minutes 15 seconds  Total Procedure Duration: 0 hours 23 minutes 37 seconds  Findings:      The perianal and digital rectal examinations were normal.      A diffuse area of moderate melanosis was found in the entire colon.      The  ileocecal valve was mildly lipomatous. Biopsies were taken with a       cold forceps for histology.      A 10 mm polyp was found in the transverse colon. The polyp was       semi-pedunculated. The polyp was removed with a hot snare. Resection and       retrieval were complete.      A few small-mouthed diverticula were found in the sigmoid colon and       descending colon.      Non-bleeding internal hemorrhoids were found during retroflexion. The       hemorrhoids were small.      The exam was otherwise without abnormality. Impression:               - Melanosis in the colon.                           - Lipomatous ileocecal valve. Biopsied.                           - One 10 mm polyp in the transverse colon, removed                            with a hot snare. Resected and retrieved.                           -  Diverticulosis in the sigmoid colon and in the                            descending colon.                           - Non-bleeding internal hemorrhoids.                           - The examination was otherwise normal. Moderate Sedation:      N/A Recommendation:           - Patient has a contact number available for                            emergencies. The signs and symptoms of potential                            delayed complications were discussed with the                            patient. Return to normal activities tomorrow.                            Written discharge instructions were provided to the                            patient.                           - Resume previous diet.                           - Continue present medications.                           - Await pathology results.                           - Resume Xarelto (rivaroxaban) at prior dose today.                            Refer to managing physician for further adjustment                            of therapy.                           - Repeat colonoscopy in 3-10 years for surveillance                             based on pathology results.                           - Return to GI clinic at the next available  appointment. Procedure Code(s):        --- Professional ---                           [phone], Colonoscopy, flexible; with removal of                            tumor(s), polyp(s), or other lesion(s) by snare                            technique                           45380, 51, Colonoscopy, flexible; with biopsy,                            single or multiple Diagnosis Code(s):        --- Professional ---                           K63.89, Other specified diseases of intestine                           D12.3, Benign neoplasm of transverse colon (hepatic                            flexure or splenic flexure)                           K64.8, Other hemorrhoids                           D50.9, Iron deficiency anemia, unspecified                           K57.30, Diverticulosis of large intestine without                            perforation or abscess without bleeding CPT copyright 2016 American Medical Association. All rights reserved. The codes documented in this report are preliminary and upon coder review may  be revised to meet current compliance requirements. Mauri Pole, MD 04/27/2017 1:17:41 PM This report has been signed electronically. Number of Addenda: 0

## 2017-04-27 NOTE — Anesthesia Preprocedure Evaluation (Addendum)
Anesthesia Evaluation  Patient identified by MRN, date of birth, ID band Patient awake    Reviewed: Allergy & Precautions, H&P , NPO status , Patient's Chart, lab work & pertinent test results, reviewed documented beta blocker date and time   Airway Mallampati: II  TM Distance: >3 FB Neck ROM: Full    Dental no notable dental hx. (+) Teeth Intact, Dental Advisory Given   Pulmonary asthma ,    Pulmonary exam normal breath sounds clear to auscultation       Cardiovascular hypertension, Pt. on medications and Pt. on home beta blockers + CAD  + dysrhythmias Atrial Fibrillation  Rhythm:Regular Rate:Normal     Neuro/Psych negative neurological ROS  negative psych ROS   GI/Hepatic Neg liver ROS, GERD  Medicated and Controlled,  Endo/Other  Hypothyroidism   Renal/GU Renal InsufficiencyRenal diseasenegative Renal ROS  negative genitourinary   Musculoskeletal  (+) Arthritis , Osteoarthritis,  Fibromyalgia -  Abdominal   Peds  Hematology negative hematology ROS (+) anemia ,   Anesthesia Other Findings   Reproductive/Obstetrics negative OB ROS                           Anesthesia Physical Anesthesia Plan  ASA: III  Anesthesia Plan: MAC   Post-op Pain Management:    Induction: Intravenous  PONV Risk Score and Plan: 2 and Propofol infusion and Treatment may vary due to age or medical condition  Airway Management Planned: Nasal Cannula  Additional Equipment:   Intra-op Plan:   Post-operative Plan:   Informed Consent: I have reviewed the patients History and Physical, chart, labs and discussed the procedure including the risks, benefits and alternatives for the proposed anesthesia with the patient or authorized representative who has indicated his/her understanding and acceptance.   Dental advisory given  Plan Discussed with: CRNA  Anesthesia Plan Comments:         Anesthesia Quick  Evaluation

## 2017-04-27 NOTE — Transfer of Care (Signed)
Immediate Anesthesia Transfer of Care Note  Patient: Kaitlyn Adams  Procedure(s) Performed: Procedure(s): COLONOSCOPY (N/A) ESOPHAGOGASTRODUODENOSCOPY (EGD) (N/A)  Patient Location: Endoscopy Unit  Anesthesia Type:MAC  Level of Consciousness: awake, alert  and oriented  Airway & Oxygen Therapy: Patient Spontanous Breathing  Post-op Assessment: Report given to RN  Post vital signs: Reviewed and stable  Last Vitals:  Vitals:   04/27/17 0900 04/27/17 1152  BP: (!) 106/50 (!) 142/63  Pulse: 62 73  Resp: 18 17  Temp: (!) 36.4 C 36.9 C  SpO2: 100% 100%    Last Pain:  Vitals:   04/27/17 1152  TempSrc: Oral  PainSc:          Complications: No apparent anesthesia complications

## 2017-04-27 NOTE — Op Note (Addendum)
[facility] Patient Name: Kaitlyn Adams Procedure Date : 04/27/2017 MRN: [id number] Attending MD: Mauri Pole , MD Date of Birth: 01-05-1951 CSN: [id number] Age: 66 Admit Type: Inpatient Procedure:                Upper GI endoscopy Indications:              Suspected upper gastrointestinal bleeding in                            patient with unexplained iron deficiency anemia Providers:                Mauri Pole, MD, Burtis Junes, RN, Tinnie Gens, Technician Referring MD:              Medicines:                Monitored Anesthesia Care Complications:            No immediate complications. Estimated Blood Loss:     Estimated blood loss was minimal. Procedure:                Pre-Anesthesia Assessment:                           - Prior to the procedure, a History and Physical                            was performed, and patient medications and                            allergies were reviewed. The patient's tolerance of                            previous anesthesia was also reviewed. The risks                            and benefits of the procedure and the sedation                            options and risks were discussed with the patient.                            All questions were answered, and informed consent                            was obtained. Prior Anticoagulants: The patient                            last took Xarelto (rivaroxaban) 3 days prior to the                            procedure. ASA Grade Assessment: III - A patient  with severe systemic disease. After reviewing the                            risks and benefits, the patient was deemed in                            satisfactory condition to undergo the procedure.                           After obtaining informed consent, the endoscope was                            passed under direct vision. Throughout the           procedure, the patient's blood pressure, pulse, and                            oxygen saturations were monitored continuously. The                            [id number] ([id number]) scope was introduced through the                            mouth, and advanced to the second part of duodenum.                            The upper GI endoscopy was accomplished without                            difficulty. The patient tolerated the procedure                            well. Scope In: Scope Out: Findings:      Mildly severe esophagitis with no bleeding was found 25 to 35 cm from       the incisors. Biopsies were taken with a cold forceps for histology.      A small hiatal hernia was present.      The cardia and gastric fundus were normal on retroflexion.      The examined duodenum was normal. Impression:               - Mildly severe monilial and candidiasis                            esophagitis. Biopsied.                           - Small hiatal hernia.                           - Normal examined duodenum. Moderate Sedation:      N/A Recommendation:           - Resume previous diet.                           - Continue present medications.                           -  Await pathology results.                           - No ibuprofen, naproxen, or other non-steroidal                            anti-inflammatory drugs.                           - Diflucan (fluconazole) 100 mg PO daily for 10                            days. Procedure Code(s):        --- Professional ---                           [phone], Esophagogastroduodenoscopy, flexible,                            transoral; with biopsy, single or multiple Diagnosis Code(s):        --- Professional ---                           B37.81, Candidal esophagitis                           K44.9, Diaphragmatic hernia without obstruction or                            gangrene                           D50.9, Iron deficiency anemia,  unspecified CPT copyright 2016 American Medical Association. All rights reserved. The codes documented in this report are preliminary and upon coder review may  be revised to meet current compliance requirements. Mauri Pole, MD 04/27/2017 1:13:45 PM This report has been signed electronically. Number of Addenda: 0

## 2017-04-27 NOTE — Anesthesia Procedure Notes (Signed)
Procedure Name: MAC Date/Time: 04/27/2017 12:25 PM Performed by: Barrington Ellison Pre-anesthesia Checklist: Patient identified, Emergency Drugs available, Suction available, Patient being monitored and Timeout performed Patient Re-evaluated:Patient Re-evaluated prior to induction Oxygen Delivery Method: Nasal cannula

## 2017-04-27 NOTE — Anesthesia Postprocedure Evaluation (Signed)
Anesthesia Post Note  Patient: MAKENDRA VIGEANT  Procedure(s) Performed: Procedure(s) (LRB): COLONOSCOPY (N/A) ESOPHAGOGASTRODUODENOSCOPY (EGD) (N/A)     Patient location during evaluation: PACU Anesthesia Type: MAC Level of consciousness: awake and alert Pain management: pain level controlled Vital Signs Assessment: post-procedure vital signs reviewed and stable Respiratory status: spontaneous breathing, nonlabored ventilation and respiratory function stable Cardiovascular status: stable and blood pressure returned to baseline Anesthetic complications: no    Last Vitals:  Vitals:   04/27/17 1325 04/27/17 1330  BP:  (!) 116/51  Pulse: 63   Resp: 16   Temp:    SpO2: 100%     Last Pain:  Vitals:   04/27/17 1152  TempSrc: Oral  PainSc:                  Jeremey Bascom,W. EDMOND

## 2017-04-27 NOTE — Progress Notes (Signed)
Daily Rounding Note  04/27/2017, 8:54 AM  LOS: 1 day   SUBJECTIVE:   Chief complaint:  Pain in arm wher IV Potassium is infusing Completed Movinprep this AM, no problems.  Stool clear/looks like urine.       OBJECTIVE:         Vital signs in last 24 hours:    Temp:  [97.4 F (36.3 C)-98 F (36.7 C)] 97.4 F (36.3 C) (09/06 0508) Pulse Rate:  [66-81] 66 (09/06 0508) Resp:  [18] 18 (09/06 0508) BP: (123-124)/(60-67) 123/60 (09/06 0508) SpO2:  [100 %] 100 % (09/06 0508) Weight:  [50.9 kg (112 lb 4.8 oz)] 50.9 kg (112 lb 4.8 oz) (09/05 2055) Last BM Date: 04/27/17 Filed Weights   04/25/17 1751 04/26/17 0426 04/26/17 2055  Weight: 49.9 kg (110 lb) 51.7 kg (114 lb) 50.9 kg (112 lb 4.8 oz)   General: looks a bit pale   Heart: RRR Chest: clear bil.  No cough or labored breathing Abdomen: soft, NT, active BS  Extremities: no CCE Neuro/Psych:  Oriented x 3.  No deficits.  Anxious.   Intake/Output from previous day: 09/05 0701 - 09/06 0700 In: 2615 [P.O.:600; I.V.:1000; Blood:315; IV Piggyback:700] Out: 1000 [Urine:1000]  Intake/Output this shift: No intake/output data recorded.  Lab Results:  Recent Labs  04/26/17 1653 04/26/17 1846 04/27/17 0031  WBC 18.9* 19.1* 17.4*  HGB 13.6 12.2 9.2*  HCT 46.2* 42.4 32.3*  PLT QUESTIONABLE RESULTS, RECOMMEND RECOLLECT TO VERIFY PLATELET CLUMPS NOTED ON SMEAR, COUNT APPEARS INCREASED 605*   BMET  Recent Labs  04/26/17 0455 04/26/17 1653 04/27/17 0447  NA 135 137 140  K 2.9* 3.8 3.0*  CL 107 113* 114*  CO2 19* 15* 17*  GLUCOSE 95 97 76  BUN 11 9 9   CREATININE 1.22* 1.08* 1.15*  CALCIUM 8.9 8.7* 8.2*   LFT  Recent Labs  04/25/17 2347  PROT 7.0  ALBUMIN 3.9  AST 19  ALT 15  ALKPHOS 98  BILITOT 0.5   PT/INR  Recent Labs  04/26/17 0455  LABPROT 14.6  INR 1.15   Hepatitis Panel No results for input(s): HEPBSAG, HCVAB, HEPAIGM, HEPBIGM in the last  72 hours.  Studies/Results: Dg Chest 2 View  Result Date: 04/25/2017 CLINICAL DATA:  Chest tightness. EXAM: CHEST  2 VIEW COMPARISON:  Radiographs of March 21, 2017. FINDINGS: The heart size and mediastinal contours are within normal limits. Both lungs are clear. No pneumothorax or pleural effusion is noted. No pneumothorax or pleural effusion is noted. The visualized skeletal structures are unremarkable. IMPRESSION: No active cardiopulmonary disease. Electronically Signed   By: Marijo Conception, M.D.   On: 04/25/2017 16:07    ASSESMENT:   *  Progressive, iron deficiency anemia, present for at least one year. Intolerant of oral iron. FOBT positive.  Minor recent, scant bleeding PR is not responsible for her anemia.  Adenomatous colon polyp in 2003, no repeat surveillance. R/o recurrent polyp or neoplasia/cancer.  R/o AVMs.  GERD is not well controlled with once daily PPI, + anorexia, decreased PO intake, weight loss over several months. R/o ulcer disease, upper intestinal neoplasia.  S/p PRBC x 2.   Hgb 5 >> 6.8 >> 8.6 >> 13.6 >> 12.2 >> 9.2.   Pharm D overseeing parenteral iron, ferric gluconate.  Has received 1 of 3 doses. Also outlines strategies to help her tolerate po iron.  Suggests f/up iron panel in 1 month.   *  Hypokalemia.  6 runs IV Potassium in progress. Not tolerating this  *  Chronic constipation.  No problems with prep.    *  PAF.  On chronic Xarelto for many years.  Med on hold, last dose 9/3.     PLAN   *  Potassium 40 meq po now, repeat dose this PM. Pt should still be ok to go for EGD at 12:30 today.  Stop IV potassium.  BMET in AM.      Azucena Freed  04/27/2017, 8:54 AM Pager: [phone]   Attending physician's note   I have taken an interval history, reviewed the chart and examined the patient. I agree with the Advanced Practitioner's note, impression and recommendations.  Plan for EGD and colonoscopy for evaluation of iron deficiency anemia The risks and  benefits as well as alternatives of endoscopic procedure(s) have been discussed and reviewed. All questions answered. The patient agrees to proceed.  Damaris Hippo, MD [phone] Mon-Fri 8a-5p [phone] after 5p, weekends, holidays

## 2017-04-27 NOTE — Progress Notes (Signed)
Spoke with patient about previous experience with PO iron.  It looks like in her medication history she was previously taking ferrous sulfate- she agreed that name sounded familiar. She stated the ferrous sulfate made her extremely nauseous and caused vomiting. Also stated she has observed this when she takes a multivitamin that contains iron.  I explained there are other PO iron formulations, and talked to her specifically about ferrous fumarate as it contains the highest amount of elemental iron per pill, but is still an inexpensive OTC option. When specifically asked, she declined wanting to try another PO iron option.  I told her I would place information in her discharge summary about the oral option we discussed along with information about dietary sources of iron. That information is below.  Ferrous Fumarate: May be administered with food to prevent irritation; however, not with cereals, dietary fiber, tea, coffee, eggs, or milk. Dietary sources of iron: beans, cereal (enriched), clams, beef, lentils, liver, oysters, shrimp, and [place].  Foods that enhance dietary absorption of iron: broccoli, grapefruit, orange juice, peppers, and strawberries.  Foods that decrease dietary absorption of iron: coffee, dairy products, soy products, and tea.    Ruble Pumphrey D. Gage Treiber, PharmD, BCPS Clinical Pharmacist Pager: [phone] Clinical Phone for 04/27/2017 until 3:30pm: [phone] If after 3:30pm, please call main pharmacy at [phone] 04/27/2017 11:45 AM

## 2017-04-28 ENCOUNTER — Other Ambulatory Visit: Payer: Self-pay

## 2017-04-28 ENCOUNTER — Encounter (HOSPITAL_COMMUNITY): Payer: Self-pay | Admitting: Physician Assistant

## 2017-04-28 DIAGNOSIS — K5909 Other constipation: Secondary | ICD-10-CM

## 2017-04-28 DIAGNOSIS — R195 Other fecal abnormalities: Secondary | ICD-10-CM

## 2017-04-28 DIAGNOSIS — I10 Essential (primary) hypertension: Secondary | ICD-10-CM

## 2017-04-28 DIAGNOSIS — D508 Other iron deficiency anemias: Secondary | ICD-10-CM

## 2017-04-28 DIAGNOSIS — I480 Paroxysmal atrial fibrillation: Secondary | ICD-10-CM

## 2017-04-28 DIAGNOSIS — N179 Acute kidney failure, unspecified: Secondary | ICD-10-CM

## 2017-04-28 LAB — CBC
HEMATOCRIT: 28.2 % — AB (ref 36.0–46.0)
HEMOGLOBIN: 7.6 g/dL — AB (ref 12.0–15.0)
MCH: 17.9 pg — AB (ref 26.0–34.0)
MCHC: 27 g/dL — ABNORMAL LOW (ref 30.0–36.0)
MCV: 66.4 fL — AB (ref 78.0–100.0)
PLATELETS: 495 10*3/uL — AB (ref 150–400)
RBC: 4.25 MIL/uL (ref 3.87–5.11)
RDW: 28.4 % — ABNORMAL HIGH (ref 11.5–15.5)
WBC: 8.6 10*3/uL (ref 4.0–10.5)

## 2017-04-28 LAB — BASIC METABOLIC PANEL
ANION GAP: 6 (ref 5–15)
CHLORIDE: 115 mmol/L — AB (ref 101–111)
CO2: 20 mmol/L — ABNORMAL LOW (ref 22–32)
Calcium: 8.4 mg/dL — ABNORMAL LOW (ref 8.9–10.3)
Creatinine, Ser: 1.17 mg/dL — ABNORMAL HIGH (ref 0.44–1.00)
GFR calc Af Amer: 55 mL/min — ABNORMAL LOW (ref >60)
GFR, EST NON AFRICAN AMERICAN: 47 mL/min — AB (ref >60)
GLUCOSE: 84 mg/dL (ref 65–99)
POTASSIUM: 3.4 mmol/L — AB (ref 3.5–5.1)
Sodium: 141 mmol/L (ref 135–145)

## 2017-04-28 LAB — HAPTOGLOBIN: HAPTOGLOBIN: 207 mg/dL — AB (ref 34–200)

## 2017-04-28 MED ORDER — FLUCONAZOLE 100 MG PO TABS: 100.0000 mg | ORAL_TABLET | Freq: Every day | ORAL | 0 refills | Status: DC

## 2017-04-28 MED ORDER — POTASSIUM CHLORIDE CRYS ER 20 MEQ PO TBCR
40.0000 meq | EXTENDED_RELEASE_TABLET | ORAL | Status: AC
  Administered 2017-04-28 (×2): 40 meq via ORAL
  Filled 2017-04-28 (×2): qty 2

## 2017-04-28 NOTE — Progress Notes (Signed)
Daily Rounding Note  04/28/2017, 9:32 AM  LOS: 2 days   SUBJECTIVE:   Chief complaint: still a bit weak, overall better.  No BM or bleeding since procedures      OBJECTIVE:         Vital signs in last 24 hours:    Temp:  [97.8 F (36.6 C)-98.5 F (36.9 C)] 98.2 F (36.8 C) (09/07 0730) Pulse Rate:  [63-74] 74 (09/07 0730) Resp:  [15-17] 16 (09/07 0730) BP: (96-142)/(40-63) 105/62 (09/07 0730) SpO2:  [94 %-100 %] 100 % (09/07 0730) Weight:  [51.3 kg (113 lb)] 51.3 kg (113 lb) (09/06 2034) Last BM Date: 04/27/17 Filed Weights   04/26/17 0426 04/26/17 2055 04/27/17 2034  Weight: 51.7 kg (114 lb) 50.9 kg (112 lb 4.8 oz) 51.3 kg (113 lb)   General: anxious,  Somewhat frail looking.  comfortable   Heart: RRR Chest: clear bil.   Abdomen: soft, NT, ND  Extremities: no CCE Neuro/Psych:  Oriented x 3.  No tremor.  No gross deficits  Intake/Output from previous day: 09/06 0701 - 09/07 0700 In: 990 [P.O.:240; I.V.:750] Out: 1300 [Urine:1300]  Intake/Output this shift: No intake/output data recorded.  Lab Results:  Recent Labs  04/27/17 0837 04/27/17 1817 04/28/17 0414  WBC 8.1 12.2* 8.6  HGB 7.9* 8.8* 7.6*  HCT 28.6* 31.8* 28.2*  PLT 507* 554* 495*   BMET  Recent Labs  04/26/17 1653 04/27/17 0447 04/28/17 0414  NA 137 140 141  K 3.8 3.0* 3.4*  CL 113* 114* 115*  CO2 15* 17* 20*  GLUCOSE 97 76 84  BUN 9 9 <5*  CREATININE 1.08* 1.15* 1.17*  CALCIUM 8.7* 8.2* 8.4*   LFT  Recent Labs  04/25/17 2347  PROT 7.0  ALBUMIN 3.9  AST 19  ALT 15  ALKPHOS 98  BILITOT 0.5   PT/INR  Recent Labs  04/26/17 0455  LABPROT 14.6  INR 1.15   Hepatitis Panel No results for input(s): HEPBSAG, HCVAB, HEPAIGM, HEPBIGM in the last 72 hours.  Studies/Results: No results found.  ASSESMENT:   *  Iron def anemia.  FOBT + S/p 2 U PRBCs.  Ferric gluconate infusion x 2 of 3.  Hgb 5 >> 13.6 >> 7.9 >>  8.8 >>  7.6.   EGD 9/6: moderately severe monilial/candidial esophagitis.  Small HH.    Colonoscopy 9/6: melanosis coli, lipomatous IC valve, 10 mm transverse polyp, left sided tics, non-bleeding internal hemorrhoids.  Diflucan x 10 days, day 2.     *  Chronic Xarelto for PAF.  OK to restart.      PLAN   *  GI signing off  Discharge Planning Diet: Heart healthy  Anticoagulation and antiplatelets:Xarelto, ok to resume Discharge Medications: Prilosec/Omeprozole 40 mg daily.  Diflucan 100 mg daily for total of 10 days, today is day 2/10 Follow up: Patient is set up for capsule endoscopy on Friday 9/14. She is set up to see GI office PA lemon on 9/25. Details of appointments and her into provider Navigator.  Plans discussed with patient and her husband, instruction sheets for capsule study preparation provided and discussed with patient and husband. Patient should not take oral iron prior to the capsule study. Therefore I would not order this for her, besides which she doesn't tolerate oral iron.    Procedure Procedures planned and timing of procedure:  See EGD/Colonoscopy done 9/6.  Plan outpt capsule study.   Lab work ordered: will  need repeat CBC in 7 to 10 days at [facility] office lab.   Hold the following anticoagulation and/or antiplatelets for procedure:  Not necessary to hold Xarelto prior to the capsule Endo   Contact #: office  [phone], Dr Lynne Leader office.          Kaitlyn Adams  04/28/2017, 9:32 AM Pager: [phone]   Attending physician's note   I have taken an interval history, reviewed the chart and examined the patient. I agree with the Advanced Practitioner's note, impression and recommendations.  Ok to restart Xarelto Continue PPI daily Continue Fluconazole for 10 days Will plan for outpatient follow up and capsule endoscopy to exclude small bowel lesion May need IV infusion as outpatient to build iron stores, patient said she didn't tolerate oral Iron in the past  K Denzil Magnuson, MD [phone] Mon-Fri 8a-5p [phone] after 5p, weekends, holidays

## 2017-04-28 NOTE — Progress Notes (Signed)
mb ref gastro

## 2017-04-28 NOTE — Discharge Instructions (Addendum)
Ferrous Fumarate- May be administered with food to prevent irritation; however, not with cereals, dietary fiber, tea, coffee, eggs, or milk.  Dietary sources of iron: beans, cereal (enriched), clams, beef, lentils, liver, oysters, shrimp, and [place].  Foods that enhance dietary absorption of iron: broccoli, grapefruit, orange juice, peppers, and strawberries.  Foods that decrease dietary absorption of iron: coffee, dairy products, soy products, and tea.   Additional discharge instructions:  Please get your medications reviewed and adjusted by your Primary MD.  Please request your Primary MD to go over all Hospital Tests and Procedure/Radiological results at the follow up, please get all Hospital records sent to your Prim MD by signing hospital release before you go home.  If you had Pneumonia of Lung problems at the Hospital: Please get a 2 view Chest X ray done in 6-8 weeks after hospital discharge or sooner if instructed by your Primary MD.  If you have Congestive Heart Failure: Please call your Cardiologist or Primary MD anytime you have any of the following symptoms:  1) 3 pound weight gain in 24 hours or 5 pounds in 1 week  2) shortness of breath, with or without a dry hacking cough  3) swelling in the hands, feet or stomach  4) if you have to sleep on extra pillows at night in order to breathe  Follow cardiac low salt diet and 1.5 lit/day fluid restriction.  If you have diabetes Accuchecks 4 times/day, Once in AM empty stomach and then before each meal. Log in all results and show them to your primary doctor at your next visit. If any glucose reading is under 80 or above 300 call your primary MD immediately.  If you have Seizure/Convulsions/Epilepsy: Please do not drive, operate heavy machinery, participate in activities at heights or participate in high speed sports until you have seen by Primary MD or a Neurologist and advised to do so again.  If you had Gastrointestinal  Bleeding: Please ask your Primary MD to check a complete blood count within one week of discharge or at your next visit. Your endoscopic/colonoscopic biopsies that are pending at the time of discharge, will also need to followed by your Primary MD.  Get Medicines reviewed and adjusted. Please take all your medications with you for your next visit with your Primary MD  Please request your Primary MD to go over all hospital tests and procedure/radiological results at the follow up, please ask your Primary MD to get all Hospital records sent to his/her office.  If you experience worsening of your admission symptoms, develop shortness of breath, life threatening emergency, suicidal or homicidal thoughts you must seek medical attention immediately by calling 911 or calling your MD immediately  if symptoms less severe.  You must read complete instructions/literature along with all the possible adverse reactions/side effects for all the Medicines you take and that have been prescribed to you. Take any new Medicines after you have completely understood and accpet all the possible adverse reactions/side effects.   Do not drive or operate heavy machinery when taking Pain medications.   Do not take more than prescribed Pain, Sleep and Anxiety Medications  Special Instructions: If you have smoked or chewed Tobacco  in the last 2 yrs please stop smoking, stop any regular Alcohol  and or any Recreational drug use.  Wear Seat belts while driving.  Please note You were cared for by a hospitalist during your hospital stay. If you have any questions about your discharge medications or the care  you received while you were in the hospital after you are discharged, you can call the unit and asked to speak with the hospitalist on call if the hospitalist that took care of you is not available. Once you are discharged, your primary care physician will handle any further medical issues. Please note that NO REFILLS for  any discharge medications will be authorized once you are discharged, as it is imperative that you return to your primary care physician (or establish a relationship with a primary care physician if you do not have one) for your aftercare needs so that they can reassess your need for medications and monitor your lab values.  You can reach the hospitalist office at phone [phone] or fax [phone]   If you do not have a primary care physician, you can call [phone] for a physician referral.

## 2017-04-28 NOTE — Progress Notes (Signed)
Pt. BP 89/52 Pulse 84. MD notified. MD stated to hold Digoxin and Metoprolol and recheck BP in 30 minutes and stated if BP is over 90/59 to give BP meds. Rechecked 30 minutes later and BP was 100/59. BP meds given. Will continue to monitor.

## 2017-04-28 NOTE — Consult Note (Signed)
           [facility] CM Primary Care Navigator  04/28/2017  Kaitlyn Adams 30-Jul-1951 [id number]   Went to see patientat the bedsideto identify possible discharge needs but she was alreadydischarged per staff report.  Patient was discharged home today.  Patient noted with a discharge instruction to follow-up with primary care provider in  5 days with repeat labs (CBC & BMP).   For questions, please contact:  Dannielle Huh, BSN, RN- [facility] Primary Care Navigator  Telephone: [phone] [place]
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
## 2017-05-01 ENCOUNTER — Telehealth: Payer: Self-pay

## 2017-05-01 NOTE — Telephone Encounter (Signed)
Attempted to reach patient to complete TCM and confirm hosp f/u appt, no answer.

## 2017-05-03 ENCOUNTER — Encounter: Payer: Self-pay | Admitting: Physician Assistant

## 2017-05-03 ENCOUNTER — Ambulatory Visit (INDEPENDENT_AMBULATORY_CARE_PROVIDER_SITE_OTHER): Payer: Medicare Other | Admitting: Physician Assistant

## 2017-05-03 VITALS — BP 130/72 | HR 75 | Temp 97.9°F | Resp 14 | Ht 64.0 in | Wt 106.0 lb

## 2017-05-03 DIAGNOSIS — T148XXA Other injury of unspecified body region, initial encounter: Secondary | ICD-10-CM | POA: Diagnosis not present

## 2017-05-03 DIAGNOSIS — N179 Acute kidney failure, unspecified: Secondary | ICD-10-CM | POA: Diagnosis not present

## 2017-05-03 DIAGNOSIS — D500 Iron deficiency anemia secondary to blood loss (chronic): Secondary | ICD-10-CM | POA: Diagnosis not present

## 2017-05-03 LAB — CBC WITH DIFFERENTIAL/PLATELET
Basophils Absolute: 0.1 10*3/uL (ref 0.0–0.1)
Basophils Relative: 1 % (ref 0.0–3.0)
EOS ABS: 0.1 10*3/uL (ref 0.0–0.7)
EOS PCT: 1.1 % (ref 0.0–5.0)
HCT: 36 % (ref 36.0–46.0)
Hemoglobin: 10.8 g/dL — ABNORMAL LOW (ref 12.0–15.0)
LYMPHS ABS: 1.9 10*3/uL (ref 0.7–4.0)
Lymphocytes Relative: 18.6 % (ref 12.0–46.0)
MCHC: 29.9 g/dL — AB (ref 30.0–36.0)
MCV: 67.7 fl — ABNORMAL LOW (ref 78.0–100.0)
MONO ABS: 0.8 10*3/uL (ref 0.1–1.0)
Monocytes Relative: 7.9 % (ref 3.0–12.0)
NEUTROS PCT: 71.4 % (ref 43.0–77.0)
Neutro Abs: 7.2 10*3/uL (ref 1.4–7.7)
Platelets: 532 10*3/uL — ABNORMAL HIGH (ref 150.0–400.0)
RBC: 5.32 Mil/uL — AB (ref 3.87–5.11)
RDW: 38 % — ABNORMAL HIGH (ref 11.5–15.5)
WBC: 10 10*3/uL (ref 4.0–10.5)

## 2017-05-03 LAB — BASIC METABOLIC PANEL
BUN: 9 mg/dL (ref 6–23)
CALCIUM: 10 mg/dL (ref 8.4–10.5)
CO2: 24 meq/L (ref 19–32)
Chloride: 106 mEq/L (ref 96–112)
Creatinine, Ser: 1.15 mg/dL (ref 0.40–1.20)
GFR: 49.93 mL/min — AB (ref >60.00)
Glucose, Bld: 97 mg/dL (ref 70–99)
POTASSIUM: 3.4 meq/L — AB (ref 3.5–5.1)
SODIUM: 141 meq/L (ref 135–145)

## 2017-05-03 NOTE — Progress Notes (Signed)
Pre visit review using our clinic review tool, if applicable. No additional management support is needed unless otherwise documented below in the visit note. 

## 2017-05-03 NOTE — Progress Notes (Signed)
Patient presents to clinic today for TCM follow-up. Patient was sent to the ER on 04/26/2017 by this provider due to hemoglobin at 5.0 and patient experiencing SOBOE and fatigue. Patient was transfused 2 units pRBCs in the ER prior to admission. Assessment in hospital included labs revealing mild AKI, EGD revealing significant candidal esophagitis, colonoscopy revealing diverticulosis and a lipomatous ileocecal valve requiring biopsy.  No noted source of bleeding. Labs stabilized in hospital with resolution of AKI. Patient discharged on PPI, Xarelto and home medications. No iron supplements started due to affect they would have on capsule endoscopy. Capsule endoscopy scheduled for 05/05/17 and GI follow-up in 05/16/17.  Since discharge, patient endorses feeling better overall. Still having some fatigue but notes continued lightheadedness with standing quickly. Denies SOB, chest pain or palpitations. Has capsule endoscopy scheduled for 2 days from now. Has resumed Xarelto as directed by GI. Denies any melena or hematochezia. Of note, patient does note some bumps underneath her arm near IV site that are tender to the touch. Denies arm swelling.  Past Medical History:  Diagnosis Date  . Anemia   . Asthma    - normal spirometry 2009 FEV1  >90% predicted.  - methacoline challenge neg 11/09  . Bronchitis    recurrent  . Cataract   . Chronic cough    - sinus CT 03-23-10 > neg.  - allergy profile 03-23-10 > nl, IgE 14.  - flutter valve rx 03-23-10  . Fibromyalgia   . GERD (gastroesophageal reflux disease)    exacerbating VCD  . Glaucoma   . Hyperlipidemia   . Hypertension   . Hyperthyroidism    with hot nodule.  - total thyroidectomy 11-05-08 benign -> subsequent hypothyroidism.  Marland Kitchen Hypokalemia   . Hypothyroidism   . Hypothyroidism    a. following thyroidectomy.  . Mild CAD    a. LHC 04/2015 - mild nonobstructive CAD with 30% dLAD, 40% D1, 25% pLCx, 30% mRCA, 30% RPDA, normal EF >65% with normal LVEDP.  .  Orthostatic hypotension   . Osteoarthritis   . Paroxysmal atrial fibrillation (HCC)   . Renal insufficiency   . Vasomotor rhinitis    exacerbates VCD  . Vocal cord dysfunction    proven on FOB 9/09    Current Outpatient Prescriptions on File Prior to Visit  Medication Sig Dispense Refill  . Acetaminophen (TYLENOL ARTHRITIS PAIN PO) Take 325 mg by mouth daily as needed (FOR MILD PAIN).     Marland Kitchen albuterol (PROVENTIL HFA;VENTOLIN HFA) 108 (90 Base) MCG/ACT inhaler Inhale 2 puffs into the lungs every 6 (six) hours as needed for wheezing or shortness of breath. 1 Inhaler 2  . ALPRAZolam (XANAX) 0.5 MG tablet Take 1 tablet (0.5 mg total) by mouth 2 (two) times daily as needed for anxiety. 60 tablet 3  . amitriptyline (ELAVIL) 50 MG tablet TAKE 1 TABLET(50 MG) BY MOUTH AT BEDTIME 90 tablet 0  . atorvastatin (LIPITOR) 20 MG tablet TAKE 1 TABLET(20 MG) BY MOUTH DAILY 90 tablet 0  . DIGOX 125 MCG tablet TAKE 1 TABLET(0.125 MG) BY MOUTH DAILY 90 tablet 0  . fluconazole (DIFLUCAN) 100 MG tablet Take 1 tablet (100 mg total) by mouth daily. 8 tablet 0  . ketotifen (ZADITOR) 0.025 % ophthalmic solution Place 1 drop into both eyes 2 (two) times daily.    Marland Kitchen levothyroxine (SYNTHROID, LEVOTHROID) 75 MCG tablet Take 1 tablet (75 mcg total) by mouth daily. 90 tablet 1  . metoprolol succinate (TOPROL-XL) 25 MG 24 hr tablet  TAKE 1 TABLET(25 MG) BY MOUTH DAILY 90 tablet 0  . omeprazole (PRILOSEC) 40 MG capsule TAKE 1 CAPSULE(40 MG) BY MOUTH DAILY 90 capsule 0  . Rivaroxaban (XARELTO) 15 MG TABS tablet Take 1 tablet (15 mg total) by mouth daily with supper. 90 tablet 1  . spironolactone (ALDACTONE) 25 MG tablet TAKE 1/2 TABLET(12.5 MG) BY MOUTH DAILY 45 tablet 0  . Vitamin D, Ergocalciferol, (DRISDOL) 50000 units CAPS capsule Take 50,000 Units by mouth every 7 (seven) days.     No current facility-administered medications on file prior to visit.     Allergies  Allergen Reactions  . Codeine Itching    REACTION:  itching  . Erythromycin Nausea And Vomiting    REACTION: nausea and vomiting  . Guaifenesin Er Palpitations  . Hydrocod Polst-Cpm Polst Er Other (See Comments)    Unknown reaction  . Sulfonamide Derivatives Nausea And Vomiting    REACTION: nausea and vomiting  . Tramadol Hcl Itching    REACTION: itching    Family History  Problem Relation Age of Onset  . Asthma Mother   . Heart disease Mother   . Emphysema Father   . Heart disease Father   . Asthma Grandchild     Social History   Social History  . Marital status: Married    Spouse name: N/A  . Number of children: 1  . Years of education: N/A   Occupational History  . 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 Smoking status: Never Smoker  . Smokeless tobacco: Never Used  . Alcohol use 0.6 oz/week    1 Glasses of wine per week     Comment: occasional wine or beer  . Drug use: No  . Sexual activity: Yes    Birth control/ protection: None   Other Topics Concern  . None   Social History Narrative  . None    Review of Systems - See HPI.  All other ROS are negative.  BP 130/72   Pulse 75   Temp 97.9 F (36.6 C) (Oral)   Resp 14   Ht '5\' 4"'  (1.626 m)   Wt 106 lb (48.1 kg)   SpO2 99%   BMI 18.19 kg/m   Physical Exam  Constitutional: She is oriented to person, place, and time and well-developed, well-nourished, and in no distress.  HENT:  Head: Normocephalic and atraumatic.  Eyes: Conjunctivae are normal.  Neck: Neck supple.  Cardiovascular: Normal heart sounds and intact distal pulses.   Chronic irregularly irregular rhythm of atrial fibrillation  Pulmonary/Chest: Effort normal and breath sounds normal. No respiratory distress. She has no wheezes. She has no rales. She exhibits no tenderness.  Neurological: She is alert and oriented to person, place, and time.  Skin: Skin is warm and dry. No rash noted.  Psychiatric: Affect normal.  Vitals  reviewed.   Recent Results (from the past 2160 hour(s))  Iron and TIBC     Status: Abnormal   Collection Time: 04/25/17  1:45 AM  Result Value Ref Range   Iron 13 (L) 28 - 170 ug/dL   TIBC 552 (H) 250 - 450 ug/dL   Saturation Ratios 2 (L) 10.4 - 31.8 %   UIBC 539 ug/dL  Ferritin (Iron Binding Protein)     Status: Abnormal   Collection Time: 04/25/17  1:45 AM  Result Value Ref Range   Ferritin 2 (L) 11 - 307 ng/mL  Haptoglobin  Status: Abnormal   Collection Time: 04/25/17  1:45 AM  Result Value Ref Range   Haptoglobin 207 (H) 34 - 200 mg/dL    Comment: (NOTE) Performed At: [facility] [place], [place] [id number] Lindon Romp MD AY:[id number]   CBC w/Diff     Status: Abnormal   Collection Time: 04/25/17  3:17 PM  Result Value Ref Range   WBC 11.5 (H) 4.0 - 10.5 K/uL   RBC 3.46 (L) 3.87 - 5.11 Mil/uL   Hemoglobin 5.0 Repeated and verified X2. (LL) 12.0 - 15.0 g/dL   HCT 18.5 Repeated and verified X2. (LL) 36.0 - 46.0 %   MCV 53.5 (L) 78.0 - 100.0 fl   MCHC 27.0 (L) 30.0 - 36.0 g/dL   RDW 20.4 (H) 11.5 - 15.5 %   Platelets 688.0 (H) 150.0 - 400.0 K/uL   Neutrophils Relative % 73.0 43.0 - 77.0 %   Lymphocytes Relative 18.1 12.0 - 46.0 %   Monocytes Relative 7.8 3.0 - 12.0 %   Eosinophils Relative 0.4 0.0 - 5.0 %   Basophils Relative 0.7 0.0 - 3.0 %   Neutro Abs 8.3 (H) 1.4 - 7.7 K/uL   Lymphs Abs 2.1 0.7 - 4.0 K/uL   Monocytes Absolute 0.9 0.1 - 1.0 K/uL   Eosinophils Absolute 0.1 0.0 - 0.7 K/uL   Basophils Absolute 0.1 0.0 - 0.1 K/uL  Type and screen     Status: None   Collection Time: 04/25/17  5:55 PM  Result Value Ref Range   ABO/RH(D) O POS    Antibody Screen NEG    Sample Expiration 04/28/2017    Unit Number [id number]    Blood Component Type RED CELLS,LR    Unit division 00    Status of Unit ISSUED,FINAL    Transfusion Status OK TO TRANSFUSE    Crossmatch Result Compatible    Unit Number [id number]    Blood Component  Type RED CELLS,LR    Unit division 00    Status of Unit ISSUED,FINAL    Transfusion Status OK TO TRANSFUSE    Crossmatch Result Compatible   ABO/Rh     Status: None   Collection Time: 04/25/17  5:55 PM  Result Value Ref Range   ABO/RH(D) O POS   BPAM RBC     Status: None   Collection Time: 04/25/17  5:55 PM  Result Value Ref Range   ISSUE DATE / TIME [id number]    Blood Product Unit Number [id number]    PRODUCT CODE E0336V00    Unit Type and Rh 5100    Blood Product Expiration Date [id number]    ISSUE DATE / TIME 967893810175    Blood Product Unit Number [id number]    PRODUCT CODE E0336V00    Unit Type and Rh 5100    Blood Product Expiration Date [id number]   CBC with Differential/Platelet     Status: Abnormal   Collection Time: 04/25/17 11:47 PM  Result Value Ref Range   WBC 12.0 (H) 4.0 - 10.5 K/uL   RBC 3.71 (L) 3.87 - 5.11 MIL/uL   Hemoglobin 5.3 (LL) 12.0 - 15.0 g/dL    Comment: REPEATED TO VERIFY CRITICAL RESULT CALLED TO, READ BACK BY AND VERIFIED WITH: C PRUETT,RN 0026 04/26/17 D BRADLEY    HCT 20.8 (L) 36.0 - 46.0 %   MCV 56.1 (L) 78.0 - 100.0 fL   MCH 14.3 (L) 26.0 - 34.0 pg   MCHC 25.5 (L) 30.0 - 36.0 g/dL  RDW 20.5 (H) 11.5 - 15.5 %   Platelets 688 (H) 150 - 400 K/uL   Neutrophils Relative % 65 %   Lymphocytes Relative 26 %   Monocytes Relative 7 %   Eosinophils Relative 1 %   Basophils Relative 1 %   Neutro Abs 7.9 (H) 1.7 - 7.7 K/uL   Lymphs Abs 3.1 0.7 - 4.0 K/uL   Monocytes Absolute 0.8 0.1 - 1.0 K/uL   Eosinophils Absolute 0.1 0.0 - 0.7 K/uL   Basophils Absolute 0.1 0.0 - 0.1 K/uL   RBC Morphology ELLIPTOCYTES     Comment: SPHEROCYTES POLYCHROMASIA PRESENT   Comprehensive metabolic panel     Status: Abnormal   Collection Time: 04/25/17 11:47 PM  Result Value Ref Range   Sodium 136 135 - 145 mmol/L   Potassium 3.0 (L) 3.5 - 5.1 mmol/L   Chloride 108 101 - 111 mmol/L   CO2 20 (L) 22 - 32 mmol/L   Glucose, Bld 99 65 - 99 mg/dL   BUN  14 6 - 20 mg/dL   Creatinine, Ser 1.31 (H) 0.44 - 1.00 mg/dL   Calcium 9.4 8.9 - 10.3 mg/dL   Total Protein 7.0 6.5 - 8.1 g/dL   Albumin 3.9 3.5 - 5.0 g/dL   AST 19 15 - 41 U/L   ALT 15 14 - 54 U/L   Alkaline Phosphatase 98 38 - 126 U/L   Total Bilirubin 0.5 0.3 - 1.2 mg/dL   GFR calc non Af Amer 41 (L) >60 mL/min   GFR calc Af Amer 48 (L) >60 mL/min    Comment: (NOTE) The eGFR has been calculated using the CKD EPI equation. This calculation has not been validated in all clinical situations. eGFR's persistently <60 mL/min signify possible Chronic Kidney Disease.    Anion gap 8 5 - 15  Troponin I     Status: None   Collection Time: 04/25/17 11:47 PM  Result Value Ref Range   Troponin I <0.03 <0.03 ng/mL  Prepare RBC     Status: None   Collection Time: 04/26/17 12:30 AM  Result Value Ref Range   Order Confirmation ORDER PROCESSED BY BLOOD BANK   POC occult blood, ED     Status: Abnormal   Collection Time: 04/26/17  2:31 AM  Result Value Ref Range   Fecal Occult Bld POSITIVE (A) NEGATIVE  Lactate dehydrogenase     Status: None   Collection Time: 04/26/17  2:36 AM  Result Value Ref Range   LDH 165 98 - 192 U/L  Protime-INR     Status: None   Collection Time: 04/26/17  4:55 AM  Result Value Ref Range   Prothrombin Time 14.6 11.4 - 15.2 seconds   INR 1.15   Magnesium     Status: None   Collection Time: 04/26/17  4:55 AM  Result Value Ref Range   Magnesium 2.1 1.7 - 2.4 mg/dL  Digoxin level     Status: Abnormal   Collection Time: 04/26/17  4:55 AM  Result Value Ref Range   Digoxin Level 0.6 (L) 0.8 - 2.0 ng/mL  CBC     Status: Abnormal   Collection Time: 04/26/17  4:55 AM  Result Value Ref Range   WBC 8.1 4.0 - 10.5 K/uL   RBC 4.15 3.87 - 5.11 MIL/uL   Hemoglobin 6.8 (LL) 12.0 - 15.0 g/dL    Comment: DELTA CHECK NOTED REPEATED TO VERIFY POST TRANSFUSION SPECIMEN CRITICAL VALUE NOTED.  VALUE IS CONSISTENT WITH PREVIOUSLY REPORTED  AND CALLED VALUE.    HCT 25.2 (L)  36.0 - 46.0 %   MCV 60.7 (L) 78.0 - 100.0 fL    Comment: DELTA CHECK NOTED REPEATED TO VERIFY POST TRANSFUSION SPECIMEN    MCH 16.4 (L) 26.0 - 34.0 pg   MCHC 27.0 (L) 30.0 - 36.0 g/dL   RDW 25.3 (H) 11.5 - 15.5 %   Platelets 575 (H) 150 - 400 K/uL  Basic metabolic panel     Status: Abnormal   Collection Time: 04/26/17  4:55 AM  Result Value Ref Range   Sodium 135 135 - 145 mmol/L   Potassium 2.9 (L) 3.5 - 5.1 mmol/L   Chloride 107 101 - 111 mmol/L   CO2 19 (L) 22 - 32 mmol/L   Glucose, Bld 95 65 - 99 mg/dL   BUN 11 6 - 20 mg/dL   Creatinine, Ser 1.22 (H) 0.44 - 1.00 mg/dL   Calcium 8.9 8.9 - 10.3 mg/dL   GFR calc non Af Amer 45 (L) >60 mL/min   GFR calc Af Amer 52 (L) >60 mL/min    Comment: (NOTE) The eGFR has been calculated using the CKD EPI equation. This calculation has not been validated in all clinical situations. eGFR's persistently <60 mL/min signify possible Chronic Kidney Disease.    Anion gap 9 5 - 15  APTT     Status: None   Collection Time: 04/26/17  4:55 AM  Result Value Ref Range   aPTT 31 24 - 36 seconds  Glucose, capillary     Status: None   Collection Time: 04/26/17  7:50 AM  Result Value Ref Range   Glucose-Capillary 82 65 - 99 mg/dL  CBC     Status: Abnormal   Collection Time: 04/26/17 10:37 AM  Result Value Ref Range   WBC 8.6 4.0 - 10.5 K/uL   RBC 4.67 3.87 - 5.11 MIL/uL   Hemoglobin 8.6 (L) 12.0 - 15.0 g/dL    Comment: REPEATED TO VERIFY POST TRANSFUSION SPECIMEN    HCT 30.1 (L) 36.0 - 46.0 %   MCV 64.5 (L) 78.0 - 100.0 fL   MCH 18.4 (L) 26.0 - 34.0 pg   MCHC 28.6 (L) 30.0 - 36.0 g/dL   RDW 27.7 (H) 11.5 - 15.5 %   Platelets 555 (H) 150 - 400 K/uL    Comment: PLATELET COUNT CONFIRMED BY SMEAR  Basic metabolic panel     Status: Abnormal   Collection Time: 04/26/17  4:53 PM  Result Value Ref Range   Sodium 137 135 - 145 mmol/L   Potassium 3.8 3.5 - 5.1 mmol/L    Comment: DELTA CHECK NOTED   Chloride 113 (H) 101 - 111 mmol/L   CO2 15  (L) 22 - 32 mmol/L   Glucose, Bld 97 65 - 99 mg/dL   BUN 9 6 - 20 mg/dL   Creatinine, Ser 1.08 (H) 0.44 - 1.00 mg/dL   Calcium 8.7 (L) 8.9 - 10.3 mg/dL   GFR calc non Af Amer 52 (L) >60 mL/min   GFR calc Af Amer >60 >60 mL/min    Comment: (NOTE) The eGFR has been calculated using the CKD EPI equation. This calculation has not been validated in all clinical situations. eGFR's persistently <60 mL/min signify possible Chronic Kidney Disease.    Anion gap 9 5 - 15  CBC     Status: Abnormal   Collection Time: 04/26/17  4:53 PM  Result Value Ref Range   WBC 18.9 (H) 4.0 - 10.5  K/uL    Comment: QUESTIONABLE RESULTS, RECOMMEND RECOLLECT TO VERIFY   RBC 7.16 (H) 3.87 - 5.11 MIL/uL    Comment: QUESTIONABLE RESULTS, RECOMMEND RECOLLECT TO VERIFY   Hemoglobin 13.6 12.0 - 15.0 g/dL    Comment: QUESTIONABLE RESULTS, RECOMMEND RECOLLECT TO VERIFY   HCT 46.2 (H) 36.0 - 46.0 %    Comment: QUESTIONABLE RESULTS, RECOMMEND RECOLLECT TO VERIFY   MCV 64.5 (L) 78.0 - 100.0 fL    Comment: QUESTIONABLE RESULTS, RECOMMEND RECOLLECT TO VERIFY   MCH 19.0 (L) 26.0 - 34.0 pg    Comment: QUESTIONABLE RESULTS, RECOMMEND RECOLLECT TO VERIFY   MCHC 29.4 (L) 30.0 - 36.0 g/dL    Comment: QUESTIONABLE RESULTS, RECOMMEND RECOLLECT TO VERIFY   RDW 28.2 (H) 11.5 - 15.5 %    Comment: QUESTIONABLE RESULTS, RECOMMEND RECOLLECT TO VERIFY   Platelets  150 - 400 K/uL    QUESTIONABLE RESULTS, RECOMMEND RECOLLECT TO VERIFY  CBC     Status: Abnormal   Collection Time: 04/26/17  6:46 PM  Result Value Ref Range   WBC 19.1 (H) 4.0 - 10.5 K/uL   RBC 6.45 (H) 3.87 - 5.11 MIL/uL    Comment: MIXED RBC POPULATION   Hemoglobin 12.2 12.0 - 15.0 g/dL    Comment: RESULTS VERIFIED VIA RECOLLECT   HCT 42.4 36.0 - 46.0 %   MCV 65.7 (L) 78.0 - 100.0 fL   MCH 18.9 (L) 26.0 - 34.0 pg   MCHC 28.8 (L) 30.0 - 36.0 g/dL   RDW 28.1 (H) 11.5 - 15.5 %   Platelets  150 - 400 K/uL    PLATELET CLUMPS NOTED ON SMEAR, COUNT APPEARS INCREASED   CBC     Status: Abnormal   Collection Time: 04/27/17 12:31 AM  Result Value Ref Range   WBC 17.4 (H) 4.0 - 10.5 K/uL   RBC 4.99 3.87 - 5.11 MIL/uL   Hemoglobin 9.2 (L) 12.0 - 15.0 g/dL    Comment: REPEATED TO VERIFY SPECIMEN CHECKED FOR CLOTS DELTA CHECK NOTED    HCT 32.3 (L) 36.0 - 46.0 %   MCV 64.7 (L) 78.0 - 100.0 fL   MCH 18.4 (L) 26.0 - 34.0 pg   MCHC 28.5 (L) 30.0 - 36.0 g/dL   RDW 27.8 (H) 11.5 - 15.5 %   Platelets 605 (H) 150 - 400 K/uL  Basic metabolic panel     Status: Abnormal   Collection Time: 04/27/17  4:47 AM  Result Value Ref Range   Sodium 140 135 - 145 mmol/L   Potassium 3.0 (L) 3.5 - 5.1 mmol/L   Chloride 114 (H) 101 - 111 mmol/L   CO2 17 (L) 22 - 32 mmol/L   Glucose, Bld 76 65 - 99 mg/dL   BUN 9 6 - 20 mg/dL   Creatinine, Ser 1.15 (H) 0.44 - 1.00 mg/dL   Calcium 8.2 (L) 8.9 - 10.3 mg/dL   GFR calc non Af Amer 48 (L) >60 mL/min   GFR calc Af Amer 56 (L) >60 mL/min    Comment: (NOTE) The eGFR has been calculated using the CKD EPI equation. This calculation has not been validated in all clinical situations. eGFR's persistently <60 mL/min signify possible Chronic Kidney Disease.    Anion gap 9 5 - 15  CBC     Status: Abnormal   Collection Time: 04/27/17  8:37 AM  Result Value Ref Range   WBC 8.1 4.0 - 10.5 K/uL   RBC 4.34 3.87 - 5.11 MIL/uL   Hemoglobin 7.9 (  L) 12.0 - 15.0 g/dL   HCT 28.6 (L) 36.0 - 46.0 %   MCV 65.9 (L) 78.0 - 100.0 fL   MCH 18.2 (L) 26.0 - 34.0 pg   MCHC 27.6 (L) 30.0 - 36.0 g/dL   RDW 27.6 (H) 11.5 - 15.5 %   Platelets 507 (H) 150 - 400 K/uL  CBC     Status: Abnormal   Collection Time: 04/27/17  6:17 PM  Result Value Ref Range   WBC 12.2 (H) 4.0 - 10.5 K/uL   RBC 4.82 3.87 - 5.11 MIL/uL   Hemoglobin 8.8 (L) 12.0 - 15.0 g/dL   HCT 31.8 (L) 36.0 - 46.0 %   MCV 66.0 (L) 78.0 - 100.0 fL   MCH 18.3 (L) 26.0 - 34.0 pg   MCHC 27.7 (L) 30.0 - 36.0 g/dL   RDW 27.9 (H) 11.5 - 15.5 %   Platelets 554 (H) 150 - 400 K/uL  CBC      Status: Abnormal   Collection Time: 04/28/17  4:14 AM  Result Value Ref Range   WBC 8.6 4.0 - 10.5 K/uL   RBC 4.25 3.87 - 5.11 MIL/uL   Hemoglobin 7.6 (L) 12.0 - 15.0 g/dL   HCT 28.2 (L) 36.0 - 46.0 %   MCV 66.4 (L) 78.0 - 100.0 fL   MCH 17.9 (L) 26.0 - 34.0 pg   MCHC 27.0 (L) 30.0 - 36.0 g/dL   RDW 28.4 (H) 11.5 - 15.5 %   Platelets 495 (H) 150 - 400 K/uL  Basic metabolic panel     Status: Abnormal   Collection Time: 04/28/17  4:14 AM  Result Value Ref Range   Sodium 141 135 - 145 mmol/L   Potassium 3.4 (L) 3.5 - 5.1 mmol/L   Chloride 115 (H) 101 - 111 mmol/L   CO2 20 (L) 22 - 32 mmol/L   Glucose, Bld 84 65 - 99 mg/dL   BUN <5 (L) 6 - 20 mg/dL   Creatinine, Ser 1.17 (H) 0.44 - 1.00 mg/dL   Calcium 8.4 (L) 8.9 - 10.3 mg/dL   GFR calc non Af Amer 47 (L) >60 mL/min   GFR calc Af Amer 55 (L) >60 mL/min    Comment: (NOTE) The eGFR has been calculated using the CKD EPI equation. This calculation has not been validated in all clinical situations. eGFR's persistently <60 mL/min signify possible Chronic Kidney Disease.    Anion gap 6 5 - 15  CBC w/Diff     Status: Abnormal   Collection Time: 05/03/17 11:49 AM  Result Value Ref Range   WBC 10.0 4.0 - 10.5 K/uL   RBC 5.32 (H) 3.87 - 5.11 Mil/uL   Hemoglobin 10.8 (L) 12.0 - 15.0 g/dL   HCT 36.0 36.0 - 46.0 %   MCV 67.7 Repeated and verified X2. (L) 78.0 - 100.0 fl   MCHC 29.9 (L) 30.0 - 36.0 g/dL   RDW 38.0 (H) 11.5 - 15.5 %   Platelets 532.0 (H) 150.0 - 400.0 K/uL   Neutrophils Relative % 71.4 43.0 - 77.0 %   Lymphocytes Relative 18.6 12.0 - 46.0 %   Monocytes Relative 7.9 3.0 - 12.0 %   Eosinophils Relative 1.1 0.0 - 5.0 %   Basophils Relative 1.0 0.0 - 3.0 %   Neutro Abs 7.2 1.4 - 7.7 K/uL   Lymphs Abs 1.9 0.7 - 4.0 K/uL   Monocytes Absolute 0.8 0.1 - 1.0 K/uL   Eosinophils Absolute 0.1 0.0 - 0.7 K/uL   Basophils  Absolute 0.1 0.0 - 0.1 K/uL  Basic metabolic panel     Status: Abnormal   Collection Time: 05/03/17 11:49 AM   Result Value Ref Range   Sodium 141 135 - 145 mEq/L   Potassium 3.4 (L) 3.5 - 5.1 mEq/L   Chloride 106 96 - 112 mEq/L   CO2 24 19 - 32 mEq/L   Glucose, Bld 97 70 - 99 mg/dL   BUN 9 6 - 23 mg/dL   Creatinine, Ser 1.15 0.40 - 1.20 mg/dL   Calcium 10.0 8.4 - 10.5 mg/dL   GFR 49.93 (L) >60.00 mL/min    Assessment/Plan: 1. Chronic blood loss anemia Unclear source. Patient scheduled for capsule endoscopy in two days. She is to attend procedure as schedule. BP improved and coloring improved from last time patient seen in office. Repeat hemoglobin and indices today to make sure anemia is improved or stable.  - CBC w/Diff  2. AKI (acute kidney injury) ([place]) Resolved by discharge. Is taking medications as directed. Hydrating well with good urinary output. BMP today.  - Basic metabolic panel  3. Hematoma Hematoma versus superficial clotting. Mild tenderness. No evidence of phlebitis. [place] today to further assess.  - US Venous Img Upper Uni Left; Future   Leeanne Rio, PA-C

## 2017-05-03 NOTE — Patient Instructions (Signed)
Please go to the lab for repeat blood work. I will call you with your results. If not improved as I would hope, will stop Xarelto until capsule study.  Keep hydrated and get plenty of rest.  If you note any worsening symptoms, please go to the ER.   Please stop by the front desk to speak with Levada Dy to schedule your [place].

## 2017-05-04 ENCOUNTER — Ambulatory Visit (HOSPITAL_BASED_OUTPATIENT_CLINIC_OR_DEPARTMENT_OTHER)
Admission: RE | Admit: 2017-05-04 | Discharge: 2017-05-04 | Disposition: A | Discharge status: Home / Self Care | Payer: Medicare Other | Source: Ambulatory Visit | Attending: Physician Assistant | Admitting: Physician Assistant

## 2017-05-04 DIAGNOSIS — I82612 Acute embolism and thrombosis of superficial veins of left upper extremity: Secondary | ICD-10-CM | POA: Insufficient documentation

## 2017-05-04 DIAGNOSIS — T148XXA Other injury of unspecified body region, initial encounter: Secondary | ICD-10-CM

## 2017-05-10 ENCOUNTER — Ambulatory Visit (INDEPENDENT_AMBULATORY_CARE_PROVIDER_SITE_OTHER): Payer: Medicare Other | Admitting: Gastroenterology

## 2017-05-10 ENCOUNTER — Encounter: Payer: Self-pay | Admitting: Gastroenterology

## 2017-05-10 DIAGNOSIS — D509 Iron deficiency anemia, unspecified: Secondary | ICD-10-CM

## 2017-05-10 NOTE — Progress Notes (Signed)
Patient here for capsule endoscopy. Tolerated procedure. Verbalizes understanding of written and verbal instructions.  Capsule ID # [id number] Lot - [id number] Exp - 2018-06-27

## 2017-05-11 ENCOUNTER — Ambulatory Visit (INDEPENDENT_AMBULATORY_CARE_PROVIDER_SITE_OTHER): Payer: Medicare Other | Admitting: Physician Assistant

## 2017-05-11 ENCOUNTER — Encounter: Payer: Self-pay | Admitting: Physician Assistant

## 2017-05-11 VITALS — BP 110/70 | HR 78 | Temp 97.8°F | Resp 14 | Ht 64.0 in | Wt 106.0 lb

## 2017-05-11 DIAGNOSIS — D500 Iron deficiency anemia secondary to blood loss (chronic): Secondary | ICD-10-CM

## 2017-05-11 LAB — CBC WITH DIFFERENTIAL/PLATELET
BASOS ABS: 0.1 10*3/uL (ref 0.0–0.1)
Basophils Relative: 1.4 % (ref 0.0–3.0)
EOS ABS: 0 10*3/uL (ref 0.0–0.7)
Eosinophils Relative: 0.7 % (ref 0.0–5.0)
HCT: 37 % (ref 36.0–46.0)
Hemoglobin: 11.4 g/dL — ABNORMAL LOW (ref 12.0–15.0)
LYMPHS ABS: 1.3 10*3/uL (ref 0.7–4.0)
LYMPHS PCT: 21.1 % (ref 12.0–46.0)
MCHC: 30.7 g/dL (ref 30.0–36.0)
MCV: 71.5 fl — ABNORMAL LOW (ref 78.0–100.0)
Monocytes Absolute: 0.5 10*3/uL (ref 0.1–1.0)
Monocytes Relative: 8.1 % (ref 3.0–12.0)
NEUTROS ABS: 4.1 10*3/uL (ref 1.4–7.7)
NEUTROS PCT: 68.7 % (ref 43.0–77.0)
PLATELETS: 378 10*3/uL (ref 150.0–400.0)
RBC: 5.17 Mil/uL — ABNORMAL HIGH (ref 3.87–5.11)
RDW: 37.6 % — ABNORMAL HIGH (ref 11.5–15.5)
WBC: 5.9 10*3/uL (ref 4.0–10.5)

## 2017-05-11 MED ORDER — PANTOPRAZOLE SODIUM 40 MG PO TBEC: 40.0000 mg | DELAYED_RELEASE_TABLET | Freq: Every day | ORAL | 3 refills | Status: DC

## 2017-05-11 NOTE — Progress Notes (Signed)
Pre visit review using our clinic review tool, if applicable. No additional management support is needed unless otherwise documented below in the visit note. 

## 2017-05-11 NOTE — Patient Instructions (Signed)
Please stay well-hydrated and get plenty of rest. (water and not just sodas!). Eat a well-balanced but blander diet to help with acid reflux.   Start the Protonix instead of the Omeprazole.  Follow-up with Gastroenterology as scheduled.  We are rechecking your blood work today. I will call you with your results. If you note any worsening symptoms, please go to the ER.    Food Choices for Gastroesophageal Reflux Disease, Adult When you have gastroesophageal reflux disease (GERD), the foods you eat and your eating habits are very important. Choosing the right foods can help ease your discomfort. What guidelines do I need to follow?  Choose fruits, vegetables, whole grains, and low-fat dairy products.  Choose low-fat meat, fish, and poultry.  Limit fats such as oils, salad dressings, butter, nuts, and avocado.  Keep a food diary. This helps you identify foods that cause symptoms.  Avoid foods that cause symptoms. These may be different for everyone.  Eat small meals often instead of 3 large meals a day.  Eat your meals slowly, in a place where you are relaxed.  Limit fried foods.  Cook foods using methods other than frying.  Avoid drinking alcohol.  Avoid drinking large amounts of liquids with your meals.  Avoid bending over or lying down until 2-3 hours after eating. What foods are not recommended? These are some foods and drinks that may make your symptoms worse: Vegetables Tomatoes. Tomato juice. Tomato and spaghetti sauce. Chili peppers. Onion and garlic. Horseradish. Fruits Oranges, grapefruit, and lemon (fruit and juice). Meats High-fat meats, fish, and poultry. This includes hot dogs, ribs, ham, sausage, salami, and bacon. Dairy Whole milk and chocolate milk. Sour cream. Cream. Butter. Ice cream. Cream cheese. Drinks Coffee and tea. Bubbly (carbonated) drinks or energy drinks. Condiments Hot sauce. Barbecue sauce. Sweets/Desserts Chocolate and cocoa. Donuts.  Peppermint and spearmint. Fats and Oils High-fat foods. This includes [place] fries and potato chips. Other Vinegar. Strong spices. This includes black pepper, white pepper, red pepper, cayenne, curry powder, cloves, ginger, and chili powder. The items listed above may not be a complete list of foods and drinks to avoid. Contact your dietitian for more information. This information is not intended to replace advice given to you by your health care provider. Make sure you discuss any questions you have with your health care provider. Document Released: 02/07/2012 Document Revised: 01/14/2016 Document Reviewed: 06/12/2013 Elsevier Interactive Patient Education  2017 [employer].

## 2017-05-11 NOTE — Progress Notes (Signed)
Patient presents to clinic today for follow-up regarding fatigue from blood loss anemia of uncertain origin. At last visit, patient was noting improvement in energy levels. Hgb had increased from 7.6 on 04/28/17 to 10.8 on 05/03/17. Has noted improvement in energy level overall. Still having occasional SOBOE and positional lightheadedness. Denies chest pain, palpitations, SOB at rest, melena, hematochezia or change to bowel or bladder habits. Patient had her capsule endoscopy yesterday. Still waiting on results.   Past Medical History:  Diagnosis Date  . Anemia   . Asthma    - normal spirometry 2009 FEV1  >90% predicted.  - methacoline challenge neg 11/09  . Bronchitis    recurrent  . Cataract   . Chronic cough    - sinus CT 03-23-10 > neg.  - allergy profile 03-23-10 > nl, IgE 14.  - flutter valve rx 03-23-10  . Fibromyalgia   . GERD (gastroesophageal reflux disease)    exacerbating VCD  . Glaucoma   . Hyperlipidemia   . Hypertension   . Hyperthyroidism    with hot nodule.  - total thyroidectomy 11-05-08 benign -> subsequent hypothyroidism.  Marland Kitchen Hypokalemia   . Hypothyroidism   . Hypothyroidism    a. following thyroidectomy.  . Mild CAD    a. LHC 04/2015 - mild nonobstructive CAD with 30% dLAD, 40% D1, 25% pLCx, 30% mRCA, 30% RPDA, normal EF >65% with normal LVEDP.  . Orthostatic hypotension   . Osteoarthritis   . Paroxysmal atrial fibrillation (HCC)   . Renal insufficiency   . Vasomotor rhinitis    exacerbates VCD  . Vocal cord dysfunction    proven on FOB 9/09    Current Outpatient Prescriptions on File Prior to Visit  Medication Sig Dispense Refill  . Acetaminophen (TYLENOL ARTHRITIS PAIN PO) Take 325 mg by mouth daily as needed (FOR MILD PAIN).     Marland Kitchen albuterol (PROVENTIL HFA;VENTOLIN HFA) 108 (90 Base) MCG/ACT inhaler Inhale 2 puffs into the lungs every 6 (six) hours as needed for wheezing or shortness of breath. 1 Inhaler 2  . ALPRAZolam (XANAX) 0.5 MG tablet Take 1 tablet (0.5  mg total) by mouth 2 (two) times daily as needed for anxiety. 60 tablet 3  . amitriptyline (ELAVIL) 50 MG tablet TAKE 1 TABLET(50 MG) BY MOUTH AT BEDTIME 90 tablet 0  . atorvastatin (LIPITOR) 20 MG tablet TAKE 1 TABLET(20 MG) BY MOUTH DAILY 90 tablet 0  . DIGOX 125 MCG tablet TAKE 1 TABLET(0.125 MG) BY MOUTH DAILY 90 tablet 0  . ketotifen (ZADITOR) 0.025 % ophthalmic solution Place 1 drop into both eyes 2 (two) times daily.    Marland Kitchen levothyroxine (SYNTHROID, LEVOTHROID) 75 MCG tablet Take 1 tablet (75 mcg total) by mouth daily. 90 tablet 1  . metoprolol succinate (TOPROL-XL) 25 MG 24 hr tablet TAKE 1 TABLET(25 MG) BY MOUTH DAILY 90 tablet 0  . Rivaroxaban (XARELTO) 15 MG TABS tablet Take 1 tablet (15 mg total) by mouth daily with supper. 90 tablet 1  . spironolactone (ALDACTONE) 25 MG tablet TAKE 1/2 TABLET(12.5 MG) BY MOUTH DAILY 45 tablet 0  . Vitamin D, Ergocalciferol, (DRISDOL) 50000 units CAPS capsule Take 50,000 Units by mouth every 7 (seven) days.     No current facility-administered medications on file prior to visit.     Allergies  Allergen Reactions  . Codeine Itching    REACTION: itching  . Erythromycin Nausea And Vomiting    REACTION: nausea and vomiting  . Guaifenesin Er Palpitations  . Hydrocod  Polst-Cpm Polst Er Other (See Comments)    Unknown reaction  . Sulfonamide Derivatives Nausea And Vomiting    REACTION: nausea and vomiting  . Tramadol Hcl Itching    REACTION: itching    Family History  Problem Relation Age of Onset  . Asthma Mother   . Heart disease Mother   . Emphysema Father   . Heart disease Father   . Asthma Grandchild     Social History   Social History  . Marital status: Married    Spouse name: N/A  . Number of children: 1  . Years of education: N/A   Occupational History  . 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 Smoking status: Never Smoker  . Smokeless tobacco: Never Used  . Alcohol  use 0.6 oz/week    1 Glasses of wine per week     Comment: occasional wine or beer  . Drug use: No  . Sexual activity: Yes    Birth control/ protection: None   Other Topics Concern  . None   Social History Narrative  . None   Review of Systems - See HPI.  All other ROS are negative.  BP 110/70   Pulse 78   Temp 97.8 F (36.6 C) (Oral)   Resp 14   Ht _0  (1.626 m)   Wt 106 lb (48.1 kg)   SpO2 99%   BMI 18.19 kg/m   Physical Exam  Constitutional: She is oriented to person, place, and time and well-developed, well-nourished, and in no distress.  HENT:  Head: Normocephalic and atraumatic.  Right Ear: External ear normal.  Left Ear: External ear normal.  Nose: Nose normal.  Mouth/Throat: Oropharynx is clear and moist. No oropharyngeal exudate.  TM within normal limits.  Eyes: Conjunctivae are normal.  Neck: Neck supple.  Cardiovascular: Normal rate, regular rhythm and intact distal pulses.   Pulmonary/Chest: Effort normal and breath sounds normal. No respiratory distress. She has no wheezes. She has no rales. She exhibits no tenderness.  Abdominal: Soft. Bowel sounds are normal. She exhibits no distension and no mass. There is tenderness in the epigastric area. There is no rebound and no guarding.  Neurological: She is alert and oriented to person, place, and time.  Skin: Skin is warm and dry. No rash noted.  Psychiatric: Affect normal.  Vitals reviewed.   Recent Results (from the past 2160 hour(s))  Iron and TIBC     Status: Abnormal   Collection Time: 04/25/17  1:45 AM  Result Value Ref Range   Iron 13 (L) 28 - 170 ug/dL   TIBC 552 (H) 250 - 450 ug/dL   Saturation Ratios 2 (L) 10.4 - 31.8 %   UIBC 539 ug/dL  Ferritin (Iron Binding Protein)     Status: Abnormal   Collection Time: 04/25/17  1:45 AM  Result Value Ref Range   Ferritin 2 (L) 11 - 307 ng/mL  Haptoglobin     Status: Abnormal   Collection Time: 04/25/17  1:45 AM  Result Value Ref Range    Haptoglobin 207 (H) 34 - 200 mg/dL    Comment: (NOTE) Performed At: [facility] [place], [place] [id number] Lindon Romp MD [id number]   CBC w/Diff     Status: Abnormal   Collection Time: 04/25/17  3:17 PM  Result Value Ref Range   WBC 11.5 (H) 4.0 - 10.5 K/uL   RBC 3.46 (  L) 3.87 - 5.11 Mil/uL   Hemoglobin 5.0 Repeated and verified X2. (LL) 12.0 - 15.0 g/dL   HCT 18.5 Repeated and verified X2. (LL) 36.0 - 46.0 %   MCV 53.5 (L) 78.0 - 100.0 fl   MCHC 27.0 (L) 30.0 - 36.0 g/dL   RDW 20.4 (H) 11.5 - 15.5 %   Platelets 688.0 (H) 150.0 - 400.0 K/uL   Neutrophils Relative % 73.0 43.0 - 77.0 %   Lymphocytes Relative 18.1 12.0 - 46.0 %   Monocytes Relative 7.8 3.0 - 12.0 %   Eosinophils Relative 0.4 0.0 - 5.0 %   Basophils Relative 0.7 0.0 - 3.0 %   Neutro Abs 8.3 (H) 1.4 - 7.7 K/uL   Lymphs Abs 2.1 0.7 - 4.0 K/uL   Monocytes Absolute 0.9 0.1 - 1.0 K/uL   Eosinophils Absolute 0.1 0.0 - 0.7 K/uL   Basophils Absolute 0.1 0.0 - 0.1 K/uL  Type and screen     Status: None   Collection Time: 04/25/17  5:55 PM  Result Value Ref Range   ABO/RH(D) O POS    Antibody Screen NEG    Sample Expiration 04/28/2017    Unit Number [id number]    Blood Component Type RED CELLS,LR    Unit division 00    Status of Unit ISSUED,FINAL    Transfusion Status OK TO TRANSFUSE    Crossmatch Result Compatible    Unit Number [id number]    Blood Component Type RED CELLS,LR    Unit division 00    Status of Unit ISSUED,FINAL    Transfusion Status OK TO TRANSFUSE    Crossmatch Result Compatible   ABO/Rh     Status: None   Collection Time: 04/25/17  5:55 PM  Result Value Ref Range   ABO/RH(D) O POS   BPAM RBC     Status: None   Collection Time: 04/25/17  5:55 PM  Result Value Ref Range   ISSUE DATE / TIME [id number]    Blood Product Unit Number [id number]    PRODUCT CODE E0336V00    Unit Type and Rh 5100    Blood Product Expiration Date 701779390300    ISSUE DATE  / TIME [id number]    Blood Product Unit Number [id number]    PRODUCT CODE E0336V00    Unit Type and Rh 5100    Blood Product Expiration Date [id number]   CBC with Differential/Platelet     Status: Abnormal   Collection Time: 04/25/17 11:47 PM  Result Value Ref Range   WBC 12.0 (H) 4.0 - 10.5 K/uL   RBC 3.71 (L) 3.87 - 5.11 MIL/uL   Hemoglobin 5.3 (LL) 12.0 - 15.0 g/dL    Comment: REPEATED TO VERIFY CRITICAL RESULT CALLED TO, READ BACK BY AND VERIFIED WITH: C PRUETT,RN 0026 04/26/17 D BRADLEY    HCT 20.8 (L) 36.0 - 46.0 %   MCV 56.1 (L) 78.0 - 100.0 fL   MCH 14.3 (L) 26.0 - 34.0 pg   MCHC 25.5 (L) 30.0 - 36.0 g/dL   RDW 20.5 (H) 11.5 - 15.5 %   Platelets 688 (H) 150 - 400 K/uL   Neutrophils Relative % 65 %   Lymphocytes Relative 26 %   Monocytes Relative 7 %   Eosinophils Relative 1 %   Basophils Relative 1 %   Neutro Abs 7.9 (H) 1.7 - 7.7 K/uL   Lymphs Abs 3.1 0.7 - 4.0 K/uL   Monocytes Absolute 0.8 0.1 - 1.0 K/uL   Eosinophils Absolute 0.1 0.0 -  0.7 K/uL   Basophils Absolute 0.1 0.0 - 0.1 K/uL   RBC Morphology ELLIPTOCYTES     Comment: SPHEROCYTES POLYCHROMASIA PRESENT   Comprehensive metabolic panel     Status: Abnormal   Collection Time: 04/25/17 11:47 PM  Result Value Ref Range   Sodium 136 135 - 145 mmol/L   Potassium 3.0 (L) 3.5 - 5.1 mmol/L   Chloride 108 101 - 111 mmol/L   CO2 20 (L) 22 - 32 mmol/L   Glucose, Bld 99 65 - 99 mg/dL   BUN 14 6 - 20 mg/dL   Creatinine, Ser 1.31 (H) 0.44 - 1.00 mg/dL   Calcium 9.4 8.9 - 10.3 mg/dL   Total Protein 7.0 6.5 - 8.1 g/dL   Albumin 3.9 3.5 - 5.0 g/dL   AST 19 15 - 41 U/L   ALT 15 14 - 54 U/L   Alkaline Phosphatase 98 38 - 126 U/L   Total Bilirubin 0.5 0.3 - 1.2 mg/dL   GFR calc non Af Amer 41 (L) >60 mL/min   GFR calc Af Amer 48 (L) >60 mL/min    Comment: (NOTE) The eGFR has been calculated using the CKD EPI equation. This calculation has not been validated in all clinical situations. eGFR's persistently <60  mL/min signify possible Chronic Kidney Disease.    Anion gap 8 5 - 15  Troponin I     Status: None   Collection Time: 04/25/17 11:47 PM  Result Value Ref Range   Troponin I <0.03 <0.03 ng/mL  Prepare RBC     Status: None   Collection Time: 04/26/17 12:30 AM  Result Value Ref Range   Order Confirmation ORDER PROCESSED BY BLOOD BANK   POC occult blood, ED     Status: Abnormal   Collection Time: 04/26/17  2:31 AM  Result Value Ref Range   Fecal Occult Bld POSITIVE (A) NEGATIVE  Lactate dehydrogenase     Status: None   Collection Time: 04/26/17  2:36 AM  Result Value Ref Range   LDH 165 98 - 192 U/L  Protime-INR     Status: None   Collection Time: 04/26/17  4:55 AM  Result Value Ref Range   Prothrombin Time 14.6 11.4 - 15.2 seconds   INR 1.15   Magnesium     Status: None   Collection Time: 04/26/17  4:55 AM  Result Value Ref Range   Magnesium 2.1 1.7 - 2.4 mg/dL  Digoxin level     Status: Abnormal   Collection Time: 04/26/17  4:55 AM  Result Value Ref Range   Digoxin Level 0.6 (L) 0.8 - 2.0 ng/mL  CBC     Status: Abnormal   Collection Time: 04/26/17  4:55 AM  Result Value Ref Range   WBC 8.1 4.0 - 10.5 K/uL   RBC 4.15 3.87 - 5.11 MIL/uL   Hemoglobin 6.8 (LL) 12.0 - 15.0 g/dL    Comment: DELTA CHECK NOTED REPEATED TO VERIFY POST TRANSFUSION SPECIMEN CRITICAL VALUE NOTED.  VALUE IS CONSISTENT WITH PREVIOUSLY REPORTED AND CALLED VALUE.    HCT 25.2 (L) 36.0 - 46.0 %   MCV 60.7 (L) 78.0 - 100.0 fL    Comment: DELTA CHECK NOTED REPEATED TO VERIFY POST TRANSFUSION SPECIMEN    MCH 16.4 (L) 26.0 - 34.0 pg   MCHC 27.0 (L) 30.0 - 36.0 g/dL   RDW 25.3 (H) 11.5 - 15.5 %   Platelets 575 (H) 150 - 400 K/uL  Basic metabolic panel     Status: Abnormal  Collection Time: 04/26/17  4:55 AM  Result Value Ref Range   Sodium 135 135 - 145 mmol/L   Potassium 2.9 (L) 3.5 - 5.1 mmol/L   Chloride 107 101 - 111 mmol/L   CO2 19 (L) 22 - 32 mmol/L   Glucose, Bld 95 65 - 99 mg/dL   BUN 11  6 - 20 mg/dL   Creatinine, Ser 1.22 (H) 0.44 - 1.00 mg/dL   Calcium 8.9 8.9 - 10.3 mg/dL   GFR calc non Af Amer 45 (L) >60 mL/min   GFR calc Af Amer 52 (L) >60 mL/min    Comment: (NOTE) The eGFR has been calculated using the CKD EPI equation. This calculation has not been validated in all clinical situations. eGFR's persistently <60 mL/min signify possible Chronic Kidney Disease.    Anion gap 9 5 - 15  APTT     Status: None   Collection Time: 04/26/17  4:55 AM  Result Value Ref Range   aPTT 31 24 - 36 seconds  Glucose, capillary     Status: None   Collection Time: 04/26/17  7:50 AM  Result Value Ref Range   Glucose-Capillary 82 65 - 99 mg/dL  CBC     Status: Abnormal   Collection Time: 04/26/17 10:37 AM  Result Value Ref Range   WBC 8.6 4.0 - 10.5 K/uL   RBC 4.67 3.87 - 5.11 MIL/uL   Hemoglobin 8.6 (L) 12.0 - 15.0 g/dL    Comment: REPEATED TO VERIFY POST TRANSFUSION SPECIMEN    HCT 30.1 (L) 36.0 - 46.0 %   MCV 64.5 (L) 78.0 - 100.0 fL   MCH 18.4 (L) 26.0 - 34.0 pg   MCHC 28.6 (L) 30.0 - 36.0 g/dL   RDW 27.7 (H) 11.5 - 15.5 %   Platelets 555 (H) 150 - 400 K/uL    Comment: PLATELET COUNT CONFIRMED BY SMEAR  Basic metabolic panel     Status: Abnormal   Collection Time: 04/26/17  4:53 PM  Result Value Ref Range   Sodium 137 135 - 145 mmol/L   Potassium 3.8 3.5 - 5.1 mmol/L    Comment: DELTA CHECK NOTED   Chloride 113 (H) 101 - 111 mmol/L   CO2 15 (L) 22 - 32 mmol/L   Glucose, Bld 97 65 - 99 mg/dL   BUN 9 6 - 20 mg/dL   Creatinine, Ser 1.08 (H) 0.44 - 1.00 mg/dL   Calcium 8.7 (L) 8.9 - 10.3 mg/dL   GFR calc non Af Amer 52 (L) >60 mL/min   GFR calc Af Amer >60 >60 mL/min    Comment: (NOTE) The eGFR has been calculated using the CKD EPI equation. This calculation has not been validated in all clinical situations. eGFR's persistently <60 mL/min signify possible Chronic Kidney Disease.    Anion gap 9 5 - 15  CBC     Status: Abnormal   Collection Time: 04/26/17  4:53  PM  Result Value Ref Range   WBC 18.9 (H) 4.0 - 10.5 K/uL    Comment: QUESTIONABLE RESULTS, RECOMMEND RECOLLECT TO VERIFY   RBC 7.16 (H) 3.87 - 5.11 MIL/uL    Comment: QUESTIONABLE RESULTS, RECOMMEND RECOLLECT TO VERIFY   Hemoglobin 13.6 12.0 - 15.0 g/dL    Comment: QUESTIONABLE RESULTS, RECOMMEND RECOLLECT TO VERIFY   HCT 46.2 (H) 36.0 - 46.0 %    Comment: QUESTIONABLE RESULTS, RECOMMEND RECOLLECT TO VERIFY   MCV 64.5 (L) 78.0 - 100.0 fL    Comment: QUESTIONABLE RESULTS, RECOMMEND RECOLLECT  TO VERIFY   MCH 19.0 (L) 26.0 - 34.0 pg    Comment: QUESTIONABLE RESULTS, RECOMMEND RECOLLECT TO VERIFY   MCHC 29.4 (L) 30.0 - 36.0 g/dL    Comment: QUESTIONABLE RESULTS, RECOMMEND RECOLLECT TO VERIFY   RDW 28.2 (H) 11.5 - 15.5 %    Comment: QUESTIONABLE RESULTS, RECOMMEND RECOLLECT TO VERIFY   Platelets  150 - 400 K/uL    QUESTIONABLE RESULTS, RECOMMEND RECOLLECT TO VERIFY  CBC     Status: Abnormal   Collection Time: 04/26/17  6:46 PM  Result Value Ref Range   WBC 19.1 (H) 4.0 - 10.5 K/uL   RBC 6.45 (H) 3.87 - 5.11 MIL/uL    Comment: MIXED RBC POPULATION   Hemoglobin 12.2 12.0 - 15.0 g/dL    Comment: RESULTS VERIFIED VIA RECOLLECT   HCT 42.4 36.0 - 46.0 %   MCV 65.7 (L) 78.0 - 100.0 fL   MCH 18.9 (L) 26.0 - 34.0 pg   MCHC 28.8 (L) 30.0 - 36.0 g/dL   RDW 28.1 (H) 11.5 - 15.5 %   Platelets  150 - 400 K/uL    PLATELET CLUMPS NOTED ON SMEAR, COUNT APPEARS INCREASED  CBC     Status: Abnormal   Collection Time: 04/27/17 12:31 AM  Result Value Ref Range   WBC 17.4 (H) 4.0 - 10.5 K/uL   RBC 4.99 3.87 - 5.11 MIL/uL   Hemoglobin 9.2 (L) 12.0 - 15.0 g/dL    Comment: REPEATED TO VERIFY SPECIMEN CHECKED FOR CLOTS DELTA CHECK NOTED    HCT 32.3 (L) 36.0 - 46.0 %   MCV 64.7 (L) 78.0 - 100.0 fL   MCH 18.4 (L) 26.0 - 34.0 pg   MCHC 28.5 (L) 30.0 - 36.0 g/dL   RDW 27.8 (H) 11.5 - 15.5 %   Platelets 605 (H) 150 - 400 K/uL  Basic metabolic panel     Status: Abnormal   Collection Time: 04/27/17   4:47 AM  Result Value Ref Range   Sodium 140 135 - 145 mmol/L   Potassium 3.0 (L) 3.5 - 5.1 mmol/L   Chloride 114 (H) 101 - 111 mmol/L   CO2 17 (L) 22 - 32 mmol/L   Glucose, Bld 76 65 - 99 mg/dL   BUN 9 6 - 20 mg/dL   Creatinine, Ser 1.15 (H) 0.44 - 1.00 mg/dL   Calcium 8.2 (L) 8.9 - 10.3 mg/dL   GFR calc non Af Amer 48 (L) >60 mL/min   GFR calc Af Amer 56 (L) >60 mL/min    Comment: (NOTE) The eGFR has been calculated using the CKD EPI equation. This calculation has not been validated in all clinical situations. eGFR's persistently <60 mL/min signify possible Chronic Kidney Disease.    Anion gap 9 5 - 15  CBC     Status: Abnormal   Collection Time: 04/27/17  8:37 AM  Result Value Ref Range   WBC 8.1 4.0 - 10.5 K/uL   RBC 4.34 3.87 - 5.11 MIL/uL   Hemoglobin 7.9 (L) 12.0 - 15.0 g/dL   HCT 28.6 (L) 36.0 - 46.0 %   MCV 65.9 (L) 78.0 - 100.0 fL   MCH 18.2 (L) 26.0 - 34.0 pg   MCHC 27.6 (L) 30.0 - 36.0 g/dL   RDW 27.6 (H) 11.5 - 15.5 %   Platelets 507 (H) 150 - 400 K/uL  CBC     Status: Abnormal   Collection Time: 04/27/17  6:17 PM  Result Value Ref Range   WBC 12.2 (  H) 4.0 - 10.5 K/uL   RBC 4.82 3.87 - 5.11 MIL/uL   Hemoglobin 8.8 (L) 12.0 - 15.0 g/dL   HCT 31.8 (L) 36.0 - 46.0 %   MCV 66.0 (L) 78.0 - 100.0 fL   MCH 18.3 (L) 26.0 - 34.0 pg   MCHC 27.7 (L) 30.0 - 36.0 g/dL   RDW 27.9 (H) 11.5 - 15.5 %   Platelets 554 (H) 150 - 400 K/uL  CBC     Status: Abnormal   Collection Time: 04/28/17  4:14 AM  Result Value Ref Range   WBC 8.6 4.0 - 10.5 K/uL   RBC 4.25 3.87 - 5.11 MIL/uL   Hemoglobin 7.6 (L) 12.0 - 15.0 g/dL   HCT 28.2 (L) 36.0 - 46.0 %   MCV 66.4 (L) 78.0 - 100.0 fL   MCH 17.9 (L) 26.0 - 34.0 pg   MCHC 27.0 (L) 30.0 - 36.0 g/dL   RDW 28.4 (H) 11.5 - 15.5 %   Platelets 495 (H) 150 - 400 K/uL  Basic metabolic panel     Status: Abnormal   Collection Time: 04/28/17  4:14 AM  Result Value Ref Range   Sodium 141 135 - 145 mmol/L   Potassium 3.4 (L) 3.5 - 5.1  mmol/L   Chloride 115 (H) 101 - 111 mmol/L   CO2 20 (L) 22 - 32 mmol/L   Glucose, Bld 84 65 - 99 mg/dL   BUN <5 (L) 6 - 20 mg/dL   Creatinine, Ser 1.17 (H) 0.44 - 1.00 mg/dL   Calcium 8.4 (L) 8.9 - 10.3 mg/dL   GFR calc non Af Amer 47 (L) >60 mL/min   GFR calc Af Amer 55 (L) >60 mL/min    Comment: (NOTE) The eGFR has been calculated using the CKD EPI equation. This calculation has not been validated in all clinical situations. eGFR's persistently <60 mL/min signify possible Chronic Kidney Disease.    Anion gap 6 5 - 15  CBC w/Diff     Status: Abnormal   Collection Time: 05/03/17 11:49 AM  Result Value Ref Range   WBC 10.0 4.0 - 10.5 K/uL   RBC 5.32 (H) 3.87 - 5.11 Mil/uL   Hemoglobin 10.8 (L) 12.0 - 15.0 g/dL   HCT 36.0 36.0 - 46.0 %   MCV 67.7 Repeated and verified X2. (L) 78.0 - 100.0 fl   MCHC 29.9 (L) 30.0 - 36.0 g/dL   RDW 38.0 (H) 11.5 - 15.5 %   Platelets 532.0 (H) 150.0 - 400.0 K/uL   Neutrophils Relative % 71.4 43.0 - 77.0 %   Lymphocytes Relative 18.6 12.0 - 46.0 %   Monocytes Relative 7.9 3.0 - 12.0 %   Eosinophils Relative 1.1 0.0 - 5.0 %   Basophils Relative 1.0 0.0 - 3.0 %   Neutro Abs 7.2 1.4 - 7.7 K/uL   Lymphs Abs 1.9 0.7 - 4.0 K/uL   Monocytes Absolute 0.8 0.1 - 1.0 K/uL   Eosinophils Absolute 0.1 0.0 - 0.7 K/uL   Basophils Absolute 0.1 0.0 - 0.1 K/uL  Basic metabolic panel     Status: Abnormal   Collection Time: 05/03/17 11:49 AM  Result Value Ref Range   Sodium 141 135 - 145 mEq/L   Potassium 3.4 (L) 3.5 - 5.1 mEq/L   Chloride 106 96 - 112 mEq/L   CO2 24 19 - 32 mEq/L   Glucose, Bld 97 70 - 99 mg/dL   BUN 9 6 - 23 mg/dL   Creatinine, Ser 1.15 0.40 -  1.20 mg/dL   Calcium 10.0 8.4 - 10.5 mg/dL   GFR 49.93 (L) >60.00 mL/min   Assessment/Plan: Iron deficiency anemia Improving per recent CBC. Will reassess today. Still some SOBOE and orthostatic lightheadedness. Discussed appropriate water intake. Concern that GERD is uncontrolled and contributing to  some of her SOB with exertion. Will switch Omeprazole to Protonix. GERD diet reviewed. Patient with chronic II/VI Systolic murmur. Echo from 2016 within normal limits. If anemia resolves but symptoms continue will need repeat echocardiogram.     Leeanne Rio, PA-C

## 2017-05-11 NOTE — Assessment & Plan Note (Signed)
Improving per recent CBC. Will reassess today. Still some SOBOE and orthostatic lightheadedness. Discussed appropriate water intake. Concern that GERD is uncontrolled and contributing to some of her SOB with exertion. Will switch Omeprazole to Protonix. GERD diet reviewed. Patient with chronic II/VI Systolic murmur. Echo from 2016 within normal limits. If anemia resolves but symptoms continue will need repeat echocardiogram.

## 2017-05-16 ENCOUNTER — Encounter: Payer: Self-pay | Admitting: Physician Assistant

## 2017-05-16 ENCOUNTER — Ambulatory Visit (INDEPENDENT_AMBULATORY_CARE_PROVIDER_SITE_OTHER): Payer: Medicare Other | Admitting: Physician Assistant

## 2017-05-16 VITALS — BP 118/78 | HR 68 | Ht 64.0 in | Wt 106.0 lb

## 2017-05-16 DIAGNOSIS — B370 Candidal stomatitis: Secondary | ICD-10-CM | POA: Diagnosis not present

## 2017-05-16 DIAGNOSIS — D369 Benign neoplasm, unspecified site: Secondary | ICD-10-CM | POA: Diagnosis not present

## 2017-05-16 DIAGNOSIS — R141 Gas pain: Secondary | ICD-10-CM

## 2017-05-16 DIAGNOSIS — R140 Abdominal distension (gaseous): Secondary | ICD-10-CM

## 2017-05-16 DIAGNOSIS — D508 Other iron deficiency anemias: Secondary | ICD-10-CM

## 2017-05-16 MED ORDER — NYSTATIN 100000 UNIT/ML MT SUSP: 5.0000 mL | Freq: Four times a day (QID) | OROMUCOSAL | 0 refills | Status: DC

## 2017-05-16 NOTE — Progress Notes (Signed)
Chief Complaint: Iron deficiency anemia, gas  HPI:  Kaitlyn Adams is a 66 year old Caucasian female with a past medical history as listed below, including chronic anticoagulation with Xarelto for A. Fib,  who presents to clinic today for follow-up after being seen in the hospital for iron deficiency anemia.    Patient has been followed by Dr. Fuller Plan in the distant past, most recently patient was seen in the hospital 04/26/17 by Dr. Silverio Decamp for anemia and a FOBT positive stool. Patient had EGD and colonoscopy 04/27/17. EGD revealed mildly severe monilial and candidiasis esophagitis as well as a small hiatal hernia was otherwise normal. Patient was started on Diflucan 100 mg by mouth 10 days. Colonoscopy revealed melanosis in the colon, lipomatous ileocecal valve which was biopsied, one 10 mm polyp in the transverse colon, diverticulosis in the sigmoid and descending colon as well as nonbleeding internal hemorrhoids. Pathology revealed tubular adenomas. The ileocecal valve biopsy was positive for tubular adenoma which is thought to be flat lateral spreading polyp. Patient was referred to [facility] for advanced polypectomy. Patient was also scheduled for a pill capsule endoscopy which she completed 05/10/17 for further evaluation of her small bowel. We do not have results yet. Most recent hemoglobin was 11.4 on 05/11/17.    Today, the patient expresses that she has been having some generalized abdominal discomfort associated with gas and bloating over the past month. She tells me that yesterday she woke up with such severe bloating and gas pains that and took about 4-5 Gas-X pills within 20 minutes. The patient tells me this gradually wore off and left her with just a dull uncomfortable abdomen but she did end up passing gas and felt much better.     Patient also expresses daily reflux symptoms which have continued. She was recently switched from Omeprazole to Pantoprazole 40 mg daily by her PCP last Friday. She  tells me that she has continued with occasional reflux symptoms over the past 2 days but "I don't think I have been on it long enough to tell if it's working".     Patient also continues to complain about a dry mouth which she was experiencing prior to Candida infection. She did finish her Fluconazole as prescribed in the hospital.    Patient also has questions today regarding whether or not this upcoming colonoscopy at [facility] is really necessary.    Patient denies fever, chills, blood in her stool, melena, weight loss, anorexia, nausea or vomiting.  Past Medical History:  Diagnosis Date  . Anemia   . Asthma    - normal spirometry 2009 FEV1  >90% predicted.  - methacoline challenge neg 11/09  . Bronchitis    recurrent  . Cataract   . Chronic cough    - sinus CT 03-23-10 > neg.  - allergy profile 03-23-10 > nl, IgE 14.  - flutter valve rx 03-23-10  . Fibromyalgia   . GERD (gastroesophageal reflux disease)    exacerbating VCD  . Glaucoma   . Hyperlipidemia   . Hypertension   . Hyperthyroidism    with hot nodule.  - total thyroidectomy 11-05-08 benign -> subsequent hypothyroidism.  Marland Kitchen Hypokalemia   . Hypothyroidism   . Hypothyroidism    a. following thyroidectomy.  . Mild CAD    a. LHC 04/2015 - mild nonobstructive CAD with 30% dLAD, 40% D1, 25% pLCx, 30% mRCA, 30% RPDA, normal EF >65% with normal LVEDP.  . Orthostatic hypotension   . Osteoarthritis   . Paroxysmal atrial  fibrillation ([place])   . Renal insufficiency   . Vasomotor rhinitis    exacerbates VCD  . Vocal cord dysfunction    proven on FOB 9/09    Past Surgical History:  Procedure Laterality Date  . ABDOMINAL HYSTERECTOMY  1980  . ABDOMINAL HYSTERECTOMY    . BASAL CELL CARCINOMA EXCISION  11/08  . CARDIAC CATHETERIZATION N/A 05/15/2015   Procedure: Left Heart Cath and Coronary Angiography;  Surgeon: Sherren Mocha, MD;  Location: [place] [facility];  Service: Cardiovascular;  Laterality: N/A;  . COLONOSCOPY N/A 04/27/2017   Dr  Silverio Decamp for scant rectal bleeding and iron def anemia: Melanosis coli, lipomatous IC valve, left sided tics, non-bleeding hemorrhoid, 10 mm transverse polyp.    . ESOPHAGOGASTRODUODENOSCOPY N/A 04/27/2017   Mauri Pole, MD; [facility] ENDOSCOPY. for iron def anemia.  Monilial/candidial esophagitis. Small HH.    . EYE SURGERY    . scar tissue removal  1982  . THYROID SURGERY  2010  . THYROIDECTOMY  11-05-08    Current Outpatient Prescriptions  Medication Sig Dispense Refill  . Acetaminophen (TYLENOL ARTHRITIS PAIN PO) Take 325 mg by mouth daily as needed (FOR MILD PAIN).     Marland Kitchen ALPRAZolam (XANAX) 0.5 MG tablet Take 1 tablet (0.5 mg total) by mouth 2 (two) times daily as needed for anxiety. 60 tablet 3  . amitriptyline (ELAVIL) 50 MG tablet TAKE 1 TABLET(50 MG) BY MOUTH AT BEDTIME 90 tablet 0  . atorvastatin (LIPITOR) 20 MG tablet TAKE 1 TABLET(20 MG) BY MOUTH DAILY 90 tablet 0  . DIGOX 125 MCG tablet TAKE 1 TABLET(0.125 MG) BY MOUTH DAILY 90 tablet 0  . levothyroxine (SYNTHROID, LEVOTHROID) 75 MCG tablet Take 1 tablet (75 mcg total) by mouth daily. 90 tablet 1  . metoprolol succinate (TOPROL-XL) 25 MG 24 hr tablet TAKE 1 TABLET(25 MG) BY MOUTH DAILY 90 tablet 0  . pantoprazole (PROTONIX) 40 MG tablet Take 1 tablet (40 mg total) by mouth daily. 30 tablet 3  . Rivaroxaban (XARELTO) 15 MG TABS tablet Take 1 tablet (15 mg total) by mouth daily with supper. 90 tablet 1  . spironolactone (ALDACTONE) 25 MG tablet TAKE 1/2 TABLET(12.5 MG) BY MOUTH DAILY 45 tablet 0  . Vitamin D, Ergocalciferol, (DRISDOL) 50000 units CAPS capsule Take 50,000 Units by mouth every 7 (seven) days.    Marland Kitchen nystatin (MYCOSTATIN) 100000 UNIT/ML suspension Use as directed 5 mLs (500,000 Units total) in the mouth or throat 4 (four) times daily. Swish and swallow x 14 days. 473 mL 0   No current facility-administered medications for this visit.     Allergies as of 05/16/2017 - Review Complete 05/16/2017  Allergen Reaction Noted    . Codeine Itching   . Erythromycin Nausea And Vomiting   . Guaifenesin er Palpitations 05/22/2015  . Hydrocod polst-cpm polst er Other (See Comments) 05/22/2015  . Sulfonamide derivatives Nausea And Vomiting   . Tramadol hcl Itching 03/31/2010    Family History  Problem Relation Age of Onset  . Asthma Mother   . Heart disease Mother   . Emphysema Father   . Heart disease Father   . Asthma Grandchild     Social History   Social History  . Marital status: Married    Spouse name: N/A  . Number of children: 1  . Years of education: N/A   Occupational History  . 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 Smoking  status: Never Smoker  . Smokeless tobacco: Never Used  . Alcohol use 0.6 oz/week    1 Glasses of wine per week     Comment: occasional wine or beer  . Drug use: No  . Sexual activity: Yes    Birth control/ protection: None   Other Topics Concern  . Not on file   Social History Narrative  . No narrative on file    Review of Systems:    Constitutional: No weight loss, fever or chills Cardiovascular: No chest pain  Respiratory: Positive for some continued SOB with exertion Gastrointestinal: See HPI and otherwise negative   Physical Exam:  Vital signs: BP 118/78   Pulse 68   Ht 5\' 4"  (1.626 m)   Wt 106 lb (48.1 kg)   BMI 18.19 kg/m   Constitutional:   Thin appearing Pleasant Caucasian female appears to be in NAD, Well developed, Well nourished, alert and cooperative Mouth: White spots on back of tongue Respiratory: Respirations even and unlabored. Lungs clear to auscultation bilaterally.   No wheezes, crackles, or rhonchi.  Cardiovascular: Normal S1, S2. No MRG. Regular rate and rhythm. No peripheral edema, cyanosis or pallor.  Gastrointestinal:  Soft, nondistended, nontender. No rebound or guarding.Increased BS all four quadrants No appreciable masses or hepatomegaly. Psychiatric: Demonstrates good  judgement and reason without abnormal affect or behaviors.  MOST RECENT LABS AND IMAGING: CBC    Component Value Date/Time   WBC 5.9 05/11/2017 0850   RBC 5.17 (H) 05/11/2017 0850   HGB 11.4 (L) 05/11/2017 0850   HCT 37.0 05/11/2017 0850   PLT 378.0 05/11/2017 0850   MCV 71.5 (L) 05/11/2017 0850   MCH 17.9 (L) 04/28/2017 0414   MCHC 30.7 05/11/2017 0850   RDW 37.6 (H) 05/11/2017 0850   LYMPHSABS 1.3 05/11/2017 0850   MONOABS 0.5 05/11/2017 0850   EOSABS 0.0 05/11/2017 0850   BASOSABS 0.1 05/11/2017 0850    CMP     Component Value Date/Time   NA 141 05/03/2017 1149   K 3.4 (L) 05/03/2017 1149   CL 106 05/03/2017 1149   CO2 24 05/03/2017 1149   GLUCOSE 97 05/03/2017 1149   BUN 9 05/03/2017 1149   CREATININE 1.15 05/03/2017 1149   CREATININE 0.84 06/01/2015 0959   CALCIUM 10.0 05/03/2017 1149   PROT 7.0 04/25/2017 2347   ALBUMIN 3.9 04/25/2017 2347   AST 19 04/25/2017 2347   ALT 15 04/25/2017 2347   ALKPHOS 98 04/25/2017 2347   BILITOT 0.5 04/25/2017 2347   GFRNONAA 47 (L) 04/28/2017 0414   GFRNONAA 83 05/18/2015 1341   GFRAA 55 (L) 04/28/2017 0414   GFRAA >89 05/18/2015 1341    Assessment: 1. IDA: EGD and colonoscopy unrevealing for source, pill capsule endoscopy pending 2. Generalized abdominal discomfort: Over the past month, likely related to increasing gas and bloating 3. Gas/bloating: Increased recently, consider relation to IBS versus other  4. Oral candidiasis: Seen at time of exam today, patient does continue with dry mouth 5. Adenomatous polyp: at ileocecal valve-set for polypectomy at [facility] in October  Plan: 1. Awaiting results from recent pill capsule endoscopy completed 05/10/17 2. Patient is scheduled for a colonoscopy with removal of flat tubular adenoma at [facility] the first week of October. Did discuss recent pathology and recommendations discussed why it is important for the patient to proceed. 3. Patient continues with thrush, prescribed Nystatin swish  and swallow 4 times a day 7 days 4. Discussed using Gas-X prior to meals to help with  increase in gas and bloating recently which is likely related to IBS 5. Patient to follow in clinic in 6-8 weeks or sooner with me or Dr. Royston Cowper, PA-C [facility] 05/16/2017, 12:38 PM  Cc: Midge Minium, MD

## 2017-05-16 NOTE — Patient Instructions (Signed)
We have sent the following medications to your pharmacy for you to pick up at your convenience: Nystatin oral suspension swish and swallow x 14 days.   Gas x before meals  Continue pantoprazole 40 mg daily 30-60 mins before breakfast.

## 2017-05-17 NOTE — Progress Notes (Signed)
Reviewed and agree with initial management plan.  Jervon Ream T. Imanie Darrow, MD FACG 

## 2017-05-22 ENCOUNTER — Other Ambulatory Visit: Payer: Medicare Other

## 2017-05-22 ENCOUNTER — Encounter: Payer: Self-pay | Admitting: Internal Medicine

## 2017-05-22 ENCOUNTER — Telehealth: Payer: Self-pay

## 2017-05-22 ENCOUNTER — Other Ambulatory Visit (INDEPENDENT_AMBULATORY_CARE_PROVIDER_SITE_OTHER): Payer: Medicare Other

## 2017-05-22 ENCOUNTER — Ambulatory Visit (INDEPENDENT_AMBULATORY_CARE_PROVIDER_SITE_OTHER): Payer: Medicare Other | Admitting: Internal Medicine

## 2017-05-22 VITALS — BP 108/64 | HR 77 | Ht 64.0 in | Wt 106.2 lb

## 2017-05-22 DIAGNOSIS — R05 Cough: Secondary | ICD-10-CM | POA: Insufficient documentation

## 2017-05-22 DIAGNOSIS — R058 Other specified cough: Secondary | ICD-10-CM

## 2017-05-22 LAB — CBC WITH DIFFERENTIAL/PLATELET
BASOS ABS: 0.1 10*3/uL (ref 0.0–0.1)
Basophils Relative: 1.1 % (ref 0.0–3.0)
EOS ABS: 0.1 10*3/uL (ref 0.0–0.7)
Eosinophils Relative: 1.9 % (ref 0.0–5.0)
HEMATOCRIT: 38.5 % (ref 36.0–46.0)
Hemoglobin: 12.1 g/dL (ref 12.0–15.0)
LYMPHS ABS: 2.1 10*3/uL (ref 0.7–4.0)
LYMPHS PCT: 26.9 % (ref 12.0–46.0)
MCHC: 31.4 g/dL (ref 30.0–36.0)
MCV: 73.9 fl — ABNORMAL LOW (ref 78.0–100.0)
Monocytes Absolute: 0.5 10*3/uL (ref 0.1–1.0)
Monocytes Relative: 6.3 % (ref 3.0–12.0)
NEUTROS ABS: 4.9 10*3/uL (ref 1.4–7.7)
NEUTROS PCT: 63.8 % (ref 43.0–77.0)
PLATELETS: 319 10*3/uL (ref 150.0–400.0)
RBC: 5.21 Mil/uL — ABNORMAL HIGH (ref 3.87–5.11)
RDW: 33.3 % — ABNORMAL HIGH (ref 11.5–15.5)
WBC: 7.6 10*3/uL (ref 4.0–10.5)

## 2017-05-22 MED ORDER — PANTOPRAZOLE SODIUM 40 MG PO TBEC: DELAYED_RELEASE_TABLET | ORAL | 3 refills | Status: DC

## 2017-05-22 MED ORDER — BENZONATATE 200 MG PO CAPS: ORAL_CAPSULE | ORAL | 2 refills | Status: DC

## 2017-05-22 NOTE — Progress Notes (Signed)
Subjective:    Patient ID: Kaitlyn Adams, female    DOB: 1951/01/17 .   MRN: [id number]    Brief patient profile:  77 yowf never smoker  with cyclic cough and VCD with GERD and upper airway instability.. Methacholine Challenge 06/2008  Neg for asthma     History of Present Illness   05/22/2017    ov/Wert re: recurrent pattern of episodic/ cyclical cough x 10 y Chief Complaint  Patient presents with  . Pulmonary Consult    Referred by Dr. Birdie Riddle. Pt c/o cough and SOB since end of July 2018. She had PNA at that time and shortly after was found to have low hemoglobin.   She states she gets SOB with minimal exertion and with talking alot. Her cough is non prod and gets worse in the evenings.   pattern of cough going back x 10 y prior to OV  Recurs sev times per year better with abx/ prednisone with pattern in between of no resp symptoms at all then sudden onset non -stop coughing sometimes with obvious uri and sometimes not - this one came on end of July 2018 "like a bad cold" "turned into pna' (no as dz on cxr's during this time, just non-specific increased linear markings by my reivew)  No better on inhlalers Cough is worse before supper and as evening goes on and does not flare typically hs/ worse with voice use  Using Mint lozenges  protonix 40 mg q am but no food x 2.5 h    No obvious day to day or daytime variability or assoc excess/ purulent sputum or mucus plugs or hemoptysis or cp or chest tightness, subjective wheeze or overt sinus or hb symptoms. No unusual exp hx or h/o childhood pna/ asthma or knowledge of premature birth.  Sleeping ok flat without nocturnal  or early am exacerbation  of respiratory  c/o's or need for noct saba. Also denies any obvious fluctuation of symptoms with weather or environmental changes or other aggravating or alleviating factors except as outlined above   Current Allergies, Complete Past Medical History, Past Surgical History, Family History, and  Social History were reviewed in [employer] record.  ROS  The following are not active complaints unless bolded Hoarseness, sore throat, dysphagia, dental problems, itching, sneezing,  nasal congestion or discharge of excess mucus or purulent secretions, ear ache,   fever, chills, sweats, unintended wt loss or wt gain, classically pleuritic or exertional cp,  orthopnea pnd or leg swelling, presyncope, palpitations, abdominal pain, anorexia, nausea, vomiting, diarrhea  or change in bowel habits or change in bladder habits, change in stools or change in urine, dysuria, hematuria,  rash, arthralgias, visual complaints, headache, numbness, weakness or ataxia or problems with walking or coordination,  change in mood/affect or memory.        Current Meds  Medication Sig  . Acetaminophen (TYLENOL ARTHRITIS PAIN PO) Take 325 mg by mouth daily as needed (FOR MILD PAIN).   Marland Kitchen ALPRAZolam (XANAX) 0.5 MG tablet Take 1 tablet (0.5 mg total) by mouth 2 (two) times daily as needed for anxiety.  Marland Kitchen amitriptyline (ELAVIL) 50 MG tablet TAKE 1 TABLET(50 MG) BY MOUTH AT BEDTIME  . atorvastatin (LIPITOR) 20 MG tablet TAKE 1 TABLET(20 MG) BY MOUTH DAILY  . dextromethorphan (DELSYM) 30 MG/5ML liquid Take by mouth as needed for cough.  Marland Kitchen DIGOX 125 MCG tablet TAKE 1 TABLET(0.125 MG) BY MOUTH DAILY  . levothyroxine (SYNTHROID, LEVOTHROID) 75 MCG tablet Take  1 tablet (75 mcg total) by mouth daily.  . metoprolol succinate (TOPROL-XL) 25 MG 24 hr tablet TAKE 1 TABLET(25 MG) BY MOUTH DAILY  . pantoprazole (PROTONIX) 40 MG tablet Take 30- 60 min before your first and last meals of the day  . Rivaroxaban (XARELTO) 15 MG TABS tablet Take 1 tablet (15 mg total) by mouth daily with supper.  Marland Kitchen spironolactone (ALDACTONE) 25 MG tablet TAKE 1/2 TABLET(12.5 MG) BY MOUTH DAILY  . Vitamin D, Ergocalciferol, (DRISDOL) 50000 units CAPS capsule Take 50,000 Units by mouth every 7 (seven) days.  .   benzonatate (TESSALON)  100 MG capsule Take 1 capsule by mouth 3 (three) times daily as needed.  .   pantoprazole (PROTONIX) 40 MG tablet Take 1 tablet (40 mg total) by mouth daily.          Objective:   Physical Exam   amb hoarse wf nad with mint in mouth   Wt Readings from Last 3 Encounters:  05/22/17 106 lb 3.2 oz (48.2 kg)  05/16/17 106 lb (48.1 kg)  05/11/17 106 lb (48.1 kg)    Vital signs reviewed  - Note on arrival 02 sats  100% on RA      HEENT: nl dentition, turbinates bilaterally, and oropharynx. Nl external ear canals without cough reflex   NECK :  without JVD/Nodes/TM/ nl carotid upstrokes bilaterally   LUNGS: no acc muscle use,  Nl contour chest which is clear to A and P bilaterally without cough on insp or exp maneuvers   CV:  RRR  no s3 or murmur or increase in P2, and no edema   ABD:  soft and nontender with nl inspiratory excursion in the supine position. No bruits or organomegaly appreciated, bowel sounds nl  MS:  Nl gait/ ext warm without deformities, calf tenderness, cyanosis or clubbing No obvious joint restrictions   SKIN: warm and dry without lesions    NEURO:  alert, approp, nl sensorium with  no motor or cerebellar deficits apparent.      I personally reviewed images and agree with radiology impression as follows:  CXR:   04/25/17  No active cardiopulmonary disease.          Assessment & Plan:

## 2017-05-22 NOTE — Patient Instructions (Addendum)
For cough > tessilon 200 up to three times a day as needed  When cough is active > protonix  Take 30- 60 min before your first and last meals of the day   GERD (REFLUX)  is an extremely common cause of respiratory symptoms just like yours , many times with no obvious heartburn at all.    It can be treated with medication, but also with lifestyle changes including elevation of the head of your bed (ideally with 6 inch  bed blocks),  Smoking cessation, avoidance of late meals, excessive alcohol, and avoid fatty foods, chocolate, peppermint, colas, red wine, and acidic juices such as orange juice.  NO MINT OR MENTHOL PRODUCTS SO NO COUGH DROPS  USE SUGARLESS CANDY INSTEAD (Jolley ranchers or Stover's or Life Savers) or even ice chips will also do - the key is to swallow to prevent all throat clearing. NO OIL BASED VITAMINS - use powdered substitutes.     Please remember to go to the lab department downstairs in the basement  for your tests - we will call you with the results when they are available.    If you are satisfied with your treatment plan,  let your doctor know and he/she can either refill your medications or you can return here when your prescription runs out.     If in any way you are not 100% satisfied,  please tell us.  If 100% better, tell your friends!  Pulmonary follow up is as needed

## 2017-05-22 NOTE — Telephone Encounter (Signed)
Received clearance from [facility] cleared patient for upcoming colonoscopy.Advised ok to hold Xarelto 2 days prior.Clearance faxed back to fax # [phone].

## 2017-05-23 ENCOUNTER — Telehealth: Payer: Self-pay | Admitting: Cardiology

## 2017-05-23 DIAGNOSIS — D122 Benign neoplasm of ascending colon: Secondary | ICD-10-CM | POA: Diagnosis not present

## 2017-05-23 LAB — RESPIRATORY ALLERGY PROFILE REGION II ~~LOC~~
Allergen, A. alternata, m6: <0.1 kU/L
Allergen, Comm Silver Birch, t9: <0.1 kU/L
Allergen, Cottonwood, t14: <0.1 kU/L
Allergen, Mouse Urine Protein, e78: <0.1 kU/L
Allergen, Oak,t7: <0.1 kU/L
Allergen, P. notatum, m1: <0.1 kU/L
Bermuda Grass: <0.1 kU/L
Box Elder IgE: <0.1 kU/L
CLASS: 0
CLASS: 0
CLASS: 0
CLASS: 0
CLASS: 0
CLASS: 0
CLASS: 0
CLASS: 0
CLASS: 0
CLASS: 0
CLASS: 0
Class: 0
Class: 0
Class: 0
Class: 0
Class: 0
Class: 0
Class: 0
Class: 0
Class: 0
Class: 0
Class: 0
Class: 0
Class: 0
Cockroach: <0.1 kU/L
Dog Dander: <0.1 kU/L
Elm IgE: <0.1 kU/L
IgE (Immunoglobulin E), Serum: 6 kU/L (ref <=114)
Pecan/Hickory Tree IgE: <0.1 kU/L
Rough Pigweed  IgE: <0.1 kU/L

## 2017-05-23 LAB — INTERPRETATION:

## 2017-05-23 LAB — PATHOLOGIST SMEAR REVIEW

## 2017-05-23 NOTE — Assessment & Plan Note (Addendum)
Neg MCT 06/2008  Allergy profile 05/22/2017 >  Eos 0.1 /  IgE  Pending  - max gerd rx during cough rec 05/22/2017   Upper airway cough syndrome (previously labeled PNDS),  is so named because it's frequently impossible to sort out how much is  CR/sinusitis with freq throat clearing (which can be related to primary GERD)   vs  causing  secondary (" extra esophageal")  GERD from wide swings in gastric pressure that occur with throat clearing, often  promoting self use of mint and menthol lozenges that reduce the lower esophageal sphincter tone and exacerbate the problem further in a cyclical fashion.   These are the same pts (now being labeled as having "irritable larynx syndrome" by some cough centers) who not infrequently have a history of having failed to tolerate ace inhibitors,  dry powder inhalers or biphosphonates or report having atypical/extraesophageal reflux symptoms that don't respond to standard doses of PPI  and are easily confused as having aecopd or asthma flares by even experienced allergists/ pulmonologists (myself included).   Of the three most common causes of  Sub-acute or recurrent or chronic cough, only one (GERD)  can actually contribute to/ trigger  the other two (asthma and post nasal drip syndrome)  and perpetuate the cylce of cough.  While not intuitively obvious, many patients with chronic low grade reflux do not cough until there is a primary insult that disturbs the protective epithelial barrier and exposes sensitive nerve endings.   This is typically viral as very likely was the case here  but can be direct physical injury such as with an endotracheal tube.   The point is that once this occurs, it is difficult to eliminate the cycle  using anything but a maximally effective acid suppression regimen at least in the short run, accompanied by an appropriate diet to address non acid GERD and control / eliminate the cough itself for at least 3 days.   This approach has worked in  past and no need to re-invent the wheel on this flare:  rec max rx for gerd and try to use non-narcotic approach this time with max doses of tessalon and no mints.   Total time devoted to counseling  > 50 % of initial 60 min office visit:  review case with pt/ discussion of options/alternatives/ personally creating written customized instructions  in presence of pt  then going over those specific  Instructions directly with the pt including how to use all of the meds but in particular covering each new medication in detail and the difference between the maintenance= "automatic" meds and the prns using an action plan format for the latter (If this problem/symptom => do that organization reading Left to right).  Please see AVS from this visit for a full list of these instructions which I personally wrote for this pt and  are unique to this visit.

## 2017-05-23 NOTE — Telephone Encounter (Signed)
Yes may hold Xarelto for 48-72 hours prior to colonoscopy.  Peter Martinique MD, [facility]

## 2017-05-23 NOTE — Telephone Encounter (Signed)
Electronically faxed to provided number.

## 2017-05-23 NOTE — Telephone Encounter (Signed)
No answer when dialed.  I spoke to Gwenlyn Perking out of office - I could not locate electronic documentation of clearance, and Nya is unable to locate hard copy of clearance form.  Pt will need documentation from Dr. Martinique that it is safe to hold Xarelto for colonoscopy - will refax to [facility] at provided number.

## 2017-05-23 NOTE — Telephone Encounter (Signed)
New message     Per [facility]. Please re-fax  Clearance to [facility] using  Different fax [phone]. Patient in office now.

## 2017-05-29 ENCOUNTER — Telehealth: Payer: Self-pay

## 2017-05-29 NOTE — Telephone Encounter (Signed)
-----   Message from Alfredia Ferguson, PA-C sent at 05/29/2017 12:23 PM EDT ----- Regarding: capsule Jimmy Plessinger  Capsule was negative -no findings to explain her iron deficiency anemia ----- Message ----- From: Ladene Artist, MD Sent: 05/29/2017   8:55 AM To: Alfredia Ferguson, PA-C  Please arrange office follow up visit with me and have Mikaili Flippin call results to patient. Thx.

## 2017-05-29 NOTE — Telephone Encounter (Signed)
Patient notified of the results and recommendations She is asked to keep the follow up with Dr. Fuller Plan that was previously  Scheduled for 07/05/17

## 2017-06-01 DIAGNOSIS — D122 Benign neoplasm of ascending colon: Secondary | ICD-10-CM | POA: Diagnosis not present

## 2017-06-01 DIAGNOSIS — I10 Essential (primary) hypertension: Secondary | ICD-10-CM | POA: Diagnosis not present

## 2017-06-01 DIAGNOSIS — I491 Atrial premature depolarization: Secondary | ICD-10-CM | POA: Diagnosis not present

## 2017-06-01 DIAGNOSIS — E890 Postprocedural hypothyroidism: Secondary | ICD-10-CM | POA: Diagnosis not present

## 2017-06-01 DIAGNOSIS — K6389 Other specified diseases of intestine: Secondary | ICD-10-CM | POA: Diagnosis not present

## 2017-06-01 DIAGNOSIS — K219 Gastro-esophageal reflux disease without esophagitis: Secondary | ICD-10-CM | POA: Diagnosis not present

## 2017-06-01 DIAGNOSIS — D649 Anemia, unspecified: Secondary | ICD-10-CM | POA: Diagnosis not present

## 2017-06-01 DIAGNOSIS — D126 Benign neoplasm of colon, unspecified: Secondary | ICD-10-CM | POA: Diagnosis not present

## 2017-06-01 DIAGNOSIS — E785 Hyperlipidemia, unspecified: Secondary | ICD-10-CM | POA: Diagnosis not present

## 2017-06-01 DIAGNOSIS — I2510 Atherosclerotic heart disease of native coronary artery without angina pectoris: Secondary | ICD-10-CM | POA: Diagnosis not present

## 2017-06-01 DIAGNOSIS — D120 Benign neoplasm of cecum: Secondary | ICD-10-CM | POA: Diagnosis not present

## 2017-06-01 DIAGNOSIS — K635 Polyp of colon: Secondary | ICD-10-CM | POA: Diagnosis not present

## 2017-06-01 DIAGNOSIS — Z7901 Long term (current) use of anticoagulants: Secondary | ICD-10-CM | POA: Diagnosis not present

## 2017-06-01 DIAGNOSIS — I4891 Unspecified atrial fibrillation: Secondary | ICD-10-CM | POA: Diagnosis not present

## 2017-06-01 DIAGNOSIS — M797 Fibromyalgia: Secondary | ICD-10-CM | POA: Diagnosis not present

## 2017-06-01 DIAGNOSIS — Z79899 Other long term (current) drug therapy: Secondary | ICD-10-CM | POA: Diagnosis not present

## 2017-06-01 LAB — HM COLONOSCOPY

## 2017-06-06 ENCOUNTER — Encounter: Payer: Self-pay | Admitting: General Practice

## 2017-06-19 ENCOUNTER — Other Ambulatory Visit: Payer: Self-pay | Admitting: Family Medicine

## 2017-06-19 ENCOUNTER — Other Ambulatory Visit: Payer: Self-pay | Admitting: Cardiology

## 2017-06-19 DIAGNOSIS — E876 Hypokalemia: Secondary | ICD-10-CM

## 2017-06-19 NOTE — Telephone Encounter (Signed)
REFILL 

## 2017-07-05 ENCOUNTER — Encounter: Payer: Self-pay | Admitting: Gastroenterology

## 2017-07-05 ENCOUNTER — Ambulatory Visit (INDEPENDENT_AMBULATORY_CARE_PROVIDER_SITE_OTHER): Payer: Medicare Other | Admitting: Gastroenterology

## 2017-07-05 VITALS — BP 112/70 | HR 72 | Ht 63.0 in | Wt 105.4 lb

## 2017-07-05 DIAGNOSIS — R141 Gas pain: Secondary | ICD-10-CM

## 2017-07-05 DIAGNOSIS — D500 Iron deficiency anemia secondary to blood loss (chronic): Secondary | ICD-10-CM

## 2017-07-05 DIAGNOSIS — R1032 Left lower quadrant pain: Secondary | ICD-10-CM | POA: Diagnosis not present

## 2017-07-05 MED ORDER — DICYCLOMINE HCL 10 MG PO CAPS: 10.0000 mg | ORAL_CAPSULE | Freq: Three times a day (TID) | ORAL | 11 refills | Status: DC

## 2017-07-05 NOTE — Progress Notes (Addendum)
Subjective:   Kaitlyn Adams is a 66 y.o. female who presents for Medicare Annual (Subsequent) preventive examination.  Owns heating/plumbing.   Review of Systems:  No ROS.  Medicare Wellness Visit. Additional risk factors are reflected in the social history.  Cardiac Risk Factors include: advanced age (>22men, >80 women);dyslipidemia;hypertension;family history of premature cardiovascular disease   Sleep patterns: Sleeps 6-7 hours, often does not feel rested.  Home Safety/Smoke Alarms: Feels safe in home. Smoke alarms in place.  Living environment; residence and Firearm Safety: Lives with husband in 2 story home.  Seat Belt Safety/Bike Helmet: Wears seat belt.   Female:   [id number]       Mammo-09/02/2015, benign. Prefers every 2 years.        Dexa scan-Pt reports 02/2017 (Dr. Chalmers Cater), Osteopenia.         CCS-Colonoscopy 06/01/2017 ([facility]), polyp. Recall 3-6 months. Followed by GI.      Objective:     Vitals: BP 118/70 (BP Location: Left Arm, Patient Position: Sitting, Cuff Size: Normal)   Pulse 69   Ht 5\' 4"  (1.626 m)   Wt 105 lb (47.6 kg)   SpO2 96%   BMI 18.02 kg/m   Body mass index is 18.02 kg/m.   Tobacco Social History   Tobacco Use  Smoking Status Never Smoker  Smokeless Tobacco Never Used     Counseling given: Not Answered   Past Medical History:  Diagnosis Date  . Anemia   . Asthma    - normal spirometry 2009 FEV1  >90% predicted.  - methacoline challenge neg 11/09  . Bronchitis    recurrent  . Cataract   . Chronic cough    - sinus CT 03-23-10 > neg.  - allergy profile 03-23-10 > nl, IgE 14.  - flutter valve rx 03-23-10  . Fibromyalgia   . GERD (gastroesophageal reflux disease)    exacerbating VCD  . Glaucoma   . Hyperlipidemia   . Hypertension   . Hyperthyroidism    with hot nodule.  - total thyroidectomy 11-05-08 benign -> subsequent hypothyroidism.  Marland Kitchen Hypokalemia   . Hypothyroidism   . Hypothyroidism    a. following thyroidectomy.  . Mild  CAD    a. LHC 04/2015 - mild nonobstructive CAD with 30% dLAD, 40% D1, 25% pLCx, 30% mRCA, 30% RPDA, normal EF >65% with normal LVEDP.  . Orthostatic hypotension   . Osteoarthritis   . Paroxysmal atrial fibrillation (HCC)   . Renal insufficiency   . Vasomotor rhinitis    exacerbates VCD  . Vocal cord dysfunction    proven on FOB 9/09   Past Surgical History:  Procedure Laterality Date  . ABDOMINAL HYSTERECTOMY  1980  . ABDOMINAL HYSTERECTOMY    . BASAL CELL CARCINOMA EXCISION  11/08  . CARDIAC CATHETERIZATION N/A 05/15/2015   Procedure: Left Heart Cath and Coronary Angiography;  Surgeon: Sherren Mocha, MD;  Location: [place] [facility];  Service: Cardiovascular;  Laterality: N/A;  . COLONOSCOPY N/A 04/27/2017   Dr Silverio Decamp for scant rectal bleeding and iron def anemia: Melanosis coli, lipomatous IC valve, left sided tics, non-bleeding hemorrhoid, 10 mm transverse polyp.    . ESOPHAGOGASTRODUODENOSCOPY N/A 04/27/2017   Mauri Pole, MD; [facility] ENDOSCOPY. for iron def anemia.  Monilial/candidial esophagitis. Small HH.    . EYE SURGERY    . scar tissue removal  1982  . THYROID SURGERY  2010  . THYROIDECTOMY  11-05-08   Family History  Problem Relation Age of Onset  .  Asthma Mother   . Heart disease Mother   . Emphysema Father   . Heart disease Father   . Asthma Grandchild    Social History   Substance and Sexual Activity  Sexual Activity Yes  . Birth control/protection: None    Outpatient Encounter Medications as of 07/06/2017  Medication Sig  . Acetaminophen (TYLENOL ARTHRITIS PAIN PO) Take 325 mg by mouth daily as needed (FOR MILD PAIN).   Marland Kitchen ALPRAZolam (XANAX) 0.5 MG tablet Take 1 tablet (0.5 mg total) by mouth 2 (two) times daily as needed for anxiety.  Marland Kitchen amitriptyline (ELAVIL) 50 MG tablet TAKE 1 TABLET(50 MG) BY MOUTH AT BEDTIME  . atorvastatin (LIPITOR) 20 MG tablet TAKE 1 TABLET(20 MG) BY MOUTH DAILY  . benzonatate (TESSALON) 200 MG capsule One three times daily  (Patient taking differently: Take 200 mg as needed by mouth. )  . dextromethorphan (DELSYM) 30 MG/5ML liquid Take by mouth as needed for cough.  . dicyclomine (BENTYL) 10 MG capsule Take 1 capsule (10 mg total) 3 (three) times daily before meals by mouth. For abdominal pain  . DIGOX 125 MCG tablet TAKE 1 TABLET BY MOUTH DAILY  . levothyroxine (SYNTHROID, LEVOTHROID) 75 MCG tablet Take 1 tablet (75 mcg total) by mouth daily.  . metoprolol succinate (TOPROL-XL) 25 MG 24 hr tablet TAKE 1 TABLET(25 MG) BY MOUTH DAILY  . pantoprazole (PROTONIX) 40 MG tablet Take 30- 60 min before your first and last meals of the day (Patient taking differently: 80 mg. Take 30- 60 min before your first and last meals of the day)  . Rivaroxaban (XARELTO) 15 MG TABS tablet Take 1 tablet (15 mg total) by mouth daily with supper.  Marland Kitchen spironolactone (ALDACTONE) 25 MG tablet TAKE 1/2 TABLET(12.5 MG) BY MOUTH DAILY  . Vitamin D, Ergocalciferol, (DRISDOL) 50000 units CAPS capsule Take 50,000 Units by mouth every 7 (seven) days.  . Zoster Vaccine Adjuvanted [facility]) injection Inject 0.5 mLs once for 1 dose into the muscle.   No facility-administered encounter medications on file as of 07/06/2017.     Activities of Daily Living In your present state of health, do you have any difficulty performing the following activities: 07/06/2017 04/26/2017  Hearing? N N  Vision? N N  Difficulty concentrating or making decisions? Y N  Walking or climbing stairs? N N  Dressing or bathing? N N  Doing errands, shopping? N N  Preparing Food and eating ? N -  Using the Toilet? N -  In the past six months, have you accidently leaked urine? N -  Do you have problems with loss of bowel control? N -  Managing your Medications? N -  Managing your Finances? N -  Housekeeping or managing your Housekeeping? N -  Some recent data might be hidden    Patient Care Team: Midge Minium, MD as PCP - General (Family Medicine) Jacelyn Pi,  MD as Consulting Physician (Endocrinology) Jola Schmidt, MD as Consulting Physician (Ophthalmology) Martinique, Peter M, MD as Consulting Physician (Cardiology) Tanda Rockers, MD as Consulting Physician (Pulmonary Disease)    Assessment:    Physical assessment deferred to PCP.  Exercise Activities and Dietary recommendations Current Exercise Habits: The patient does not participate in regular exercise at present(Up/down at work), Exercise limited by: None identified   Diet (meal preparation, eat out, water intake, caffeinated beverages, dairy products, fruits and vegetables): Drinks water and diet soda.   Eats heart healthy diet.    Goals    None  Goal: Stay active.  Fall Risk Fall Risk  07/06/2017 11/25/2016 11/18/2015 07/30/2015  Falls in the past year? Yes No No Yes  Number falls in past yr: 2 or more - - 1  Injury with Fall? No - - No  Follow up Falls prevention discussed - - -   Depression Screen PHQ 2/9 Scores 07/06/2017 03/21/2017 11/25/2016 11/18/2015  PHQ - 2 Score 0 0 0 0  PHQ- 9 Score 0 0 - -     Cognitive Function MMSE - Mini Mental State Exam 07/06/2017  Orientation to time 5  Orientation to Place 5  Registration 3  Attention/ Calculation 5  Recall 3  Language- name 2 objects 2  Language- repeat 1  Language- follow 3 step command 3  Language- read & follow direction 1  Write a sentence 1  Copy design 1  Total score 30        Immunization History  Administered Date(s) Administered  . Influenza Whole 06/22/2009  . Influenza, High Dose Seasonal PF 07/06/2017  . Pneumococcal Conjugate-13 11/18/2015  . Pneumococcal Polysaccharide-23 08/23/2003, 07/06/2017  . Tdap 07/10/2012   Screening Tests Health Maintenance  Topic Date Due  . MAMMOGRAM  08/22/2017 (Originally 09/01/2016)  . Hepatitis C Screening  07/06/2018 (Originally 05-Feb-1951)  . COLONOSCOPY  06/01/2018  . DEXA SCAN  02/20/2019  . TETANUS/TDAP  07/10/2022  . INFLUENZA VACCINE  Completed  .  PNA vac Low Risk Adult  Completed      Plan:     Shingles vaccine at pharmacy.   Bring a copy of your living will and/or healthcare power of attorney to your next office visit.  Continue doing brain stimulating activities (puzzles, reading, adult coloring books, staying active) to keep memory sharp.   I have personally reviewed and noted the following in the patient's chart:   . Medical and social history . Use of alcohol, tobacco or illicit drugs  . Current medications and supplements . Functional ability and status . Nutritional status . Physical activity . Advanced directives . List of other physicians . Hospitalizations, surgeries, and ER visits in previous 12 months . Vitals . Screenings to include cognitive, depression, and falls . Referrals and appointments  In addition, I have reviewed and discussed with patient certain preventive protocols, quality metrics, and best practice recommendations. A written personalized care plan for preventive services as well as general preventive health recommendations were provided to patient.     Gerilyn Nestle, RN  07/06/2017  Reviewed documentation and agree w/ above.  Annye Asa, MD

## 2017-07-05 NOTE — Progress Notes (Signed)
    History of Present Illness: This is a 66 year old female with iron deficiency anemia and heme + stool.  She has undergone extensive evaluation without a clear cause for iron deficiency anemia however the small bowel erosion was a potential source of blood loss.  Her most recent hemoglobin had increased to 12.1. She relates intermittent abdominal bloating associated with left lower quadrant pain.  She states the symptoms started after her colonoscopy prep in September.  Symptoms generally start after a meal.  Colonoscopy at [facility] 05/2017 18 mm sessile IC valve polyp removed piecemeal  Follow up at [facility] in 3-6 months  Capsule endoscopy 04/2017:  single linear erosion, melanosis coli, otherwise negative.   Colonoscopy 04/2017 - Melanosis in the colon. - Lipomatous ileocecal valve. Biopsied. - One 10 mm polyp in the transverse colon, removed with a hot snare. Resected and retrieved. - Diverticulosis in the sigmoid colon and in the descending colon. - Non-bleeding internal hemorrhoids. - The examination was otherwise normal.  EGD 04/2017 - Mildly severe monilial and candidiasis esophagitis. Biopsied. - Small hiatal hernia. - Normal examined duodenum.   Current Medications, Allergies, Past Medical History, Past Surgical History, Family History and Social History were reviewed in [employer] record.  Physical Exam: General: Well developed, well nourished, no acute distress Head: Normocephalic and atraumatic Eyes:  sclerae anicteric, EOMI Ears: Normal auditory acuity Mouth: No deformity or lesions Lungs: Clear throughout to auscultation Heart: Regular rate and rhythm; no murmurs, rubs or bruits Abdomen: Soft, non tender and non distended. No masses, hepatosplenomegaly or hernias noted. Normal Bowel sounds Musculoskeletal: Symmetrical with no gross deformities  Pulses:  Normal pulses noted Extremities: No clubbing, cyanosis, edema or deformities noted Neurological:  Alert oriented x 4, grossly nonfocal Psychological:  Alert and cooperative. Normal mood and affect  Assessment and Recommendations:  1.  IDA and heme + stool.  We reviewed her entire workup to date.  Hb=12.1. Potential source of small bowel erosion.  As she is maintained on Xarelto she may have ongoing blood loss and iron replacement could be intermittently necessary per her PCP.  Follow-up with PCP.  2.  Gas, bloating, LLQ pain.  Patient information on gas and bloating from Up-to-Date provided.  Gas-X 3 times daily as needed.  Dicyclomine 10 mg 3 times daily as needed.  Trial of at least 3 different probiotics for 2 weeks each to determine if they positively impact symptoms.  3.  History of adenomatous colon polyps. Piecemeal polypectomy at [facility] with plans for repeat colonoscopy at [facility] in 3-6 months. Surveillance colonoscopy here in 05/2020 unless there is a different recommendation following her next colonoscopy at [facility].

## 2017-07-05 NOTE — Patient Instructions (Signed)
We have sent the following medications to your pharmacy for you to pick up at your convenience: Bentyl.  Start over the counter Gas-x four times a day as needed for gas and bloating.  We have given you a handout on gas and bloating.   If your symptoms do not improve with the above measures then start a probiotic. You can take Align, [employer] or [employer] colon health each for 2 weeks to see which one helps your symptoms.   You will be due for a recall colonoscopy in 05/2020. We will send you a reminder in the mail when it gets closer to that time.  Normal BMI (Body Mass Index- based on height and weight) is between 23 and 30. Your BMI today is Body mass index is 18.67 kg/m. Marland Kitchen Please consider follow up  regarding your BMI with your Primary Care Provider.  Thank you for choosing me and [facility].  Pricilla Riffle. Dagoberto Ligas., MD., Marval Regal

## 2017-07-06 ENCOUNTER — Other Ambulatory Visit: Payer: Self-pay

## 2017-07-06 ENCOUNTER — Ambulatory Visit (INDEPENDENT_AMBULATORY_CARE_PROVIDER_SITE_OTHER): Payer: Medicare Other

## 2017-07-06 ENCOUNTER — Encounter: Payer: Self-pay | Admitting: Family Medicine

## 2017-07-06 DIAGNOSIS — Z23 Encounter for immunization: Secondary | ICD-10-CM

## 2017-07-06 DIAGNOSIS — Z0000 Encounter for general adult medical examination without abnormal findings: Secondary | ICD-10-CM

## 2017-07-06 MED ORDER — ZOSTER VAC RECOMB ADJUVANTED 50 MCG/0.5ML IM SUSR: 0.5000 mL | Freq: Once | INTRAMUSCULAR | 1 refills | Status: AC

## 2017-07-06 NOTE — Patient Instructions (Addendum)
Shingles vaccine at pharmacy.   Bring a copy of your living will and/or healthcare power of attorney to your next office visit.  Continue doing brain stimulating activities (puzzles, reading, adult coloring books, staying active) to keep memory sharp.   Fall Prevention in the Home Falls can cause injuries. They can happen to people of all ages. There are many things you can do to make your home safe and to help prevent falls. What can I do on the outside of my home?  Regularly fix the edges of walkways and driveways and fix any cracks.  Remove anything that might make you trip as you walk through a door, such as a raised step or threshold.  Trim any bushes or trees on the path to your home.  Use bright outdoor lighting.  Clear any walking paths of anything that might make someone trip, such as rocks or tools.  Regularly check to see if handrails are loose or broken. Make sure that both sides of any steps have handrails.  Any raised decks and porches should have guardrails on the edges.  Have any leaves, snow, or ice cleared regularly.  Use sand or salt on walking paths during winter.  Clean up any spills in your garage right away. This includes oil or grease spills. What can I do in the bathroom?  Use night lights.  Install grab bars by the toilet and in the tub and shower. Do not use towel bars as grab bars.  Use non-skid mats or decals in the tub or shower.  If you need to sit down in the shower, use a plastic, non-slip stool.  Keep the floor dry. Clean up any water that spills on the floor as soon as it happens.  Remove soap buildup in the tub or shower regularly.  Attach bath mats securely with double-sided non-slip rug tape.  Do not have throw rugs and other things on the floor that can make you trip. What can I do in the bedroom?  Use night lights.  Make sure that you have a light by your bed that is easy to reach.  Do not use any sheets or blankets that are  too big for your bed. They should not hang down onto the floor.  Have a firm chair that has side arms. You can use this for support while you get dressed.  Do not have throw rugs and other things on the floor that can make you trip. What can I do in the kitchen?  Clean up any spills right away.  Avoid walking on wet floors.  Keep items that you use a lot in easy-to-reach places.  If you need to reach something above you, use a strong step stool that has a grab bar.  Keep electrical cords out of the way.  Do not use floor polish or wax that makes floors slippery. If you must use wax, use non-skid floor wax.  Do not have throw rugs and other things on the floor that can make you trip. What can I do with my stairs?  Do not leave any items on the stairs.  Make sure that there are handrails on both sides of the stairs and use them. Fix handrails that are broken or loose. Make sure that handrails are as long as the stairways.  Check any carpeting to make sure that it is firmly attached to the stairs. Fix any carpet that is loose or worn.  Avoid having throw rugs at the top or   bottom of the stairs. If you do have throw rugs, attach them to the floor with carpet tape.  Make sure that you have a light switch at the top of the stairs and the bottom of the stairs. If you do not have them, ask someone to add them for you. What else can I do to help prevent falls?  Wear shoes that: ? Do not have high heels. ? Have rubber bottoms. ? Are comfortable and fit you well. ? Are closed at the toe. Do not wear sandals.  If you use a stepladder: ? Make sure that it is fully opened. Do not climb a closed stepladder. ? Make sure that both sides of the stepladder are locked into place. ? Ask someone to hold it for you, if possible.  Clearly mark and make sure that you can see: ? Any grab bars or handrails. ? First and last steps. ? Where the edge of each step is.  Use tools that help you move  around (mobility aids) if they are needed. These include: ? Canes. ? Walkers. ? Scooters. ? Crutches.  Turn on the lights when you go into a dark area. Replace any light bulbs as soon as they burn out.  Set up your furniture so you have a clear path. Avoid moving your furniture around.  If any of your floors are uneven, fix them.  If there are any pets around you, be aware of where they are.  Review your medicines with your doctor. Some medicines can make you feel dizzy. This can increase your chance of falling. Ask your doctor what other things that you can do to help prevent falls. This information is not intended to replace advice given to you by your health care provider. Make sure you discuss any questions you have with your health care provider. Document Released: 06/04/2009 Document Revised: 01/14/2016 Document Reviewed: 09/12/2014 Elsevier Interactive Patient Education  2018 [employer] Maintenance, Female Adopting a healthy lifestyle and getting preventive care can go a long way to promote health and wellness. Talk with your health care provider about what schedule of regular examinations is right for you. This is a good chance for you to check in with your provider about disease prevention and staying healthy. In between checkups, there are plenty of things you can do on your own. Experts have done a lot of research about which lifestyle changes and preventive measures are most likely to keep you healthy. Ask your health care provider for more information. Weight and diet Eat a healthy diet  Be sure to include plenty of vegetables, fruits, low-fat dairy products, and lean protein.  Do not eat a lot of foods high in solid fats, added sugars, or salt.  Get regular exercise. This is one of the most important things you can do for your health. ? Most adults should exercise for at least 150 minutes each week. The exercise should increase your heart rate and make you  sweat (moderate-intensity exercise). ? Most adults should also do strengthening exercises at least twice a week. This is in addition to the moderate-intensity exercise.  Maintain a healthy weight  Body mass index (BMI) is a measurement that can be used to identify possible weight problems. It estimates body fat based on height and weight. Your health care provider can help determine your BMI and help you achieve or maintain a healthy weight.  For females 75 years of age and older: ? A BMI below 18.5 is considered  underweight. ? A BMI of 18.5 to 24.9 is normal. ? A BMI of 25 to 29.9 is considered overweight. ? A BMI of 30 and above is considered obese.  Watch levels of cholesterol and blood lipids  You should start having your blood tested for lipids and cholesterol at 67 years of age, then have this test every 5 years.  You may need to have your cholesterol levels checked more often if: ? Your lipid or cholesterol levels are high. ? You are older than 67 years of age. ? You are at high risk for heart disease.  Cancer screening Lung Cancer  Lung cancer screening is recommended for adults 55-80 years old who are at high risk for lung cancer because of a history of smoking.  A yearly low-dose CT scan of the lungs is recommended for people who: ? Currently smoke. ? Have quit within the past 15 years. ? Have at least a 30-pack-year history of smoking. A pack year is smoking an average of one pack of cigarettes a day for 1 year.  Yearly screening should continue until it has been 15 years since you quit.  Yearly screening should stop if you develop a health problem that would prevent you from having lung cancer treatment.  Breast Cancer  Practice breast self-awareness. This means understanding how your breasts normally appear and feel.  It also means doing regular breast self-exams. Let your health care provider know about any changes, no matter how small.  If you are in your 20s  or 30s, you should have a clinical breast exam (CBE) by a health care provider every 1-3 years as part of a regular health exam.  If you are 40 or older, have a CBE every year. Also consider having a breast X-ray (mammogram) every year.  If you have a family history of breast cancer, talk to your health care provider about genetic screening.  If you are at high risk for breast cancer, talk to your health care provider about having an MRI and a mammogram every year.  Breast cancer gene (BRCA) assessment is recommended for women who have family members with BRCA-related cancers. BRCA-related cancers include: ? Breast. ? Ovarian. ? Tubal. ? Peritoneal cancers.  Results of the assessment will determine the need for genetic counseling and BRCA1 and BRCA2 testing.  Cervical Cancer Your health care provider may recommend that you be screened regularly for cancer of the pelvic organs (ovaries, uterus, and vagina). This screening involves a pelvic examination, including checking for microscopic changes to the surface of your cervix (Pap test). You may be encouraged to have this screening done every 3 years, beginning at age 21.  For women ages 30-65, health care providers may recommend pelvic exams and Pap testing every 3 years, or they may recommend the Pap and pelvic exam, combined with testing for human papilloma virus (HPV), every 5 years. Some types of HPV increase your risk of cervical cancer. Testing for HPV may also be done on women of any age with unclear Pap test results.  Other health care providers may not recommend any screening for nonpregnant women who are considered low risk for pelvic cancer and who do not have symptoms. Ask your health care provider if a screening pelvic exam is right for you.  If you have had past treatment for cervical cancer or a condition that could lead to cancer, you need Pap tests and screening for cancer for at least 20 years after your treatment. If Pap tests    have been discontinued, your risk factors (such as having a new sexual partner) need to be reassessed to determine if screening should resume. Some women have medical problems that increase the chance of getting cervical cancer. In these cases, your health care provider may recommend more frequent screening and Pap tests.  Colorectal Cancer  This type of cancer can be detected and often prevented.  Routine colorectal cancer screening usually begins at 67 years of age and continues through 67 years of age.  Your health care provider may recommend screening at an earlier age if you have risk factors for colon cancer.  Your health care provider may also recommend using home test kits to check for hidden blood in the stool.  A small camera at the end of a tube can be used to examine your colon directly (sigmoidoscopy or colonoscopy). This is done to check for the earliest forms of colorectal cancer.  Routine screening usually begins at age 57.  Direct examination of the colon should be repeated every 5-10 years through 67 years of age. However, you may need to be screened more often if early forms of precancerous polyps or small growths are found.  Skin Cancer  Check your skin from head to toe regularly.  Tell your health care provider about any new moles or changes in moles, especially if there is a change in a mole's shape or color.  Also tell your health care provider if you have a mole that is larger than the size of a pencil eraser.  Always use sunscreen. Apply sunscreen liberally and repeatedly throughout the day.  Protect yourself by wearing long sleeves, pants, a wide-brimmed hat, and sunglasses whenever you are outside.  Heart disease, diabetes, and high blood pressure  High blood pressure causes heart disease and increases the risk of stroke. High blood pressure is more likely to develop in: ? People who have blood pressure in the high end of the normal range (130-139/85-89 mm  Hg). ? People who are overweight or obese. ? People who are African American.  If you are 6-54 years of age, have your blood pressure checked every 3-5 years. If you are 64 years of age or older, have your blood pressure checked every year. You should have your blood pressure measured twice-once when you are at a hospital or clinic, and once when you are not at a hospital or clinic. Record the average of the two measurements. To check your blood pressure when you are not at a hospital or clinic, you can use: ? An automated blood pressure machine at a pharmacy. ? A home blood pressure monitor.  If you are between 93 years and 82 years old, ask your health care provider if you should take aspirin to prevent strokes.  Have regular diabetes screenings. This involves taking a blood sample to check your fasting blood sugar level. ? If you are at a normal weight and have a low risk for diabetes, have this test once every three years after 67 years of age. ? If you are overweight and have a high risk for diabetes, consider being tested at a younger age or more often. Preventing infection Hepatitis B  If you have a higher risk for hepatitis B, you should be screened for this virus. You are considered at high risk for hepatitis B if: ? You were born in a country where hepatitis B is common. Ask your health care provider which countries are considered high risk. ? Your parents were born  in a high-risk country, and you have not been immunized against hepatitis B (hepatitis B vaccine). ? You have HIV or AIDS. ? You use needles to inject street drugs. ? You live with someone who has hepatitis B. ? You have had sex with someone who has hepatitis B. ? You get hemodialysis treatment. ? You take certain medicines for conditions, including cancer, organ transplantation, and autoimmune conditions.  Hepatitis C  Blood testing is recommended for: ? Everyone born from [birth year] through 1966. ? Anyone with known  risk factors for hepatitis C.  Sexually transmitted infections (STIs)  You should be screened for sexually transmitted infections (STIs) including gonorrhea and chlamydia if: ? You are sexually active and are younger than 67 years of age. ? You are older than 67 years of age and your health care provider tells you that you are at risk for this type of infection. ? Your sexual activity has changed since you were last screened and you are at an increased risk for chlamydia or gonorrhea. Ask your health care provider if you are at risk.  If you do not have HIV, but are at risk, it may be recommended that you take a prescription medicine daily to prevent HIV infection. This is called pre-exposure prophylaxis (PrEP). You are considered at risk if: ? You are sexually active and do not regularly use condoms or know the HIV status of your partner(s). ? You take drugs by injection. ? You are sexually active with a partner who has HIV.  Talk with your health care provider about whether you are at high risk of being infected with HIV. If you choose to begin PrEP, you should first be tested for HIV. You should then be tested every 3 months for as long as you are taking PrEP. Pregnancy  If you are premenopausal and you may become pregnant, ask your health care provider about preconception counseling.  If you may become pregnant, take 400 to 800 micrograms (mcg) of folic acid every day.  If you want to prevent pregnancy, talk to your health care provider about birth control (contraception). Osteoporosis and menopause  Osteoporosis is a disease in which the bones lose minerals and strength with aging. This can result in serious bone fractures. Your risk for osteoporosis can be identified using a bone density scan.  If you are 29 years of age or older, or if you are at risk for osteoporosis and fractures, ask your health care provider if you should be screened.  Ask your health care provider whether you  should take a calcium or vitamin D supplement to lower your risk for osteoporosis.  Menopause may have certain physical symptoms and risks.  Hormone replacement therapy may reduce some of these symptoms and risks. Talk to your health care provider about whether hormone replacement therapy is right for you. Follow these instructions at home:  Schedule regular health, dental, and eye exams.  Stay current with your immunizations.  Do not use any tobacco products including cigarettes, chewing tobacco, or electronic cigarettes.  If you are pregnant, do not drink alcohol.  If you are breastfeeding, limit how much and how often you drink alcohol.  Limit alcohol intake to no more than 1 drink per day for nonpregnant women. One drink equals 12 ounces of beer, 5 ounces of wine, or 1 ounces of hard liquor.  Do not use street drugs.  Do not share needles.  Ask your health care provider for help if you need support or information  about quitting drugs.  Tell your health care provider if you often feel depressed.  Tell your health care provider if you have ever been abused or do not feel safe at home. This information is not intended to replace advice given to you by your health care provider. Make sure you discuss any questions you have with your health care provider. Document Released: 02/21/2011 Document Revised: 01/14/2016 Document Reviewed: 05/12/2015 Elsevier Interactive Patient Education  [employer].

## 2017-07-15 ENCOUNTER — Other Ambulatory Visit: Payer: Self-pay | Admitting: Cardiology

## 2017-07-16 ENCOUNTER — Other Ambulatory Visit: Payer: Self-pay | Admitting: Family Medicine

## 2017-07-16 DIAGNOSIS — E876 Hypokalemia: Secondary | ICD-10-CM

## 2017-08-28 DIAGNOSIS — H40013 Open angle with borderline findings, low risk, bilateral: Secondary | ICD-10-CM | POA: Diagnosis not present

## 2017-09-05 ENCOUNTER — Encounter: Payer: Self-pay | Admitting: Family Medicine

## 2017-09-05 ENCOUNTER — Other Ambulatory Visit: Payer: Self-pay

## 2017-09-05 ENCOUNTER — Ambulatory Visit (INDEPENDENT_AMBULATORY_CARE_PROVIDER_SITE_OTHER): Payer: Medicare Other | Admitting: Family Medicine

## 2017-09-05 VITALS — BP 110/80 | HR 73 | Temp 98.1°F | Resp 16 | Ht 64.0 in | Wt 104.5 lb

## 2017-09-05 DIAGNOSIS — I10 Essential (primary) hypertension: Secondary | ICD-10-CM | POA: Diagnosis not present

## 2017-09-05 DIAGNOSIS — E039 Hypothyroidism, unspecified: Secondary | ICD-10-CM

## 2017-09-05 DIAGNOSIS — B9689 Other specified bacterial agents as the cause of diseases classified elsewhere: Secondary | ICD-10-CM

## 2017-09-05 DIAGNOSIS — J329 Chronic sinusitis, unspecified: Secondary | ICD-10-CM | POA: Diagnosis not present

## 2017-09-05 DIAGNOSIS — E7800 Pure hypercholesterolemia, unspecified: Secondary | ICD-10-CM

## 2017-09-05 LAB — HEPATIC FUNCTION PANEL
ALBUMIN: 4.7 g/dL (ref 3.5–5.2)
ALT: 17 U/L (ref 0–35)
AST: 17 U/L (ref 0–37)
Alkaline Phosphatase: 90 U/L (ref 39–117)
Bilirubin, Direct: 0.1 mg/dL (ref 0.0–0.3)
Total Bilirubin: 0.6 mg/dL (ref 0.2–1.2)
Total Protein: 7.5 g/dL (ref 6.0–8.3)

## 2017-09-05 LAB — CBC WITH DIFFERENTIAL/PLATELET
BASOS ABS: 0.1 10*3/uL (ref 0.0–0.1)
Basophils Relative: 0.7 % (ref 0.0–3.0)
EOS ABS: 0.1 10*3/uL (ref 0.0–0.7)
EOS PCT: 0.9 % (ref 0.0–5.0)
HEMATOCRIT: 45.8 % (ref 36.0–46.0)
Hemoglobin: 15 g/dL (ref 12.0–15.0)
LYMPHS PCT: 19.3 % (ref 12.0–46.0)
Lymphs Abs: 1.7 10*3/uL (ref 0.7–4.0)
MCHC: 32.8 g/dL (ref 30.0–36.0)
MCV: 81.1 fl (ref 78.0–100.0)
MONO ABS: 0.5 10*3/uL (ref 0.1–1.0)
MONOS PCT: 5.8 % (ref 3.0–12.0)
NEUTROS PCT: 73.3 % (ref 43.0–77.0)
Neutro Abs: 6.4 10*3/uL (ref 1.4–7.7)
PLATELETS: 307 10*3/uL (ref 150.0–400.0)
RBC: 5.65 Mil/uL — ABNORMAL HIGH (ref 3.87–5.11)
RDW: 14.4 % (ref 11.5–15.5)
WBC: 8.7 10*3/uL (ref 4.0–10.5)

## 2017-09-05 LAB — BASIC METABOLIC PANEL
BUN: 12 mg/dL (ref 6–23)
CO2: 26 mEq/L (ref 19–32)
CREATININE: 1.06 mg/dL (ref 0.40–1.20)
Calcium: 10.5 mg/dL (ref 8.4–10.5)
Chloride: 105 mEq/L (ref 96–112)
GFR: 54.79 mL/min — AB (ref >60.00)
Glucose, Bld: 98 mg/dL (ref 70–99)
Potassium: 4.1 mEq/L (ref 3.5–5.1)
Sodium: 141 mEq/L (ref 135–145)

## 2017-09-05 LAB — TSH: TSH: 0.45 u[IU]/mL (ref 0.35–4.50)

## 2017-09-05 LAB — LIPID PANEL
Cholesterol: 182 mg/dL (ref 0–200)
HDL: 41.2 mg/dL (ref >39.00)
NonHDL: 140.36
Total CHOL/HDL Ratio: 4
Triglycerides: 330 mg/dL — ABNORMAL HIGH (ref 0.0–149.0)
VLDL: 66 mg/dL — AB (ref 0.0–40.0)

## 2017-09-05 LAB — LDL CHOLESTEROL, DIRECT: Direct LDL: 93 mg/dL

## 2017-09-05 MED ORDER — AMOXICILLIN 875 MG PO TABS: 875.0000 mg | ORAL_TABLET | Freq: Two times a day (BID) | ORAL | 0 refills | Status: DC

## 2017-09-05 NOTE — Patient Instructions (Signed)
Follow up in 6 months to recheck BP and cholesterol We'll notify you of your lab results and make any changes if needed Start the Amoxicillin twice daily for your sinus infection- take w/ food Drink plenty of fluids REST! Call with any questions or concerns Happy Early Birthday!!!

## 2017-09-05 NOTE — Assessment & Plan Note (Signed)
Chronic problem.  Following w/ Dr Chalmers Cater.  Pt reports fatigue but this may be due to her anemia.  Check labs and forward to Dr Chalmers Cater if abnormal.

## 2017-09-05 NOTE — Progress Notes (Signed)
   Subjective:    Patient ID: Kaitlyn Adams, female    DOB: 1950-11-22, 67 y.o.   MRN: [id number]  HPI Hyperlipidemia- chronic problem, on Lipitor.  No abd pain, N/V.  Hypothyroid- chronic problem, on Levothyroxine 32mcg daily.  + fatigue.  HTN- chronic problem, on Metoprolol, Spironolactone w/ good control.  No CP.  + SOB.  No HAs w/ exception of current illness.  No edema.  Sinus congestion- no fevers.  + cough.  + PND.  + frontal HA and sinus pain.  No tooth pain.  Has been feeling poorly for 'awhile' but the drainage worsened yesterday and today.   Review of Systems For ROS see HPI     Objective:   Physical Exam  Constitutional: She is oriented to person, place, and time. She appears well-developed and well-nourished. No distress.  HENT:  Head: Normocephalic and atraumatic.  Right Ear: Tympanic membrane normal.  Left Ear: Tympanic membrane normal.  Nose: Mucosal edema and rhinorrhea present. Right sinus exhibits maxillary sinus tenderness and frontal sinus tenderness. Left sinus exhibits maxillary sinus tenderness and frontal sinus tenderness.  Mouth/Throat: Uvula is midline and mucous membranes are normal. Posterior oropharyngeal erythema present. No oropharyngeal exudate.  Eyes: Conjunctivae and EOM are normal. Pupils are equal, round, and reactive to light.  Neck: Normal range of motion. Neck supple.  Cardiovascular: Normal rate, regular rhythm, normal heart sounds and intact distal pulses.  Pulmonary/Chest: Effort normal and breath sounds normal. No respiratory distress. She has no wheezes.  Abdominal: Soft. Bowel sounds are normal. She exhibits no distension. There is no tenderness. There is no rebound and no guarding.  Lymphadenopathy:    She has no cervical adenopathy.  Neurological: She is alert and oriented to person, place, and time.  Psychiatric: She has a normal mood and affect. Her behavior is normal. Thought content normal.  Vitals reviewed.           Assessment & Plan:  Acute sinusitis- new.  Pt's sxs and PE consistent w/ infxn.  Start abx.  Reviewed supportive care and red flags that should prompt return.  Pt expressed understanding and is in agreement w/ plan.

## 2017-09-05 NOTE — Assessment & Plan Note (Signed)
Chronic problem.  Tolerating statin w/o difficulty.  Check labs.  Adjust meds prn  

## 2017-09-05 NOTE — Assessment & Plan Note (Signed)
Chronic problem.  Excellent control.  Asymptomatic w/ exception of SOB which is likely related to her anemia.  Check labs.  No anticipated med changes.  Will follow.

## 2017-09-06 ENCOUNTER — Other Ambulatory Visit: Payer: Self-pay | Admitting: General Practice

## 2017-09-06 ENCOUNTER — Telehealth: Payer: Self-pay | Admitting: Family Medicine

## 2017-09-06 MED ORDER — FENOFIBRATE 160 MG PO TABS: 160.0000 mg | ORAL_TABLET | Freq: Every day | ORAL | 6 refills | Status: DC

## 2017-09-06 NOTE — Telephone Encounter (Signed)
Patient wants to let provider know that she received provider message and she was fasting when she had her labs drawn. She is willing to take the Fenofibrate. Please call to [facility]/[place].

## 2017-09-06 NOTE — Telephone Encounter (Signed)
Medication filled to local pharmacy as requested.  

## 2017-09-11 NOTE — Progress Notes (Signed)
Cardiology Office Note Date:  09/11/2017  Patient ID:  Kaitlyn, Adams [birth year], MRN [id number] PCP:  Kaitlyn Minium, MD  Cardiologist:  Dr. Martinique   Chief Complaint: Pafib  History of Present Illness: Kaitlyn Adams is a 67 y.o. female with history of paroxysmal atrial fibrillation, HTN, HLD, hyperthyroidism s/p thyroidectomy with subsequent hypothyroidism, asthma, GERD, fibromyalgia,  mild nonobstructive CAD who presents for follow-up.  She was admitted 9/22-9/23/16 with complaints of chest discomfort as well as symptoms of recurrent atrial fibrillation including dyspnea, weakness/fatigue, and palpitations with near-syncope. Labs were notable for significant hypokalemia of 2.5 which was repleted.  Troponins were negative. She underwent cath showing mild nonobstructive CAD with 30% dLAD, 40% D1, 25% pLCx, 30% mRCA, 30% RPDA, normal EF >65% with normal LVEDP.  Lasix was stopped due to her hypokalemia. Labs otherwise notable for LDL 140. She had a 30-day event monitor which showed some episodes of PAT. No definite AFib. She was started on Xarelto for [facility] of 3-4 (HTN, age, female, mild vascular disease). She was also started on atorvastatin. 2D echo 05/15/15: EF 06-23%, normal diastolic function. F/u labs 9/26 showed digoxin level 1.1 (digoxin decreased to 0.125mg  daily).  She was admitted in September 2018 with severe anemia and heme positive stool. Hgb down to 5.3. She was transfused. [facility] consulted and performed EGD and colonoscopy 9/6. EGD: Mildly severe Candida esophagitis, small hiatal hernia and normal examined duodenum. Colonoscopy: Melanosis, lipomatous ileocecal valve biopsied, 10 mm polyp in transverse colon resected, diverticulosis of sigmoid and descending colon, nonbleeding internal hemorrhoids. She had capsule endoscopy as an outpatient. This showed one small linear erosion. On recent lab work Hgb improved to 15.   On follow up she reports symptoms of  chronic fatigue  Wakes up tired in the morning. States she works all day without any breaks and is exhausted by the end of the day.  Notes heart is irregular at times but really fairly minor.  No edema. No chest pain.   Past Medical History:  Diagnosis Date  . Anemia   . Asthma    - normal spirometry 2009 FEV1  >90% predicted.  - methacoline challenge neg 11/09  . Bronchitis    recurrent  . Cataract   . Chronic cough    - sinus CT 03-23-10 > neg.  - allergy profile 03-23-10 > nl, IgE 14.  - flutter valve rx 03-23-10  . Fibromyalgia   . GERD (gastroesophageal reflux disease)    exacerbating VCD  . Glaucoma   . Hyperlipidemia   . Hypertension   . Hyperthyroidism    with hot nodule.  - total thyroidectomy 11-05-08 benign -> subsequent hypothyroidism.  Marland Kitchen Hypokalemia   . Hypothyroidism   . Hypothyroidism    a. following thyroidectomy.  . Mild CAD    a. LHC 04/2015 - mild nonobstructive CAD with 30% dLAD, 40% D1, 25% pLCx, 30% mRCA, 30% RPDA, normal EF >65% with normal LVEDP.  . Orthostatic hypotension   . Osteoarthritis   . Paroxysmal atrial fibrillation (HCC)   . Renal insufficiency   . Vasomotor rhinitis    exacerbates VCD  . Vocal cord dysfunction    proven on FOB 9/09    Past Surgical History:  Procedure Laterality Date  . ABDOMINAL HYSTERECTOMY  1980  . ABDOMINAL HYSTERECTOMY    . BASAL CELL CARCINOMA EXCISION  11/08  . CARDIAC CATHETERIZATION N/A 05/15/2015   Procedure: Left Heart Cath and Coronary Angiography;  Surgeon: Sherren Mocha, MD;  Location: [place] [facility];  Service: Cardiovascular;  Laterality: N/A;  . COLONOSCOPY N/A 04/27/2017   Dr Silverio Decamp for scant rectal bleeding and iron def anemia: Melanosis coli, lipomatous IC valve, left sided tics, non-bleeding hemorrhoid, 10 mm transverse polyp.    . ESOPHAGOGASTRODUODENOSCOPY N/A 04/27/2017   Kaitlyn Pole, MD; [facility] ENDOSCOPY. for iron def anemia.  Monilial/candidial esophagitis. Small HH.    . EYE SURGERY    . scar  tissue removal  1982  . THYROID SURGERY  2010  . THYROIDECTOMY  11-05-08    Current Outpatient Medications  Medication Sig Dispense Refill  . Acetaminophen (TYLENOL ARTHRITIS PAIN PO) Take 325 mg by mouth daily as needed (FOR MILD PAIN).     Marland Kitchen ALPRAZolam (XANAX) 0.5 MG tablet Take 1 tablet (0.5 mg total) by mouth 2 (two) times daily as needed for anxiety. 60 tablet 3  . amitriptyline (ELAVIL) 50 MG tablet TAKE 1 TABLET(50 MG) BY MOUTH AT BEDTIME 90 tablet 0  . amoxicillin (AMOXIL) 875 MG tablet Take 1 tablet (875 mg total) by mouth 2 (two) times daily. 20 tablet 0  . atorvastatin (LIPITOR) 20 MG tablet TAKE 1 TABLET(20 MG) BY MOUTH DAILY 90 tablet 0  . dicyclomine (BENTYL) 10 MG capsule Take 1 capsule (10 mg total) 3 (three) times daily before meals by mouth. For abdominal pain 90 capsule 11  . DIGOX 125 MCG tablet TAKE 1 TABLET BY MOUTH DAILY 90 tablet 1  . fenofibrate 160 MG tablet Take 1 tablet (160 mg total) by mouth daily. 30 tablet 6  . levothyroxine (SYNTHROID, LEVOTHROID) 75 MCG tablet Take 1 tablet (75 mcg total) by mouth daily. 90 tablet 1  . metoprolol succinate (TOPROL-XL) 25 MG 24 hr tablet TAKE 1 TABLET(25 MG) BY MOUTH DAILY 90 tablet 0  . pantoprazole (PROTONIX) 40 MG tablet Take 30- 60 min before your first and last meals of the day (Patient taking differently: 80 mg. Take 30- 60 min before your first and last meals of the day) 60 tablet 3  . spironolactone (ALDACTONE) 25 MG tablet TAKE 1/2 TABLET(12.5 MG) BY MOUTH DAILY 45 tablet 0  . XARELTO 15 MG TABS tablet TAKE 1 TABLET(15 MG) BY MOUTH DAILY WITH SUPPER 90 tablet 0   No current facility-administered medications for this visit.     Allergies:   Codeine; Erythromycin; Guaifenesin er; Hydrocod polst-cpm polst er; Sulfonamide derivatives; and Tramadol hcl   Social History:  The patient  reports that  has never smoked. she has never used smokeless tobacco. She reports that she drinks about 0.6 oz of alcohol per week. She  reports that she does not use drugs.   Family History:  The patient's family history includes Asthma in her grandchild and mother; Emphysema in her father; Heart disease in her father and mother.  ROS:  Please see the history of present illness.  All other systems are reviewed and otherwise negative.   PHYSICAL EXAM:  VS:  There were no vitals taken for this visit. BMI: There is no height or weight on file to calculate BMI. GENERAL:  Well appearing, thin WF in NAD HEENT:  PERRL, EOMI, sclera are clear. Oropharynx is clear. NECK:  No jugular venous distention, carotid upstroke brisk and symmetric, no bruits, no thyromegaly or adenopathy LUNGS:  Clear to auscultation bilaterally CHEST:  Unremarkable HEART:  RRR,  PMI not displaced or sustained,S1 and S2 within normal limits, no S3, no S4: no clicks, no rubs, no murmurs ABD:  Soft, nontender.  BS +, no masses or bruits. No hepatomegaly, no splenomegaly EXT:  2 + pulses throughout, no edema, no cyanosis no clubbing SKIN:  Warm and dry.  No rashes NEURO:  Alert and oriented x 3. Cranial nerves II through XII intact. PSYCH:  Cognitively intact     EKG: Is not done today.    Recent Labs: 04/26/2017: Magnesium 2.1 09/05/2017: ALT 17; BUN 12; Creatinine, Ser 1.06; Hemoglobin 15.0; Platelets 307.0; Potassium 4.1; Sodium 141; TSH 0.45  09/05/2017: Cholesterol 182; Direct LDL 93.0; HDL 41.20; Total CHOL/HDL Ratio 4; Triglycerides 330.0; VLDL 66.0   Estimated Creatinine Clearance: 38 mL/min (by C-G formula based on SCr of 1.06 mg/dL).   Wt Readings from Last 3 Encounters:  09/05/17 104 lb 8 oz (47.4 kg)  07/06/17 105 lb (47.6 kg)  07/05/17 105 lb 6 oz (47.8 kg)     Other studies reviewed: Additional studies/records reviewed today include: summarized above  ASSESSMENT AND PLAN:  1. PAT with history of Pafib in 2016. On appropriate therapy with dig and metoprolol. Pulse is regular today. Continue Xarelto. [place] Vasc score of 3-4. Avoid  stressors when possible. Follow up in 6 months.  2. Fatigue- chronic. Discussed trying to achieve better work/life balance and taking some time during the day for breaks/lunch. 3. Hypothyroidism  4. Hyperlipidemia -  on atorvastatin, recent addition of fenofibrate for triglycerides. Recommend fish oil daily.  5. Mild CAD - continue risk reduction.   Disposition: F/u with me 6 months  Current medicines are reviewed at length with the patient today.  The patient did not have any concerns regarding medicines.   Signed, Peter Martinique MD, [facility]   09/11/2017 12:52 PM     [facility] [address], [place], [place] [address] Phone: [phone]

## 2017-09-15 ENCOUNTER — Encounter: Payer: Self-pay | Admitting: Cardiology

## 2017-09-15 ENCOUNTER — Ambulatory Visit (INDEPENDENT_AMBULATORY_CARE_PROVIDER_SITE_OTHER): Payer: Medicare Other | Admitting: Cardiology

## 2017-09-15 VITALS — BP 138/74 | HR 82 | Ht 63.0 in | Wt 103.2 lb

## 2017-09-15 DIAGNOSIS — I10 Essential (primary) hypertension: Secondary | ICD-10-CM

## 2017-09-15 DIAGNOSIS — I4719 Other supraventricular tachycardia: Secondary | ICD-10-CM

## 2017-09-15 DIAGNOSIS — E7800 Pure hypercholesterolemia, unspecified: Secondary | ICD-10-CM | POA: Diagnosis not present

## 2017-09-15 DIAGNOSIS — I471 Supraventricular tachycardia: Secondary | ICD-10-CM

## 2017-09-15 DIAGNOSIS — I480 Paroxysmal atrial fibrillation: Secondary | ICD-10-CM | POA: Diagnosis not present

## 2017-09-15 NOTE — Patient Instructions (Signed)
Continue your current therapy  Try taking a fish oil capsule daily

## 2017-09-19 ENCOUNTER — Other Ambulatory Visit: Payer: Self-pay | Admitting: Family Medicine

## 2017-09-19 DIAGNOSIS — E876 Hypokalemia: Secondary | ICD-10-CM

## 2017-09-21 ENCOUNTER — Other Ambulatory Visit: Payer: Self-pay | Admitting: Family Medicine

## 2017-11-01 ENCOUNTER — Other Ambulatory Visit: Payer: Self-pay | Admitting: Internal Medicine

## 2017-11-20 ENCOUNTER — Telehealth: Payer: Self-pay

## 2017-11-20 NOTE — Telephone Encounter (Signed)
The office received a letter that that patient had declined to schedule a colonoscopy with [facility].  I left a message for eh patient to return a call to discuss.  Dr. Fuller Plan strongly recommends that the patient reconsider and schedule the colonoscopy to assess the piecemeal polpectomy site.  Left message for patient to call back

## 2017-11-22 NOTE — Telephone Encounter (Signed)
I spoke with the patient today.  She is strongly encouraged to schedule a recall colonoscopy with [facility] at her very next convenience.  We discussed that the polyp that they removed last year was taken off piecemeal and that she needs to ensure that the entire polyp was removed.  Patient reports that she has not been feeling well.  She verbalized understanding that this was a pre-cancerous polyp and she should have a repeat procedure to ensure complete removal.  She stated that she will discuss with her husband and if they decide to proceed she will contact [facility] to schedule.

## 2017-12-06 ENCOUNTER — Ambulatory Visit (INDEPENDENT_AMBULATORY_CARE_PROVIDER_SITE_OTHER): Payer: Medicare Other

## 2017-12-06 ENCOUNTER — Encounter: Payer: Self-pay | Admitting: Physician Assistant

## 2017-12-06 ENCOUNTER — Other Ambulatory Visit: Payer: Self-pay

## 2017-12-06 ENCOUNTER — Ambulatory Visit (INDEPENDENT_AMBULATORY_CARE_PROVIDER_SITE_OTHER): Payer: Medicare Other | Admitting: Physician Assistant

## 2017-12-06 ENCOUNTER — Other Ambulatory Visit: Payer: Self-pay | Admitting: Emergency Medicine

## 2017-12-06 VITALS — BP 110/78 | HR 74 | Temp 98.0°F | Resp 14 | Ht 63.0 in | Wt 101.0 lb

## 2017-12-06 DIAGNOSIS — M545 Low back pain, unspecified: Secondary | ICD-10-CM

## 2017-12-06 DIAGNOSIS — S3992XA Unspecified injury of lower back, initial encounter: Secondary | ICD-10-CM | POA: Diagnosis not present

## 2017-12-06 MED ORDER — CYCLOBENZAPRINE HCL 5 MG PO TABS: 5.0000 mg | ORAL_TABLET | Freq: Every day | ORAL | 1 refills | Status: DC

## 2017-12-06 NOTE — Telephone Encounter (Signed)
Patient requested a refill of the Xanax in office during visit. Last rx was 05/18/16 #60 3 RF  Please advise

## 2017-12-06 NOTE — Progress Notes (Signed)
Patient presents to clinic today c/o pain in lower back bilaterally since a fall last Friday morning. Patient states she stood up out of bed and felt lightheaded and fell down onto her back and buttocks. Did hit her R elbow and the back of her neck. Denies LOC. Has felt fine since that time with the exception of her back which is aching in nature. Pain does not radiate into extremities per patient. Denies numbness, tingling or weakness. No episodes of lightheadedness since. Denies chest pain, palpitations or dizziness. Denies headaches or vision changes. Has taken her chronic medications. Is not taking her Xarelto currently due to cost.   Past Medical History:  Diagnosis Date  . Anemia   . Asthma    - normal spirometry 2009 FEV1  >90% predicted.  - methacoline challenge neg 11/09  . Bronchitis    recurrent  . Cataract   . Chronic cough    - sinus CT 03-23-10 > neg.  - allergy profile 03-23-10 > nl, IgE 14.  - flutter valve rx 03-23-10  . Fibromyalgia   . GERD (gastroesophageal reflux disease)    exacerbating VCD  . Glaucoma   . Hyperlipidemia   . Hypertension   . Hyperthyroidism    with hot nodule.  - total thyroidectomy 11-05-08 benign -> subsequent hypothyroidism.  Marland Kitchen Hypokalemia   . Hypothyroidism   . Hypothyroidism    a. following thyroidectomy.  . Mild CAD    a. LHC 04/2015 - mild nonobstructive CAD with 30% dLAD, 40% D1, 25% pLCx, 30% mRCA, 30% RPDA, normal EF >65% with normal LVEDP.  . Orthostatic hypotension   . Osteoarthritis   . Paroxysmal atrial fibrillation (HCC)   . Renal insufficiency   . Vasomotor rhinitis    exacerbates VCD  . Vocal cord dysfunction    proven on FOB 9/09    Current Outpatient Medications on File Prior to Visit  Medication Sig Dispense Refill  . Acetaminophen (TYLENOL ARTHRITIS PAIN PO) Take 325 mg by mouth daily as needed (FOR MILD PAIN).     Marland Kitchen amitriptyline (ELAVIL) 50 MG tablet TAKE 1 TABLET(50 MG) BY MOUTH AT BEDTIME 90 tablet 0  . atorvastatin  (LIPITOR) 20 MG tablet TAKE 1 TABLET(20 MG) BY MOUTH DAILY 90 tablet 0  . dicyclomine (BENTYL) 10 MG capsule Take 1 capsule (10 mg total) 3 (three) times daily before meals by mouth. For abdominal pain 90 capsule 11  . DIGOX 125 MCG tablet TAKE 1 TABLET BY MOUTH DAILY 90 tablet 1  . levothyroxine (SYNTHROID, LEVOTHROID) 75 MCG tablet Take 1 tablet (75 mcg total) by mouth daily. 90 tablet 1  . metoprolol succinate (TOPROL-XL) 25 MG 24 hr tablet TAKE 1 TABLET(25 MG) BY MOUTH DAILY 90 tablet 0  . spironolactone (ALDACTONE) 25 MG tablet TAKE 1/2 TABLET(12.5 MG) BY MOUTH DAILY 45 tablet 0  . ALPRAZolam (XANAX) 0.5 MG tablet Take 1 tablet (0.5 mg total) by mouth 2 (two) times daily as needed for anxiety. (Patient not taking: Reported on 12/06/2017) 60 tablet 3  . fenofibrate 160 MG tablet Take 1 tablet (160 mg total) by mouth daily. (Patient not taking: Reported on 12/06/2017) 30 tablet 6  . pantoprazole (PROTONIX) 40 MG tablet Take 30- 60 min before your first and last meals of the day (Patient not taking: Reported on 12/06/2017) 60 tablet 3  . XARELTO 15 MG TABS tablet TAKE 1 TABLET(15 MG) BY MOUTH DAILY WITH SUPPER (Patient not taking: Reported on 12/06/2017) 90 tablet 0  No current facility-administered medications on file prior to visit.     Allergies  Allergen Reactions  . Codeine Itching    REACTION: itching  . Erythromycin Nausea And Vomiting    REACTION: nausea and vomiting  . Guaifenesin Er Palpitations  . Hydrocod Polst-Cpm Polst Er Other (See Comments)    Unknown reaction  . Sulfonamide Derivatives Nausea And Vomiting    REACTION: nausea and vomiting  . Tramadol Hcl Itching    REACTION: itching    Family History  Problem Relation Age of Onset  . Asthma Mother   . Heart disease Mother   . Emphysema Father   . Heart disease Father   . Asthma Grandchild     Social History   Socioeconomic History  . Marital status: Married    Spouse name: Not on file  . Number of children: 1   . Years of education: Not on file  . Highest education level: Not on file  Occupational History  . Occupation: owns a Scientist, research (physical sciences)  . Occupation: OWNER    Employer: [employer]  . Financial resource strain: Not on file  . Food insecurity:    Worry: Not on file    Inability: Not on file  . Transportation needs:    Medical: Not on file    Non-medical: Not on file  Tobacco Use  . Smoking status: Never Smoker  . Smokeless tobacco: Never Used  Substance and Sexual Activity  . Alcohol use: Yes    Alcohol/week: 0.6 oz    Types: 1 Glasses of wine per week    Comment: occasional wine or beer  . Drug use: No  . Sexual activity: Yes    Birth control/protection: None  Lifestyle  . Physical activity:    Days per week: Not on file    Minutes per session: Not on file  . Stress: Not on file  Relationships  . Social connections:    Talks on phone: Not on file    Gets together: Not on file    Attends religious service: Not on file    Active member of club or organization: Not on file    Attends meetings of clubs or organizations: Not on file    Relationship status: Not on file  Other Topics Concern  . Not on file  Social History Narrative  . Not on file   Review of Systems - See HPI.  All other ROS are negative.  BP 110/78   Pulse 74   Temp 98 F (36.7 C) (Oral)   Resp 14   Ht 5\' 3"  (1.6 m)   Wt 101 lb (45.8 kg)   SpO2 98%   BMI 17.89 kg/m   Physical Exam  Constitutional: She is oriented to person, place, and time. She appears well-developed and well-nourished.  HENT:  Head: Normocephalic and atraumatic.  Eyes: Conjunctivae are normal.  Cardiovascular: Normal rate, regular rhythm, normal heart sounds and intact distal pulses.  Pulmonary/Chest: Effort normal and breath sounds normal. No stridor. No respiratory distress. She has no wheezes. She has no rales. She exhibits no tenderness.  Musculoskeletal:       Cervical back: Normal.        Thoracic back: Normal.       Lumbar back: She exhibits tenderness, bony tenderness, pain and spasm. She exhibits normal range of motion.  Neurological: She is alert and oriented to person, place, and time. She has normal strength. No cranial nerve deficit or  sensory deficit. Coordination and gait normal.  Vitals reviewed.  Assessment/Plan: 1. Acute left-sided low back pain without sciatica Injury after fall at home 5 days ago. Neurological examination completely within normal limits. She is not on blood thinners presently. (Has been encouraged to talk to her specialist about the cost of Xarelto and other options). There is some bony tenderness of lower lumbar spine. Will obtain x-ray to rule out fracture. Supportive measures and OTC medications reviewed. Rx 5 mg Flexeril to help with spasm. Stretches reviewed. Will alter regimen based on x-ray results.   - DG Lumbar Spine Complete; Future - cyclobenzaprine (FLEXERIL) 5 MG tablet; Take 1 tablet (5 mg total) by mouth at bedtime.  Dispense: 5 tablet; Refill: [place], [place]

## 2017-12-06 NOTE — Patient Instructions (Addendum)
Please go to the [facility] office for x-ray. I will call you with your result.   Please use the cyclobenzaprine as directed in the evening for a couple of days. Hydrate well before taking. No driving while on medication as it can make you sleepy.  Use tylenol arthritis for pain. Apply heating pad to the lower back and neck. The neck tension should calm down over a few days as should the back, assuming there is no fracture present.  Please return immediately for any new or worsening symptoms.

## 2017-12-07 MED ORDER — ALPRAZOLAM 0.5 MG PO TABS: 0.5000 mg | ORAL_TABLET | Freq: Two times a day (BID) | ORAL | 3 refills | Status: DC | PRN

## 2017-12-18 ENCOUNTER — Other Ambulatory Visit: Payer: Self-pay | Admitting: Cardiology

## 2017-12-18 ENCOUNTER — Other Ambulatory Visit: Payer: Self-pay | Admitting: Family Medicine

## 2017-12-18 DIAGNOSIS — E876 Hypokalemia: Secondary | ICD-10-CM

## 2018-03-05 ENCOUNTER — Ambulatory Visit: Payer: Medicare Other | Admitting: Family Medicine

## 2018-03-05 NOTE — Progress Notes (Deleted)
Cardiology Office Note Date:  03/05/2018  Patient ID:  Kaitlyn Adams, Kaitlyn Adams 1951-06-24, MRN [id number] PCP:  Midge Minium, MD  Cardiologist:  Dr. Martinique   Chief Complaint: Pafib  History of Present Illness: Kaitlyn Adams is a 67 y.o. female with history of paroxysmal atrial fibrillation, HTN, HLD, hyperthyroidism s/p thyroidectomy with subsequent hypothyroidism, asthma, GERD, fibromyalgia,  mild nonobstructive CAD who presents for follow-up.  She was admitted 9/22-9/23/16 with complaints of chest discomfort as well as symptoms of recurrent atrial fibrillation including dyspnea, weakness/fatigue, and palpitations with near-syncope. Labs were notable for significant hypokalemia of 2.5 which was repleted.  Troponins were negative. She underwent cath showing mild nonobstructive CAD with 30% dLAD, 40% D1, 25% pLCx, 30% mRCA, 30% RPDA, normal EF >65% with normal LVEDP.  Lasix was stopped due to her hypokalemia. Labs otherwise notable for LDL 140. She had a 30-day event monitor which showed some episodes of PAT. No definite AFib. She was started on Xarelto for [facility] of 3-4 (HTN, age, female, mild vascular disease). She was also started on atorvastatin. 2D echo 05/15/15: EF 40-81%, normal diastolic function. F/u labs 9/26 showed digoxin level 1.1 (digoxin decreased to 0.125mg  daily).  She was admitted in September 2018 with severe anemia and heme positive stool. Hgb down to 5.3. She was transfused. [facility] consulted and performed EGD and colonoscopy 9/6. EGD: Mildly severe Candida esophagitis, small hiatal hernia and normal examined duodenum. Colonoscopy: Melanosis, lipomatous ileocecal valve biopsied, 10 mm polyp in transverse colon resected, diverticulosis of sigmoid and descending colon, nonbleeding internal hemorrhoids. She had capsule endoscopy as an outpatient. This showed one small linear erosion. On recent lab work Hgb improved to 15. She had repeat colonoscopy at [facility] in March  revealing 2 polyps.   On follow up she reports symptoms of chronic fatigue  Wakes up tired in the morning. States she works all day without any breaks and is exhausted by the end of the day.  Notes heart is irregular at times but really fairly minor.  No edema. No chest pain.   Past Medical History:  Diagnosis Date  . Anemia   . Asthma    - normal spirometry 2009 FEV1  >90% predicted.  - methacoline challenge neg 11/09  . Bronchitis    recurrent  . Cataract   . Chronic cough    - sinus CT 03-23-10 > neg.  - allergy profile 03-23-10 > nl, IgE 14.  - flutter valve rx 03-23-10  . Fibromyalgia   . GERD (gastroesophageal reflux disease)    exacerbating VCD  . Glaucoma   . Hyperlipidemia   . Hypertension   . Hyperthyroidism    with hot nodule.  - total thyroidectomy 11-05-08 benign -> subsequent hypothyroidism.  Marland Kitchen Hypokalemia   . Hypothyroidism   . Hypothyroidism    a. following thyroidectomy.  . Mild CAD    a. LHC 04/2015 - mild nonobstructive CAD with 30% dLAD, 40% D1, 25% pLCx, 30% mRCA, 30% RPDA, normal EF >65% with normal LVEDP.  . Orthostatic hypotension   . Osteoarthritis   . Paroxysmal atrial fibrillation (HCC)   . Renal insufficiency   . Vasomotor rhinitis    exacerbates VCD  . Vocal cord dysfunction    proven on FOB 9/09    Past Surgical History:  Procedure Laterality Date  . ABDOMINAL HYSTERECTOMY  1980  . ABDOMINAL HYSTERECTOMY    . BASAL CELL CARCINOMA EXCISION  11/08  . CARDIAC CATHETERIZATION N/A 05/15/2015   Procedure:  Left Heart Cath and Coronary Angiography;  Surgeon: Sherren Mocha, MD;  Location: [facility];  Service: Cardiovascular;  Laterality: N/A;  . COLONOSCOPY N/A 04/27/2017   Dr Silverio Decamp for scant rectal bleeding and iron def anemia: Melanosis coli, lipomatous IC valve, left sided tics, non-bleeding hemorrhoid, 10 mm transverse polyp.    . ESOPHAGOGASTRODUODENOSCOPY N/A 04/27/2017   Mauri Pole, MD; [facility] ENDOSCOPY. for iron def anemia.   Monilial/candidial esophagitis. Small HH.    . EYE SURGERY    . scar tissue removal  1982  . THYROID SURGERY  2010  . THYROIDECTOMY  11-05-08    Current Outpatient Medications  Medication Sig Dispense Refill  . Acetaminophen (TYLENOL ARTHRITIS PAIN PO) Take 325 mg by mouth daily as needed (FOR MILD PAIN).     Marland Kitchen ALPRAZolam (XANAX) 0.5 MG tablet Take 1 tablet (0.5 mg total) by mouth 2 (two) times daily as needed for anxiety. 60 tablet 3  . amitriptyline (ELAVIL) 50 MG tablet TAKE 1 TABLET(50 MG) BY MOUTH AT BEDTIME 90 tablet 0  . atorvastatin (LIPITOR) 20 MG tablet TAKE 1 TABLET(20 MG) BY MOUTH DAILY 90 tablet 0  . cyclobenzaprine (FLEXERIL) 5 MG tablet Take 1 tablet (5 mg total) by mouth at bedtime. 5 tablet 1  . dicyclomine (BENTYL) 10 MG capsule Take 1 capsule (10 mg total) 3 (three) times daily before meals by mouth. For abdominal pain 90 capsule 11  . DIGOX 125 MCG tablet TAKE 1 TABLET BY MOUTH DAILY 90 tablet 3  . fenofibrate 160 MG tablet Take 1 tablet (160 mg total) by mouth daily. (Patient not taking: Reported on 12/06/2017) 30 tablet 6  . levothyroxine (SYNTHROID, LEVOTHROID) 75 MCG tablet Take 1 tablet (75 mcg total) by mouth daily. 90 tablet 1  . metoprolol succinate (TOPROL-XL) 25 MG 24 hr tablet TAKE 1 TABLET(25 MG) BY MOUTH DAILY 90 tablet 0  . pantoprazole (PROTONIX) 40 MG tablet Take 30- 60 min before your first and last meals of the day (Patient not taking: Reported on 12/06/2017) 60 tablet 3  . spironolactone (ALDACTONE) 25 MG tablet TAKE 1/2 TABLET(12.5 MG) BY MOUTH DAILY 45 tablet 0  . XARELTO 15 MG TABS tablet TAKE 1 TABLET(15 MG) BY MOUTH DAILY WITH SUPPER (Patient not taking: Reported on 12/06/2017) 90 tablet 0   No current facility-administered medications for this visit.     Allergies:   Codeine; Erythromycin; Guaifenesin er; Hydrocod polst-cpm polst er; Sulfonamide derivatives; and Tramadol hcl   Social History:  The patient  reports that she has never smoked. She has  never used smokeless tobacco. She reports that she drinks about 0.6 oz of alcohol per week. She reports that she does not use drugs.   Family History:  The patient's family history includes Asthma in her grandchild and mother; Emphysema in her father; Heart disease in her father and mother.  ROS:  Please see the history of present illness.  All other systems are reviewed and otherwise negative.   PHYSICAL EXAM:  VS:  There were no vitals taken for this visit. BMI: There is no height or weight on file to calculate BMI. GENERAL:  Well appearing, thin WF in NAD HEENT:  PERRL, EOMI, sclera are clear. Oropharynx is clear. NECK:  No jugular venous distention, carotid upstroke brisk and symmetric, no bruits, no thyromegaly or adenopathy LUNGS:  Clear to auscultation bilaterally CHEST:  Unremarkable HEART:  RRR,  PMI not displaced or sustained,S1 and S2 within normal limits, no S3, no S4:  no clicks, no rubs, no murmurs ABD:  Soft, nontender. BS +, no masses or bruits. No hepatomegaly, no splenomegaly EXT:  2 + pulses throughout, no edema, no cyanosis no clubbing SKIN:  Warm and dry.  No rashes NEURO:  Alert and oriented x 3. Cranial nerves II through XII intact. PSYCH:  Cognitively intact     EKG: Is not done today.    Recent Labs: 04/26/2017: Magnesium 2.1 09/05/2017: ALT 17; BUN 12; Creatinine, Ser 1.06; Hemoglobin 15.0; Platelets 307.0; Potassium 4.1; Sodium 141; TSH 0.45  09/05/2017: Cholesterol 182; Direct LDL 93.0; HDL 41.20; Total CHOL/HDL Ratio 4; Triglycerides 330.0; VLDL 66.0   CrCl cannot be calculated (Patient's most recent lab result is older than the maximum 21 days allowed.).   Wt Readings from Last 3 Encounters:  12/06/17 101 lb (45.8 kg)  09/15/17 103 lb 4 oz (46.8 kg)  09/05/17 104 lb 8 oz (47.4 kg)     Other studies reviewed: Additional studies/records reviewed today include: summarized above  ASSESSMENT AND PLAN:  1. PAT with history of Pafib in 2016. On  appropriate therapy with dig and metoprolol. Pulse is regular today. Continue Xarelto. [place] Vasc score of 3-4. Avoid stressors when possible. Follow up in 6 months.  2. Fatigue- chronic. Discussed trying to achieve better work/life balance and taking some time during the day for breaks/lunch. 3. Hypothyroidism  4. Hyperlipidemia -  on atorvastatin, recent addition of fenofibrate for triglycerides. Recommend fish oil daily.  5. Mild CAD - continue risk reduction.   Disposition: F/u with me 6 months  Current medicines are reviewed at length with the patient today.  The patient did not have any concerns regarding medicines.   Signed, Chatham Howington Martinique MD, [facility]   03/05/2018 7:29 AM     [facility] [address] [place], [place] [address] Phone: [phone]

## 2018-03-08 ENCOUNTER — Ambulatory Visit: Payer: Medicare Other | Admitting: Cardiology

## 2018-03-08 DIAGNOSIS — E559 Vitamin D deficiency, unspecified: Secondary | ICD-10-CM | POA: Diagnosis not present

## 2018-03-08 DIAGNOSIS — E039 Hypothyroidism, unspecified: Secondary | ICD-10-CM | POA: Diagnosis not present

## 2018-03-15 ENCOUNTER — Other Ambulatory Visit: Payer: Self-pay | Admitting: Family Medicine

## 2018-03-15 DIAGNOSIS — E876 Hypokalemia: Secondary | ICD-10-CM

## 2018-03-20 DIAGNOSIS — H40023 Open angle with borderline findings, high risk, bilateral: Secondary | ICD-10-CM | POA: Diagnosis not present

## 2018-03-23 DIAGNOSIS — R634 Abnormal weight loss: Secondary | ICD-10-CM | POA: Diagnosis not present

## 2018-03-23 DIAGNOSIS — E559 Vitamin D deficiency, unspecified: Secondary | ICD-10-CM | POA: Diagnosis not present

## 2018-03-23 DIAGNOSIS — E039 Hypothyroidism, unspecified: Secondary | ICD-10-CM | POA: Diagnosis not present

## 2018-03-23 DIAGNOSIS — M899 Disorder of bone, unspecified: Secondary | ICD-10-CM | POA: Diagnosis not present

## 2018-04-04 ENCOUNTER — Ambulatory Visit: Payer: Medicare Other | Admitting: Family Medicine

## 2018-05-23 DIAGNOSIS — E039 Hypothyroidism, unspecified: Secondary | ICD-10-CM | POA: Diagnosis not present

## 2018-05-31 ENCOUNTER — Ambulatory Visit (INDEPENDENT_AMBULATORY_CARE_PROVIDER_SITE_OTHER): Payer: Medicare Other | Admitting: Family Medicine

## 2018-05-31 ENCOUNTER — Other Ambulatory Visit: Payer: Self-pay

## 2018-05-31 ENCOUNTER — Encounter: Payer: Self-pay | Admitting: Family Medicine

## 2018-05-31 VITALS — BP 110/81 | HR 76 | Temp 97.7°F | Resp 16 | Ht 64.0 in | Wt 99.2 lb

## 2018-05-31 DIAGNOSIS — E440 Moderate protein-calorie malnutrition: Secondary | ICD-10-CM | POA: Diagnosis not present

## 2018-05-31 DIAGNOSIS — B9689 Other specified bacterial agents as the cause of diseases classified elsewhere: Secondary | ICD-10-CM | POA: Diagnosis not present

## 2018-05-31 DIAGNOSIS — Y92009 Unspecified place in unspecified non-institutional (private) residence as the place of occurrence of the external cause: Secondary | ICD-10-CM | POA: Diagnosis not present

## 2018-05-31 DIAGNOSIS — W19XXXA Unspecified fall, initial encounter: Secondary | ICD-10-CM

## 2018-05-31 DIAGNOSIS — J329 Chronic sinusitis, unspecified: Secondary | ICD-10-CM

## 2018-05-31 MED ORDER — BENZONATATE 200 MG PO CAPS: 200.0000 mg | ORAL_CAPSULE | Freq: Three times a day (TID) | ORAL | 0 refills | Status: DC | PRN

## 2018-05-31 MED ORDER — AMOXICILLIN 875 MG PO TABS: 875.0000 mg | ORAL_TABLET | Freq: Two times a day (BID) | ORAL | 0 refills | Status: DC

## 2018-05-31 NOTE — Progress Notes (Signed)
   Subjective:    Patient ID: Kaitlyn Adams, female    DOB: 1951/08/17, 67 y.o.   MRN: [id number]  HPI URI- sxs started 'a couple of days ago' but worsened yesterday.  No fever.  + frontal and occipital headache but fell last week hitting her head (on xarelto- reports she is not taking).  + nasal congestion.  No tooth pain.  + cough- not productive.  Mild ear fullness.  No known sick contacts.  No N/V.  Fall- pt tripped after waking in the middle of the night.  Hit the back of her head.  No dizziness.  No blurry or double vision.  Denies focal weakness or numbness.  Weight loss- pt is down 3 lbs.  Now 99 lbs.  Reports she has been under considerable stress w/ her husband's medical issues   Review of Systems For ROS see HPI     Objective:   Physical Exam  Constitutional: She is oriented to person, place, and time. She appears well-developed. No distress.  Cachectic appearing  HENT:  Head: Normocephalic and atraumatic.  Right Ear: Tympanic membrane normal.  Left Ear: Tympanic membrane normal.  Nose: Mucosal edema and rhinorrhea present. Right sinus exhibits frontal sinus tenderness. Right sinus exhibits no maxillary sinus tenderness. Left sinus exhibits frontal sinus tenderness. Left sinus exhibits no maxillary sinus tenderness.  Mouth/Throat: Uvula is midline and mucous membranes are normal. Posterior oropharyngeal erythema present. No oropharyngeal exudate.  Eyes: Pupils are equal, round, and reactive to light. Conjunctivae and EOM are normal.  Neck: Normal range of motion. Neck supple.  Cardiovascular: Normal rate, regular rhythm and normal heart sounds.  Pulmonary/Chest: Effort normal and breath sounds normal. No respiratory distress. She has no wheezes.  Lymphadenopathy:    She has no cervical adenopathy.  Neurological: She is alert and oriented to person, place, and time. She displays normal reflexes. No cranial nerve deficit. Coordination normal.  Skin: Skin is warm and dry.    Vitals reviewed.         Assessment & Plan:  Bacterial sinusitis- new.  Pt's sxs and PE consistent w/ infxn.  Start Amoxicillin.  Reviewed supportive care and red flags that should prompt return.  Pt expressed understanding and is in agreement w/ plan.   Fall- thankfully pt was not taking her blood thinners and has a normal neuro exam today.  No imaging needed at this time.  Protein calorie malnutrition- ongoing issue.  She is down 3 lbs- which doesn't seem like much but makes a big difference when you are only 99 lbs.  Stressed need for regular eating and addition of protein shakes.  Pt expressed understanding and is in agreement w/ plan.

## 2018-05-31 NOTE — Patient Instructions (Addendum)
Follow up as needed or as scheduled START the Amoxicillin twice daily- take w/ food- for the sinus infection Drink plenty of fluids Use the Benzonatate as needed for cough REST! Consider adding protein shakes when you don't feel like eating Call with any questions or concerns Hang in there!

## 2018-06-07 NOTE — Progress Notes (Signed)
Cardiology Office Note Date:  06/08/2018  Patient ID:  Kaitlyn Adams, Kaitlyn Adams May 11, 1951, MRN [id number] PCP:  Midge Minium, MD  Cardiologist:  Dr. Martinique   Chief Complaint: PAT  History of Present Illness: Kaitlyn Adams is a 67 y.o. female with history of paroxysmal atrial fibrillation, PAT, HTN, HLD, hyperthyroidism s/p thyroidectomy with subsequent hypothyroidism, asthma, GERD, fibromyalgia,  mild nonobstructive CAD who presents for follow-up.  She was admitted 9/22-9/23/16 with complaints of chest discomfort as well as symptoms of recurrent atrial fibrillation including dyspnea, weakness/fatigue, and palpitations with near-syncope. Labs were notable for significant hypokalemia of 2.5 which was repleted.  Troponins were negative. She underwent cath showing mild nonobstructive CAD with 30% dLAD, 40% D1, 25% pLCx, 30% mRCA, 30% RPDA, normal EF >65% with normal LVEDP.  Lasix was stopped due to her hypokalemia. Labs otherwise notable for LDL 140. She had a 30-day event monitor which showed some episodes of PAT. No  AFib. She was started on Xarelto for [facility] of 3-4 (HTN, age, female, mild vascular disease). She was also started on atorvastatin. 2D echo 05/15/15: EF 78-29%, normal diastolic function. F/u labs 9/26 showed digoxin level 1.1 (digoxin decreased to 0.125mg  daily) and level came down to 0.6.   She was admitted in September 2018 with severe anemia and heme positive stool. Hgb down to 5.3. She was transfused. [facility] and performed EGD and colonoscopy 9/6. EGD: Mildly severe Candida esophagitis, small hiatal hernia and normal examined duodenum. Colonoscopy: Melanosis, lipomatous ileocecal valve biopsied, 10 mm polyp in transverse colon resected, diverticulosis of sigmoid and descending colon, nonbleeding internal hemorrhoids. She had capsule endoscopy as an outpatient. This showed one small linear erosion. On repeat lab work Hgb improved to 15.   Since her last  visit she stopped taking Xarelto. It was just too expensive. She has had a couple of falls recently and hit her head so she is worried about taking a blood thinner. She has recurrent bronchitis and is finishing a course of antibiotics. She does still have palpitations a couple of times per week- usually when she is stressed or tired. Reports thyroid is OK. Followed by Dr. Chalmers Cater.   Past Medical History:  Diagnosis Date  . Anemia   . Asthma    - normal spirometry 2009 FEV1  >90% predicted.  - methacoline challenge neg 11/09  . Bronchitis    recurrent  . Cataract   . Chronic cough    - sinus CT 03-23-10 > neg.  - allergy profile 03-23-10 > nl, IgE 14.  - flutter valve rx 03-23-10  . Fibromyalgia   . GERD (gastroesophageal reflux disease)    exacerbating VCD  . Glaucoma   . Hyperlipidemia   . Hypertension   . Hyperthyroidism    with hot nodule.  - total thyroidectomy 11-05-08 benign -> subsequent hypothyroidism.  Marland Kitchen Hypokalemia   . Hypothyroidism   . Hypothyroidism    a. following thyroidectomy.  . Mild CAD    a. LHC 04/2015 - mild nonobstructive CAD with 30% dLAD, 40% D1, 25% pLCx, 30% mRCA, 30% RPDA, normal EF >65% with normal LVEDP.  . Orthostatic hypotension   . Osteoarthritis   . Paroxysmal atrial fibrillation (HCC)   . Renal insufficiency   . Vasomotor rhinitis    exacerbates VCD  . Vocal cord dysfunction    proven on FOB 9/09    Past Surgical History:  Procedure Laterality Date  . ABDOMINAL HYSTERECTOMY  1980  . ABDOMINAL HYSTERECTOMY    .  BASAL CELL CARCINOMA EXCISION  11/08  . CARDIAC CATHETERIZATION N/A 05/15/2015   Procedure: Left Heart Cath and Coronary Angiography;  Surgeon: Sherren Mocha, MD;  Location: [facility];  Service: Cardiovascular;  Laterality: N/A;  . COLONOSCOPY N/A 04/27/2017   Dr Silverio Decamp for scant rectal bleeding and iron def anemia: Melanosis coli, lipomatous IC valve, left sided tics, non-bleeding hemorrhoid, 10 mm transverse polyp.    .  ESOPHAGOGASTRODUODENOSCOPY N/A 04/27/2017   Mauri Pole, MD; [facility] ENDOSCOPY. for iron def anemia.  Monilial/candidial esophagitis. Small HH.    . EYE SURGERY    . scar tissue removal  1982  . THYROID SURGERY  2010  . THYROIDECTOMY  11-05-08    Current Outpatient Medications  Medication Sig Dispense Refill  . Acetaminophen (TYLENOL ARTHRITIS PAIN PO) Take 325 mg by mouth daily as needed (FOR MILD PAIN).     Marland Kitchen ALPRAZolam (XANAX) 0.5 MG tablet Take 1 tablet (0.5 mg total) by mouth 2 (two) times daily as needed for anxiety. 60 tablet 3  . amitriptyline (ELAVIL) 50 MG tablet TAKE 1 TABLET(50 MG) BY MOUTH AT BEDTIME 90 tablet 0  . amoxicillin (AMOXIL) 875 MG tablet Take 1 tablet (875 mg total) by mouth 2 (two) times daily. 20 tablet 0  . atorvastatin (LIPITOR) 20 MG tablet TAKE 1 TABLET(20 MG) BY MOUTH DAILY 90 tablet 0  . dicyclomine (BENTYL) 10 MG capsule Take 1 capsule (10 mg total) 3 (three) times daily before meals by mouth. For abdominal pain 90 capsule 11  . DIGOX 125 MCG tablet TAKE 1 TABLET BY MOUTH DAILY 90 tablet 3  . levothyroxine (SYNTHROID, LEVOTHROID) 75 MCG tablet Take 1 tablet (75 mcg total) by mouth daily. 90 tablet 1  . metoprolol succinate (TOPROL-XL) 25 MG 24 hr tablet TAKE 1 TABLET(25 MG) BY MOUTH DAILY 90 tablet 0  . spironolactone (ALDACTONE) 25 MG tablet TAKE 1/2 TABLET(12.5 MG) BY MOUTH DAILY 45 tablet 0   No current facility-administered medications for this visit.     Allergies:   Fenofibrate; Codeine; Erythromycin; Guaifenesin er; Hydrocod polst-cpm polst er; Sulfonamide derivatives; and Tramadol hcl   Social History:  The patient  reports that she has never smoked. She has never used smokeless tobacco. She reports that she drinks about 1.0 standard drinks of alcohol per week. She reports that she does not use drugs.   Family History:  The patient's family history includes Asthma in her grandchild and mother; Emphysema in her father; Heart disease in her father  and mother.  ROS:  Please see the history of present illness.  All other systems are reviewed and otherwise negative.   PHYSICAL EXAM:  VS:  BP 98/66   Pulse 65   Ht 5\' 4"  (1.626 m)   Wt 101 lb (45.8 kg)   BMI 17.34 kg/m  BMI: Body mass index is 17.34 kg/m. GENERAL:  Well appearing, thin WF in NAD HEENT:  PERRL, EOMI, sclera are clear. Oropharynx is clear. NECK:  No jugular venous distention, carotid upstroke brisk and symmetric, no bruits, no thyromegaly or adenopathy LUNGS:  Clear to auscultation bilaterally CHEST:  Unremarkable HEART:  RRR,  PMI not displaced or sustained,S1 and S2 within normal limits, no S3, no S4: no clicks, no rubs, no murmurs ABD:  Soft, nontender. BS +, no masses or bruits. No hepatomegaly, no splenomegaly EXT:  2 + pulses throughout, no edema, no cyanosis no clubbing SKIN:  Warm and dry.  No rashes NEURO:  Alert and oriented x  3. Cranial nerves II through XII intact. PSYCH:  Cognitively intact  EKG: Is  done today. NSR with rate 65. ST changes c/w Dig effect. I have personally reviewed and interpreted this study.    Recent Labs: 09/05/2017: ALT 17; BUN 12; Creatinine, Ser 1.06; Hemoglobin 15.0; Platelets 307.0; Potassium 4.1; Sodium 141; TSH 0.45  09/05/2017: Cholesterol 182; Direct LDL 93.0; HDL 41.20; Total CHOL/HDL Ratio 4; Triglycerides 330.0; VLDL 66.0   CrCl cannot be calculated (Patient's most recent lab result is older than the maximum 21 days allowed.).   Wt Readings from Last 3 Encounters:  06/08/18 101 lb (45.8 kg)  05/31/18 99 lb 4 oz (45 kg)  12/06/17 101 lb (45.8 kg)     Other studies reviewed: Additional studies/records reviewed today include: summarized above  ASSESSMENT AND PLAN:  1. PAT with history of Pafib in 2016. Mostly PAT though. I think is is reasonable to stop anticoagulation given she has only had one documented episode of AFib and has a history of major GI bleed and falls. Unable to afford Xarelto anyway so the only  alternative would be coumadin. Will continue dig and metoprolol. Pulse is regular today.  Avoid stressors when possible. Follow up in 6 months.  2. Fatigue- chronic.  3. Hypothyroidism  4. Hyperlipidemia -  on atorvastatin, unable to tolerate fenofibrate due to GI effects.  5. Mild CAD - continue risk reduction.   Disposition: F/u with me 6 months  Current medicines are reviewed at length with the patient today.  The patient did not have any concerns regarding medicines.   Signed, Peter Martinique MD, [facility]   06/08/2018 2:31 PM     [facility] [address] [place], [place] [address] Phone: [phone]

## 2018-06-08 ENCOUNTER — Encounter: Payer: Self-pay | Admitting: Cardiology

## 2018-06-08 ENCOUNTER — Ambulatory Visit (INDEPENDENT_AMBULATORY_CARE_PROVIDER_SITE_OTHER): Payer: Medicare Other | Admitting: Cardiology

## 2018-06-08 VITALS — BP 98/66 | HR 65 | Ht 64.0 in | Wt 101.0 lb

## 2018-06-08 DIAGNOSIS — I471 Supraventricular tachycardia: Secondary | ICD-10-CM

## 2018-06-08 DIAGNOSIS — I10 Essential (primary) hypertension: Secondary | ICD-10-CM | POA: Diagnosis not present

## 2018-06-08 DIAGNOSIS — E7800 Pure hypercholesterolemia, unspecified: Secondary | ICD-10-CM

## 2018-06-08 NOTE — Addendum Note (Signed)
Addended by: Kathyrn Lass on: 06/08/2018 02:35 PM   Modules accepted: Orders

## 2018-06-12 ENCOUNTER — Other Ambulatory Visit: Payer: Self-pay | Admitting: Family Medicine

## 2018-06-12 DIAGNOSIS — E876 Hypokalemia: Secondary | ICD-10-CM

## 2018-06-18 ENCOUNTER — Ambulatory Visit (INDEPENDENT_AMBULATORY_CARE_PROVIDER_SITE_OTHER): Payer: Medicare Other | Admitting: Family Medicine

## 2018-06-18 ENCOUNTER — Other Ambulatory Visit: Payer: Self-pay

## 2018-06-18 ENCOUNTER — Encounter: Payer: Self-pay | Admitting: Family Medicine

## 2018-06-18 VITALS — BP 128/72 | HR 91 | Temp 99.6°F | Resp 16 | Ht 64.0 in | Wt 99.0 lb

## 2018-06-18 DIAGNOSIS — B9689 Other specified bacterial agents as the cause of diseases classified elsewhere: Secondary | ICD-10-CM | POA: Diagnosis not present

## 2018-06-18 DIAGNOSIS — J9801 Acute bronchospasm: Secondary | ICD-10-CM

## 2018-06-18 DIAGNOSIS — J208 Acute bronchitis due to other specified organisms: Secondary | ICD-10-CM | POA: Diagnosis not present

## 2018-06-18 MED ORDER — GUAIFENESIN-CODEINE 100-10 MG/5ML PO SOLN: 5.0000 mL | Freq: Four times a day (QID) | ORAL | 0 refills | Status: DC | PRN

## 2018-06-18 MED ORDER — AZITHROMYCIN 250 MG PO TABS: ORAL_TABLET | ORAL | 0 refills | Status: DC

## 2018-06-18 MED ORDER — PREDNISONE 20 MG PO TABS: ORAL_TABLET | ORAL | 0 refills | Status: DC

## 2018-06-18 NOTE — Progress Notes (Signed)
Subjective  CC:  Chief Complaint  Patient presents with  . Cough    severe since friday night, runny nose  . Fever    HPI: SUBJECTIVE:  Same day acute visit; PCP not available. New pt to me. Chart reviewed.   Kaitlyn Adams is a 67 y.o. female who complains of congestion, nasal blockage, post nasal drip, cough described as barky, harsh, painful, paroxysmal and productive and denies sinus, high fevers, SOB, chest pain or significant GI symptoms. Symptoms have been present for 3-4 days. Completed amox 9 days ago for sinus infection. Those sxs have resolved. She denies a history of anorexia, dizziness, vomiting and wheezing. She denies a history of asthma or COPD. Patient does not smoke cigarettes. She tried tessalon perles w/o relief.  Has used rob AC in past with good results.   Assessment  1. Acute bacterial bronchitis   2. Bronchospasm      Plan  Discussion:  Treat for bacterial bronchitis due to prolonged course and worsening symptoms.add pred burst for cough and bronchospasm. Education regarding differences between viral and bacterial infections and treatment options are discussed.  Supportive care measures are recommended.  We discussed the use of mucolytic's, decongestants, antihistamines and antitussives as needed.  Tylenol or Advil are recommended if needed.  Follow up: prn   No orders of the defined types were placed in this encounter.  Meds ordered this encounter  Medications  . azithromycin (ZITHROMAX) 250 MG tablet    Sig: Take 2 tabs today, then 1 tab daily for 4 days    Dispense:  1 each    Refill:  0  . predniSONE (DELTASONE) 20 MG tablet    Sig: Take 3 tabs daily for 5 days    Dispense:  15 tablet    Refill:  0  . guaiFENesin-codeine 100-10 MG/5ML syrup    Sig: Take 5 mLs by mouth every 6 (six) hours as needed for cough.    Dispense:  120 mL    Refill:  0      I reviewed the patients updated PMH, FH, and SocHx.  Social History: Patient  reports  that she has never smoked. She has never used smokeless tobacco. She reports that she drinks about 1.0 standard drinks of alcohol per week. She reports that she does not use drugs.  Patient Active Problem List   Diagnosis Date Noted  . Upper airway cough syndrome 05/22/2017  . Polyp of transverse colon   . Symptomatic anemia 04/26/2017  . Left shoulder pain 07/20/2016  . Chronic constipation 07/07/2016  . History of colonic polyps 07/07/2016  . Chronic anticoagulation 07/07/2016  . Iron deficiency anemia 06/15/2016  . Anxiety and depression 05/18/2016  . Physical exam 11/18/2015  . Pain in joint, ankle and foot 11/16/2015  . PAT (paroxysmal atrial tachycardia) ([place]) 09/08/2015  . Hyperlipidemia 07/30/2015  . CAD (coronary artery disease)   . Chest pain at rest, not due to CAD, possible due to a fib. 05/15/2015  . Chronic bronchitis ([facility]) 06/17/2011  . Orthostatic hypotension 12/06/2010  . Paroxysmal atrial fibrillation (HCC)   . Renal insufficiency   . Myalgia   . Hypothyroidism   . Glaucoma   . GERD, SEVERE 08/19/2010  . ALLERGIC RHINITIS 04/21/2010  . Cough 03/31/2010  . VOCAL CORD DISORDER 05/13/2008  . Essential hypertension 04/17/2008  . OSTEOARTHRITIS 04/17/2008  . FIBROMYALGIA 04/17/2008    Review of Systems: Cardiovascular: negative for chest pain Respiratory: negative for SOB or hemoptysis Gastrointestinal: negative  for abdominal pain Genitourinary: negative for dysuria or gross hematuria Current Meds  Medication Sig  . Acetaminophen (TYLENOL ARTHRITIS PAIN PO) Take 325 mg by mouth daily as needed (FOR MILD PAIN).   Marland Kitchen ALPRAZolam (XANAX) 0.5 MG tablet Take 1 tablet (0.5 mg total) by mouth 2 (two) times daily as needed for anxiety.  Marland Kitchen amitriptyline (ELAVIL) 50 MG tablet TAKE 1 TABLET(50 MG) BY MOUTH AT BEDTIME  . atorvastatin (LIPITOR) 20 MG tablet TAKE 1 TABLET(20 MG) BY MOUTH DAILY  . dicyclomine (BENTYL) 10 MG capsule Take 1 capsule (10 mg total) 3 (three)  times daily before meals by mouth. For abdominal pain  . DIGOX 125 MCG tablet TAKE 1 TABLET BY MOUTH DAILY  . levothyroxine (SYNTHROID, LEVOTHROID) 75 MCG tablet Take 1 tablet (75 mcg total) by mouth daily.  . metoprolol succinate (TOPROL-XL) 25 MG 24 hr tablet TAKE 1 TABLET(25 MG) BY MOUTH DAILY  . spironolactone (ALDACTONE) 25 MG tablet TAKE 1/2 TABLET(12.5 MG) BY MOUTH DAILY  . [DISCONTINUED] amoxicillin (AMOXIL) 875 MG tablet Take 1 tablet (875 mg total) by mouth 2 (two) times daily.    Objective  Vitals: BP 128/72   Pulse 91   Temp 99.6 F (37.6 C) (Oral)   Resp 16   Ht 5\' 4"  (1.626 m)   Wt 99 lb (44.9 kg)   SpO2 95%   BMI 16.99 kg/m  General: no acute distress  Thin, coughing. Psych:  Alert and oriented, normal mood and affect HEENT:  Normocephalic, atraumatic, supple neck, moist mucous membranes, mildly erythematous pharynx without exudate, mild lymphadenopathy, supple neck Cardiovascular:  RRR without murmur. no edema Respiratory:  Good breath sounds bilaterally, CTAB with occ wheeze with forced expiration.  Skin:  Warm, no rashes Neurologic:   Mental status is normal. normal gait  Commons side effects, risks, benefits, and alternatives for medications and treatment plan prescribed today were discussed, and the patient expressed understanding of the given instructions. Patient is instructed to call or message via MyChart if he/she has any questions or concerns regarding our treatment plan. No barriers to understanding were identified. We discussed Red Flag symptoms and signs in detail. Patient expressed understanding regarding what to do in case of urgent or emergency type symptoms.  Medication list was reconciled, printed and provided to the patient in AVS. Patient instructions and summary information was reviewed with the patient as documented in the AVS. This note was prepared with assistance of Dragon voice recognition software. Occasional wrong-word or sound-a-like  substitutions may have occurred due to the inherent limitations of voice recognition software

## 2018-06-18 NOTE — Patient Instructions (Signed)
Please follow up if symptoms do not improve or as needed.  You may use benadryl before taking the cough syrup. STOP for swelling of the face or rash.   Acute Bronchitis, Adult Acute bronchitis is sudden (acute) swelling of the air tubes (bronchi) in the lungs. Acute bronchitis causes these tubes to fill with mucus, which can make it hard to breathe. It can also cause coughing or wheezing. In adults, acute bronchitis usually goes away within 2 weeks. A cough caused by bronchitis may last up to 3 weeks. Smoking, allergies, and asthma can make the condition worse. Repeated episodes of bronchitis may cause further lung problems, such as chronic obstructive pulmonary disease (COPD). What are the causes? This condition can be caused by germs and by substances that irritate the lungs, including:  Cold and flu viruses. This condition is most often caused by the same virus that causes a cold.  Bacteria.  Exposure to tobacco smoke, dust, fumes, and air pollution.  What increases the risk? This condition is more likely to develop in people who:  Have close contact with someone with acute bronchitis.  Are exposed to lung irritants, such as tobacco smoke, dust, fumes, and vapors.  Have a weak immune system.  Have a respiratory condition such as asthma.  What are the signs or symptoms? Symptoms of this condition include:  A cough.  Coughing up clear, yellow, or green mucus.  Wheezing.  Chest congestion.  Shortness of breath.  A fever.  Body aches.  Chills.  A sore throat.  How is this diagnosed? This condition is usually diagnosed with a physical exam. During the exam, your health care provider may order tests, such as chest X-rays, to rule out other conditions. He or she may also:  Test a sample of your mucus for bacterial infection.  Check the level of oxygen in your blood. This is done to check for pneumonia.  Do a chest X-ray or lung function testing to rule out pneumonia  and other conditions.  Perform blood tests.  Your health care provider will also ask about your symptoms and medical history. How is this treated? Most cases of acute bronchitis clear up over time without treatment. Your health care provider may recommend:  Drinking more fluids. Drinking more makes your mucus thinner, which may make it easier to breathe.  Taking a medicine for a fever or cough.  Taking an antibiotic medicine.  Using an inhaler to help improve shortness of breath and to control a cough.  Using a cool mist vaporizer or humidifier to make it easier to breathe.  Follow these instructions at home: Medicines  Take over-the-counter and prescription medicines only as told by your health care provider.  If you were prescribed an antibiotic, take it as told by your health care provider. Do not stop taking the antibiotic even if you start to feel better. General instructions  Get plenty of rest.  Drink enough fluids to keep your urine clear or pale yellow.  Avoid smoking and secondhand smoke. Exposure to cigarette smoke or irritating chemicals will make bronchitis worse. If you smoke and you need help quitting, ask your health care provider. Quitting smoking will help your lungs heal faster.  Use an inhaler, cool mist vaporizer, or humidifier as told by your health care provider.  Keep all follow-up visits as told by your health care provider. This is important. How is this prevented? To lower your risk of getting this condition again:  Wash your hands often with  soap and water. If soap and water are not available, use hand sanitizer.  Avoid contact with people who have cold symptoms.  Try not to touch your hands to your mouth, nose, or eyes.  Make sure to get the flu shot every year.  Contact a health care provider if:  Your symptoms do not improve in 2 weeks of treatment. Get help right away if:  You cough up blood.  You have chest pain.  You have severe  shortness of breath.  You become dehydrated.  You faint or keep feeling like you are going to faint.  You keep vomiting.  You have a severe headache.  Your fever or chills gets worse. This information is not intended to replace advice given to you by your health care provider. Make sure you discuss any questions you have with your health care provider. Document Released: 09/15/2004 Document Revised: 03/02/2016 Document Reviewed: 01/27/2016 Elsevier Interactive Patient Education  [employer].

## 2018-07-10 NOTE — Progress Notes (Addendum)
Subjective:   Kaitlyn Adams is a 67 y.o. female who presents for Medicare Annual (Subsequent) preventive examination.  Review of Systems:  No ROS.  Medicare Wellness Visit. Additional risk factors are reflected in the social history.  Cardiac Risk Factors include: advanced age (>15men, >11 women);dyslipidemia;hypertension;family history of premature cardiovascular disease   Sleep patterns: Sleeps 6-7 hours. Amitriptyline nightly.  Home Safety/Smoke Alarms: Feels safe in home. Smoke alarms in place.  Living environment; residence and Firearm Safety: Lives with husband in 2 story home.  Seat Belt Safety/Bike Helmet: Wears seat belt.   Female:   [id number]       Mammo-09/02/2015, benign. Ordered today, [facility].       Dexa scan-03/15/2017, Osteopenia.         CCS-Colonoscopy 06/01/2017 ([facility]), recall 3-6 MONTHS.      Objective:     Vitals: BP 110/60 (BP Location: Left Arm, Patient Position: Sitting, Cuff Size: Normal)   Pulse (!) 59   Ht 5\' 4"  (1.626 m)   Wt 98 lb 8 oz (44.7 kg)   SpO2 98%   BMI 16.91 kg/m   Body mass index is 16.91 kg/m.  Advanced Directives 07/11/2018 07/06/2017 04/26/2017 04/25/2017 05/15/2015  Does Patient Have a Medical Advance Directive? No No No No Yes  Type of Advance Directive - - - - Living will  Does patient want to make changes to medical advance directive? - - - - No - Patient declined  Copy of [place] in Chart? - - - - No - copy requested  Would patient like information on creating a medical advance directive? Yes (MAU/Ambulatory/Procedural Areas - Information given) Yes (MAU/Ambulatory/Procedural Areas - Information given) No - Patient declined - -    Tobacco Social History   Tobacco Use  Smoking Status Never Smoker  Smokeless Tobacco Never Used     Counseling given: Not Answered   Past Medical History:  Diagnosis Date  . Anemia   . Asthma    - normal spirometry 2009 FEV1  >90% predicted.  - methacoline challenge  neg 11/09  . Bronchitis    recurrent  . Cataract   . Chronic cough    - sinus CT 03-23-10 > neg.  - allergy profile 03-23-10 > nl, IgE 14.  - flutter valve rx 03-23-10  . Fibromyalgia   . GERD (gastroesophageal reflux disease)    exacerbating VCD  . Glaucoma   . Hyperlipidemia   . Hypertension   . Hyperthyroidism    with hot nodule.  - total thyroidectomy 11-05-08 benign -> subsequent hypothyroidism.  Marland Kitchen Hypokalemia   . Hypothyroidism   . Hypothyroidism    a. following thyroidectomy.  . Mild CAD    a. LHC 04/2015 - mild nonobstructive CAD with 30% dLAD, 40% D1, 25% pLCx, 30% mRCA, 30% RPDA, normal EF >65% with normal LVEDP.  . Orthostatic hypotension   . Osteoarthritis   . Paroxysmal atrial fibrillation (HCC)   . Renal insufficiency   . Vasomotor rhinitis    exacerbates VCD  . Vocal cord dysfunction    proven on FOB 9/09   Past Surgical History:  Procedure Laterality Date  . ABDOMINAL HYSTERECTOMY  1980  . ABDOMINAL HYSTERECTOMY    . BASAL CELL CARCINOMA EXCISION  11/08  . CARDIAC CATHETERIZATION N/A 05/15/2015   Procedure: Left Heart Cath and Coronary Angiography;  Surgeon: Sherren Mocha, MD;  Location: [facility];  Service: Cardiovascular;  Laterality: N/A;  . COLONOSCOPY N/A 04/27/2017   Dr  Nandigam for scant rectal bleeding and iron def anemia: Melanosis coli, lipomatous IC valve, left sided tics, non-bleeding hemorrhoid, 10 mm transverse polyp.    . ESOPHAGOGASTRODUODENOSCOPY N/A 04/27/2017   Mauri Pole, MD; [facility] ENDOSCOPY. for iron def anemia.  Monilial/candidial esophagitis. [facility] HH.    . EYE SURGERY    . scar tissue removal  1982  . THYROID SURGERY  2010  . THYROIDECTOMY  11-05-08   Family History  Problem Relation Age of Onset  . Asthma Mother   . Heart disease Mother   . Emphysema Father   . Heart disease Father   . Irregular heart beat Brother   . Asthma Grandchild    Social History   Socioeconomic History  . Marital status: Married    Spouse  name: Not on file  . Number of children: 1  . Years of education: Not on file  . Highest education level: Not on file  Occupational History  . Occupation: owns a Scientist, research (physical sciences)  . Occupation: OWNER    Employer: [employer]  . Financial resource strain: Not on file  . Food insecurity:    Worry: Not on file    Inability: Not on file  . Transportation needs:    Medical: Not on file    Non-medical: Not on file  Tobacco Use  . Smoking status: Never Smoker  . Smokeless tobacco: Never Used  Substance and Sexual Activity  . Alcohol use: Yes    Alcohol/week: 1.0 standard drinks    Types: 1 Glasses of wine per week    Comment: occasional wine or beer  . Drug use: No  . Sexual activity: Yes    Birth control/protection: None  Lifestyle  . Physical activity:    Days per week: Not on file    Minutes per session: Not on file  . Stress: Not on file  Relationships  . Social connections:    Talks on phone: Not on file    Gets together: Not on file    Attends religious service: Not on file    Active member of club or organization: Not on file    Attends meetings of clubs or organizations: Not on file    Relationship status: Not on file  Other Topics Concern  . Not on file  Social History Narrative  . Not on file    Outpatient Encounter Medications as of 07/11/2018  Medication Sig  . Acetaminophen (TYLENOL ARTHRITIS PAIN PO) Take 325 mg by mouth daily as needed (FOR MILD PAIN).   Marland Kitchen ALPRAZolam (XANAX) 0.5 MG tablet Take 1 tablet (0.5 mg total) by mouth 2 (two) times daily as needed for anxiety.  Marland Kitchen amitriptyline (ELAVIL) 50 MG tablet TAKE 1 TABLET(50 MG) BY MOUTH AT BEDTIME  . atorvastatin (LIPITOR) 20 MG tablet TAKE 1 TABLET(20 MG) BY MOUTH DAILY  . dicyclomine (BENTYL) 10 MG capsule Take 1 capsule (10 mg total) 3 (three) times daily before meals by mouth. For abdominal pain  . DIGOX 125 MCG tablet TAKE 1 TABLET BY MOUTH DAILY  . levothyroxine  (SYNTHROID, LEVOTHROID) 75 MCG tablet Take 1 tablet (75 mcg total) by mouth daily.  . metoprolol succinate (TOPROL-XL) 25 MG 24 hr tablet TAKE 1 TABLET(25 MG) BY MOUTH DAILY  . spironolactone (ALDACTONE) 25 MG tablet TAKE 1/2 TABLET(12.5 MG) BY MOUTH DAILY  . oseltamivir (TAMIFLU) 75 MG capsule Take 1 capsule (75 mg total) by mouth daily for 10 days.  . [DISCONTINUED] azithromycin (ZITHROMAX)  250 MG tablet Take 2 tabs today, then 1 tab daily for 4 days  . [DISCONTINUED] guaiFENesin-codeine 100-10 MG/5ML syrup Take 5 mLs by mouth every 6 (six) hours as needed for cough.  . [DISCONTINUED] predniSONE (DELTASONE) 20 MG tablet Take 3 tabs daily for 5 days   No facility-administered encounter medications on file as of 07/11/2018.     Activities of Daily Living In your present state of health, do you have any difficulty performing the following activities: 07/11/2018 05/31/2018  Hearing? N N  Vision? N N  Difficulty concentrating or making decisions? N N  Walking or climbing stairs? N N  Dressing or bathing? N N  Doing errands, shopping? N N  Preparing Food and eating ? N -  Using the Toilet? N -  In the past six months, have you accidently leaked urine? N -  Do you have problems with loss of bowel control? N -  Managing your Medications? N -  Managing your Finances? N -  Housekeeping or managing your Housekeeping? N -  Some recent data might be hidden    Patient Care Team: Midge Minium, MD as PCP - General (Family Medicine) Jacelyn Pi, MD as Consulting Physician (Endocrinology) Jola Schmidt, MD as Consulting Physician (Ophthalmology) [place], Peter M, MD as Consulting Physician (Cardiology) Tanda Rockers, MD as Consulting Physician (Pulmonary Disease)    Assessment:   This is a routine wellness examination for [place].  Exercise Activities and Dietary recommendations Current Exercise Habits: The patient does not participate in regular exercise at present(Walks dog and  at work), Exercise limited by: None identified   Diet (meal preparation, eat out, water intake, caffeinated beverages, dairy products, fruits and vegetables): Drinks diet pepsi and water.   Breakfast: muffin/bagel Lunch: Crackers; sandwich Dinner: soup/sandwich; protein and vegetables.   Goals    . patient     Stay active.     . Patient Stated     Attempt to decrease work load, relax.        Fall Risk Fall Risk  07/11/2018 05/31/2018 12/06/2017 09/05/2017 07/06/2017  Falls in the past year? 1 Yes Yes No Yes  Comment Losing balance; going up stairs - - - -  Number falls in past yr: 1 1 2  or more - 2 or more  Injury with Fall? 0 Yes No - No  Risk Factor Category  - - High Fall Risk - -  Risk for fall due to : Impaired balance/gait - - - -  Follow up Falls prevention discussed Falls prevention discussed Falls prevention discussed - Falls prevention discussed    Depression Screen PHQ 2/9 Scores 07/11/2018 05/31/2018 12/06/2017 09/05/2017  PHQ - 2 Score 0 0 0 0  PHQ- 9 Score - 0 1 0     Cognitive Function MMSE - Mini Mental State Exam 07/11/2018 07/06/2017  Orientation to time 5 5  Orientation to Place 5 5  Registration 3 3  Attention/ Calculation 5 5  Recall 2 3  Language- name 2 objects 2 2  Language- repeat 1 1  Language- follow 3 step command 3 3  Language- read & follow direction 1 1  Write a sentence 1 1  Copy design 1 1  Total score 29 30        Immunization History  Administered Date(s) Administered  . Influenza Whole 06/22/2009  . Influenza, High Dose Seasonal PF 07/06/2017  . Pneumococcal Conjugate-13 11/18/2015  . Pneumococcal Polysaccharide-23 08/23/2003, 07/06/2017  . Tdap 07/10/2012  Screening Tests Health Maintenance  Topic Date Due  . MAMMOGRAM  09/01/2016  . COLONOSCOPY  06/01/2018  . INFLUENZA VACCINE  11/21/2018 (Originally 03/22/2018)  . Hepatitis C Screening  07/12/2019 (Originally 01/05/1951)  . DEXA SCAN  03/16/2019  . TETANUS/TDAP   07/10/2022  . PNA vac Low Risk Adult  Completed        Plan:     Tamiflu Rx at pharmacy.  Schedule mammogram.   Bring a copy of your living will and/or healthcare power of attorney to your next office visit.  Continue doing brain stimulating activities (puzzles, reading, adult coloring books, staying active) to keep memory sharp.   I have personally reviewed and noted the following in the patient's chart:   . Medical and social history . Use of alcohol, tobacco or illicit drugs  . Current medications and supplements . Functional ability and status . Nutritional status . Physical activity . Advanced directives . List of other physicians . Hospitalizations, surgeries, and ER visits in previous 12 months . Vitals . Screenings to include cognitive, depression, and falls . Referrals and appointments  In addition, I have reviewed and discussed with patient certain preventive protocols, quality metrics, and best practice recommendations. A written personalized care plan for preventive services as well as general preventive health recommendations were provided to patient.     Gerilyn Nestle, RN  07/11/2018  PCP Notes: -Exposed to Flu on 07/10/2018, Tamiflu sent to pharmacy per your (PCP) request. -F/U with PCP on 08/01/18, will get Flu Vaccine at that time.   Reviewed documentation provided by RN and agree w/ above.  Tamiflu order came from me due to flu exposure.  Annye Asa, MD

## 2018-07-11 ENCOUNTER — Ambulatory Visit (INDEPENDENT_AMBULATORY_CARE_PROVIDER_SITE_OTHER): Payer: Medicare Other

## 2018-07-11 ENCOUNTER — Other Ambulatory Visit: Payer: Self-pay

## 2018-07-11 ENCOUNTER — Other Ambulatory Visit: Payer: Self-pay | Admitting: Gastroenterology

## 2018-07-11 VITALS — BP 110/60 | HR 59 | Ht 64.0 in | Wt 98.5 lb

## 2018-07-11 DIAGNOSIS — Z0000 Encounter for general adult medical examination without abnormal findings: Secondary | ICD-10-CM | POA: Diagnosis not present

## 2018-07-11 DIAGNOSIS — Z20828 Contact with and (suspected) exposure to other viral communicable diseases: Secondary | ICD-10-CM | POA: Diagnosis not present

## 2018-07-11 DIAGNOSIS — Z1239 Encounter for other screening for malignant neoplasm of breast: Secondary | ICD-10-CM

## 2018-07-11 DIAGNOSIS — Z23 Encounter for immunization: Secondary | ICD-10-CM

## 2018-07-11 MED ORDER — OSELTAMIVIR PHOSPHATE 75 MG PO CAPS: 75.0000 mg | ORAL_CAPSULE | Freq: Every day | ORAL | 0 refills | Status: AC

## 2018-07-11 NOTE — Patient Instructions (Addendum)
Tamiflu Rx at pharmacy, come in for Flu vaccine on 07/23/2018 @ 0900  Schedule mammogram.   Bring a copy of your living will and/or healthcare power of attorney to your next office visit.  Continue doing brain stimulating activities (puzzles, reading, adult coloring books, staying active) to keep memory sharp.   Health Maintenance, Female Adopting a healthy lifestyle and getting preventive care can go a long way to promote health and wellness. Talk with your health care provider about what schedule of regular examinations is right for you. This is a good chance for you to check in with your provider about disease prevention and staying healthy. In between checkups, there are plenty of things you can do on your own. Experts have done a lot of research about which lifestyle changes and preventive measures are most likely to keep you healthy. Ask your health care provider for more information. Weight and diet Eat a healthy diet  Be sure to include plenty of vegetables, fruits, low-fat dairy products, and lean protein.  Do not eat a lot of foods high in solid fats, added sugars, or salt.  Get regular exercise. This is one of the most important things you can do for your health. ? Most adults should exercise for at least 150 minutes each week. The exercise should increase your heart rate and make you sweat (moderate-intensity exercise). ? Most adults should also do strengthening exercises at least twice a week. This is in addition to the moderate-intensity exercise.  Maintain a healthy weight  Body mass index (BMI) is a measurement that can be used to identify possible weight problems. It estimates body fat based on height and weight. Your health care provider can help determine your BMI and help you achieve or maintain a healthy weight.  For females 43 years of age and older: ? A BMI below 18.5 is considered underweight. ? A BMI of 18.5 to 24.9 is normal. ? A BMI of 25 to 29.9 is considered  overweight. ? A BMI of 30 and above is considered obese.  Watch levels of cholesterol and blood lipids  You should start having your blood tested for lipids and cholesterol at 68 years of age, then have this test every 5 years.  You may need to have your cholesterol levels checked more often if: ? Your lipid or cholesterol levels are high. ? You are older than 68 years of age. ? You are at high risk for heart disease.  Cancer screening Lung Cancer  Lung cancer screening is recommended for adults 47-46 years old who are at high risk for lung cancer because of a history of smoking.  A yearly low-dose CT scan of the lungs is recommended for people who: ? Currently smoke. ? Have quit within the past 15 years. ? Have at least a 30-pack-year history of smoking. A pack year is smoking an average of one pack of cigarettes a day for 1 year.  Yearly screening should continue until it has been 15 years since you quit.  Yearly screening should stop if you develop a health problem that would prevent you from having lung cancer treatment.  Breast Cancer  Practice breast self-awareness. This means understanding how your breasts normally appear and feel.  It also means doing regular breast self-exams. Let your health care provider know about any changes, no matter how small.  If you are in your 20s or 30s, you should have a clinical breast exam (CBE) by a health care provider every 1-3 years  as part of a regular health exam.  If you are 54 or older, have a CBE every year. Also consider having a breast X-ray (mammogram) every year.  If you have a family history of breast cancer, talk to your health care provider about genetic screening.  If you are at high risk for breast cancer, talk to your health care provider about having an MRI and a mammogram every year.  Breast cancer gene (BRCA) assessment is recommended for women who have family members with BRCA-related cancers. BRCA-related cancers  include: ? Breast. ? Ovarian. ? Tubal. ? Peritoneal cancers.  Results of the assessment will determine the need for genetic counseling and BRCA1 and BRCA2 testing.  Cervical Cancer Your health care provider may recommend that you be screened regularly for cancer of the pelvic organs (ovaries, uterus, and vagina). This screening involves a pelvic examination, including checking for microscopic changes to the surface of your cervix (Pap test). You may be encouraged to have this screening done every 3 years, beginning at age 69.  For women ages 40-65, health care providers may recommend pelvic exams and Pap testing every 3 years, or they may recommend the Pap and pelvic exam, combined with testing for human papilloma virus (HPV), every 5 years. Some types of HPV increase your risk of cervical cancer. Testing for HPV may also be done on women of any age with unclear Pap test results.  Other health care providers may not recommend any screening for nonpregnant women who are considered low risk for pelvic cancer and who do not have symptoms. Ask your health care provider if a screening pelvic exam is right for you.  If you have had past treatment for cervical cancer or a condition that could lead to cancer, you need Pap tests and screening for cancer for at least 20 years after your treatment. If Pap tests have been discontinued, your risk factors (such as having a new sexual partner) need to be reassessed to determine if screening should resume. Some women have medical problems that increase the chance of getting cervical cancer. In these cases, your health care provider may recommend more frequent screening and Pap tests.  Colorectal Cancer  This type of cancer can be detected and often prevented.  Routine colorectal cancer screening usually begins at 67 years of age and continues through 68 years of age.  Your health care provider may recommend screening at an earlier age if you have risk factors  for colon cancer.  Your health care provider may also recommend using home test kits to check for hidden blood in the stool.  A small camera at the end of a tube can be used to examine your colon directly (sigmoidoscopy or colonoscopy). This is done to check for the earliest forms of colorectal cancer.  Routine screening usually begins at age 60.  Direct examination of the colon should be repeated every 5-10 years through 68 years of age. However, you may need to be screened more often if early forms of precancerous polyps or small growths are found.  Skin Cancer  Check your skin from head to toe regularly.  Tell your health care provider about any new moles or changes in moles, especially if there is a change in a mole's shape or color.  Also tell your health care provider if you have a mole that is larger than the size of a pencil eraser.  Always use sunscreen. Apply sunscreen liberally and repeatedly throughout the day.  Protect yourself by  wearing long sleeves, pants, a wide-brimmed hat, and sunglasses whenever you are outside.  Heart disease, diabetes, and high blood pressure  High blood pressure causes heart disease and increases the risk of stroke. High blood pressure is more likely to develop in: ? People who have blood pressure in the high end of the normal range (130-139/85-89 mm Hg). ? People who are overweight or obese. ? People who are African American.  If you are 48-51 years of age, have your blood pressure checked every 3-5 years. If you are 17 years of age or older, have your blood pressure checked every year. You should have your blood pressure measured twice-once when you are at a hospital or clinic, and once when you are not at a hospital or clinic. Record the average of the two measurements. To check your blood pressure when you are not at a hospital or clinic, you can use: ? An automated blood pressure machine at a pharmacy. ? A home blood pressure monitor.  If  you are between 67 years and 58 years old, ask your health care provider if you should take aspirin to prevent strokes.  Have regular diabetes screenings. This involves taking a blood sample to check your fasting blood sugar level. ? If you are at a normal weight and have a low risk for diabetes, have this test once every three years after 68 years of age. ? If you are overweight and have a high risk for diabetes, consider being tested at a younger age or more often. Preventing infection Hepatitis B  If you have a higher risk for hepatitis B, you should be screened for this virus. You are considered at high risk for hepatitis B if: ? You were born in a country where hepatitis B is common. Ask your health care provider which countries are considered high risk. ? Your parents were born in a high-risk country, and you have not been immunized against hepatitis B (hepatitis B vaccine). ? You have HIV or AIDS. ? You use needles to inject street drugs. ? You live with someone who has hepatitis B. ? You have had sex with someone who has hepatitis B. ? You get hemodialysis treatment. ? You take certain medicines for conditions, including cancer, organ transplantation, and autoimmune conditions.  Hepatitis C  Blood testing is recommended for: ? Everyone born from [birth year] through 1966. ? Anyone with known risk factors for hepatitis C.  Sexually transmitted infections (STIs)  You should be screened for sexually transmitted infections (STIs) including gonorrhea and chlamydia if: ? You are sexually active and are younger than 67 years of age. ? You are older than 68 years of age and your health care provider tells you that you are at risk for this type of infection. ? Your sexual activity has changed since you were last screened and you are at an increased risk for chlamydia or gonorrhea. Ask your health care provider if you are at risk.  If you do not have HIV, but are at risk, it may be recommended  that you take a prescription medicine daily to prevent HIV infection. This is called pre-exposure prophylaxis (PrEP). You are considered at risk if: ? You are sexually active and do not regularly use condoms or know the HIV status of your partner(s). ? You take drugs by injection. ? You are sexually active with a partner who has HIV.  Talk with your health care provider about whether you are at high risk of being infected with  HIV. If you choose to begin PrEP, you should first be tested for HIV. You should then be tested every 3 months for as long as you are taking PrEP. Pregnancy  If you are premenopausal and you may become pregnant, ask your health care provider about preconception counseling.  If you may become pregnant, take 400 to 800 micrograms (mcg) of folic acid every day.  If you want to prevent pregnancy, talk to your health care provider about birth control (contraception). Osteoporosis and menopause  Osteoporosis is a disease in which the bones lose minerals and strength with aging. This can result in serious bone fractures. Your risk for osteoporosis can be identified using a bone density scan.  If you are 41 years of age or older, or if you are at risk for osteoporosis and fractures, ask your health care provider if you should be screened.  Ask your health care provider whether you should take a calcium or vitamin D supplement to lower your risk for osteoporosis.  Menopause may have certain physical symptoms and risks.  Hormone replacement therapy may reduce some of these symptoms and risks. Talk to your health care provider about whether hormone replacement therapy is right for you. Follow these instructions at home:  Schedule regular health, dental, and eye exams.  Stay current with your immunizations.  Do not use any tobacco products including cigarettes, chewing tobacco, or electronic cigarettes.  If you are pregnant, do not drink alcohol.  If you are  breastfeeding, limit how much and how often you drink alcohol.  Limit alcohol intake to no more than 1 drink per day for nonpregnant women. One drink equals 12 ounces of beer, 5 ounces of wine, or 1 ounces of hard liquor.  Do not use street drugs.  Do not share needles.  Ask your health care provider for help if you need support or information about quitting drugs.  Tell your health care provider if you often feel depressed.  Tell your health care provider if you have ever been abused or do not feel safe at home. This information is not intended to replace advice given to you by your health care provider. Make sure you discuss any questions you have with your health care provider. Document Released: 02/21/2011 Document Revised: 01/14/2016 Document Reviewed: 05/12/2015 Elsevier Interactive Patient Education  [employer].  @ 11/19

## 2018-07-23 ENCOUNTER — Ambulatory Visit: Payer: Medicare Other

## 2018-08-01 ENCOUNTER — Ambulatory Visit: Payer: Self-pay

## 2018-08-01 ENCOUNTER — Ambulatory Visit (INDEPENDENT_AMBULATORY_CARE_PROVIDER_SITE_OTHER): Payer: Medicare Other | Admitting: Family Medicine

## 2018-08-01 ENCOUNTER — Telehealth: Payer: Self-pay

## 2018-08-01 ENCOUNTER — Other Ambulatory Visit: Payer: Self-pay

## 2018-08-01 ENCOUNTER — Encounter: Payer: Self-pay | Admitting: Family Medicine

## 2018-08-01 VITALS — BP 115/82 | HR 65 | Temp 97.9°F | Resp 16 | Ht 64.0 in | Wt 97.2 lb

## 2018-08-01 DIAGNOSIS — Z1231 Encounter for screening mammogram for malignant neoplasm of breast: Secondary | ICD-10-CM | POA: Diagnosis not present

## 2018-08-01 DIAGNOSIS — E039 Hypothyroidism, unspecified: Secondary | ICD-10-CM

## 2018-08-01 DIAGNOSIS — E7800 Pure hypercholesterolemia, unspecified: Secondary | ICD-10-CM

## 2018-08-01 DIAGNOSIS — E440 Moderate protein-calorie malnutrition: Secondary | ICD-10-CM | POA: Diagnosis not present

## 2018-08-01 DIAGNOSIS — Z23 Encounter for immunization: Secondary | ICD-10-CM | POA: Diagnosis not present

## 2018-08-01 DIAGNOSIS — I10 Essential (primary) hypertension: Secondary | ICD-10-CM | POA: Diagnosis not present

## 2018-08-01 DIAGNOSIS — R933 Abnormal findings on diagnostic imaging of other parts of digestive tract: Secondary | ICD-10-CM

## 2018-08-01 LAB — CBC WITH DIFFERENTIAL/PLATELET
BASOS PCT: 0.9 % (ref 0.0–3.0)
Basophils Absolute: 0.1 10*3/uL (ref 0.0–0.1)
EOS ABS: 0.1 10*3/uL (ref 0.0–0.7)
Eosinophils Relative: 1 % (ref 0.0–5.0)
HEMATOCRIT: 42.9 % (ref 36.0–46.0)
Hemoglobin: 14.5 g/dL (ref 12.0–15.0)
LYMPHS ABS: 1.7 10*3/uL (ref 0.7–4.0)
Lymphocytes Relative: 22 % (ref 12.0–46.0)
MCHC: 33.9 g/dL (ref 30.0–36.0)
MCV: 83.3 fl (ref 78.0–100.0)
MONOS PCT: 6 % (ref 3.0–12.0)
Monocytes Absolute: 0.5 10*3/uL (ref 0.1–1.0)
NEUTROS PCT: 70.1 % (ref 43.0–77.0)
Neutro Abs: 5.5 10*3/uL (ref 1.4–7.7)
Platelets: 331 10*3/uL (ref 150.0–400.0)
RBC: 5.14 Mil/uL — ABNORMAL HIGH (ref 3.87–5.11)
RDW: 13.4 % (ref 11.5–15.5)
WBC: 7.8 10*3/uL (ref 4.0–10.5)

## 2018-08-01 LAB — BASIC METABOLIC PANEL WITH GFR
BUN: 11 mg/dL (ref 6–23)
CO2: 26 meq/L (ref 19–32)
Calcium: 10.1 mg/dL (ref 8.4–10.5)
Chloride: 104 meq/L (ref 96–112)
Creatinine, Ser: 1.03 mg/dL (ref 0.40–1.20)
GFR: 56.49 mL/min — ABNORMAL LOW
Glucose, Bld: 95 mg/dL (ref 70–99)
Potassium: 2.8 meq/L — CL (ref 3.5–5.1)
Sodium: 138 meq/L (ref 135–145)

## 2018-08-01 LAB — HEPATIC FUNCTION PANEL
ALT: 13 U/L (ref 0–35)
AST: 20 U/L (ref 0–37)
Albumin: 4.3 g/dL (ref 3.5–5.2)
Alkaline Phosphatase: 75 U/L (ref 39–117)
BILIRUBIN TOTAL: 0.4 mg/dL (ref 0.2–1.2)
Bilirubin, Direct: 0.1 mg/dL (ref 0.0–0.3)
TOTAL PROTEIN: 7.1 g/dL (ref 6.0–8.3)

## 2018-08-01 LAB — LIPID PANEL
Cholesterol: 132 mg/dL (ref 0–200)
HDL: 37.1 mg/dL — ABNORMAL LOW (ref >39.00)
LDL Cholesterol: 60 mg/dL (ref 0–99)
NonHDL: 95.31
Total CHOL/HDL Ratio: 4
Triglycerides: 177 mg/dL — ABNORMAL HIGH (ref 0.0–149.0)
VLDL: 35.4 mg/dL (ref 0.0–40.0)

## 2018-08-01 LAB — TSH: TSH: 0.45 u[IU]/mL (ref 0.35–4.50)

## 2018-08-01 NOTE — Telephone Encounter (Signed)
Pt received the high dose flu vaccination this morning and is c/o swelling to the are and her arm is sore. Denies fever. Care advice given and pt verbalized understanding.  Reason for Disposition . Influenza (TIV; Injection) injected vaccine reactions  Answer Assessment - Initial Assessment Questions 1. SYMPTOMS: "What is the main symptom?" (e.g., redness, swelling, pain)      Swelling and hurts to move 2. ONSET: "When was the vaccine (shot) given?" "How much later did the __ begin?" (e.g., hours, days ago)      Today sx 1 hour after 3. SEVERITY: "How bad is it?"      Mild swelling moderate and pain moderate 4. FEVER: "Is there a fever?" If so, ask: "What is it, how was it measured, and when did it start?"      no 5. IMMUNIZATIONS GIVEN: "What shots have you recently received?"     High dose Flu shot 6. PAST REACTIONS: "Have you reacted to immunizations before?" If so, ask: "What happened?"     no 7. OTHER SYMPTOMS: "Do you have any other symptoms?"     no  Protocols used: IMMUNIZATION REACTIONS-A-AH

## 2018-08-01 NOTE — Progress Notes (Signed)
   Subjective:    Patient ID: Kaitlyn Adams, female    DOB: 1951/07/22, 67 y.o.   MRN: [id number]  HPI Hypothyroid- chronic problem, on Levothyroxine 72mcg daily.  No changes to skin/hair/nails  Hyperlipidemia- chronic problem, on Lipitor 20mg  daily.  No abd pain, N/V.  HTN- chronic problem, on Metoprolol 25mg  daily, Spironolactone 12.5mg  daily w/ good control.  No CP, SOB, HAs, visual changes, edema.  Protein Calorie Malnutrition- pt is down to 97 lbs.  She was 105 at her last Wellness Visit.  Pt reports she is not eating regularly b/c she doesn't have time at work  Health maintenance- needs mammo and colonoscopy (done at [facility])   Review of Systems For ROS see HPI     Objective:   Physical Exam  Constitutional: She is oriented to person, place, and time. She appears well-developed. No distress.  Thin, frail appearing  HENT:  Head: Normocephalic and atraumatic.  Eyes: Pupils are equal, round, and reactive to light. Conjunctivae and EOM are normal.  Neck: Normal range of motion. Neck supple. No thyromegaly present.  Cardiovascular: Normal rate, regular rhythm, normal heart sounds and intact distal pulses.  No murmur heard. Pulmonary/Chest: Effort normal and breath sounds normal. No respiratory distress.  Abdominal: Soft. She exhibits no distension. There is no tenderness.  Musculoskeletal: She exhibits no edema.  Lymphadenopathy:    She has no cervical adenopathy.  Neurological: She is alert and oriented to person, place, and time.  Skin: Skin is warm and dry.  Psychiatric: She has a normal mood and affect. Her behavior is normal.  Vitals reviewed.         Assessment & Plan:

## 2018-08-01 NOTE — Assessment & Plan Note (Signed)
Chronic problem.  Check TSH to determine if her weight loss is due to abnormal thyroid levels

## 2018-08-01 NOTE — Assessment & Plan Note (Signed)
Chronic problem.  Tolerating statin w/o difficulty.  Check labs.  Adjust meds prn  

## 2018-08-01 NOTE — Telephone Encounter (Signed)
[facility] called with critical: Potassium is 2.8

## 2018-08-01 NOTE — Patient Instructions (Signed)
Follow up in 6 months to recheck BP and cholesterol We'll notify you of your lab results and make any changes if needed Make sure you are eating regularly!  If you don't have time to eat, make sure you are drinking ENSURE or some other protein drink! We'll call you with your Mammogram and GI appt Call with any questions or concerns Happy Holidays!!!

## 2018-08-01 NOTE — Assessment & Plan Note (Signed)
Chronic problem.  Well controlled.  Asymptomatic.  Check labs.  No anticipated med changes.  Will follow. 

## 2018-08-01 NOTE — Assessment & Plan Note (Signed)
Pt continues to lose weight.  She is down to 97 lbs.  She always gives a reason for weight loss- recent stomach bug, no time to eat at work.  Stressed need for addition of protein shakes so that she can eat regularly.  Will follow.

## 2018-08-02 ENCOUNTER — Other Ambulatory Visit: Payer: Self-pay | Admitting: General Practice

## 2018-08-02 MED ORDER — POTASSIUM CHLORIDE CRYS ER 20 MEQ PO TBCR: 20.0000 meq | EXTENDED_RELEASE_TABLET | Freq: Every day | ORAL | 3 refills | Status: DC

## 2018-08-08 ENCOUNTER — Other Ambulatory Visit (INDEPENDENT_AMBULATORY_CARE_PROVIDER_SITE_OTHER): Payer: Medicare Other

## 2018-08-08 ENCOUNTER — Other Ambulatory Visit: Payer: Self-pay | Admitting: Family Medicine

## 2018-08-08 DIAGNOSIS — I10 Essential (primary) hypertension: Secondary | ICD-10-CM

## 2018-08-08 LAB — CBC WITH DIFFERENTIAL/PLATELET
Basophils Absolute: 0 10*3/uL (ref 0.0–0.1)
Basophils Relative: 0.6 % (ref 0.0–3.0)
Eosinophils Absolute: 0.1 10*3/uL (ref 0.0–0.7)
Eosinophils Relative: 1.4 % (ref 0.0–5.0)
HCT: 43.8 % (ref 36.0–46.0)
HEMOGLOBIN: 15 g/dL (ref 12.0–15.0)
Lymphocytes Relative: 23.2 % (ref 12.0–46.0)
Lymphs Abs: 1.7 10*3/uL (ref 0.7–4.0)
MCHC: 34.2 g/dL (ref 30.0–36.0)
MCV: 83.2 fl (ref 78.0–100.0)
Monocytes Absolute: 0.4 10*3/uL (ref 0.1–1.0)
Monocytes Relative: 5.7 % (ref 3.0–12.0)
Neutro Abs: 5.1 10*3/uL (ref 1.4–7.7)
Neutrophils Relative %: 69.1 % (ref 43.0–77.0)
Platelets: 286 10*3/uL (ref 150.0–400.0)
RBC: 5.27 Mil/uL — ABNORMAL HIGH (ref 3.87–5.11)
RDW: 13.7 % (ref 11.5–15.5)
WBC: 7.3 10*3/uL (ref 4.0–10.5)

## 2018-08-08 LAB — BASIC METABOLIC PANEL
BUN: 11 mg/dL (ref 6–23)
CALCIUM: 11.1 mg/dL — AB (ref 8.4–10.5)
CO2: 25 mEq/L (ref 19–32)
Chloride: 105 mEq/L (ref 96–112)
Creatinine, Ser: 1.1 mg/dL (ref 0.40–1.20)
GFR: 52.36 mL/min — ABNORMAL LOW (ref >60.00)
Glucose, Bld: 103 mg/dL — ABNORMAL HIGH (ref 70–99)
Potassium: 3.3 mEq/L — ABNORMAL LOW (ref 3.5–5.1)
Sodium: 140 mEq/L (ref 135–145)

## 2018-08-13 ENCOUNTER — Other Ambulatory Visit: Payer: Self-pay | Admitting: Gastroenterology

## 2018-08-17 ENCOUNTER — Other Ambulatory Visit: Payer: Self-pay

## 2018-08-17 ENCOUNTER — Encounter: Payer: Self-pay | Admitting: Physician Assistant

## 2018-08-17 ENCOUNTER — Other Ambulatory Visit: Payer: Medicare Other

## 2018-08-17 ENCOUNTER — Ambulatory Visit (INDEPENDENT_AMBULATORY_CARE_PROVIDER_SITE_OTHER): Payer: Medicare Other | Admitting: Physician Assistant

## 2018-08-17 VITALS — BP 116/78 | HR 98 | Temp 97.6°F | Resp 14 | Ht 64.0 in | Wt 99.0 lb

## 2018-08-17 DIAGNOSIS — M5431 Sciatica, right side: Secondary | ICD-10-CM

## 2018-08-17 LAB — BASIC METABOLIC PANEL
BUN: 11 mg/dL (ref 6–23)
CHLORIDE: 110 meq/L (ref 96–112)
CO2: 21 meq/L (ref 19–32)
Calcium: 9.3 mg/dL (ref 8.4–10.5)
Creatinine, Ser: 1.06 mg/dL (ref 0.40–1.20)
GFR: 54.64 mL/min — ABNORMAL LOW (ref >60.00)
Glucose, Bld: 77 mg/dL (ref 70–99)
Potassium: 3.3 mEq/L — ABNORMAL LOW (ref 3.5–5.1)
Sodium: 139 mEq/L (ref 135–145)

## 2018-08-17 MED ORDER — METHYLPREDNISOLONE 4 MG PO TBPK: ORAL_TABLET | ORAL | 0 refills | Status: DC

## 2018-08-17 NOTE — Progress Notes (Signed)
Patient presents to clinic today c/o right-sided low back pain with radiation into the RLE. Denies trauma or injury. Some heavy lifting the other day prior to symptom onset. Denies numbness or tingling of extremity. Denies saddle anesthesia or change to bowel or bladder habits. Has had the same symptoms before but has been > 1 year. Take has taken Tylenol arthritis for symptoms but with minimal relief. Has not taken anything today.  Past Medical History:  Diagnosis Date  . Anemia   . Asthma    - normal spirometry 2009 FEV1  >90% predicted.  - methacoline challenge neg 11/09  . Bronchitis    recurrent  . Cataract   . Chronic cough    - sinus CT 03-23-10 > neg.  - allergy profile 03-23-10 > nl, IgE 14.  - flutter valve rx 03-23-10  . Fibromyalgia   . GERD (gastroesophageal reflux disease)    exacerbating VCD  . Glaucoma   . Hyperlipidemia   . Hypertension   . Hyperthyroidism    with hot nodule.  - total thyroidectomy 11-05-08 benign -> subsequent hypothyroidism.  Marland Kitchen Hypokalemia   . Hypothyroidism   . Hypothyroidism    a. following thyroidectomy.  . Mild CAD    a. LHC 04/2015 - mild nonobstructive CAD with 30% dLAD, 40% D1, 25% pLCx, 30% mRCA, 30% RPDA, normal EF >65% with normal LVEDP.  . Orthostatic hypotension   . Osteoarthritis   . Paroxysmal atrial fibrillation (HCC)   . Renal insufficiency   . Vasomotor rhinitis    exacerbates VCD  . Vocal cord dysfunction    proven on FOB 9/09    Current Outpatient Medications on File Prior to Visit  Medication Sig Dispense Refill  . Acetaminophen (TYLENOL ARTHRITIS PAIN PO) Take 325 mg by mouth daily as needed (FOR MILD PAIN).     Marland Kitchen ALPRAZolam (XANAX) 0.5 MG tablet Take 1 tablet (0.5 mg total) by mouth 2 (two) times daily as needed for anxiety. 60 tablet 3  . amitriptyline (ELAVIL) 50 MG tablet TAKE 1 TABLET(50 MG) BY MOUTH AT BEDTIME 90 tablet 0  . atorvastatin (LIPITOR) 20 MG tablet TAKE 1 TABLET(20 MG) BY MOUTH DAILY 90 tablet 0  .  dicyclomine (BENTYL) 10 MG capsule TAKE 1 CAPSULE(10 MG) BY MOUTH THREE TIMES DAILY BEFORE MEALS FOR ABDOMINAL PAIN 90 capsule 0  . DIGOX 125 MCG tablet TAKE 1 TABLET BY MOUTH DAILY 90 tablet 3  . levothyroxine (SYNTHROID, LEVOTHROID) 75 MCG tablet Take 1 tablet (75 mcg total) by mouth daily. 90 tablet 1  . metoprolol succinate (TOPROL-XL) 25 MG 24 hr tablet TAKE 1 TABLET(25 MG) BY MOUTH DAILY 90 tablet 0  . potassium chloride SA (K-DUR,KLOR-CON) 20 MEQ tablet Take 1 tablet (20 mEq total) by mouth daily. 30 tablet 3  . spironolactone (ALDACTONE) 25 MG tablet TAKE 1/2 TABLET(12.5 MG) BY MOUTH DAILY 45 tablet 0   No current facility-administered medications on file prior to visit.     Allergies  Allergen Reactions  . Fenofibrate Other (See Comments)    GI side effects   . Erythromycin Nausea And Vomiting    REACTION: nausea and vomiting  . Guaifenesin Er Palpitations  . Sulfonamide Derivatives Nausea And Vomiting    REACTION: nausea and vomiting    Family History  Problem Relation Age of Onset  . Asthma Mother   . Heart disease Mother   . Emphysema Father   . Heart disease Father   . Irregular heart beat Brother   .  Asthma Grandchild     Social History   Socioeconomic History  . Marital status: Married    Spouse name: Not on file  . Number of children: 1  . Years of education: Not on file  . Highest education level: Not on file  Occupational History  . Occupation: owns a Scientist, research (physical sciences)  . Occupation: OWNER    Employer: [employer]  . Financial resource strain: Not on file  . Food insecurity:    Worry: Not on file    Inability: Not on file  . Transportation needs:    Medical: Not on file    Non-medical: Not on file  Tobacco Use  . Smoking status: Never Smoker  . Smokeless tobacco: Never Used  Substance and Sexual Activity  . Alcohol use: Yes    Alcohol/week: 1.0 standard drinks    Types: 1 Glasses of wine per week    Comment:  occasional wine or beer  . Drug use: No  . Sexual activity: Yes    Birth control/protection: None  Lifestyle  . Physical activity:    Days per week: Not on file    Minutes per session: Not on file  . Stress: Not on file  Relationships  . Social connections:    Talks on phone: Not on file    Gets together: Not on file    Attends religious service: Not on file    Active member of club or organization: Not on file    Attends meetings of clubs or organizations: Not on file    Relationship status: Not on file  Other Topics Concern  . Not on file  Social History Narrative  . Not on file   Review of Systems - See HPI.  All other ROS are negative.  BP 116/78   Pulse 98   Temp 97.6 F (36.4 C) (Oral)   Resp 14   Ht 5\' 4"  (1.626 m)   Wt 99 lb (44.9 kg)   SpO2 100%   BMI 16.99 kg/m   Physical Exam Vitals signs reviewed.  Constitutional:      Appearance: Normal appearance.  HENT:     Head: Normocephalic and atraumatic.  Neck:     Musculoskeletal: Neck supple.  Cardiovascular:     Rate and Rhythm: Normal rate and regular rhythm.     Heart sounds: Normal heart sounds.  Pulmonary:     Effort: Pulmonary effort is normal.     Breath sounds: Normal breath sounds.  Musculoskeletal:     Right hip: She exhibits decreased range of motion (decreased hip flexion 2/2 pain in back and leg.). She exhibits normal strength, no tenderness, no bony tenderness and no swelling.     Lumbar back: She exhibits tenderness and pain. She exhibits no bony tenderness and no spasm.  Neurological:     Mental Status: She is alert.     Recent Results (from the past 2160 hour(s))  Lipid panel     Status: Abnormal   Collection Time: 08/01/18 10:14 AM  Result Value Ref Range   Cholesterol 132 0 - 200 mg/dL    Comment: ATP III Classification       Desirable:  < 200 mg/dL               Borderline High:  200 - 239 mg/dL          High:  > = 240 mg/dL   Triglycerides 177.0 (H) 0.0 - 149.0 mg/dL  Comment: Normal:  <150 mg/dLBorderline High:  150 - 199 mg/dL   HDL 37.10 (L) >39.00 mg/dL   VLDL 35.4 0.0 - 40.0 mg/dL   LDL Cholesterol 60 0 - 99 mg/dL   Total CHOL/HDL Ratio 4     Comment:                Men          Women1/2 Average Risk     3.4          3.3Average Risk          5.0          4.42X Average Risk          9.6          7.13X Average Risk          15.0          11.0                       NonHDL 95.31     Comment: NOTE:  Non-HDL goal should be 30 mg/dL higher than patient's LDL goal (i.e. LDL goal of < 70 mg/dL, would have non-HDL goal of < 100 mg/dL)  Basic metabolic panel     Status: Abnormal   Collection Time: 08/01/18 10:14 AM  Result Value Ref Range   Sodium 138 135 - 145 mEq/L   Potassium 2.8 (LL) 3.5 - 5.1 mEq/L   Chloride 104 96 - 112 mEq/L   CO2 26 19 - 32 mEq/L   Glucose, Bld 95 70 - 99 mg/dL   BUN 11 6 - 23 mg/dL   Creatinine, Ser 1.03 0.40 - 1.20 mg/dL   Calcium 10.1 8.4 - 10.5 mg/dL   GFR 56.49 (L) >60.00 mL/min  Hepatic function panel     Status: None   Collection Time: 08/01/18 10:14 AM  Result Value Ref Range   Total Bilirubin 0.4 0.2 - 1.2 mg/dL   Bilirubin, Direct 0.1 0.0 - 0.3 mg/dL   Alkaline Phosphatase 75 39 - 117 U/L   AST 20 0 - 37 U/L   ALT 13 0 - 35 U/L   Total Protein 7.1 6.0 - 8.3 g/dL   Albumin 4.3 3.5 - 5.2 g/dL  TSH     Status: None   Collection Time: 08/01/18 10:14 AM  Result Value Ref Range   TSH 0.45 0.35 - 4.50 uIU/mL  CBC with Differential/Platelet     Status: Abnormal   Collection Time: 08/01/18 10:14 AM  Result Value Ref Range   WBC 7.8 4.0 - 10.5 K/uL   RBC 5.14 (H) 3.87 - 5.11 Mil/uL   Hemoglobin 14.5 12.0 - 15.0 g/dL   HCT 42.9 36.0 - 46.0 %   MCV 83.3 78.0 - 100.0 fl   MCHC 33.9 30.0 - 36.0 g/dL   RDW 13.4 11.5 - 15.5 %   Platelets 331.0 150.0 - 400.0 K/uL   Neutrophils Relative % 70.1 43.0 - 77.0 %   Lymphocytes Relative 22.0 12.0 - 46.0 %   Monocytes Relative 6.0 3.0 - 12.0 %   Eosinophils Relative 1.0 0.0 -  5.0 %   Basophils Relative 0.9 0.0 - 3.0 %   Neutro Abs 5.5 1.4 - 7.7 K/uL   Lymphs Abs 1.7 0.7 - 4.0 K/uL   Monocytes Absolute 0.5 0.1 - 1.0 K/uL   Eosinophils Absolute 0.1 0.0 - 0.7 K/uL   Basophils Absolute 0.1 0.0 - 0.1 K/uL  Basic Metabolic Panel (  BMET)     Status: Abnormal   Collection Time: 08/08/18  8:14 AM  Result Value Ref Range   Sodium 140 135 - 145 mEq/L   Potassium 3.3 (L) 3.5 - 5.1 mEq/L   Chloride 105 96 - 112 mEq/L   CO2 25 19 - 32 mEq/L   Glucose, Bld 103 (H) 70 - 99 mg/dL   BUN 11 6 - 23 mg/dL   Creatinine, Ser 1.10 0.40 - 1.20 mg/dL   Calcium 11.1 (H) 8.4 - 10.5 mg/dL   GFR 52.36 (L) >60.00 mL/min  CBC w/Diff     Status: Abnormal   Collection Time: 08/08/18  8:14 AM  Result Value Ref Range   WBC 7.3 4.0 - 10.5 K/uL   RBC 5.27 (H) 3.87 - 5.11 Mil/uL   Hemoglobin 15.0 12.0 - 15.0 g/dL   HCT 43.8 36.0 - 46.0 %   MCV 83.2 78.0 - 100.0 fl   MCHC 34.2 30.0 - 36.0 g/dL   RDW 13.7 11.5 - 15.5 %   Platelets 286.0 150.0 - 400.0 K/uL   Neutrophils Relative % 69.1 43.0 - 77.0 %   Lymphocytes Relative 23.2 12.0 - 46.0 %   Monocytes Relative 5.7 3.0 - 12.0 %   Eosinophils Relative 1.4 0.0 - 5.0 %   Basophils Relative 0.6 0.0 - 3.0 %   Neutro Abs 5.1 1.4 - 7.7 K/uL   Lymphs Abs 1.7 0.7 - 4.0 K/uL   Monocytes Absolute 0.4 0.1 - 1.0 K/uL   Eosinophils Absolute 0.1 0.0 - 0.7 K/uL   Basophils Absolute 0.0 0.0 - 0.1 K/uL    Assessment/Plan: 1. Sciatica of right side Start Medrol dose pack. Continue tylenol arthritis. Limit over exertion. No lifting. Supportive measures and other OTC medications reviewed. Follow-up if not resolving. - methylPREDNISolone (MEDROL DOSEPAK) 4 MG TBPK tablet; Take following package directions  Dispense: 21 tablet; Refill: 0  2. Serum calcium elevated Due for repeat labs Tuesday. Ok to have today to save her a trip. Orders already in place by PCP. - PTH, intact and calcium - Basic metabolic panel   Leeanne Rio, PA-C

## 2018-08-17 NOTE — Patient Instructions (Signed)
Please keep hydrated. Avoid heavy lifting or overexertion. Continue Tylenol for pain. Take the steroid taper as directed. This will help with pain and inflammation. Follow-up if symptoms are not resolving.    Sciatica  Sciatica is pain, numbness, weakness, or tingling along your sciatic nerve. The sciatic nerve starts in the lower back and goes down the back of each leg. Sciatica happens when this nerve is pinched or has pressure put on it. Sciatica usually goes away on its own or with treatment. Sometimes, sciatica may keep coming back (recur). Follow these instructions at home: Medicines  Take over-the-counter and prescription medicines only as told by your doctor.  Do not drive or use heavy machinery while taking prescription pain medicine. Managing pain  If directed, put ice on the affected area. ? Put ice in a plastic bag. ? Place a towel between your skin and the bag. ? Leave the ice on for 20 minutes, 2-3 times a day.  After icing, apply heat to the affected area before you exercise or as often as told by your doctor. Use the heat source that your doctor tells you to use, such as a moist heat pack or a heating pad. ? Place a towel between your skin and the heat source. ? Leave the heat on for 20-30 minutes. ? Remove the heat if your skin turns bright red. This is especially important if you are unable to feel pain, heat, or cold. You may have a greater risk of getting burned. Activity  Return to your normal activities as told by your doctor. Ask your doctor what activities are safe for you. ? Avoid activities that make your sciatica worse.  Take short rests during the day. Rest in a lying or standing position. This is usually better than sitting to rest. ? When you rest for a long time, do some physical activity or stretching between periods of rest. ? Avoid sitting for a long time without moving. Get up and move around at least one time each hour.  Exercise and stretch  regularly, as told by your doctor.  Do not lift anything that is heavier than 10 lb (4.5 kg) while you have symptoms of sciatica. ? Avoid lifting heavy things even when you do not have symptoms. ? Avoid lifting heavy things over and over.  When you lift objects, always lift in a way that is safe for your body. To do this, you should: ? Bend your knees. ? Keep the object close to your body. ? Avoid twisting. General instructions  Use good posture. ? Avoid leaning forward when you are sitting. ? Avoid hunching over when you are standing.  Stay at a healthy weight.  Wear comfortable shoes that support your feet. Avoid wearing high heels.  Avoid sleeping on a mattress that is too soft or too hard. You might have less pain if you sleep on a mattress that is firm enough to support your back.  Keep all follow-up visits as told by your doctor. This is important. Contact a doctor if:  You have pain that: ? Wakes you up when you are sleeping. ? Gets worse when you lie down. ? Is worse than the pain you have had in the past. ? Lasts longer than 4 weeks.  You lose weight for without trying. Get help right away if:  You cannot control when you pee (urinate) or poop (have a bowel movement).  You have weakness in any of these areas and it gets worse. ? Lower  back. ? Lower belly (pelvis). ? Butt (buttocks). ? Legs.  You have redness or swelling of your back.  You have a burning feeling when you pee. This information is not intended to replace advice given to you by your health care provider. Make sure you discuss any questions you have with your health care provider. Document Released: 05/17/2008 Document Revised: 01/14/2016 Document Reviewed: 04/17/2015 Elsevier Interactive Patient Education  2019 [employer].

## 2018-08-21 ENCOUNTER — Other Ambulatory Visit: Payer: Medicare Other

## 2018-08-23 ENCOUNTER — Other Ambulatory Visit: Payer: Self-pay | Admitting: *Deleted

## 2018-08-23 ENCOUNTER — Other Ambulatory Visit: Payer: Medicare Other

## 2018-08-23 DIAGNOSIS — E039 Hypothyroidism, unspecified: Secondary | ICD-10-CM

## 2018-08-23 LAB — TIQ-NTM

## 2018-08-23 LAB — PTH, INTACT AND CALCIUM: PTH: 5 pg/mL — ABNORMAL LOW (ref 14–64)

## 2018-08-24 LAB — PTH, INTACT AND CALCIUM
Calcium: 9.4 mg/dL (ref 8.6–10.4)
PTH: 13 pg/mL — ABNORMAL LOW (ref 14–64)

## 2018-08-27 ENCOUNTER — Encounter: Payer: Self-pay | Admitting: General Practice

## 2018-09-17 ENCOUNTER — Other Ambulatory Visit: Payer: Self-pay | Admitting: Gastroenterology

## 2018-09-17 ENCOUNTER — Other Ambulatory Visit: Payer: Self-pay | Admitting: Family Medicine

## 2018-09-17 DIAGNOSIS — E876 Hypokalemia: Secondary | ICD-10-CM

## 2018-09-26 DIAGNOSIS — Z1231 Encounter for screening mammogram for malignant neoplasm of breast: Secondary | ICD-10-CM | POA: Diagnosis not present

## 2018-09-26 LAB — HM MAMMOGRAPHY

## 2018-10-01 ENCOUNTER — Encounter: Payer: Self-pay | Admitting: Emergency Medicine

## 2018-10-17 ENCOUNTER — Other Ambulatory Visit: Payer: Self-pay | Admitting: Gastroenterology

## 2018-10-25 ENCOUNTER — Encounter: Payer: Self-pay | Admitting: Family Medicine

## 2018-10-25 ENCOUNTER — Ambulatory Visit (INDEPENDENT_AMBULATORY_CARE_PROVIDER_SITE_OTHER): Payer: Medicare Other | Admitting: Family Medicine

## 2018-10-25 ENCOUNTER — Encounter (HOSPITAL_COMMUNITY): Payer: Self-pay | Admitting: *Deleted

## 2018-10-25 ENCOUNTER — Observation Stay (HOSPITAL_COMMUNITY)
Admission: EM | Admit: 2018-10-25 | Discharge: 2018-10-26 | Disposition: A | Discharge status: Home / Self Care | Payer: Medicare Other | Attending: Family Medicine | Admitting: Family Medicine

## 2018-10-25 ENCOUNTER — Emergency Department (HOSPITAL_COMMUNITY): Payer: Medicare Other

## 2018-10-25 ENCOUNTER — Other Ambulatory Visit: Payer: Self-pay

## 2018-10-25 ENCOUNTER — Ambulatory Visit: Payer: Self-pay

## 2018-10-25 VITALS — BP 108/74 | HR 87 | Temp 98.0°F | Ht 64.0 in | Wt 96.0 lb

## 2018-10-25 DIAGNOSIS — R0602 Shortness of breath: Secondary | ICD-10-CM | POA: Diagnosis not present

## 2018-10-25 DIAGNOSIS — I4719 Other supraventricular tachycardia: Secondary | ICD-10-CM | POA: Diagnosis present

## 2018-10-25 DIAGNOSIS — I471 Supraventricular tachycardia: Secondary | ICD-10-CM | POA: Diagnosis not present

## 2018-10-25 DIAGNOSIS — E162 Hypoglycemia, unspecified: Secondary | ICD-10-CM

## 2018-10-25 DIAGNOSIS — E039 Hypothyroidism, unspecified: Secondary | ICD-10-CM | POA: Diagnosis not present

## 2018-10-25 DIAGNOSIS — R2681 Unsteadiness on feet: Secondary | ICD-10-CM | POA: Diagnosis not present

## 2018-10-25 DIAGNOSIS — E785 Hyperlipidemia, unspecified: Secondary | ICD-10-CM | POA: Diagnosis not present

## 2018-10-25 DIAGNOSIS — R42 Dizziness and giddiness: Secondary | ICD-10-CM | POA: Diagnosis not present

## 2018-10-25 DIAGNOSIS — E876 Hypokalemia: Secondary | ICD-10-CM | POA: Insufficient documentation

## 2018-10-25 DIAGNOSIS — I10 Essential (primary) hypertension: Secondary | ICD-10-CM | POA: Insufficient documentation

## 2018-10-25 DIAGNOSIS — I480 Paroxysmal atrial fibrillation: Secondary | ICD-10-CM | POA: Insufficient documentation

## 2018-10-25 DIAGNOSIS — J45909 Unspecified asthma, uncomplicated: Secondary | ICD-10-CM | POA: Insufficient documentation

## 2018-10-25 DIAGNOSIS — I951 Orthostatic hypotension: Principal | ICD-10-CM | POA: Insufficient documentation

## 2018-10-25 DIAGNOSIS — E440 Moderate protein-calorie malnutrition: Secondary | ICD-10-CM

## 2018-10-25 DIAGNOSIS — Z79899 Other long term (current) drug therapy: Secondary | ICD-10-CM | POA: Diagnosis not present

## 2018-10-25 LAB — URINALYSIS, ROUTINE W REFLEX MICROSCOPIC
Bilirubin Urine: NEGATIVE
Glucose, UA: NEGATIVE mg/dL
Hgb urine dipstick: NEGATIVE
Ketones, ur: 5 mg/dL — AB
Nitrite: NEGATIVE
Protein, ur: NEGATIVE mg/dL
Specific Gravity, Urine: 1.009 (ref 1.005–1.030)
pH: 6 (ref 5.0–8.0)

## 2018-10-25 LAB — CBC WITH DIFFERENTIAL/PLATELET
Abs Immature Granulocytes: 0.02 10*3/uL (ref 0.00–0.07)
BASOS ABS: 0.1 10*3/uL (ref 0.0–0.1)
BASOS PCT: 1 %
Eosinophils Absolute: 0.1 10*3/uL (ref 0.0–0.5)
Eosinophils Relative: 1 %
HCT: 47.5 % — ABNORMAL HIGH (ref 36.0–46.0)
Hemoglobin: 15.6 g/dL — ABNORMAL HIGH (ref 12.0–15.0)
Immature Granulocytes: 0 %
Lymphocytes Relative: 19 %
Lymphs Abs: 1.6 10*3/uL (ref 0.7–4.0)
MCH: 28.2 pg (ref 26.0–34.0)
MCHC: 32.8 g/dL (ref 30.0–36.0)
MCV: 85.7 fL (ref 80.0–100.0)
Monocytes Absolute: 0.6 10*3/uL (ref 0.1–1.0)
Monocytes Relative: 7 %
Neutro Abs: 6.1 10*3/uL (ref 1.7–7.7)
Neutrophils Relative %: 72 %
Platelets: 281 10*3/uL (ref 150–400)
RBC: 5.54 MIL/uL — ABNORMAL HIGH (ref 3.87–5.11)
RDW: 13.1 % (ref 11.5–15.5)
WBC: 8.5 10*3/uL (ref 4.0–10.5)
nRBC: 0 % (ref 0.0–0.2)

## 2018-10-25 LAB — COMPREHENSIVE METABOLIC PANEL
ALBUMIN: 4.5 g/dL (ref 3.5–5.0)
ALT: 14 U/L (ref 0–44)
AST: 21 U/L (ref 15–41)
Alkaline Phosphatase: 81 U/L (ref 38–126)
Anion gap: 11 (ref 5–15)
BUN: 9 mg/dL (ref 8–23)
CO2: 25 mmol/L (ref 22–32)
Calcium: 12.3 mg/dL — ABNORMAL HIGH (ref 8.9–10.3)
Chloride: 103 mmol/L (ref 98–111)
Creatinine, Ser: 1.22 mg/dL — ABNORMAL HIGH (ref 0.44–1.00)
GFR calc Af Amer: 52 mL/min — ABNORMAL LOW (ref >60)
GFR calc non Af Amer: 45 mL/min — ABNORMAL LOW (ref >60)
Glucose, Bld: 95 mg/dL (ref 70–99)
Potassium: 2.3 mmol/L — CL (ref 3.5–5.1)
Sodium: 139 mmol/L (ref 135–145)
Total Bilirubin: 0.9 mg/dL (ref 0.3–1.2)
Total Protein: 7.4 g/dL (ref 6.5–8.1)

## 2018-10-25 LAB — POCT I-STAT EG7
Acid-Base Excess: 3 mmol/L — ABNORMAL HIGH (ref 0.0–2.0)
Bicarbonate: 29 mmol/L — ABNORMAL HIGH (ref 20.0–28.0)
Calcium, Ion: 1.56 mmol/L (ref 1.15–1.40)
HCT: 45 % (ref 36.0–46.0)
Hemoglobin: 15.3 g/dL — ABNORMAL HIGH (ref 12.0–15.0)
O2 Saturation: 54 %
PH VEN: 7.392 (ref 7.250–7.430)
PO2 VEN: 29 mmHg — AB (ref 32.0–45.0)
Potassium: 2.5 mmol/L — CL (ref 3.5–5.1)
Sodium: 140 mmol/L (ref 135–145)
TCO2: 30 mmol/L (ref 22–32)
pCO2, Ven: 47.6 mmHg (ref 44.0–60.0)

## 2018-10-25 LAB — DIGOXIN LEVEL: Digoxin Level: 0.8 ng/mL (ref 0.8–2.0)

## 2018-10-25 LAB — I-STAT TROPONIN, ED: Troponin i, poc: 0 ng/mL (ref 0.00–0.08)

## 2018-10-25 LAB — GLUCOSE, CAPILLARY
Glucose-Capillary: 73 mg/dL (ref 70–99)
Glucose-Capillary: 95 mg/dL (ref 70–99)

## 2018-10-25 LAB — CBG MONITORING, ED
GLUCOSE-CAPILLARY: 57 mg/dL — AB (ref 70–99)
Glucose-Capillary: 61 mg/dL — ABNORMAL LOW (ref 70–99)
Glucose-Capillary: 74 mg/dL (ref 70–99)

## 2018-10-25 LAB — MAGNESIUM: MAGNESIUM: 1.7 mg/dL (ref 1.7–2.4)

## 2018-10-25 MED ORDER — SIMETHICONE 80 MG PO CHEW
160.0000 mg | CHEWABLE_TABLET | Freq: Four times a day (QID) | ORAL | Status: DC | PRN
  Administered 2018-10-25: 160 mg via ORAL
  Filled 2018-10-25: qty 2

## 2018-10-25 MED ORDER — LEVOTHYROXINE SODIUM 50 MCG PO TABS
75.0000 ug | ORAL_TABLET | Freq: Every day | ORAL | Status: DC
  Filled 2018-10-25: qty 1

## 2018-10-25 MED ORDER — POTASSIUM CHLORIDE 20 MEQ/15ML (10%) PO SOLN
40.0000 meq | ORAL | Status: AC
  Administered 2018-10-25 (×3): 40 meq via ORAL
  Filled 2018-10-25 (×3): qty 30

## 2018-10-25 MED ORDER — SODIUM CHLORIDE 0.9 % IV BOLUS
1000.0000 mL | Freq: Once | INTRAVENOUS | Status: AC
  Administered 2018-10-25: 1000 mL via INTRAVENOUS

## 2018-10-25 MED ORDER — ACETAMINOPHEN 325 MG PO TABS: 650.0000 mg | ORAL_TABLET | Freq: Four times a day (QID) | ORAL | Status: DC | PRN

## 2018-10-25 MED ORDER — POTASSIUM CHLORIDE 10 MEQ/100ML IV SOLN: 10.0000 meq | INTRAVENOUS | Status: DC

## 2018-10-25 MED ORDER — METOPROLOL SUCCINATE ER 25 MG PO TB24: 25.0000 mg | ORAL_TABLET | Freq: Every day | ORAL | Status: DC

## 2018-10-25 MED ORDER — SODIUM CHLORIDE 0.9 % IV SOLN
INTRAVENOUS | Status: AC
  Administered 2018-10-25: 18:00:00 via INTRAVENOUS

## 2018-10-25 MED ORDER — ACETAMINOPHEN 650 MG RE SUPP: 650.0000 mg | Freq: Four times a day (QID) | RECTAL | Status: DC | PRN

## 2018-10-25 MED ORDER — DIGOXIN 125 MCG PO TABS
0.1250 mg | ORAL_TABLET | Freq: Every day | ORAL | Status: DC
  Administered 2018-10-26: 0.125 mg via ORAL
  Filled 2018-10-25: qty 1

## 2018-10-25 MED ORDER — ENOXAPARIN SODIUM 30 MG/0.3ML ~~LOC~~ SOLN
30.0000 mg | SUBCUTANEOUS | Status: DC
  Administered 2018-10-25: 30 mg via SUBCUTANEOUS
  Filled 2018-10-25: qty 0.3

## 2018-10-25 MED ORDER — DICYCLOMINE HCL 10 MG PO CAPS
10.0000 mg | ORAL_CAPSULE | Freq: Three times a day (TID) | ORAL | Status: DC
  Administered 2018-10-25 – 2018-10-26 (×2): 10 mg via ORAL
  Filled 2018-10-25 (×3): qty 1

## 2018-10-25 MED ORDER — BISACODYL 5 MG PO TBEC: 5.0000 mg | DELAYED_RELEASE_TABLET | Freq: Three times a day (TID) | ORAL | Status: DC | PRN

## 2018-10-25 MED ORDER — ATORVASTATIN CALCIUM 20 MG PO TABS
20.0000 mg | ORAL_TABLET | Freq: Every day | ORAL | Status: DC
  Administered 2018-10-25: 20 mg via ORAL
  Filled 2018-10-25: qty 1

## 2018-10-25 MED ORDER — SODIUM CHLORIDE 0.9% FLUSH: 3.0000 mL | Freq: Once | INTRAVENOUS | Status: DC

## 2018-10-25 MED ORDER — AMITRIPTYLINE HCL 25 MG PO TABS
50.0000 mg | ORAL_TABLET | Freq: Every day | ORAL | Status: DC
  Administered 2018-10-25: 50 mg via ORAL
  Filled 2018-10-25: qty 2

## 2018-10-25 NOTE — H&P (Signed)
History and Physical    Kaitlyn Adams LPF:[id number] DOB: [birth year] DOA: 10/25/2018  PCP: Midge Minium, MD Patient coming from: Home  Chief Complaint: Dizziness  HPI: Kaitlyn Adams is a 68 y.o. female with medical history significant of paroxysmal atrial fibrillation, PAT, hypothyroidism, hyperlipidemia, essential hypertension, hypokalemia. Patient started having lightheadedness starting about three days ago. Symptoms initially improved with time and she tried to go to work, which exacerbated her symptoms, requiring her to hold onto furniture to get around. Symptoms persisted today and patient went to her PCPs office for evaluation. From there she was sent to the ED for evaluation. She has been drinking diet pepsi but not much water. She says her husband does not think she eats much. She has not been taking her potassium supplements. She has been taking her metoprolol and spironolactone (she is unsure of why she takes the spironolactone).   ED Course: Vitals: Afebrile, normal pulse, normal respirations, on room air, orthostatic Labs: Potassium of 2.3, creatinine of 1.22, calcium of 12.3, hemoglobin of 15.6 Imaging: Chest x-ray unremarkable Medications/Course: NS 1L bolus  Review of Systems: Review of Systems  Constitutional: Negative for chills and fever.  Respiratory: Negative for shortness of breath.   Cardiovascular: Negative for chest pain.  Gastrointestinal: Negative for abdominal pain, constipation, diarrhea, nausea and vomiting.  Neurological: Negative for loss of consciousness.  All other systems reviewed and are negative.   Past Medical History:  Diagnosis Date  . Anemia   . Asthma    - normal spirometry 2009 FEV1  >90% predicted.  - methacoline challenge neg 11/09  . Bronchitis    recurrent  . Cataract   . Chronic cough    - sinus CT 03-23-10 > neg.  - allergy profile 03-23-10 > nl, IgE 14.  - flutter valve rx 03-23-10  . Fibromyalgia   . GERD  (gastroesophageal reflux disease)    exacerbating VCD  . Glaucoma   . Hyperlipidemia   . Hypertension   . Hyperthyroidism    with hot nodule.  - total thyroidectomy 11-05-08 benign -> subsequent hypothyroidism.  Marland Kitchen Hypokalemia   . Hypothyroidism   . Hypothyroidism    a. following thyroidectomy.  . Mild CAD    a. LHC 04/2015 - mild nonobstructive CAD with 30% dLAD, 40% D1, 25% pLCx, 30% mRCA, 30% RPDA, normal EF >65% with normal LVEDP.  . Orthostatic hypotension   . Osteoarthritis   . Paroxysmal atrial fibrillation (HCC)   . Renal insufficiency   . Vasomotor rhinitis    exacerbates VCD  . Vocal cord dysfunction    proven on FOB 9/09    Past Surgical History:  Procedure Laterality Date  . ABDOMINAL HYSTERECTOMY  1980  . ABDOMINAL HYSTERECTOMY    . BASAL CELL CARCINOMA EXCISION  11/08  . CARDIAC CATHETERIZATION N/A 05/15/2015   Procedure: Left Heart Cath and Coronary Angiography;  Surgeon: Sherren Mocha, MD;  Location: [facility];  Service: Cardiovascular;  Laterality: N/A;  . COLONOSCOPY N/A 04/27/2017   Dr Silverio Decamp for scant rectal bleeding and iron def anemia: Melanosis coli, lipomatous IC valve, left sided tics, non-bleeding hemorrhoid, 10 mm transverse polyp.    . ESOPHAGOGASTRODUODENOSCOPY N/A 04/27/2017   Mauri Pole, MD; [facility]. for iron def anemia.  Monilial/candidial esophagitis. Small HH.    . EYE SURGERY    . scar tissue removal  1982  . THYROID SURGERY  2010  . THYROIDECTOMY  11-05-08     reports that she has  never smoked. She has never used smokeless tobacco. She reports current alcohol use of about 1.0 standard drinks of alcohol per week. She reports that she does not use drugs.  Allergies  Allergen Reactions  . Fenofibrate Other (See Comments)    GI side effects   . Erythromycin Nausea And Vomiting    REACTION: nausea and vomiting  . Guaifenesin Er Palpitations  . Sulfonamide Derivatives Nausea And Vomiting    REACTION: nausea and vomiting     Family History  Problem Relation Age of Onset  . Asthma Mother   . Heart disease Mother   . Emphysema Father   . Heart disease Father   . Irregular heart beat Brother   . Asthma Grandchild     Prior to Admission medications   Medication Sig Start Date End Date Taking? Authorizing Provider  Acetaminophen (TYLENOL ARTHRITIS PAIN PO) Take 325 mg by mouth daily as needed (FOR MILD PAIN).    Yes [provider]  amitriptyline (ELAVIL) 50 MG tablet TAKE 1 TABLET(50 MG) BY MOUTH AT BEDTIME Patient taking differently: Take 50 mg by mouth at bedtime.  09/17/18  Yes Midge Minium, MD  atorvastatin (LIPITOR) 20 MG tablet TAKE 1 TABLET(20 MG) BY MOUTH DAILY Patient taking differently: Take 20 mg by mouth daily at 6 PM.  09/17/18  Yes Midge Minium, MD  bisacodyl (DULCOLAX) 5 MG EC tablet Take 5 mg by mouth 3 (three) times daily as needed for moderate constipation.   Yes [provider]  dicyclomine (BENTYL) 10 MG capsule TAKE 1 CAPSULE(10 MG) BY MOUTH THREE TIMES DAILY BEFORE MEALS FOR ABDOMINAL PAIN Patient taking differently: Take 10 mg by mouth 3 (three) times daily before meals.  10/17/18  Yes Ladene Artist, MD  DIGOX 125 MCG tablet TAKE 1 TABLET BY MOUTH DAILY Patient taking differently: Take 0.125 mg by mouth daily.  12/18/17  Yes Martinique, Peter M, MD  hydroxypropyl methylcellulose / hypromellose (ISOPTO TEARS / GONIOVISC) 2.5 % ophthalmic solution Place 2 drops into both eyes daily as needed for dry eyes.   Yes [provider]  levothyroxine (SYNTHROID, LEVOTHROID) 75 MCG tablet Take 1 tablet (75 mcg total) by mouth daily. 11/19/15  Yes Midge Minium, MD  metoprolol succinate (TOPROL-XL) 25 MG 24 hr tablet TAKE 1 TABLET(25 MG) BY MOUTH DAILY Patient taking differently: Take 25 mg by mouth daily. TAKE 1 TABLET(25 MG) BY MOUTH DAILY 09/17/18  Yes Midge Minium, MD  simethicone (MYLICON) 80 MG chewable tablet Chew 160 mg by mouth every 6 (six)  hours as needed for flatulence.   Yes [provider]  spironolactone (ALDACTONE) 25 MG tablet TAKE 1/2 TABLET(12.5 MG) BY MOUTH DAILY Patient taking differently: Take 12.5 mg by mouth daily.  09/17/18  Yes Midge Minium, MD  ALPRAZolam Duanne Moron) 0.5 MG tablet Take 1 tablet (0.5 mg total) by mouth 2 (two) times daily as needed for anxiety. Patient not taking: Reported on 10/25/2018 12/07/17   Midge Minium, MD  methylPREDNISolone (MEDROL DOSEPAK) 4 MG TBPK tablet Take following package directions Patient not taking: Reported on 10/25/2018 08/17/18   Brunetta Jeans, PA-C  potassium chloride SA (K-DUR,KLOR-CON) 20 MEQ tablet Take 1 tablet (20 mEq total) by mouth daily. Patient not taking: Reported on 10/25/2018 08/02/18   Midge Minium, MD    Physical Exam:  Physical Exam Vitals signs and nursing note reviewed.  Constitutional:      General: She is not in acute distress.  Appearance: She is well-developed. She is not diaphoretic.  HENT:     Mouth/Throat:     Mouth: Mucous membranes are moist.  Eyes:     Conjunctiva/sclera: Conjunctivae normal.     Pupils: Pupils are equal, round, and reactive to light.  Neck:     Musculoskeletal: Normal range of motion.  Cardiovascular:     Rate and Rhythm: Normal rate and regular rhythm.     Heart sounds: Murmur present.  Pulmonary:     Effort: Pulmonary effort is normal. No respiratory distress.     Breath sounds: Normal breath sounds. No wheezing or rales.  Abdominal:     General: Bowel sounds are normal. There is no distension.     Palpations: Abdomen is soft.     Tenderness: There is no abdominal tenderness. There is no guarding or rebound.  Musculoskeletal: Normal range of motion.        General: No tenderness.     Right lower leg: No edema.     Left lower leg: No edema.  Lymphadenopathy:     Cervical: No cervical adenopathy.  Skin:    General: Skin is warm and dry.  Neurological:     General: No focal deficit  present.     Mental Status: She is alert and oriented to person, place, and time.  Psychiatric:        Mood and Affect: Mood normal.     Labs on Admission: I have personally reviewed following labs and imaging studies  CBC: Recent Labs  Lab 10/25/18 1253 10/25/18 1300  WBC 8.5  --   NEUTROABS 6.1  --   HGB 15.6* 15.3*  HCT 47.5* 45.0  MCV 85.7  --   PLT 281  --     Basic Metabolic Panel: Recent Labs  Lab 10/25/18 1253 10/25/18 1300  NA 139 140  K 2.3* 2.5*  CL 103  --   CO2 25  --   GLUCOSE 95  --   BUN 9  --   CREATININE 1.22*  --   CALCIUM 12.3*  --   MG 1.7  --     GFR: Estimated Creatinine Clearance: 30 mL/min (A) (by C-G formula based on SCr of 1.22 mg/dL (H)).  Liver Function Tests: Recent Labs  Lab 10/25/18 1253  AST 21  ALT 14  ALKPHOS 81  BILITOT 0.9  PROT 7.4  ALBUMIN 4.5   No results for input(s): LIPASE, AMYLASE in the last 168 hours. No results for input(s): AMMONIA in the last 168 hours.  Coagulation Profile: No results for input(s): INR, PROTIME in the last 168 hours.  Cardiac Enzymes: No results for input(s): CKTOTAL, CKMB, CKMBINDEX, TROPONINI in the last 168 hours.  BNP (last 3 results) No results for input(s): PROBNP in the last 8760 hours.  HbA1C: No results for input(s): HGBA1C in the last 72 hours.  CBG: Recent Labs  Lab 10/25/18 1328 10/25/18 1406 10/25/18 1411  GLUCAP 57* 61* 74    Lipid Profile: No results for input(s): CHOL, HDL, LDLCALC, TRIG, CHOLHDL, LDLDIRECT in the last 72 hours.  Thyroid Function Tests: No results for input(s): TSH, T4TOTAL, FREET4, T3FREE, THYROIDAB in the last 72 hours.  Anemia Panel: No results for input(s): VITAMINB12, FOLATE, FERRITIN, TIBC, IRON, RETICCTPCT in the last 72 hours.  Urine analysis:    Component Value Date/Time   COLORURINE YELLOW 10/25/2018 1253   APPEARANCEUR CLEAR 10/25/2018 1253   LABSPEC 1.009 10/25/2018 1253   PHURINE 6.0 10/25/2018 1253  GLUCOSEU  NEGATIVE 10/25/2018 [place] 10/25/2018 [place] 10/25/2018 1253   KETONESUR 5 (A) 10/25/2018 1253   PROTEINUR NEGATIVE 10/25/2018 1253   UROBILINOGEN 1.0 04/01/2009 1425   NITRITE NEGATIVE 10/25/2018 1253   LEUKOCYTESUR LARGE (A) 10/25/2018 1253     Radiological Exams on Admission: Ct Head Wo Contrast  Result Date: 10/25/2018 CLINICAL DATA:  Dizziness and weakness, progressively worsening for 3 days. EXAM: CT HEAD WITHOUT CONTRAST TECHNIQUE: Contiguous axial images were obtained from the base of the skull through the vertex without intravenous contrast. COMPARISON:  Head CT dated 04/02/2009 FINDINGS: Brain: Minimal chronic small vessel ischemic change within the deep periventricular white matter regions bilaterally. No mass, hemorrhage, edema or other evidence of acute parenchymal abnormality. No extra-axial hemorrhage. Vascular: Chronic calcified atherosclerotic changes of the large vessels at the skull base. No unexpected hyperdense vessel. Skull: Normal. Negative for fracture or focal lesion. Sinuses/Orbits: No acute finding. Other: None. IMPRESSION: No acute findings. No intracranial mass, hemorrhage or edema. Electronically Signed   By: Franki Cabot M.D.   On: 10/25/2018 13:28   Dg Chest Port 1 View  Result Date: 10/25/2018 CLINICAL DATA:  Dizziness, weakness, shortness of breath, progressive symptoms for 3 days, history asthma, GERD, hypertension, coronary artery disease, atrial fibrillation EXAM: PORTABLE CHEST 1 VIEW COMPARISON:  Portable exam 1223 hours compared to 04/25/2017 FINDINGS: Normal heart size, mediastinal contours, and pulmonary vascularity. Lungs clear. No infiltrate, pleural effusion, or pneumothorax. Bones appear demineralized with mild scattered endplate spur formation thoracic spine. IMPRESSION: No acute abnormalities. Electronically Signed   By: Lavonia Dana M.D.   On: 10/25/2018 13:08    EKG: Independently reviewed. ST depression in  lateral leads, unchanged from previous. Sinus rhythm.  Assessment/Plan Active Problems:   Essential hypertension   Paroxysmal atrial fibrillation (HCC)   Hypothyroidism   Orthostatic hypotension   Hyperlipidemia   PAT (paroxysmal atrial tachycardia) (HCC)   Hypokalemia   Hypokalemia Chronic issue and patient has not been taking her potassium supplementation. No associated sodium abnormality. -Potassium 40 meq x3 -Recheck BMP in AM; reassess supplementation at that time  Orthostatic hypotension Likely secondary to dehydration. Patient is on metoprolol and spironolactone with no recent medication changes. She also had hypoglycemia while in the ED. -IV fluids -Orthostatic vitals in AM  Hypoglycemia Unknown etiology. Patient is not on hypoglycemic medications. -AM cortisol -CBG checks qAC and HS  Paroxysmal atrial fibrillation Paroxysmal atrial tachycardia Rate controlled. Not on anticoagulation secondary to history of GI bleed -Continue Digoxin, metoprolol  Hypothyroidism -Continue Synthroid  Hypercalcemia Appears to be a chronic issue being worked up by PCP. Low PTH on previous workup. May be contributing to fatigue. -IV fluids  Hyperlipidemia -Continue Lipitor  Nonobstructive CAD Normal EF. Follows with Dr. Martinique  Essential hypertension Patient is on metoprolol and spironolactone. -Continue metoprolol -Hold spironolactone for now   DVT prophylaxis: Lovenox Code Status: DNR Family Communication: None at bedside Disposition Plan: medical floor Consults called: None Admission status: Observation   Cordelia Poche, MD [facility] 10/25/2018, 2:26 PM  If 7PM-7AM, please contact night-coverage [email or web address] Password [email or web address]

## 2018-10-25 NOTE — ED Notes (Signed)
Dr Darl Householder notified of patient's I-STAT 7 result.

## 2018-10-25 NOTE — Telephone Encounter (Signed)
Because of so many chronic issuses going on with this patient, I have contacted patient to suggest ED would be a better visit for her today---we just don't have all the necessary equipment/testing/imaging that's probably going to be required to treat her today, and I didn't want her to pay office visit copay just to be sent to ED---I really don't think sinus issues is causing symptoms she is reporting---and patient states she has not been taking potassium for the last month, which probably needs to have further evaluation at this point---patient is adamant about coming to office visit, she also placed husband on phone for me to explain to him, and she still insists that she only needs office visit, she does not need to go to ED today---we are keeping office visit appt as scheduled with the understanding that patient may still need to go to ED for proper treatment.

## 2018-10-25 NOTE — Telephone Encounter (Signed)
No new dictation needed

## 2018-10-25 NOTE — ED Provider Notes (Signed)
[facility] DEPT Provider Note   CSN: [id number] Arrival date & time: 10/25/18  1154    History   Chief Complaint Chief Complaint  Patient presents with  . Dizziness  . Weakness    HPI WILETTA BERMINGHAM is a 68 y.o. female history of cholesterol, hypertension, CAD, hypokalemia, here presenting with dizziness, weakness.  Patient states that she has not been feeling well for the last week or so.  She states that every time she stands up, she feels lightheaded and dizzy.  She stopped taking her potassium supplementation about a month ago since he was hard to swallow the pills.  Patient states that over the last 3 days, she had a hard time standing up because she feels like she is on pass out every time she stands up.  Denies any vertiginous symptoms or chest pain or shortness of breath.  Patient went to primary care doctor this morning and was noted to be orthostatic and was concerned for hypokalemia so was sent for evaluation.      The history is provided by the patient.    Past Medical History:  Diagnosis Date  . Anemia   . Asthma    - normal spirometry 2009 FEV1  >90% predicted.  - methacoline challenge neg 11/09  . Bronchitis    recurrent  . Cataract   . Chronic cough    - sinus CT 03-23-10 > neg.  - allergy profile 03-23-10 > nl, IgE 14.  - flutter valve rx 03-23-10  . Fibromyalgia   . GERD (gastroesophageal reflux disease)    exacerbating VCD  . Glaucoma   . Hyperlipidemia   . Hypertension   . Hyperthyroidism    with hot nodule.  - total thyroidectomy 11-05-08 benign -> subsequent hypothyroidism.  Marland Kitchen Hypokalemia   . Hypothyroidism   . Hypothyroidism    a. following thyroidectomy.  . Mild CAD    a. LHC 04/2015 - mild nonobstructive CAD with 30% dLAD, 40% D1, 25% pLCx, 30% mRCA, 30% RPDA, normal EF >65% with normal LVEDP.  . Orthostatic hypotension   . Osteoarthritis   . Paroxysmal atrial fibrillation (HCC)   . Renal insufficiency   .  Vasomotor rhinitis    exacerbates VCD  . Vocal cord dysfunction    proven on FOB 9/09    Patient Active Problem List   Diagnosis Date Noted  . Moderate protein-calorie malnutrition ([place]) 08/01/2018  . Upper airway cough syndrome 05/22/2017  . Symptomatic anemia 04/26/2017  . Chronic constipation 07/07/2016  . History of colonic polyps 07/07/2016  . Chronic anticoagulation 07/07/2016  . Iron deficiency anemia 06/15/2016  . Anxiety and depression 05/18/2016  . Physical exam 11/18/2015  . Pain in joint, ankle and foot 11/16/2015  . PAT (paroxysmal atrial tachycardia) ([place]) 09/08/2015  . Hyperlipidemia 07/30/2015  . CAD (coronary artery disease)   . Chest pain at rest, not due to CAD, possible due to a fib. 05/15/2015  . Chronic bronchitis ([place]) 06/17/2011  . Orthostatic hypotension 12/06/2010  . Paroxysmal atrial fibrillation (HCC)   . Renal insufficiency   . Myalgia   . Hypothyroidism   . Glaucoma   . GERD, SEVERE 08/19/2010  . Cough 03/31/2010  . Essential hypertension 04/17/2008  . OSTEOARTHRITIS 04/17/2008  . FIBROMYALGIA 04/17/2008    Past Surgical History:  Procedure Laterality Date  . ABDOMINAL HYSTERECTOMY  1980  . ABDOMINAL HYSTERECTOMY    . BASAL CELL CARCINOMA EXCISION  11/08  . CARDIAC CATHETERIZATION N/A 05/15/2015  Procedure: Left Heart Cath and Coronary Angiography;  Surgeon: Sherren Mocha, MD;  Location: [facility];  Service: Cardiovascular;  Laterality: N/A;  . COLONOSCOPY N/A 04/27/2017   Dr Silverio Decamp for scant rectal bleeding and iron def anemia: Melanosis coli, lipomatous IC valve, left sided tics, non-bleeding hemorrhoid, 10 mm transverse polyp.    . ESOPHAGOGASTRODUODENOSCOPY N/A 04/27/2017   Mauri Pole, MD; [facility] ENDOSCOPY. for iron def anemia.  Monilial/candidial esophagitis. Small HH.    . EYE SURGERY    . scar tissue removal  1982  . THYROID SURGERY  2010  . THYROIDECTOMY  11-05-08     OB History   No obstetric history on file.        Home Medications    Prior to Admission medications   Medication Sig Start Date End Date Taking? Authorizing Provider  Acetaminophen (TYLENOL ARTHRITIS PAIN PO) Take 325 mg by mouth daily as needed (FOR MILD PAIN).    Yes [provider]  amitriptyline (ELAVIL) 50 MG tablet TAKE 1 TABLET(50 MG) BY MOUTH AT BEDTIME Patient taking differently: Take 50 mg by mouth at bedtime.  09/17/18  Yes Midge Minium, MD  atorvastatin (LIPITOR) 20 MG tablet TAKE 1 TABLET(20 MG) BY MOUTH DAILY Patient taking differently: Take 20 mg by mouth daily at 6 PM.  09/17/18  Yes Midge Minium, MD  bisacodyl (DULCOLAX) 5 MG EC tablet Take 5 mg by mouth 3 (three) times daily as needed for moderate constipation.   Yes [provider]  dicyclomine (BENTYL) 10 MG capsule TAKE 1 CAPSULE(10 MG) BY MOUTH THREE TIMES DAILY BEFORE MEALS FOR ABDOMINAL PAIN Patient taking differently: Take 10 mg by mouth 3 (three) times daily before meals.  10/17/18  Yes Ladene Artist, MD  DIGOX 125 MCG tablet TAKE 1 TABLET BY MOUTH DAILY Patient taking differently: Take 0.125 mg by mouth daily.  12/18/17  Yes Martinique, Peter M, MD  hydroxypropyl methylcellulose / hypromellose (ISOPTO TEARS / GONIOVISC) 2.5 % ophthalmic solution Place 2 drops into both eyes daily as needed for dry eyes.   Yes [provider]  levothyroxine (SYNTHROID, LEVOTHROID) 75 MCG tablet Take 1 tablet (75 mcg total) by mouth daily. 11/19/15  Yes Midge Minium, MD  metoprolol succinate (TOPROL-XL) 25 MG 24 hr tablet TAKE 1 TABLET(25 MG) BY MOUTH DAILY Patient taking differently: Take 25 mg by mouth daily. TAKE 1 TABLET(25 MG) BY MOUTH DAILY 09/17/18  Yes Midge Minium, MD  simethicone (MYLICON) 80 MG chewable tablet Chew 160 mg by mouth every 6 (six) hours as needed for flatulence.   Yes [provider]  spironolactone (ALDACTONE) 25 MG tablet TAKE 1/2 TABLET(12.5 MG) BY MOUTH DAILY Patient taking differently: Take  12.5 mg by mouth daily.  09/17/18  Yes Midge Minium, MD  ALPRAZolam Duanne Moron) 0.5 MG tablet Take 1 tablet (0.5 mg total) by mouth 2 (two) times daily as needed for anxiety. Patient not taking: Reported on 10/25/2018 12/07/17   Midge Minium, MD  methylPREDNISolone (MEDROL DOSEPAK) 4 MG TBPK tablet Take following package directions Patient not taking: Reported on 10/25/2018 08/17/18   Brunetta Jeans, PA-C  potassium chloride SA (K-DUR,KLOR-CON) 20 MEQ tablet Take 1 tablet (20 mEq total) by mouth daily. Patient not taking: Reported on 10/25/2018 08/02/18   Midge Minium, MD    Family History Family History  Problem Relation Age of Onset  . Asthma Mother   . Heart disease Mother   . Emphysema Father   .  Heart disease Father   . Irregular heart beat Brother   . Asthma Grandchild     Social History Social History   Tobacco Use  . Smoking status: Never Smoker  . Smokeless tobacco: Never Used  Substance Use Topics  . Alcohol use: Yes    Alcohol/week: 1.0 standard drinks    Types: 1 Glasses of wine per week    Comment: occasional wine or beer  . Drug use: No     Allergies   Fenofibrate; Erythromycin; Guaifenesin er; and Sulfonamide derivatives   Review of Systems Review of Systems  Neurological: Positive for dizziness and weakness.  All other systems reviewed and are negative.    Physical Exam Updated Vital Signs BP 120/83   Pulse 74   Temp 97.6 F (36.4 C)   Resp 13   SpO2 100%   Physical Exam Vitals signs and nursing note reviewed.  HENT:     Head: Normocephalic.     Nose: Nose normal.     Mouth/Throat:     Comments: MM dry  Eyes:     Extraocular Movements: Extraocular movements intact.     Pupils: Pupils are equal, round, and reactive to light.     Comments: No nystagmus   Neck:     Musculoskeletal: Normal range of motion.  Cardiovascular:     Rate and Rhythm: Normal rate and regular rhythm.  Pulmonary:     Effort: Pulmonary effort is  normal.     Breath sounds: Normal breath sounds.  Abdominal:     General: Abdomen is flat.     Palpations: Abdomen is soft.  Musculoskeletal: Normal range of motion.  Skin:    General: Skin is warm.     Capillary Refill: Capillary refill takes less than 2 seconds.  Neurological:     General: No focal deficit present.     Mental Status: She is alert and oriented to person, place, and time.     Comments: CN 2- 12 intact, nl strength and sensation throughout   Psychiatric:        Mood and Affect: Mood normal.      ED Treatments / Results  Labs (all labs ordered are listed, but only abnormal results are displayed) Labs Reviewed  POCT I-STAT EG7 - Abnormal; Notable for the following components:      Result Value   pO2, Ven 29.0 (*)    Bicarbonate 29.0 (*)    Acid-Base Excess 3.0 (*)    Potassium 2.5 (*)    Calcium, Ion 1.56 (*)    Hemoglobin 15.3 (*)    All other components within normal limits  URINALYSIS, ROUTINE W REFLEX MICROSCOPIC  CBC WITH DIFFERENTIAL/PLATELET  COMPREHENSIVE METABOLIC PANEL  DIGOXIN LEVEL  MAGNESIUM  I-STAT TROPONIN, ED  CBG MONITORING, ED    EKG EKG Interpretation  Date/Time:  Thursday October 25 2018 12:02:19 EST Ventricular Rate:  82 PR Interval:    QRS Duration: 86 QT Interval:  347 QTC Calculation: 406 R Axis:   78 Text Interpretation:  Sinus rhythm Anteroseptal infarct, age indeterminate Repol abnrm, severe global ischemia (LM/MVD) diffsue ST changes noted on prior tracings Confirmed by Virgel Manifold [phone]) on 10/25/2018 12:21:31 PM   Radiology Dg Chest Port 1 View  Result Date: 10/25/2018 CLINICAL DATA:  Dizziness, weakness, shortness of breath, progressive symptoms for 3 days, history asthma, GERD, hypertension, coronary artery disease, atrial fibrillation EXAM: PORTABLE CHEST 1 VIEW COMPARISON:  Portable exam 1223 hours compared to 04/25/2017 FINDINGS: Normal heart size, mediastinal  contours, and pulmonary vascularity. Lungs clear. No  infiltrate, pleural effusion, or pneumothorax. Bones appear demineralized with mild scattered endplate spur formation thoracic spine. IMPRESSION: No acute abnormalities. Electronically Signed   By: Lavonia Dana M.D.   On: 10/25/2018 13:08    Procedures Procedures (including critical care time)  CRITICAL CARE Performed by: Wandra Arthurs   Total critical care time: 30 minutes  Critical care time was exclusive of separately billable procedures and treating other patients.  Critical care was necessary to treat or prevent imminent or life-threatening deterioration.  Critical care was time spent personally by me on the following activities: development of treatment plan with patient and/or surrogate as well as nursing, discussions with consultants, evaluation of patient's response to treatment, examination of patient, obtaining history from patient or surrogate, ordering and performing treatments and interventions, ordering and review of laboratory studies, ordering and review of radiographic studies, pulse oximetry and re-evaluation of patient's condition.   Medications Ordered in ED Medications  sodium chloride flush (NS) 0.9 % injection 3 mL (has no administration in time range)  potassium chloride 10 mEq in 100 mL IVPB (has no administration in time range)  sodium chloride 0.9 % bolus 1,000 mL (1,000 mLs Intravenous Bolus 10/25/18 1259)     Initial Impression / Assessment and Plan / ED Course  I have reviewed the triage vital signs and the nursing notes.  Pertinent labs & imaging results that were available during my care of the patient were reviewed by me and considered in my medical decision making (see chart for details).       CHERRY TURLINGTON is a 68 y.o. female here with dizziness, orthostasis, dehydration. Concern for hypokalemia as patient didn't take her potassium supplementation for a month. She has ST depressions on EKG but has no chest pain and the depressions appears old.  Will get labs, hydrate patient.   2:21 PM Potassium is 2.3, normal magnesium. Ordered multiple potassium runs. I think hypokalemia likely secondary to dehydration and uncompliance. Glucose slightly low as well, given D50. Hospitalist to admit for observation for hypoglycemia, hypokalemia     Final Clinical Impressions(s) / ED Diagnoses   Final diagnoses:  None    ED Discharge Orders    None       Drenda Freeze, MD 10/25/18 1424

## 2018-10-25 NOTE — ED Triage Notes (Signed)
Pt complains of dizziness and weakness that has gotten progressively worse x 3 days. Pt denies loss of consciousness. Pt states she stopped taking her potassium 1 month ago because it was hard to swallow. Pt states she had similar symptoms when her Hgb was low a couple of years ago.

## 2018-10-25 NOTE — ED Notes (Signed)
Date and time results received: 10/25/18 1:52 PM   Test: K Critical Value: 2.3  Name of Provider Notified: Darl Householder  Orders Received? Or Actions Taken?: Orders Received - See Orders for details

## 2018-10-25 NOTE — ED Notes (Signed)
Verbally notified EDP Yao of patient's cbg. Stated I could bring her juice and crackers

## 2018-10-25 NOTE — Progress Notes (Signed)
Subjective:    Patient ID: Kaitlyn Adams, female    DOB: 16-Feb-1951, 68 y.o.   MRN: [id number]  HPI  Kaitlyn Adams is a 68 year old female who presents today with dizziness that occurs in the morning and associated with standing.. She states that this is noticed when getting up in the morning  Husband is with patient and states that her gait has been unsteady and dizziness present. He states that she eats "very little" and describes daily amount as being able to hold in the palm of his hand.  Presyncope/syncope associated: No Evaluation of provoking factors that can cause symptoms below: Change in head position: No Ear pressure: No Coughing/sneezing: No Valsalva: No Occur with standing/getting out of bed: Yes Lying down/rolling over in bed/bending neck: No Hearing loss: No  Associated unsteady gait and dry mouth have been present  Denies N/V, HA, change in vision, numbness, weakness, chest pain/palpitations.  History of cardiac dysfunction: CAD, orthostatic hypotension, PAT, PAF. She is followed by cardiology and is due in April. Per cardiology notes, AFib was noted as one documented episode, history of major GI bleed and falls so anticoagulation was stopped.  History of hypothyroidism, hyperlipidemia, HTN, protein calorie malnutrition  She reports that she has not taken her potassium supplement in approximately one month due to "large pill"   Wt Readings from Last 3 Encounters:  10/25/18 96 lb 0.6 oz (43.6 kg)  08/17/18 99 lb (44.9 kg)  08/01/18 97 lb 4 oz (44.1 kg)     Review of Systems  Constitutional: Negative for chills, fatigue and fever.  Respiratory: Negative for cough, shortness of breath and wheezing.   Cardiovascular: Negative for chest pain and palpitations.  Gastrointestinal: Negative for abdominal pain, diarrhea, nausea and vomiting.  Genitourinary: Negative for dysuria.  Musculoskeletal: Positive for gait problem.  Skin: Negative for rash.    Neurological: Positive for dizziness, weakness and light-headedness.   Past Medical History:  Diagnosis Date  . Anemia   . Asthma    - normal spirometry 2009 FEV1  >90% predicted.  - methacoline challenge neg 11/09  . Bronchitis    recurrent  . Cataract   . Chronic cough    - sinus CT 03-23-10 > neg.  - allergy profile 03-23-10 > nl, IgE 14.  - flutter valve rx 03-23-10  . Fibromyalgia   . GERD (gastroesophageal reflux disease)    exacerbating VCD  . Glaucoma   . Hyperlipidemia   . Hypertension   . Hyperthyroidism    with hot nodule.  - total thyroidectomy 11-05-08 benign -> subsequent hypothyroidism.  Marland Kitchen Hypokalemia   . Hypothyroidism   . Hypothyroidism    a. following thyroidectomy.  . Mild CAD    a. LHC 04/2015 - mild nonobstructive CAD with 30% dLAD, 40% D1, 25% pLCx, 30% mRCA, 30% RPDA, normal EF >65% with normal LVEDP.  . Orthostatic hypotension   . Osteoarthritis   . Paroxysmal atrial fibrillation (HCC)   . Renal insufficiency   . Vasomotor rhinitis    exacerbates VCD  . Vocal cord dysfunction    proven on FOB 9/09     Social History   Socioeconomic History  . Marital status: Married    Spouse name: Not on file  . Number of children: 1  . Years of education: Not on file  . Highest education level: Not on file  Occupational History  . Occupation: owns a Scientist, research (physical sciences)  . Occupation: OWNER    Employer: [employer]  Social Needs  . Financial resource strain: Not on file  . Food insecurity:    Worry: Not on file    Inability: Not on file  . Transportation needs:    Medical: Not on file    Non-medical: Not on file  Tobacco Use  . Smoking status: Never Smoker  . Smokeless tobacco: Never Used  Substance and Sexual Activity  . Alcohol use: Yes    Alcohol/week: 1.0 standard drinks    Types: 1 Glasses of wine per week    Comment: occasional wine or beer  . Drug use: No  . Sexual activity: Yes    Birth control/protection: None  Lifestyle  .  Physical activity:    Days per week: Not on file    Minutes per session: Not on file  . Stress: Not on file  Relationships  . Social connections:    Talks on phone: Not on file    Gets together: Not on file    Attends religious service: Not on file    Active member of club or organization: Not on file    Attends meetings of clubs or organizations: Not on file    Relationship status: Not on file  . Intimate partner violence:    Fear of current or ex partner: Not on file    Emotionally abused: Not on file    Physically abused: Not on file    Forced sexual activity: Not on file  Other Topics Concern  . Not on file  Social History Narrative  . Not on file    Past Surgical History:  Procedure Laterality Date  . ABDOMINAL HYSTERECTOMY  1980  . ABDOMINAL HYSTERECTOMY    . BASAL CELL CARCINOMA EXCISION  11/08  . CARDIAC CATHETERIZATION N/A 05/15/2015   Procedure: Left Heart Cath and Coronary Angiography;  Surgeon: Sherren Mocha, MD;  Location: [facility];  Service: Cardiovascular;  Laterality: N/A;  . COLONOSCOPY N/A 04/27/2017   Dr Silverio Decamp for scant rectal bleeding and iron def anemia: Melanosis coli, lipomatous IC valve, left sided tics, non-bleeding hemorrhoid, 10 mm transverse polyp.    . ESOPHAGOGASTRODUODENOSCOPY N/A 04/27/2017   Mauri Pole, MD; [facility] ENDOSCOPY. for iron def anemia.  Monilial/candidial esophagitis. Small HH.    . EYE SURGERY    . scar tissue removal  1982  . THYROID SURGERY  2010  . THYROIDECTOMY  11-05-08    Family History  Problem Relation Age of Onset  . Asthma Mother   . Heart disease Mother   . Emphysema Father   . Heart disease Father   . Irregular heart beat Brother   . Asthma Grandchild     Allergies  Allergen Reactions  . Fenofibrate Other (See Comments)    GI side effects   . Erythromycin Nausea And Vomiting    REACTION: nausea and vomiting  . Guaifenesin Er Palpitations  . Sulfonamide Derivatives Nausea And Vomiting     REACTION: nausea and vomiting    Current Outpatient Medications on File Prior to Visit  Medication Sig Dispense Refill  . Acetaminophen (TYLENOL ARTHRITIS PAIN PO) Take 325 mg by mouth daily as needed (FOR MILD PAIN).     Marland Kitchen ALPRAZolam (XANAX) 0.5 MG tablet Take 1 tablet (0.5 mg total) by mouth 2 (two) times daily as needed for anxiety. 60 tablet 3  . amitriptyline (ELAVIL) 50 MG tablet TAKE 1 TABLET(50 MG) BY MOUTH AT BEDTIME 90 tablet 0  . atorvastatin (LIPITOR) 20 MG tablet TAKE 1  TABLET(20 MG) BY MOUTH DAILY 90 tablet 0  . dicyclomine (BENTYL) 10 MG capsule TAKE 1 CAPSULE(10 MG) BY MOUTH THREE TIMES DAILY BEFORE MEALS FOR ABDOMINAL PAIN 90 capsule 0  . DIGOX 125 MCG tablet TAKE 1 TABLET BY MOUTH DAILY 90 tablet 3  . levothyroxine (SYNTHROID, LEVOTHROID) 75 MCG tablet Take 1 tablet (75 mcg total) by mouth daily. 90 tablet 1  . metoprolol succinate (TOPROL-XL) 25 MG 24 hr tablet TAKE 1 TABLET(25 MG) BY MOUTH DAILY 90 tablet 0  . spironolactone (ALDACTONE) 25 MG tablet TAKE 1/2 TABLET(12.5 MG) BY MOUTH DAILY 45 tablet 0  . methylPREDNISolone (MEDROL DOSEPAK) 4 MG TBPK tablet Take following package directions (Patient not taking: Reported on 10/25/2018) 21 tablet 0  . potassium chloride SA (K-DUR,KLOR-CON) 20 MEQ tablet Take 1 tablet (20 mEq total) by mouth daily. (Patient not taking: Reported on 10/25/2018) 30 tablet 3   No current facility-administered medications on file prior to visit.     BP 108/74 (BP Location: Left Arm, Patient Position: Sitting, Cuff Size: Normal)   Pulse 87   Temp 98 F (36.7 C) (Oral)   Ht 5\' 4"  (1.626 m)   Wt 96 lb 0.6 oz (43.6 kg)   SpO2 98%   BMI 16.49 kg/m        Objective:   Physical Exam Constitutional:      Appearance: She is ill-appearing.     Comments: Thin, frail, does not appear optimally nourished  HENT:     Nose: Nose normal.     Mouth/Throat:     Mouth: Mucous membranes are dry.     Comments: Pale lips Eyes:     General: No scleral  icterus.    Pupils: Pupils are equal, round, and reactive to light.  Neck:     Musculoskeletal: Neck supple.  Cardiovascular:     Rate and Rhythm: Normal rate and regular rhythm.     Pulses: Normal pulses.  Pulmonary:     Effort: Pulmonary effort is normal.     Breath sounds: Normal breath sounds.  Abdominal:     General: Abdomen is flat. Bowel sounds are normal.     Palpations: Abdomen is soft.  Lymphadenopathy:     Cervical: No cervical adenopathy.  Skin:    General: Skin is warm and dry.  Neurological:     Motor: Weakness present.     Gait: Gait abnormal.     Comments: II-Visual fields grossly intact. III/IV/VI-Extraocular movements intact. Pupils reactive bilaterally. V/VII-Smile symmetric, equal eyebrow raise, facial sensation intact VIII- Hearing grossly intact XI-bilateral shoulder shrug XII-midline tongue extension Motor: 4/5 upper extremities Cerebellar: Attempted testing of finger to noseand Romberg. Patient unable to stand with eyes closed due to dizziness Attempted orthostatic BP evaluation however patient unsteady when standing due to dizziness       Assessment & Plan:  1. Orthostatic dizziness Unable to complete orthostatic BP evaluation due to dizziness and unsteady gait. This is most notable when patient moved from a sitting to standing position. Suspect that history of malnutrition/hydration are contributing to this symptom today.  2. Unsteady gait   3. Moderate protein-calorie malnutrition ([place]) She is losing weight and this has been chronic in nature. She states that she tries to drink diet pepsi during the day and denies addition of protein shakes that was advised by PCP.  She and her husband endorse that she does not eat regularly and that she will eat very small portions of food.   Unsteady gait, unable  to tolerate evaluation for orthostatic BP changes, non adherence to potassium supplement and digoxin use, weight loss, malnutrition history and  concern for dehydration present today. History of 2.8 potassium 08/01/18 which improved to 3.3 at recheck on 08/17/18, however patient stopped potassium supplement noting pill was too large to swallow. Discussed with patient and husband the importance of further workup in the ED today. They voiced understanding and agreed with plan.  Delano Metz, FNP-C

## 2018-10-25 NOTE — Telephone Encounter (Signed)
Pt c/o severe lightheadedness and dizziness with standing and walking. Pt stated that she has vertigo. Pt stated when she stands up, she has to hold on to objects to prevent falls. Pt has experienced dizziness intermittently since after Christmas. Pt has fallen 5-6 times this year. Pt has hit her head 3 times. Pt stated that she has h/o chest discomfort for the past 2 weeks. Pt stated that she has fibromyalgia and stated that she has the discomfort due to her history of fibromyalgia. Pt stated she has been having sinus congestion. Pt has never been evaluated after the falls. Pt denies any weakness or Pt given care advice and pt verbalized understanding. no openings at [place]. Pt given appt for this morning with Delano Metz NP.   Reason for Disposition . [1] Dizziness caused by heat exposure, sudden standing, or poor fluid intake AND [2] no improvement after 2 hours of rest and fluids  Answer Assessment - Initial Assessment Questions 1. DESCRIPTION: "Describe your dizziness."     Has no control over leg was so dizzy needed help getting  2. LIGHTHEADED: "Do you feel lightheaded?" (e.g., somewhat faint, woozy, weak upon standing)     Weak upon standing  3. VERTIGO: "Do you feel like either you or the room is spinning or tilting?" (i.e. vertigo)    vertigo 4. SEVERITY: "How bad is it?"  "Do you feel like you are going to faint?" "Can you stand and walk?"   - MILD - walking normally   - MODERATE - interferes with normal activities (e.g., work, school)    - SEVERE - unable to stand, requires support to walk, feels like passing out now.      severe 5. ONSET:  "When did the dizziness begin?"     Previous episode 09/28/18 and again Tuesday 6. AGGRAVATING FACTORS: "Does anything make it worse?" (e.g., standing, change in head position)     Standing, moving head 7. HEART RATE: "Can you tell me your heart rate?" "How many beats in 15 seconds?"  (Note: not all patients can do this)       88 8.  CAUSE: "What do you think is causing the dizziness?"     Sinus issue 9. RECURRENT SYMPTOM: "Have you had dizziness before?" If so, ask: "When was the last time?" "What happened that time?"    09/28/18 fell and hit head - h/o falls 5-6 falls past so far this year hit head 3 out of 4 falls 10. OTHER SYMPTOMS: "Do you have any other symptoms?" (e.g., fever, chest pain, vomiting, diarrhea, bleeding)       Chest discomfort has fibromyalgia 2 weeks 11. PREGNANCY: "Is there any chance you are pregnant?" "When was your last menstrual period?"       n/a  Protocols used: DIZZINESS [facility]

## 2018-10-26 DIAGNOSIS — I480 Paroxysmal atrial fibrillation: Secondary | ICD-10-CM | POA: Diagnosis not present

## 2018-10-26 DIAGNOSIS — I10 Essential (primary) hypertension: Secondary | ICD-10-CM | POA: Diagnosis not present

## 2018-10-26 DIAGNOSIS — E785 Hyperlipidemia, unspecified: Secondary | ICD-10-CM | POA: Diagnosis not present

## 2018-10-26 DIAGNOSIS — I951 Orthostatic hypotension: Secondary | ICD-10-CM | POA: Diagnosis not present

## 2018-10-26 DIAGNOSIS — E039 Hypothyroidism, unspecified: Secondary | ICD-10-CM | POA: Diagnosis not present

## 2018-10-26 DIAGNOSIS — I471 Supraventricular tachycardia: Secondary | ICD-10-CM | POA: Diagnosis not present

## 2018-10-26 LAB — BASIC METABOLIC PANEL
Anion gap: 5 (ref 5–15)
BUN: 7 mg/dL — ABNORMAL LOW (ref 8–23)
CO2: 21 mmol/L — ABNORMAL LOW (ref 22–32)
Calcium: 9.3 mg/dL (ref 8.9–10.3)
Chloride: 116 mmol/L — ABNORMAL HIGH (ref 98–111)
Creatinine, Ser: 1.09 mg/dL — ABNORMAL HIGH (ref 0.44–1.00)
GFR calc Af Amer: 60 mL/min — ABNORMAL LOW (ref >60)
GFR calc non Af Amer: 52 mL/min — ABNORMAL LOW (ref >60)
Glucose, Bld: 97 mg/dL (ref 70–99)
POTASSIUM: 3.9 mmol/L (ref 3.5–5.1)
Sodium: 142 mmol/L (ref 135–145)

## 2018-10-26 LAB — GLUCOSE, CAPILLARY
GLUCOSE-CAPILLARY: 83 mg/dL (ref 70–99)
Glucose-Capillary: 63 mg/dL — ABNORMAL LOW (ref 70–99)

## 2018-10-26 LAB — HIV ANTIBODY (ROUTINE TESTING W REFLEX): HIV Screen 4th Generation wRfx: NONREACTIVE

## 2018-10-26 LAB — ACTH STIMULATION, 3 TIME POINTS
Cortisol, 30 Min: 16.4 ug/dL
Cortisol, 60 Min: 21.4 ug/dL
Cortisol, Base: 6.9 ug/dL

## 2018-10-26 LAB — CORTISOL-AM, BLOOD: Cortisol - AM: 1.8 ug/dL — ABNORMAL LOW (ref 6.7–22.6)

## 2018-10-26 MED ORDER — METOPROLOL SUCCINATE ER 25 MG PO TB24: 12.5000 mg | ORAL_TABLET | Freq: Every day | ORAL | Status: DC

## 2018-10-26 MED ORDER — METOPROLOL SUCCINATE ER 25 MG PO TB24
12.5000 mg | ORAL_TABLET | Freq: Every day | ORAL | Status: DC
  Administered 2018-10-26: 12.5 mg via ORAL
  Filled 2018-10-26: qty 1

## 2018-10-26 MED ORDER — COSYNTROPIN 0.25 MG IJ SOLR
0.2500 mg | Freq: Once | INTRAMUSCULAR | Status: AC
  Administered 2018-10-26: 0.25 mg via INTRAVENOUS
  Filled 2018-10-26: qty 0.25

## 2018-10-26 MED ORDER — BLOOD GLUCOSE METER KIT: PACK | 0 refills | Status: DC

## 2018-10-26 NOTE — Discharge Instructions (Addendum)
Kaitlyn Adams,  You were in the hospital because of low blood pressure and low potassium. Your blood pressure medications have been adjusted. Please follow-up with your primary care physician. While in the hospital, you also had a test done that showed a low cortisol level but the follow-up test showed adequate responses by your body. This low cortisol may be because of your recent steroid use. This should be followed by your primary care physician or your endocrinologist. Please eat adequately to keep your blood sugar up. I have included signs/symptoms of low blood sugar. I have also sent a prescription to the pharmacy for you to get a blood sugar monitor.

## 2018-10-26 NOTE — Progress Notes (Addendum)
Initial Nutrition Assessment  DOCUMENTATION CODES:   Underweight, Non-severe (moderate) malnutrition in context of social or environmental circumstances  INTERVENTION:  Magic cup BID with meals, each supplement provides 290 kcal and 9 grams of protein  Ensure Enlive po BID, each supplement provides 350 kcal and 20 grams of protein--pt agreeable to trying strawberry.     NUTRITION DIAGNOSIS:   Moderate Malnutrition related to social / environmental circumstances(Job/life stress with declining appetite) as evidenced by per patient/family report, energy intake < 75% for > or equal to 3 months, moderate fat depletion, severe muscle depletion.   GOAL:   Patient will meet greater than or equal to 90% of their needs   MONITOR:   PO intake, Supplement acceptance, Weight trends, Labs  REASON FOR ASSESSMENT:   Malnutrition Screening Tool    ASSESSMENT:   Pt is a 1y F with PMH of Paroxysmal A.Fib, PAT, hypothyroidism, HLD, essential HTN, hypokalemia. Presented to ED with lightheadedness 3x days, with reports for not taking potassium supplements. Pt now admitted for Hypokalemia.   Pt states that her appetite has been low over the course of the last year, that this is her normal. 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 A typical good day is a [place] biscuit egg/bacon, or a honeybun. If not purchasing breakfast she eats an oatmeal cookie. For lunch she eats a sandwich (ham, [place], or chicken salad). Dinner is typical, but usually 2 nights a week her and her husband fix a big meal such as pot roast/potatoes, or country style steak to have family nights with son/wife and grandchildren.  Some nights are left overs. Most days, similar food items but only about 50% consumption.  Doesn't drink ensure. Pt states they are too medicinal but will try the strawberry flavor. Meal tray in room was <50% completed, with husband stating he nibbled  on what she didn't eat. Pt and husband also state that she doesn't dink a lot of water during the day, and only really drinks diet soda. Pt is agreeable to trying magic cup while in hospital.   Pt endorses weight loss: 2018 she weighed 128#, end of 2019 weighing 106# (which was her new UBW), and now weighing 97.9#. from Dec.2019 to now, this would indicate a 7.6% weight loss significant for timeframe of 3 months. However, per weight encounters her highest weight in early 2019 was 46.8kg (103#). Per weight encounters she has only lost 0.4kg since end of last year.  Pt is very mobile at baseline, stating that she walks a lot for her job which is reflected in NFPE findings.   Labs reviewed:  Glucose 63(L)  Medications reviewed.    NUTRITION - FOCUSED PHYSICAL EXAM:    Most Recent Value  Orbital Region  Mild depletion  Upper Arm Region  Moderate depletion  Thoracic and Lumbar Region  Moderate depletion  Buccal Region  Mild depletion  Temple Region  Moderate depletion  Clavicle Bone Region  Severe depletion  Clavicle and Acromion Bone Region  Severe depletion  Scapular Bone Region  Severe depletion  Dorsal Hand  Moderate depletion  Patellar Region  No depletion  Anterior Thigh Region  No depletion  Posterior Calf Region  No depletion  Edema (RD Assessment)  None  Hair  Reviewed [Thinning/breaks easily, pt states from stress. ]  Eyes  Reviewed  Mouth  Reviewed  Skin  Reviewed  Nails  Reviewed       Diet Order:  Diet Order            Diet - low sodium heart healthy        Diet Heart Room service appropriate? Yes; Fluid consistency: Thin  Diet effective now              EDUCATION NEEDS:   Education needs have been addressed  Skin:  Skin Assessment: Reviewed RN Assessment  Last BM:  3/5  Height:   Ht Readings from Last 1 Encounters:  10/25/18 5\' 4"  (1.626 m)    Weight:   Wt Readings from Last 1 Encounters:  10/25/18 44.5 kg    Ideal Body Weight:  54.5  kg  BMI:  Body mass index is 16.82 kg/m.  Estimated Nutritional Needs:   Kcal:  1300-1600  Protein:  65-80grams  Fluid:  >1.6L    Herma Carson, [place] [employer]etic Intern

## 2018-10-26 NOTE — Discharge Summary (Signed)
Physician Discharge Summary  Kaitlyn Adams MAU:[id number] DOB: November 07, 1950 DOA: 10/25/2018  PCP: Midge Minium, MD  Admit date: 10/25/2018 Discharge date: 10/26/2018  Admitted From: Home Disposition: Home  Recommendations for Outpatient Follow-up:  1. Follow up with PCP in 1 week 2. Follow/call cardiology to inform of change to beta-blocker therapy for arrhythmia treatment 3. Please obtain BMP/CBC in one week 4. Please follow up on the following pending results: None  Home Health: None Equipment/Devices: None  Discharge Condition: Stable CODE STATUS: Full code Diet recommendation: Heart healthy   Brief/Interim Summary:  Admission HPI written by Mariel Aloe, MD   Chief Complaint: Dizziness  HPI: Kaitlyn Adams is a 68 y.o. female with medical history significant of paroxysmal atrial fibrillation, PAT, hypothyroidism, hyperlipidemia, essential hypertension, hypokalemia. Patient started having lightheadedness starting about three days ago. Symptoms initially improved with time and she tried to go to work, which exacerbated her symptoms, requiring her to hold onto furniture to get around. Symptoms persisted today and patient went to her PCPs office for evaluation. From there she was sent to the ED for evaluation. She has been drinking diet pepsi but not much water. She says her husband does not think she eats much. She has not been taking her potassium supplements. She has been taking her metoprolol and spironolactone (she is unsure of why she takes the spironolactone).   ED Course: Vitals: Afebrile, normal pulse, normal respirations, on room air, orthostatic Labs: Potassium of 2.3, creatinine of 1.22, calcium of 12.3, hemoglobin of 15.6 Imaging: Chest x-ray unremarkable Medications/Course: NS 1L bolus    Hospital course:  Orthostatic hypotension Likely secondary to dehydration and medication effect. Patient is on metoprolol and spironolactone with no recent  medication changes. Responded well to IV fluids but responded better to decreasing metoprolol dose. Cortisol level checked and was low at 1.8. ACTH stim test performed and was significant for an appropriate response. Patient worked with physical therapy without symptoms. Prior to discharge, while working with physical therapy, orthostatic vitals were negative.  Hypokalemia Chronic issue and patient has not been taking her potassium supplementation. No associated sodium abnormality. Patient given potassium 40 meq x3 with response from 2.3-3.9. Continue outpatient potassium supplementation.  Hypoglycemia Unknown etiology. Patient is not on hypoglycemic medications. As mentioned above, Cortisol level was low. ACTH stim test not suggestive of adrenal insufficiency. Glucose monitor prescribed on discharge. Follow-up with PCP vs endocrinologist.  Paroxysmal atrial fibrillation Paroxysmal atrial tachycardia Rate controlled. Not on anticoagulation secondary to history of GI bleed. Continued Digoxin, and decreased metoprolol  Hypothyroidism Continue Synthroid  Hypercalcemia Appears to be a chronic issue being worked up by PCP. Low PTH on previous workup. May be contributing to fatigue, however, resolved with IV fluids.  Hyperlipidemia Continue Lipitor  Nonobstructive CAD Normal EF. Follows with Dr. Martinique  Essential hypertension Patient is on metoprolol and spironolactone. Metoprolol dose decreased by half (12.5 mg dose) and spironolactone discontinued.  Discharge Diagnoses:  Principal Problem:   Orthostatic hypotension Active Problems:   Essential hypertension   Paroxysmal atrial fibrillation (HCC)   Hypothyroidism   Hyperlipidemia   PAT (paroxysmal atrial tachycardia) (HCC)   Hypokalemia    Discharge Instructions   Allergies as of 10/26/2018      Reactions   Fenofibrate Other (See Comments)   GI side effects   Erythromycin Nausea And Vomiting   REACTION: nausea and  vomiting   Guaifenesin Er Palpitations   Sulfonamide Derivatives Nausea And Vomiting   REACTION: nausea and  vomiting      Medication List    STOP taking these medications   methylPREDNISolone 4 MG Tbpk tablet Commonly known as:  MEDROL DOSEPAK   spironolactone 25 MG tablet Commonly known as:  ALDACTONE     TAKE these medications   ALPRAZolam 0.5 MG tablet Commonly known as:  XANAX Take 1 tablet (0.5 mg total) by mouth 2 (two) times daily as needed for anxiety.   amitriptyline 50 MG tablet Commonly known as:  ELAVIL TAKE 1 TABLET(50 MG) BY MOUTH AT BEDTIME What changed:  See the new instructions.   atorvastatin 20 MG tablet Commonly known as:  LIPITOR TAKE 1 TABLET(20 MG) BY MOUTH DAILY What changed:  See the new instructions.   bisacodyl 5 MG EC tablet Commonly known as:  DULCOLAX Take 5 mg by mouth 3 (three) times daily as needed for moderate constipation.   blood glucose meter kit and supplies Dispense based on patient and insurance preference. Check blood sugar daily prior to eating breakfast or if signs/symptoms of hypoglycemia   dicyclomine 10 MG capsule Commonly known as:  BENTYL TAKE 1 CAPSULE(10 MG) BY MOUTH THREE TIMES DAILY BEFORE MEALS FOR ABDOMINAL PAIN What changed:  See the new instructions.   Digox 0.125 MG tablet Generic drug:  digoxin TAKE 1 TABLET BY MOUTH DAILY What changed:  how much to take   hydroxypropyl methylcellulose / hypromellose 2.5 % ophthalmic solution Commonly known as:  ISOPTO TEARS / GONIOVISC Place 2 drops into both eyes daily as needed for dry eyes.   levothyroxine 75 MCG tablet Commonly known as:  SYNTHROID, LEVOTHROID Take 1 tablet (75 mcg total) by mouth daily.   metoprolol succinate 25 MG 24 hr tablet Commonly known as:  TOPROL-XL Take 0.5 tablets (12.5 mg total) by mouth daily. Start taking on:  October 27, 2018 What changed:  See the new instructions.   potassium chloride SA 20 MEQ tablet Commonly known as:   K-DUR,KLOR-CON Take 1 tablet (20 mEq total) by mouth daily.   simethicone 80 MG chewable tablet Commonly known as:  MYLICON Chew 151 mg by mouth every 6 (six) hours as needed for flatulence.   TYLENOL ARTHRITIS PAIN PO Take 325 mg by mouth daily as needed (FOR MILD PAIN).       Allergies  Allergen Reactions  . Fenofibrate Other (See Comments)    GI side effects   . Erythromycin Nausea And Vomiting    REACTION: nausea and vomiting  . Guaifenesin Er Palpitations  . Sulfonamide Derivatives Nausea And Vomiting    REACTION: nausea and vomiting    Consultations:  None   Procedures/Studies: Ct Head Wo Contrast  Result Date: 10/25/2018 CLINICAL DATA:  Dizziness and weakness, progressively worsening for 3 days. EXAM: CT HEAD WITHOUT CONTRAST TECHNIQUE: Contiguous axial images were obtained from the base of the skull through the vertex without intravenous contrast. COMPARISON:  Head CT dated 04/02/2009 FINDINGS: Brain: Minimal chronic small vessel ischemic change within the deep periventricular white matter regions bilaterally. No mass, hemorrhage, edema or other evidence of acute parenchymal abnormality. No extra-axial hemorrhage. Vascular: Chronic calcified atherosclerotic changes of the large vessels at the skull base. No unexpected hyperdense vessel. Skull: Normal. Negative for fracture or focal lesion. Sinuses/Orbits: No acute finding. Other: None. IMPRESSION: No acute findings. No intracranial mass, hemorrhage or edema. Electronically Signed   By: Franki Cabot M.D.   On: 10/25/2018 13:28   Dg Chest Port 1 View  Result Date: 10/25/2018 CLINICAL DATA:  Dizziness, weakness, shortness  of breath, progressive symptoms for 3 days, history asthma, GERD, hypertension, coronary artery disease, atrial fibrillation EXAM: PORTABLE CHEST 1 VIEW COMPARISON:  Portable exam 1223 hours compared to 04/25/2017 FINDINGS: Normal heart size, mediastinal contours, and pulmonary vascularity. Lungs clear. No  infiltrate, pleural effusion, or pneumothorax. Bones appear demineralized with mild scattered endplate spur formation thoracic spine. IMPRESSION: No acute abnormalities. Electronically Signed   By: Lavonia Dana M.D.   On: 10/25/2018 13:08      Subjective: Feeling better today.   Discharge Exam: Vitals:   10/26/18 0826 10/26/18 0828  BP: (!) 81/58 (!) 87/53  Pulse: 81 82  Resp:    Temp:    SpO2: 100% 100%   Vitals:   10/26/18 0439 10/26/18 0824 10/26/18 0826 10/26/18 0828  BP: 94/60 (!) 82/52 (!) 81/58 (!) 87/53  Pulse: 64 83 81 82  Resp: 16 16    Temp: 98.1 F (36.7 C) 98 F (36.7 C)    TempSrc: Oral Oral    SpO2: 100% 100% 100% 100%  Weight:      Height:        General: Pt is alert, awake, not in acute distress Cardiovascular: RRR, S1/S2 +, no rubs, no gallops Respiratory: CTA bilaterally, no wheezing, no rhonchi Abdominal: Soft, NT, ND, bowel sounds + Extremities: no edema, no cyanosis    The results of significant diagnostics from this hospitalization (including imaging, microbiology, ancillary and laboratory) are listed below for reference.     Microbiology: No results found for this or any previous visit (from the past 240 hour(s)).   Labs: BNP (last 3 results) No results for input(s): BNP in the last 8760 hours. Basic Metabolic Panel: Recent Labs  Lab 10/25/18 1253 10/25/18 1300 10/26/18 0300  NA 139 140 142  K 2.3* 2.5* 3.9  CL 103  --  116*  CO2 25  --  21*  GLUCOSE 95  --  97  BUN 9  --  7*  CREATININE 1.22*  --  1.09*  CALCIUM 12.3*  --  9.3  MG 1.7  --   --    Liver Function Tests: Recent Labs  Lab 10/25/18 1253  AST 21  ALT 14  ALKPHOS 81  BILITOT 0.9  PROT 7.4  ALBUMIN 4.5   No results for input(s): LIPASE, AMYLASE in the last 168 hours. No results for input(s): AMMONIA in the last 168 hours. CBC: Recent Labs  Lab 10/25/18 1253 10/25/18 1300  WBC 8.5  --   NEUTROABS 6.1  --   HGB 15.6* 15.3*  HCT 47.5* 45.0  MCV 85.7   --   PLT 281  --    Cardiac Enzymes: No results for input(s): CKTOTAL, CKMB, CKMBINDEX, TROPONINI in the last 168 hours. BNP: Invalid input(s): POCBNP CBG: Recent Labs  Lab 10/25/18 1406 10/25/18 1411 10/25/18 1647 10/25/18 2117 10/26/18 0713  GLUCAP 61* 74 73 95 63*   Urinalysis    Component Value Date/Time   COLORURINE YELLOW 10/25/2018 1253   APPEARANCEUR CLEAR 10/25/2018 1253   LABSPEC 1.009 10/25/2018 1253   PHURINE 6.0 10/25/2018 1253   GLUCOSEU NEGATIVE 10/25/2018 1253   HGBUR NEGATIVE 10/25/2018 1253   BILIRUBINUR NEGATIVE 10/25/2018 1253   KETONESUR 5 (A) 10/25/2018 1253   PROTEINUR NEGATIVE 10/25/2018 1253   UROBILINOGEN 1.0 04/01/2009 1425   NITRITE NEGATIVE 10/25/2018 1253   LEUKOCYTESUR LARGE (A) 10/25/2018 1253    SIGNED:   Cordelia Poche, MD [facility] 10/26/2018, 12:25 PM

## 2018-10-26 NOTE — Progress Notes (Signed)
PT Cancellation Note  Patient Details Name: Kaitlyn Adams MRN: [id number] DOB: Jun 14, 1951   Cancelled Treatment:    Reason Eval/Treat Not Completed: Medical issues which prohibited therapy(low BP)   Yuna Pizzolato,KATHrine E 10/26/2018, 9:38 AM Carmelia Bake, PT, DPT Acute Rehabilitation Services Office: [phone] Pager: [phone]

## 2018-10-26 NOTE — Evaluation (Signed)
Physical Therapy One Time Evaluation Patient Details Name: Kaitlyn Adams MRN: [id number] DOB: [birth year] Today's Date: 10/26/2018   History of Present Illness  68 y.o. female with medical history significant of paroxysmal atrial fibrillation, PAT, hypothyroidism, hyperlipidemia, essential hypertension, hypokalemia and admitted for hypokalemia and orthostatic hypotension  Clinical Impression  Patient evaluated by Physical Therapy with no further acute PT needs identified. All education has been completed and the patient has no further questions.  Per RN, pt likely to d/c home today.  Pt seen for mobility and denies dizziness with activity.  BP and HR at rest: 92/57 mmHg and 75 bpm.  BP and HR after ambulating: 100/66 mmHg and 72 bpm.  Pt appears to be mobilizing at baseline and no further Physical Therapy or equipment needs identified. PT is signing off. Thank you for this referral.     Follow Up Recommendations No PT follow up    Equipment Recommendations  None recommended by PT    Recommendations for Other Services       Precautions / Restrictions Precautions Precautions: Fall      Mobility  Bed Mobility Overal bed mobility: Modified Independent                Transfers Overall transfer level: Needs assistance Equipment used: None Transfers: Sit to/from Stand Sit to Stand: Supervision         General transfer comment: supervision only for safety (low BP)  Ambulation/Gait Ambulation/Gait assistance: Supervision Gait Distance (Feet): 300 Feet Assistive device: None Gait Pattern/deviations: WFL(Within Functional Limits)     General Gait Details: no dizziness or symptoms   Stairs            Wheelchair Mobility    Modified Rankin (Stroke Patients Only)       Balance                                             Pertinent Vitals/Pain Pain Assessment: No/denies pain    Home Living Family/patient expects to be discharged to::  Private residence Living Arrangements: Spouse/significant other   Type of Home: House       Home Layout: Multi-level;Able to live on main level with bedroom/bathroom Home Equipment: None      Prior Function Level of Independence: Independent               Hand Dominance        Extremity/Trunk Assessment   Upper Extremity Assessment Upper Extremity Assessment: Overall WFL for tasks assessed    Lower Extremity Assessment Lower Extremity Assessment: Overall WFL for tasks assessed    Cervical / Trunk Assessment Cervical / Trunk Assessment: Normal  Communication   Communication: No difficulties  Cognition Arousal/Alertness: Awake/alert Behavior During Therapy: WFL for tasks assessed/performed Overall Cognitive Status: Within Functional Limits for tasks assessed                                        General Comments      Exercises     Assessment/Plan    PT Assessment Patent does not need any further PT services  PT Problem List         PT Treatment Interventions      PT Goals (Current goals can be found in the Care Plan section)  Acute Rehab PT Goals PT Goal Formulation: All assessment and education complete, DC therapy    Frequency     Barriers to discharge        Co-evaluation               AM-PAC PT "6 Clicks" Mobility  Outcome Measure Help needed turning from your back to your side while in a flat bed without using bedrails?: None Help needed moving from lying on your back to sitting on the side of a flat bed without using bedrails?: None Help needed moving to and from a bed to a chair (including a wheelchair)?: None Help needed standing up from a chair using your arms (e.g., wheelchair or bedside chair)?: None Help needed to walk in hospital room?: None Help needed climbing 3-5 steps with a railing? : None 6 Click Score: 24    End of Session Equipment Utilized During Treatment: Gait belt Activity Tolerance:  Patient tolerated treatment well Patient left: in bed;with call bell/phone within reach;with family/visitor present Nurse Communication: Mobility status PT Visit Diagnosis: Difficulty in walking, not elsewhere classified (R26.2)    Time: 1245-8099 PT Time Calculation (min) (ACUTE ONLY): 11 min   Charges:   PT Evaluation $PT Eval Low Complexity: [place], PT, DPT Acute Rehabilitation Services Office: [phone] Pager: [phone]  Trena Platt 10/26/2018, 10:37 AM

## 2018-10-29 ENCOUNTER — Telehealth: Payer: Self-pay

## 2018-10-29 NOTE — Telephone Encounter (Signed)
Pt scheduled for Hosp F/U on 11/05/18, was requesting to be seen sooner. Spoke with PCP and she stated that it was fine to keep her original appt date. Pt notified.

## 2018-10-29 NOTE — Telephone Encounter (Signed)
Transition Care Management Follow-up Telephone Call  Admit date: 10/25/2018 Discharge date: 10/26/2018 Principal Problem: Orthostatic hypotension   How have you been since you were released from the hospital? "I'm still dizzy and weak". Pt reports BP and BS remains low. Advised to return to ER if symptomatic.    Do you understand why you were in the hospital? yes   Do you understand the discharge instructions? yes   Where were you discharged to? Home. Husband with patient.    Items Reviewed:  Medications reviewed: yes  Allergies reviewed: yes  Dietary changes reviewed: yes  Referrals reviewed: yes   Functional Questionnaire:   Activities of Daily Living (ADLs):   She states they are independent in the following: ambulation, bathing and hygiene, feeding, continence, grooming, toileting and dressing States they require assistance with the following: None.    Any transportation issues/concerns?: no   Any patient concerns? no   Confirmed importance and date/time of follow-up visits scheduled yes  Provider Appointment booked with Elyn Aquas, PA-C on 11/01/2018.   Confirmed with patient if condition begins to worsen call PCP or go to the ER.  Patient was given the office number and encouraged to call back with question or concerns.  : yes

## 2018-11-01 ENCOUNTER — Other Ambulatory Visit: Payer: Self-pay | Admitting: Physician Assistant

## 2018-11-01 ENCOUNTER — Encounter: Payer: Self-pay | Admitting: Physician Assistant

## 2018-11-01 ENCOUNTER — Ambulatory Visit (INDEPENDENT_AMBULATORY_CARE_PROVIDER_SITE_OTHER): Payer: Medicare Other | Admitting: Physician Assistant

## 2018-11-01 ENCOUNTER — Other Ambulatory Visit: Payer: Self-pay

## 2018-11-01 VITALS — BP 138/70 | HR 70 | Temp 97.6°F | Resp 14 | Ht 64.0 in | Wt 99.0 lb

## 2018-11-01 DIAGNOSIS — Z681 Body mass index (BMI) 19 or less, adult: Secondary | ICD-10-CM

## 2018-11-01 DIAGNOSIS — E162 Hypoglycemia, unspecified: Secondary | ICD-10-CM | POA: Diagnosis not present

## 2018-11-01 DIAGNOSIS — I951 Orthostatic hypotension: Secondary | ICD-10-CM

## 2018-11-01 DIAGNOSIS — E876 Hypokalemia: Secondary | ICD-10-CM

## 2018-11-01 DIAGNOSIS — R630 Anorexia: Secondary | ICD-10-CM | POA: Diagnosis not present

## 2018-11-01 DIAGNOSIS — I480 Paroxysmal atrial fibrillation: Secondary | ICD-10-CM | POA: Diagnosis not present

## 2018-11-01 DIAGNOSIS — E039 Hypothyroidism, unspecified: Secondary | ICD-10-CM

## 2018-11-01 LAB — COMPREHENSIVE METABOLIC PANEL
ALT: 13 U/L (ref 0–35)
AST: 16 U/L (ref 0–37)
Albumin: 4.3 g/dL (ref 3.5–5.2)
Alkaline Phosphatase: 79 U/L (ref 39–117)
BILIRUBIN TOTAL: 0.5 mg/dL (ref 0.2–1.2)
BUN: 9 mg/dL (ref 6–23)
CO2: 28 mEq/L (ref 19–32)
CREATININE: 1.04 mg/dL (ref 0.40–1.20)
Calcium: 11.2 mg/dL — ABNORMAL HIGH (ref 8.4–10.5)
Chloride: 103 mEq/L (ref 96–112)
GFR: 52.52 mL/min — ABNORMAL LOW (ref >60.00)
Glucose, Bld: 94 mg/dL (ref 70–99)
Potassium: 3.9 mEq/L (ref 3.5–5.1)
Sodium: 138 mEq/L (ref 135–145)
Total Protein: 6.6 g/dL (ref 6.0–8.3)

## 2018-11-01 LAB — CBC WITH DIFFERENTIAL/PLATELET
Basophils Absolute: 0.1 10*3/uL (ref 0.0–0.1)
Basophils Relative: 1 % (ref 0.0–3.0)
Eosinophils Absolute: 0.1 10*3/uL (ref 0.0–0.7)
Eosinophils Relative: 1.2 % (ref 0.0–5.0)
HEMATOCRIT: 43 % (ref 36.0–46.0)
Hemoglobin: 14.5 g/dL (ref 12.0–15.0)
LYMPHS PCT: 22.3 % (ref 12.0–46.0)
Lymphs Abs: 1.6 10*3/uL (ref 0.7–4.0)
MCHC: 33.6 g/dL (ref 30.0–36.0)
MCV: 84 fl (ref 78.0–100.0)
Monocytes Absolute: 0.5 10*3/uL (ref 0.1–1.0)
Monocytes Relative: 6.9 % (ref 3.0–12.0)
Neutro Abs: 5 10*3/uL (ref 1.4–7.7)
Neutrophils Relative %: 68.6 % (ref 43.0–77.0)
Platelets: 292 10*3/uL (ref 150.0–400.0)
RBC: 5.12 Mil/uL — ABNORMAL HIGH (ref 3.87–5.11)
RDW: 13.3 % (ref 11.5–15.5)
WBC: 7.3 10*3/uL (ref 4.0–10.5)

## 2018-11-01 LAB — TSH: TSH: 0.33 u[IU]/mL — ABNORMAL LOW (ref 0.35–4.50)

## 2018-11-01 MED ORDER — FLUDROCORTISONE ACETATE 0.1 MG PO TABS: 0.1000 mg | ORAL_TABLET | Freq: Every day | ORAL | 1 refills | Status: DC

## 2018-11-01 NOTE — Patient Instructions (Addendum)
Please go to the lab today for blood work.  I will call you with your results. We will alter treatment regimen(s) if indicated by your results.   I am working with Levada Dy to expedite your Endocrinology appointment as we want this ASAP.  Work on Occupational hygienist dietary intake. I recommend 2 boost drinks per day in addition to what you are doing.  Keep hydrated.  Start the Florinef once daily. This should help keep BP stable when you change positions.   We will add on additional treatments once results are in.  ER for any acutely worsening symptoms.

## 2018-11-01 NOTE — Progress Notes (Signed)
Patient presents to clinic today for hospital follow-up/TCM visit. Patient presented to ER on 10/25/2018 with c/o significant lightheadedness and falls for 3 days. ER workup included vitals and labs revealing orthostatic hypotension, hypokalemia, hypercalcemia. CXR negative. EKG stable. Was given normal saline bolus and admitted for further assessment/management. During hospitalization, cortisol level found to be low. ACTH stimulation test performed and normal. No further assessment of this in hospital. Patient was given potassium supplementation in hospital with normalization of potassium levels. Was to continue supplementation on discharge. No cause of her hypoglycemia noted during hospitalization. Was recommended to follow-up with her Endocrinology. No insulin level in hospital per EMR review.  BP regimen was reduced with spironolactone being discontinued and Metoprolol reduced to 12.5 mg.  Since discharge, patient notes continued fatigue. Is having lightheadedness still with standing. Denies dizziness or syncopal event. Denies fever, chills. Notes good urinary and bowel output. Is hydrating well and feels she is eating well. Husband disagrees that she has been eating well, stating she hardly eats anything and has done this for the past several months. On review of typical 24-hour food intake, patient sometimes eats breakfast (few bites of bowl of cereal or an egg) but usually skips, eats about a 1/4 sandwich at lunch and nothing until dinner. Per husband this is the only meal she really eats -- meat and veggies. Does have a small bowl of ice cream after dinner on occasion. Denies nausea or vomiting with meals. Denies abdominal pain. States she is just not hungry. Prior to this past year, appetite has been good. Has had significant weight loss in past few years. Has colonoscopy scheduled in the next week at [facility].   Past Medical History:  Diagnosis Date  . Anemia   . Asthma    - normal spirometry 2009  FEV1  >90% predicted.  - methacoline challenge neg 11/09  . Bronchitis    recurrent  . Cataract   . Chronic cough    - sinus CT 03-23-10 > neg.  - allergy profile 03-23-10 > nl, IgE 14.  - flutter valve rx 03-23-10  . Fibromyalgia   . GERD (gastroesophageal reflux disease)    exacerbating VCD  . Glaucoma   . Hyperlipidemia   . Hypertension   . Hyperthyroidism    with hot nodule.  - total thyroidectomy 11-05-08 benign -> subsequent hypothyroidism.  Marland Kitchen Hypokalemia   . Hypothyroidism   . Hypothyroidism    a. following thyroidectomy.  . Mild CAD    a. LHC 04/2015 - mild nonobstructive CAD with 30% dLAD, 40% D1, 25% pLCx, 30% mRCA, 30% RPDA, normal EF >65% with normal LVEDP.  . Orthostatic hypotension   . Osteoarthritis   . Paroxysmal atrial fibrillation (HCC)   . Renal insufficiency   . Vasomotor rhinitis    exacerbates VCD  . Vocal cord dysfunction    proven on FOB 9/09    Current Outpatient Medications on File Prior to Visit  Medication Sig Dispense Refill  . Acetaminophen (TYLENOL ARTHRITIS PAIN PO) Take 325 mg by mouth daily as needed (FOR MILD PAIN).     Marland Kitchen ALPRAZolam (XANAX) 0.5 MG tablet Take 1 tablet (0.5 mg total) by mouth 2 (two) times daily as needed for anxiety. 60 tablet 3  . amitriptyline (ELAVIL) 50 MG tablet TAKE 1 TABLET(50 MG) BY MOUTH AT BEDTIME (Patient taking differently: Take 50 mg by mouth at bedtime. ) 90 tablet 0  . atorvastatin (LIPITOR) 20 MG tablet TAKE 1 TABLET(20 MG) BY MOUTH DAILY (  Patient taking differently: Take 20 mg by mouth daily at 6 PM. ) 90 tablet 0  . bisacodyl (DULCOLAX) 5 MG EC tablet Take 5 mg by mouth 3 (three) times daily as needed for moderate constipation.    . blood glucose meter kit and supplies Dispense based on patient and insurance preference. Check blood sugar daily prior to eating breakfast or if signs/symptoms of hypoglycemia 1 each 0  . dicyclomine (BENTYL) 10 MG capsule TAKE 1 CAPSULE(10 MG) BY MOUTH THREE TIMES DAILY BEFORE MEALS FOR  ABDOMINAL PAIN (Patient taking differently: Take 10 mg by mouth 3 (three) times daily before meals. ) 90 capsule 0  . DIGOX 125 MCG tablet TAKE 1 TABLET BY MOUTH DAILY (Patient taking differently: Take 0.125 mg by mouth daily. ) 90 tablet 3  . hydroxypropyl methylcellulose / hypromellose (ISOPTO TEARS / GONIOVISC) 2.5 % ophthalmic solution Place 2 drops into both eyes daily as needed for dry eyes.    . Lancets (ONETOUCH DELICA PLUS [id number]) MISC USE TO CHECK BLOOD SUGAR DAILY PRIOR TO EATING BREAKFAST OR IF SIGNS/SYMPTOMS OF HYPOGLYCEMIA OCCUR    . levothyroxine (SYNTHROID, LEVOTHROID) 75 MCG tablet Take 1 tablet (75 mcg total) by mouth daily. 90 tablet 1  . metoprolol succinate (TOPROL-XL) 25 MG 24 hr tablet Take 0.5 tablets (12.5 mg total) by mouth daily.    Glory Rosebush VERIO test strip USE TO CHECK BLOOD SUGAR DAILY PRIOR TO EATING BREAKFAST OR IF SIGNS/SYMPTOMS OF HYPOGLYCEMIA OCCUR    . potassium chloride SA (K-DUR,KLOR-CON) 20 MEQ tablet Take 1 tablet (20 mEq total) by mouth daily. 30 tablet 3  . simethicone (MYLICON) 80 MG chewable tablet Chew 160 mg by mouth every 6 (six) hours as needed for flatulence.     No current facility-administered medications on file prior to visit.     Allergies  Allergen Reactions  . Fenofibrate Other (See Comments)    GI side effects   . Erythromycin Nausea And Vomiting    REACTION: nausea and vomiting  . Guaifenesin Er Palpitations  . Sulfonamide Derivatives Nausea And Vomiting    REACTION: nausea and vomiting    Family History  Problem Relation Age of Onset  . Asthma Mother   . Heart disease Mother   . Emphysema Father   . Heart disease Father   . Irregular heart beat Brother   . Asthma Grandchild     Social History   Socioeconomic History  . Marital status: Married    Spouse name: Not on file  . Number of children: 1  . Years of education: Not on file  . Highest education level: Not on file  Occupational History  . Occupation: owns  a Scientist, research (physical sciences)  . Occupation: OWNER    Employer: [employer]  . Financial resource strain: Not on file  . Food insecurity:    Worry: Not on file    Inability: Not on file  . Transportation needs:    Medical: Not on file    Non-medical: Not on file  Tobacco Use  . Smoking status: Never Smoker  . Smokeless tobacco: Never Used  Substance and Sexual Activity  . Alcohol use: Yes    Alcohol/week: 1.0 standard drinks    Types: 1 Glasses of wine per week    Comment: occasional wine or beer  . Drug use: No  . Sexual activity: Yes    Birth control/protection: None  Lifestyle  . Physical activity:    Days per week:  Not on file    Minutes per session: Not on file  . Stress: Not on file  Relationships  . Social connections:    Talks on phone: Not on file    Gets together: Not on file    Attends religious service: Not on file    Active member of club or organization: Not on file    Attends meetings of clubs or organizations: Not on file    Relationship status: Not on file  Other Topics Concern  . Not on file  Social History Narrative  . Not on file   Review of Systems - See HPI.  All other ROS are negative.  BP 138/70   Pulse 70   Temp 97.6 F (36.4 C) (Oral)   Resp 14   Ht '5\' 4"'  (1.626 m)   Wt 99 lb (44.9 kg)   SpO2 98%   BMI 16.99 kg/m   Physical Exam Vitals signs reviewed.  Constitutional:      General: She is not in acute distress.    Appearance: Normal appearance. She is not ill-appearing or diaphoretic.  HENT:     Head: Normocephalic and atraumatic.     Right Ear: Tympanic membrane and ear canal normal.     Left Ear: Tympanic membrane and ear canal normal.     Mouth/Throat:     Mouth: Mucous membranes are moist.     Pharynx: Oropharynx is clear.  Eyes:     Conjunctiva/sclera: Conjunctivae normal.     Pupils: Pupils are equal, round, and reactive to light.  Cardiovascular:     Rate and Rhythm: Normal rate and regular rhythm.      Pulses: Normal pulses.     Heart sounds: Normal heart sounds.  Pulmonary:     Effort: Pulmonary effort is normal.     Breath sounds: Normal breath sounds.  Abdominal:     General: Bowel sounds are normal. There is no distension.     Palpations: Abdomen is soft.     Tenderness: There is no abdominal tenderness.  Neurological:     General: No focal deficit present.     Mental Status: She is alert and oriented to person, place, and time.     Cranial Nerves: No cranial nerve deficit.     Motor: No weakness.     Coordination: Coordination normal.     Gait: Gait normal.     Deep Tendon Reflexes: Reflexes normal.  Psychiatric:        Mood and Affect: Mood normal.     Recent Results (from the past 2160 hour(s))  Basic Metabolic Panel (BMET)     Status: Abnormal   Collection Time: 08/08/18  8:14 AM  Result Value Ref Range   Sodium 140 135 - 145 mEq/L   Potassium 3.3 (L) 3.5 - 5.1 mEq/L   Chloride 105 96 - 112 mEq/L   CO2 25 19 - 32 mEq/L   Glucose, Bld 103 (H) 70 - 99 mg/dL   BUN 11 6 - 23 mg/dL   Creatinine, Ser 1.10 0.40 - 1.20 mg/dL   Calcium 11.1 (H) 8.4 - 10.5 mg/dL   GFR 52.36 (L) >60.00 mL/min  CBC w/Diff     Status: Abnormal   Collection Time: 08/08/18  8:14 AM  Result Value Ref Range   WBC 7.3 4.0 - 10.5 K/uL   RBC 5.27 (H) 3.87 - 5.11 Mil/uL   Hemoglobin 15.0 12.0 - 15.0 g/dL   HCT 43.8 36.0 - 46.0 %  MCV 83.2 78.0 - 100.0 fl   MCHC 34.2 30.0 - 36.0 g/dL   RDW 13.7 11.5 - 15.5 %   Platelets 286.0 150.0 - 400.0 K/uL   Neutrophils Relative % 69.1 43.0 - 77.0 %   Lymphocytes Relative 23.2 12.0 - 46.0 %   Monocytes Relative 5.7 3.0 - 12.0 %   Eosinophils Relative 1.4 0.0 - 5.0 %   Basophils Relative 0.6 0.0 - 3.0 %   Neutro Abs 5.1 1.4 - 7.7 K/uL   Lymphs Abs 1.7 0.7 - 4.0 K/uL   Monocytes Absolute 0.4 0.1 - 1.0 K/uL   Eosinophils Absolute 0.1 0.0 - 0.7 K/uL   Basophils Absolute 0.0 0.0 - 0.1 K/uL  PTH, intact and calcium     Status: Abnormal   Collection  Time: 08/17/18 10:42 AM  Result Value Ref Range   PTH 5 (L) 14 - 64 pg/mL    Comment: . Interpretive Guide    Intact PTH           Calcium ------------------    ----------           ------- Normal Parathyroid    Normal               Normal Hypoparathyroidism    Low or Low Normal    Low Hyperparathyroidism    Primary            Normal or High       High    Secondary          High                 Normal or Low    Tertiary           High                 High Non-Parathyroid    Hypercalcemia      Low or Low Normal    High .    Calcium CANCELED     Comment: TEST NOT PERFORMED . No suitable specimen received.  Result canceled by the ancillary.   Basic metabolic panel     Status: Abnormal   Collection Time: 08/17/18 10:42 AM  Result Value Ref Range   Sodium 139 135 - 145 mEq/L   Potassium 3.3 (L) 3.5 - 5.1 mEq/L   Chloride 110 96 - 112 mEq/L   CO2 21 19 - 32 mEq/L   Glucose, Bld 77 70 - 99 mg/dL   BUN 11 6 - 23 mg/dL   Creatinine, Ser 1.06 0.40 - 1.20 mg/dL   Calcium 9.3 8.4 - 10.5 mg/dL   GFR 54.64 (L) >60.00 mL/min  TIQ-NTM     Status: None   Collection Time: 08/17/18 10:42 AM  Result Value Ref Range   QUESTION/PROBLEM:      Comment: . No test(s) are indicated on the requisition for the following specimen(s). .    SPECIMEN(S) RECEIVED: F     Comment: REQUESTED INFORMATION _________________________________ . AUTHORIZED SIGNATURE __________________________________ . TO PREVENT FURTHER DELAYS IN TESTING, PLEASE COMPLETE INFORMATION ABOVE AND FAX TO [phone] TO RESOLVE  THIS ORDER.   PTH, intact and calcium     Status: Abnormal   Collection Time: 08/23/18  2:16 PM  Result Value Ref Range   PTH 13 (L) 14 - 64 pg/mL    Comment: . Interpretive Guide    Intact PTH           Calcium ------------------    ----------           -------  Normal Parathyroid    Normal               Normal Hypoparathyroidism    Low or Low Normal    Low Hyperparathyroidism    Primary             Normal or High       High    Secondary          High                 Normal or Low    Tertiary           High                 High Non-Parathyroid    Hypercalcemia      Low or Low Normal    High .    Calcium 9.4 8.6 - 10.4 mg/dL  HM MAMMOGRAPHY     Status: None   Collection Time: 09/26/18 12:00 AM  Result Value Ref Range   HM Mammogram 0-4 Bi-Rad 0-4 Bi-Rad, Self Reported Normal  Urinalysis, Routine w reflex microscopic     Status: Abnormal   Collection Time: 10/25/18 12:53 PM  Result Value Ref Range   Color, Urine YELLOW YELLOW   APPearance CLEAR CLEAR   Specific Gravity, Urine 1.009 1.005 - 1.030   pH 6.0 5.0 - 8.0   Glucose, UA NEGATIVE NEGATIVE mg/dL   Hgb urine dipstick NEGATIVE NEGATIVE   Bilirubin Urine NEGATIVE NEGATIVE   Ketones, ur 5 (A) NEGATIVE mg/dL   Protein, ur NEGATIVE NEGATIVE mg/dL   Nitrite NEGATIVE NEGATIVE   Leukocytes,Ua LARGE (A) NEGATIVE   RBC / HPF 6-10 0 - 5 RBC/hpf   WBC, UA 11-20 0 - 5 WBC/hpf   Bacteria, UA RARE (A) NONE SEEN   Squamous Epithelial / LPF 0-5 0 - 5   Mucus PRESENT    Hyaline Casts, UA PRESENT     Comment: Performed at [facility], [place] [address], [place], [place] [address]  CBC with Differential/Platelet     Status: Abnormal   Collection Time: 10/25/18 12:53 PM  Result Value Ref Range   WBC 8.5 4.0 - 10.5 K/uL   RBC 5.54 (H) 3.87 - 5.11 MIL/uL   Hemoglobin 15.6 (H) 12.0 - 15.0 g/dL   HCT 47.5 (H) 36.0 - 46.0 %   MCV 85.7 80.0 - 100.0 fL   MCH 28.2 26.0 - 34.0 pg   MCHC 32.8 30.0 - 36.0 g/dL   RDW 13.1 11.5 - 15.5 %   Platelets 281 150 - 400 K/uL   nRBC 0.0 0.0 - 0.2 %   Neutrophils Relative % 72 %   Neutro Abs 6.1 1.7 - 7.7 K/uL   Lymphocytes Relative 19 %   Lymphs Abs 1.6 0.7 - 4.0 K/uL   Monocytes Relative 7 %   Monocytes Absolute 0.6 0.1 - 1.0 K/uL   Eosinophils Relative 1 %   Eosinophils Absolute 0.1 0.0 - 0.5 K/uL   Basophils Relative 1 %   Basophils Absolute 0.1 0.0 - 0.1 K/uL   Immature  Granulocytes 0 %   Abs Immature Granulocytes 0.02 0.00 - 0.07 K/uL    Comment: Performed at [facility], [place] [address] [place], [place] [address]  Comprehensive metabolic panel     Status: Abnormal   Collection Time: 10/25/18 12:53 PM  Result Value Ref Range   Sodium 139 135 - 145 mmol/L   Potassium 2.3 (LL) 3.5 -  5.1 mmol/L    Comment: REPEATED TO VERIFY CRITICAL RESULT CALLED TO, READ BACK BY AND VERIFIED WITH: LEONARD,S. RN AT 1349 10/25/18 MULLINS,T    Chloride 103 98 - 111 mmol/L   CO2 25 22 - 32 mmol/L   Glucose, Bld 95 70 - 99 mg/dL   BUN 9 8 - 23 mg/dL   Creatinine, Ser 1.22 (H) 0.44 - 1.00 mg/dL   Calcium 12.3 (H) 8.9 - 10.3 mg/dL   Total Protein 7.4 6.5 - 8.1 g/dL   Albumin 4.5 3.5 - 5.0 g/dL   AST 21 15 - 41 U/L   ALT 14 0 - 44 U/L   Alkaline Phosphatase 81 38 - 126 U/L   Total Bilirubin 0.9 0.3 - 1.2 mg/dL   GFR calc non Af Amer 45 (L) >60 mL/min   GFR calc Af Amer 52 (L) >60 mL/min   Anion gap 11 5 - 15    Comment: Performed at [facility], [place] [address], [place], [place] [address]  Digoxin level     Status: None   Collection Time: 10/25/18 12:53 PM  Result Value Ref Range   Digoxin Level 0.8 0.8 - 2.0 ng/mL    Comment: Performed at [facility], [place] [address] [place], [place] [address]  Magnesium     Status: None   Collection Time: 10/25/18 12:53 PM  Result Value Ref Range   Magnesium 1.7 1.7 - 2.4 mg/dL    Comment: Performed at [facility], [place] [address] [place], [place] [address]  I-stat troponin, ED     Status: None   Collection Time: 10/25/18 12:58 PM  Result Value Ref Range   Troponin i, poc 0.00 0.00 - 0.08 ng/mL   Comment 3            Comment: Due to the release kinetics of cTnI, a negative result within the first hours of the onset of symptoms does not rule out myocardial infarction with certainty. If myocardial infarction is still suspected, repeat the test at  appropriate intervals.   POCT I-Stat EG7     Status: Abnormal   Collection Time: 10/25/18  1:00 PM  Result Value Ref Range   pH, Ven 7.392 7.250 - 7.430   pCO2, Ven 47.6 44.0 - 60.0 mmHg   pO2, Ven 29.0 (LL) 32.0 - 45.0 mmHg   Bicarbonate 29.0 (H) 20.0 - 28.0 mmol/L   TCO2 30 22 - 32 mmol/L   O2 Saturation 54.0 %   Acid-Base Excess 3.0 (H) 0.0 - 2.0 mmol/L   Sodium 140 135 - 145 mmol/L   Potassium 2.5 (LL) 3.5 - 5.1 mmol/L   Calcium, Ion 1.56 (HH) 1.15 - 1.40 mmol/L   HCT 45.0 36.0 - 46.0 %   Hemoglobin 15.3 (H) 12.0 - 15.0 g/dL   Patient temperature HIDE    Sample type VENOUS    Comment NOTIFIED PHYSICIAN   CBG monitoring, ED     Status: Abnormal   Collection Time: 10/25/18  1:28 PM  Result Value Ref Range   Glucose-Capillary 57 (L) 70 - 99 mg/dL  CBG monitoring, ED     Status: Abnormal   Collection Time: 10/25/18  2:06 PM  Result Value Ref Range   Glucose-Capillary 61 (L) 70 - 99 mg/dL   Comment 1 Notify RN    Comment 2 Document in Chart    Comment 3 Call MD NNP PA CNM   CBG monitoring, ED     Status: None  Collection Time: 10/25/18  2:11 PM  Result Value Ref Range   Glucose-Capillary 74 70 - 99 mg/dL  Glucose, capillary     Status: None   Collection Time: 10/25/18  4:47 PM  Result Value Ref Range   Glucose-Capillary 73 70 - 99 mg/dL   Comment 1 Notify RN    Comment 2 Document in Chart   Glucose, capillary     Status: None   Collection Time: 10/25/18  9:17 PM  Result Value Ref Range   Glucose-Capillary 95 70 - 99 mg/dL  HIV antibody (Routine Testing)     Status: None   Collection Time: 10/26/18  3:00 AM  Result Value Ref Range   HIV Screen 4th Generation wRfx Non Reactive Non Reactive    Comment: (NOTE) Performed At: [facility] [address] [place], [place] [id number] Rush Farmer MD VF:[id number]   Cortisol-am, blood     Status: Abnormal   Collection Time: 10/26/18  3:00 AM  Result Value Ref Range   Cortisol - AM 1.8 (L) 6.7 - 22.6 ug/dL     Comment: Performed at [facility], [place] [address] [place], [place] [address]  Basic metabolic panel     Status: Abnormal   Collection Time: 10/26/18  3:00 AM  Result Value Ref Range   Sodium 142 135 - 145 mmol/L    Comment: REPEATED TO VERIFY   Potassium 3.9 3.5 - 5.1 mmol/L    Comment: DELTA CHECK NOTED REPEATED TO VERIFY NO VISIBLE HEMOLYSIS    Chloride 116 (H) 98 - 111 mmol/L    Comment: REPEATED TO VERIFY   CO2 21 (L) 22 - 32 mmol/L    Comment: REPEATED TO VERIFY   Glucose, Bld 97 70 - 99 mg/dL   BUN 7 (L) 8 - 23 mg/dL   Creatinine, Ser 1.09 (H) 0.44 - 1.00 mg/dL   Calcium 9.3 8.9 - 10.3 mg/dL    Comment: DELTA CHECK NOTED REPEATED TO VERIFY    GFR calc non Af Amer 52 (L) >60 mL/min   GFR calc Af Amer 60 (L) >60 mL/min   Anion gap 5 5 - 15    Comment: REPEATED TO VERIFY Performed at [facility], [place] [address] [place], [place] [address]   Glucose, capillary     Status: Abnormal   Collection Time: 10/26/18  7:13 AM  Result Value Ref Range   Glucose-Capillary 63 (L) 70 - 99 mg/dL   Comment 1 Notify RN    Comment 2 Document in Chart   ACTH stimulation, 3 time points     Status: None   Collection Time: 10/26/18  8:12 AM  Result Value Ref Range   Cortisol, Base 6.9 ug/dL    Comment: NO NORMAL RANGE ESTABLISHED FOR THIS TEST   Cortisol, 30 Min 16.4 ug/dL   Cortisol, 60 Min 21.4 ug/dL    Comment: Performed at [place] [address] [place], [place] [address]  Glucose, capillary     Status: None   Collection Time: 10/26/18 12:56 PM  Result Value Ref Range   Glucose-Capillary 83 70 - 99 mg/dL   Comment 1 Notify RN    Comment 2 Document in Chart   CBC with Differential/Platelet     Status: Abnormal   Collection Time: 11/01/18 12:09 PM  Result Value Ref Range   WBC 7.3 4.0 - 10.5 K/uL   RBC 5.12 (H) 3.87 - 5.11 Mil/uL   Hemoglobin 14.5 12.0 - 15.0 g/dL  HCT 43.0 36.0 - 46.0 %   MCV 84.0 78.0 - 100.0 fl   MCHC 33.6 30.0 - 36.0  g/dL   RDW 13.3 11.5 - 15.5 %   Platelets 292.0 150.0 - 400.0 K/uL   Neutrophils Relative % 68.6 43.0 - 77.0 %   Lymphocytes Relative 22.3 12.0 - 46.0 %   Monocytes Relative 6.9 3.0 - 12.0 %   Eosinophils Relative 1.2 0.0 - 5.0 %   Basophils Relative 1.0 0.0 - 3.0 %   Neutro Abs 5.0 1.4 - 7.7 K/uL   Lymphs Abs 1.6 0.7 - 4.0 K/uL   Monocytes Absolute 0.5 0.1 - 1.0 K/uL   Eosinophils Absolute 0.1 0.0 - 0.7 K/uL   Basophils Absolute 0.1 0.0 - 0.1 K/uL  Comprehensive metabolic panel     Status: Abnormal   Collection Time: 11/01/18 12:09 PM  Result Value Ref Range   Sodium 138 135 - 145 mEq/L   Potassium 3.9 3.5 - 5.1 mEq/L   Chloride 103 96 - 112 mEq/L   CO2 28 19 - 32 mEq/L   Glucose, Bld 94 70 - 99 mg/dL   BUN 9 6 - 23 mg/dL   Creatinine, Ser 1.04 0.40 - 1.20 mg/dL   Total Bilirubin 0.5 0.2 - 1.2 mg/dL   Alkaline Phosphatase 79 39 - 117 U/L   AST 16 0 - 37 U/L   ALT 13 0 - 35 U/L   Total Protein 6.6 6.0 - 8.3 g/dL   Albumin 4.3 3.5 - 5.2 g/dL   Calcium 11.2 (H) 8.4 - 10.5 mg/dL   GFR 52.52 (L) >60.00 mL/min  TSH     Status: Abnormal   Collection Time: 11/01/18 12:09 PM  Result Value Ref Range   TSH 0.33 (L) 0.35 - 4.50 uIU/mL    Assessment/Plan: 1. Orthostatic hypotension She remains orthostatic today. Repeat labs today, adding on TSH. Likely related to cortisol level. Appt scheduled with her Endocrinologist. Will add on low-dose Florinef for now to keep BP stable with positional changes. Dietary and hydration recommendations reviewed. - CBC with Differential/Platelet - Comprehensive metabolic panel - TSH  2. Hypoglycemia Noted during hospitalization. Dietary recommendations reviewed. Will recheck CMP and add-on insulin level.  - Comprehensive metabolic panel - Insulin, Free (Bioactive)-([facility])  3. Hypokalemia Repeat levels today. - Comprehensive metabolic panel  4. Hypercalcemia Repeat levels today. Transient hypercalcemia. PTH low but 2/2 to elevation of calcium.  Will schedule expedited appointment with Dr. Chalmers Cater for further assessment. Want to see if hormonal correlation or if there is concern for neoplasm. She does have scheduled repeat colonoscopy in a couple of weeks.  - PTH, intact (no Ca) - TSH  5. Anorexia 6. BMI less than 19,adult Denies any issues with body image. Notes she is just not hungry. Discussed with her that she is not giving herself enough nutrients to sustain health. Discussed utilizing Ensure or Boost. States she does not like the flavors. Recommended trying to mix with ice cream as she loves ice cream. For her, would be ok with her increasing salt intake for now to help with BP stability. She is to work on increased intake and follow-up with PCP for review.   7. Paroxysmal atrial fibrillation (HCC) NSR at time of visit. Asymptomatic. Continue care as directed by Cardiology.  8. Hypothyroidism, unspecified type Continue management per Dr. Chalmers Cater. Will recheck TSH level today.   Leeanne Rio, PA-C

## 2018-11-05 ENCOUNTER — Inpatient Hospital Stay: Payer: Medicare Other | Admitting: Family Medicine

## 2018-11-05 ENCOUNTER — Other Ambulatory Visit: Payer: Self-pay

## 2018-11-05 ENCOUNTER — Other Ambulatory Visit (INDEPENDENT_AMBULATORY_CARE_PROVIDER_SITE_OTHER): Payer: Medicare Other

## 2018-11-05 DIAGNOSIS — R7989 Other specified abnormal findings of blood chemistry: Secondary | ICD-10-CM

## 2018-11-05 DIAGNOSIS — E039 Hypothyroidism, unspecified: Secondary | ICD-10-CM

## 2018-11-05 LAB — T3, FREE: T3, Free: 3 pg/mL (ref 2.3–4.2)

## 2018-11-05 LAB — T4, FREE: Free T4: 1.2 ng/dL (ref 0.60–1.60)

## 2018-11-06 DIAGNOSIS — E039 Hypothyroidism, unspecified: Secondary | ICD-10-CM | POA: Diagnosis not present

## 2018-11-06 DIAGNOSIS — E559 Vitamin D deficiency, unspecified: Secondary | ICD-10-CM | POA: Diagnosis not present

## 2018-11-06 DIAGNOSIS — E2740 Unspecified adrenocortical insufficiency: Secondary | ICD-10-CM | POA: Diagnosis not present

## 2018-11-06 DIAGNOSIS — M899 Disorder of bone, unspecified: Secondary | ICD-10-CM | POA: Diagnosis not present

## 2018-11-07 DIAGNOSIS — E559 Vitamin D deficiency, unspecified: Secondary | ICD-10-CM | POA: Diagnosis not present

## 2018-11-07 DIAGNOSIS — E2740 Unspecified adrenocortical insufficiency: Secondary | ICD-10-CM | POA: Diagnosis not present

## 2018-11-08 LAB — PARATHYROID HORMONE, INTACT (NO CA): PTH: 1 pg/mL — ABNORMAL LOW (ref 14–64)

## 2018-11-08 LAB — INSULIN, FREE (BIOACTIVE): INSULIN, FREE: 11.3 u[IU]/mL (ref 1.5–14.9)

## 2018-11-15 ENCOUNTER — Other Ambulatory Visit: Payer: Self-pay | Admitting: Gastroenterology

## 2018-11-15 DIAGNOSIS — E2740 Unspecified adrenocortical insufficiency: Secondary | ICD-10-CM | POA: Diagnosis not present

## 2018-11-25 ENCOUNTER — Other Ambulatory Visit: Payer: Self-pay | Admitting: Family Medicine

## 2018-11-26 ENCOUNTER — Other Ambulatory Visit: Payer: Self-pay | Admitting: Family Medicine

## 2018-11-29 ENCOUNTER — Other Ambulatory Visit: Payer: Self-pay | Admitting: Physician Assistant

## 2018-12-03 ENCOUNTER — Telehealth: Payer: Self-pay

## 2018-12-03 NOTE — Telephone Encounter (Signed)
Spoke to patient she gave permission to change her 12/06/18 appointment with Dr.Jordan to a virtual visit.

## 2018-12-04 ENCOUNTER — Telehealth: Payer: Self-pay | Admitting: Cardiology

## 2018-12-04 NOTE — Telephone Encounter (Signed)
Call home phone/ my chart/ consented to virtual/ pre reg completed °

## 2018-12-05 NOTE — Progress Notes (Signed)
Virtual Visit via Telephone Note   This visit type was conducted due to national recommendations for restrictions regarding the COVID-19 Pandemic (e.g. social distancing) in an effort to limit this patient's exposure and mitigate transmission in our community.  Due to her co-morbid illnesses, this patient is at least at moderate risk for complications without adequate follow up.  This format is felt to be most appropriate for this patient at this time.  The patient did not have access to video technology/had technical difficulties with video requiring transitioning to audio format only (telephone).  All issues noted in this document were discussed and addressed.  No physical exam could be performed with this format.  Please refer to the patient's chart for her  consent to telehealth for [facility].   Evaluation Performed:  Follow-up visit  Date:  12/06/2018   ID:  Earnestene, Angello [birth year], MRN [id number]  Patient Location: Home Provider Location: Home  PCP:  Midge Minium, MD  Cardiologist:   Martinique MD Electrophysiologist:  None   Chief Complaint:  palpitations  History of Present Illness:    REE ALCALDE is a 68 y.o. female with history of paroxysmal atrial fibrillation, PAT, HTN, HLD, hyperthyroidism s/p thyroidectomy with subsequent hypothyroidism, asthma, GERD, fibromyalgia,  mild nonobstructive CAD.  She was admitted 9/22-9/23/16 with complaints of chest discomfort as well as symptoms of recurrent atrial fibrillation including dyspnea, weakness/fatigue, and palpitations with near-syncope. Labs were notable for significant hypokalemia of 2.5 which was repleted.  Troponins were negative. She underwent cath showing mild nonobstructive CAD with 30% dLAD, 40% D1, 25% pLCx, 30% mRCA, 30% RPDA, normal EF >65% with normal LVEDP.  Lasix was stopped due to her hypokalemia. Labs otherwise notable for LDL 140. She had a 30-day event monitor which showed some episodes  of PAT. No  AFib. She was started on Xarelto for [facility] of 3-4 (HTN, age, female, mild vascular disease). She was also started on atorvastatin. 2D echo 05/15/15: EF 77-41%, normal diastolic function. F/u labs 9/26 showed digoxin level 1.1 (digoxin decreased to 0.140m daily) and level came down to 0.6.   She was admitted in September 2018 with severe anemia and heme positive stool. Hgb down to 5.3. She was transfused. [facility] consulted and performed EGD and colonoscopy 9/6. EGD: Mildly severe Candida esophagitis, small hiatal hernia and normal examined duodenum. Colonoscopy: Melanosis, lipomatous ileocecal valve biopsied, 10 mm polyp in transverse colon resected, diverticulosis of sigmoid and descending colon, nonbleeding internal hemorrhoids. She had capsule endoscopy as an outpatient. This showed one small linear erosion. On repeat lab work Hgb improved to 15.   On our last visit in October we decided to discontinue her anticoagulation. She was unable to afford Xarelto and noted several falls. Since she had only one documented episode of afib this was felt to be reasonable.   She was admitted overnight on 10/25/18 for lightheadedness. She was orthostatic and dehydrated. Also hypokalemia. Potassium was repleted. Aldactone was discontinued and metoprolol was reduced. She was in NSR. Ecg showed diffuse repolarization abnormality which is chronic. Cortisol level was low. Appetite is poor. She was later started on Florinef.  On our visit today she reports she feels a lot better than one month ago. She is eating better and has gained 3 lbs. BP is still fluctuating. Her hardest time is when she first gets up in the am and BP is low. She notes Dr BChalmers Caterreduced her thyroid pill.    The patient does not  have symptoms concerning for COVID-19 infection (fever, chills, cough, or new shortness of breath). She is maintaining social distancing.    Past Medical History:  Diagnosis Date  . Anemia   . Asthma     - normal spirometry 2009 FEV1  >90% predicted.  - methacoline challenge neg 11/09  . Bronchitis    recurrent  . Cataract   . Chronic cough    - sinus CT 03-23-10 > neg.  - allergy profile 03-23-10 > nl, IgE 14.  - flutter valve rx 03-23-10  . Fibromyalgia   . GERD (gastroesophageal reflux disease)    exacerbating VCD  . Glaucoma   . Hyperlipidemia   . Hypertension   . Hyperthyroidism    with hot nodule.  - total thyroidectomy 11-05-08 benign -> subsequent hypothyroidism.  Marland Kitchen Hypokalemia   . Hypothyroidism   . Hypothyroidism    a. following thyroidectomy.  . Mild CAD    a. LHC 04/2015 - mild nonobstructive CAD with 30% dLAD, 40% D1, 25% pLCx, 30% mRCA, 30% RPDA, normal EF >65% with normal LVEDP.  . Orthostatic hypotension   . Osteoarthritis   . Paroxysmal atrial fibrillation (HCC)   . Renal insufficiency   . Vasomotor rhinitis    exacerbates VCD  . Vocal cord dysfunction    proven on FOB 9/09   Past Surgical History:  Procedure Laterality Date  . ABDOMINAL HYSTERECTOMY  1980  . ABDOMINAL HYSTERECTOMY    . BASAL CELL CARCINOMA EXCISION  11/08  . CARDIAC CATHETERIZATION N/A 05/15/2015   Procedure: Left Heart Cath and Coronary Angiography;  Surgeon: Sherren Mocha, MD;  Location: [place] [facility];  Service: Cardiovascular;  Laterality: N/A;  . COLONOSCOPY N/A 04/27/2017   Dr Silverio Decamp for scant rectal bleeding and iron def anemia: Melanosis coli, lipomatous IC valve, left sided tics, non-bleeding hemorrhoid, 10 mm transverse polyp.    . ESOPHAGOGASTRODUODENOSCOPY N/A 04/27/2017   Mauri Pole, MD; [facility]. for iron def anemia.  Monilial/candidial esophagitis. Small HH.    . EYE SURGERY    . scar tissue removal  1982  . THYROID SURGERY  2010  . THYROIDECTOMY  11-05-08     Current Meds  Medication Sig  . Acetaminophen (TYLENOL ARTHRITIS PAIN PO) Take 325 mg by mouth daily as needed (FOR MILD PAIN).   Marland Kitchen ALPRAZolam (XANAX) 0.5 MG tablet Take 1 tablet (0.5 mg total) by mouth 2  (two) times daily as needed for anxiety.  Marland Kitchen amitriptyline (ELAVIL) 50 MG tablet TAKE 1 TABLET(50 MG) BY MOUTH AT BEDTIME (Patient taking differently: Take 50 mg by mouth at bedtime. )  . atorvastatin (LIPITOR) 20 MG tablet TAKE 1 TABLET(20 MG) BY MOUTH DAILY (Patient taking differently: Take 20 mg by mouth daily at 6 PM. )  . blood glucose meter kit and supplies Dispense based on patient and insurance preference. Check blood sugar daily prior to eating breakfast or if signs/symptoms of hypoglycemia  . dicyclomine (BENTYL) 10 MG capsule TAKE 1 CAPSULE(10 MG) BY MOUTH THREE TIMES DAILY BEFORE MEALS FOR ABDOMINAL PAIN  . DIGOX 125 MCG tablet TAKE 1 TABLET BY MOUTH DAILY (Patient taking differently: Take 0.125 mg by mouth daily. )  . hydroxypropyl methylcellulose / hypromellose (ISOPTO TEARS / GONIOVISC) 2.5 % ophthalmic solution Place 2 drops into both eyes daily as needed for dry eyes.  . Lancets (ONETOUCH DELICA PLUS [id number]) MISC USE TO CHECK BLOOD SUGAR DAILY PRIOR TO EATING BREAKFAST OR IF SIGNS/SYMPTOMS OF HYPOGLYCEMIA OCCUR  . LEVOTHYROXINE SODIUM PO  Take 0.05 mg by mouth daily.  . metoprolol succinate (TOPROL-XL) 25 MG 24 hr tablet Take 0.5 tablets (12.5 mg total) by mouth daily.  . Multiple Vitamins-Minerals (MULTIVITAMIN ADULT PO) Take 1 tablet by mouth daily.  Glory Rosebush VERIO test strip USE TO CHECK BLOOD SUGAR DAILY PRIOR TO EATING BREAKFAST OR IF SIGNS/SYMPTOMS OF HYPOGLYCEMIA OCCUR  . potassium chloride SA (K-DUR,KLOR-CON) 20 MEQ tablet TAKE 1 TABLET(20 MEQ) BY MOUTH DAILY  . simethicone (MYLICON) 80 MG chewable tablet Chew 160 mg by mouth every 6 (six) hours as needed for flatulence.     Allergies:   Fenofibrate; Erythromycin; Guaifenesin er; and Sulfonamide derivatives   Social History   Tobacco Use  . Smoking status: Never Smoker  . Smokeless tobacco: Never Used  Substance Use Topics  . Alcohol use: Yes    Alcohol/week: 1.0 standard drinks    Types: 1 Glasses of wine per  week    Comment: occasional wine or beer  . Drug use: No     Family Hx: The patient's family history includes Asthma in her grandchild and mother; Emphysema in her father; Heart disease in her father and mother; Irregular heart beat in her brother.  ROS:   Please see the history of present illness.    All other systems reviewed and are negative.   Prior CV studies:   The following studies were reviewed today:   Labs/Other Tests and Data Reviewed:    EKG:  An ECG dated 10/25/18 was personally reviewed today and demonstrated:  NSR with diffuse ST-T wave abnormality- chronic.  Recent Labs: 10/25/2018: Magnesium 1.7 11/01/2018: ALT 13; BUN 9; Creatinine, Ser 1.04; Hemoglobin 14.5; Platelets 292.0; Potassium 3.9; Sodium 138; TSH 0.33   Recent Lipid Panel Lab Results  Component Value Date/Time   CHOL 132 08/01/2018 10:14 AM   TRIG 177.0 (H) 08/01/2018 10:14 AM   HDL 37.10 (L) 08/01/2018 10:14 AM   CHOLHDL 4 08/01/2018 10:14 AM   LDLCALC 60 08/01/2018 10:14 AM   LDLDIRECT 93.0 09/05/2017 08:50 AM    Wt Readings from Last 3 Encounters:  12/06/18 99 lb 3.2 oz (45 kg)  11/01/18 99 lb (44.9 kg)  10/25/18 98 lb (44.5 kg)     Objective:    Vital Signs:  BP 118/79   Pulse 73   Ht 5' 3.5" (1.613 m)   Wt 99 lb 3.2 oz (45 kg)   BMI 17.30 kg/m     ASSESSMENT & PLAN:    1. PAT with history of Pafib only once in 2016. Mostly PAT. Off anticoagulation given she has only had one documented episode of AFib and has a history of major GI bleed and falls.  Will continue dig and very low dose metoprolol. She reports pulse is doing well. Stress level is lower now that she is staying home.   2. Fatigue- chronic.  3. Hypothyroidism with recent dose adjustment. 4. Hyperlipidemia -  on atorvastatin 5. Mild CAD - continue risk reduction.  6. Orthostatic hypotension. Improved with medication changes noted and Florinef. Continue Rx   COVID-19 Education: The signs and symptoms of COVID-19 were  discussed with the patient and how to seek care for testing (follow up with PCP or arrange E-visit).  The importance of social distancing was discussed today.  Time:   Today, I have spent 8 minutes with the patient with telehealth technology discussing the above problems.     Medication Adjustments/Labs and Tests Ordered: Current medicines are reviewed at length with the patient today.  Concerns regarding medicines are outlined above.   Tests Ordered: No orders of the defined types were placed in this encounter.   Medication Changes: No orders of the defined types were placed in this encounter.   Disposition:  Follow up in 6 month(s)  Signed,  Martinique, MD  12/06/2018 8:08 AM    [facility]

## 2018-12-06 ENCOUNTER — Encounter: Payer: Self-pay | Admitting: Cardiology

## 2018-12-06 ENCOUNTER — Telehealth (INDEPENDENT_AMBULATORY_CARE_PROVIDER_SITE_OTHER): Payer: Medicare Other | Admitting: Cardiology

## 2018-12-06 ENCOUNTER — Ambulatory Visit: Payer: Medicare Other | Admitting: Cardiology

## 2018-12-06 VITALS — BP 118/79 | HR 73 | Ht 63.5 in | Wt 99.2 lb

## 2018-12-06 DIAGNOSIS — I10 Essential (primary) hypertension: Secondary | ICD-10-CM

## 2018-12-06 DIAGNOSIS — I471 Supraventricular tachycardia: Secondary | ICD-10-CM

## 2018-12-06 DIAGNOSIS — I951 Orthostatic hypotension: Secondary | ICD-10-CM

## 2018-12-06 NOTE — Patient Instructions (Signed)
Medication Instructions:  Continue same medications  If you need a refill on your cardiac medications before your next appointment, please call your pharmacy.   Lab work: None ordered    Testing/Procedures: None ordered  Follow-Up: At [facility], you and your health needs are our priority.  As part of our continuing mission to provide you with exceptional heart care, we have created designated Provider Care Teams.  These Care Teams include your primary Cardiologist (physician) and Advanced Practice Providers (APPs -  Physician Assistants and Nurse Practitioners) who all work together to provide you with the care you need, when you need it. . Schedule follow up visit with Dr.Jordan in 6 months     Call 3 months before to schedule

## 2018-12-13 ENCOUNTER — Other Ambulatory Visit: Payer: Self-pay | Admitting: Cardiology

## 2018-12-13 ENCOUNTER — Other Ambulatory Visit: Payer: Self-pay | Admitting: Family Medicine

## 2018-12-13 NOTE — Telephone Encounter (Signed)
Digoxin 0.125 mg refilled. 

## 2018-12-17 DIAGNOSIS — R634 Abnormal weight loss: Secondary | ICD-10-CM | POA: Diagnosis not present

## 2018-12-17 DIAGNOSIS — E876 Hypokalemia: Secondary | ICD-10-CM | POA: Diagnosis not present

## 2018-12-17 DIAGNOSIS — E2740 Unspecified adrenocortical insufficiency: Secondary | ICD-10-CM | POA: Diagnosis not present

## 2018-12-22 ENCOUNTER — Other Ambulatory Visit: Payer: Self-pay | Admitting: Gastroenterology

## 2018-12-24 DIAGNOSIS — E039 Hypothyroidism, unspecified: Secondary | ICD-10-CM | POA: Diagnosis not present

## 2018-12-26 ENCOUNTER — Other Ambulatory Visit: Payer: Self-pay | Admitting: Physician Assistant

## 2018-12-26 ENCOUNTER — Other Ambulatory Visit: Payer: Self-pay | Admitting: Family Medicine

## 2018-12-26 NOTE — Telephone Encounter (Signed)
Last OV 08/17/18 fluorinef last filled 11/01/18 #30 with 1by Elyn Aquas

## 2018-12-27 NOTE — Telephone Encounter (Signed)
Please advise ok to send in #[phone]?

## 2019-02-26 ENCOUNTER — Encounter: Payer: Self-pay | Admitting: Physician Assistant

## 2019-02-26 ENCOUNTER — Ambulatory Visit (INDEPENDENT_AMBULATORY_CARE_PROVIDER_SITE_OTHER): Payer: Medicare Other | Admitting: Physician Assistant

## 2019-02-26 ENCOUNTER — Other Ambulatory Visit: Payer: Self-pay

## 2019-02-26 ENCOUNTER — Telehealth: Payer: Self-pay | Admitting: Family Medicine

## 2019-02-26 VITALS — BP 134/80 | HR 55 | Temp 98.5°F | Resp 16 | Ht 64.0 in | Wt 104.1 lb

## 2019-02-26 DIAGNOSIS — R350 Frequency of micturition: Secondary | ICD-10-CM | POA: Diagnosis not present

## 2019-02-26 DIAGNOSIS — M546 Pain in thoracic spine: Secondary | ICD-10-CM

## 2019-02-26 LAB — POCT URINALYSIS DIPSTICK
Bilirubin, UA: NEGATIVE
Blood, UA: NEGATIVE
Glucose, UA: NEGATIVE
Ketones, UA: NEGATIVE
Leukocytes, UA: NEGATIVE
Nitrite, UA: NEGATIVE
Protein, UA: NEGATIVE
Spec Grav, UA: 1.015 (ref 1.010–1.025)
Urobilinogen, UA: 0.2 E.U./dL
pH, UA: 6 (ref 5.0–8.0)

## 2019-02-26 MED ORDER — MELOXICAM 15 MG PO TABS: 15.0000 mg | ORAL_TABLET | Freq: Every day | ORAL | 0 refills | Status: DC

## 2019-02-26 MED ORDER — DICYCLOMINE HCL 10 MG PO CAPS: ORAL_CAPSULE | ORAL | 0 refills | Status: DC

## 2019-02-26 MED ORDER — CYCLOBENZAPRINE HCL 5 MG PO TABS: 5.0000 mg | ORAL_TABLET | Freq: Every day | ORAL | 1 refills | Status: DC

## 2019-02-26 NOTE — Telephone Encounter (Signed)
[place] for in office appt due to frequent urination if screening questions are passed.

## 2019-02-26 NOTE — Telephone Encounter (Signed)
Are you ok seeing this pt in office if screening is passed?

## 2019-02-26 NOTE — Patient Instructions (Signed)
Please keep hydrated and rest. Avoid heavy lifting or overexertion. Take the Meloxicam once daily with food.  Tylenol for breakthrough pain. Use the Flexeril in the evening to calm things down.   Let me know if symptoms are not easing up. I will call you with culture results but the urinalysis is unremarkable.

## 2019-02-26 NOTE — Telephone Encounter (Signed)
Pt is scheduled °

## 2019-02-26 NOTE — Telephone Encounter (Signed)
That is fine with me.

## 2019-02-26 NOTE — Telephone Encounter (Signed)
Pt called in stating that she is having some back pain just above the waist. She is also having some frequent urination. She would like to come in for an in office visit. Pt can be reached at the home # or the cell #

## 2019-02-26 NOTE — Progress Notes (Signed)
Patient presents to clinic today c/o left sided thoracic back pain starting Sunday. Notes pain is constant and potentially worsening. Also feels pain around to lateral ribs. Denies trauma or injury but does note episodes of heavy lifting Friday. Notes pain of the area worsens with ROM of torso and palpation of the area. Denies cough, fever, chills, excessive SOB, nausea, vomiting. Reports staying well hydrated, drinks caffeine free products. Notes some urinary frequency over the past few days. Denies dysuria, urgency, hematuria.  Past Medical History:  Diagnosis Date  . Anemia   . Asthma    - normal spirometry 2009 FEV1  >90% predicted.  - methacoline challenge neg 11/09  . Bronchitis    recurrent  . Cataract   . Chronic cough    - sinus CT 03-23-10 > neg.  - allergy profile 03-23-10 > nl, IgE 14.  - flutter valve rx 03-23-10  . Fibromyalgia   . GERD (gastroesophageal reflux disease)    exacerbating VCD  . Glaucoma   . Hyperlipidemia   . Hypertension   . Hyperthyroidism    with hot nodule.  - total thyroidectomy 11-05-08 benign -> subsequent hypothyroidism.  Marland Kitchen Hypokalemia   . Hypothyroidism   . Hypothyroidism    a. following thyroidectomy.  . Mild CAD    a. LHC 04/2015 - mild nonobstructive CAD with 30% dLAD, 40% D1, 25% pLCx, 30% mRCA, 30% RPDA, normal EF >65% with normal LVEDP.  . Orthostatic hypotension   . Osteoarthritis   . Paroxysmal atrial fibrillation (HCC)   . Renal insufficiency   . Vasomotor rhinitis    exacerbates VCD  . Vocal cord dysfunction    proven on FOB 9/09    Current Outpatient Medications on File Prior to Visit  Medication Sig Dispense Refill  . Acetaminophen (TYLENOL ARTHRITIS PAIN PO) Take 325 mg by mouth daily as needed (FOR MILD PAIN).     Marland Kitchen ALPRAZolam (XANAX) 0.5 MG tablet Take 1 tablet (0.5 mg total) by mouth 2 (two) times daily as needed for anxiety. 60 tablet 3  . amitriptyline (ELAVIL) 50 MG tablet TAKE 1 TABLET(50 MG) BY MOUTH AT BEDTIME 90 tablet  0  . atorvastatin (LIPITOR) 20 MG tablet TAKE 1 TABLET(20 MG) BY MOUTH DAILY 90 tablet 0  . blood glucose meter kit and supplies Dispense based on patient and insurance preference. Check blood sugar daily prior to eating breakfast or if signs/symptoms of hypoglycemia 1 each 0  . dicyclomine (BENTYL) 10 MG capsule TAKE 1 CAPSULE(10 MG) BY MOUTH THREE TIMES DAILY BEFORE MEALS FOR ABDOMINAL PAIN 90 capsule 0  . digoxin (LANOXIN) 0.125 MG tablet TAKE 1 TABLET BY MOUTH DAILY 90 tablet 3  . fludrocortisone (FLORINEF) 0.1 MG tablet TAKE 1 TABLET(0.1 MG) BY MOUTH DAILY 90 tablet 1  . hydroxypropyl methylcellulose / hypromellose (ISOPTO TEARS / GONIOVISC) 2.5 % ophthalmic solution Place 2 drops into both eyes daily as needed for dry eyes.    . Lancets (ONETOUCH DELICA PLUS [id number]) MISC USE TO CHECK BLOOD SUGAR DAILY PRIOR TO EATING BREAKFAST OR IF SIGNS/SYMPTOMS OF HYPOGLYCEMIA OCCUR    . LEVOTHYROXINE SODIUM PO Take 0.05 mg by mouth daily.    . metoprolol succinate (TOPROL-XL) 25 MG 24 hr tablet TAKE 1 TABLET(25 MG) BY MOUTH DAILY 90 tablet 1  . Multiple Vitamins-Minerals (MULTIVITAMIN ADULT PO) Take 1 tablet by mouth daily.    Glory Rosebush VERIO test strip USE TO CHECK BLOOD SUGAR DAILY PRIOR TO EATING BREAKFAST OR IF SIGNS/SYMPTOMS OF HYPOGLYCEMIA  OCCUR    . potassium chloride SA (K-DUR,KLOR-CON) 20 MEQ tablet TAKE 1 TABLET(20 MEQ) BY MOUTH DAILY 30 tablet 0  . simethicone (MYLICON) 80 MG chewable tablet Chew 160 mg by mouth every 6 (six) hours as needed for flatulence.     No current facility-administered medications on file prior to visit.     Allergies  Allergen Reactions  . Fenofibrate Other (See Comments)    GI side effects   . Erythromycin Nausea And Vomiting    REACTION: nausea and vomiting  . Guaifenesin Er Palpitations  . Sulfonamide Derivatives Nausea And Vomiting    REACTION: nausea and vomiting    Family History  Problem Relation Age of Onset  . Asthma Mother   . Heart disease  Mother   . Emphysema Father   . Heart disease Father   . Irregular heart beat Brother   . Asthma Grandchild     Social History   Socioeconomic History  . Marital status: Married    Spouse name: Not on file  . Number of children: 1  . Years of education: Not on file  . Highest education level: Not on file  Occupational History  . Occupation: owns a Scientist, research (physical sciences)  . Occupation: OWNER    Employer: [employer]  . Financial resource strain: Not on file  . Food insecurity    Worry: Not on file    Inability: Not on file  . Transportation needs    Medical: Not on file    Non-medical: Not on file  Tobacco Use  . Smoking status: Never Smoker  . Smokeless tobacco: Never Used  Substance and Sexual Activity  . Alcohol use: Yes    Alcohol/week: 1.0 standard drinks    Types: 1 Glasses of wine per week    Comment: occasional wine or beer  . Drug use: No  . Sexual activity: Yes    Birth control/protection: None  Lifestyle  . Physical activity    Days per week: Not on file    Minutes per session: Not on file  . Stress: Not on file  Relationships  . Social Herbalist on phone: Not on file    Gets together: Not on file    Attends religious service: Not on file    Active member of club or organization: Not on file    Attends meetings of clubs or organizations: Not on file    Relationship status: Not on file  Other Topics Concern  . Not on file  Social History Narrative  . Not on file    Review of Systems - See HPI.  All other ROS are negative.  BP 134/80   Pulse (!) 55   Temp 98.5 F (36.9 C) (Skin)   Resp 16   Ht '5\' 4"'  (1.626 m)   Wt 104 lb 2 oz (47.2 kg)   SpO2 97%   BMI 17.87 kg/m   Physical Exam Vitals signs reviewed.  Constitutional:      Appearance: Normal appearance.  HENT:     Head: Normocephalic and atraumatic.  Cardiovascular:     Rate and Rhythm: Normal rate and regular rhythm.     Heart sounds: Normal heart  sounds.  Pulmonary:     Effort: Pulmonary effort is normal.     Breath sounds: Normal breath sounds.  Chest:     Chest wall: Tenderness (pain of left lateral lower intercostal spaces. No bony abnormality noted) present.  Musculoskeletal:  Thoracic back: She exhibits tenderness (L perispinal musculature. Worst just lateral to spine. ), pain and spasm. She exhibits normal range of motion.  Neurological:     Mental Status: She is alert.    Assessment/Plan: 1. Urine frequency UA unremarkable. Will send for culture giving history but in absence of other urinary symptoms or CVA tenderness, infection unlikely.  - POCT urinalysis dipstick - Urine Culture  2. Acute left-sided thoracic back pain Pain with ROM and palpation of L perispinal musculature and intercostal spaces of left lower ribs. No bony deformity noted. Rx Meloxicam 15 once daily. Tylenol for breakthrough pain. 5 mg Cyclobenzaprine at night. Supportive measures and OTC medications reviewed. Strict return precautions discussed today. - meloxicam (MOBIC) 15 MG tablet; Take 1 tablet (15 mg total) by mouth daily.  Dispense: 10 tablet; Refill: 0 - cyclobenzaprine (FLEXERIL) 5 MG tablet; Take 1 tablet (5 mg total) by mouth at bedtime.  Dispense: 15 tablet; Refill: [place], [place]

## 2019-02-28 ENCOUNTER — Other Ambulatory Visit: Payer: Self-pay | Admitting: Emergency Medicine

## 2019-02-28 DIAGNOSIS — N390 Urinary tract infection, site not specified: Secondary | ICD-10-CM

## 2019-02-28 LAB — URINE CULTURE
MICRO NUMBER:: 641585
SPECIMEN QUALITY:: ADEQUATE

## 2019-02-28 MED ORDER — CEPHALEXIN 500 MG PO CAPS: 500.0000 mg | ORAL_CAPSULE | Freq: Two times a day (BID) | ORAL | 0 refills | Status: AC

## 2019-03-09 ENCOUNTER — Other Ambulatory Visit: Payer: Self-pay | Admitting: Family Medicine

## 2019-03-19 DIAGNOSIS — E039 Hypothyroidism, unspecified: Secondary | ICD-10-CM | POA: Diagnosis not present

## 2019-03-19 DIAGNOSIS — M8589 Other specified disorders of bone density and structure, multiple sites: Secondary | ICD-10-CM | POA: Diagnosis not present

## 2019-03-19 DIAGNOSIS — E559 Vitamin D deficiency, unspecified: Secondary | ICD-10-CM | POA: Diagnosis not present

## 2019-03-26 DIAGNOSIS — M899 Disorder of bone, unspecified: Secondary | ICD-10-CM | POA: Diagnosis not present

## 2019-03-26 DIAGNOSIS — E039 Hypothyroidism, unspecified: Secondary | ICD-10-CM | POA: Diagnosis not present

## 2019-03-26 DIAGNOSIS — E876 Hypokalemia: Secondary | ICD-10-CM | POA: Diagnosis not present

## 2019-03-26 DIAGNOSIS — E2740 Unspecified adrenocortical insufficiency: Secondary | ICD-10-CM | POA: Diagnosis not present

## 2019-03-26 DIAGNOSIS — E559 Vitamin D deficiency, unspecified: Secondary | ICD-10-CM | POA: Diagnosis not present

## 2019-04-02 ENCOUNTER — Other Ambulatory Visit: Payer: Self-pay | Admitting: Physician Assistant

## 2019-05-14 ENCOUNTER — Other Ambulatory Visit: Payer: Self-pay | Admitting: General Practice

## 2019-05-14 MED ORDER — DICYCLOMINE HCL 10 MG PO CAPS: 10.0000 mg | ORAL_CAPSULE | Freq: Three times a day (TID) | ORAL | 0 refills | Status: DC

## 2019-06-03 ENCOUNTER — Other Ambulatory Visit: Payer: Self-pay | Admitting: General Practice

## 2019-06-03 MED ORDER — METOPROLOL SUCCINATE ER 25 MG PO TB24: ORAL_TABLET | ORAL | 1 refills | Status: DC

## 2019-06-03 MED ORDER — ATORVASTATIN CALCIUM 20 MG PO TABS: ORAL_TABLET | ORAL | 0 refills | Status: DC

## 2019-06-03 MED ORDER — FLUDROCORTISONE ACETATE 0.1 MG PO TABS: ORAL_TABLET | ORAL | 1 refills | Status: DC

## 2019-06-03 MED ORDER — AMITRIPTYLINE HCL 50 MG PO TABS: ORAL_TABLET | ORAL | 0 refills | Status: DC

## 2019-06-11 IMAGING — DX DG CHEST 2V
2 series · 2 of 2 positions shown · non-contrast
Comparison: Chest x-ray of May 14, 2015

CLINICAL DATA: Four weeks of shortness of breath. Onset of
nonproductive cough 2 days ago. History of episodes of bronchitis.
Nonsmoker. History of coronary artery disease.

EXAM:
CHEST  2 VIEW

[chest pa]
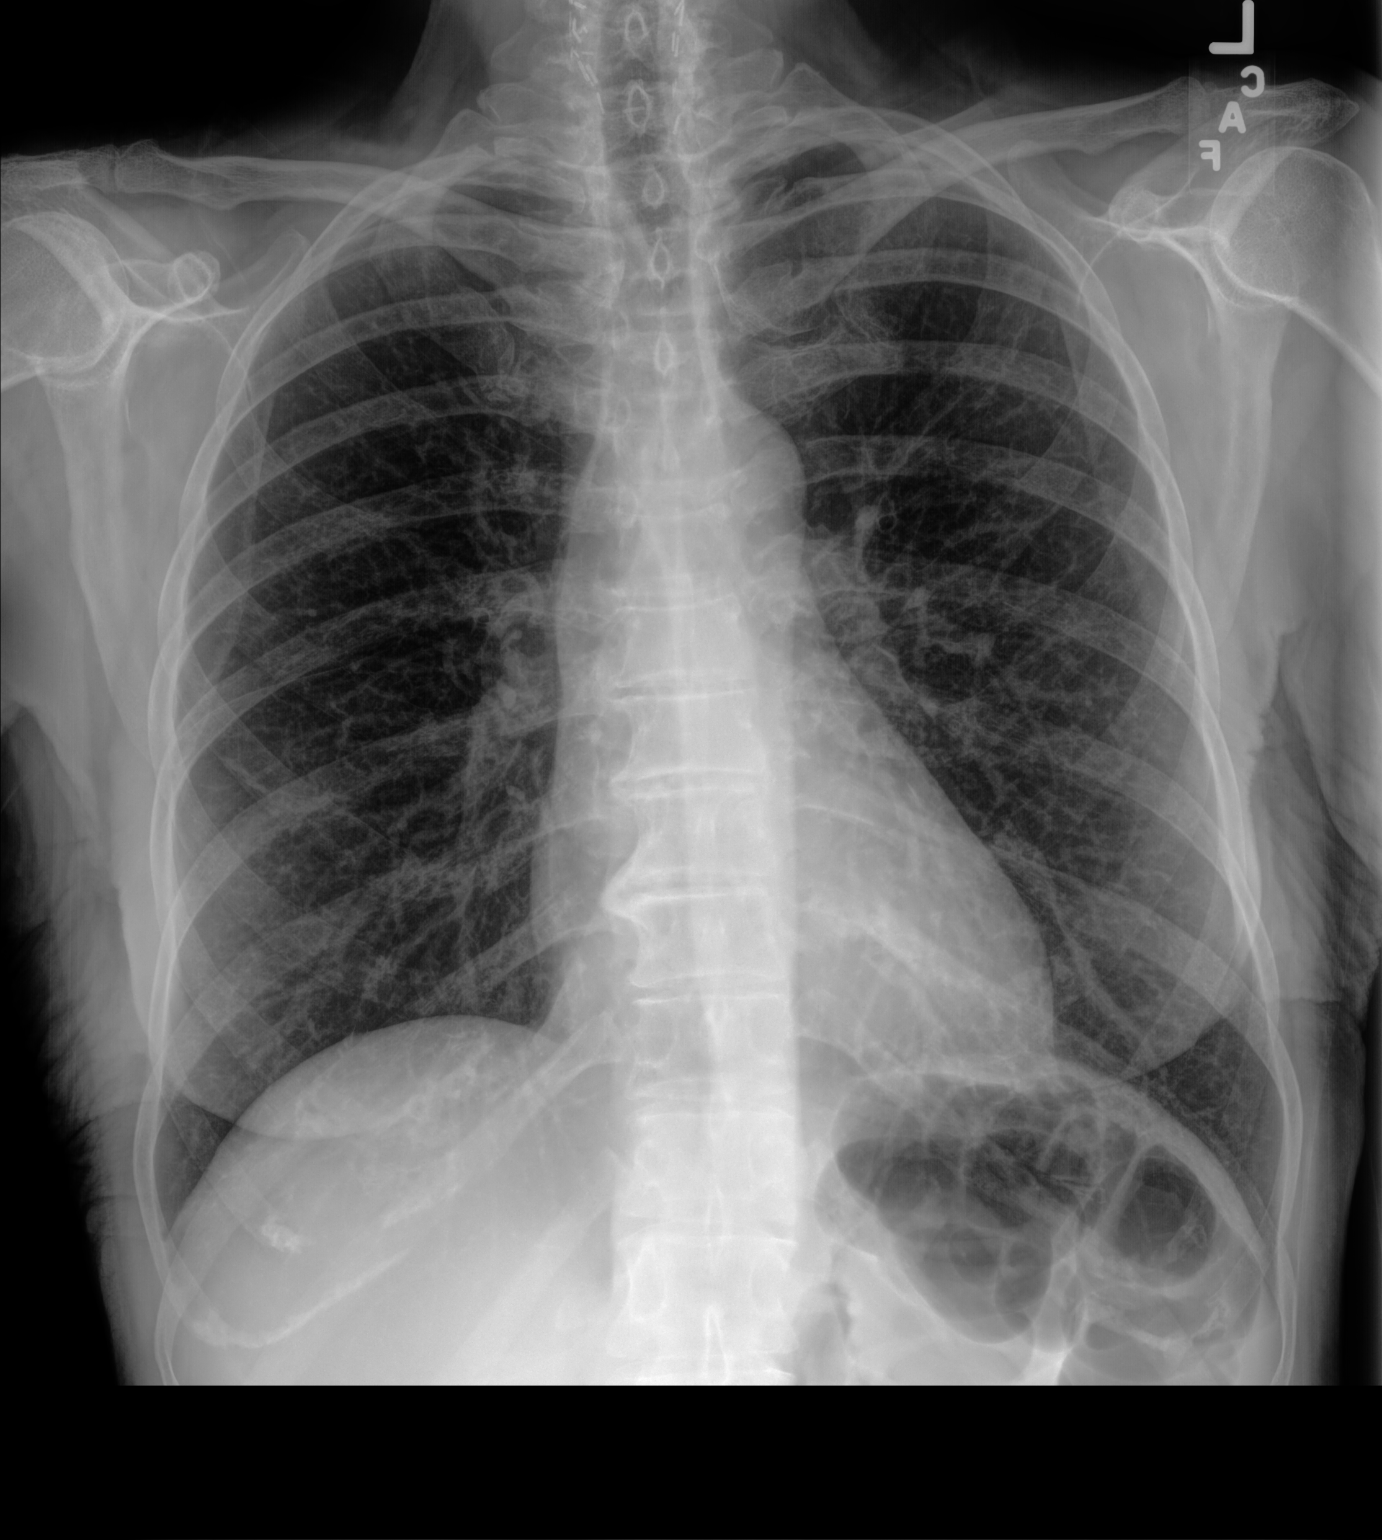

[chest lat]
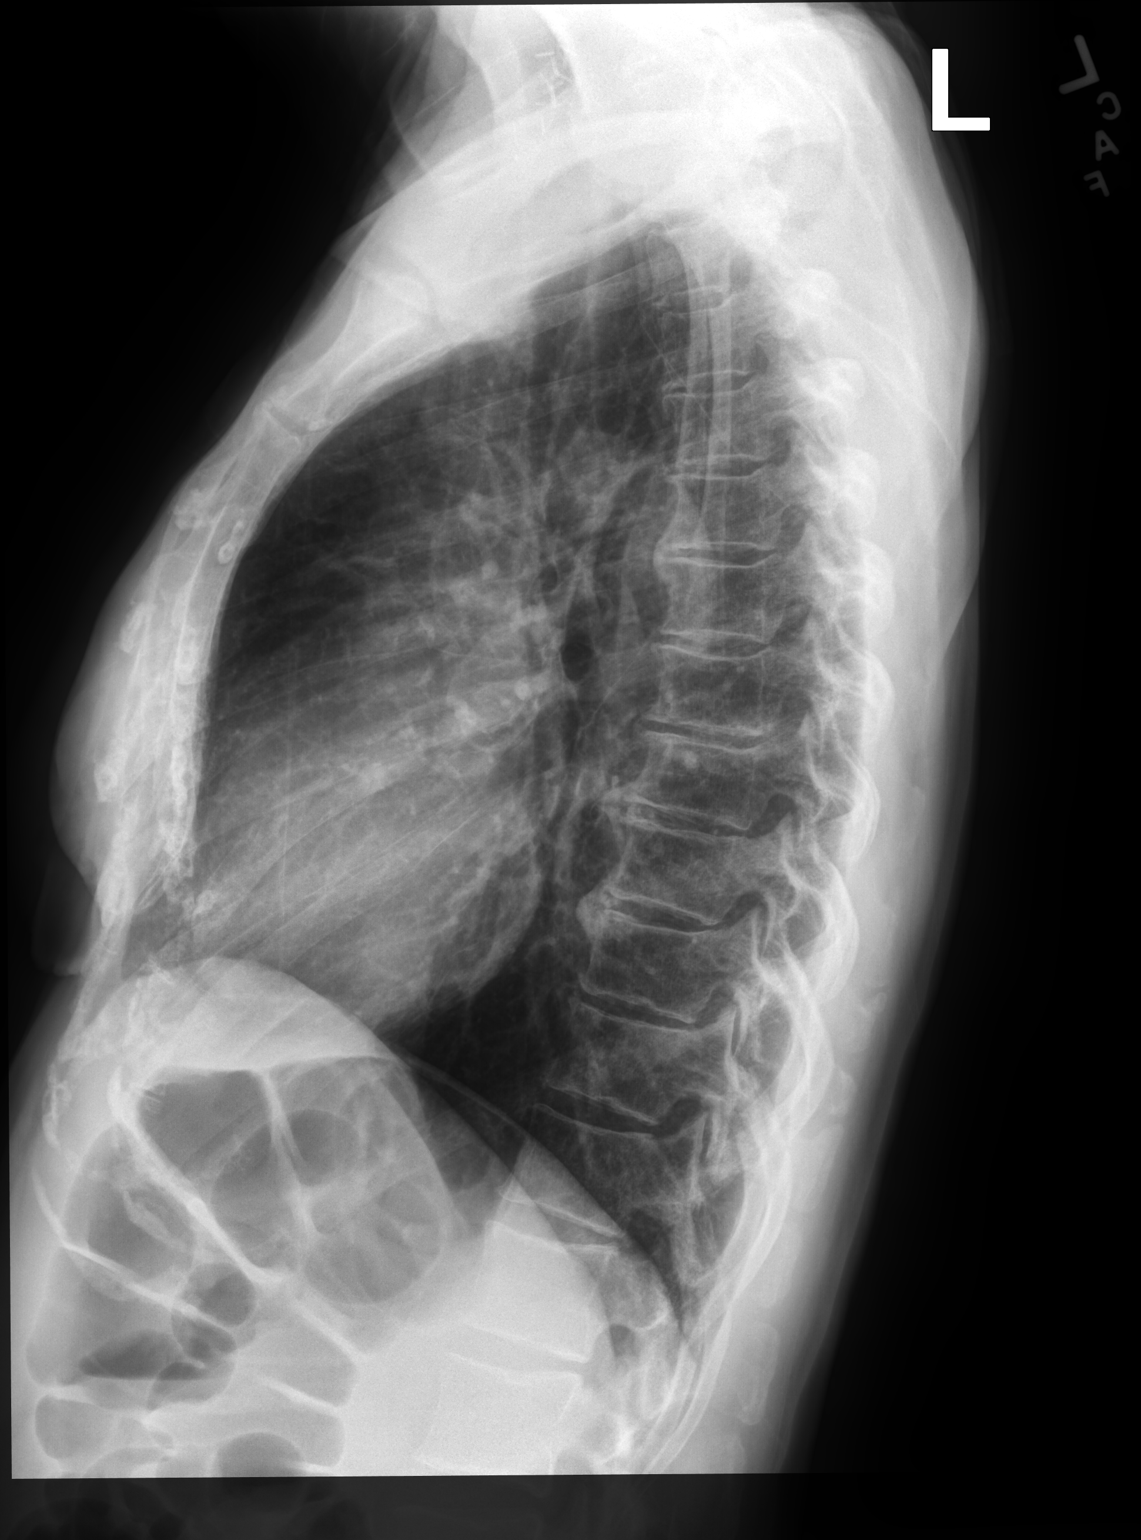

[2 of 2 positions shown; findings below may reference images not displayed]

FINDINGS: The lungs are well-expanded. The interstitial markings are coarse
and more conspicuous than on the previous study. There is no
alveolar infiltrate. There is no pleural effusion or pneumothorax.
The heart and pulmonary vascularity are normal. The mediastinum is
normal in width. The bony thorax exhibits no acute abnormality.
IMPRESSION: Increased interstitial markings may reflect acute bronchitis or
early interstitial pneumonia. There is no evidence of pulmonary
vascular congestion. Followup PA and lateral chest X-ray is
recommended in 3-4 weeks following trial of antibiotic therapy to
ensure resolution and exclude underlying malignancy.

## 2019-07-16 IMAGING — DX DG CHEST 2V
2 series · 2 of 2 positions shown · non-contrast
Comparison: Radiographs March 21, 2017.

CLINICAL DATA: Chest tightness.

EXAM:
CHEST  2 VIEW

[chest pa]
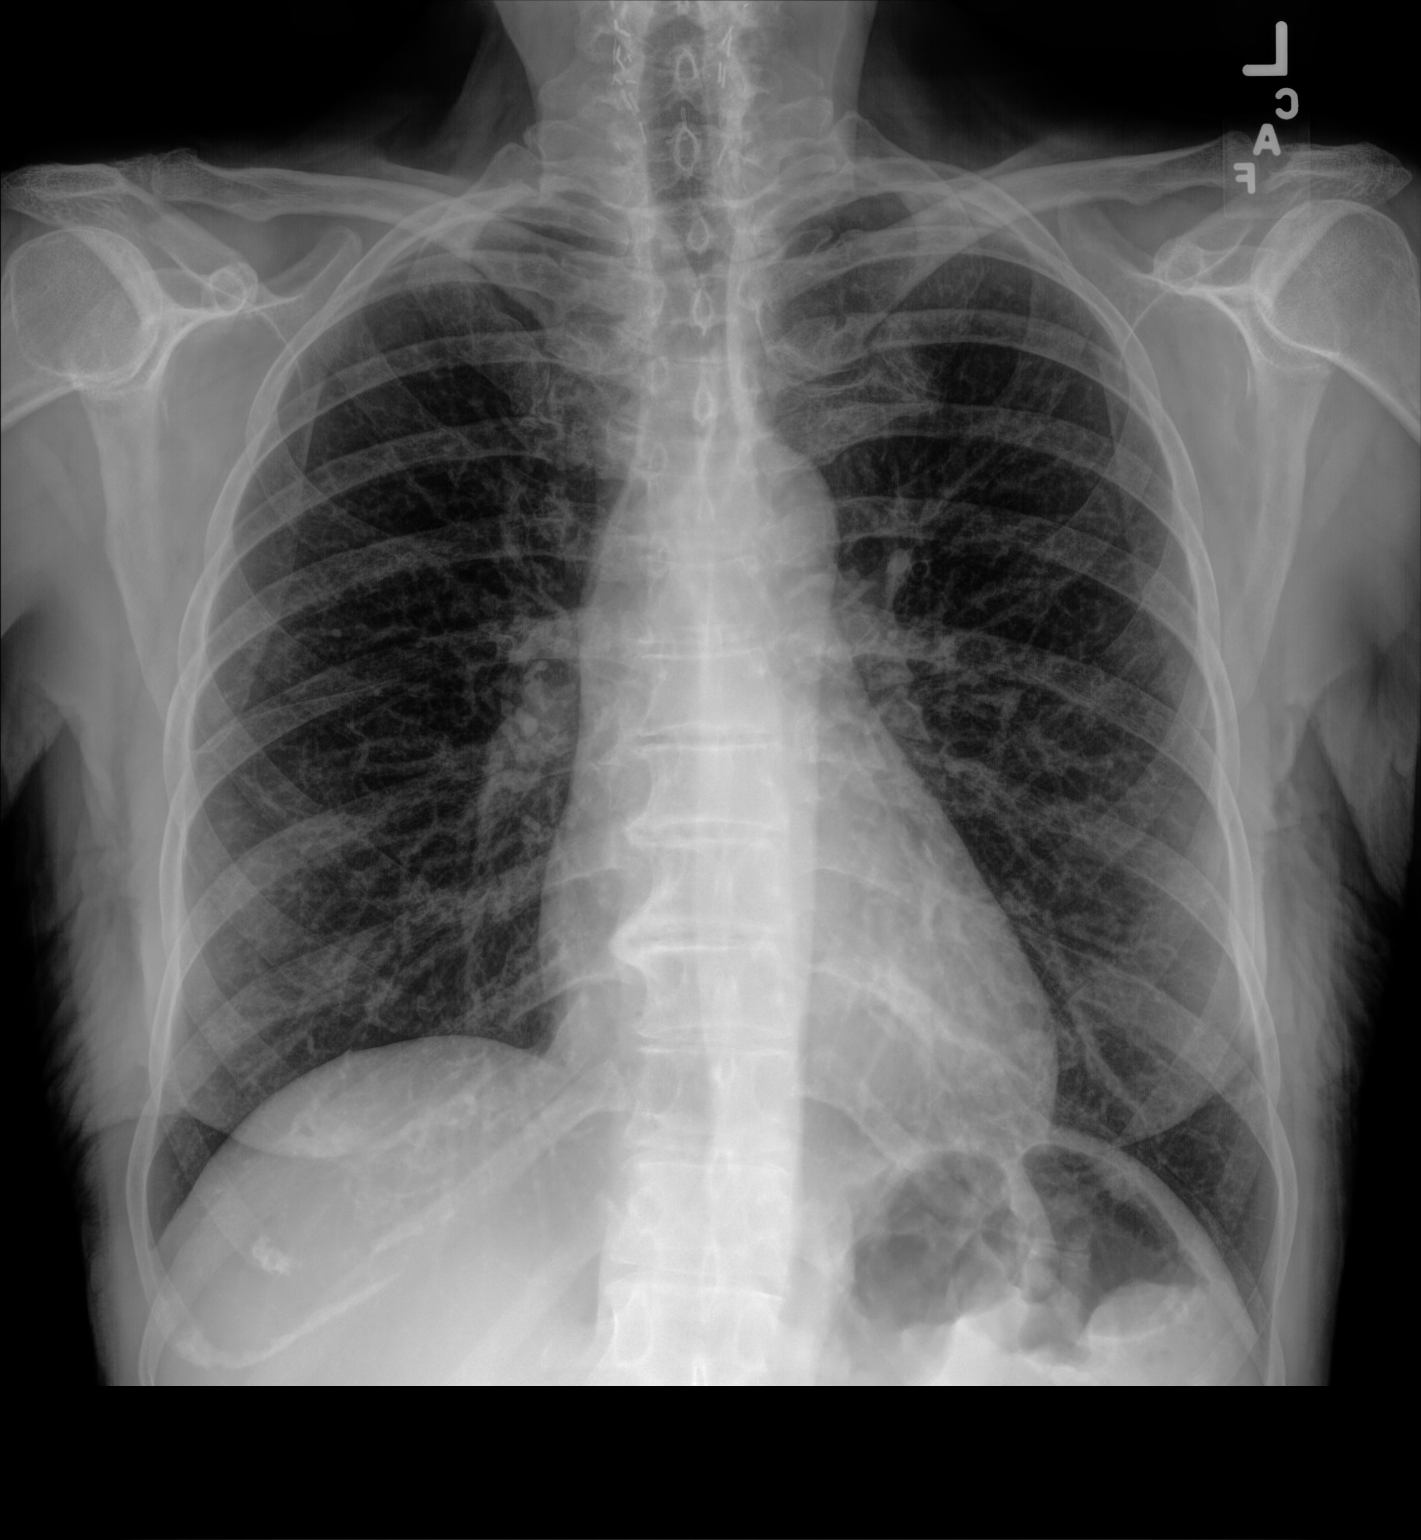

[chest lat]
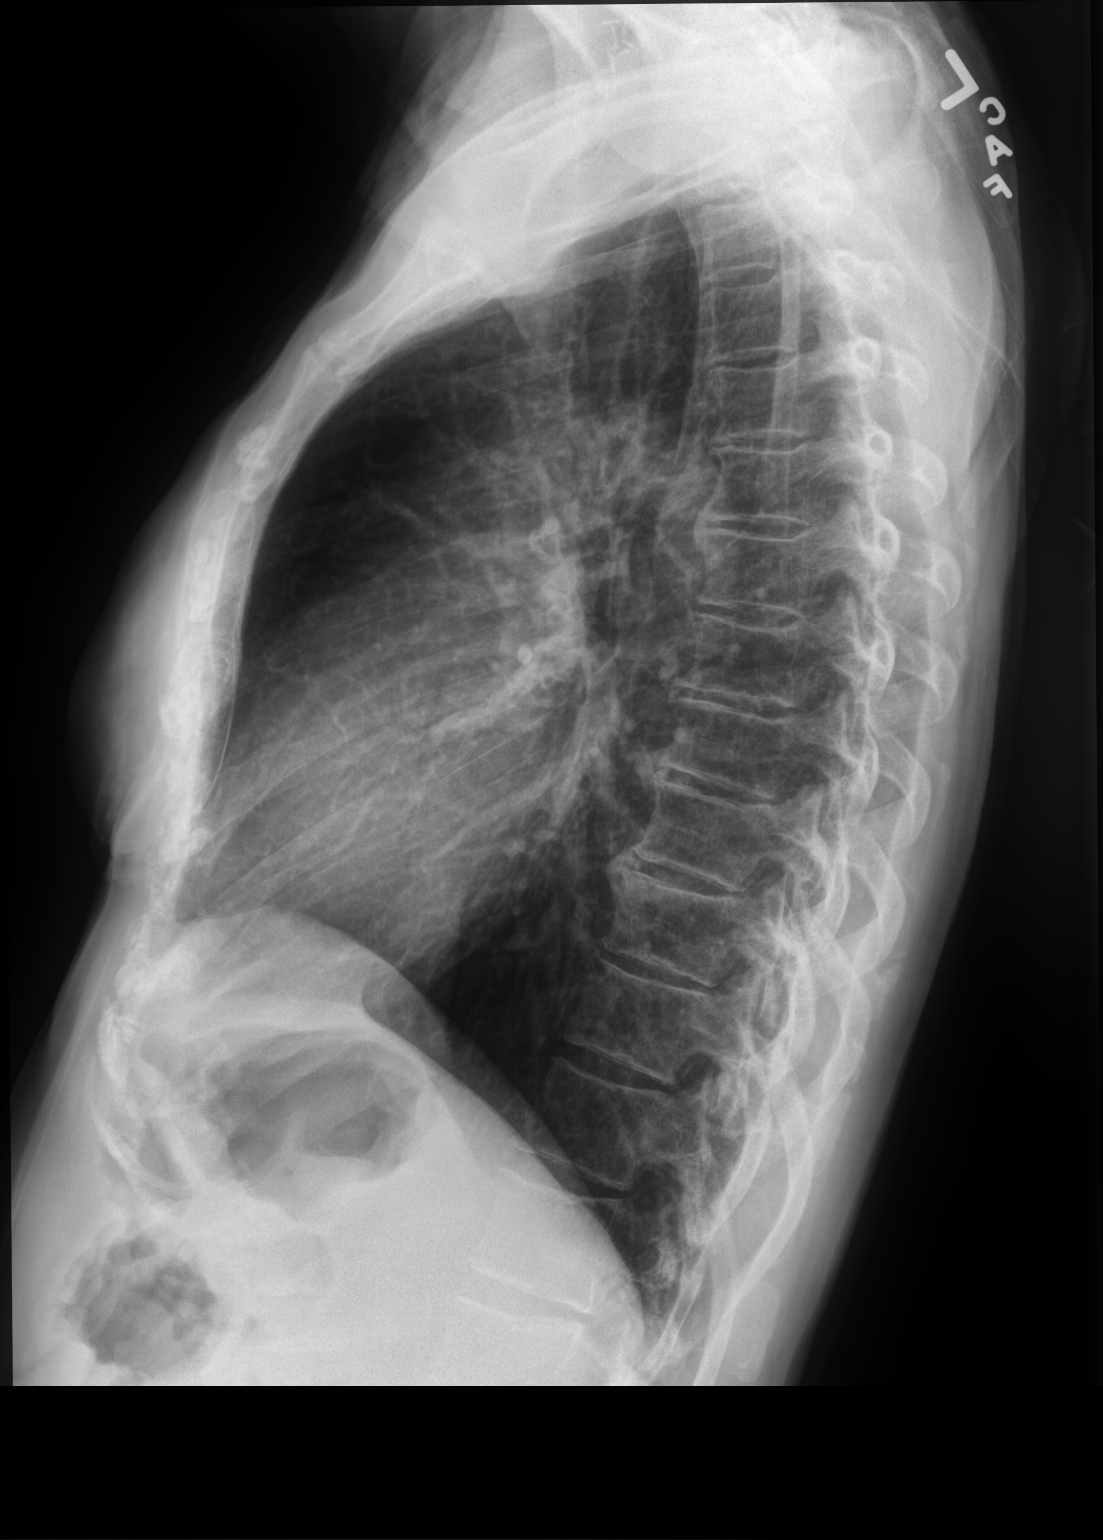

[2 of 2 positions shown; findings below may reference images not displayed]

FINDINGS: The heart size and mediastinal contours are within normal limits.
Both lungs are clear. No pneumothorax or pleural effusion is noted.
No pneumothorax or pleural effusion is noted. The visualized
skeletal structures are unremarkable.
IMPRESSION: No active cardiopulmonary disease.

## 2019-07-24 ENCOUNTER — Ambulatory Visit: Payer: Medicare Other

## 2019-08-26 ENCOUNTER — Other Ambulatory Visit: Payer: Self-pay | Admitting: *Deleted

## 2019-08-27 ENCOUNTER — Other Ambulatory Visit: Payer: Self-pay | Admitting: General Practice

## 2019-08-27 MED ORDER — ATORVASTATIN CALCIUM 20 MG PO TABS: ORAL_TABLET | ORAL | 0 refills | Status: DC

## 2019-08-27 MED ORDER — AMITRIPTYLINE HCL 50 MG PO TABS: ORAL_TABLET | ORAL | 0 refills | Status: DC

## 2019-09-24 ENCOUNTER — Other Ambulatory Visit: Payer: Self-pay | Admitting: Cardiology

## 2019-09-26 DIAGNOSIS — E039 Hypothyroidism, unspecified: Secondary | ICD-10-CM | POA: Diagnosis not present

## 2019-09-26 DIAGNOSIS — E559 Vitamin D deficiency, unspecified: Secondary | ICD-10-CM | POA: Diagnosis not present

## 2019-09-26 DIAGNOSIS — E2740 Unspecified adrenocortical insufficiency: Secondary | ICD-10-CM | POA: Diagnosis not present

## 2019-10-01 DIAGNOSIS — E876 Hypokalemia: Secondary | ICD-10-CM | POA: Diagnosis not present

## 2019-10-01 DIAGNOSIS — M899 Disorder of bone, unspecified: Secondary | ICD-10-CM | POA: Diagnosis not present

## 2019-10-01 DIAGNOSIS — E559 Vitamin D deficiency, unspecified: Secondary | ICD-10-CM | POA: Diagnosis not present

## 2019-10-01 DIAGNOSIS — E2740 Unspecified adrenocortical insufficiency: Secondary | ICD-10-CM | POA: Diagnosis not present

## 2019-10-01 DIAGNOSIS — E039 Hypothyroidism, unspecified: Secondary | ICD-10-CM | POA: Diagnosis not present

## 2019-10-02 ENCOUNTER — Telehealth: Payer: Medicare Other | Admitting: Physician Assistant

## 2019-10-02 ENCOUNTER — Ambulatory Visit (INDEPENDENT_AMBULATORY_CARE_PROVIDER_SITE_OTHER): Payer: Medicare Other

## 2019-10-02 ENCOUNTER — Other Ambulatory Visit: Payer: Self-pay

## 2019-10-02 ENCOUNTER — Encounter: Payer: Self-pay | Admitting: Family Medicine

## 2019-10-02 ENCOUNTER — Ambulatory Visit (INDEPENDENT_AMBULATORY_CARE_PROVIDER_SITE_OTHER): Payer: Medicare Other | Admitting: Family Medicine

## 2019-10-02 VITALS — BP 116/62 | HR 82 | Temp 98.5°F | Ht 64.0 in | Wt 107.2 lb

## 2019-10-02 DIAGNOSIS — E785 Hyperlipidemia, unspecified: Secondary | ICD-10-CM

## 2019-10-02 DIAGNOSIS — M1990 Unspecified osteoarthritis, unspecified site: Secondary | ICD-10-CM | POA: Diagnosis not present

## 2019-10-02 DIAGNOSIS — Z1231 Encounter for screening mammogram for malignant neoplasm of breast: Secondary | ICD-10-CM | POA: Diagnosis not present

## 2019-10-02 DIAGNOSIS — E440 Moderate protein-calorie malnutrition: Secondary | ICD-10-CM | POA: Diagnosis not present

## 2019-10-02 DIAGNOSIS — Z0000 Encounter for general adult medical examination without abnormal findings: Secondary | ICD-10-CM | POA: Diagnosis not present

## 2019-10-02 DIAGNOSIS — I480 Paroxysmal atrial fibrillation: Secondary | ICD-10-CM | POA: Diagnosis not present

## 2019-10-02 DIAGNOSIS — I951 Orthostatic hypotension: Secondary | ICD-10-CM | POA: Diagnosis not present

## 2019-10-02 DIAGNOSIS — Z23 Encounter for immunization: Secondary | ICD-10-CM

## 2019-10-02 LAB — BASIC METABOLIC PANEL
BUN: 10 mg/dL (ref 6–23)
CO2: 27 mEq/L (ref 19–32)
Calcium: 9.7 mg/dL (ref 8.4–10.5)
Chloride: 107 mEq/L (ref 96–112)
Creatinine, Ser: 1.05 mg/dL (ref 0.40–1.20)
GFR: 51.8 mL/min — ABNORMAL LOW (ref >60.00)
Glucose, Bld: 78 mg/dL (ref 70–99)
Potassium: 4.5 mEq/L (ref 3.5–5.1)
Sodium: 139 mEq/L (ref 135–145)

## 2019-10-02 LAB — CBC WITH DIFFERENTIAL/PLATELET
Basophils Absolute: 0 10*3/uL (ref 0.0–0.1)
Basophils Relative: 0.9 % (ref 0.0–3.0)
Eosinophils Absolute: 0.1 10*3/uL (ref 0.0–0.7)
Eosinophils Relative: 0.9 % (ref 0.0–5.0)
HCT: 45.7 % (ref 36.0–46.0)
Hemoglobin: 15 g/dL (ref 12.0–15.0)
Lymphocytes Relative: 24.9 % (ref 12.0–46.0)
Lymphs Abs: 1.4 10*3/uL (ref 0.7–4.0)
MCHC: 32.9 g/dL (ref 30.0–36.0)
MCV: 87.2 fl (ref 78.0–100.0)
Monocytes Absolute: 0.4 10*3/uL (ref 0.1–1.0)
Monocytes Relative: 6.9 % (ref 3.0–12.0)
Neutro Abs: 3.8 10*3/uL (ref 1.4–7.7)
Neutrophils Relative %: 66.4 % (ref 43.0–77.0)
Platelets: 276 10*3/uL (ref 150.0–400.0)
RBC: 5.24 Mil/uL — ABNORMAL HIGH (ref 3.87–5.11)
RDW: 13.6 % (ref 11.5–15.5)
WBC: 5.8 10*3/uL (ref 4.0–10.5)

## 2019-10-02 LAB — HEPATIC FUNCTION PANEL
ALT: 16 U/L (ref 0–35)
AST: 19 U/L (ref 0–37)
Albumin: 4.4 g/dL (ref 3.5–5.2)
Alkaline Phosphatase: 71 U/L (ref 39–117)
Bilirubin, Direct: 0.1 mg/dL (ref 0.0–0.3)
Total Bilirubin: 0.4 mg/dL (ref 0.2–1.2)
Total Protein: 6.8 g/dL (ref 6.0–8.3)

## 2019-10-02 LAB — LIPID PANEL
Cholesterol: 165 mg/dL (ref 0–200)
HDL: 53.2 mg/dL (ref >39.00)
LDL Cholesterol: 80 mg/dL (ref 0–99)
NonHDL: 111.54
Total CHOL/HDL Ratio: 3
Triglycerides: 157 mg/dL — ABNORMAL HIGH (ref 0.0–149.0)
VLDL: 31.4 mg/dL (ref 0.0–40.0)

## 2019-10-02 LAB — HM MAMMOGRAPHY

## 2019-10-02 NOTE — Assessment & Plan Note (Signed)
Chronic problem.  Tolerating statin w/o difficulty.  Check labs.  Adjust meds prn  

## 2019-10-02 NOTE — Assessment & Plan Note (Signed)
Following w/ Dr Martinique.  On Digoxin and Metoprolol.  Currently asymptomatic.  Will follow along.

## 2019-10-02 NOTE — Progress Notes (Signed)
   Subjective:    Patient ID: Kaitlyn Adams, female    DOB: [birth year], 69 y.o.   MRN: [id number]  HPI Hyperlipidemia- chronic problem, on Lipitor 20mg  daily.  No abd pain, N/V  Protein Calorie Malnutrition- pt has gained 10 lbs in last 14 months  Afib- following w/ Dr Martinique, on Digoxin and Metoprolol 1/2 tab daily.  No CP.  Continued mild SOB.  Hypotension- pt take Florinef daily to support blood pressure.  Much less dizziness than before.  Osteoarthritis- pt reports increased arthritis pain 'all over'.  Is using tylenol arthritis sparingly.     Review of Systems For ROS see HPI   This visit occurred during the SARS-CoV-2 public health emergency.  Safety protocols were in place, including screening questions prior to the visit, additional usage of staff PPE, and extensive cleaning of exam room while observing appropriate contact time as indicated for disinfecting solutions.       Objective:   Physical Exam Vitals reviewed.  Constitutional:      General: She is not in acute distress.    Appearance: Normal appearance. She is well-developed.  HENT:     Head: Normocephalic and atraumatic.  Eyes:     Conjunctiva/sclera: Conjunctivae normal.     Pupils: Pupils are equal, round, and reactive to light.  Neck:     Thyroid: No thyromegaly.  Cardiovascular:     Rate and Rhythm: Normal rate and regular rhythm.     Heart sounds: Normal heart sounds. No murmur.  Pulmonary:     Effort: Pulmonary effort is normal. No respiratory distress.     Breath sounds: Normal breath sounds.  Abdominal:     General: There is no distension.     Palpations: Abdomen is soft.     Tenderness: There is no abdominal tenderness.  Musculoskeletal:     Cervical back: Normal range of motion and neck supple.  Lymphadenopathy:     Cervical: No cervical adenopathy.  Skin:    General: Skin is warm and dry.  Neurological:     Mental Status: She is alert and oriented to person, place, and time.    Psychiatric:        Behavior: Behavior normal.           Assessment & Plan:

## 2019-10-02 NOTE — Patient Instructions (Signed)
Ms. Kaitlyn Adams , Thank you for taking time to come for your Medicare Wellness Visit. I appreciate your ongoing commitment to your health goals. Please review the following plan we discussed and let me know if I can assist you in the future.   Screening recommendations/referrals: Colorectal Screening: up to date; last colonoscopy 06/01/17  Mammogram: up to date; last 09/26/18 Bone Density: up to date; 03/19/19  Vision and Dental Exams: Recommended annual ophthalmology exams for early detection of glaucoma and other disorders of the eye Recommended annual dental exams for proper oral hygiene  Vaccinations: Influenza vaccine: today   Pneumococcal vaccine: up to date; 07/06/17 Tdap vaccine: up to date; last 07/10/12 Shingles vaccine: Please call your insurance company to determine your out of pocket expense for the Shingrix vaccine. You may receive this vaccine at your local pharmacy. (see handout)   Advanced directives: We have received a copy of your POA (Power of [place]) and/or Living Will. These documents can be located in your chart.  Goals: Recommend to drink at least 6-8 8oz glasses of water per day and consume a balanced diet rich in fresh fruits and vegetables.    Next appointment: Please schedule your Annual Wellness Visit with your Nurse Health Advisor in one year.  Preventive Care 19 Years and Older, Female Preventive care refers to lifestyle choices and visits with your health care provider that can promote health and wellness. What does preventive care include?  A yearly physical exam. This is also called an annual well check.  Dental exams once or twice a year.  Routine eye exams. Ask your health care provider how often you should have your eyes checked.  Personal lifestyle choices, including:  Daily care of your teeth and gums.  Regular physical activity.  Eating a healthy diet.  Avoiding tobacco and drug use.  Limiting alcohol use.  Practicing safe  sex.  Taking low-dose aspirin every day if recommended by your health care provider.  Taking vitamin and mineral supplements as recommended by your health care provider. What happens during an annual well check? The services and screenings done by your health care provider during your annual well check will depend on your age, overall health, lifestyle risk factors, and family history of disease. Counseling  Your health care provider may ask you questions about your:  Alcohol use.  Tobacco use.  Drug use.  Emotional well-being.  Home and relationship well-being.  Sexual activity.  Eating habits.  History of falls.  Memory and ability to understand (cognition).  Work and work Statistician.  Reproductive health. Screening  You may have the following tests or measurements:  Height, weight, and BMI.  Blood pressure.  Lipid and cholesterol levels. These may be checked every 5 years, or more frequently if you are over 35 years old.  Skin check.  Lung cancer screening. You may have this screening every year starting at age 26 if you have a 30-pack-year history of smoking and currently smoke or have quit within the past 15 years.  Fecal occult blood test (FOBT) of the stool. You may have this test every year starting at age 59.  Flexible sigmoidoscopy or colonoscopy. You may have a sigmoidoscopy every 5 years or a colonoscopy every 10 years starting at age 5.  Hepatitis C blood test.  Hepatitis B blood test.  Sexually transmitted disease (STD) testing.  Diabetes screening. This is done by checking your blood sugar (glucose) after you have not eaten for a while (fasting). You may have this  done every 1-3 years.  Bone density scan. This is done to screen for osteoporosis. You may have this done starting at age 43.  Mammogram. This may be done every 1-2 years. Talk to your health care provider about how often you should have regular mammograms. Talk with your health  care provider about your test results, treatment options, and if necessary, the need for more tests. Vaccines  Your health care provider may recommend certain vaccines, such as:  Influenza vaccine. This is recommended every year.  Tetanus, diphtheria, and acellular pertussis (Tdap, Td) vaccine. You may need a Td booster every 10 years.  Zoster vaccine. You may need this after age 72.  Pneumococcal 13-valent conjugate (PCV13) vaccine. One dose is recommended after age 52.  Pneumococcal polysaccharide (PPSV23) vaccine. One dose is recommended after age 68. Talk to your health care provider about which screenings and vaccines you need and how often you need them. This information is not intended to replace advice given to you by your health care provider. Make sure you discuss any questions you have with your health care provider. Document Released: 09/04/2015 Document Revised: 04/27/2016 Document Reviewed: 06/09/2015 Elsevier Interactive Patient Education  2017 [employer] Prevention in the Home Falls can cause injuries. They can happen to people of all ages. There are many things you can do to make your home safe and to help prevent falls. What can I do on the outside of my home?  Regularly fix the edges of walkways and driveways and fix any cracks.  Remove anything that might make you trip as you walk through a door, such as a raised step or threshold.  Trim any bushes or trees on the path to your home.  Use bright outdoor lighting.  Clear any walking paths of anything that might make someone trip, such as rocks or tools.  Regularly check to see if handrails are loose or broken. Make sure that both sides of any steps have handrails.  Any raised decks and porches should have guardrails on the edges.  Have any leaves, snow, or ice cleared regularly.  Use sand or salt on walking paths during winter.  Clean up any spills in your garage right away. This includes oil or  grease spills. What can I do in the bathroom?  Use night lights.  Install grab bars by the toilet and in the tub and shower. Do not use towel bars as grab bars.  Use non-skid mats or decals in the tub or shower.  If you need to sit down in the shower, use a plastic, non-slip stool.  Keep the floor dry. Clean up any water that spills on the floor as soon as it happens.  Remove soap buildup in the tub or shower regularly.  Attach bath mats securely with double-sided non-slip rug tape.  Do not have throw rugs and other things on the floor that can make you trip. What can I do in the bedroom?  Use night lights.  Make sure that you have a light by your bed that is easy to reach.  Do not use any sheets or blankets that are too big for your bed. They should not hang down onto the floor.  Have a firm chair that has side arms. You can use this for support while you get dressed.  Do not have throw rugs and other things on the floor that can make you trip. What can I do in the kitchen?  Clean up any spills  right away.  Avoid walking on wet floors.  Keep items that you use a lot in easy-to-reach places.  If you need to reach something above you, use a strong step stool that has a grab bar.  Keep electrical cords out of the way.  Do not use floor polish or wax that makes floors slippery. If you must use wax, use non-skid floor wax.  Do not have throw rugs and other things on the floor that can make you trip. What can I do with my stairs?  Do not leave any items on the stairs.  Make sure that there are handrails on both sides of the stairs and use them. Fix handrails that are broken or loose. Make sure that handrails are as long as the stairways.  Check any carpeting to make sure that it is firmly attached to the stairs. Fix any carpet that is loose or worn.  Avoid having throw rugs at the top or bottom of the stairs. If you do have throw rugs, attach them to the floor with  carpet tape.  Make sure that you have a light switch at the top of the stairs and the bottom of the stairs. If you do not have them, ask someone to add them for you. What else can I do to help prevent falls?  Wear shoes that:  Do not have high heels.  Have rubber bottoms.  Are comfortable and fit you well.  Are closed at the toe. Do not wear sandals.  If you use a stepladder:  Make sure that it is fully opened. Do not climb a closed stepladder.  Make sure that both sides of the stepladder are locked into place.  Ask someone to hold it for you, if possible.  Clearly mark and make sure that you can see:  Any grab bars or handrails.  First and last steps.  Where the edge of each step is.  Use tools that help you move around (mobility aids) if they are needed. These include:  Canes.  Walkers.  Scooters.  Crutches.  Turn on the lights when you go into a dark area. Replace any light bulbs as soon as they burn out.  Set up your furniture so you have a clear path. Avoid moving your furniture around.  If any of your floors are uneven, fix them.  If there are any pets around you, be aware of where they are.  Review your medicines with your doctor. Some medicines can make you feel dizzy. This can increase your chance of falling. Ask your doctor what other things that you can do to help prevent falls. This information is not intended to replace advice given to you by your health care provider. Make sure you discuss any questions you have with your health care provider. Document Released: 06/04/2009 Document Revised: 01/14/2016 Document Reviewed: 09/12/2014 Elsevier Interactive Patient Education  2017 [employer].

## 2019-10-02 NOTE — Patient Instructions (Addendum)
Follow up in 6 months to recheck cholesterol We'll notify you of your lab results and make any changes if needed Keep up the good work!  You look great! Use a heating pad for muscle pain and ice for joint pain Follow up with [facility] for your repeat colonoscopy Call with any questions or concerns Stay Safe!  Stay Healthy!

## 2019-10-02 NOTE — Assessment & Plan Note (Signed)
Pt doing much better on daily Florinef.  No changes at this time.

## 2019-10-02 NOTE — Assessment & Plan Note (Signed)
Suspect this is age related.  Pt is able to manage symptoms fairly well w/ sparing use of Tylenol Arthritis.  Discussed supportive care w/ ice and using a heating pad for muscle pain.  Pt expressed understanding and is in agreement w/ plan.

## 2019-10-02 NOTE — Progress Notes (Signed)
Subjective:   JENIN BIRDSALL is a 69 y.o. female who presents for Medicare Annual (Subsequent) preventive examination.  Review of Systems:   Cardiac Risk Factors include: advanced age (>73mn, >>92women);dyslipidemia    Objective:     Vitals: BP 116/62   Pulse 82   Temp 98.5 F (36.9 C) (Temporal)   Ht '5\' 4"'  (1.626 m)   Wt 107 lb 3.2 oz (48.6 kg)   SpO2 99%   BMI 18.40 kg/m   Body mass index is 18.4 kg/m.  Advanced Directives 10/02/2019 10/25/2018 10/25/2018 08/01/2018 07/11/2018 07/06/2017 04/26/2017  Does Patient Have a Medical Advance Directive? Yes No No Yes No No No  Type of AParamedicof A[place]aceLiving will - - Living will - - -  Does patient want to make changes to medical advance directive? No - Patient declined - - Yes (MAU/Ambulatory/Procedural Areas - Information given) - - -  Copy of [place] Chart? Yes - validated most recent copy scanned in chart (See row information) - - - - - -  Would patient like information on creating a medical advance directive? - No - Patient declined No - Patient declined - Yes (MAU/Ambulatory/Procedural Areas - Information given) Yes (MAU/Ambulatory/Procedural Areas - Information given) No - Patient declined    Tobacco Social History   Tobacco Use  Smoking Status Never Smoker  Smokeless Tobacco Never Used     Counseling given: Not Answered   Clinical Intake:  Pre-visit preparation completed: Yes  Pain : No/denies pain  Diabetes: No  How often do you need to have someone help you when you read instructions, pamphlets, or other written materials from your doctor or pharmacy?: 1 - Never  Interpreter Needed?: No  Information entered by :: CDenman GeorgeLPN  Past Medical History:  Diagnosis Date  . Anemia   . Asthma    - normal spirometry 2009 FEV1  >90% predicted.  - methacoline challenge neg 11/09  . Bronchitis    recurrent  . Cataract   . Chronic cough    - sinus CT  03-23-10 > neg.  - allergy profile 03-23-10 > nl, IgE 14.  - flutter valve rx 03-23-10  . Fibromyalgia   . GERD (gastroesophageal reflux disease)    exacerbating VCD  . Glaucoma   . Hyperlipidemia   . Hypertension   . Hyperthyroidism    with hot nodule.  - total thyroidectomy 11-05-08 benign -> subsequent hypothyroidism.  .Marland KitchenHypokalemia   . Hypothyroidism   . Hypothyroidism    a. following thyroidectomy.  . Mild CAD    a. LHC 04/2015 - mild nonobstructive CAD with 30% dLAD, 40% D1, 25% pLCx, 30% mRCA, 30% RPDA, normal EF >65% with normal LVEDP.  . Orthostatic hypotension   . Osteoarthritis   . Paroxysmal atrial fibrillation (HCC)   . Renal insufficiency   . Vasomotor rhinitis    exacerbates VCD  . Vocal cord dysfunction    proven on FOB 9/09   Past Surgical History:  Procedure Laterality Date  . ABDOMINAL HYSTERECTOMY  1980  . ABDOMINAL HYSTERECTOMY    . BASAL CELL CARCINOMA EXCISION  11/08  . CARDIAC CATHETERIZATION N/A 05/15/2015   Procedure: Left Heart Cath and Coronary Angiography;  Surgeon: MSherren Mocha MD;  [facility];  Service: Cardiovascular;  Laterality: N/A;  . COLONOSCOPY N/A 04/27/2017   Dr NSilverio Decampfor scant rectal bleeding and iron def anemia: Melanosis coli, lipomatous IC valve, left sided  tics, non-bleeding hemorrhoid, 10 mm transverse polyp.    . ESOPHAGOGASTRODUODENOSCOPY N/A 04/27/2017   Mauri Pole, MD; [facility] ENDOSCOPY. for iron def anemia.  Monilial/candidial esophagitis. Small HH.    . EYE SURGERY    . scar tissue removal  1982  . THYROID SURGERY  2010  . THYROIDECTOMY  11-05-08   Family History  Problem Relation Age of Onset  . Asthma Mother   . Heart disease Mother   . Emphysema Father   . Heart disease Father   . Irregular heart beat Brother   . Asthma Grandchild    Social History   Socioeconomic History  . Marital status: Married    Spouse name: Not on file  . Number of children: 1  . Years of education: Not on file  .  Highest education level: Not on file  Occupational History  . Occupation: owns a Scientist, research (physical sciences)  . Occupation: OWNER    Employer: [employer]  Tobacco Use  . Smoking status: Never Smoker  . Smokeless tobacco: Never Used  Substance and Sexual Activity  . Alcohol use: Yes    Alcohol/week: 1.0 standard drinks    Types: 1 Glasses of wine per week    Comment: occasional wine or beer  . Drug use: No  . Sexual activity: Yes    Birth control/protection: None  Other Topics Concern  . Not on file  Social History Narrative  . Not on file   Social Determinants of Health   Financial Resource Strain:   . Difficulty of Paying Living Expenses: Not on file  Food Insecurity:   . Worried About Charity fundraiser in the Last Year: Not on file  . Ran Out of Food in the Last Year: Not on file  Transportation Needs:   . Lack of Transportation (Medical): Not on file  . Lack of Transportation (Non-Medical): Not on file  Physical Activity:   . Days of Exercise per Week: Not on file  . Minutes of Exercise per Session: Not on file  Stress:   . Feeling of Stress : Not on file  Social Connections:   . Frequency of Communication with Friends and Family: Not on file  . Frequency of Social Gatherings with Friends and Family: Not on file  . Attends Religious Services: Not on file  . Active Member of Clubs or Organizations: Not on file  . Attends Archivist Meetings: Not on file  . Marital Status: Not on file    Outpatient Encounter Medications as of 10/02/2019  Medication Sig  . Acetaminophen (TYLENOL ARTHRITIS PAIN PO) Take 325 mg by mouth daily as needed (FOR MILD PAIN).   Marland Kitchen ALPRAZolam (XANAX) 0.5 MG tablet Take 1 tablet (0.5 mg total) by mouth 2 (two) times daily as needed for anxiety.  Marland Kitchen amitriptyline (ELAVIL) 50 MG tablet TAKE 1 TABLET(50 MG) BY MOUTH AT BEDTIME  . atorvastatin (LIPITOR) 20 MG tablet TAKE 1 TABLET(20 MG) BY MOUTH DAILY  . digoxin (LANOXIN) 0.125 MG  tablet Take 1 tablet (125 mcg total) by mouth daily. Please schedule annual appt with Dr. Martinique in April for refills. Thank you  . fludrocortisone (FLORINEF) 0.1 MG tablet TAKE 1 TABLET(0.1 MG) BY MOUTH DAILY  . hydroxypropyl methylcellulose / hypromellose (ISOPTO TEARS / GONIOVISC) 2.5 % ophthalmic solution Place 2 drops into both eyes daily as needed for dry eyes.  Marland Kitchen LEVOTHYROXINE SODIUM PO Take 0.05 mg by mouth daily.  . metoprolol succinate (TOPROL-XL) 25 MG 24  hr tablet TAKE 1 TABLET(25 MG) BY MOUTH DAILY  . Multiple Vitamins-Minerals (MULTIVITAMIN ADULT PO) Take 1 tablet by mouth daily.  . potassium chloride SA (K-DUR,KLOR-CON) 20 MEQ tablet TAKE 1 TABLET(20 MEQ) BY MOUTH DAILY  . simethicone (MYLICON) 80 MG chewable tablet Chew 160 mg by mouth every 6 (six) hours as needed for flatulence.  . [DISCONTINUED] blood glucose meter kit and supplies Dispense based on patient and insurance preference. Check blood sugar daily prior to eating breakfast or if signs/symptoms of hypoglycemia  . [DISCONTINUED] cyclobenzaprine (FLEXERIL) 5 MG tablet Take 1 tablet (5 mg total) by mouth at bedtime.  . [DISCONTINUED] dicyclomine (BENTYL) 10 MG capsule Take 1 capsule (10 mg total) by mouth 3 (three) times daily before meals.  . [DISCONTINUED] Lancets (ONETOUCH DELICA PLUS [id number]) MISC USE TO CHECK BLOOD SUGAR DAILY PRIOR TO EATING BREAKFAST OR IF SIGNS/SYMPTOMS OF HYPOGLYCEMIA OCCUR  . [DISCONTINUED] meloxicam (MOBIC) 15 MG tablet Take 1 tablet (15 mg total) by mouth daily.  . [DISCONTINUED] ONETOUCH VERIO test strip USE TO CHECK BLOOD SUGAR DAILY PRIOR TO EATING BREAKFAST OR IF SIGNS/SYMPTOMS OF HYPOGLYCEMIA OCCUR   No facility-administered encounter medications on file as of 10/02/2019.    Activities of Daily Living In your present state of health, do you have any difficulty performing the following activities: 10/02/2019 02/26/2019  Hearing? N N  Vision? N N  Difficulty concentrating or making  decisions? N N  Comment some forgetfulness -  Walking or climbing stairs? N N  Dressing or bathing? N N  Doing errands, shopping? N N  Preparing Food and eating ? N -  Using the Toilet? N -  In the past six months, have you accidently leaked urine? N -  Do you have problems with loss of bowel control? N -  Managing your Medications? N -  Managing your Finances? N -  Housekeeping or managing your Housekeeping? N -  Some recent data might be hidden    Patient Care Team: Midge Minium, MD as PCP - General (Family Medicine) Jacelyn Pi, MD as Consulting Physician (Endocrinology) Jola Schmidt, MD as Consulting Physician (Ophthalmology) Martinique, Peter M, MD as Consulting Physician (Cardiology) Tanda Rockers, MD as Consulting Physician (Pulmonary Disease)    Assessment:   This is a routine wellness examination for [place].  Exercise Activities and Dietary recommendations Current Exercise Habits: The patient does not participate in regular exercise at present  Goals    . patient     Stay active.     . Patient Stated     Attempt to decrease work load, relax.        Fall Risk Fall Risk  10/02/2019 02/26/2019 08/01/2018 07/11/2018 05/31/2018  Falls in the past year? 1 0 1 1 Yes  Comment - - - Losing balance; going up stairs -  Number falls in past yr: 1 0 '1 1 1  ' Injury with Fall? 0 0 0 0 Yes  Risk Factor Category  - - - - -  Risk for fall due to : History of fall(s) - - Impaired balance/gait -  Follow up Education provided;Falls prevention discussed;Falls evaluation completed - - Falls prevention discussed Falls prevention discussed   Is the patient's home free of loose throw rugs in walkways, pet beds, electrical cords, etc?   yes      Grab bars in the bathroom? yes      Handrails on the stairs?   yes      Adequate lighting?   yes  Timed Get Up and Go performed: completed and within normal timeframe; no gait abnormalities noted   Depression Screen PHQ 2/9 Scores  10/02/2019 02/26/2019 08/01/2018 07/11/2018  PHQ - 2 Score 0 0 0 0  PHQ- 9 Score - 0 0 -     Cognitive Function Cognitive Testing  Alert? Yes         Normal Appearance? Yes  Oriented to person? Yes           Place? Yes  Time? Yes  Recall of three objects? Yes  Can perform simple calculations? Yes  Displays appropriate judgment? Yes  Can read the correct time from a watch face? Yes   MMSE - Mini Mental State Exam 07/11/2018 07/06/2017  Orientation to time 5 5  Orientation to Place 5 5  Registration 3 3  Attention/ Calculation 5 5  Recall 2 3  Language- name 2 objects 2 2  Language- repeat 1 1  Language- follow 3 step command 3 3  Language- read & follow direction 1 1  Write a sentence 1 1  Copy design 1 1  Total score 29 30        Immunization History  Administered Date(s) Administered  . Fluad Quad(high Dose 65+) 10/02/2019  . Influenza Whole 06/22/2009  . Influenza, High Dose Seasonal PF 07/06/2017, 08/01/2018  . Pneumococcal Conjugate-13 11/18/2015  . Pneumococcal Polysaccharide-23 08/23/2003, 07/06/2017  . Tdap 07/10/2012    Qualifies for Shingles Vaccine?Discussed and patient will check with pharmacy for coverage.  Patient education handout provided   Screening Tests Health Maintenance  Topic Date Due  . Hepatitis C Screening  [birth year]  . COLONOSCOPY  06/01/2018  . DEXA SCAN  03/16/2019  . MAMMOGRAM  09/27/2019  . TETANUS/TDAP  07/10/2022  . INFLUENZA VACCINE  Completed  . PNA vac Low Risk Adult  Completed    Cancer Screenings: Lung: Low Dose CT Chest recommended if Age 59-80 years, 30 pack-year currently smoking OR have quit w/in 15years. Patient does not qualify. Breast:  Up to date on Mammogram? Yes; scheduled for today  Up to date of Bone Density/Dexa? Yes Colorectal: last colonoscopy 06/01/17     Plan:  I have personally reviewed and addressed the Medicare Annual Wellness questionnaire and have noted the following in the patient's chart:   A. Medical and social history B. Use of alcohol, tobacco or illicit drugs  C. Current medications and supplements D. Functional ability and status E.  Nutritional status F.  Physical activity G. Advance directives H. List of other physicians I.  Hospitalizations, surgeries, and ER visits in previous 12 months J.  [place] such as hearing and vision if needed, cognitive and depression L. Referrals, records requested, and appointments- will request mammogram and dexa results from                Dr. Chalmers Cater   In addition, I have reviewed and discussed with patient certain preventive protocols, quality metrics, and best practice recommendations. A written personalized care plan for preventive services as well as general preventive health recommendations were provided to patient.   Signed,  Denman George, LPN  Nurse Health Advisor    Nurse Notes: no additional

## 2019-10-02 NOTE — Assessment & Plan Note (Signed)
Very pleased to see that pt has gained 10 lbs since last visit w/ me.  Encouraged her to continue eating regularly.  Will follow.

## 2019-10-11 ENCOUNTER — Encounter: Payer: Self-pay | Admitting: General Practice

## 2019-10-15 ENCOUNTER — Telehealth: Payer: Self-pay

## 2019-10-15 ENCOUNTER — Encounter: Payer: Self-pay | Admitting: Physician Assistant

## 2019-10-15 ENCOUNTER — Telehealth (INDEPENDENT_AMBULATORY_CARE_PROVIDER_SITE_OTHER): Payer: Medicare Other | Admitting: Physician Assistant

## 2019-10-15 VITALS — BP 116/77 | HR 75 | Ht 63.5 in | Wt 104.0 lb

## 2019-10-15 DIAGNOSIS — E785 Hyperlipidemia, unspecified: Secondary | ICD-10-CM

## 2019-10-15 DIAGNOSIS — I10 Essential (primary) hypertension: Secondary | ICD-10-CM

## 2019-10-15 DIAGNOSIS — E039 Hypothyroidism, unspecified: Secondary | ICD-10-CM | POA: Diagnosis not present

## 2019-10-15 DIAGNOSIS — I2510 Atherosclerotic heart disease of native coronary artery without angina pectoris: Secondary | ICD-10-CM

## 2019-10-15 DIAGNOSIS — I480 Paroxysmal atrial fibrillation: Secondary | ICD-10-CM | POA: Diagnosis not present

## 2019-10-15 NOTE — Telephone Encounter (Signed)
Called the patient to get her prepared for her virtual visit with Almyra Deforest, PA-C. Will try calling again.

## 2019-10-15 NOTE — Patient Instructions (Signed)
Medication Instructions:  Your physician recommends that you continue on your current medications as directed. Please refer to the Current Medication list given to you today.  *If you need a refill on your cardiac medications before your next appointment, please call your pharmacy*  Lab Work: You will need to have your Primary Care Physician to obtain labs (blood work) at your next visit for labs:  Digoxin Level  If you have labs (blood work) drawn today and your tests are completely normal, you will receive your results only by: Marland Kitchen MyChart Message (if you have MyChart) OR . A paper copy in the mail If you have any lab test that is abnormal or we need to change your treatment, we will call you to review the results.  Testing/Procedures: NONE ordered at this time of appointment   Follow-Up: At [facility], you and your health needs are our priority.  As part of our continuing mission to provide you with exceptional heart care, we have created designated Provider Care Teams.  These Care Teams include your primary Cardiologist (physician) and Advanced Practice Providers (APPs -  Physician Assistants and Nurse Practitioners) who all work together to provide you with the care you need, when you need it.  Your next appointment:   6 month(s)  The format for your next appointment:   In Person  Provider:   Peter Martinique, MD  Other Instructions

## 2019-10-15 NOTE — Progress Notes (Signed)
Virtual Visit via Telephone Note   This visit type was conducted due to national recommendations for restrictions regarding the COVID-19 Pandemic (e.g. social distancing) in an effort to limit this patient's exposure and mitigate transmission in our community.  Due to her co-morbid illnesses, this patient is at least at moderate risk for complications without adequate follow up.  This format is felt to be most appropriate for this patient at this time.  The patient did not have access to video technology/had technical difficulties with video requiring transitioning to audio format only (telephone).  All issues noted in this document were discussed and addressed.  No physical exam could be performed with this format.  Please refer to the patient's chart for her  consent to telehealth for [facility].   Date:  10/15/2019   ID:  Kaitlyn Adams, Kaitlyn Adams 1951-06-29, MRN [id number]  Patient Location: Home Provider Location: Office  PCP:  Midge Minium, MD  Cardiologist:  Peter Martinique, MD  Electrophysiologist:  None   Evaluation Performed:  Follow-Up Visit  Chief Complaint:  followup  History of Present Illness:    Kaitlyn Adams is a 69 y.o. female with past medical history of PAF, PAT, HTN, HLD, hypothyroidism after thyroidectomy, GERD, fibromyalgia and history of nonobstructive CAD.  Previous cardiac catheterization performed in September 2016 showed 30% distal LAD, 40% D1, 25% proximal left circumflex, 30% mid RCA, 30% RPDA, EF greater than 65%.  30-day event monitor showed episodes of PAT however no PAF.  She is on Xarelto for recurrent atrial fibrillation.  Echocardiogram obtained at the time showed EF 65 to 70%.  Repeat lab work on 05/18/2015 showed high digoxin level, digoxin was later reduced.  She had a GI bleed in September 2018, hemoglobin went down to 5.3.  EGD demonstrated severe Candida esophagitis, small hiatal hernia.  Colonoscopy demonstrated melanosis, lipomatous  ileocecal valve, 10 mm polyp in the transverse colon which was resected, nonbleeding internal hemorrhoid, diverticulosis of the sigmoid and descending colon.  She eventually had a capsule endoscopy as outpatient which showed 1 small linear erosion.  Due to high risk of bleeding secondary to frequent fall and financial cost burden, her Xarelto was eventually discontinued.    Patient was previously admitted in March 2020 with lightheadedness, she was orthostatic and appears to be dehydrated.  She also had hypokalemia.  Aldactone was discontinued and metoprolol reduced.  Cortisol level was low.  She was later started on Florinef.  Patient was last seen by Dr. Martinique in April 2020 at which time she was doing well.  Patient was contacted today via virtual visit.  Both her husband and her on the waiting list to get the vaccine.  Otherwise she denies any chest pain or shortness of breath.  She was seen by Dr. Birdie Riddle 2 weeks ago, at that time, she had a lot of arthritic pain.  Otherwise, she is able to take care of herself without any issue.  She is being very careful when she get up due to orthostatic hypotension.  Symptom seems to be well controlled on Florinef.  Otherwise she denies any palpitation, lower extremity edema, orthopnea or PND.  She can follow-up with Dr. Martinique in 6 months.  She is due for digoxin level, this can be done at her PCPs office during the next visit in 6 months.  Her recent lab work does show that her LDL was borderline high, her LDL goal should be less than 70 given the history of coronary artery  disease.  She opted to do a trial of diet and exercise for 6 months and repeat lab work at her PCPs office.  She is aware that if her LDL remain elevated on the next lab work, her Lipitor should be increased to 40 mg daily.  The patient does not have symptoms concerning for COVID-19 infection (fever, chills, cough, or new shortness of breath).    Past Medical History:  Diagnosis Date  . Anemia    . Asthma    - normal spirometry 2009 FEV1  >90% predicted.  - methacoline challenge neg 11/09  . Bronchitis    recurrent  . Cataract   . Chronic cough    - sinus CT 03-23-10 > neg.  - allergy profile 03-23-10 > nl, IgE 14.  - flutter valve rx 03-23-10  . Fibromyalgia   . GERD (gastroesophageal reflux disease)    exacerbating VCD  . Glaucoma   . Hyperlipidemia   . Hypertension   . Hyperthyroidism    with hot nodule.  - total thyroidectomy 11-05-08 benign -> subsequent hypothyroidism.  Marland Kitchen Hypokalemia   . Hypothyroidism   . Hypothyroidism    a. following thyroidectomy.  . Mild CAD    a. LHC 04/2015 - mild nonobstructive CAD with 30% dLAD, 40% D1, 25% pLCx, 30% mRCA, 30% RPDA, normal EF >65% with normal LVEDP.  . Orthostatic hypotension   . Osteoarthritis   . Paroxysmal atrial fibrillation (HCC)   . Renal insufficiency   . Vasomotor rhinitis    exacerbates VCD  . Vocal cord dysfunction    proven on FOB 9/09   Past Surgical History:  Procedure Laterality Date  . ABDOMINAL HYSTERECTOMY  1980  . ABDOMINAL HYSTERECTOMY    . BASAL CELL CARCINOMA EXCISION  11/08  . CARDIAC CATHETERIZATION N/A 05/15/2015   Procedure: Left Heart Cath and Coronary Angiography;  Surgeon: Sherren Mocha, MD;  Location: [place] [facility];  Service: Cardiovascular;  Laterality: N/A;  . COLONOSCOPY N/A 04/27/2017   Dr Silverio Decamp for scant rectal bleeding and iron def anemia: Melanosis coli, lipomatous IC valve, left sided tics, non-bleeding hemorrhoid, 10 mm transverse polyp.    . ESOPHAGOGASTRODUODENOSCOPY N/A 04/27/2017   Mauri Pole, MD; [facility] ENDOSCOPY. for iron def anemia.  Monilial/candidial esophagitis. Small HH.    . EYE SURGERY    . scar tissue removal  1982  . THYROID SURGERY  2010  . THYROIDECTOMY  11-05-08     Current Meds  Medication Sig  . Acetaminophen (TYLENOL ARTHRITIS PAIN PO) Take 325 mg by mouth daily as needed (FOR MILD PAIN).   Marland Kitchen ALPRAZolam (XANAX) 0.5 MG tablet Take 1 tablet (0.5 mg  total) by mouth 2 (two) times daily as needed for anxiety.  Marland Kitchen amitriptyline (ELAVIL) 50 MG tablet TAKE 1 TABLET(50 MG) BY MOUTH AT BEDTIME  . atorvastatin (LIPITOR) 20 MG tablet TAKE 1 TABLET(20 MG) BY MOUTH DAILY  . digoxin (LANOXIN) 0.125 MG tablet Take 1 tablet (125 mcg total) by mouth daily. Please schedule annual appt with Dr. Martinique in April for refills. Thank you  . fludrocortisone (FLORINEF) 0.1 MG tablet TAKE 1 TABLET(0.1 MG) BY MOUTH DAILY  . hydroxypropyl methylcellulose / hypromellose (ISOPTO TEARS / GONIOVISC) 2.5 % ophthalmic solution Place 2 drops into both eyes daily as needed for dry eyes.  Marland Kitchen LEVOTHYROXINE SODIUM PO Take 0.05 mg by mouth daily.  . metoprolol succinate (TOPROL-XL) 25 MG 24 hr tablet TAKE 1 TABLET(25 MG) BY MOUTH DAILY  . Multiple Vitamins-Minerals (MULTIVITAMIN ADULT  PO) Take 1 tablet by mouth daily.  . potassium chloride SA (K-DUR,KLOR-CON) 20 MEQ tablet TAKE 1 TABLET(20 MEQ) BY MOUTH DAILY (Patient taking differently: Take 60 mEq by mouth daily. )  . simethicone (MYLICON) 80 MG chewable tablet Chew 160 mg by mouth every 6 (six) hours as needed for flatulence.     Allergies:   Fenofibrate, Erythromycin, Guaifenesin er, and Sulfonamide derivatives   Social History   Tobacco Use  . Smoking status: Never Smoker  . Smokeless tobacco: Never Used  Substance Use Topics  . Alcohol use: Yes    Alcohol/week: 1.0 standard drinks    Types: 1 Glasses of wine per week    Comment: occasional wine or beer  . Drug use: No     Family Hx: The patient's family history includes Asthma in her grandchild and mother; Emphysema in her father; Heart disease in her father and mother; Irregular heart beat in her brother.  ROS:   Please see the history of present illness.     All other systems reviewed and are negative.   Prior CV studies:   The following studies were reviewed today:  Cath 05/15/2015 1. Mild nonobstructive CAD as outlined above 2. Normal LV systolic  function with normal LVEDP  Recommendations:  Stop ASA  Start Xarelto 20 mg daily tomorrow (CHADS-Vasc = 3 for age/HTN/female gender)  Outpatient event monitor to evaluate for PAF as cause of symptoms  Labs/Other Tests and Data Reviewed:    EKG:  An ECG dated 10/25/2018 was personally reviewed today and demonstrated:  Normal sinus rhythm with diffuse ST depression  Recent Labs: 10/25/2018: Magnesium 1.7 11/01/2018: TSH 0.33 10/02/2019: ALT 16; BUN 10; Creatinine, Ser 1.05; Hemoglobin 15.0; Platelets 276.0; Potassium 4.5; Sodium 139   Recent Lipid Panel Lab Results  Component Value Date/Time   CHOL 165 10/02/2019 10:08 AM   TRIG 157.0 (H) 10/02/2019 10:08 AM   HDL 53.20 10/02/2019 10:08 AM   CHOLHDL 3 10/02/2019 10:08 AM   LDLCALC 80 10/02/2019 10:08 AM   LDLDIRECT 93.0 09/05/2017 08:50 AM    Wt Readings from Last 3 Encounters:  10/15/19 104 lb (47.2 kg)  10/02/19 107 lb 3.2 oz (48.6 kg)  10/02/19 107 lb 3.2 oz (48.6 kg)     Objective:    Vital Signs:  BP 116/77   Pulse 75   Ht 5' 3.5" (1.613 m)   Wt 104 lb (47.2 kg)   BMI 18.13 kg/m    VITAL SIGNS:  reviewed  ASSESSMENT & PLAN:    1. PAF: No significant palpitation.  Continue digoxin and metoprolol.  She is not on any anticoagulation medication due to bleeding risk.  She is overdue for her to check digoxin level.  This can be done on the next PCP checkup.  2. Hypertension: Blood pressure stable on the current therapy.  She has a history of orthostatic hypotension and low cortisol level on Florinef.  3. Hyperlipidemia: Continue Lipitor.  4. Hypothyroidism: On Synthroid.  5. Nonobstructive CAD: Previous cardiac catheterization in 2016 showed mild disease.  She denies any recent chest pain.  Continue Lipitor  COVID-19 Education: The signs and symptoms of COVID-19 were discussed with the patient and how to seek care for testing (follow up with PCP or arrange E-visit).  The importance of social distancing was  discussed today.  Time:   Today, I have spent 13 minutes with the patient with telehealth technology discussing the above problems.     Medication Adjustments/Labs and Tests Ordered: Current  medicines are reviewed at length with the patient today.  Concerns regarding medicines are outlined above.   Tests Ordered: No orders of the defined types were placed in this encounter.   Medication Changes: No orders of the defined types were placed in this encounter.   Follow Up:  Either In Person or Virtual in 6 month(s)  Signed, Almyra Deforest, [place]  10/15/2019 10:12 AM    [place]

## 2019-10-18 ENCOUNTER — Ambulatory Visit: Payer: Medicare Other | Attending: Internal Medicine

## 2019-10-18 DIAGNOSIS — Z23 Encounter for immunization: Secondary | ICD-10-CM | POA: Insufficient documentation

## 2019-10-18 NOTE — Progress Notes (Signed)
   Covid-19 Vaccination Clinic  Name:  Kaitlyn Adams    MRN: [id number] DOB: [birth year]  10/18/2019  [name] was observed post Covid-19 immunization for 15 minutes without incidence. She was provided with Vaccine Information Sheet and instruction to access the V-Safe system.   [name] was instructed to call 911 with any severe reactions post vaccine: Marland Kitchen Difficulty breathing  . Swelling of your face and throat  . A fast heartbeat  . A bad rash all over your body  . Dizziness and weakness    Immunizations Administered    Name Date Dose VIS Date Route   Pfizer COVID-19 Vaccine 10/18/2019  4:05 PM 0.3 mL 08/02/2019 Intramuscular   Manufacturer: [place]   Lot: [id number]   [place]: [id number]

## 2019-11-13 ENCOUNTER — Ambulatory Visit: Payer: Medicare Other | Attending: Internal Medicine

## 2019-11-13 DIAGNOSIS — Z23 Encounter for immunization: Secondary | ICD-10-CM

## 2019-11-13 NOTE — Progress Notes (Signed)
   Covid-19 Vaccination Clinic  Name:  CHEREEN BARRECA    MRN: [id number] DOB: 05-20-1951  11/13/2019  Ms. Jodoin was observed post Covid-19 immunization for 15 minutes without incident. She was provided with Vaccine Information Sheet and instruction to access the V-Safe system.   Ms. Haughney was instructed to call 911 with any severe reactions post vaccine: Marland Kitchen Difficulty breathing  . Swelling of face and throat  . A fast heartbeat  . A bad rash all over body  . Dizziness and weakness   Immunizations Administered    Name Date Dose VIS Date Route   Pfizer COVID-19 Vaccine 11/13/2019  8:39 AM 0.3 mL 08/02/2019 Intramuscular   Manufacturer: [place]   Lot: [id number]   [place]: [id number]

## 2019-11-25 ENCOUNTER — Other Ambulatory Visit: Payer: Self-pay | Admitting: General Practice

## 2019-11-25 MED ORDER — METOPROLOL SUCCINATE ER 25 MG PO TB24: ORAL_TABLET | ORAL | 1 refills | Status: DC

## 2019-11-25 MED ORDER — ATORVASTATIN CALCIUM 20 MG PO TABS: ORAL_TABLET | ORAL | 1 refills | Status: DC

## 2019-11-25 MED ORDER — FLUDROCORTISONE ACETATE 0.1 MG PO TABS: ORAL_TABLET | ORAL | 1 refills | Status: DC

## 2019-11-25 MED ORDER — AMITRIPTYLINE HCL 50 MG PO TABS: ORAL_TABLET | ORAL | 0 refills | Status: DC

## 2019-12-05 ENCOUNTER — Telehealth: Payer: Self-pay | Admitting: Family Medicine

## 2019-12-05 NOTE — Progress Notes (Signed)
  Chronic Care Management   Outreach Note  12/05/2019 Name: Kaitlyn Adams MRN: [id number] DOB: [birth year]  Referred by: Midge Minium, MD Reason for referral : No chief complaint on file.   An unsuccessful telephone outreach was attempted today. The patient was referred to the pharmacist for assistance with care management and care coordination.   Follow Up Plan:   Earney Hamburg Upstream Scheduler

## 2019-12-13 ENCOUNTER — Telehealth: Payer: Self-pay | Admitting: Family Medicine

## 2019-12-13 NOTE — Progress Notes (Signed)
  Chronic Care Management   Outreach Note  12/13/2019 Name: [name] MRN: [id number] DOB: June 05, 1951  Referred by: Midge Minium, MD Reason for referral : No chief complaint on file.   An unsuccessful telephone outreach was attempted today. The patient was referred to the pharmacist for assistance with care management and care coordination.    This note is not being shared with the patient for the following reason: To respect privacy (The patient or proxy has requested that the information not be shared). Follow Up Plan:   Earney Hamburg Upstream Scheduler

## 2019-12-20 ENCOUNTER — Telehealth: Payer: Self-pay | Admitting: Family Medicine

## 2019-12-20 NOTE — Progress Notes (Signed)
  Chronic Care Management   Note  12/20/2019 Name: Kaitlyn Adams MRN: [id number] DOB: March 26, 1951  Kaitlyn Adams is a 69 y.o. year old female who is a primary care patient of Birdie Riddle, Aundra Millet, MD. I reached out to Wenda Overland by phone today in response to a referral sent by Ms. Jasmine Pang [place]'s PCP, Midge Minium, MD.   Kaitlyn Adams was given information about Chronic Care Management services today including:  1. CCM service includes personalized support from designated clinical staff supervised by her physician, including individualized plan of care and coordination with other care providers 2. 24/7 contact phone numbers for assistance for urgent and routine care needs. 3. Service will only be billed when office clinical staff spend 20 minutes or more in a month to coordinate care. 4. Only one practitioner may furnish and bill the service in a calendar month. 5. The patient may stop CCM services at any time (effective at the end of the month) by phone call to the office staff.   Patient agreed to services and verbal consent obtained.   This note is not being shared with the patient for the following reason: To respect privacy (The patient or proxy has requested that the information not be shared).  Follow up plan:   Earney Hamburg Upstream Scheduler

## 2019-12-23 ENCOUNTER — Other Ambulatory Visit: Payer: Self-pay | Admitting: Cardiology

## 2019-12-23 NOTE — Telephone Encounter (Signed)
Rx request sent to pharmacy.  

## 2020-01-28 ENCOUNTER — Other Ambulatory Visit: Payer: Self-pay | Admitting: General Practice

## 2020-01-28 DIAGNOSIS — E785 Hyperlipidemia, unspecified: Secondary | ICD-10-CM

## 2020-01-28 DIAGNOSIS — I10 Essential (primary) hypertension: Secondary | ICD-10-CM

## 2020-01-28 DIAGNOSIS — E039 Hypothyroidism, unspecified: Secondary | ICD-10-CM

## 2020-02-04 ENCOUNTER — Ambulatory Visit: Payer: Medicare Other

## 2020-02-04 ENCOUNTER — Other Ambulatory Visit: Payer: Self-pay

## 2020-02-04 DIAGNOSIS — I480 Paroxysmal atrial fibrillation: Secondary | ICD-10-CM

## 2020-02-04 DIAGNOSIS — E785 Hyperlipidemia, unspecified: Secondary | ICD-10-CM

## 2020-02-04 DIAGNOSIS — I951 Orthostatic hypotension: Secondary | ICD-10-CM

## 2020-02-04 DIAGNOSIS — I10 Essential (primary) hypertension: Secondary | ICD-10-CM

## 2020-02-04 NOTE — Patient Instructions (Addendum)
Please call me at [phone] (direct line) with any questions - thank you!  - Kaitlyn Adams., Clinical Pharmacist  Goals Addressed            This Visit's Progress   . PharmD Care Plan       CARE PLAN ENTRY  Current Barriers:  . Chronic Disease Management support, education, and care coordination needs related to: high blood pressure, high cholesterol, atrial fibrillation, orthostatic hypotension.   Hypertension/AFib . Pharmacist Clinical Goal(s): o Over the next 180 days, patient will work with PharmD and providers to maintain BP goal <140/90 and current heart rate ~75-85 BPM . Current regimen:  . Digoxin 0.125 mg tablet once daily  . Metoprolol succinate 25 mg daily . Interventions: o Continue current management . Patient self care activities - Over the next 180 days, patient will: o Check BP/HR at least once every 1-2 weeks, document, and provide at future appointments o Ensure daily salt intake < 2300 mg/day  Hyperlipidemia . Pharmacist Clinical Goal(s): o Over the next 180 days, patient will work with PharmD and providers to maintain LDL goal < 100 . Current regimen:  o Atorvastatin 20 mg daily  . Interventions: o Continue current management . Patient self care activities - Over the next 180 days, patient will: o Continue current management  Orthostatic Hypotension . Pharmacist Clinical Goal(s) o Over the next 180 days, patient will work with PharmD and providers to minimize symptoms of orthostasis . Current regimen:  o Fludrocortisone 0.1 mg daily  . Interventions: o Continue current management . Patient self care activities - Over the next 180 days, patient will: o Continue current management  Medication management . Pharmacist Clinical Goal(s): o Over the next 180 days, patient will work with PharmD and providers to maintain optimal medication adherence . Current pharmacy: [facility] . Interventions o Comprehensive medication review performed. o Continue  current medication management strategy . Patient self care activities - Over the next 180 days, patient will: o Take medications as prescribed o Report any questions or concerns to PharmD and/or provider(s)  Initial goal documentation.      Kaitlyn Adams was given information about Chronic Care Management services today including:  1. CCM service includes personalized support from designated clinical staff supervised by her physician, including individualized plan of care and coordination with other care providers 2. 24/7 contact phone numbers for assistance for urgent and routine care needs. 3. Standard insurance, coinsurance, copays and deductibles apply for chronic care management only during months in which we provide at least 20 minutes of these services. Most insurances cover these services at 100%, however patients may be responsible for any copay, coinsurance and/or deductible if applicable. This service may help you avoid the need for more expensive face-to-face services. 4. Only one practitioner may furnish and bill the service in a calendar month. 5. The patient may stop CCM services at any time (effective at the end of the month) by phone call to the office staff.  Patient agreed to services and verbal consent obtained.   The patient verbalized understanding of instructions provided today and agreed to receive a mailed copy of patient instruction and/or educational materials. Telephone follow up appointment with pharmacy team member scheduled for: See next appointment with "Care Management Staff" under "What's Next" below.   Thank you!  Kaitlyn Adams, Pharm.D., BCGP Clinical Pharmacist [facility] [phone]  Hypertension, Adult High blood pressure (hypertension) is when the force of blood pumping through the arteries  is too strong. The arteries are the blood vessels that carry blood from the heart throughout the body. Hypertension forces the  heart to work harder to pump blood and may cause arteries to become narrow or stiff. Untreated or uncontrolled hypertension can cause a heart attack, heart failure, a stroke, kidney disease, and other problems. A blood pressure reading consists of a higher number over a lower number. Ideally, your blood pressure should be below 120/80. The first ("top") number is called the systolic pressure. It is a measure of the pressure in your arteries as your heart beats. The second ("bottom") number is called the diastolic pressure. It is a measure of the pressure in your arteries as the heart relaxes. What are the causes? The exact cause of this condition is not known. There are some conditions that result in or are related to high blood pressure. What increases the risk? Some risk factors for high blood pressure are under your control. The following factors may make you more likely to develop this condition:  Smoking.  Having type 2 diabetes mellitus, high cholesterol, or both.  Not getting enough exercise or physical activity.  Being overweight.  Having too much fat, sugar, calories, or salt (sodium) in your diet.  Drinking too much alcohol. Some risk factors for high blood pressure may be difficult or impossible to change. Some of these factors include:  Having chronic kidney disease.  Having a family history of high blood pressure.  Age. Risk increases with age.  Race. You may be at higher risk if you are African American.  Gender. Men are at higher risk than women before age 58. After age 81, women are at higher risk than men.  Having obstructive sleep apnea.  Stress. What are the signs or symptoms? High blood pressure may not cause symptoms. Very high blood pressure (hypertensive crisis) may cause:  Headache.  Anxiety.  Shortness of breath.  Nosebleed.  Nausea and vomiting.  Vision changes.  Severe chest pain.  Seizures. How is this diagnosed? This condition is  diagnosed by measuring your blood pressure while you are seated, with your arm resting on a flat surface, your legs uncrossed, and your feet flat on the floor. The cuff of the blood pressure monitor will be placed directly against the skin of your upper arm at the level of your heart. It should be measured at least twice using the same arm. Certain conditions can cause a difference in blood pressure between your right and left arms. Certain factors can cause blood pressure readings to be lower or higher than normal for a short period of time:  When your blood pressure is higher when you are in a health care provider's office than when you are at home, this is called white coat hypertension. Most people with this condition do not need medicines.  When your blood pressure is higher at home than when you are in a health care provider's office, this is called masked hypertension. Most people with this condition may need medicines to control blood pressure. If you have a high blood pressure reading during one visit or you have normal blood pressure with other risk factors, you may be asked to:  Return on a different day to have your blood pressure checked again.  Monitor your blood pressure at home for 1 week or longer. If you are diagnosed with hypertension, you may have other blood or imaging tests to help your health care provider understand your overall risk for other conditions.  How is this treated? This condition is treated by making healthy lifestyle changes, such as eating healthy foods, exercising more, and reducing your alcohol intake. Your health care provider may prescribe medicine if lifestyle changes are not enough to get your blood pressure under control, and if:  Your systolic blood pressure is above 130.  Your diastolic blood pressure is above 80. Your personal target blood pressure may vary depending on your medical conditions, your age, and other factors. Follow these instructions at  home: Eating and drinking   Eat a diet that is high in fiber and potassium, and low in sodium, added sugar, and fat. An example eating plan is called the DASH (Dietary Approaches to Stop Hypertension) diet. To eat this way: ? Eat plenty of fresh fruits and vegetables. Try to fill one half of your plate at each meal with fruits and vegetables. ? Eat whole grains, such as whole-wheat pasta, brown rice, or whole-grain bread. Fill about one fourth of your plate with whole grains. ? Eat or drink low-fat dairy products, such as skim milk or low-fat yogurt. ? Avoid fatty cuts of meat, processed or cured meats, and poultry with skin. Fill about one fourth of your plate with lean proteins, such as fish, chicken without skin, beans, eggs, or tofu. ? Avoid pre-made and processed foods. These tend to be higher in sodium, added sugar, and fat.  Reduce your daily sodium intake. Most people with hypertension should eat less than 1,500 mg of sodium a day.  Do not drink alcohol if: ? Your health care provider tells you not to drink. ? You are pregnant, may be pregnant, or are planning to become pregnant.  If you drink alcohol: ? Limit how much you use to:  0-1 drink a day for women.  0-2 drinks a day for men. ? Be aware of how much alcohol is in your drink. In the [place] one drink equals one 12 oz bottle of beer (355 mL), one 5 oz glass of wine (148 mL), or one 1 oz glass of hard liquor (44 mL). Lifestyle   Work with your health care provider to maintain a healthy body weight or to lose weight. Ask what an ideal weight is for you.  Get at least 30 minutes of exercise most days of the week. Activities may include walking, swimming, or biking.  Include exercise to strengthen your muscles (resistance exercise), such as Pilates or lifting weights, as part of your weekly exercise routine. Try to do these types of exercises for 30 minutes at least 3 days a week.  Do not use any products that contain  nicotine or tobacco, such as cigarettes, e-cigarettes, and chewing tobacco. If you need help quitting, ask your health care provider.  Monitor your blood pressure at home as told by your health care provider.  Keep all follow-up visits as told by your health care provider. This is important. Medicines  Take over-the-counter and prescription medicines only as told by your health care provider. Follow directions carefully. Blood pressure medicines must be taken as prescribed.  Do not skip doses of blood pressure medicine. Doing this puts you at risk for problems and can make the medicine less effective.  Ask your health care provider about side effects or reactions to medicines that you should watch for. Contact a health care provider if you:  Think you are having a reaction to a medicine you are taking.  Have headaches that keep coming back (recurring).  Feel dizzy.  Have  swelling in your ankles.  Have trouble with your vision. Get help right away if you:  Develop a severe headache or confusion.  Have unusual weakness or numbness.  Feel faint.  Have severe pain in your chest or abdomen.  Vomit repeatedly.  Have trouble breathing. Summary  Hypertension is when the force of blood pumping through your arteries is too strong. If this condition is not controlled, it may put you at risk for serious complications.  Your personal target blood pressure may vary depending on your medical conditions, your age, and other factors. For most people, a normal blood pressure is less than 120/80.  Hypertension is treated with lifestyle changes, medicines, or a combination of both. Lifestyle changes include losing weight, eating a healthy, low-sodium diet, exercising more, and limiting alcohol. This information is not intended to replace advice given to you by your health care provider. Make sure you discuss any questions you have with your health care provider. Document Revised: 04/18/2018  Document Reviewed: 04/18/2018 Elsevier Patient Education  2020 [employer].

## 2020-02-04 NOTE — Progress Notes (Signed)
Chronic Care Management Pharmacy  Name: Kaitlyn Adams  MRN: [id number] DOB: 07-Jul-1951  Chief Complaint/ HPI  Kaitlyn Adams,  69 y.o. , female presents for their Initial CCM visit with the clinical pharmacist via telephone due to COVID-19 Pandemic. Reports to be doing great overall, is still working part time as Engineer, maintenance.   PCP : Midge Minium, MD  Chronic conditions include:  Encounter Diagnoses  Name Primary?  . Paroxysmal atrial fibrillation ([place])   . Orthostatic hypotension   . Hyperlipidemia, unspecified hyperlipidemia type   . Essential hypertension    Patient Active Problem List   Diagnosis Date Noted  . Hypokalemia 10/25/2018  . Moderate protein-calorie malnutrition ([place]) 08/01/2018  . Upper airway cough syndrome 05/22/2017  . Symptomatic anemia 04/26/2017  . Chronic constipation 07/07/2016  . History of colonic polyps 07/07/2016  . Chronic anticoagulation 07/07/2016  . Iron deficiency anemia 06/15/2016  . Anxiety and depression 05/18/2016  . Physical exam 11/18/2015  . Pain in joint, ankle and foot 11/16/2015  . PAT (paroxysmal atrial tachycardia) ([place]) 09/08/2015  . Hyperlipidemia 07/30/2015  . CAD (coronary artery disease)   . Chest pain at rest, not due to CAD, possible due to a fib. 05/15/2015  . Chronic bronchitis ([place]) 06/17/2011  . Orthostatic hypotension 12/06/2010  . Paroxysmal atrial fibrillation ([facility])   . Renal insufficiency   . Myalgia   . Hypothyroidism   . Glaucoma   . GERD, SEVERE 08/19/2010  . Cough 03/31/2010  . Osteoarthritis 04/17/2008  . FIBROMYALGIA 04/17/2008   Past Surgical History:  Procedure Laterality Date  . ABDOMINAL HYSTERECTOMY  1980  . ABDOMINAL HYSTERECTOMY    . BASAL CELL CARCINOMA EXCISION  11/08  . CARDIAC CATHETERIZATION N/A 05/15/2015   Procedure: Left Heart Cath and Coronary Angiography;  Surgeon: Sherren Mocha, MD;  Location: [facility];  Service: Cardiovascular;  Laterality: N/A;  .  COLONOSCOPY N/A 04/27/2017   Dr Silverio Decamp for scant rectal bleeding and iron def anemia: Melanosis coli, lipomatous IC valve, left sided tics, non-bleeding hemorrhoid, 10 mm transverse polyp.    . ESOPHAGOGASTRODUODENOSCOPY N/A 04/27/2017   Mauri Pole, MD; [facility] ENDOSCOPY. for iron def anemia.  Monilial/candidial esophagitis. Small HH.    . EYE SURGERY    . scar tissue removal  1982  . THYROID SURGERY  2010  . THYROIDECTOMY  11-05-08   Social History   Socioeconomic History  . Marital status: Married    Spouse name: Not on file  . Number of children: 1  . Years of education: Not on file  . Highest education level: Not on file  Occupational History  . Occupation: owns a Scientist, research (physical sciences)  . Occupation: OWNER    Employer: [employer]  Tobacco Use  . Smoking status: Never Smoker  . Smokeless tobacco: Never Used  Vaping Use  . Vaping Use: Never used  Substance and Sexual Activity  . Alcohol use: Yes    Alcohol/week: 1.0 standard drink    Types: 1 Glasses of wine per week    Comment: occasional wine or beer  . Drug use: No  . Sexual activity: Yes    Birth control/protection: None  Other Topics Concern  . Not on file  Social History Narrative  . Not on file   Social Determinants of Health   Financial Resource Strain:   . Difficulty of Paying Living Expenses:   Food Insecurity:   . Worried About Charity fundraiser in the Last  Year:   . Ran Out of Food in the Last Year:   Transportation Needs:   . Film/video editor (Medical):   Marland Kitchen Lack of Transportation (Non-Medical):   Physical Activity: Insufficiently Active  . Days of Exercise per Week: 3 days  . Minutes of Exercise per Session: 30 min  Stress:   . Feeling of Stress :   Social Connections:   . Frequency of Communication with Friends and Family:   . Frequency of Social Gatherings with Friends and Family:   . Attends Religious Services:   . Active Member of Clubs or Organizations:   . Attends English as a second language teacher Meetings:   Marland Kitchen Marital Status:    Family History  Problem Relation Age of Onset  . Asthma Mother   . Heart disease Mother   . Emphysema Father   . Heart disease Father   . Irregular heart beat Brother   . Asthma Grandchild    Allergies  Allergen Reactions  . Fenofibrate Other (See Comments)    GI side effects   . Erythromycin Nausea And Vomiting    REACTION: nausea and vomiting  . Guaifenesin Er Palpitations  . Sulfonamide Derivatives Nausea And Vomiting    REACTION: nausea and vomiting   Outpatient Encounter Medications as of 02/04/2020  Medication Sig Note  . Acetaminophen (TYLENOL ARTHRITIS PAIN PO) Take 325 mg by mouth daily as needed (FOR MILD PAIN).    Marland Kitchen ALPRAZolam (XANAX) 0.5 MG tablet Take 1 tablet (0.5 mg total) by mouth 2 (two) times daily as needed for anxiety.   Marland Kitchen amitriptyline (ELAVIL) 50 MG tablet TAKE 1 TABLET(50 MG) BY MOUTH AT BEDTIME   . atorvastatin (LIPITOR) 20 MG tablet TAKE 1 TABLET(20 MG) BY MOUTH DAILY   . digoxin (LANOXIN) 0.125 MG tablet TAKE 1 TABLET BY MOUTH DAILY   . fludrocortisone (FLORINEF) 0.1 MG tablet TAKE 1 TABLET(0.1 MG) BY MOUTH DAILY   . FUROSEMIDE PO as needed.   . hydroxypropyl methylcellulose / hypromellose (ISOPTO TEARS / GONIOVISC) 2.5 % ophthalmic solution Place 2 drops into both eyes daily as needed for dry eyes.   Marland Kitchen LEVOTHYROXINE SODIUM PO Take 0.05 mg by mouth daily.   . metoprolol succinate (TOPROL-XL) 25 MG 24 hr tablet TAKE 1 TABLET(25 MG) BY MOUTH DAILY   . Multiple Vitamins-Minerals (MULTIVITAMIN ADULT PO) Take 1 tablet by mouth daily.   . potassium chloride SA (K-DUR,KLOR-CON) 20 MEQ tablet TAKE 1 TABLET(20 MEQ) BY MOUTH DAILY (Patient taking differently: Take 60 mEq by mouth daily. ) 10/02/2019: 3 tablets daily   . simethicone (MYLICON) 80 MG chewable tablet Chew 160 mg by mouth every 6 (six) hours as needed for flatulence.    No facility-administered encounter medications on file as of 02/04/2020.   Patient  Care Team    Relationship Specialty Notifications Start End  Midge Minium, MD PCP - General Family Medicine  07/30/15   Martinique, Peter M, MD PCP - Cardiology Cardiology Admissions 10/14/19   Jacelyn Pi, MD Consulting Physician Endocrinology  07/30/15   Jola Schmidt, MD Consulting Physician Ophthalmology  07/30/15   Martinique, Peter M, MD Consulting Physician Cardiology  07/30/15   Tanda Rockers, MD Consulting Physician Pulmonary Disease  07/06/17   Madelin Rear, [facility] Pharmacist Pharmacist  12/20/19    Comment: PHONE NUMBER [phone]   Current Diagnosis/Assessment: Goals Addressed            This Visit's Progress   . PharmD Care Plan  CARE PLAN ENTRY  Current Barriers:  . Chronic Disease Management support, education, and care coordination needs related to: high blood pressure, high cholesterol, atrial fibrillation, orthostatic hypotension.   Hypertension/AFib . Pharmacist Clinical Goal(s): o Over the next 180 days, patient will work with PharmD and providers to maintain BP goal <140/90 and current heart rate ~75-85 BPM . Current regimen:  . Digoxin 0.125 mg tablet once daily  . Metoprolol succinate 25 mg daily . Interventions: o Continue current management . Patient self care activities - Over the next 180 days, patient will: o Check BP/HR at least once every 1-2 weeks, document, and provide at future appointments o Ensure daily salt intake < 2300 mg/day  Hyperlipidemia . Pharmacist Clinical Goal(s): o Over the next 180 days, patient will work with PharmD and providers to maintain LDL goal < 100 . Current regimen:  o Atorvastatin 20 mg daily  . Interventions: o Continue current management . Patient self care activities - Over the next 180 days, patient will: o Continue current management  Orthostatic Hypotension . Pharmacist Clinical Goal(s) o Over the next 180 days, patient will work with PharmD and providers to minimize symptoms of orthostasis . Current  regimen:  o Fludrocortisone 0.1 mg daily  . Interventions: o Continue current management . Patient self care activities - Over the next 180 days, patient will: o Continue current management  Medication management . Pharmacist Clinical Goal(s): o Over the next 180 days, patient will work with PharmD and providers to maintain optimal medication adherence . Current pharmacy: [facility] . Interventions o Comprehensive medication review performed. o Continue current medication management strategy . Patient self care activities - Over the next 180 days, patient will: o Take medications as prescribed o Report any questions or concerns to PharmD and/or provider(s)  Initial goal documentation.      Hypertension   BP Readings from Last 3 Encounters:  10/15/19 116/77  10/02/19 116/62  10/02/19 116/62   Pulse Readings from Last 3 Encounters:  10/15/19 75  10/02/19 82  10/02/19 82   Denies dizziness, SOB, chest pain. BP has been well controlled a 6 month follow-ups with PCP and cardiologist. Note meds in afib a/p.   Plan  Continue current medications    AFIB   Patient is currently rate controlled. HR ~75-82 BPM at recent visits. Followed by Dr. Martinique.  Patient has failed these meds in past: Patient is currently controlled on the following medications:  . Digoxin 0.125 mg tablet once daily  . Metoprolol succinate 25 mg daily  Plan  Continue current medications  Hypotension   States less frequent dizziness after starting fludrocortisone last year. Might have an episode of dizziness every couple of weeks now. Denies any side effects. Patient is currently controlled on the following medications:  . Fludrocortisone 0.1 mg once daily  Plan  Continue current medications.  Hypokalemia   Lab Results  Component Value Date   K 4.5 10/02/2019   K 3.9 11/01/2018   K 3.9 10/26/2018   Potassium at goal 09/2019. Patient is currently controlled on the following medications:   . Klor-con 20 meq tablet - three tablets (60 meq) once daily  Plan  Continue current medications.  Hyperlipidemia      Component Value Date/Time   CHOL 165 10/02/2019 1008   TRIG 157.0 (H) 10/02/2019 1008   HDL 53.20 10/02/2019 1008   LDLCALC 80 10/02/2019 1008   LDLDIRECT 93.0 09/05/2017 0850   The 10-year ASCVD risk score Mikey Bussing DC Brooke Bonito., et  al., 2013) is: 9.9%   Values used to calculate the score:     Age: 5 years     Sex: Female     Is Non-Hispanic African American: No     Diabetic: No     Tobacco smoker: No     Systolic Blood Pressure: 865 mmHg     Is BP treated: Yes     HDL Cholesterol: 53.2 mg/dL     Total Cholesterol: 165 mg/dL   Denies any muscle or abdominal pain or n/v. We discussed proper use and side effect file of atorvastatin. Patient is currently controlled on the following medications:   Atorvastatin 20 mg daily   Plan  Continue current medications.    Fibromyalgia    Reports taking for ~10 years. Denies any side effects at this time. Patient is currently controlled on the following medications:  . Amitriptyline 50 mg tablet daily   Plan  Continue current medications.  Anxiety   Reports that she does not take routinely. Feels like medication is still benefiting when she does need it. Denies any side effects. Patient is currently controlled on the following medications:  . Alprazolam 0.5 mg tablet two times daily as needed for anxiety   Plan  Continue current medications.   Hypothyroidism   Lab Results  Component Value Date/Time   TSH 0.33 (L) 11/01/2018 12:09 PM   TSH 0.45 08/01/2018 10:14 AM   TSH 0.45 09/05/2017 08:50 AM   TSH near goal. Physical with PCP 03/2020. Patient is currently controlled on the following medications:  . Levothyroxine 50 mcg daily  Plan  Continue current medications.  GERD   Patient denies dysphagia, heartburn or nausea. Expresses understanding of trigger avoidance. Might need to take a tums on occasion  but denies any routine symptoms. Currently controlled on: Marland Kitchen Diet alone  Plan   Continue control with diet.  Vaccines   Reviewed and discussed patient's vaccination history.   Immunization History  Administered Date(s) Administered  . Fluad Quad(high Dose 65+) 10/02/2019  . Influenza Whole 06/22/2009  . Influenza, High Dose Seasonal PF 07/06/2017, 08/01/2018  . PFIZER SARS-COV-2 Vaccination 10/18/2019, 11/13/2019  . Pneumococcal Conjugate-13 11/18/2015  . Pneumococcal Polysaccharide-23 08/23/2003, 07/06/2017  . Tdap 07/10/2012   Plan  Recommended patient receive Shingrix vaccine in pharmacy.   Medication Management   Receives prescription medications from:  [facility] [address] [place] [address] Phone: [phone] Fax: [phone]  Denies any issues with current medication management.   Plan  Continue current medication management strategy.  Follow up: 6 month phone visit. ______________ Visit Information SDOH (Social Determinants of Health) assessments performed: Yes.  Ms. Bielicki was given information about Chronic Care Management services today including:  1. CCM service includes personalized support from designated clinical staff supervised by her physician, including individualized plan of care and coordination with other care providers 2. 24/7 contact phone numbers for assistance for urgent and routine care needs. 3. Standard insurance, coinsurance, copays and deductibles apply for chronic care management only during months in which we provide at least 20 minutes of these services. Most insurances cover these services at 100%, however patients may be responsible for any copay, coinsurance and/or deductible if applicable. This service may help you avoid the need for more expensive face-to-face services. 4. Only one practitioner may furnish and bill the  service in a calendar month. 5. The patient may stop CCM  services at any time (effective at the end of the month) by phone call to the office staff.  Patient agreed to services and verbal consent obtained.   Madelin Rear, Pharm.D., BCGP Clinical Pharmacist [facility] at [facility] [phone]

## 2020-02-21 ENCOUNTER — Other Ambulatory Visit: Payer: Self-pay | Admitting: General Practice

## 2020-02-21 MED ORDER — AMITRIPTYLINE HCL 50 MG PO TABS: ORAL_TABLET | ORAL | 0 refills | Status: DC

## 2020-03-22 ENCOUNTER — Other Ambulatory Visit: Payer: Self-pay | Admitting: Cardiology

## 2020-03-27 ENCOUNTER — Telehealth: Payer: Self-pay | Admitting: Cardiology

## 2020-03-27 NOTE — Telephone Encounter (Signed)
LVM for patient to return call to get follow up scheduled with Jordan from recall list 

## 2020-04-01 ENCOUNTER — Ambulatory Visit: Payer: Medicare Other | Admitting: Family Medicine

## 2020-04-07 ENCOUNTER — Other Ambulatory Visit: Payer: Self-pay

## 2020-04-07 ENCOUNTER — Encounter: Payer: Self-pay | Admitting: Family Medicine

## 2020-04-07 ENCOUNTER — Ambulatory Visit (INDEPENDENT_AMBULATORY_CARE_PROVIDER_SITE_OTHER): Payer: Medicare Other | Admitting: Family Medicine

## 2020-04-07 VITALS — BP 111/79 | HR 76 | Temp 97.7°F | Resp 16 | Ht 64.0 in | Wt 110.5 lb

## 2020-04-07 DIAGNOSIS — E440 Moderate protein-calorie malnutrition: Secondary | ICD-10-CM

## 2020-04-07 DIAGNOSIS — I2510 Atherosclerotic heart disease of native coronary artery without angina pectoris: Secondary | ICD-10-CM

## 2020-04-07 DIAGNOSIS — E785 Hyperlipidemia, unspecified: Secondary | ICD-10-CM | POA: Diagnosis not present

## 2020-04-07 DIAGNOSIS — I480 Paroxysmal atrial fibrillation: Secondary | ICD-10-CM | POA: Diagnosis not present

## 2020-04-07 LAB — HEPATIC FUNCTION PANEL
ALT: 33 U/L (ref 0–35)
AST: 29 U/L (ref 0–37)
Albumin: 4.5 g/dL (ref 3.5–5.2)
Alkaline Phosphatase: 80 U/L (ref 39–117)
Bilirubin, Direct: 0.1 mg/dL (ref 0.0–0.3)
Total Bilirubin: 0.5 mg/dL (ref 0.2–1.2)
Total Protein: 7 g/dL (ref 6.0–8.3)

## 2020-04-07 LAB — LIPID PANEL
Cholesterol: 180 mg/dL (ref 0–200)
HDL: 47.1 mg/dL (ref >39.00)
LDL Cholesterol: 111 mg/dL — ABNORMAL HIGH (ref 0–99)
NonHDL: 133.26
Total CHOL/HDL Ratio: 4
Triglycerides: 110 mg/dL (ref 0.0–149.0)
VLDL: 22 mg/dL (ref 0.0–40.0)

## 2020-04-07 LAB — BASIC METABOLIC PANEL
BUN: 12 mg/dL (ref 6–23)
CO2: 25 mEq/L (ref 19–32)
Calcium: 9.7 mg/dL (ref 8.4–10.5)
Chloride: 108 mEq/L (ref 96–112)
Creatinine, Ser: 1.07 mg/dL (ref 0.40–1.20)
GFR: 50.61 mL/min — ABNORMAL LOW (ref >60.00)
Glucose, Bld: 96 mg/dL (ref 70–99)
Potassium: 4 mEq/L (ref 3.5–5.1)
Sodium: 140 mEq/L (ref 135–145)

## 2020-04-07 LAB — CBC WITH DIFFERENTIAL/PLATELET
Basophils Absolute: 0.1 10*3/uL (ref 0.0–0.1)
Basophils Relative: 0.7 % (ref 0.0–3.0)
Eosinophils Absolute: 0.1 10*3/uL (ref 0.0–0.7)
Eosinophils Relative: 1.7 % (ref 0.0–5.0)
HCT: 45.4 % (ref 36.0–46.0)
Hemoglobin: 15.1 g/dL — ABNORMAL HIGH (ref 12.0–15.0)
Lymphocytes Relative: 18.7 % (ref 12.0–46.0)
Lymphs Abs: 1.4 10*3/uL (ref 0.7–4.0)
MCHC: 33.2 g/dL (ref 30.0–36.0)
MCV: 86.4 fl (ref 78.0–100.0)
Monocytes Absolute: 0.4 10*3/uL (ref 0.1–1.0)
Monocytes Relative: 5.1 % (ref 3.0–12.0)
Neutro Abs: 5.5 10*3/uL (ref 1.4–7.7)
Neutrophils Relative %: 73.8 % (ref 43.0–77.0)
Platelets: 277 10*3/uL (ref 150.0–400.0)
RBC: 5.25 Mil/uL — ABNORMAL HIGH (ref 3.87–5.11)
RDW: 13.3 % (ref 11.5–15.5)
WBC: 7.5 10*3/uL (ref 4.0–10.5)

## 2020-04-07 LAB — TSH: TSH: 3.56 u[IU]/mL (ref 0.35–4.50)

## 2020-04-07 MED ORDER — FUROSEMIDE 20 MG PO TABS
20.0000 mg | ORAL_TABLET | Freq: Every day | ORAL | 1 refills | Status: DC
Start: 2020-04-07 — End: 2020-11-06

## 2020-04-07 NOTE — Patient Instructions (Addendum)
Follow up in 6 months to recheck cholesterol We'll notify you of your lab results and make any changes if needed Take the Furosemide as needed for swelling Keep up the good work!  You look great! Call with any questions or concerns Stay Safe!  Stay Healthy!!

## 2020-04-07 NOTE — Assessment & Plan Note (Signed)
Chronic problem.  Cards is requesting a Dig level- ordered.  Currently asymptomatic w/ exception of intermittent swelling.  She has been taking husband's Lasix as needed.  Will provide her w/ her own prescription and check BMP.  Pt expressed understanding and is in agreement w/ plan.

## 2020-04-07 NOTE — Progress Notes (Signed)
   Subjective:    Patient ID: Kaitlyn Adams, female    DOB: [birth year], 69 y.o.   MRN: [id number]  HPI Hyperlipidemia- chronic problem, on Lipitor 20mg .  No CP, SOB, abd pain, N/V.  Protein Calorie Malnutrition- pt has gained 6 lbs since last visit.  Pt reports eating regularly.  Paroxysmal A Fib- chronic problem, on Metoprolol XL 25mg .  HR 76.  Following w/ Cards.  On Dig.  Occasional palpitations.  Some issues with swelling- has been taking husband's Furosemide.  Would like a prescription of her own.  Review of Systems For ROS see HPI   This visit occurred during the SARS-CoV-2 public health emergency.  Safety protocols were in place, including screening questions prior to the visit, additional usage of staff PPE, and extensive cleaning of exam room while observing appropriate contact time as indicated for disinfecting solutions.       Objective:   Physical Exam Vitals reviewed.  Constitutional:      General: She is not in acute distress.    Appearance: Normal appearance. She is well-developed.  HENT:     Head: Normocephalic and atraumatic.  Eyes:     Conjunctiva/sclera: Conjunctivae normal.     Pupils: Pupils are equal, round, and reactive to light.  Neck:     Thyroid: No thyromegaly.  Cardiovascular:     Rate and Rhythm: Normal rate and regular rhythm.     Heart sounds: Normal heart sounds. No murmur heard.   Pulmonary:     Effort: Pulmonary effort is normal. No respiratory distress.     Breath sounds: Normal breath sounds.  Abdominal:     General: There is no distension.     Palpations: Abdomen is soft.     Tenderness: There is no abdominal tenderness.  Musculoskeletal:     Cervical back: Normal range of motion and neck supple.  Lymphadenopathy:     Cervical: No cervical adenopathy.  Skin:    General: Skin is warm and dry.  Neurological:     Mental Status: She is alert and oriented to person, place, and time.  Psychiatric:        Behavior: Behavior normal.             Assessment & Plan:

## 2020-04-07 NOTE — Assessment & Plan Note (Signed)
Chronic problem.  Tolerating statin w/o difficulty.  Check labs.  Adjust meds prn  

## 2020-04-07 NOTE — Assessment & Plan Note (Signed)
Improving.  Pt has gained 6 lbs since last visit.  Applauded her efforts and eating regularly.  Will follow.

## 2020-04-08 ENCOUNTER — Encounter: Payer: Self-pay | Admitting: General Practice

## 2020-04-08 LAB — DIGOXIN LEVEL: Digoxin Level: 1 mcg/L (ref 0.8–2.0)

## 2020-05-07 DIAGNOSIS — S01501A Unspecified open wound of lip, initial encounter: Secondary | ICD-10-CM | POA: Diagnosis not present

## 2020-05-07 DIAGNOSIS — D3709 Neoplasm of uncertain behavior of other specified sites of the oral cavity: Secondary | ICD-10-CM | POA: Diagnosis not present

## 2020-05-11 ENCOUNTER — Telehealth: Payer: Self-pay | Admitting: General Practice

## 2020-05-11 NOTE — Telephone Encounter (Signed)
The number of falls is concerning.  She has multiple medications and medical conditions that could be contributing.  Based on this, she needs to schedule an in-office appt but she should not drive herself

## 2020-05-11 NOTE — Telephone Encounter (Signed)
Spoke with Dr. Glenford Peers who advised patient had been referred to him from her dentist after a fall. He fixed a laceration of the mouth.   However, he was concerned as patient has fallen 2 times in the past few days. Today complaining of light-headedness. Vitals were stable. However, he is concerned with the number of falls that she is having, he advised patient to reached out to our office but was concerned that she would not. Wanted PCP to be aware.

## 2020-05-11 NOTE — Telephone Encounter (Signed)
Called pt and LMOVM to return call.  °

## 2020-05-11 NOTE — Telephone Encounter (Signed)
Patient called back. She states she had a fall about 3 weeks ago which she states was minor. She also confirms 2 additional falls, one on Wednesday night and one Thursday evening. Patient states she has occasional lightheadedness but not with every fall. Patient has noticed some soreness in her ribcage, states it is on the left side. She can not recall hitting the area. Patient scheduled for Wednesday at 12:30 and reports her husband will bring her.

## 2020-05-13 ENCOUNTER — Encounter: Payer: Self-pay | Admitting: Family Medicine

## 2020-05-13 ENCOUNTER — Other Ambulatory Visit: Payer: Self-pay

## 2020-05-13 ENCOUNTER — Ambulatory Visit (INDEPENDENT_AMBULATORY_CARE_PROVIDER_SITE_OTHER): Payer: Medicare Other | Admitting: Family Medicine

## 2020-05-13 VITALS — BP 116/78 | HR 76 | Temp 98.0°F | Resp 16 | Ht 64.0 in | Wt 106.1 lb

## 2020-05-13 DIAGNOSIS — R55 Syncope and collapse: Secondary | ICD-10-CM | POA: Diagnosis not present

## 2020-05-13 DIAGNOSIS — E039 Hypothyroidism, unspecified: Secondary | ICD-10-CM | POA: Diagnosis not present

## 2020-05-13 DIAGNOSIS — S0993XA Unspecified injury of face, initial encounter: Secondary | ICD-10-CM | POA: Diagnosis not present

## 2020-05-13 DIAGNOSIS — I2510 Atherosclerotic heart disease of native coronary artery without angina pectoris: Secondary | ICD-10-CM

## 2020-05-13 LAB — BASIC METABOLIC PANEL
BUN: 12 mg/dL (ref 6–23)
CO2: 26 mEq/L (ref 19–32)
Calcium: 9.7 mg/dL (ref 8.4–10.5)
Chloride: 104 mEq/L (ref 96–112)
Creatinine, Ser: 1.18 mg/dL (ref 0.40–1.20)
GFR: 45.2 mL/min — ABNORMAL LOW (ref >60.00)
Glucose, Bld: 96 mg/dL (ref 70–99)
Potassium: 3.8 mEq/L (ref 3.5–5.1)
Sodium: 138 mEq/L (ref 135–145)

## 2020-05-13 LAB — CBC WITH DIFFERENTIAL/PLATELET
Basophils Absolute: 0.1 10*3/uL (ref 0.0–0.1)
Basophils Relative: 0.8 % (ref 0.0–3.0)
Eosinophils Absolute: 0.1 10*3/uL (ref 0.0–0.7)
Eosinophils Relative: 1.2 % (ref 0.0–5.0)
HCT: 43.7 % (ref 36.0–46.0)
Hemoglobin: 14.8 g/dL (ref 12.0–15.0)
Lymphocytes Relative: 18.6 % (ref 12.0–46.0)
Lymphs Abs: 1.7 10*3/uL (ref 0.7–4.0)
MCHC: 33.9 g/dL (ref 30.0–36.0)
MCV: 85.5 fl (ref 78.0–100.0)
Monocytes Absolute: 0.6 10*3/uL (ref 0.1–1.0)
Monocytes Relative: 6.7 % (ref 3.0–12.0)
Neutro Abs: 6.5 10*3/uL (ref 1.4–7.7)
Neutrophils Relative %: 72.7 % (ref 43.0–77.0)
Platelets: 314 10*3/uL (ref 150.0–400.0)
RBC: 5.11 Mil/uL (ref 3.87–5.11)
RDW: 13 % (ref 11.5–15.5)
WBC: 9 10*3/uL (ref 4.0–10.5)

## 2020-05-13 LAB — HEPATIC FUNCTION PANEL
ALT: 15 U/L (ref 0–35)
AST: 22 U/L (ref 0–37)
Albumin: 4.4 g/dL (ref 3.5–5.2)
Alkaline Phosphatase: 77 U/L (ref 39–117)
Bilirubin, Direct: 0.1 mg/dL (ref 0.0–0.3)
Total Bilirubin: 0.6 mg/dL (ref 0.2–1.2)
Total Protein: 6.9 g/dL (ref 6.0–8.3)

## 2020-05-13 LAB — TSH: TSH: 3.59 u[IU]/mL (ref 0.35–4.50)

## 2020-05-13 NOTE — Patient Instructions (Signed)
We are going to determine the next steps based on your lab results Please make sure you are drinking plenty of fluids and change positions slowly! Thankfully nothing seems out of place on your L side and this should heal w/ time We are going to alert Dr Martinique to this situation and see if he wants to see you sooner (my recommendation) Call with any questions or concerns Hang in there!

## 2020-05-13 NOTE — Progress Notes (Signed)
   Subjective:    Patient ID: Kaitlyn Adams, female    DOB: 1951/03/05, 69 y.o.   MRN: [id number]  HPI Falls- she tripped over a table leg 1 week ago and hit her face and elbow.  Went to dentist Thursday morning and they sent her to oral surgeon.  Then on the way home, stopped at the grocery store.  And without warning, woke up face down in the aisle.  Did not trip or slip.  No dizziness or prodrome.  Has multiple broken teeth and facial injuries.  Also reports L side pain- painful to touch and worse w/ coughing.  Pt states home cuff indicate irregular heart beat.  Pt reports prior to falling she felt steady on her feet.  Denies dizziness.   Review of Systems For ROS see HPI   This visit occurred during the SARS-CoV-2 public health emergency.  Safety protocols were in place, including screening questions prior to the visit, additional usage of staff PPE, and extensive cleaning of exam room while observing appropriate contact time as indicated for disinfecting solutions.       Objective:   Physical Exam Vitals reviewed.  Constitutional:      General: She is not in acute distress.    Comments: Very thin, almost cachectic  HENT:     Head: Normocephalic.     Mouth/Throat:     Comments: Multiple broken teeth and oral injuries Eyes:     Extraocular Movements: Extraocular movements intact.     Conjunctiva/sclera: Conjunctivae normal.     Pupils: Pupils are equal, round, and reactive to light.  Neck:     Comments: No thyromegaly Cardiovascular:     Rate and Rhythm: Normal rate and regular rhythm.     Pulses: Normal pulses.  Pulmonary:     Effort: Pulmonary effort is normal.     Breath sounds: Normal breath sounds. No wheezing or rhonchi.  Chest:     Chest wall: Tenderness (TTP over L lowest rib in mid-axillary line w/o obvious step off or deformity) present.  Musculoskeletal:     Cervical back: Neck supple.  Skin:    General: Skin is warm and dry.     Findings: Bruising (R knee)  present.  Neurological:     General: No focal deficit present.     Mental Status: She is alert and oriented to person, place, and time.           Assessment & Plan:  Syncope/Facial injury- new.  Pt had cause for her 1st fall when she got tripped up on a table leg.  However the second fall that occurred on the next day (6 days ago) was unprovoked and much more concerning.  She was walking in the grocery store and the next thing she knew she was on the ground.  No dizziness, no prodrome.  She did not slip or trip.  Unclear if she had an arrhythmia that caused her syncopal event but she does have paroxysmal Afib and other cardiac issues.  EKG today shows normal sinus rhythm but reports a new RBBB.  When compared to previous EKGs it shows low voltage in V2/V3 and notable changes in V1.  Will send note to Cards as I feel she needs re-evaluated.  Labs obtained to assess for anemia (hx of this), thyroid abnormality (hx of this), electrolyte disturbance.  If cardiac w/u is normal, will need neuro referral.  Thankfully facial injuries were addressed by oral surgeon and she has appropriate f/u.

## 2020-05-14 ENCOUNTER — Telehealth: Payer: Self-pay

## 2020-05-14 ENCOUNTER — Telehealth: Payer: Self-pay | Admitting: *Deleted

## 2020-05-14 DIAGNOSIS — I480 Paroxysmal atrial fibrillation: Secondary | ICD-10-CM

## 2020-05-14 DIAGNOSIS — R55 Syncope and collapse: Secondary | ICD-10-CM

## 2020-05-14 NOTE — Telephone Encounter (Signed)
Patient informed she has been enrolled for [employer] to ship a 30 day cardiac event monitor to her home.  Instructions  Message sent to My Chart.

## 2020-05-14 NOTE — Telephone Encounter (Signed)
Spoke to patient Dr.Jordan received a message from PCP Dr.Tabori about your recent syncopal episode.He advised 30 day heart monitor and echo before appointment already scheduled with him 06/10/20 at 11:00 am.Schedulers will call back to schedule.

## 2020-05-20 ENCOUNTER — Ambulatory Visit (INDEPENDENT_AMBULATORY_CARE_PROVIDER_SITE_OTHER): Payer: Medicare Other

## 2020-05-20 DIAGNOSIS — I480 Paroxysmal atrial fibrillation: Secondary | ICD-10-CM | POA: Diagnosis not present

## 2020-05-20 DIAGNOSIS — R55 Syncope and collapse: Secondary | ICD-10-CM | POA: Diagnosis not present

## 2020-05-21 ENCOUNTER — Other Ambulatory Visit: Payer: Self-pay | Admitting: Family Medicine

## 2020-05-26 ENCOUNTER — Ambulatory Visit (HOSPITAL_COMMUNITY): Payer: Medicare Other | Attending: Cardiology

## 2020-05-26 ENCOUNTER — Other Ambulatory Visit: Payer: Self-pay

## 2020-05-26 DIAGNOSIS — R55 Syncope and collapse: Secondary | ICD-10-CM | POA: Insufficient documentation

## 2020-05-26 DIAGNOSIS — I480 Paroxysmal atrial fibrillation: Secondary | ICD-10-CM

## 2020-05-26 LAB — ECHOCARDIOGRAM COMPLETE
Area-P 1/2: 3.1 cm2
S' Lateral: 1.85 cm

## 2020-05-28 DIAGNOSIS — Z23 Encounter for immunization: Secondary | ICD-10-CM | POA: Diagnosis not present

## 2020-06-10 ENCOUNTER — Other Ambulatory Visit: Payer: Self-pay | Admitting: Cardiology

## 2020-06-10 ENCOUNTER — Ambulatory Visit: Payer: Medicare Other | Admitting: Cardiology

## 2020-06-16 ENCOUNTER — Telehealth: Payer: Self-pay | Admitting: Cardiology

## 2020-06-16 NOTE — Telephone Encounter (Signed)
   [facility] Pre-operative Risk Assessment     Request for surgical clearance:  1. What type of surgery is being performed? Lip scar revision    2. When is this surgery scheduled? 06/18/2020   3. What type of clearance is required (medical clearance vs. Pharmacy clearance to hold med vs. Both)? Medical   4. Are there any medications that need to be held prior to surgery and how long? N/A  5. Practice name and name of physician performing surgery? Frederik Schmidt DDS, MD with [facility]   6. What is the office phone number? [phone]   7.   What is the office fax number? [phone]  8.   Anesthesia type (None, local, MAC, general) ? Light IV sedation    Sheral Apley M 06/16/2020, 8:04 AM  _________________________________________________________________   (provider comments below)

## 2020-06-16 NOTE — Telephone Encounter (Signed)
Pre-op covering staff, can we please let requesting office know that she is going to need to be seen in the office before procedure so procedure may need to be postponed if we cannot get her in before 06/18/2020.  Thank you!

## 2020-06-16 NOTE — Telephone Encounter (Signed)
Attempted to call patient, phone rang and then line cut off.

## 2020-06-16 NOTE — Telephone Encounter (Signed)
   Primary Cardiologist: Peter Martinique, MD  Chart reviewed as part of pre-operative protocol coverage. Patient has not been seen in the last 6 months. She also recently had unprovoked syncopal episode and was evaluated by PCP who recommended she be evaluated by Cardiology. She has had a Echo but Event Monitor is pending. She has not been physically evaluated by [place] yet. Procedure is low risk. However, given recent syncope with ongoing work-up, I think patient needs a follow-up visit in order to better assess preoperative cardiovascular risk. She has a follow-up visit with Dr. Martinique scheduled for 07/06/2020. She either needs a sooner appointment or needs to postpone procedure.  Pre-op covering staff: - Please schedule appointment and call patient to inform them. If patient already had an upcoming appointment within acceptable timeframe, please add "pre-op clearance" to the appointment notes so provider is aware. - Please contact requesting surgeon's office via preferred method (i.e, phone, fax) to inform them of need for appointment prior to surgery.   Darreld Mclean, PA-C  06/16/2020, 8:20 AM

## 2020-06-16 NOTE — Telephone Encounter (Signed)
Kaitlyn Adams from [facility] requesting a follow up on the surgical clearance. She states if possible they would like the clearance by tomorrow morning.

## 2020-06-16 NOTE — Telephone Encounter (Signed)
Patient returning call. She states to call her home number: [phone]

## 2020-06-17 ENCOUNTER — Telehealth: Payer: Self-pay

## 2020-06-17 NOTE — Telephone Encounter (Signed)
Spoke with Beth from Dr. Raynelle Dick office. Advised Ms. Strcikland will have to have procedure postponed until Scheduled appointment with Dr. Martinique on November 15 th. Per Eustaquio Maize stated would contact patient to advise procedure postponted.

## 2020-06-17 NOTE — Telephone Encounter (Signed)
Our office spoke with Dr. Raynelle Dick office today. Procedure will be postponed until after visit with Dr. Martinique on 07/06/2020. Will route clearance form to Dr. Martinique so he can address pre-op risk assessment at visit.  Will remove from pre-op pool.  Thank you!

## 2020-06-30 ENCOUNTER — Telehealth: Payer: Self-pay

## 2020-06-30 NOTE — Telephone Encounter (Signed)
Entered in error

## 2020-07-04 NOTE — Progress Notes (Signed)
Cardiology Office Note Date:  07/06/2020  Patient ID:  Kaitlyn, Adams [birth year], MRN [id number] PCP:  Midge Minium, MD  Cardiologist:  Dr. Martinique   Chief Complaint: PAT  History of Present Illness: Kaitlyn Adams is a 69 y.o. female with history of paroxysmal atrial fibrillation, PAT, HTN, HLD, hyperthyroidism s/p thyroidectomy with subsequent hypothyroidism, asthma, GERD, fibromyalgia,  mild nonobstructive CAD who presents for follow-up.  She was admitted 9/22-9/23/16 with complaints of chest discomfort as well as symptoms of recurrent atrial fibrillation including dyspnea, weakness/fatigue, and palpitations with near-syncope. Labs were notable for significant hypokalemia of 2.5 which was repleted.  Troponins were negative. She underwent cath showing mild nonobstructive CAD with 30% dLAD, 40% D1, 25% pLCx, 30% mRCA, 30% RPDA, normal EF >65% with normal LVEDP.  Lasix was stopped due to her hypokalemia. Labs otherwise notable for LDL 140. She had a 30-day event monitor which showed some episodes of PAT. No  AFib. She was started on Xarelto for [facility] of 3-4 (HTN, age, female, mild vascular disease). She was also started on atorvastatin. 2D echo 05/15/15: EF 20-25%, normal diastolic function. F/u labs 9/26 showed digoxin level 1.1 (digoxin decreased to 0.125mg  daily) and level came down to 0.6.   Patient was previously admitted in March 2020 with lightheadedness, she was orthostatic and appears to be dehydrated.  She also had hypokalemia.  Aldactone was discontinued and metoprolol reduced.  Cortisol level was low.  She was later started on Florinef.   She was seen in September by Dr Birdie Riddle with a syncopal episode. She had fallen over a table the day before and knocked out 6 teeth and lacerated her lip. The next day she states she hadn't eaten or slept and she had a syncopal episode without warning. Event monitor and Echo were done. Echo was essentially normal with mild MR.  Event monitor showed infrequent benign arrhythmia.     Past Medical History:  Diagnosis Date  . Anemia   . Asthma    - normal spirometry 2009 FEV1  >90% predicted.  - methacoline challenge neg 11/09  . Bronchitis    recurrent  . Cataract   . Chronic cough    - sinus CT 03-23-10 > neg.  - allergy profile 03-23-10 > nl, IgE 14.  - flutter valve rx 03-23-10  . Fibromyalgia   . GERD (gastroesophageal reflux disease)    exacerbating VCD  . Glaucoma   . Hyperlipidemia   . Hypertension   . Hyperthyroidism    with hot nodule.  - total thyroidectomy 11-05-08 benign -> subsequent hypothyroidism.  Marland Kitchen Hypokalemia   . Hypothyroidism   . Hypothyroidism    a. following thyroidectomy.  . Mild CAD    a. LHC 04/2015 - mild nonobstructive CAD with 30% dLAD, 40% D1, 25% pLCx, 30% mRCA, 30% RPDA, normal EF >65% with normal LVEDP.  . Orthostatic hypotension   . Osteoarthritis   . Paroxysmal atrial fibrillation (HCC)   . Renal insufficiency   . Vasomotor rhinitis    exacerbates VCD  . Vocal cord dysfunction    proven on FOB 9/09    Past Surgical History:  Procedure Laterality Date  . ABDOMINAL HYSTERECTOMY  1980  . ABDOMINAL HYSTERECTOMY    . BASAL CELL CARCINOMA EXCISION  11/08  . CARDIAC CATHETERIZATION N/A 05/15/2015   Procedure: Left Heart Cath and Coronary Angiography;  Surgeon: Sherren Mocha, MD;  Location: [facility];  Service: Cardiovascular;  Laterality: N/A;  . COLONOSCOPY N/A  04/27/2017   Dr Silverio Decamp for scant rectal bleeding and iron def anemia: Melanosis coli, lipomatous IC valve, left sided tics, non-bleeding hemorrhoid, 10 mm transverse polyp.    . ESOPHAGOGASTRODUODENOSCOPY N/A 04/27/2017   Mauri Pole, MD; [facility] ENDOSCOPY. for iron def anemia.  Monilial/candidial esophagitis. Small HH.    . EYE SURGERY    . scar tissue removal  1982  . THYROID SURGERY  2010  . THYROIDECTOMY  11-05-08    Current Outpatient Medications  Medication Sig Dispense Refill  . Acetaminophen  (TYLENOL ARTHRITIS PAIN PO) Take 325 mg by mouth daily as needed (FOR MILD PAIN).     Marland Kitchen ALPRAZolam (XANAX) 0.5 MG tablet Take 1 tablet (0.5 mg total) by mouth 2 (two) times daily as needed for anxiety. 60 tablet 3  . amitriptyline (ELAVIL) 50 MG tablet TAKE 1 TABLET(50 MG) BY MOUTH AT BEDTIME 90 tablet 0  . atorvastatin (LIPITOR) 20 MG tablet TAKE 1 TABLET(20 MG) BY MOUTH DAILY 90 tablet 1  . digoxin (LANOXIN) 0.125 MG tablet TAKE 1 TABLET BY MOUTH DAILY 90 tablet 0  . fludrocortisone (FLORINEF) 0.1 MG tablet TAKE 1 TABLET(0.1 MG) BY MOUTH DAILY 90 tablet 1  . furosemide (LASIX) 20 MG tablet Take 1 tablet (20 mg total) by mouth daily. 90 tablet 1  . hydroxypropyl methylcellulose / hypromellose (ISOPTO TEARS / GONIOVISC) 2.5 % ophthalmic solution Place 2 drops into both eyes daily as needed for dry eyes.    Marland Kitchen levothyroxine (SYNTHROID) 50 MCG tablet Take 50 mcg by mouth every morning.    . metoprolol succinate (TOPROL-XL) 25 MG 24 hr tablet TAKE 1 TABLET(25 MG) BY MOUTH DAILY 90 tablet 1  . Multiple Vitamins-Minerals (MULTIVITAMIN ADULT PO) Take 1 tablet by mouth daily.    . potassium chloride SA (K-DUR,KLOR-CON) 20 MEQ tablet TAKE 1 TABLET(20 MEQ) BY MOUTH DAILY (Patient taking differently: Take 60 mEq by mouth daily. ) 30 tablet 0  . simethicone (MYLICON) 80 MG chewable tablet Chew 160 mg by mouth every 6 (six) hours as needed for flatulence.     No current facility-administered medications for this visit.    Allergies:   Fenofibrate, Erythromycin, Guaifenesin er, and Sulfonamide derivatives   Social History:  The patient  reports that she has never smoked. She has never used smokeless tobacco. She reports current alcohol use of about 1.0 standard drink of alcohol per week. She reports that she does not use drugs.   Family History:  The patient's family history includes Asthma in her grandchild and mother; Emphysema in her father; Heart disease in her father and mother; Irregular heart beat in  her brother.  ROS:  Please see the history of present illness.  All other systems are reviewed and otherwise negative.   PHYSICAL EXAM:  VS:  BP 115/60   Pulse 70   Temp (!) 97.5 F (36.4 C)   Ht 5\' 3"  (1.6 m)   Wt 109 lb 6.4 oz (49.6 kg)   SpO2 99%   BMI 19.38 kg/m  BMI: Body mass index is 19.38 kg/m. GENERAL:  Well appearing, thin WF in NAD HEENT:  PERRL, EOMI, sclera are clear. Oropharynx is clear. NECK:  No jugular venous distention, carotid upstroke brisk and symmetric, no bruits, no thyromegaly or adenopathy LUNGS:  Clear to auscultation bilaterally CHEST:  Unremarkable HEART:  RRR,  PMI not displaced or sustained,S1 and S2 within normal limits, no S3, no S4: no clicks, no rubs, no murmurs ABD:  Soft, nontender. BS +, no  masses or bruits. No hepatomegaly, no splenomegaly EXT:  2 + pulses throughout, no edema, no cyanosis no clubbing SKIN:  Warm and dry.  No rashes NEURO:  Alert and oriented x 3. Cranial nerves II through XII intact. PSYCH:  Cognitively intact  EKG: Is not done today.     Recent Labs: 05/13/2020: ALT 15; BUN 12; Creatinine, Ser 1.18; Hemoglobin 14.8; Platelets 314.0; Potassium 3.8; Sodium 138; TSH 3.59  04/07/2020: Cholesterol 180; HDL 47.10; LDL Cholesterol 111; Total CHOL/HDL Ratio 4; Triglycerides 110.0; VLDL 22.0   CrCl cannot be calculated (Patient's most recent lab result is older than the maximum 21 days allowed.).   Wt Readings from Last 3 Encounters:  07/06/20 109 lb 6.4 oz (49.6 kg)  05/13/20 106 lb 2 oz (48.1 kg)  04/07/20 110 lb 8 oz (50.1 kg)     Other studies reviewed: Echo 05/26/20: IMPRESSIONS    1. Left ventricular ejection fraction, by estimation, is 65 to 70%. The  left ventricle has normal function. The left ventricle has no regional  wall motion abnormalities. Left ventricular diastolic parameters are  consistent with Grade I diastolic  dysfunction (impaired relaxation).  2. Right ventricular systolic function is normal.  The right ventricular  size is normal. There is normal pulmonary artery systolic pressure. The  estimated right ventricular systolic pressure is 72.9 mmHg.  3. The mitral valve is normal in structure. Mild mitral valve  regurgitation. No evidence of mitral stenosis.  4. The aortic valve is normal in structure. Aortic valve regurgitation is  not visualized. No aortic stenosis is present.  5. The inferior vena cava is normal in size with greater than 50%  respiratory variability, suggesting right atrial pressure of 3 mmHg.   Event monitor 06/22/20: Study Highlights   Normal sinus rhythm  Rare PACs. Single 11 beat run of PAT on 06/02/20 at 4:17 am  Symptoms do not correlate with arrhythmia.     ASSESSMENT AND PLAN:  1. PAT with history of Pafib in 2016. Mostly PAT though. Not on anticoagulation given she has only had one documented episode of AFib and has a history of major GI bleed and falls.  Will continue dig and metoprolol. Dig level 1.0. Pulse is regular today.  Avoid stressors when possible. Recent event monitor is benign. 2. Fatigue- chronic.  3. Syncope related to post trauma and not eating. Cardiac evaluation benign 4. Hyperlipidemia -  on atorvastatin, unable to tolerate fenofibrate due to GI effects.  5. Mild CAD - continue risk reduction.  6. Pre op clearance. She is cleared for oral surgery from a cardiac standpoint.   Disposition: F/u with me 12 months  Current medicines are reviewed at length with the patient today.  The patient did not have any concerns regarding medicines.   Signed, Loie Jahr Martinique MD, [facility]   07/06/2020 3:17 PM     [facility] [address], [place], [place] [address] Phone: [phone]

## 2020-07-06 ENCOUNTER — Telehealth: Payer: Self-pay

## 2020-07-06 ENCOUNTER — Ambulatory Visit (INDEPENDENT_AMBULATORY_CARE_PROVIDER_SITE_OTHER): Payer: Medicare Other | Admitting: Cardiology

## 2020-07-06 ENCOUNTER — Encounter: Payer: Self-pay | Admitting: Cardiology

## 2020-07-06 ENCOUNTER — Other Ambulatory Visit: Payer: Self-pay

## 2020-07-06 VITALS — BP 115/60 | HR 70 | Temp 97.5°F | Ht 63.0 in | Wt 109.4 lb

## 2020-07-06 DIAGNOSIS — R55 Syncope and collapse: Secondary | ICD-10-CM

## 2020-07-06 DIAGNOSIS — I471 Supraventricular tachycardia: Secondary | ICD-10-CM | POA: Diagnosis not present

## 2020-07-06 DIAGNOSIS — I10 Essential (primary) hypertension: Secondary | ICD-10-CM | POA: Diagnosis not present

## 2020-07-06 DIAGNOSIS — I2510 Atherosclerotic heart disease of native coronary artery without angina pectoris: Secondary | ICD-10-CM | POA: Diagnosis not present

## 2020-07-06 NOTE — Telephone Encounter (Signed)
Dr.Jordan cleared patient for upcoming dental surgery.Dr.Jordan's 11/15 office note faxed to [facility] at fax # [phone].

## 2020-08-01 ENCOUNTER — Other Ambulatory Visit: Payer: Self-pay | Admitting: Family Medicine

## 2020-08-04 ENCOUNTER — Telehealth: Payer: Medicare Other

## 2020-08-05 ENCOUNTER — Telehealth: Payer: Self-pay

## 2020-08-05 NOTE — Telephone Encounter (Signed)
   [facility] Pre-operative Risk Assessment    Request for surgical clearance:  1. What type of surgery is being performed? LIP REVISION SURGERY  2. When is this surgery scheduled? 08-20-2020    3. What type of clearance is required (medical clearance vs. Pharmacy clearance to hold med vs. Both)? MEDICAL  4. Are there any medications that need to be held prior to surgery and how long? NONE LISTED   5. Practice name and name of physician performing surgery? [facility]   6. What is the office phone number? [phone]   7.   What is the office fax number? [phone]  8.   Anesthesia type (None, local, MAC, general) ? IV SEDATION

## 2020-08-05 NOTE — Telephone Encounter (Signed)
   Primary Cardiologist: Peter Martinique, MD  Chart reviewed as part of pre-operative protocol coverage.   Patient was seen by Dr. Martinique 07/06/2020 and cleared for upcoming oral surgery.  Notes have already been faxed.  Office note from Dr. Martinique will be faxed again to Dr. Celine Ahr office [phone]  Note will be removed from preop APP pool.   Richardson Dopp, PA-C 08/05/2020, 11:01 AM

## 2020-08-20 ENCOUNTER — Telehealth: Payer: Medicare Other

## 2020-08-20 ENCOUNTER — Other Ambulatory Visit: Payer: Self-pay

## 2020-08-20 DIAGNOSIS — S01521A Laceration with foreign body of lip, initial encounter: Secondary | ICD-10-CM | POA: Diagnosis not present

## 2020-08-28 ENCOUNTER — Ambulatory Visit: Payer: Medicare Other

## 2020-08-28 ENCOUNTER — Other Ambulatory Visit: Payer: Self-pay

## 2020-08-28 DIAGNOSIS — K5909 Other constipation: Secondary | ICD-10-CM

## 2020-08-28 DIAGNOSIS — I471 Supraventricular tachycardia: Secondary | ICD-10-CM

## 2020-08-28 DIAGNOSIS — E785 Hyperlipidemia, unspecified: Secondary | ICD-10-CM

## 2020-08-28 MED ORDER — AMITRIPTYLINE HCL 50 MG PO TABS: ORAL_TABLET | ORAL | 0 refills | Status: DC

## 2020-08-28 NOTE — Progress Notes (Signed)
Chronic Care Management Pharmacy  Name: Kaitlyn Adams  MRN: [id number] DOB: 14-Jul-1951  Chief Complaint/ HPI  Kaitlyn Adams,  70 y.o. , female presents for their Follow-Up CCM visit with the clinical pharmacist via telephone due to COVID-19 Pandemic.   PCP : Midge Minium, MD  Chronic conditions include:  Encounter Diagnoses  Name Primary?  . Hyperlipidemia, unspecified hyperlipidemia type Yes  . PAT (paroxysmal atrial tachycardia) [facility])    Patient Active Problem List   Diagnosis Date Noted  . Hypokalemia 10/25/2018  . Moderate protein-calorie malnutrition ([place]) 08/01/2018  . Upper airway cough syndrome 05/22/2017  . Symptomatic anemia 04/26/2017  . Chronic constipation 07/07/2016  . History of colonic polyps 07/07/2016  . Chronic anticoagulation 07/07/2016  . Iron deficiency anemia 06/15/2016  . Anxiety and depression 05/18/2016  . Physical exam 11/18/2015  . Pain in joint, ankle and foot 11/16/2015  . PAT (paroxysmal atrial tachycardia) ([place]) 09/08/2015  . Hyperlipidemia 07/30/2015  . CAD (coronary artery disease)   . Chest pain at rest, not due to CAD, possible due to a fib. 05/15/2015  . Chronic bronchitis ([place]) 06/17/2011  . Orthostatic hypotension 12/06/2010  . Paroxysmal atrial fibrillation ([facility])   . Renal insufficiency   . Myalgia   . Hypothyroidism   . Glaucoma   . GERD, SEVERE 08/19/2010  . Osteoarthritis 04/17/2008  . FIBROMYALGIA 04/17/2008   Past Surgical History:  Procedure Laterality Date  . ABDOMINAL HYSTERECTOMY  1980  . ABDOMINAL HYSTERECTOMY    . BASAL CELL CARCINOMA EXCISION  11/08  . CARDIAC CATHETERIZATION N/A 05/15/2015   Procedure: Left Heart Cath and Coronary Angiography;  Surgeon: Sherren Mocha, MD;  Location: [place] CV LAB;  Service: Cardiovascular;  Laterality: N/A;  . COLONOSCOPY N/A 04/27/2017   Dr Silverio Decamp for scant rectal bleeding and iron def anemia: Melanosis coli, lipomatous IC valve, left sided tics,  non-bleeding hemorrhoid, 10 mm transverse polyp.    . ESOPHAGOGASTRODUODENOSCOPY N/A 04/27/2017   Mauri Pole, MD; [facility] ENDOSCOPY. for iron def anemia.  Monilial/candidial esophagitis. Small HH.    . EYE SURGERY    . scar tissue removal  1982  . THYROID SURGERY  2010  . THYROIDECTOMY  11-05-08   Social History   Socioeconomic History  . Marital status: Married    Spouse name: Not on file  . Number of children: 1  . Years of education: Not on file  . Highest education level: Not on file  Occupational History  . Occupation: owns a Scientist, research (physical sciences)  . Occupation: OWNER    Employer: [employer]  Tobacco Use  . Smoking status: Never Smoker  . Smokeless tobacco: Never Used  Vaping Use  . Vaping Use: Never used  Substance and Sexual Activity  . Alcohol use: Yes    Alcohol/week: 1.0 standard drink    Types: 1 Glasses of wine per week    Comment: occasional wine or beer  . Drug use: No  . Sexual activity: Yes    Birth control/protection: None  Other Topics Concern  . Not on file  Social History Narrative  . Not on file   Social Determinants of Health   Financial Resource Strain: Not on file  Food Insecurity: No Food Insecurity  . Worried About Charity fundraiser in the Last Year: Never true  . Ran Out of Food in the Last Year: Never true  Transportation Needs: Not on file  Physical Activity: Insufficiently Active  . Days of Exercise  per Week: 3 days  . Minutes of Exercise per Session: 30 min  Stress: Not on file  Social Connections: Not on file   Family History  Problem Relation Age of Onset  . Asthma Mother   . Heart disease Mother   . Emphysema Father   . Heart disease Father   . Irregular heart beat Brother   . Asthma Grandchild    Allergies  Allergen Reactions  . Fenofibrate Other (See Comments)    GI side effects   . Erythromycin Nausea And Vomiting    REACTION: nausea and vomiting  . Guaifenesin Er Palpitations  . Sulfonamide  Derivatives Nausea And Vomiting    REACTION: nausea and vomiting   Outpatient Encounter Medications as of 08/28/2020  Medication Sig Note  . Acetaminophen (TYLENOL ARTHRITIS PAIN PO) Take 325 mg by mouth daily as needed (FOR MILD PAIN).    Marland Kitchen ALPRAZolam (XANAX) 0.5 MG tablet Take 1 tablet (0.5 mg total) by mouth 2 (two) times daily as needed for anxiety.   Marland Kitchen amitriptyline (ELAVIL) 50 MG tablet TAKE 1 TABLET(50 MG) BY MOUTH AT BEDTIME   . atorvastatin (LIPITOR) 20 MG tablet TAKE 1 TABLET(20 MG) BY MOUTH DAILY   . digoxin (LANOXIN) 0.125 MG tablet TAKE 1 TABLET BY MOUTH DAILY   . fludrocortisone (FLORINEF) 0.1 MG tablet TAKE 1 TABLET(0.1 MG) BY MOUTH DAILY   . furosemide (LASIX) 20 MG tablet Take 1 tablet (20 mg total) by mouth daily.   . hydroxypropyl methylcellulose / hypromellose (ISOPTO TEARS / GONIOVISC) 2.5 % ophthalmic solution Place 2 drops into both eyes daily as needed for dry eyes.   Marland Kitchen levothyroxine (SYNTHROID) 50 MCG tablet Take 50 mcg by mouth every morning.   . metoprolol succinate (TOPROL-XL) 25 MG 24 hr tablet TAKE 1 TABLET(25 MG) BY MOUTH DAILY   . Multiple Vitamins-Minerals (MULTIVITAMIN ADULT PO) Take 1 tablet by mouth daily.   . potassium chloride SA (K-DUR,KLOR-CON) 20 MEQ tablet TAKE 1 TABLET(20 MEQ) BY MOUTH DAILY (Patient taking differently: Take 60 mEq by mouth daily. ) 10/02/2019: 3 tablets daily   . simethicone (MYLICON) 80 MG chewable tablet Chew 160 mg by mouth every 6 (six) hours as needed for flatulence.    No facility-administered encounter medications on file as of 08/28/2020.   Patient Care Team    Relationship Specialty Notifications Start End  Midge Minium, MD PCP - General Family Medicine  07/30/15   Martinique, Peter M, MD PCP - Cardiology Cardiology Admissions 10/14/19   Jacelyn Pi, MD Consulting Physician Endocrinology  07/30/15   Jola Schmidt, MD Consulting Physician Ophthalmology  07/30/15   Martinique, Peter M, MD Consulting Physician Cardiology   07/30/15   Tanda Rockers, MD Consulting Physician Pulmonary Disease  07/06/17   Madelin Rear, [facility] Pharmacist Pharmacist  12/20/19    Comment: PHONE NUMBER [phone]   Current Diagnosis/Assessment: Goals Addressed            This Visit's Progress   . PharmD Care Plan   On track    CARE PLAN ENTRY  Current Barriers:  . Chronic Disease Management support, education, and care coordination needs related to: high blood pressure, high cholesterol, atrial fibrillation, orthostatic hypotension.  AFib . Pharmacist Clinical Goal(s): o Over the next 180 days, patient will work with PharmD and providers to maintain BP goal <140/90 and current heart rate ~&)s . Current regimen:  . Digoxin 0.125 mg tablet once daily  . Metoprolol succinate 25 mg daily . Interventions: o Continue  current management . Patient self care activities - Over the next 180 days, patient will: o Check BP/HR at least once every 1-2 weeks, document, and provide at future appointments  Hyperlipidemia . Pharmacist Clinical Goal(s): o Over the next 180 days, patient will work with PharmD and providers to maintain LDL goal < 100 . Current regimen:  o Atorvastatin 20 mg daily  . Interventions: o Continue current management . Patient self care activities - Over the next 180 days, patient will: o Continue current management  Orthostatic Hypotension . Pharmacist Clinical Goal(s) o Over the next 180 days, patient will work with PharmD and providers to minimize symptoms of orthostasis . Current regimen:  o Fludrocortisone 0.1 mg daily  . Interventions: o Continue current management . Patient self care activities - Over the next 180 days, patient will: o Continue current management  Medication management . Pharmacist Clinical Goal(s): o Over the next 180 days, patient will work with PharmD and providers to maintain optimal medication adherence . Current pharmacy: [facility] . Interventions o Comprehensive  medication review performed. o Continue current medication management strategy . Patient self care activities - Over the next 180 days, patient will: o Take medications as prescribed o Report any questions or concerns to PharmD and/or provider(s)  Initial goal documentation.      BP management   BP Readings from Last 3 Encounters:  07/06/20 115/60  05/13/20 116/78  04/07/20 111/79   Pulse Readings from Last 3 Encounters:  07/06/20 70  05/13/20 76  04/07/20 76   BMP Latest Ref Rng & Units 05/13/2020 04/07/2020 10/02/2019  Glucose 70 - 99 mg/dL 96 96 78  BUN 6 - 23 mg/dL 12 12 10   Creatinine 0.40 - 1.20 mg/dL 1.18 1.07 1.05  Sodium 135 - 145 mEq/L 138 140 139  Potassium 3.5 - 5.1 mEq/L 3.8 4.0 4.5  Chloride 96 - 112 mEq/L 104 108 107  CO2 19 - 32 mEq/L 26 25 27   Calcium 8.4 - 10.5 mg/dL 9.7 9.7 9.7   On furosemide and potassium supplement. Denies dizziness - see afib. Not doing anything formal for diet/exercise, stays active around the house. Reports to stay well hydrated throughout day. On fludrocortisone for orthostatic hypotension.  Denies recent falls after previous syncopal episodes late 2021.   Patient is currently controlled on the following medications:  . See afib  Reviewed diet/exercise - Maintain a healthy weight and exercise regularly, as directed by your health care provider. Eat healthy foods, such as: Lean proteins, complex carbohydrates, fresh fruits and vegetables, low-fat dairy products, healthy fats.  Plan  Continue current medications    AFIB   Patient is currently rate controlled. HR 70s at recent visits. Followed by Dr. Martinique. History of GI bleed, PAT low stroke risk  Patient has failed these meds in past: Patient is currently controlled on the following medications:  . Digoxin 0.125 mg tablet once daily  . Metoprolol succinate 25 mg daily  Plan  Continue current medications  Hypokalemia   Lab Results  Component Value Date   K 3.8  05/13/2020   K 4.0 04/07/2020   K 4.5 10/02/2019   Potassium at goal . Patient is currently controlled on the following medications:  . Klor-con 20 meq tablet - three tablets (60 meq) once daily  Plan  Continue current medications.  Hyperlipidemia      Component Value Date/Time   CHOL 180 04/07/2020 0915   TRIG 110.0 04/07/2020 0915   HDL 47.10 04/07/2020 0915  [place] 111 (H) 04/07/2020 0915   LDLDIRECT 93.0 09/05/2017 0850   The 10-year ASCVD risk score Mikey Bussing DC Jr., et al., 2013) is: 10.3%   Values used to calculate the score:     Age: 10 years     Sex: Female     Is Non-Hispanic African American: No     Diabetic: No     Tobacco smoker: No     Systolic Blood Pressure: [id number] mmHg     Is BP treated: Yes     HDL Cholesterol: 47.1 mg/dL     Total Cholesterol: 180 mg/dL   Denies side effect or tolerability issues. CAD. Patient is currently not at goal on the following medications:   Atorvastatin 20 mg daily   Plan  Continue current medications.   Consider dose increase to 40 mg if elevated at f/u.  Fibromyalgia    Reports taking for ~10 years. Denies any side effects at this time, requesting a refill today.  Patient is currently controlled on the following medications:  . Amitriptyline 50 mg tablet daily   Plan  Continue current medications.  Anxiety   Reports that she does not take routinely. Feels like medication is still benefiting when she does need it. Denies any side effects. Patient is currently controlled on the following medications:  . Alprazolam 0.5 mg tablet two times daily as needed for anxiety   Plan  Continue current medications.   Hypothyroidism   Lab Results  Component Value Date/Time   TSH 3.59 05/13/2020 01:31 PM   TSH 3.56 04/07/2020 09:15 AM   TSH 0.33 (L) 11/01/2018 12:09 PM   TSH near goal. Physical with PCP 03/2020. Patient is currently controlled on the following medications:  . Levothyroxine 50 mcg daily  Plan  Continue  current medications.  Vaccines   Reviewed and discussed patient's vaccination history.   Immunization History  Administered Date(s) Administered  . Fluad Quad(high Dose 65+) 10/02/2019  . Influenza Whole 06/22/2009  . Influenza, High Dose Seasonal PF 07/06/2017, 08/01/2018  . PFIZER SARS-COV-2 Vaccination 10/18/2019, 11/13/2019, 05/28/2020  . Pneumococcal Conjugate-13 11/18/2015  . Pneumococcal Polysaccharide-23 08/23/2003, 07/06/2017  . Tdap 07/10/2012   Had not received shingrix, pt still considering.  Plan  Recommended patient receive Shingrix vaccine in pharmacy.   Medication Management   Receives prescription medications from:  [facility], [address] [place] [address] Phone: [phone] Fax: [phone]  Denies any issues with current medication management.   Plan  Continue current medication management strategy.  Follow up: 6 month phone visit. ______________ Visit Information SDOH (Social Determinants of Health) assessments performed: Yes. Future Appointments  Date Time Provider [place]  02/26/2021  1:30 PM [facility] CCM PHARMACIST [facility]   Madelin Rear, Pharm.D., BCGP Clinical Pharmacist [facility] [phone]

## 2020-08-28 NOTE — Progress Notes (Signed)
Rx sent to preferred pharmacy.

## 2020-08-28 NOTE — Patient Instructions (Signed)
Kaitlyn Adams,  Thank you for taking the time to review your medications with me today.  I have included our care plan/goals in the following pages. Please review and call me at [phone] with any questions!  Thanks! Ellin Mayhew, Pharm.D., BCGP Clinical Pharmacist [facility] [phone]  Goals Addressed            This Visit's Progress   . PharmD Care Plan   On track    CARE PLAN ENTRY  Current Barriers:  . Chronic Disease Management support, education, and care coordination needs related to: high blood pressure, high cholesterol, atrial fibrillation, orthostatic hypotension.  AFib . Pharmacist Clinical Goal(s): o Over the next 180 days, patient will work with PharmD and providers to maintain BP goal <140/90 and current heart rate ~&)s . Current regimen:  . Digoxin 0.125 mg tablet once daily  . Metoprolol succinate 25 mg daily . Interventions: o Continue current management . Patient self care activities - Over the next 180 days, patient will: o Check BP/HR at least once every 1-2 weeks, document, and provide at future appointments  Hyperlipidemia . Pharmacist Clinical Goal(s): o Over the next 180 days, patient will work with PharmD and providers to maintain LDL goal < 100 . Current regimen:  o Atorvastatin 20 mg daily  . Interventions: o Continue current management . Patient self care activities - Over the next 180 days, patient will: o Continue current management  Orthostatic Hypotension . Pharmacist Clinical Goal(s) o Over the next 180 days, patient will work with PharmD and providers to minimize symptoms of orthostasis . Current regimen:  o Fludrocortisone 0.1 mg daily  . Interventions: o Continue current management . Patient self care activities - Over the next 180 days, patient will: o Continue current management  Medication management . Pharmacist Clinical Goal(s): o Over the next 180 days, patient will work  with PharmD and providers to maintain optimal medication adherence . Current pharmacy: [facility] . Interventions o Comprehensive medication review performed. o Continue current medication management strategy . Patient self care activities - Over the next 180 days, patient will: o Take medications as prescribed o Report any questions or concerns to PharmD and/or provider(s)  Initial goal documentation.      The patient verbalized understanding of instructions provided today and agreed to receive a MyChart copy of patient instruction and/or educational materials. Telephone follow up appointment with pharmacy team member scheduled for: See next appointment with "Care Management Staff" under "What's Next" below.

## 2020-09-22 ENCOUNTER — Other Ambulatory Visit: Payer: Self-pay | Admitting: Cardiology

## 2020-09-23 DIAGNOSIS — E039 Hypothyroidism, unspecified: Secondary | ICD-10-CM | POA: Diagnosis not present

## 2020-09-23 DIAGNOSIS — E2740 Unspecified adrenocortical insufficiency: Secondary | ICD-10-CM | POA: Diagnosis not present

## 2020-09-23 DIAGNOSIS — E559 Vitamin D deficiency, unspecified: Secondary | ICD-10-CM | POA: Diagnosis not present

## 2020-09-30 DIAGNOSIS — M899 Disorder of bone, unspecified: Secondary | ICD-10-CM | POA: Diagnosis not present

## 2020-09-30 DIAGNOSIS — E039 Hypothyroidism, unspecified: Secondary | ICD-10-CM | POA: Diagnosis not present

## 2020-09-30 DIAGNOSIS — E2740 Unspecified adrenocortical insufficiency: Secondary | ICD-10-CM | POA: Diagnosis not present

## 2020-09-30 DIAGNOSIS — E876 Hypokalemia: Secondary | ICD-10-CM | POA: Diagnosis not present

## 2020-09-30 DIAGNOSIS — E559 Vitamin D deficiency, unspecified: Secondary | ICD-10-CM | POA: Diagnosis not present

## 2020-10-02 ENCOUNTER — Other Ambulatory Visit: Payer: Self-pay | Admitting: Endocrinology

## 2020-10-02 DIAGNOSIS — E2740 Unspecified adrenocortical insufficiency: Secondary | ICD-10-CM

## 2020-10-07 DIAGNOSIS — Z1231 Encounter for screening mammogram for malignant neoplasm of breast: Secondary | ICD-10-CM | POA: Diagnosis not present

## 2020-10-07 LAB — HM MAMMOGRAPHY

## 2020-10-09 NOTE — Progress Notes (Signed)
Subjective:   Kaitlyn Adams is a 70 y.o. female who presents for Medicare Annual (Subsequent) preventive examination.  I connected with Lun today by telephone and verified that I am speaking with the correct person using two identifiers. Location patient: home Location provider: work Persons participating in the virtual visit: patient, Marine scientist.    I discussed the limitations, risks, security and privacy concerns of performing an evaluation and management service by telephone and the availability of in person appointments. I also discussed with the patient that there may be a patient responsible charge related to this service. The patient expressed understanding and verbally consented to this telephonic visit.    Interactive audio and video telecommunications were attempted between this provider and patient, however failed, due to patient having technical difficulties OR patient did not have access to video capability.  We continued and completed visit with audio only.  Some vital signs may be absent or patient reported.   Time Spent with patient on telephone encounter: 25 minutes   Review of Systems     Cardiac Risk Factors include: advanced age (>55men, >55 women);dyslipidemia;sedentary lifestyle     Objective:    Today's Vitals   10/12/20 0815  BP: 108/73  Pulse: 75  Weight: 106 lb (48.1 kg)  Height: 5\' 4"  (1.626 m)   Body mass index is 18.19 kg/m.  Advanced Directives 10/12/2020 10/02/2019 10/25/2018 10/25/2018 08/01/2018 07/11/2018 07/06/2017  Does Patient Have a Medical Advance Directive? Yes Yes No No Yes No No  Type of Paramedic of [place];Living will [place];Living will - - Living will - -  Does patient want to make changes to medical advance directive? - No - Patient declined - - Yes (MAU/Ambulatory/Procedural Areas - Information given) - -  Copy of [place] in Chart? Yes - validated most recent copy  scanned in chart (See row information) Yes - validated most recent copy scanned in chart (See row information) - - - - -  Would patient like information on creating a medical advance directive? - - No - Patient declined No - Patient declined - Yes (MAU/Ambulatory/Procedural Areas - Information given) Yes (MAU/Ambulatory/Procedural Areas - Information given)    Current Medications (verified) Outpatient Encounter Medications as of 10/12/2020  Medication Sig  . Acetaminophen (TYLENOL ARTHRITIS PAIN PO) Take 325 mg by mouth daily as needed (FOR MILD PAIN).  Marland Kitchen ALPRAZolam (XANAX) 0.5 MG tablet Take 1 tablet (0.5 mg total) by mouth 2 (two) times daily as needed for anxiety.  Marland Kitchen amitriptyline (ELAVIL) 50 MG tablet TAKE 1 TABLET(50 MG) BY MOUTH AT BEDTIME  . atorvastatin (LIPITOR) 20 MG tablet TAKE 1 TABLET(20 MG) BY MOUTH DAILY  . digoxin (LANOXIN) 0.125 MG tablet TAKE 1 TABLET BY MOUTH DAILY  . fludrocortisone (FLORINEF) 0.1 MG tablet TAKE 1 TABLET(0.1 MG) BY MOUTH DAILY  . furosemide (LASIX) 20 MG tablet Take 1 tablet (20 mg total) by mouth daily.  . hydrocortisone (CORTEF) 10 MG tablet Take 10 mg by mouth daily.  . hydroxypropyl methylcellulose / hypromellose (ISOPTO TEARS / GONIOVISC) 2.5 % ophthalmic solution Place 2 drops into both eyes daily as needed for dry eyes.  Marland Kitchen levothyroxine (SYNTHROID) 50 MCG tablet Take 50 mcg by mouth every morning.  . metoprolol succinate (TOPROL-XL) 25 MG 24 hr tablet TAKE 1 TABLET(25 MG) BY MOUTH DAILY  . Multiple Vitamins-Minerals (MULTIVITAMIN ADULT PO) Take 1 tablet by mouth daily.  . potassium chloride SA (K-DUR,KLOR-CON) 20 MEQ tablet TAKE 1  TABLET(20 MEQ) BY MOUTH DAILY (Patient taking differently: Take 60 mEq by mouth daily.)  . simethicone (MYLICON) 80 MG chewable tablet Chew 160 mg by mouth every 6 (six) hours as needed for flatulence.   No facility-administered encounter medications on file as of 10/12/2020.    Allergies (verified) Fenofibrate,  Erythromycin, Guaifenesin er, and Sulfonamide derivatives   History: Past Medical History:  Diagnosis Date  . Anemia   . Asthma    - normal spirometry 2009 FEV1  >90% predicted.  - methacoline challenge neg 11/09  . Bronchitis    recurrent  . Cataract   . Chronic cough    - sinus CT 03-23-10 > neg.  - allergy profile 03-23-10 > nl, IgE 14.  - flutter valve rx 03-23-10  . Fibromyalgia   . GERD (gastroesophageal reflux disease)    exacerbating VCD  . Glaucoma   . Hyperlipidemia   . Hypertension   . Hyperthyroidism    with hot nodule.  - total thyroidectomy 11-05-08 benign -> subsequent hypothyroidism.  Marland Kitchen Hypokalemia   . Hypothyroidism   . Hypothyroidism    a. following thyroidectomy.  . Mild CAD    a. LHC 04/2015 - mild nonobstructive CAD with 30% dLAD, 40% D1, 25% pLCx, 30% mRCA, 30% RPDA, normal EF >65% with normal LVEDP.  . Orthostatic hypotension   . Osteoarthritis   . Paroxysmal atrial fibrillation (HCC)   . Renal insufficiency   . Vasomotor rhinitis    exacerbates VCD  . Vocal cord dysfunction    proven on FOB 9/09   Past Surgical History:  Procedure Laterality Date  . ABDOMINAL HYSTERECTOMY  1980  . ABDOMINAL HYSTERECTOMY    . BASAL CELL CARCINOMA EXCISION  11/08  . CARDIAC CATHETERIZATION N/A 05/15/2015   Procedure: Left Heart Cath and Coronary Angiography;  Surgeon: Sherren Mocha, MD;  Location: [facility];  Service: Cardiovascular;  Laterality: N/A;  . COLONOSCOPY N/A 04/27/2017   Dr Silverio Decamp for scant rectal bleeding and iron def anemia: Melanosis coli, lipomatous IC valve, left sided tics, non-bleeding hemorrhoid, 10 mm transverse polyp.    . ESOPHAGOGASTRODUODENOSCOPY N/A 04/27/2017   Mauri Pole, MD; [facility] ENDOSCOPY. for iron def anemia.  Monilial/candidial esophagitis. [facility].    . EYE SURGERY    . scar tissue removal  1982  . THYROID SURGERY  2010  . THYROIDECTOMY  11-05-08   Family History  Problem Relation Age of Onset  . Asthma Mother   .  Heart disease Mother   . Emphysema Father   . Heart disease Father   . Irregular heart beat Brother   . Asthma Grandchild    Social History   Socioeconomic History  . Marital status: Married    Spouse name: Not on file  . Number of children: 1  . Years of education: Not on file  . Highest education level: Not on file  Occupational History  . Occupation: owns a Scientist, research (physical sciences)  . Occupation: OWNER    Employer: [employer]  Tobacco Use  . Smoking status: Never Smoker  . Smokeless tobacco: Never Used  Vaping Use  . Vaping Use: Never used  Substance and Sexual Activity  . Alcohol use: Yes    Alcohol/week: 1.0 standard drink    Types: 1 Glasses of wine per week    Comment: occasional wine or beer  . Drug use: No  . Sexual activity: Yes    Birth control/protection: None  Other Topics Concern  . Not  on file  Social History Narrative  . Not on file   Social Determinants of Health   Financial Resource Strain: Low Risk   . Difficulty of Paying Living Expenses: Not hard at all  Food Insecurity: No Food Insecurity  . Worried About Charity fundraiser in the Last Year: Never true  . Ran Out of Food in the Last Year: Never true  Transportation Needs: No Transportation Needs  . Lack of Transportation (Medical): No  . Lack of Transportation (Non-Medical): No  Physical Activity: Insufficiently Active  . Days of Exercise per Week: 3 days  . Minutes of Exercise per Session: 30 min  Stress: No Stress Concern Present  . Feeling of Stress : Only a little  Social Connections: Moderately Isolated  . Frequency of Communication with Friends and Family: More than three times a week  . Frequency of Social Gatherings with Friends and Family: More than three times a week  . Attends Religious Services: Never  . Active Member of Clubs or Organizations: No  . Attends Archivist Meetings: Never  . Marital Status: Married    Tobacco Counseling Counseling given: Not  Answered   Clinical Intake:  Pre-visit preparation completed: Yes  Pain : No/denies pain     Nutritional Status: BMI <19  Underweight Nutritional Risks: None Diabetes: No  How often do you need to have someone help you when you read instructions, pamphlets, or other written materials from your doctor or pharmacy?: 1 - Never  Diabetic?No  Interpreter Needed?: No  Information entered by :: Caroleen Hamman LPN   Activities of Daily Living In your present state of health, do you have any difficulty performing the following activities: 10/12/2020 05/13/2020  Hearing? Y N  Comment pt has noticed some hearing loss -  Vision? N N  Difficulty concentrating or making decisions? Y N  Comment sometimes forgets names or where she places items -  Walking or climbing stairs? N N  Dressing or bathing? N N  Doing errands, shopping? N N  Preparing Food and eating ? N -  Using the Toilet? N -  In the past six months, have you accidently leaked urine? N -  Do you have problems with loss of bowel control? N -  Managing your Medications? N -  Managing your Finances? N -  Housekeeping or managing your Housekeeping? N -  Some recent data might be hidden    Patient Care Team: Midge Minium, MD as PCP - General (Family Medicine) [place], Peter M, MD as PCP - Cardiology (Cardiology) Jacelyn Pi, MD as Consulting Physician (Endocrinology) Jola Schmidt, MD as Consulting Physician (Ophthalmology) [place], Peter M, MD as Consulting Physician (Cardiology) Tanda Rockers, MD as Consulting Physician (Pulmonary Disease) Madelin Rear, [facility] as Pharmacist (Pharmacist)  Indicate any recent Medical Services you may have received from other than [facility] providers in the past year (date may be approximate).     Assessment:   This is a routine wellness examination for [place].  Hearing/Vision screen  Hearing Screening   125Hz  250Hz  500Hz  1000Hz  2000Hz  3000Hz  4000Hz  6000Hz  8000Hz   Right ear:            Left ear:           Comments: Has noticed some hearing loss  Vision Screening Comments: Reading glasses  Last eye exam-2 2 years ago  Dietary issues and exercise activities discussed: Current Exercise Habits: The patient does not participate in regular exercise at present, Exercise limited by:  None identified  Goals    . patient     Stay active.     . Patient Stated     Attempt to decrease work load, relax.     . PharmD Care Plan     CARE PLAN ENTRY  Current Barriers:  . Chronic Disease Management support, education, and care coordination needs related to: high blood pressure, high cholesterol, atrial fibrillation, orthostatic hypotension.  AFib . Pharmacist Clinical Goal(s): o Over the next 180 days, patient will work with PharmD and providers to maintain BP goal <140/90 and current heart rate ~&)s . Current regimen:  . Digoxin 0.125 mg tablet once daily  . Metoprolol succinate 25 mg daily . Interventions: o Continue current management . Patient self care activities - Over the next 180 days, patient will: o Check BP/HR at least once every 1-2 weeks, document, and provide at future appointments  Hyperlipidemia . Pharmacist Clinical Goal(s): o Over the next 180 days, patient will work with PharmD and providers to maintain LDL goal < 100 . Current regimen:  o Atorvastatin 20 mg daily  . Interventions: o Continue current management . Patient self care activities - Over the next 180 days, patient will: o Continue current management  Orthostatic Hypotension . Pharmacist Clinical Goal(s) o Over the next 180 days, patient will work with PharmD and providers to minimize symptoms of orthostasis . Current regimen:  o Fludrocortisone 0.1 mg daily  . Interventions: o Continue current management . Patient self care activities - Over the next 180 days, patient will: o Continue current management  Medication management . Pharmacist Clinical Goal(s): o Over the next 180  days, patient will work with PharmD and providers to maintain optimal medication adherence . Current pharmacy: [facility] . Interventions o Comprehensive medication review performed. o Continue current medication management strategy . Patient self care activities - Over the next 180 days, patient will: o Take medications as prescribed o Report any questions or concerns to PharmD and/or provider(s)  Initial goal documentation.      Depression Screen PHQ 2/9 Scores 10/12/2020 05/13/2020 04/07/2020 02/04/2020 10/02/2019 02/26/2019 08/01/2018  PHQ - 2 Score 0 0 0 2 0 0 0  PHQ- 9 Score - - 0 - - 0 0    Fall Risk Fall Risk  10/12/2020 05/13/2020 04/07/2020 10/02/2019 02/26/2019  Falls in the past year? 1 1 0 1 0  Comment - - - - -  Number falls in past yr: 1 1 0 1 0  Injury with Fall? 1 1 0 0 0  Risk Factor Category  - - - - -  Risk for fall due to : History of fall(s) - - History of fall(s) -  Follow up Falls prevention discussed Falls evaluation completed Falls evaluation completed Education provided;Falls prevention discussed;Falls evaluation completed -    FALL RISK PREVENTION PERTAINING TO THE HOME:  Any stairs in or around the home? Yes  If so, are there any without handrails? No  Home free of loose throw rugs in walkways, pet beds, electrical cords, etc? Yes  Adequate lighting in your home to reduce risk of falls? Yes   ASSISTIVE DEVICES UTILIZED TO PREVENT FALLS:  Life alert? No  Use of a cane, walker or w/c? No  Grab bars in the bathroom? No  Shower chair or bench in shower? No  Elevated toilet seat or a handicapped toilet? No   TIMED UP AND GO:  Was the test performed? No .phone visit    Cognitive Function: MMSE -  Mini Mental State Exam 07/11/2018 07/06/2017  Orientation to time 5 5  Orientation to Place 5 5  Registration 3 3  Attention/ Calculation 5 5  Recall 2 3  Language- name 2 objects 2 2  Language- repeat 1 1  Language- follow 3 step command 3 3  Language- read  & follow direction 1 1  Write a sentence 1 1  Copy design 1 1  Total score 29 30     6CIT Screen 10/12/2020  What Year? 0 points  What month? 0 points  What time? 0 points  Count back from 20 0 points  Months in reverse 2 points  Repeat phrase 2 points  Total Score 4    Immunizations Immunization History  Administered Date(s) Administered  . Fluad Quad(high Dose 65+) 10/02/2019  . Influenza Whole 06/22/2009  . Influenza, High Dose Seasonal PF 07/06/2017, 08/01/2018  . PFIZER(Purple Top)SARS-COV-2 Vaccination 10/18/2019, 11/13/2019, 05/28/2020  . Pneumococcal Conjugate-13 11/18/2015  . Pneumococcal Polysaccharide-23 08/23/2003, 07/06/2017  . Tdap 07/10/2012    TDAP status: Up to date  Flu Vaccine status: Up to date  Pneumococcal vaccine status: Up to date  Covid-19 vaccine status: Completed vaccines  Qualifies for Shingles Vaccine? Yes   Zostavax completed No   Shingrix Completed?: No.    Education has been provided regarding the importance of this vaccine. Patient has been advised to call insurance company to determine out of pocket expense if they have not yet received this vaccine. Advised may also receive vaccine at local pharmacy or Health Dept. Verbalized acceptance and understanding.  Screening Tests Health Maintenance  Topic Date Due  . Hepatitis C Screening  Never done  . COLONOSCOPY (Pts 45-2yrs Insurance coverage will need to be confirmed)  06/01/2018  . DEXA SCAN  03/16/2019  . INFLUENZA VACCINE  03/22/2020  . MAMMOGRAM  10/01/2020  . TETANUS/TDAP  07/10/2022  . COVID-19 Vaccine  Completed  . PNA vac Low Risk Adult  Completed    Health Maintenance  Health Maintenance Due  Topic Date Due  . Hepatitis C Screening  Never done  . COLONOSCOPY (Pts 45-46yrs Insurance coverage will need to be confirmed)  06/01/2018  . DEXA SCAN  03/16/2019  . INFLUENZA VACCINE  03/22/2020  . MAMMOGRAM  10/01/2020    Colorectal Cancer screening: Due-Patient declined  today but will call GI when she is ready to schedule.  Mammogram status: Completed Bilateral-10/03/2021. Repeat every year per patient-Awaiting results  Bone Density status: Due-Patient states she has an appt scheduled for 03/2021  Lung Cancer Screening: (Low Dose CT Chest recommended if Age 29-80 years, 30 pack-year currently smoking OR have quit w/in 15years.) does not qualify.    Additional Screening:  Hepatitis C Screening: does qualify; Phone visit-patient to discuss with PCP at next office visit.  Vision Screening: Recommended annual ophthalmology exams for early detection of glaucoma and other disorders of the eye. Is the patient up to date with their annual eye exam?  No  Who is the provider or what is the name of the office in which the patient attends annual eye exams? Dr. Valetta Close Patient states she does not feel comfortable going to the eye doctor at this time due to Covid-She states she will make an appt when she feels she is safe to do so.  Dental Screening: Recommended annual dental exams for proper oral hygiene  Community Resource Referral / Chronic Care Management: CRR required this visit?  No   CCM required this visit?  No  Plan:     I have personally reviewed and noted the following in the patient's chart:   . Medical and social history . Use of alcohol, tobacco or illicit drugs  . Current medications and supplements . Functional ability and status . Nutritional status . Physical activity . Advanced directives . List of other physicians . Hospitalizations, surgeries, and ER visits in previous 12 months . Vitals . Screenings to include cognitive, depression, and falls . Referrals and appointments  In addition, I have reviewed and discussed with patient certain preventive protocols, quality metrics, and best practice recommendations. A written personalized care plan for preventive services as well as general preventive health recommendations were provided  to patient.   Due to this being a telephonic visit, the after visit summary with patients personalized plan was offered to patient via mail or my-chart. Patient would like to access on my-chart.   Marta Antu, LPN   04/08/7115  Nurse Health Advisor  Nurse Notes: Patient is requesting a referral to Audiology due to decreased hearing. Message sent to PCP.

## 2020-10-12 ENCOUNTER — Telehealth: Payer: Self-pay

## 2020-10-12 ENCOUNTER — Ambulatory Visit (INDEPENDENT_AMBULATORY_CARE_PROVIDER_SITE_OTHER): Payer: Medicare Other

## 2020-10-12 VITALS — BP 108/73 | HR 75 | Ht 64.0 in | Wt 106.0 lb

## 2020-10-12 DIAGNOSIS — Z0000 Encounter for general adult medical examination without abnormal findings: Secondary | ICD-10-CM | POA: Diagnosis not present

## 2020-10-12 NOTE — Telephone Encounter (Signed)
We are due for a cholesterol follow up.  We can place referral when she comes in for visit

## 2020-10-12 NOTE — Patient Instructions (Signed)
Kaitlyn Adams , Thank you for taking time to complete your Medicare Wellness Visit. I appreciate your ongoing commitment to your health goals. Please review the following plan we discussed and let me know if I can assist you in the future.   Screening recommendations/referrals: Colonoscopy: Due-Declined today. Please call when you are ready to schedule. Mammogram: Completed 2/08/27/2020-Due 10/07/2021 Bone Density: Per our conversation, scheduled for 03/2021 Recommended yearly ophthalmology/optometry visit for glaucoma screening and checkup Recommended yearly dental visit for hygiene and checkup  Vaccinations: Influenza vaccine: Due-May obtain vaccine at our office or your local pharmacy. Pneumococcal vaccine: Completed vaccines Tdap vaccine: Up to date- Due-07/10/2022 Shingles vaccine: Discuss with pharmacy  Covid-19:Completed vaccines  Advanced directives: Copy in chart  Conditions/risks identified: See problem list  Next appointment: Follow up in one year for your annual wellness visit 10/18/2021 @ 8:15   Preventive Care 65 Years and Older, Female Preventive care refers to lifestyle choices and visits with your health care provider that can promote health and wellness. What does preventive care include?  A yearly physical exam. This is also called an annual well check.  Dental exams once or twice a year.  Routine eye exams. Ask your health care provider how often you should have your eyes checked.  Personal lifestyle choices, including:  Daily care of your teeth and gums.  Regular physical activity.  Eating a healthy diet.  Avoiding tobacco and drug use.  Limiting alcohol use.  Practicing safe sex.  Taking low-dose aspirin every day.  Taking vitamin and mineral supplements as recommended by your health care provider. What happens during an annual well check? The services and screenings done by your health care provider during your annual well check will depend on  your age, overall health, lifestyle risk factors, and family history of disease. Counseling  Your health care provider may ask you questions about your:  Alcohol use.  Tobacco use.  Drug use.  Emotional well-being.  Home and relationship well-being.  Sexual activity.  Eating habits.  History of falls.  Memory and ability to understand (cognition).  Work and work Statistician.  Reproductive health. Screening  You may have the following tests or measurements:  Height, weight, and BMI.  Blood pressure.  Lipid and cholesterol levels. These may be checked every 5 years, or more frequently if you are over 48 years old.  Skin check.  Lung cancer screening. You may have this screening every year starting at age 84 if you have a 30-pack-year history of smoking and currently smoke or have quit within the past 15 years.  Fecal occult blood test (FOBT) of the stool. You may have this test every year starting at age 62.  Flexible sigmoidoscopy or colonoscopy. You may have a sigmoidoscopy every 5 years or a colonoscopy every 10 years starting at age 29.  Hepatitis C blood test.  Hepatitis B blood test.  Sexually transmitted disease (STD) testing.  Diabetes screening. This is done by checking your blood sugar (glucose) after you have not eaten for a while (fasting). You may have this done every 1-3 years.  Bone density scan. This is done to screen for osteoporosis. You may have this done starting at age 24.  Mammogram. This may be done every 1-2 years. Talk to your health care provider about how often you should have regular mammograms. Talk with your health care provider about your test results, treatment options, and if necessary, the need for more tests. Vaccines  Your health care provider may recommend  certain vaccines, such as:  Influenza vaccine. This is recommended every year.  Tetanus, diphtheria, and acellular pertussis (Tdap, Td) vaccine. You may need a Td booster  every 10 years.  Zoster vaccine. You may need this after age 22.  Pneumococcal 13-valent conjugate (PCV13) vaccine. One dose is recommended after age 21.  Pneumococcal polysaccharide (PPSV23) vaccine. One dose is recommended after age 92. Talk to your health care provider about which screenings and vaccines you need and how often you need them. This information is not intended to replace advice given to you by your health care provider. Make sure you discuss any questions you have with your health care provider. Document Released: 09/04/2015 Document Revised: 04/27/2016 Document Reviewed: 06/09/2015 Elsevier Interactive Patient Education  2017 [employer] Prevention in the Home Falls can cause injuries. They can happen to people of all ages. There are many things you can do to make your home safe and to help prevent falls. What can I do on the outside of my home?  Regularly fix the edges of walkways and driveways and fix any cracks.  Remove anything that might make you trip as you walk through a door, such as a raised step or threshold.  Trim any bushes or trees on the path to your home.  Use bright outdoor lighting.  Clear any walking paths of anything that might make someone trip, such as rocks or tools.  Regularly check to see if handrails are loose or broken. Make sure that both sides of any steps have handrails.  Any raised decks and porches should have guardrails on the edges.  Have any leaves, snow, or ice cleared regularly.  Use sand or salt on walking paths during winter.  Clean up any spills in your garage right away. This includes oil or grease spills. What can I do in the bathroom?  Use night lights.  Install grab bars by the toilet and in the tub and shower. Do not use towel bars as grab bars.  Use non-skid mats or decals in the tub or shower.  If you need to sit down in the shower, use a plastic, non-slip stool.  Keep the floor dry. Clean up any  water that spills on the floor as soon as it happens.  Remove soap buildup in the tub or shower regularly.  Attach bath mats securely with double-sided non-slip rug tape.  Do not have throw rugs and other things on the floor that can make you trip. What can I do in the bedroom?  Use night lights.  Make sure that you have a light by your bed that is easy to reach.  Do not use any sheets or blankets that are too big for your bed. They should not hang down onto the floor.  Have a firm chair that has side arms. You can use this for support while you get dressed.  Do not have throw rugs and other things on the floor that can make you trip. What can I do in the kitchen?  Clean up any spills right away.  Avoid walking on wet floors.  Keep items that you use a lot in easy-to-reach places.  If you need to reach something above you, use a strong step stool that has a grab bar.  Keep electrical cords out of the way.  Do not use floor polish or wax that makes floors slippery. If you must use wax, use non-skid floor wax.  Do not have throw rugs and other  things on the floor that can make you trip. What can I do with my stairs?  Do not leave any items on the stairs.  Make sure that there are handrails on both sides of the stairs and use them. Fix handrails that are broken or loose. Make sure that handrails are as long as the stairways.  Check any carpeting to make sure that it is firmly attached to the stairs. Fix any carpet that is loose or worn.  Avoid having throw rugs at the top or bottom of the stairs. If you do have throw rugs, attach them to the floor with carpet tape.  Make sure that you have a light switch at the top of the stairs and the bottom of the stairs. If you do not have them, ask someone to add them for you. What else can I do to help prevent falls?  Wear shoes that:  Do not have high heels.  Have rubber bottoms.  Are comfortable and fit you well.  Are closed  at the toe. Do not wear sandals.  If you use a stepladder:  Make sure that it is fully opened. Do not climb a closed stepladder.  Make sure that both sides of the stepladder are locked into place.  Ask someone to hold it for you, if possible.  Clearly mark and make sure that you can see:  Any grab bars or handrails.  First and last steps.  Where the edge of each step is.  Use tools that help you move around (mobility aids) if they are needed. These include:  Canes.  Walkers.  Scooters.  Crutches.  Turn on the lights when you go into a dark area. Replace any light bulbs as soon as they burn out.  Set up your furniture so you have a clear path. Avoid moving your furniture around.  If any of your floors are uneven, fix them.  If there are any pets around you, be aware of where they are.  Review your medicines with your doctor. Some medicines can make you feel dizzy. This can increase your chance of falling. Ask your doctor what other things that you can do to help prevent falls. This information is not intended to replace advice given to you by your health care provider. Make sure you discuss any questions you have with your health care provider. Document Released: 06/04/2009 Document Revised: 01/14/2016 Document Reviewed: 09/12/2014 Elsevier Interactive Patient Education  2017 [employer].

## 2020-10-12 NOTE — Telephone Encounter (Signed)
Patient has been scheduled for 11/06/20 at 10:30 for her 6 month recheck for cholesterol.

## 2020-10-12 NOTE — Telephone Encounter (Signed)
Patient is requesting a referral to an Audiologist due to decreased hearing. She would like to see a [facility] provider.

## 2020-10-18 ENCOUNTER — Other Ambulatory Visit: Payer: Self-pay | Admitting: Family Medicine

## 2020-10-21 ENCOUNTER — Ambulatory Visit
Admission: RE | Admit: 2020-10-21 | Discharge: 2020-10-21 | Disposition: A | Discharge status: Home / Self Care | Payer: Medicare Other | Source: Ambulatory Visit | Attending: Endocrinology | Admitting: Endocrinology

## 2020-10-21 ENCOUNTER — Other Ambulatory Visit: Payer: Self-pay

## 2020-10-21 DIAGNOSIS — R55 Syncope and collapse: Secondary | ICD-10-CM | POA: Diagnosis not present

## 2020-10-21 DIAGNOSIS — E2740 Unspecified adrenocortical insufficiency: Secondary | ICD-10-CM

## 2020-10-21 MED ORDER — GADOBENATE DIMEGLUMINE 529 MG/ML IV SOLN
9.0000 mL | Freq: Once | INTRAVENOUS | Status: AC | PRN
  Administered 2020-10-21: 9 mL via INTRAVENOUS

## 2020-11-06 ENCOUNTER — Encounter: Payer: Self-pay | Admitting: Family Medicine

## 2020-11-06 ENCOUNTER — Other Ambulatory Visit: Payer: Self-pay

## 2020-11-06 ENCOUNTER — Ambulatory Visit (INDEPENDENT_AMBULATORY_CARE_PROVIDER_SITE_OTHER): Payer: Medicare Other | Admitting: Family Medicine

## 2020-11-06 VITALS — BP 117/80 | HR 80 | Temp 97.6°F | Resp 16 | Ht 64.0 in | Wt 106.8 lb

## 2020-11-06 DIAGNOSIS — E440 Moderate protein-calorie malnutrition: Secondary | ICD-10-CM | POA: Diagnosis not present

## 2020-11-06 DIAGNOSIS — I2510 Atherosclerotic heart disease of native coronary artery without angina pectoris: Secondary | ICD-10-CM | POA: Diagnosis not present

## 2020-11-06 DIAGNOSIS — E2740 Unspecified adrenocortical insufficiency: Secondary | ICD-10-CM | POA: Diagnosis not present

## 2020-11-06 DIAGNOSIS — E559 Vitamin D deficiency, unspecified: Secondary | ICD-10-CM | POA: Insufficient documentation

## 2020-11-06 DIAGNOSIS — E039 Hypothyroidism, unspecified: Secondary | ICD-10-CM | POA: Diagnosis not present

## 2020-11-06 DIAGNOSIS — M899 Disorder of bone, unspecified: Secondary | ICD-10-CM | POA: Insufficient documentation

## 2020-11-06 DIAGNOSIS — E785 Hyperlipidemia, unspecified: Secondary | ICD-10-CM | POA: Diagnosis not present

## 2020-11-06 LAB — LIPID PANEL
Cholesterol: 183 mg/dL (ref 0–200)
HDL: 48.2 mg/dL (ref >39.00)
NonHDL: 135.01
Total CHOL/HDL Ratio: 4
Triglycerides: 231 mg/dL — ABNORMAL HIGH (ref 0.0–149.0)
VLDL: 46.2 mg/dL — ABNORMAL HIGH (ref 0.0–40.0)

## 2020-11-06 LAB — BASIC METABOLIC PANEL
BUN: 11 mg/dL (ref 6–23)
CO2: 29 mEq/L (ref 19–32)
Calcium: 10.4 mg/dL (ref 8.4–10.5)
Chloride: 101 mEq/L (ref 96–112)
Creatinine, Ser: 1.13 mg/dL (ref 0.40–1.20)
GFR: 49.07 mL/min — ABNORMAL LOW (ref >60.00)
Glucose, Bld: 90 mg/dL (ref 70–99)
Potassium: 3.5 mEq/L (ref 3.5–5.1)
Sodium: 140 mEq/L (ref 135–145)

## 2020-11-06 LAB — CBC WITH DIFFERENTIAL/PLATELET
Basophils Absolute: 0.1 10*3/uL (ref 0.0–0.1)
Basophils Relative: 0.9 % (ref 0.0–3.0)
Eosinophils Absolute: 0.1 10*3/uL (ref 0.0–0.7)
Eosinophils Relative: 1.3 % (ref 0.0–5.0)
HCT: 46.2 % — ABNORMAL HIGH (ref 36.0–46.0)
Hemoglobin: 15.6 g/dL — ABNORMAL HIGH (ref 12.0–15.0)
Lymphocytes Relative: 17.9 % (ref 12.0–46.0)
Lymphs Abs: 1.4 10*3/uL (ref 0.7–4.0)
MCHC: 33.8 g/dL (ref 30.0–36.0)
MCV: 83.6 fl (ref 78.0–100.0)
Monocytes Absolute: 0.7 10*3/uL (ref 0.1–1.0)
Monocytes Relative: 8.6 % (ref 3.0–12.0)
Neutro Abs: 5.5 10*3/uL (ref 1.4–7.7)
Neutrophils Relative %: 71.3 % (ref 43.0–77.0)
Platelets: 321 10*3/uL (ref 150.0–400.0)
RBC: 5.52 Mil/uL — ABNORMAL HIGH (ref 3.87–5.11)
RDW: 13.1 % (ref 11.5–15.5)
WBC: 7.7 10*3/uL (ref 4.0–10.5)

## 2020-11-06 LAB — HEPATIC FUNCTION PANEL
ALT: 19 U/L (ref 0–35)
AST: 24 U/L (ref 0–37)
Albumin: 4.4 g/dL (ref 3.5–5.2)
Alkaline Phosphatase: 78 U/L (ref 39–117)
Bilirubin, Direct: 0.1 mg/dL (ref 0.0–0.3)
Total Bilirubin: 0.6 mg/dL (ref 0.2–1.2)
Total Protein: 7.9 g/dL (ref 6.0–8.3)

## 2020-11-06 LAB — LDL CHOLESTEROL, DIRECT: Direct LDL: 98 mg/dL

## 2020-11-06 LAB — TSH: TSH: 2.31 u[IU]/mL (ref 0.35–4.50)

## 2020-11-06 MED ORDER — FUROSEMIDE 20 MG PO TABS: 20.0000 mg | ORAL_TABLET | Freq: Every day | ORAL | 1 refills | Status: DC

## 2020-11-06 NOTE — Assessment & Plan Note (Signed)
Chronic problem.  Did not see TSH in labs done by Dr Chalmers Cater.  She is also on Dig which can impact thyroid fxn.  Check labs.  Adjust meds prn

## 2020-11-06 NOTE — Assessment & Plan Note (Signed)
Pt reports she is eating more regularly now but admits that her husband says she doesn't eat enough.  Again reviewed the importance of eating enough protein and calories to maintain her level of function.  Will continue to follow.

## 2020-11-06 NOTE — Assessment & Plan Note (Signed)
New.  Now on Hydrocortisone 10mg  daily.  She is having some side effects of steroids- including water retention.  Lasix refilled for pt.  Will follow along.

## 2020-11-06 NOTE — Progress Notes (Signed)
   Subjective:    Patient ID: Kaitlyn Adams, female    DOB: [birth year], 70 y.o.   MRN: [id number]  HPI CAD- chronic problem, on Metoprolol XL 25mg  daily, Lasix 20mg  daily w/ good control.  No CP, SOB above baseline.  + HAs.  Hyperlipidemia- chronic problem, on Lipitor 20mg  daily.  No abd pain, N/V  Hypothyroid- pt is on Levothyroxine 34mcg daily.  Also on Dig which can impact thyroid fxn.  Adrenal Insufficiency- New dx for pt.  On Hydrocortisone 10mg  daily per Dr Chalmers Cater.  Cortisol level was 5.5.  Pt reports she is now retaining fluid on the Hydrocortisone.  Notes swelling of fingers in the morning.  Protein calorie malnutrition- pt reports eating regularly.  Husband states she doesn't eat 'enough' but pt reports she is satisfied.  Pt reports 2-3 meals daily   Review of Systems For ROS see HPI   This visit occurred during the SARS-CoV-2 public health emergency.  Safety protocols were in place, including screening questions prior to the visit, additional usage of staff PPE, and extensive cleaning of exam room while observing appropriate contact time as indicated for disinfecting solutions.       Objective:   Physical Exam Vitals reviewed.  Constitutional:      General: She is not in acute distress.    Appearance: Normal appearance. She is well-developed. She is not ill-appearing.  HENT:     Head: Normocephalic and atraumatic.  Eyes:     Conjunctiva/sclera: Conjunctivae normal.     Pupils: Pupils are equal, round, and reactive to light.  Neck:     Thyroid: No thyromegaly.  Cardiovascular:     Rate and Rhythm: Normal rate and regular rhythm.     Pulses: Normal pulses.     Heart sounds: Normal heart sounds.  Pulmonary:     Effort: Pulmonary effort is normal. No respiratory distress.     Breath sounds: Normal breath sounds.  Abdominal:     General: There is no distension.     Palpations: Abdomen is soft.     Tenderness: There is no abdominal tenderness.  Musculoskeletal:      Cervical back: Normal range of motion and neck supple.     Right lower leg: No edema.     Left lower leg: No edema.  Lymphadenopathy:     Cervical: No cervical adenopathy.  Skin:    General: Skin is warm and dry.  Neurological:     Mental Status: She is alert and oriented to person, place, and time.  Psychiatric:        Behavior: Behavior normal.           Assessment & Plan:

## 2020-11-06 NOTE — Patient Instructions (Signed)
Follow up in 6 months to recheck cholesterol- sooner if needed We'll notify you of your lab results and make any changes if needed Make sure you are eating regularly- you need the fuel to keep going! Call with any questions or concerns Stay Safe!  Stay Healthy!

## 2020-11-06 NOTE — Assessment & Plan Note (Signed)
Chronic problem.  On Lipitor 20mg  daily.  Check labs.  Adjust meds prn

## 2020-11-06 NOTE — Assessment & Plan Note (Signed)
Chronic problem.  On beta blocker and statin.  Currently asymptomatic.  Will continue to follow.

## 2020-11-09 NOTE — Progress Notes (Signed)
Results reviewed via mychart

## 2020-11-17 ENCOUNTER — Other Ambulatory Visit: Payer: Self-pay | Admitting: Family Medicine

## 2020-11-17 DIAGNOSIS — K5909 Other constipation: Secondary | ICD-10-CM

## 2020-12-17 ENCOUNTER — Other Ambulatory Visit: Payer: Self-pay | Admitting: Cardiology

## 2020-12-22 DIAGNOSIS — E2740 Unspecified adrenocortical insufficiency: Secondary | ICD-10-CM | POA: Diagnosis not present

## 2020-12-29 DIAGNOSIS — E559 Vitamin D deficiency, unspecified: Secondary | ICD-10-CM | POA: Diagnosis not present

## 2020-12-29 DIAGNOSIS — E039 Hypothyroidism, unspecified: Secondary | ICD-10-CM | POA: Diagnosis not present

## 2020-12-29 DIAGNOSIS — E876 Hypokalemia: Secondary | ICD-10-CM | POA: Diagnosis not present

## 2020-12-29 DIAGNOSIS — M899 Disorder of bone, unspecified: Secondary | ICD-10-CM | POA: Diagnosis not present

## 2020-12-29 DIAGNOSIS — E2740 Unspecified adrenocortical insufficiency: Secondary | ICD-10-CM | POA: Diagnosis not present

## 2021-02-01 ENCOUNTER — Telehealth (INDEPENDENT_AMBULATORY_CARE_PROVIDER_SITE_OTHER): Payer: Medicare Other | Admitting: Family Medicine

## 2021-02-01 ENCOUNTER — Encounter: Payer: Self-pay | Admitting: Family Medicine

## 2021-02-01 VITALS — Temp 96.9°F

## 2021-02-01 DIAGNOSIS — J410 Simple chronic bronchitis: Secondary | ICD-10-CM | POA: Diagnosis not present

## 2021-02-01 DIAGNOSIS — J329 Chronic sinusitis, unspecified: Secondary | ICD-10-CM

## 2021-02-01 DIAGNOSIS — I2510 Atherosclerotic heart disease of native coronary artery without angina pectoris: Secondary | ICD-10-CM

## 2021-02-01 DIAGNOSIS — B9689 Other specified bacterial agents as the cause of diseases classified elsewhere: Secondary | ICD-10-CM | POA: Diagnosis not present

## 2021-02-01 DIAGNOSIS — Z20822 Contact with and (suspected) exposure to covid-19: Secondary | ICD-10-CM | POA: Diagnosis not present

## 2021-02-01 MED ORDER — AMOXICILLIN-POT CLAVULANATE 875-125 MG PO TABS: 1.0000 | ORAL_TABLET | Freq: Two times a day (BID) | ORAL | 0 refills | Status: DC

## 2021-02-01 MED ORDER — BENZONATATE 200 MG PO CAPS: 200.0000 mg | ORAL_CAPSULE | Freq: Three times a day (TID) | ORAL | 0 refills | Status: DC | PRN

## 2021-02-01 MED ORDER — PROMETHAZINE-DM 6.25-15 MG/5ML PO SYRP: 5.0000 mL | ORAL_SOLUTION | Freq: Four times a day (QID) | ORAL | 0 refills | Status: DC | PRN

## 2021-02-01 NOTE — Progress Notes (Signed)
Virtual Visit via Video   I connected with patient on 02/01/21 at  8:30 AM EDT by a video enabled telemedicine application and verified that I am speaking with the correct person using two identifiers.  Location patient: Home Location provider: [facility], Office Persons participating in the virtual visit: Patient, Provider, [place] (Sabrina M)  I discussed the limitations of evaluation and management by telemedicine and the availability of in person appointments. The patient expressed understanding and agreed to proceed.  Subjective:   HPI:   URI- 'i think it's a bronchitis and sinus infection'.  Sxs started on 6/3.  Initially thought it was allergies.  Worsened over the weekend.  No fevers.  + deep cough w/ yellow sputum.  + HA.  + facial pain/pressure.  Denies body aches above baseline.  Denies chills.  No known sick contacts.  Took home COVID 6 days ago- negative.  ROS:   See pertinent positives and negatives per HPI.  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   Vitamin D deficiency 11/06/2020   Hypokalemia 10/25/2018   Moderate protein-calorie malnutrition ([place]) 08/01/2018   Upper airway cough syndrome 05/22/2017   Symptomatic anemia 04/26/2017   Chronic constipation 07/07/2016   History of colonic polyps 07/07/2016   Chronic anticoagulation 07/07/2016   Iron deficiency anemia 06/15/2016   Anxiety and depression 05/18/2016   Physical exam 11/18/2015   Pain in joint, ankle and foot 11/16/2015   PAT (paroxysmal atrial tachycardia) ([place]) 09/08/2015   Hyperlipidemia 07/30/2015   CAD (coronary artery disease)    Chest pain at rest, not due to CAD, possible due to a fib. 05/15/2015   Chronic bronchitis ([place]) 06/17/2011   Orthostatic hypotension 12/06/2010   Paroxysmal atrial fibrillation ([facility])    Renal insufficiency    Myalgia     Hypothyroidism    Glaucoma    GERD, SEVERE 08/19/2010   Osteoarthritis 04/17/2008   FIBROMYALGIA 04/17/2008    Social History   Tobacco Use   Smoking status: Never   Smokeless tobacco: Never  Substance Use Topics   Alcohol use: Yes    Alcohol/week: 1.0 standard drink    Types: 1 Glasses of wine per week    Comment: occasional wine or beer    Current Outpatient Medications:    Acetaminophen (TYLENOL ARTHRITIS PAIN PO), Take 325 mg by mouth daily as needed (FOR MILD PAIN)., Disp: , Rfl:    ALPRAZolam (XANAX) 0.5 MG tablet, Take 1 tablet (0.5 mg total) by mouth 2 (two) times daily as needed for anxiety., Disp: 60 tablet, Rfl: 3   amitriptyline (ELAVIL) 50 MG tablet, TAKE 1 TABLET(50 MG) BY MOUTH AT BEDTIME, Disp: 90 tablet, Rfl: 0   atorvastatin (LIPITOR) 20 MG tablet, TAKE 1 TABLET(20 MG) BY MOUTH DAILY, Disp: 90 tablet, Rfl: 1   digoxin (LANOXIN) 0.125 MG tablet, Take 1 tablet (125 mcg total) by mouth daily. pls advise pt to make appt with provider for further refills, Disp: 90 tablet, Rfl: 1   fludrocortisone (FLORINEF) 0.1 MG tablet, TAKE 1 TABLET(0.1 MG) BY MOUTH DAILY, Disp: 90 tablet, Rfl: 1   furosemide (LASIX) 20 MG tablet, Take 1 tablet (20 mg total) by mouth daily., Disp: 90 tablet, Rfl: 1   hydrocortisone (CORTEF) 10 MG tablet, Take 10 mg by mouth daily., Disp: , Rfl:    hydroxypropyl methylcellulose / hypromellose (ISOPTO TEARS / GONIOVISC) 2.5 %  ophthalmic solution, Place 2 drops into both eyes daily as needed for dry eyes., Disp: , Rfl:    levothyroxine (SYNTHROID) 50 MCG tablet, Take 50 mcg by mouth every morning., Disp: , Rfl:    metoprolol succinate (TOPROL-XL) 25 MG 24 hr tablet, TAKE 1 TABLET(25 MG) BY MOUTH DAILY, Disp: 90 tablet, Rfl: 1   potassium chloride SA (K-DUR,KLOR-CON) 20 MEQ tablet, TAKE 1 TABLET(20 MEQ) BY MOUTH DAILY (Patient taking differently: Take 60 mEq by mouth daily.), Disp: 30 tablet, Rfl: 0   simethicone (MYLICON) 80 MG chewable tablet, Chew 160 mg  by mouth every 6 (six) hours as needed for flatulence., Disp: , Rfl:    Multiple Vitamins-Minerals (MULTIVITAMIN ADULT PO), Take 1 tablet by mouth daily. (Patient not taking: Reported on 02/01/2021), Disp: , Rfl:   Allergies  Allergen Reactions   Fenofibrate Other (See Comments)    GI side effects    Erythromycin Nausea And Vomiting    REACTION: nausea and vomiting   Guaifenesin Er Palpitations   Sulfonamide Derivatives Nausea And Vomiting    REACTION: nausea and vomiting    Objective:   Temp (!) 96.9 F (36.1 C) (Temporal)  AAOx3, NAD NCAT, EOMI No obvious CN deficits Pt is able to speak clearly, coherently without shortness of breath or increased work of breathing.  + hacking cough Thought process is linear.  Mood is appropriate.   Assessment and Plan:   Bacterial sinusitis- new.  Pt is on day 10 of illness but reports things acutely worsened this weekend.  Start Augmentin to cover likely bacterial infxn.  Pt expressed understanding and is in agreement w/ plan.   Chronic bronchitis- ongoing issue for pt.  Currently deteriorated w/ URI.  Start Augmentin to cover both sinuses and pulmonary process.  Will start Tessalon and Promethazine cough syrup since she is intolerant to guaifenesin.  Reviewed supportive care and red flags that should prompt return. Pt expressed understanding and is in agreement w/ plan.   Suspected COVID- pt took home test 3 days after sxs started and it was negative.  Has not retested since things have worsened.  Encouraged her to do so.  Annye Asa, MD 02/01/2021

## 2021-02-01 NOTE — Progress Notes (Signed)
I connected with  Kaitlyn Adams on 02/01/21 by a video enabled telemedicine application and verified that I am speaking with the correct person using two identifiers.   I discussed the limitations of evaluation and management by telemedicine. The patient expressed understanding and agreed to proceed.

## 2021-02-15 ENCOUNTER — Other Ambulatory Visit: Payer: Self-pay | Admitting: Family Medicine

## 2021-02-15 DIAGNOSIS — K5909 Other constipation: Secondary | ICD-10-CM

## 2021-02-17 ENCOUNTER — Encounter: Payer: Self-pay | Admitting: *Deleted

## 2021-02-26 ENCOUNTER — Telehealth: Payer: Medicare Other

## 2021-03-10 ENCOUNTER — Telehealth: Payer: Self-pay

## 2021-03-10 ENCOUNTER — Telehealth: Payer: Medicare Other

## 2021-03-10 NOTE — Chronic Care Management (AMB) (Signed)
Chronic Care Management Pharmacy Assistant   Name: Kaitlyn Adams  MRN: [id number] DOB: 1951/08/06   Reason for Encounter: General Adherence Disease State Call    Recent office visits:  11/06/2020 [facility] PCP Annye Asa, MD; Now on Hydrocortisone 10mg  daily.  She is having some side effects of steroids- including water retention.  Lasix refilled for pt.  Will follow along.  Recent consult visits:  09/23/2020 [facility]inology Jacelyn Pi, MD;   Hospital visits:  None in previous 6 months  Medications: Outpatient Encounter Medications as of 03/10/2021  Medication Sig Note   Acetaminophen (TYLENOL ARTHRITIS PAIN PO) Take 325 mg by mouth daily as needed (FOR MILD PAIN).    ALPRAZolam (XANAX) 0.5 MG tablet Take 1 tablet (0.5 mg total) by mouth 2 (two) times daily as needed for anxiety.    amitriptyline (ELAVIL) 50 MG tablet TAKE 1 TABLET(50 MG) BY MOUTH AT BEDTIME    amoxicillin-clavulanate (AUGMENTIN) 875-125 MG tablet Take 1 tablet by mouth 2 (two) times daily.    atorvastatin (LIPITOR) 20 MG tablet TAKE 1 TABLET(20 MG) BY MOUTH DAILY    benzonatate (TESSALON) 200 MG capsule Take 1 capsule (200 mg total) by mouth 3 (three) times daily as needed for cough.    digoxin (LANOXIN) 0.125 MG tablet Take 1 tablet (125 mcg total) by mouth daily. pls advise pt to make appt with provider for further refills    fludrocortisone (FLORINEF) 0.1 MG tablet TAKE 1 TABLET(0.1 MG) BY MOUTH DAILY    furosemide (LASIX) 20 MG tablet Take 1 tablet (20 mg total) by mouth daily.    hydrocortisone (CORTEF) 10 MG tablet Take 10 mg by mouth daily.    hydroxypropyl methylcellulose / hypromellose (ISOPTO TEARS / GONIOVISC) 2.5 % ophthalmic solution Place 2 drops into both eyes daily as needed for dry eyes.    levothyroxine (SYNTHROID) 50 MCG tablet Take 50 mcg by mouth every morning.    metoprolol succinate (TOPROL-XL) 25 MG 24 hr tablet TAKE 1 TABLET(25 MG) BY MOUTH DAILY    Multiple Vitamins-Minerals  (MULTIVITAMIN ADULT PO) Take 1 tablet by mouth daily. (Patient not taking: Reported on 02/01/2021)    potassium chloride SA (K-DUR,KLOR-CON) 20 MEQ tablet TAKE 1 TABLET(20 MEQ) BY MOUTH DAILY (Patient taking differently: Take 60 mEq by mouth daily.) 10/02/2019: 3 tablets daily    promethazine-dextromethorphan (PROMETHAZINE-DM) 6.25-15 MG/5ML syrup Take 5 mLs by mouth 4 (four) times daily as needed.    simethicone (MYLICON) 80 MG chewable tablet Chew 160 mg by mouth every 6 (six) hours as needed for flatulence.    No facility-administered encounter medications on file as of 03/10/2021.     Have you had any problems recently with your health? Patient states she has not had any problems recently with her health.  Have you had any problems with your pharmacy? Patient states she has not had any problems recently with her pharmacy.  What issues or side effects are you having with your medications? Patient states she is not currently having any issues or side effects from any of her medications.  What would you like me to pass along to Madelin Rear, CPP for him to help you with?  Patient states she does not have anything for me to pass along at this time.  What can we do to take care of you better? Patient did not have any suggestions.  Patient rescheduled her follow up telephone appointment with the clinical pharmacist on 03/17/2021 at 3:00 pm.  Future Appointments  Date Time Provider [place]  03/17/2021  3:00 PM [facility] PHARMACIST [facility]  10/18/2021  8:15 AM [facility] HEALTH COACH [facility]    Star Rating Drugs: Atorvastatin 01/16/2021 90 DS   April D Calhoun, [place] Pharmacist Assistant [phone]

## 2021-03-17 ENCOUNTER — Ambulatory Visit (INDEPENDENT_AMBULATORY_CARE_PROVIDER_SITE_OTHER): Payer: Medicare Other

## 2021-03-17 DIAGNOSIS — K219 Gastro-esophageal reflux disease without esophagitis: Secondary | ICD-10-CM

## 2021-03-17 DIAGNOSIS — I10 Essential (primary) hypertension: Secondary | ICD-10-CM

## 2021-03-17 NOTE — Patient Instructions (Signed)
Kaitlyn Adams,  Thank you for talking with me today. I have included our care plan/goals in the following pages.   Please review and call me at [phone] with any questions.  Thanks! Ellin Mayhew, PharmD, CPP Clinical Pharmacist Practitioner  [facility]  [phone]    Patient Care Plan: ccm pharmacy care plan     Problem Identified: CAD, HLD, HTN, IDA, Afib, adrenal insufficiency, GERD   Priority: High     Long-Range Goal: Disease Management   Start Date: 03/17/2021  Expected End Date: 03/17/2022  This Visit's Progress: On track  Priority: High  Note:    Current Barriers:  Intermittent acid reflux symptoms  Pharmacist Clinical Goal(s):  Patient will verbalize ability to afford treatment regimen contact provider office for questions/concerns as evidenced notation of same in electronic health record through collaboration with PharmD and provider.   Interventions: 1:1 collaboration with Midge Minium, MD regarding development and update of comprehensive plan of care as evidenced by provider attestation and co-signature Inter-disciplinary care team collaboration (see longitudinal plan of care) Comprehensive medication review performed; medication list updated in electronic medical record  Blood pressure  (Status:New goal.)   Med Management Intervention: Continued monitoring   (BP goal <130/80) -Controlled Now on long term corticosteroids due to adrenal sufficiency. Does note some weight gain over past several months. Continues to monitor BP and pulse at home - 110/65, 62 earlier today.  Current treatment: Furosemide 20 mg once daily  Metoprolol succinate 25 mg once daily  -Current exercise habits: Stays active at work, around the house, and with grandchildren.  -Denies hypotensive/hypertensive symptoms -Educated on BP goals and benefits of medications for prevention of heart attack, stroke and kidney damage; Importance of home blood  pressure monitoring; -Counseled to monitor BP at home 1-2x/week, document, and provide log at future appointments -Recommended to continue current medication  GERD (Goal: minimize symptoms) -Not ideally controlled -Has noticed some worsening reflux past several months. Has OTC Pepcid 10 mg - will take every few days. Will consider increasing frequency and understands potential stomach related risks of oral steroids.   -Current treatment  Famotidine 10 mg tablets - two tablets/day as needed  -Reviewed triggers including those possibly caused by medications -Recommended to continue current medication  Bone health -Reports upcoming DEXA with Dr Chalmers Cater  Patient Goals/Self-Care Activities Patient will:  - check blood pressure 1-2x/week, document, and provide at future appointments  Medication Assistance: None required.  Patient affirms current coverage meets needs.     The patient verbalized understanding of instructions provided today and agreed to receive a MyChart copy of patient instruction and/or educational materials. Telephone follow up appointment with pharmacy team member scheduled for: See next appointment with "Care Management Staff" under "What's Next" below.

## 2021-03-17 NOTE — Progress Notes (Signed)
Chronic Care Management Pharmacy Note  03/17/2021 Name:  Kaitlyn Adams MRN:  [id number] DOB:  09-11-1950  Recommendations/Changes made from today's visit: No Rx Changes.  Subjective: Kaitlyn Adams is an 70 y.o. year old female who is a primary patient of Tabori, Aundra Millet, MD.  The CCM team was consulted for assistance with disease management and care coordination needs.    Engaged with patient by telephone for follow up visit in response to provider referral for pharmacy case management and/or care coordination services.   Consent to Services:  The patient was given information about Chronic Care Management services, agreed to services, and gave verbal consent prior to initiation of services.  Please see initial visit note for detailed documentation.   Patient Care Team: Midge Minium, MD as PCP - General (Family Medicine) Martinique, Peter M, MD as PCP - Cardiology (Cardiology) Jacelyn Pi, MD as Consulting Physician (Endocrinology) Jola Schmidt, MD as Consulting Physician (Ophthalmology) [place], Peter M, MD as Consulting Physician (Cardiology) Tanda Rockers, MD as Consulting Physician (Pulmonary Disease) Madelin Rear, [facility] as Pharmacist (Pharmacist)  Objective:  Lab Results  Component Value Date   CREATININE 1.13 11/06/2020   CREATININE 1.18 05/13/2020   CREATININE 1.07 04/07/2020   Lab Results  Component Value Date   HGBA1C 5.6 05/14/2015   Last diabetic Eye exam: No results found for: HMDIABEYEEXA  Last diabetic Foot exam: No results found for: HMDIABFOOTEX      Component Value Date/Time   CHOL 183 11/06/2020 1105   TRIG 231.0 (H) 11/06/2020 1105   HDL 48.20 11/06/2020 1105   CHOLHDL 4 11/06/2020 1105   VLDL 46.2 (H) 11/06/2020 1105   LDLCALC 111 (H) 04/07/2020 0915   LDLDIRECT 98.0 11/06/2020 1105    Hepatic Function Latest Ref Rng & Units 11/06/2020 05/13/2020 04/07/2020  Total Protein 6.0 - 8.3 g/dL 7.9 6.9 7.0  Albumin 3.5 - 5.2 g/dL  4.4 4.4 4.5  AST 0 - 37 U/L _0 ALT 0 - 35 U/L 19 15 33  Alk Phosphatase 39 - 117 U/L 78 77 80  Total Bilirubin 0.2 - 1.2 mg/dL 0.6 0.6 0.5  Bilirubin, Direct 0.0 - 0.3 mg/dL 0.1 0.1 0.1    Lab Results  Component Value Date/Time   TSH 2.31 11/06/2020 11:05 AM   TSH 3.59 05/13/2020 01:31 PM   FREET4 1.20 11/05/2018 09:46 AM    CBC Latest Ref Rng & Units 11/06/2020 05/13/2020 04/07/2020  WBC 4.0 - 10.5 K/uL 7.7 9.0 7.5  Hemoglobin 12.0 - 15.0 g/dL 15.6(H) 14.8 15.1(H)  Hematocrit 36.0 - 46.0 % 46.2(H) 43.7 45.4  Platelets 150.0 - 400.0 K/uL 321.0 314.0 277.0    No results found for: VD25OH  Clinical ASCVD:  The 10-year ASCVD risk score Mikey Bussing DC Jr., et al., 2013) is: 12.3%   Values used to calculate the score:     Age: 74 years     Sex: Female     Is Non-Hispanic African American: No     Diabetic: No     Tobacco smoker: No     Systolic Blood Pressure: 416 mmHg     Is BP treated: Yes     HDL Cholesterol: 48.2 mg/dL     Total Cholesterol: 183 mg/dL    Social History   Tobacco Use  Smoking Status Never  Smokeless Tobacco Never   BP Readings from Last 3 Encounters:  11/06/20 117/80  10/12/20 108/73  07/06/20 115/60   Pulse Readings from Last  3 Encounters:  11/06/20 80  10/12/20 75  07/06/20 70   Wt Readings from Last 3 Encounters:  11/06/20 106 lb 12.8 oz (48.4 kg)  10/12/20 106 lb (48.1 kg)  07/06/20 109 lb 6.4 oz (49.6 kg)   Assessment: Review of patient past medical history, allergies, medications, health status, including review of consultants reports, laboratory and other test data, was performed as part of comprehensive evaluation and provision of chronic care management services.   SDOH:  (Social Determinants of Health) assessments and interventions performed: Yes   CCM Care Plan  Allergies  Allergen Reactions   Fenofibrate Other (See Comments)    GI side effects    Erythromycin Nausea And Vomiting    REACTION: nausea and vomiting    Guaifenesin Er Palpitations   Sulfonamide Derivatives Nausea And Vomiting    REACTION: nausea and vomiting    Medications Reviewed Today     Reviewed by Madelin Rear, [facility] (Pharmacist) on 03/17/21 at [place] List Status: <None>   Medication Order Taking? Sig Documenting Provider Last Dose Status Informant  Acetaminophen (TYLENOL ARTHRITIS PAIN PO) [id number]  Take 325 mg by mouth daily as needed (FOR MILD PAIN). [provider]  Active Self  ALPRAZolam Duanne Moron) 0.5 MG tablet [id number]  Take 1 tablet (0.5 mg total) by mouth 2 (two) times daily as needed for anxiety. Midge Minium, MD  Active Self  amitriptyline (ELAVIL) 50 MG tablet [id number]  TAKE 1 TABLET(50 MG) BY MOUTH AT BEDTIME Midge Minium, MD  Active   atorvastatin (LIPITOR) 20 MG tablet [id number]  TAKE 1 TABLET(20 MG) BY MOUTH DAILY Midge Minium, MD  Active   benzonatate (TESSALON) 200 MG capsule [id number]  Take 1 capsule (200 mg total) by mouth 3 (three) times daily as needed for cough. Midge Minium, MD  Active   digoxin (LANOXIN) 0.125 MG tablet [id number]  Take 1 tablet (125 mcg total) by mouth daily. pls advise pt to make appt with provider for further refills [place]ique, Peter M, MD  Active   famotidine (PEPCID) 10 MG tablet [id number] Yes Take 10 mg by mouth 2 (two) times daily. [provider] Taking Active Self  fludrocortisone (FLORINEF) 0.1 MG tablet [id number]  TAKE 1 TABLET(0.1 MG) BY MOUTH DAILY Midge Minium, MD  Active   furosemide (LASIX) 20 MG tablet [id number]  Take 1 tablet (20 mg total) by mouth daily. Midge Minium, MD  Active   hydrocortisone (CORTEF) 10 MG tablet [id number]  Take 10 mg by mouth daily. [provider]  Active   hydroxypropyl methylcellulose / hypromellose (ISOPTO TEARS / GONIOVISC) 2.5 % ophthalmic solution [id number]  Place 2 drops into both eyes daily as needed for dry eyes. [provider]  Active Self  levothyroxine (SYNTHROID) 50  MCG tablet [id number]  Take 50 mcg by mouth every morning. [provider]  Active   metoprolol succinate (TOPROL-XL) 25 MG 24 hr tablet [id number]  TAKE 1 TABLET(25 MG) BY MOUTH DAILY Midge Minium, MD  Active   potassium chloride SA (K-DUR,KLOR-CON) 20 MEQ tablet [id number]  TAKE 1 TABLET(20 MEQ) BY MOUTH DAILY  Patient taking differently: Take 60 mEq by mouth daily.   Midge Minium, MD  Active            Med Note Javier Glazier, [place]ank   Wed Oct 02, 2019  8:45 AM) 3 tablets daily   simethicone (MYLICON) 80 MG chewable tablet [id number]  Chew 160 mg by mouth every 6 (  six) hours as needed for flatulence. 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   Vitamin D deficiency 11/06/2020   Hypokalemia 10/25/2018   Moderate protein-calorie malnutrition ([place]) 08/01/2018   Upper airway cough syndrome 05/22/2017   Symptomatic anemia 04/26/2017   Chronic constipation 07/07/2016   History of colonic polyps 07/07/2016   Chronic anticoagulation 07/07/2016   Iron deficiency anemia 06/15/2016   Anxiety and depression 05/18/2016   Physical exam 11/18/2015   Pain in joint, ankle and foot 11/16/2015   PAT (paroxysmal atrial tachycardia) ([place]) 09/08/2015   Hyperlipidemia 07/30/2015   CAD (coronary artery disease)    Chest pain at rest, not due to CAD, possible due to a fib. 05/15/2015   Chronic bronchitis ([place]) 06/17/2011   Orthostatic hypotension 12/06/2010   Paroxysmal atrial fibrillation (HCC)    Renal insufficiency    Myalgia    Hypothyroidism    Glaucoma    GERD, SEVERE 08/19/2010   Osteoarthritis 04/17/2008   FIBROMYALGIA 04/17/2008    Immunization History  Administered Date(s) Administered   Fluad Quad(high Dose 65+) 10/02/2019   Influenza Whole 06/22/2009   Influenza, High Dose Seasonal PF  07/06/2017, 08/01/2018   PFIZER(Purple Top)SARS-COV-2 Vaccination 10/18/2019, 11/13/2019, 05/28/2020   Pneumococcal Conjugate-13 11/18/2015   Pneumococcal Polysaccharide-23 08/23/2003, 07/06/2017   Tdap 07/10/2012    Conditions to be addressed/monitored: CAD, HLD, HTN, IDA, Afib, adrenal insufficiency, GERD  Care Plan : ccm pharmacy care plan  Updates made by Madelin Rear, [facility] since 03/17/2021 12:00 AM     Problem: CAD, HLD, HTN, IDA, Afib, adrenal insufficiency, GERD   Priority: High     Long-Range Goal: Disease Management   Start Date: 03/17/2021  Expected End Date: 03/17/2022  This Visit's Progress: On track  Priority: High  Note:    Current Barriers:  Intermittent acid reflux symptoms  Pharmacist Clinical Goal(s):  Patient will verbalize ability to afford treatment regimen contact provider office for questions/concerns as evidenced notation of same in electronic health record through collaboration with PharmD and provider.   Interventions: 1:1 collaboration with Midge Minium, MD regarding development and update of comprehensive plan of care as evidenced by provider attestation and co-signature Inter-disciplinary care team collaboration (see longitudinal plan of care) Comprehensive medication review performed; medication list updated in electronic medical record  Blood pressure  (Status:New goal.)   Med Management Intervention: Continued monitoring   (BP goal <130/80) -Controlled Now on long term corticosteroids due to adrenal sufficiency. Does note some weight gain over past several months. Continues to monitor BP and pulse at home - 110/65, 62 earlier today.  Current treatment: Furosemide 20 mg once daily  Metoprolol succinate 25 mg once daily  -Current exercise habits: Stays active at work, around the house, and with grandchildren.  -Denies hypotensive/hypertensive symptoms -Educated on BP goals and benefits of medications for prevention of heart attack,  stroke and kidney damage; Importance of home blood pressure monitoring; -Counseled to monitor BP at home 1-2x/week, document, and provide log at future appointments -Recommended to continue current medication  GERD (Goal: minimize symptoms) -Not ideally controlled -Has noticed some worsening reflux past several months. Has OTC Pepcid 10 mg - will take every few days. Will consider increasing frequency and understands potential stomach related risks of oral steroids.   -Current treatment  Famotidine 10 mg tablets - two tablets/day as needed  -Reviewed triggers including those possibly caused by medications -Recommended to continue current medication  Bone health -Reports upcoming DEXA with Dr Chalmers Cater  Patient Goals/Self-Care Activities Patient will:  - check blood pressure 1-2x/week, document, and provide at future appointments  Medication Assistance: None required.  Patient affirms current coverage meets needs.     Patient's preferred pharmacy is:  [facility] [place], [address] [place] [address] Phone: [phone] Fax: [phone]  Follow Up:  Patient agrees to Care Plan and Follow-up.  Plan:  CPA BP call 3 months. [place] f/u call six months  Future Appointments  Date Time Provider [place]  09/15/2021  1:00 PM [facility] CCM PHARMACIST [facility]  10/18/2021  8:15 AM [facility] HEALTH COACH [facility] [place], PharmD, CPP Clinical Pharmacist Practitioner  [facility]  [phone]

## 2021-03-23 DIAGNOSIS — M8589 Other specified disorders of bone density and structure, multiple sites: Secondary | ICD-10-CM | POA: Diagnosis not present

## 2021-03-23 DIAGNOSIS — E559 Vitamin D deficiency, unspecified: Secondary | ICD-10-CM | POA: Diagnosis not present

## 2021-04-16 ENCOUNTER — Other Ambulatory Visit: Payer: Self-pay | Admitting: Family Medicine

## 2021-04-22 ENCOUNTER — Telehealth: Payer: Self-pay

## 2021-04-22 NOTE — Progress Notes (Signed)
Chronic Care Management Pharmacy Assistant   Name: Kaitlyn Adams  MRN: [id number] DOB: 24-Jun-1951   Reason for Encounter: Disease State - General Adherence Call    Recent office visits:  None noted.  Recent consult visits:  None noted.   Hospital visits:  None in previous 6 months  Medications: Outpatient Encounter Medications as of 04/22/2021  Medication Sig Note   Acetaminophen (TYLENOL ARTHRITIS PAIN PO) Take 325 mg by mouth daily as needed (FOR MILD PAIN).    ALPRAZolam (XANAX) 0.5 MG tablet Take 1 tablet (0.5 mg total) by mouth 2 (two) times daily as needed for anxiety.    amitriptyline (ELAVIL) 50 MG tablet TAKE 1 TABLET(50 MG) BY MOUTH AT BEDTIME    atorvastatin (LIPITOR) 20 MG tablet TAKE 1 TABLET(20 MG) BY MOUTH DAILY    benzonatate (TESSALON) 200 MG capsule Take 1 capsule (200 mg total) by mouth 3 (three) times daily as needed for cough.    digoxin (LANOXIN) 0.125 MG tablet Take 1 tablet (125 mcg total) by mouth daily. pls advise pt to make appt with provider for further refills    famotidine (PEPCID) 10 MG tablet Take 10 mg by mouth 2 (two) times daily.    fludrocortisone (FLORINEF) 0.1 MG tablet TAKE 1 TABLET(0.1 MG) BY MOUTH DAILY    furosemide (LASIX) 20 MG tablet Take 1 tablet (20 mg total) by mouth daily.    hydrocortisone (CORTEF) 10 MG tablet Take 10 mg by mouth daily.    hydroxypropyl methylcellulose / hypromellose (ISOPTO TEARS / GONIOVISC) 2.5 % ophthalmic solution Place 2 drops into both eyes daily as needed for dry eyes.    levothyroxine (SYNTHROID) 50 MCG tablet Take 50 mcg by mouth every morning.    metoprolol succinate (TOPROL-XL) 25 MG 24 hr tablet TAKE 1 TABLET(25 MG) BY MOUTH DAILY    potassium chloride SA (K-DUR,KLOR-CON) 20 MEQ tablet TAKE 1 TABLET(20 MEQ) BY MOUTH DAILY (Patient taking differently: Take 60 mEq by mouth daily.) 10/02/2019: 3 tablets daily    simethicone (MYLICON) 80 MG chewable tablet Chew 160 mg by mouth every 6 (six) hours as  needed for flatulence.    No facility-administered encounter medications on file as of 04/22/2021.   Have you had any problems recently with your health? Patient has been having a flare of [place] and Fibromyalgia. Her husband has recently had surgery and she is currently taking care of him.   Have you had any problems with your pharmacy? Patient denied any problems with her current pharmacy.   What issues or side effects are you having with your medications? Patient denied any current issues or side effects with her current medications.   What would you like me to pass along to Madelin Rear, CPP for them to help you with?  Patient did not have anything to pass along to CPP at this time.   What can we do to take care of you better? Patient did not have any recommendations at this time. She is currently satisfied with her current level of care.   Star Rating Drugs: Atorvastatin (LIPITOR) 20 MG tablet - last filled 04/16/21 90 days   Care Gaps  AWV: done 10/12/20 Colonoscopy: due 11/06/21 DM Eye Exam: N/A DEXA: overdue Mammogram: due 10/07/21   Future Appointments  Date Time Provider [place]  07/01/2021 11:15 AM Warren Lacy, PA-C [facility]  09/15/2021  1:00 PM [facility] CCM PHARMACIST [facility]  10/18/2021  8:15 AM [facility] HEALTH COACH [facility]  Jobe Gibbon, [place] Pharmacist Assistant  [phone]  Time Spent:  35 minutes

## 2021-04-27 ENCOUNTER — Other Ambulatory Visit: Payer: Self-pay | Admitting: Family Medicine

## 2021-05-16 ENCOUNTER — Other Ambulatory Visit: Payer: Self-pay | Admitting: Family Medicine

## 2021-05-16 DIAGNOSIS — K5909 Other constipation: Secondary | ICD-10-CM

## 2021-06-15 ENCOUNTER — Other Ambulatory Visit: Payer: Self-pay | Admitting: Cardiology

## 2021-06-30 NOTE — Progress Notes (Signed)
Cardiology Office Note:    Date:  07/01/2021   ID:  [name], Such 1951-07-22, MRN [id number]  PCP:  Midge Minium, MD Secretary Cardiologist: Peter Martinique, MD   Reason for visit: 1 year follow-up  History of Present Illness:    Kaitlyn Adams is a 70 y.o. female with a hx of paroxysmal atrial fibrillation, PAT, HTN, HLD, hyperthyroidism s/p thyroidectomy with subsequent hypothyroidism, asthma, GERD, fibromyalgia, mild nonobstructive CAD by [facility] 2016 and syncope.      She last saw Dr. Martinique in November 2021.  States she is doing well from a cardiovascular standpoint.  She mentions chronic dyspnea with exertion that is unchanged.  She had 1 episode of chest pain when she was sick with a cough.  No recurrence.  She states she walks her dog and stays busy.  She has occasional palpitations that may last up to 30 minutes.  She notes these palpitations are worse when she is stressed mentally or physically.  She has been out of Toprol for 1 month secondary to pharmacy not refilling.  She has noticed that palpitations may be more frequent off Toprol.  She has lightheadedness with quick position changes.  She mentions 1 episode of syncope in September.  She said everything checked out okay.  She was diagnosed with Addison's disease this year.  She is taking Florinef and hydrocortisone.   Past Medical History:  Diagnosis Date   Anemia    Asthma    - normal spirometry 2009 FEV1  >90% predicted.  - methacoline challenge neg 11/09   Bronchitis    recurrent   Cataract    Chronic cough    - sinus CT 03-23-10 > neg.  - allergy profile 03-23-10 > nl, IgE 14.  - flutter valve rx 03-23-10   Fibromyalgia    GERD (gastroesophageal reflux disease)    exacerbating VCD   Glaucoma    Hyperlipidemia    Hypertension    Hyperthyroidism    with hot nodule.  - total thyroidectomy 11-05-08 benign -> subsequent hypothyroidism.   Hypokalemia    Hypothyroidism    Hypothyroidism    a.  following thyroidectomy.   Mild CAD    a. LHC 04/2015 - mild nonobstructive CAD with 30% dLAD, 40% D1, 25% pLCx, 30% mRCA, 30% RPDA, normal EF >65% with normal LVEDP.   Orthostatic hypotension    Osteoarthritis    Paroxysmal atrial fibrillation (HCC)    Renal insufficiency    Vasomotor rhinitis    exacerbates VCD   Vocal cord dysfunction    proven on FOB 9/09    Past Surgical History:  Procedure Laterality Date   ABDOMINAL HYSTERECTOMY  1980   ABDOMINAL HYSTERECTOMY     BASAL CELL CARCINOMA EXCISION  11/08   CARDIAC CATHETERIZATION N/A 05/15/2015   Procedure: Left Heart Cath and Coronary Angiography;  Surgeon: Sherren Mocha, MD;  Location: [place] [facility];  Service: Cardiovascular;  Laterality: N/A;   COLONOSCOPY N/A 04/27/2017   Dr Silverio Decamp for scant rectal bleeding and iron def anemia: Melanosis coli, lipomatous IC valve, left sided tics, non-bleeding hemorrhoid, 10 mm transverse polyp.     ESOPHAGOGASTRODUODENOSCOPY N/A 04/27/2017   Mauri Pole, MD; [facility] ENDOSCOPY. for iron def anemia.  Monilial/candidial esophagitis. Small HH.     EYE SURGERY     scar tissue removal  1982   THYROID SURGERY  2010   THYROIDECTOMY  11-05-08    Current Medications: Current Meds  Medication Sig  Acetaminophen (TYLENOL ARTHRITIS PAIN PO) Take 325 mg by mouth daily as needed (FOR MILD PAIN).   ALPRAZolam (XANAX) 0.5 MG tablet Take 1 tablet (0.5 mg total) by mouth 2 (two) times daily as needed for anxiety.   amitriptyline (ELAVIL) 50 MG tablet TAKE 1 TABLET(50 MG) BY MOUTH AT BEDTIME   atorvastatin (LIPITOR) 20 MG tablet TAKE 1 TABLET(20 MG) BY MOUTH DAILY   benzonatate (TESSALON) 200 MG capsule Take 1 capsule (200 mg total) by mouth 3 (three) times daily as needed for cough.   digoxin (LANOXIN) 0.125 MG tablet TAKE 1 TABLET( 125 MCG TOTAL) BY MOUTH DAILY   famotidine (PEPCID) 10 MG tablet Take 10 mg by mouth 2 (two) times daily.   fludrocortisone (FLORINEF) 0.1 MG tablet TAKE 1 TABLET(0.1 MG)  BY MOUTH DAILY   hydrocortisone (CORTEF) 10 MG tablet Take 10 mg by mouth daily.   hydroxypropyl methylcellulose / hypromellose (ISOPTO TEARS / GONIOVISC) 2.5 % ophthalmic solution Place 2 drops into both eyes daily as needed for dry eyes.   levothyroxine (SYNTHROID) 50 MCG tablet Take 50 mcg by mouth every morning.   potassium chloride SA (K-DUR,KLOR-CON) 20 MEQ tablet TAKE 1 TABLET(20 MEQ) BY MOUTH DAILY (Patient taking differently: Take 60 mEq by mouth daily.)   simethicone (MYLICON) 80 MG chewable tablet Chew 160 mg by mouth every 6 (six) hours as needed for flatulence.   [DISCONTINUED] furosemide (LASIX) 20 MG tablet TAKE 1 TABLET(20 MG) BY MOUTH DAILY   [DISCONTINUED] metoprolol succinate (TOPROL-XL) 25 MG 24 hr tablet TAKE 1 TABLET(25 MG) BY MOUTH DAILY     Allergies:   Fenofibrate, Erythromycin, Guaifenesin er, and Sulfonamide derivatives   Social History   Socioeconomic History   Marital status: Married    Spouse name: Not on file   Number of children: 1   Years of education: Not on file   Highest education level: Not on file  Occupational History   Occupation: owns a plumbing business   Occupation: OWNER    Employer: [employer] Use   Smoking status: Never   Smokeless tobacco: Never  Vaping Use   Vaping Use: Never used  Substance and Sexual Activity   Alcohol use: Yes    Alcohol/week: 1.0 standard drink    Types: 1 Glasses of wine per week    Comment: occasional wine or beer   Drug use: No   Sexual activity: Yes    Birth control/protection: None  Other Topics Concern   Not on file  Social History Narrative   Not on file   Social Determinants of Health   Financial Resource Strain: Low Risk    Difficulty of Paying Living Expenses: Not hard at all  Food Insecurity: No Food Insecurity   Worried About Charity fundraiser in the Last Year: Never true   [place] in the Last Year: Never true  Transportation Needs: No Transportation Needs    Lack of Transportation (Medical): No   Lack of Transportation (Non-Medical): No  Physical Activity: Not on file  Stress: No Stress Concern Present   Feeling of Stress : Only a little  Social Connections: Moderately Isolated   Frequency of Communication with Friends and Family: More than three times a week   Frequency of Social Gatherings with Friends and Family: More than three times a week   Attends Religious Services: Never   Marine scientist or Organizations: No   Attends Archivist Meetings: Never  Marital Status: Married     Family History: The patient's family history includes Asthma in her grandchild and mother; Emphysema in her father; Heart disease in her father and mother; Irregular heart beat in her brother.  ROS:   Please see the history of present illness.     EKGs/Labs/Other Studies Reviewed:    EKG:  The ekg ordered today demonstrates normal sinus rhythm, right bundle branch block, heart rate 85, PR interval 158 ms, QRS duration 126 ms.  Recent Labs: 11/06/2020: ALT 19; BUN 11; Creatinine, Ser 1.13; Hemoglobin 15.6; Platelets 321.0; Potassium 3.5; Sodium 140; TSH 2.31   Recent Lipid Panel Lab Results  Component Value Date/Time   CHOL 183 11/06/2020 11:05 AM   TRIG 231.0 (H) 11/06/2020 11:05 AM   HDL 48.20 11/06/2020 11:05 AM   LDLCALC 111 (H) 04/07/2020 09:15 AM   LDLDIRECT 98.0 11/06/2020 11:05 AM    Physical Exam:    VS:  BP 111/68   Pulse 85   Ht 5\' 3"  (1.6 m)   Wt 110 lb 6.4 oz (50.1 kg)   SpO2 98%   BMI 19.56 kg/m    No data found.  Wt Readings from Last 3 Encounters:  07/01/21 110 lb 6.4 oz (50.1 kg)  11/06/20 106 lb 12.8 oz (48.4 kg)  10/12/20 106 lb (48.1 kg)     GEN:  Well nourished, well developed in no acute distress HEENT: Normal NECK: No JVD; No carotid bruits CARDIAC: RRR, no murmurs, rubs, gallops RESPIRATORY:  Clear to auscultation without rales, wheezing or rhonchi  ABDOMEN: Soft, non-tender,  non-distended MUSCULOSKELETAL: No edema; No deformity  SKIN: Warm and dry NEUROLOGIC:  Alert and oriented PSYCHIATRIC:  Normal affect     ASSESSMENT AND PLAN   PAT/Paroxsymal A-fib -Pafib noted in 2016. Mostly PAT though. Not on anticoagulation given she has only had one documented episode of AFib and has a history of major GI bleed and falls.  -NSR today.  -Refill Toprol. -If syncope recurrent, recommend Zio patch vs. loop recorder.  Hx of Syncope  -Previously related to post trauma and not eating. Cardiac evaluation unremarkable. -Recommend changing Lasix to as needed. -Recommend compression stockings.  Hyperlipidemia -LDL 111 in August 2021.  She is to have her lipids rechecked with her primary care. -If her LDL remains over 70, would recommend titrating Lipitor dose. -Discussed cholesterol lowering diets - Mediterranean diet, DASH diet, vegetarian diet, low-carbohydrate diet and avoidance of trans fats.  Discussed healthier choice substitutes.  Nuts, high-fiber foods, and fiber supplements may also improve lipids. -Pt unable to tolerate fenofibrate due to GI effects in the past.   Mild CAD  -LHC 2016: 30% dLAD, 40% D1, 25% pLCx, 30% mRCA, 30% RPDA, normal EF >65%  -EKG with known right bundle branch block, no ischemic changes. -Continue risk reduction -check lipids. -Continue Toprol & statin therapy.  Disposition - Follow-up in 1 year with Dr. Martinique.   Medication Adjustments/Labs and Tests Ordered: Current medicines are reviewed at length with the patient today.  Concerns regarding medicines are outlined above.  Orders Placed This Encounter  Procedures   EKG 12-Lead   Meds ordered this encounter  Medications   metoprolol succinate (TOPROL-XL) 25 MG 24 hr tablet    Sig: TAKE 1 TABLET(25 MG) BY MOUTH DAILY    Dispense:  90 tablet    Refill:  3   furosemide (LASIX) 20 MG tablet    Sig: Take 1 tablet (20 mg total) by mouth as needed. For Leg swelling  Dispense:  90  tablet    Refill:  0    Patient Instructions  Medication Instructions:  Lasix 20 mg ( Take 1 Tablet As Needed For Leg Swelling). *If you need a refill on your cardiac medications before your next appointment, please call your pharmacy*   Lab Work: No Labs If you have labs (blood work) drawn today and your tests are completely normal, you will receive your results only by: [place] (if you have MyChart) OR A paper copy in the mail If you have any lab test that is abnormal or we need to change your treatment, we will call you to review the results.   Testing/Procedures: No Testing    Follow-Up: At [facility], you and your health needs are our priority.  As part of our continuing mission to provide you with exceptional heart care, we have created designated Provider Care Teams.  These Care Teams include your primary Cardiologist (physician) and Advanced Practice Providers (APPs -  Physician Assistants and Nurse Practitioners) who all work together to provide you with the care you need, when you need it.  We recommend signing up for the patient portal called "MyChart".  Sign up information is provided on this After Visit Summary.  MyChart is used to connect with patients for Virtual Visits (Telemedicine).  Patients are able to view lab/test results, encounter notes, upcoming appointments, etc.  Non-urgent messages can be sent to your provider as well.   To learn more about what you can do with MyChart, go to [email or web address].    Your next appointment:   1 year(s)  The format for your next appointment:   In Person  Provider:   Peter Martinique, MD     Other Instructions Recommend Fasting Lipid to be done with Primary Care. Recommend Compression Stocking.    Signed, Warren Lacy, PA-C  07/01/2021 11:55 AM    [facility]

## 2021-07-01 ENCOUNTER — Other Ambulatory Visit: Payer: Self-pay

## 2021-07-01 ENCOUNTER — Ambulatory Visit (INDEPENDENT_AMBULATORY_CARE_PROVIDER_SITE_OTHER): Payer: Medicare Other | Admitting: Physician Assistant

## 2021-07-01 ENCOUNTER — Encounter: Payer: Self-pay | Admitting: Physician Assistant

## 2021-07-01 VITALS — BP 111/68 | HR 85 | Ht 63.0 in | Wt 110.4 lb

## 2021-07-01 DIAGNOSIS — R55 Syncope and collapse: Secondary | ICD-10-CM

## 2021-07-01 DIAGNOSIS — I2510 Atherosclerotic heart disease of native coronary artery without angina pectoris: Secondary | ICD-10-CM | POA: Diagnosis not present

## 2021-07-01 DIAGNOSIS — E785 Hyperlipidemia, unspecified: Secondary | ICD-10-CM | POA: Diagnosis not present

## 2021-07-01 DIAGNOSIS — I480 Paroxysmal atrial fibrillation: Secondary | ICD-10-CM | POA: Diagnosis not present

## 2021-07-01 DIAGNOSIS — I471 Supraventricular tachycardia: Secondary | ICD-10-CM

## 2021-07-01 MED ORDER — FUROSEMIDE 20 MG PO TABS: 20.0000 mg | ORAL_TABLET | ORAL | 0 refills | Status: DC | PRN

## 2021-07-01 MED ORDER — METOPROLOL SUCCINATE ER 25 MG PO TB24: ORAL_TABLET | ORAL | 3 refills | Status: DC

## 2021-07-01 NOTE — Patient Instructions (Signed)
Medication Instructions:  Lasix 20 mg ( Take 1 Tablet As Needed For Leg Swelling). *If you need a refill on your cardiac medications before your next appointment, please call your pharmacy*   Lab Work: No Labs If you have labs (blood work) drawn today and your tests are completely normal, you will receive your results only by: [place] (if you have MyChart) OR A paper copy in the mail If you have any lab test that is abnormal or we need to change your treatment, we will call you to review the results.   Testing/Procedures: No Testing    Follow-Up: At [facility], you and your health needs are our priority.  As part of our continuing mission to provide you with exceptional heart care, we have created designated Provider Care Teams.  These Care Teams include your primary Cardiologist (physician) and Advanced Practice Providers (APPs -  Physician Assistants and Nurse Practitioners) who all work together to provide you with the care you need, when you need it.  We recommend signing up for the patient portal called "MyChart".  Sign up information is provided on this After Visit Summary.  MyChart is used to connect with patients for Virtual Visits (Telemedicine).  Patients are able to view lab/test results, encounter notes, upcoming appointments, etc.  Non-urgent messages can be sent to your provider as well.   To learn more about what you can do with MyChart, go to [email or web address].    Your next appointment:   1 year(s)  The format for your next appointment:   In Person  Provider:   Peter Martinique, MD     Other Instructions Recommend Fasting Lipid to be done with Primary Care. Recommend Compression Stocking.

## 2021-07-07 DIAGNOSIS — Z23 Encounter for immunization: Secondary | ICD-10-CM | POA: Diagnosis not present

## 2021-07-09 ENCOUNTER — Ambulatory Visit (INDEPENDENT_AMBULATORY_CARE_PROVIDER_SITE_OTHER): Payer: Medicare Other | Admitting: Family Medicine

## 2021-07-09 ENCOUNTER — Encounter: Payer: Self-pay | Admitting: Family Medicine

## 2021-07-09 VITALS — BP 104/72 | HR 76 | Temp 98.7°F | Resp 16 | Wt 110.8 lb

## 2021-07-09 DIAGNOSIS — I2510 Atherosclerotic heart disease of native coronary artery without angina pectoris: Secondary | ICD-10-CM

## 2021-07-09 DIAGNOSIS — M545 Low back pain, unspecified: Secondary | ICD-10-CM

## 2021-07-09 MED ORDER — TIZANIDINE HCL 4 MG PO TABS: 4.0000 mg | ORAL_TABLET | Freq: Three times a day (TID) | ORAL | 0 refills | Status: DC | PRN

## 2021-07-09 MED ORDER — HYDROCODONE-ACETAMINOPHEN 5-325 MG PO TABS: 1.0000 | ORAL_TABLET | Freq: Four times a day (QID) | ORAL | 0 refills | Status: DC | PRN

## 2021-07-09 NOTE — Patient Instructions (Signed)
Follow up as needed or as scheduled START the Tizanidine every 8 hrs for spasm USE the Hydrocodone every 6-8 hrs as needed for pain USE a heating pad APPLY Voltaren gel or BenGay or other muscle cream to help w/ pain Call with any questions or concerns Hang in there!

## 2021-07-09 NOTE — Progress Notes (Signed)
   Subjective:    Patient ID: Kaitlyn Adams, female    DOB: 1950-12-05, 70 y.o.   MRN: [id number]  HPI Back pain- sxs started on Wednesday.  Pt has been having to lift and pull husband after his knee replacement in September- he weighs 230 lbs.  Yesterday pt was in tears.  Pt reports pain is in lower back and radiates 'all the way across'.  Is not radiating down legs.  Pain is worse w/ certain movements or taking a deep breath.  On Wednesday she was running errands and getting in and out of car when sxs started.  Has taken ES Tylenol.  Pain is a stabbing type pain.     Review of Systems For ROS see HPI  This visit occurred during the SARS-CoV-2 public health emergency.  Safety protocols were in place, including screening questions prior to the visit, additional usage of staff PPE, and extensive cleaning of exam room while observing appropriate contact time as indicated for disinfecting solutions.      Objective:   Physical Exam Vitals reviewed.  Constitutional:      General: She is not in acute distress.    Appearance: Normal appearance. She is not ill-appearing.  HENT:     Head: Normocephalic and atraumatic.  Cardiovascular:     Pulses: Normal pulses.  Musculoskeletal:        General: Tenderness (TTP over bilateral lumbar paraspinal muscles w/ obvious spasm) present.  Skin:    General: Skin is warm and dry.  Neurological:     General: No focal deficit present.     Mental Status: She is alert and oriented to person, place, and time.     Cranial Nerves: No cranial nerve deficit.     Motor: No weakness.     Gait: Gait normal.     Comments: (-) SLR bilaterally  Psychiatric:        Mood and Affect: Mood normal.        Behavior: Behavior normal.        Thought Content: Thought content normal.          Assessment & Plan:   LBP- new.  Sxs started Wednesday.  Some improvement w/ Tylenol but continues to have pain w/ certain movements or deep breaths.  Since she has adrenal  insufficiency, will not start Prednisone taper but will use muscle relaxers and pain medications as well as heat and topical NSAIDs to improve pain.  Reviewed supportive care and red flags that should prompt return.  Pt expressed understanding and is in agreement w/ plan.

## 2021-07-29 ENCOUNTER — Other Ambulatory Visit: Payer: Self-pay | Admitting: Family Medicine

## 2021-08-14 ENCOUNTER — Other Ambulatory Visit: Payer: Self-pay | Admitting: Family Medicine

## 2021-08-14 DIAGNOSIS — K5909 Other constipation: Secondary | ICD-10-CM

## 2021-09-01 NOTE — Progress Notes (Signed)
Chronic Care Management Pharmacy Note  09/15/2021 Name:  Kaitlyn Adams MRN:  [id number] DOB:  04-28-1951  Recommendations/Changes made from today's visit: No Rx Changes.  Subjective: Kaitlyn Adams is an 71 y.o. year old female who is a primary patient of Tabori, Aundra Millet, MD.  The CCM team was consulted for assistance with disease management and care coordination needs.    Engaged with patient by telephone for follow up visit in response to provider referral for pharmacy case management and/or care coordination services.   Consent to Services:  The patient was given information about Chronic Care Management services, agreed to services, and gave verbal consent prior to initiation of services.  Please see initial visit note for detailed documentation.   Patient Care Team: Midge Minium, MD as PCP - General (Family Medicine) [place]ique, Peter M, MD as PCP - Cardiology (Cardiology) Jacelyn Pi, MD as Consulting Physician (Endocrinology) Jola Schmidt, MD as Consulting Physician (Ophthalmology) [place], Peter M, MD as Consulting Physician (Cardiology) Tanda Rockers, MD as Consulting Physician (Pulmonary Disease) Edythe Clarity, [facility] (Pharmacist)  Recent office visits:  None noted.   Recent consult visits:  None noted.    Hospital visits:  None in previous 6 months  Objective:  Lab Results  Component Value Date   CREATININE 1.13 11/06/2020   CREATININE 1.18 05/13/2020   CREATININE 1.07 04/07/2020   Lab Results  Component Value Date   HGBA1C 5.6 05/14/2015   Last diabetic Eye exam: No results found for: HMDIABEYEEXA  Last diabetic Foot exam: No results found for: HMDIABFOOTEX      Component Value Date/Time   CHOL 183 11/06/2020 1105   TRIG 231.0 (H) 11/06/2020 1105   HDL 48.20 11/06/2020 1105   CHOLHDL 4 11/06/2020 1105   VLDL 46.2 (H) 11/06/2020 1105   LDLCALC 111 (H) 04/07/2020 0915   LDLDIRECT 98.0 11/06/2020 1105    Hepatic Function  Latest Ref Rng & Units 11/06/2020 05/13/2020 04/07/2020  Total Protein 6.0 - 8.3 g/dL 7.9 6.9 7.0  Albumin 3.5 - 5.2 g/dL 4.4 4.4 4.5  AST 0 - 37 U/L _0 ALT 0 - 35 U/L 19 15 33  Alk Phosphatase 39 - 117 U/L 78 77 80  Total Bilirubin 0.2 - 1.2 mg/dL 0.6 0.6 0.5  Bilirubin, Direct 0.0 - 0.3 mg/dL 0.1 0.1 0.1    Lab Results  Component Value Date/Time   TSH 2.31 11/06/2020 11:05 AM   TSH 3.59 05/13/2020 01:31 PM   FREET4 1.20 11/05/2018 09:46 AM    CBC Latest Ref Rng & Units 11/06/2020 05/13/2020 04/07/2020  WBC 4.0 - 10.5 K/uL 7.7 9.0 7.5  Hemoglobin 12.0 - 15.0 g/dL 15.6(H) 14.8 15.1(H)  Hematocrit 36.0 - 46.0 % 46.2(H) 43.7 45.4  Platelets 150.0 - 400.0 K/uL 321.0 314.0 277.0    No results found for: VD25OH  Clinical ASCVD:  The 10-year ASCVD risk score (Arnett DK, et al., 2019) is: 10.4%   Values used to calculate the score:     Age: 74 years     Sex: Female     Is Non-Hispanic African American: No     Diabetic: No     Tobacco smoker: No     Systolic Blood Pressure: 119 mmHg     Is BP treated: Yes     HDL Cholesterol: 48.2 mg/dL     Total Cholesterol: 183 mg/dL    Social History   Tobacco Use  Smoking Status Never  Smokeless Tobacco  Never   BP Readings from Last 3 Encounters:  07/09/21 104/72  07/01/21 111/68  11/06/20 117/80   Pulse Readings from Last 3 Encounters:  07/09/21 76  07/01/21 85  11/06/20 80   Wt Readings from Last 3 Encounters:  07/09/21 110 lb 12.8 oz (50.3 kg)  07/01/21 110 lb 6.4 oz (50.1 kg)  11/06/20 106 lb 12.8 oz (48.4 kg)   Assessment: Review of patient past medical history, allergies, medications, health status, including review of consultants reports, laboratory and other test data, was performed as part of comprehensive evaluation and provision of chronic care management services.   SDOH:  (Social Determinants of Health) assessments and interventions performed: Yes   CCM Care Plan  Allergies  Allergen Reactions    Fenofibrate Other (See Comments)    GI side effects    Erythromycin Nausea And Vomiting    REACTION: nausea and vomiting   Guaifenesin Er Palpitations   Sulfonamide Derivatives Nausea And Vomiting    REACTION: nausea and vomiting    Medications Reviewed Today     Reviewed by Edythe Clarity, [facility] (Pharmacist) on 09/15/21 at [place] List Status: <None>   Medication Order Taking? Sig Documenting Provider Last Dose Status Informant  Acetaminophen (TYLENOL ARTHRITIS PAIN PO) [id number] Yes Take 325 mg by mouth daily as needed (FOR MILD PAIN). [provider] Taking Active Self  ALPRAZolam (XANAX) 0.5 MG tablet [id number]  Take 1 tablet (0.5 mg total) by mouth 2 (two) times daily as needed for anxiety. Midge Minium, MD  Active Self  amitriptyline (ELAVIL) 50 MG tablet [id number] Yes TAKE 1 TABLET(50 MG) BY MOUTH AT BEDTIME Midge Minium, MD Taking Active   atorvastatin (LIPITOR) 20 MG tablet [id number] Yes TAKE 1 TABLET(20 MG) BY MOUTH DAILY Midge Minium, MD Taking Active   benzonatate (TESSALON) 200 MG capsule [id number] Yes Take 1 capsule (200 mg total) by mouth 3 (three) times daily as needed for cough. Midge Minium, MD Taking Active   digoxin (LANOXIN) 0.125 MG tablet [id number] Yes TAKE 1 TABLET( 125 MCG TOTAL) BY MOUTH DAILY [place]ique, Peter M, MD Taking Active   famotidine (PEPCID) 10 MG tablet [id number] No Take 10 mg by mouth 2 (two) times daily.  Patient not taking: Reported on 09/15/2021   [provider] Not Taking Active Self  fludrocortisone (FLORINEF) 0.1 MG tablet [id number] Yes TAKE 1 TABLET(0.1 MG) BY MOUTH DAILY Midge Minium, MD Taking Active   furosemide (LASIX) 20 MG tablet [id number] Yes Take 1 tablet (20 mg total) by mouth as needed. For Leg swelling Warren Lacy, PA-C Taking Active   HYDROcodone-acetaminophen (NORCO/VICODIN) 5-325 MG tablet [id number] Yes Take 1 tablet by mouth every 6 (six) hours as needed for moderate  pain. Midge Minium, MD Taking Active   hydrocortisone (CORTEF) 10 MG tablet [id number] Yes Take 10 mg by mouth daily. [provider] Taking Active   hydroxypropyl methylcellulose / hypromellose (ISOPTO TEARS / GONIOVISC) 2.5 % ophthalmic solution [id number] Yes Place 2 drops into both eyes daily as needed for dry eyes. [provider] Taking Active Self  levothyroxine (SYNTHROID) 50 MCG tablet [id number] Yes Take 50 mcg by mouth every morning. [provider] Taking Active   metoprolol succinate (TOPROL-XL) 25 MG 24 hr tablet [id number] Yes TAKE 1 TABLET(25 MG) BY MOUTH DAILY Warren Lacy, PA-C Taking Active   potassium chloride SA (K-DUR,KLOR-CON) 20 MEQ tablet [id number] Yes TAKE 1 TABLET(20 MEQ) BY MOUTH DAILY  Patient taking differently:  Take 60 mEq by mouth daily.   Midge Minium, MD Taking Active            Med Note Javier Glazier, [place] Lank   Wed Oct 02, 2019  8:45 AM) 3 tablets daily   simethicone (MYLICON) 80 MG chewable tablet [id number] Yes Chew 160 mg by mouth every 6 (six) hours as needed for flatulence. [provider] Taking Active Self  tiZANidine (ZANAFLEX) 4 MG tablet [id number] No Take 1 tablet (4 mg total) by mouth every 8 (eight) hours as needed for muscle spasms.  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   Vitamin D deficiency 11/06/2020   Hypokalemia 10/25/2018   Moderate protein-calorie malnutrition ([place]) 08/01/2018   Upper airway cough syndrome 05/22/2017   Symptomatic anemia 04/26/2017   Chronic constipation 07/07/2016   History of colonic polyps 07/07/2016   Chronic anticoagulation 07/07/2016   Iron deficiency anemia 06/15/2016   Anxiety and depression 05/18/2016   Physical exam 11/18/2015    Pain in joint, ankle and foot 11/16/2015   PAT (paroxysmal atrial tachycardia) ([place]) 09/08/2015   Hyperlipidemia 07/30/2015   CAD (coronary artery disease)    Chest pain at rest, not due to CAD, possible due to a fib. 05/15/2015   Chronic bronchitis ([place]) 06/17/2011   Orthostatic hypotension 12/06/2010   Paroxysmal atrial fibrillation ([place])    Renal insufficiency    Myalgia    Hypothyroidism    Glaucoma    GERD, SEVERE 08/19/2010   Osteoarthritis 04/17/2008   FIBROMYALGIA 04/17/2008    Immunization History  Administered Date(s) Administered   Fluad Quad(high Dose 65+) 10/02/2019   Influenza Whole 06/22/2009   Influenza, High Dose Seasonal PF 07/06/2017, 08/01/2018   Influenza-Unspecified 07/07/2021   PFIZER(Purple Top)SARS-COV-2 Vaccination 10/18/2019, 11/13/2019, 05/28/2020   Pneumococcal Conjugate-13 11/18/2015   Pneumococcal Polysaccharide-23 08/23/2003, 07/06/2017   Tdap 07/10/2012    Conditions to be addressed/monitored: CAD, HLD, HTN, IDA, Afib, adrenal insufficiency, GERD  Care Plan : ccm pharmacy care plan  Updates made by Edythe Clarity, RPH since 09/15/2021 12:00 AM     Problem: CAD, HLD, HTN, IDA, Afib, adrenal insufficiency, GERD   Priority: High     Long-Range Goal: Disease Management   Start Date: 03/17/2021  Expected End Date: 03/17/2022  Recent Progress: On track  Priority: High  Note:     Current Barriers:  Intermittent acid reflux symptoms  Pharmacist Clinical Goal(s):  Patient will verbalize ability to afford treatment regimen contact provider office for questions/concerns as evidenced notation of same in electronic health record through collaboration with PharmD and provider.   Interventions: 1:1 collaboration with Midge Minium, MD regarding development and update of comprehensive plan of care as evidenced by provider attestation and co-signature Inter-disciplinary care team collaboration (see longitudinal plan of  care) Comprehensive medication review performed; medication list updated in electronic medical record  Blood pressure  (Status:New goal.)   Med Management Intervention: Continued monitoring   (BP goal <130/80) -Controlled Now on long term corticosteroids due to adrenal sufficiency. Does note some weight gain over past several months. Continues to monitor BP and pulse at home - 110/65, 62 earlier today.  Current treatment: Furosemide 20 mg once daily Appropriate, Effective, Safe, Accessible Metoprolol succinate  25 mg once daily Appropriate, Effective, Safe, Accessible -Current exercise habits: Stays active at work, around the house, and with grandchildren.  -Denies hypotensive/hypertensive symptoms -Educated on BP goals and benefits of medications for prevention of heart attack, stroke and kidney damage; Importance of home blood pressure monitoring; -Counseled to monitor BP at home 1-2x/week, document, and provide log at future appointments -Recommended to continue current medication  Update 09/15/21 71/48 - dizzy when stands up.  One instance of this and then BP normalized. P - 60s-70s at home. She continues long term steroids. Most of her BP's have been on the lower normal side. Has FU with Dr. Chalmers Cater upcoming and plans to discuss. Went without Toprol XL for a month due to pharmacy error. Encouraged her to reach out to [place] if this issues happens again.  GERD (Goal: minimize symptoms) -Not ideally controlled -Has noticed some worsening reflux past several months. Has OTC Pepcid 10 mg - will take every few days. Will consider increasing frequency and understands potential stomach related risks of oral steroids.   -Current treatment  Famotidine 10 mg tablets - two tablets/day as needed Appropriate, Query effective, -Reviewed triggers including those possibly caused by medications -Recommended to continue current medication  Update 09/15/21 Continues to have symptoms based on  diet. She reports OTC tums help her more than Pepcid does. Cautioned with overuse due to calcium. Work on [employer]. No changes at this time.  Bone health She reports a recent bone density test in June or July with Dr. Limmie Patricia obtain records via EPIC  Patient Goals/Self-Care Activities Patient will:  - check blood pressure 1-2x/week, document, and provide at future appointments  Medication Assistance: None required.  Patient affirms current coverage meets needs.        Patient's preferred pharmacy is:  [facility] [place], [place] [address] [address] Phone: [phone] Fax: [phone]   Follow Up:  Patient agrees to Care Plan and Follow-up.  Plan:  CPA BP call 3 months. [place] f/u call six months  Future Appointments  Date Time Provider Lucky  10/21/2021  8:15 AM [facility] [place], PharmD Clinical Pharmacist  [facility] [phone]

## 2021-09-15 ENCOUNTER — Ambulatory Visit (INDEPENDENT_AMBULATORY_CARE_PROVIDER_SITE_OTHER): Payer: Medicare Other | Admitting: Pharmacist

## 2021-09-15 DIAGNOSIS — I10 Essential (primary) hypertension: Secondary | ICD-10-CM

## 2021-09-15 DIAGNOSIS — K219 Gastro-esophageal reflux disease without esophagitis: Secondary | ICD-10-CM

## 2021-09-15 NOTE — Patient Instructions (Addendum)
Visit Information   Goals Addressed             This Visit's Progress    Track and Manage My Blood Pressure-Hypertension       Timeframe:  Long-Range Goal Priority:  High Start Date:  09/15/21                           Expected End Date: 03/15/22                      Follow Up Date 12/14/21    - check blood pressure weekly - choose a place to take my blood pressure (home, clinic or office, retail store) - write blood pressure results in a log or diary    Why is this important?   You won't feel high blood pressure, but it can still hurt your blood vessels.  High blood pressure can cause heart or kidney problems. It can also cause a stroke.  Making lifestyle changes like losing a little weight or eating less salt will help.  Checking your blood pressure at home and at different times of the day can help to control blood pressure.  If the doctor prescribes medicine remember to take it the way the doctor ordered.  Call the office if you cannot afford the medicine or if there are questions about it.     Notes:        Patient Care Plan: ccm pharmacy care plan     Problem Identified: CAD, HLD, HTN, IDA, Afib, adrenal insufficiency, GERD   Priority: High     Long-Range Goal: Disease Management   Start Date: 03/17/2021  Expected End Date: 03/17/2022  Recent Progress: On track  Priority: High  Note:     Current Barriers:  Intermittent acid reflux symptoms  Pharmacist Clinical Goal(s):  Patient will verbalize ability to afford treatment regimen contact provider office for questions/concerns as evidenced notation of same in electronic health record through collaboration with PharmD and provider.   Interventions: 1:1 collaboration with Midge Minium, MD regarding development and update of comprehensive plan of care as evidenced by provider attestation and co-signature Inter-disciplinary care team collaboration (see longitudinal plan of care) Comprehensive medication  review performed; medication list updated in electronic medical record  Blood pressure  (Status:New goal.)   Med Management Intervention: Continued monitoring   (BP goal <130/80) -Controlled Now on long term corticosteroids due to adrenal sufficiency. Does note some weight gain over past several months. Continues to monitor BP and pulse at home - 110/65, 62 earlier today.  Current treatment: Furosemide 20 mg once daily Appropriate, Effective, Safe, Accessible Metoprolol succinate 25 mg once daily Appropriate, Effective, Safe, Accessible -Current exercise habits: Stays active at work, around the house, and with grandchildren.  -Denies hypotensive/hypertensive symptoms -Educated on BP goals and benefits of medications for prevention of heart attack, stroke and kidney damage; Importance of home blood pressure monitoring; -Counseled to monitor BP at home 1-2x/week, document, and provide log at future appointments -Recommended to continue current medication  Update 09/15/21 71/48 - dizzy when stands up.  One instance of this and then BP normalized. P - 60s-70s at home. She continues long term steroids. Most of her BP's have been on the lower normal side. Has FU with Dr. Chalmers Cater upcoming and plans to discuss. Went without Toprol XL for a month due to pharmacy error. Encouraged her to reach out to [place] if this issues happens again.  GERD (Goal: minimize symptoms) -Not ideally controlled -Has noticed some worsening reflux past several months. Has OTC Pepcid 10 mg - will take every few days. Will consider increasing frequency and understands potential stomach related risks of oral steroids.   -Current treatment  Famotidine 10 mg tablets - two tablets/day as needed Appropriate, Query effective, -Reviewed triggers including those possibly caused by medications -Recommended to continue current medication  Update 09/15/21 Continues to have symptoms based on diet. She reports OTC tums help her  more than Pepcid does. Cautioned with overuse due to calcium. Work on [employer]. No changes at this time.  Bone health She reports a recent bone density test in June or July with Dr. Limmie Patricia obtain records via EPIC  Patient Goals/Self-Care Activities Patient will:  - check blood pressure 1-2x/week, document, and provide at future appointments  Medication Assistance: None required.  Patient affirms current coverage meets needs.         Patient verbalizes understanding of instructions and care plan provided today and agrees to view in [facility]. Active MyChart status confirmed with patient.   Telephone follow up appointment with pharmacy team member scheduled for: 6 months  Edythe Clarity, Center, PharmD Clinical Pharmacist  [facility] [phone]

## 2021-09-16 ENCOUNTER — Telehealth: Payer: Self-pay | Admitting: Pharmacist

## 2021-09-16 NOTE — Chronic Care Management (AMB) (Signed)
° ° °  Chronic Care Management Pharmacy Assistant   Name: Kaitlyn Adams  MRN: [id number] DOB: [birth year]   Reason for Encounter: Medication Cost Review     Medications: Outpatient Encounter Medications as of 09/16/2021  Medication Sig Note   Acetaminophen (TYLENOL ARTHRITIS PAIN PO) Take 325 mg by mouth daily as needed (FOR MILD PAIN).    ALPRAZolam (XANAX) 0.5 MG tablet Take 1 tablet (0.5 mg total) by mouth 2 (two) times daily as needed for anxiety.    amitriptyline (ELAVIL) 50 MG tablet TAKE 1 TABLET(50 MG) BY MOUTH AT BEDTIME    atorvastatin (LIPITOR) 20 MG tablet TAKE 1 TABLET(20 MG) BY MOUTH DAILY    benzonatate (TESSALON) 200 MG capsule Take 1 capsule (200 mg total) by mouth 3 (three) times daily as needed for cough.    digoxin (LANOXIN) 0.125 MG tablet TAKE 1 TABLET( 125 MCG TOTAL) BY MOUTH DAILY    famotidine (PEPCID) 10 MG tablet Take 10 mg by mouth 2 (two) times daily. (Patient not taking: Reported on 09/15/2021)    fludrocortisone (FLORINEF) 0.1 MG tablet TAKE 1 TABLET(0.1 MG) BY MOUTH DAILY    furosemide (LASIX) 20 MG tablet Take 1 tablet (20 mg total) by mouth as needed. For Leg swelling    HYDROcodone-acetaminophen (NORCO/VICODIN) 5-325 MG tablet Take 1 tablet by mouth every 6 (six) hours as needed for moderate pain.    hydrocortisone (CORTEF) 10 MG tablet Take 10 mg by mouth daily.    hydroxypropyl methylcellulose / hypromellose (ISOPTO TEARS / GONIOVISC) 2.5 % ophthalmic solution Place 2 drops into both eyes daily as needed for dry eyes.    levothyroxine (SYNTHROID) 50 MCG tablet Take 50 mcg by mouth every morning.    metoprolol succinate (TOPROL-XL) 25 MG 24 hr tablet TAKE 1 TABLET(25 MG) BY MOUTH DAILY    potassium chloride SA (K-DUR,KLOR-CON) 20 MEQ tablet TAKE 1 TABLET(20 MEQ) BY MOUTH DAILY (Patient taking differently: Take 60 mEq by mouth daily.) 10/02/2019: 3 tablets daily    simethicone (MYLICON) 80 MG chewable tablet Chew 160 mg by mouth every 6 (six) hours as  needed for flatulence.    tiZANidine (ZANAFLEX) 4 MG tablet Take 1 tablet (4 mg total) by mouth every 8 (eight) hours as needed for muscle spasms. (Patient not taking: Reported on 09/15/2021)    No facility-administered encounter medications on file as of 09/16/2021.    Medication Cost Review completed [place] is preferred [facility] is in network  Total Drug + Premium Cost for the rest of 2023: [facility]: $1,399.39 [facility]: $2,169.69  Medications are significantly cheaper with [employer].  Patient aware.   April D Calhoun, [place] Pharmacist Assistant [phone]

## 2021-09-17 ENCOUNTER — Telehealth: Payer: Self-pay | Admitting: Cardiology

## 2021-09-17 ENCOUNTER — Other Ambulatory Visit: Payer: Self-pay

## 2021-09-17 DIAGNOSIS — Z79899 Other long term (current) drug therapy: Secondary | ICD-10-CM

## 2021-09-17 MED ORDER — DIGOXIN 125 MCG PO TABS: ORAL_TABLET | ORAL | 1 refills | Status: DC

## 2021-09-17 NOTE — Telephone Encounter (Signed)
Spoke to [employer] pharmacist who report pt's current digoxin manufacture is on back order and needing approval to Therapist, music.   Per Pharmd D, ok to switch but recommend pt check dig level in 1 month.   Pt and [facility] pharmacist made aware.

## 2021-09-17 NOTE — Telephone Encounter (Signed)
Pt c/o medication issue:  1. Name of Medication: digoxin (LANOXIN) 0.125 MG tablet  2. How are you currently taking this medication (dosage and times per day)? 1 tablet daily  3. Are you having a reaction (difficulty breathing--STAT)? no  4. What is your medication issue? Patient states the manufacturer has the medication on back order. She would like a call back about getting another medication. She says she only has 3 tablets left.

## 2021-09-17 NOTE — Chronic Care Management (AMB) (Signed)
15 minutes spent in review, coordination, and documentation. Assisted patient with digoxin issue concerning backorder of original manufacturer.  Reviewed by: Beverly Milch, PharmD Clinical Pharmacist [phone]

## 2021-09-30 ENCOUNTER — Other Ambulatory Visit: Payer: Self-pay | Admitting: Physician Assistant

## 2021-10-13 ENCOUNTER — Other Ambulatory Visit: Payer: Self-pay | Admitting: Family Medicine

## 2021-10-13 DIAGNOSIS — Z1231 Encounter for screening mammogram for malignant neoplasm of breast: Secondary | ICD-10-CM | POA: Diagnosis not present

## 2021-10-13 LAB — HM MAMMOGRAPHY

## 2021-10-14 ENCOUNTER — Other Ambulatory Visit: Payer: Self-pay

## 2021-10-14 ENCOUNTER — Encounter: Payer: Self-pay | Admitting: Family Medicine

## 2021-10-14 MED ORDER — ATORVASTATIN CALCIUM 20 MG PO TABS: ORAL_TABLET | ORAL | 1 refills | Status: DC

## 2021-10-18 ENCOUNTER — Ambulatory Visit: Payer: Medicare Other

## 2021-10-21 ENCOUNTER — Ambulatory Visit: Payer: Medicare Other

## 2021-10-21 DIAGNOSIS — R928 Other abnormal and inconclusive findings on diagnostic imaging of breast: Secondary | ICD-10-CM | POA: Diagnosis not present

## 2021-10-21 DIAGNOSIS — R922 Inconclusive mammogram: Secondary | ICD-10-CM | POA: Diagnosis not present

## 2021-10-21 LAB — HM MAMMOGRAPHY

## 2021-10-22 ENCOUNTER — Encounter: Payer: Self-pay | Admitting: Family Medicine

## 2021-10-22 DIAGNOSIS — Z79899 Other long term (current) drug therapy: Secondary | ICD-10-CM | POA: Diagnosis not present

## 2021-10-23 LAB — DIGOXIN LEVEL: Digoxin, Serum: 0.9 ng/mL (ref 0.5–0.9)

## 2021-10-25 ENCOUNTER — Telehealth: Payer: Self-pay | Admitting: Pharmacist

## 2021-10-25 NOTE — Progress Notes (Signed)
? ? ?Chronic Care Management ?Pharmacy Assistant  ? ?Name: Kaitlyn Adams  MRN: [id number] DOB: July 08, 1951 ? ?Reason for Encounter: Disease State - General Adherence Call ?  ? ?Recent office visits:  ?None noted.  ? ?Recent consult visits:  ?None noted.  ? ?Hospital visits:  ?None in previous 6 months ? ?Medications: ?Outpatient Encounter Medications as of 10/25/2021  ?Medication Sig Note  ? Acetaminophen (TYLENOL ARTHRITIS PAIN PO) Take 325 mg by mouth daily as needed (FOR MILD PAIN).   ? ALPRAZolam (XANAX) 0.5 MG tablet Take 1 tablet (0.5 mg total) by mouth 2 (two) times daily as needed for anxiety.   ? amitriptyline (ELAVIL) 50 MG tablet TAKE 1 TABLET(50 MG) BY MOUTH AT BEDTIME   ? atorvastatin (LIPITOR) 20 MG tablet TAKE 1 TABLET(20 MG) BY MOUTH DAILY   ? benzonatate (TESSALON) 200 MG capsule Take 1 capsule (200 mg total) by mouth 3 (three) times daily as needed for cough.   ? digoxin (LANOXIN) 0.125 MG tablet TAKE 1 TABLET( 125 MCG TOTAL) BY MOUTH DAILY   ? famotidine (PEPCID) 10 MG tablet Take 10 mg by mouth 2 (two) times daily. (Patient not taking: Reported on 09/15/2021)   ? fludrocortisone (FLORINEF) 0.1 MG tablet TAKE 1 TABLET(0.1 MG) BY MOUTH DAILY   ? furosemide (LASIX) 20 MG tablet TAKE 1 TABLET BY MOUTH AS NEEDED. FOR LEG SWELLING   ? HYDROcodone-acetaminophen (NORCO/VICODIN) 5-325 MG tablet Take 1 tablet by mouth every 6 (six) hours as needed for moderate pain.   ? hydrocortisone (CORTEF) 10 MG tablet Take 10 mg by mouth daily.   ? hydroxypropyl methylcellulose / hypromellose (ISOPTO TEARS / GONIOVISC) 2.5 % ophthalmic solution Place 2 drops into both eyes daily as needed for dry eyes.   ? levothyroxine (SYNTHROID) 50 MCG tablet Take 50 mcg by mouth every morning.   ? metoprolol succinate (TOPROL-XL) 25 MG 24 hr tablet TAKE 1 TABLET(25 MG) BY MOUTH DAILY   ? potassium chloride SA (K-DUR,KLOR-CON) 20 MEQ tablet TAKE 1 TABLET(20 MEQ) BY MOUTH DAILY (Patient taking differently: Take 60 mEq by mouth  daily.) 10/02/2019: 3 tablets daily   ? simethicone (MYLICON) 80 MG chewable tablet Chew 160 mg by mouth every 6 (six) hours as needed for flatulence.   ? tiZANidine (ZANAFLEX) 4 MG tablet Take 1 tablet (4 mg total) by mouth every 8 (eight) hours as needed for muscle spasms. (Patient not taking: Reported on 09/15/2021)   ? ?No facility-administered encounter medications on file as of 10/25/2021.  ? ?Current antihypertensive regimen:  ?Furosemide 20 mg once daily  ?Metoprolol succinate 25 mg once daily ? ?How often are you checking your Blood Pressure?  ? ? ? ?Current home BP readings:  ? ? ?What recent interventions/DTPs have been made by any provider to improve Blood Pressure control since last CPP Visit:  ? ? ? ?Any recent hospitalizations or ED visits since last visit with CPP?  ?Patient has not had any ED visits or hospitalzations since last visit with CPP.  ? ? ?What diet changes have been made to improve Blood Pressure Control?  ? ? ? ?What exercise is being done to improve your Blood Pressure Control?  ? ? ? ? ?Adherence Review: ?Is the patient currently on ACE/ARB medication? No ?Does the patient have >5 day gap between last estimated fill dates? No  ? ? ?Care Gaps ? ?AWV: done 10/12/20 ?Colonoscopy: done 06/01/17 ?DM Eye Exam: N/A ?DM Foot Exam: N/A ?Microalbumin: N/A ?HbgAIC: N/A ?DEXA: done 05/30/15 ?Mammogram:  done 10/21/21 ? ? ?Star Rating Drugs: ?atorvastatin (LIPITOR) 20 MG tablet - last filled 07/19/21 90 days  ? ?Future Appointments  ?Date Time Provider Max  ?11/11/2021  8:15 AM [facility] HEALTH COACH [facility]  ?03/16/2022  3:45 PM [facility] PHARMACIST [facility]  ? ?Multiple attempts were made to contact patient. Attempts were unsuccessful. / ls,CMA  ? ?Kaitlyn Adams, CCMA ?Clinical Pharmacist Assistant  ?[phone] ? ?

## 2021-11-11 ENCOUNTER — Ambulatory Visit (INDEPENDENT_AMBULATORY_CARE_PROVIDER_SITE_OTHER): Payer: Medicare Other

## 2021-11-11 DIAGNOSIS — Z0000 Encounter for general adult medical examination without abnormal findings: Secondary | ICD-10-CM | POA: Diagnosis not present

## 2021-11-11 NOTE — Progress Notes (Signed)
? ?Subjective:  ? Kaitlyn Adams is a 71 y.o. female who presents for Medicare Annual (Subsequent) preventive examination. ? ? ?I connected with Kaitlyn Adams today by telephone and verified that I am speaking with the correct person using two identifiers. ?Location patient: home ?Location provider: work ?Persons participating in the virtual visit: patient, provider. ?  ?I discussed the limitations, risks, security and privacy concerns of performing an evaluation and management service by telephone and the availability of in person appointments. I also discussed with the patient that there may be a patient responsible charge related to this service. The patient expressed understanding and verbally consented to this telephonic visit.  ?  ?Interactive audio and video telecommunications were attempted between this provider and patient, however failed, due to patient having technical difficulties OR patient did not have access to video capability.  We continued and completed visit with audio only. ? ?  ?Review of Systems    ? ?Cardiac Risk Factors include: advanced age (>58mn, >>41women) ? ?   ?Objective:  ?  ?Today's Vitals  ? ?There is no height or weight on file to calculate BMI. ? ? ?  11/11/2021  ?  8:22 AM 10/12/2020  ?  8:22 AM 10/02/2019  ? 10:59 AM 10/25/2018  ?  2:20 PM 10/25/2018  ? 12:05 PM 08/01/2018  ?  9:29 AM 07/11/2018  ?  8:23 AM  ?Advanced Directives  ?Does Patient Have a Medical Advance Directive? Yes Yes Yes No No Yes No  ?Type of AP[place]amed[place]of [place] will [place]First [place] will [place] will   Living will   ?Does patient want to make changes to medical advance directive?   No - Patient declined   Yes (MAU/Ambulatory/Procedural Areas - Information given)   ?Copy of [place] Chart? No - copy requested Yes - validated most recent copy scanned in chart (See row information) Yes - validated most recent  copy scanned in chart (See row information)      ?Would patient like information on creating a medical advance directive?    No - Patient declined No - Patient declined  Yes (MAU/Ambulatory/Procedural Areas - Information given)  ? ? ?Current Medications (verified) ?Outpatient Encounter Medications as of 11/11/2021  ?Medication Sig  ? Acetaminophen (TYLENOL ARTHRITIS PAIN PO) Take 325 mg by mouth daily as needed (FOR MILD PAIN).  ? amitriptyline (ELAVIL) 50 MG tablet TAKE 1 TABLET(50 MG) BY MOUTH AT BEDTIME  ? atorvastatin (LIPITOR) 20 MG tablet TAKE 1 TABLET(20 MG) BY MOUTH DAILY  ? digoxin (LANOXIN) 0.125 MG tablet TAKE 1 TABLET( 125 MCG TOTAL) BY MOUTH DAILY  ? fludrocortisone (FLORINEF) 0.1 MG tablet TAKE 1 TABLET(0.1 MG) BY MOUTH DAILY  ? furosemide (LASIX) 20 MG tablet TAKE 1 TABLET BY MOUTH AS NEEDED. FOR LEG SWELLING  ? hydrocortisone (CORTEF) 10 MG tablet Take 10 mg by mouth daily.  ? hydroxypropyl methylcellulose / hypromellose (ISOPTO TEARS / GONIOVISC) 2.5 % ophthalmic solution Place 2 drops into both eyes daily as needed for dry eyes.  ? levothyroxine (SYNTHROID) 50 MCG tablet Take 50 mcg by mouth every morning.  ? metoprolol succinate (TOPROL-XL) 25 MG 24 hr tablet TAKE 1 TABLET(25 MG) BY MOUTH DAILY  ? potassium chloride SA (K-DUR,KLOR-CON) 20 MEQ tablet TAKE 1 TABLET(20 MEQ) BY MOUTH DAILY (Patient taking differently: Take 60 mEq by mouth daily.)  ? ALPRAZolam (XANAX) 0.5 MG tablet Take 1 tablet (0.5 mg total) by mouth 2 (  two) times daily as needed for anxiety.  ? benzonatate (TESSALON) 200 MG capsule Take 1 capsule (200 mg total) by mouth 3 (three) times daily as needed for cough.  ? famotidine (PEPCID) 10 MG tablet Take 10 mg by mouth 2 (two) times daily.  ? HYDROcodone-acetaminophen (NORCO/VICODIN) 5-325 MG tablet Take 1 tablet by mouth every 6 (six) hours as needed for moderate pain.  ? simethicone (MYLICON) 80 MG chewable tablet Chew 160 mg by mouth every 6 (six) hours as needed for flatulence.  ?  tiZANidine (ZANAFLEX) 4 MG tablet Take 1 tablet (4 mg total) by mouth every 8 (eight) hours as needed for muscle spasms.  ? ?No facility-administered encounter medications on file as of 11/11/2021.  ? ? ?Allergies (verified) ?Fenofibrate, Erythromycin, Guaifenesin er, and Sulfonamide derivatives  ? ?History: ?Past Medical History:  ?Diagnosis Date  ? Anemia   ? Asthma   ? - normal spirometry 2009 FEV1  >90% predicted.  - methacoline challenge neg 11/09  ? Bronchitis   ? recurrent  ? Cataract   ? Chronic cough   ? - sinus CT 03-23-10 > neg.  - allergy profile 03-23-10 > nl, IgE 14.  - flutter valve rx 03-23-10  ? Fibromyalgia   ? GERD (gastroesophageal reflux disease)   ? exacerbating VCD  ? Glaucoma   ? Hyperlipidemia   ? Hypertension   ? Hyperthyroidism   ? with hot nodule.  - total thyroidectomy 11-05-08 benign -> subsequent hypothyroidism.  ? Hypokalemia   ? Hypothyroidism   ? Hypothyroidism   ? a. following thyroidectomy.  ? Mild CAD   ? a. LHC 04/2015 - mild nonobstructive CAD with 30% dLAD, 40% D1, 25% pLCx, 30% mRCA, 30% RPDA, normal EF >65% with normal LVEDP.  ? Orthostatic hypotension   ? Osteoarthritis   ? Paroxysmal atrial fibrillation (HCC)   ? Renal insufficiency   ? Vasomotor rhinitis   ? exacerbates VCD  ? Vocal cord dysfunction   ? proven on FOB 9/09  ? ?Past Surgical History:  ?Procedure Laterality Date  ? ABDOMINAL HYSTERECTOMY  1980  ? ABDOMINAL HYSTERECTOMY    ? BASAL CELL CARCINOMA EXCISION  11/08  ? CARDIAC CATHETERIZATION N/A 05/15/2015  ? Procedure: Left Heart Cath and Coronary Angiography;  Surgeon: Sherren Mocha, MD;  Location: [facility];  Service: Cardiovascular;  Laterality: N/A;  ? COLONOSCOPY N/A 04/27/2017  ? Dr Silverio Decamp for scant rectal bleeding and iron def anemia: Melanosis coli, lipomatous IC valve, left sided tics, non-bleeding hemorrhoid, 10 mm transverse polyp.    ? ESOPHAGOGASTRODUODENOSCOPY N/A 04/27/2017  ? Mauri Pole, MD; [facility] ENDOSCOPY. for iron def anemia.   Monilial/candidial esophagitis. Small HH.    ? EYE SURGERY    ? scar tissue removal  1982  ? THYROID SURGERY  2010  ? THYROIDECTOMY  11-05-08  ? ?Family History  ?Problem Relation Age of Onset  ? Asthma Mother   ? Heart disease Mother   ? Emphysema Father   ? Heart disease Father   ? Irregular heart beat Brother   ? Asthma Grandchild   ? ?Social History  ? ?Socioeconomic History  ? Marital status: Married  ?  Spouse name: Not on file  ? Number of children: 1  ? Years of education: Not on file  ? Highest education level: Not on file  ?Occupational History  ? Occupation: owns a Scientist, research (physical sciences)  ? Occupation: OWNER  ?  Employer: [employer]  ?Tobacco Use  ?  Smoking status: Never  ? Smokeless tobacco: Never  ?Vaping Use  ? Vaping Use: Never used  ?Substance and Sexual Activity  ? Alcohol use: Yes  ?  Alcohol/week: 1.0 standard drink  ?  Types: 1 Glasses of wine per week  ?  Comment: occasional wine or beer  ? Drug use: No  ? Sexual activity: Yes  ?  Birth control/protection: None  ?Other Topics Concern  ? Not on file  ?Social History Narrative  ? Not on file  ? ?Social Determinants of Health  ? ?Financial Resource Strain: Low Risk   ? Difficulty of Paying Living Expenses: Not hard at all  ?Food Insecurity: No Food Insecurity  ? Worried About Charity fundraiser in the Last Year: Never true  ? Ran Out of Food in the Last Year: Never true  ?Transportation Needs: No Transportation Needs  ? Lack of Transportation (Medical): No  ? Lack of Transportation (Non-Medical): No  ?Physical Activity: Insufficiently Active  ? Days of Exercise per Week: 1 day  ? Minutes of Exercise per Session: 10 min  ?Stress: Not on file  ?Social Connections: Moderately Isolated  ? Frequency of Communication with Friends and Family: Twice a week  ? Frequency of Social Gatherings with Friends and Family: Twice a week  ? Attends Religious Services: Never  ? Active Member of Clubs or Organizations: No  ? Attends Archivist  Meetings: Never  ? Marital Status: Married  ? ? ?Tobacco Counseling ?Counseling given: Not Answered ? ? ?Clinical Intake: ? ?Pre-visit preparation completed: Yes ? ?Pain : No/denies pain ? ?  ? ?Nutritional Risks: No

## 2021-11-11 NOTE — Patient Instructions (Signed)
Ms. Gowens , ?Thank you for taking time to come for your Medicare Wellness Visit. I appreciate your ongoing commitment to your health goals. Please review the following plan we discussed and let me know if I can assist you in the future.  ? ?Screening recommendations/referrals: ?Colonoscopy: declined  ?Mammogram: 10/21/21 ?Bone Density: 01/2021  ?Recommended yearly ophthalmology/optometry visit for glaucoma screening and checkup ?Recommended yearly dental visit for hygiene and checkup ? ?Vaccinations: ?Influenza vaccine: completed  ?Pneumococcal vaccine: completed  ?Tdap vaccine: 07/10/2012 ?Shingles vaccine: will consider    ? ?Advanced directives: yes  ? ?Conditions/risks identified: none  ? ?Next appointment: none  ? ? ?Preventive Care 72 Years and Older, Female ?Preventive care refers to lifestyle choices and visits with your health care provider that can promote health and wellness. ?What does preventive care include? ?A yearly physical exam. This is also called an annual well check. ?Dental exams once or twice a year. ?Routine eye exams. Ask your health care provider how often you should have your eyes checked. ?Personal lifestyle choices, including: ?Daily care of your teeth and gums. ?Regular physical activity. ?Eating a healthy diet. ?Avoiding tobacco and drug use. ?Limiting alcohol use. ?Practicing safe sex. ?Taking low-dose aspirin every day. ?Taking vitamin and mineral supplements as recommended by your health care provider. ?What happens during an annual well check? ?The services and screenings done by your health care provider during your annual well check will depend on your age, overall health, lifestyle risk factors, and family history of disease. ?Counseling  ?Your health care provider may ask you questions about your: ?Alcohol use. ?Tobacco use. ?Drug use. ?Emotional well-being. ?Home and relationship well-being. ?Sexual activity. ?Eating habits. ?History of falls. ?Memory and ability to  understand (cognition). ?Work and work Statistician. ?Reproductive health. ?Screening  ?You may have the following tests or measurements: ?Height, weight, and BMI. ?Blood pressure. ?Lipid and cholesterol levels. These may be checked every 5 years, or more frequently if you are over 72 years old. ?Skin check. ?Lung cancer screening. You may have this screening every year starting at age 17 if you have a 30-pack-year history of smoking and currently smoke or have quit within the past 15 years. ?Fecal occult blood test (FOBT) of the stool. You may have this test every year starting at age 66. ?Flexible sigmoidoscopy or colonoscopy. You may have a sigmoidoscopy every 5 years or a colonoscopy every 10 years starting at age 32. ?Hepatitis C blood test. ?Hepatitis B blood test. ?Sexually transmitted disease (STD) testing. ?Diabetes screening. This is done by checking your blood sugar (glucose) after you have not eaten for a while (fasting). You may have this done every 1-3 years. ?Bone density scan. This is done to screen for osteoporosis. You may have this done starting at age 43. ?Mammogram. This may be done every 1-2 years. Talk to your health care provider about how often you should have regular mammograms. ?Talk with your health care provider about your test results, treatment options, and if necessary, the need for more tests. ?Vaccines  ?Your health care provider may recommend certain vaccines, such as: ?Influenza vaccine. This is recommended every year. ?Tetanus, diphtheria, and acellular pertussis (Tdap, Td) vaccine. You may need a Td booster every 10 years. ?Zoster vaccine. You may need this after age 72. ?Pneumococcal 13-valent conjugate (PCV13) vaccine. One dose is recommended after age 24. ?Pneumococcal polysaccharide (PPSV23) vaccine. One dose is recommended after age 35. ?Talk to your health care provider about which screenings and vaccines you need and  how often you need them. ?This information is not  intended to replace advice given to you by your health care provider. Make sure you discuss any questions you have with your health care provider. ?Document Released: 09/04/2015 Document Revised: 04/27/2016 Document Reviewed: 06/09/2015 ?Elsevier Interactive Patient Education ? 2017 [place]. ? ?Fall Prevention in the Home ?Falls can cause injuries. They can happen to people of all ages. There are many things you can do to make your home safe and to help prevent falls. ?What can I do on the outside of my home? ?Regularly fix the edges of walkways and driveways and fix any cracks. ?Remove anything that might make you trip as you walk through a door, such as a raised step or threshold. ?Trim any bushes or trees on the path to your home. ?Use bright outdoor lighting. ?Clear any walking paths of anything that might make someone trip, such as rocks or tools. ?Regularly check to see if handrails are loose or broken. Make sure that both sides of any steps have handrails. ?Any raised decks and porches should have guardrails on the edges. ?Have any leaves, snow, or ice cleared regularly. ?Use sand or salt on walking paths during winter. ?Clean up any spills in your garage right away. This includes oil or grease spills. ?What can I do in the bathroom? ?Use night lights. ?Install grab bars by the toilet and in the tub and shower. Do not use towel bars as grab bars. ?Use non-skid mats or decals in the tub or shower. ?If you need to sit down in the shower, use a plastic, non-slip stool. ?Keep the floor dry. Clean up any water that spills on the floor as soon as it happens. ?Remove soap buildup in the tub or shower regularly. ?Attach bath mats securely with double-sided non-slip rug tape. ?Do not have throw rugs and other things on the floor that can make you trip. ?What can I do in the bedroom? ?Use night lights. ?Make sure that you have a light by your bed that is easy to reach. ?Do not use any sheets or blankets that are  too big for your bed. They should not hang down onto the floor. ?Have a firm chair that has side arms. You can use this for support while you get dressed. ?Do not have throw rugs and other things on the floor that can make you trip. ?What can I do in the kitchen? ?Clean up any spills right away. ?Avoid walking on wet floors. ?Keep items that you use a lot in easy-to-reach places. ?If you need to reach something above you, use a strong step stool that has a grab bar. ?Keep electrical cords out of the way. ?Do not use floor polish or wax that makes floors slippery. If you must use wax, use non-skid floor wax. ?Do not have throw rugs and other things on the floor that can make you trip. ?What can I do with my stairs? ?Do not leave any items on the stairs. ?Make sure that there are handrails on both sides of the stairs and use them. Fix handrails that are broken or loose. Make sure that handrails are as long as the stairways. ?Check any carpeting to make sure that it is firmly attached to the stairs. Fix any carpet that is loose or worn. ?Avoid having throw rugs at the top or bottom of the stairs. If you do have throw rugs, attach them to the floor with carpet tape. ?Make sure that you have  a light switch at the top of the stairs and the bottom of the stairs. If you do not have them, ask someone to add them for you. ?What else can I do to help prevent falls? ?Wear shoes that: ?Do not have high heels. ?Have rubber bottoms. ?Are comfortable and fit you well. ?Are closed at the toe. Do not wear sandals. ?If you use a stepladder: ?Make sure that it is fully opened. Do not climb a closed stepladder. ?Make sure that both sides of the stepladder are locked into place. ?Ask someone to hold it for you, if possible. ?Clearly mark and make sure that you can see: ?Any grab bars or handrails. ?First and last steps. ?Where the edge of each step is. ?Use tools that help you move around (mobility aids) if they are needed. These  include: ?Canes. ?Walkers. ?Scooters. ?Crutches. ?Turn on the lights when you go into a dark area. Replace any light bulbs as soon as they burn out. ?Set up your furniture so you have a clear path. Avoid moving your fur

## 2021-11-12 ENCOUNTER — Other Ambulatory Visit: Payer: Self-pay | Admitting: Family Medicine

## 2021-11-12 DIAGNOSIS — K5909 Other constipation: Secondary | ICD-10-CM

## 2021-11-16 ENCOUNTER — Encounter: Payer: Self-pay | Admitting: Family Medicine

## 2021-11-16 MED ORDER — FAMOTIDINE 40 MG PO TABS: 40.0000 mg | ORAL_TABLET | Freq: Every day | ORAL | 3 refills | Status: DC

## 2021-11-26 ENCOUNTER — Encounter: Payer: Self-pay | Admitting: Family Medicine

## 2021-12-10 ENCOUNTER — Encounter: Payer: Self-pay | Admitting: Family Medicine

## 2021-12-22 DIAGNOSIS — E2740 Unspecified adrenocortical insufficiency: Secondary | ICD-10-CM | POA: Diagnosis not present

## 2021-12-22 DIAGNOSIS — E039 Hypothyroidism, unspecified: Secondary | ICD-10-CM | POA: Diagnosis not present

## 2021-12-22 DIAGNOSIS — E559 Vitamin D deficiency, unspecified: Secondary | ICD-10-CM | POA: Diagnosis not present

## 2021-12-28 ENCOUNTER — Telehealth: Payer: Self-pay | Admitting: Pharmacist

## 2021-12-28 NOTE — Progress Notes (Signed)
Chronic Care Management Pharmacy Assistant   Name: Kaitlyn Adams  MRN: [id number] DOB: 1951/01/27   Reason for Encounter: Disease State - General Adherence Call     Recent office visits:  10/25/21 Annual Medicare Wellness Completed  Recent consult visits:  None noted.  Hospital visits:  None in previous 6 months  Medications: Outpatient Encounter Medications as of 12/28/2021  Medication Sig Note   Acetaminophen (TYLENOL ARTHRITIS PAIN PO) Take 325 mg by mouth daily as needed (FOR MILD PAIN).    ALPRAZolam (XANAX) 0.5 MG tablet Take 1 tablet (0.5 mg total) by mouth 2 (two) times daily as needed for anxiety.    amitriptyline (ELAVIL) 50 MG tablet TAKE 1 TABLET(50 MG) BY MOUTH AT BEDTIME    atorvastatin (LIPITOR) 20 MG tablet TAKE 1 TABLET(20 MG) BY MOUTH DAILY    benzonatate (TESSALON) 200 MG capsule Take 1 capsule (200 mg total) by mouth 3 (three) times daily as needed for cough.    digoxin (LANOXIN) 0.125 MG tablet TAKE 1 TABLET( 125 MCG TOTAL) BY MOUTH DAILY    famotidine (PEPCID) 40 MG tablet Take 1 tablet (40 mg total) by mouth daily.    fludrocortisone (FLORINEF) 0.1 MG tablet TAKE 1 TABLET(0.1 MG) BY MOUTH DAILY    furosemide (LASIX) 20 MG tablet TAKE 1 TABLET BY MOUTH AS NEEDED. FOR LEG SWELLING    HYDROcodone-acetaminophen (NORCO/VICODIN) 5-325 MG tablet Take 1 tablet by mouth every 6 (six) hours as needed for moderate pain.    hydrocortisone (CORTEF) 10 MG tablet Take 10 mg by mouth daily.    hydroxypropyl methylcellulose / hypromellose (ISOPTO TEARS / GONIOVISC) 2.5 % ophthalmic solution Place 2 drops into both eyes daily as needed for dry eyes.    levothyroxine (SYNTHROID) 50 MCG tablet Take 50 mcg by mouth every morning.    metoprolol succinate (TOPROL-XL) 25 MG 24 hr tablet TAKE 1 TABLET(25 MG) BY MOUTH DAILY    potassium chloride SA (K-DUR,KLOR-CON) 20 MEQ tablet TAKE 1 TABLET(20 MEQ) BY MOUTH DAILY (Patient taking differently: Take 60 mEq by mouth daily.)  10/02/2019: 3 tablets daily    simethicone (MYLICON) 80 MG chewable tablet Chew 160 mg by mouth every 6 (six) hours as needed for flatulence.    tiZANidine (ZANAFLEX) 4 MG tablet Take 1 tablet (4 mg total) by mouth every 8 (eight) hours as needed for muscle spasms.    No facility-administered encounter medications on file as of 12/28/2021.    [place] for General Review Call   Chart Review:  Have there been any documented new, changed, or discontinued medications since last visit? No (If yes, include name, dose, frequency, date) Has there been any documented recent hospitalizations or ED visits since last visit with Clinical Pharmacist? No Brief Summary (including medication and/or Diagnosis changes):   Adherence Review:  Does the Clinical Pharmacist Assistant have access to adherence rates? Yes Adherence rates for STAR metric medications (List medication(s)/day supply/ last 2 fill dates). Adherence rates for medications indicated for disease state being reviewed (List medication(s)/day supply/ last 2 fill dates). Does the patient have >5 day gap between last estimated fill dates for any of the above medications or other medication gaps? No Reason for medication gaps.   Disease State Questions:  Able to connect with Patient? Yes Did patient have any problems with their health recently? No Note problems and Concerns: Have you had any admissions or emergency room visits or worsening of your condition(s) since last visit? No Details of  ED visit, hospital visit and/or worsening condition(s): Have you had any visits with new specialists or providers since your last visit? No Explain: Have you had any new health care problem(s) since your last visit? No New problem(s) reported: Have you run out of any of your medications since you last spoke with clinical pharmacist? No What caused you to run out of your medications? Are there any medications you are not taking as  prescribed? No What kept you from taking your medications as prescribed? Are you having any issues or side effects with your medications? No Note of issues or side effects: Do you have any other health concerns or questions you want to discuss with your Clinical Pharmacist before your next visit? No Note additional concerns and questions from Patient. Are there any health concerns that you feel we can do a better job addressing? No Note Patient's response. Are you having any problems with any of the following since the last visit: (select all that apply)  None  Details: 12. Any falls since last visit? No  Details: 13. Any increased or uncontrolled pain since last visit? No  Details: 14. Next visit Type: telephone       Visit with: CPP        Date: 03/16/22        Time: 3:45pm  15. Additional Details? No    Care Gaps   AWV: done 11/11/21 Colonoscopy: done 06/01/17 DM Eye Exam: N/A DM Foot Exam: N/A Microalbumin: N/A HbgAIC: N/A DEXA: done 05/30/15 Mammogram: done 10/21/21     Star Rating Drugs: atorvastatin (LIPITOR) 20 MG tablet - last filled 10/14/21 90 days     Future Appointments  Date Time Provider [place]  03/16/2022  3:45 PM [facility] CCM PHARMACIST [facility]  11/17/2022  8:15 AM [facility] HEALTH COACH [facility] [place], [place] Clinical Pharmacist Assistant  [phone]

## 2021-12-29 ENCOUNTER — Other Ambulatory Visit: Payer: Self-pay | Admitting: Physician Assistant

## 2021-12-29 DIAGNOSIS — E785 Hyperlipidemia, unspecified: Secondary | ICD-10-CM | POA: Diagnosis not present

## 2021-12-29 DIAGNOSIS — M899 Disorder of bone, unspecified: Secondary | ICD-10-CM | POA: Diagnosis not present

## 2021-12-29 DIAGNOSIS — E876 Hypokalemia: Secondary | ICD-10-CM | POA: Diagnosis not present

## 2021-12-29 DIAGNOSIS — E559 Vitamin D deficiency, unspecified: Secondary | ICD-10-CM | POA: Diagnosis not present

## 2021-12-29 DIAGNOSIS — E039 Hypothyroidism, unspecified: Secondary | ICD-10-CM | POA: Diagnosis not present

## 2021-12-29 DIAGNOSIS — E2740 Unspecified adrenocortical insufficiency: Secondary | ICD-10-CM | POA: Diagnosis not present

## 2022-02-10 ENCOUNTER — Other Ambulatory Visit: Payer: Self-pay

## 2022-02-10 DIAGNOSIS — K5909 Other constipation: Secondary | ICD-10-CM

## 2022-02-10 MED ORDER — AMITRIPTYLINE HCL 50 MG PO TABS: ORAL_TABLET | ORAL | 0 refills | Status: DC

## 2022-02-28 DIAGNOSIS — E039 Hypothyroidism, unspecified: Secondary | ICD-10-CM | POA: Diagnosis not present

## 2022-03-02 NOTE — Progress Notes (Deleted)
Chronic Care Management Pharmacy Note  03/02/2022 Name:  Kaitlyn Adams MRN:  [id number] DOB:  Jul 25, 1951  Recommendations/Changes made from today's visit: No Rx Changes.  Subjective: Kaitlyn Adams is an 71 y.o. year old female who is a primary patient of Tabori, Aundra Millet, MD.  The CCM team was consulted for assistance with disease management and care coordination needs.    Engaged with patient by telephone for follow up visit in response to provider referral for pharmacy case management and/or care coordination services.   Consent to Services:  The patient was given information about Chronic Care Management services, agreed to services, and gave verbal consent prior to initiation of services.  Please see initial visit note for detailed documentation.   Patient Care Team: Midge Minium, MD as PCP - General (Family Medicine) [place], Peter M, MD as PCP - Cardiology (Cardiology) Jacelyn Pi, MD as Consulting Physician (Endocrinology) Jola Schmidt, MD as Consulting Physician (Ophthalmology) [place], Peter M, MD as Consulting Physician (Cardiology) Tanda Rockers, MD as Consulting Physician (Pulmonary Disease) Edythe Clarity, [facility] (Pharmacist)  Recent office visits:  10/25/21 Annual Medicare Wellness Completed   Recent consult visits:  None noted.   Hospital visits:  None in previous 6 months  Objective:  Lab Results  Component Value Date   CREATININE 1.13 11/06/2020   CREATININE 1.18 05/13/2020   CREATININE 1.07 04/07/2020   Lab Results  Component Value Date   HGBA1C 5.6 05/14/2015   Last diabetic Eye exam: No results found for: "HMDIABEYEEXA"  Last diabetic Foot exam: No results found for: "HMDIABFOOTEX"      Component Value Date/Time   CHOL 183 11/06/2020 1105   TRIG 231.0 (H) 11/06/2020 1105   HDL 48.20 11/06/2020 1105   CHOLHDL 4 11/06/2020 1105   VLDL 46.2 (H) 11/06/2020 1105   LDLCALC 111 (H) 04/07/2020 0915   LDLDIRECT 98.0  11/06/2020 1105       Latest Ref Rng & Units 11/06/2020   11:05 AM 05/13/2020    1:31 PM 04/07/2020    9:15 AM  Hepatic Function  Total Protein 6.0 - 8.3 g/dL 7.9  6.9  7.0   Albumin 3.5 - 5.2 g/dL 4.4  4.4  4.5   AST 0 - 37 U/L '24  22  29   ' ALT 0 - 35 U/L 19  15  33   Alk Phosphatase 39 - 117 U/L 78  77  80   Total Bilirubin 0.2 - 1.2 mg/dL 0.6  0.6  0.5   Bilirubin, Direct 0.0 - 0.3 mg/dL 0.1  0.1  0.1     Lab Results  Component Value Date/Time   TSH 2.31 11/06/2020 11:05 AM   TSH 3.59 05/13/2020 01:31 PM   FREET4 1.20 11/05/2018 09:46 AM       Latest Ref Rng & Units 11/06/2020   11:05 AM 05/13/2020    1:31 PM 04/07/2020    9:15 AM  CBC  WBC 4.0 - 10.5 K/uL 7.7  9.0  7.5   Hemoglobin 12.0 - 15.0 g/dL 15.6  14.8  15.1   Hematocrit 36.0 - 46.0 % 46.2  43.7  45.4   Platelets 150.0 - 400.0 K/uL 321.0  314.0  277.0     No results found for: "VD25OH"  Clinical ASCVD:  The ASCVD Risk score (Arnett DK, et al., 2019) failed to calculate for the following reasons:   The systolic blood pressure is missing    Social History   Tobacco Use  Smoking Status Never  Smokeless Tobacco Never   BP Readings from Last 3 Encounters:  07/09/21 104/72  07/01/21 111/68  11/06/20 117/80   Pulse Readings from Last 3 Encounters:  07/09/21 76  07/01/21 85  11/06/20 80   Wt Readings from Last 3 Encounters:  07/09/21 110 lb 12.8 oz (50.3 kg)  07/01/21 110 lb 6.4 oz (50.1 kg)  11/06/20 106 lb 12.8 oz (48.4 kg)   Assessment: Review of patient past medical history, allergies, medications, health status, including review of consultants reports, laboratory and other test data, was performed as part of comprehensive evaluation and provision of chronic care management services.   SDOH:  (Social Determinants of Health) assessments and interventions performed: Yes   CCM Care Plan  Allergies  Allergen Reactions   Fenofibrate Other (See Comments)    GI side effects    Erythromycin Nausea  And Vomiting    REACTION: nausea and vomiting   Guaifenesin Er Palpitations   Sulfonamide Derivatives Nausea And Vomiting    REACTION: nausea and vomiting    Medications Reviewed Today     Reviewed by Randel Pigg, LPN (Licensed Practical Nurse) on 11/11/21 at [place] List Status: <None>   Medication Order Taking? Sig Documenting Provider Last Dose Status Informant  Acetaminophen (TYLENOL ARTHRITIS PAIN PO) [id number] Yes Take 325 mg by mouth daily as needed (FOR MILD PAIN). [provider] Taking Active Self  ALPRAZolam (XANAX) 0.5 MG tablet [id number]  Take 1 tablet (0.5 mg total) by mouth 2 (two) times daily as needed for anxiety. Midge Minium, MD  Consider Medication Status and Discontinue (Patient Preference) Self  amitriptyline (ELAVIL) 50 MG tablet [id number] Yes TAKE 1 TABLET(50 MG) BY MOUTH AT BEDTIME Midge Minium, MD Taking Active   atorvastatin (LIPITOR) 20 MG tablet [id number] Yes TAKE 1 TABLET(20 MG) BY MOUTH DAILY Midge Minium, MD Taking Active   benzonatate (TESSALON) 200 MG capsule [id number]  Take 1 capsule (200 mg total) by mouth 3 (three) times daily as needed for cough. Midge Minium, MD  Consider Medication Status and Discontinue (Patient Preference)   digoxin (LANOXIN) 0.125 MG tablet [id number] Yes TAKE 1 TABLET( 125 MCG TOTAL) BY MOUTH DAILY [place], Peter M, MD Taking Active   famotidine (PEPCID) 10 MG tablet [id number]  Take 10 mg by mouth 2 (two) times daily. [provider]  Consider Medication Status and Discontinue (Patient Preference) Self  fludrocortisone (FLORINEF) 0.1 MG tablet [id number] Yes TAKE 1 TABLET(0.1 MG) BY MOUTH DAILY Midge Minium, MD Taking Active   furosemide (LASIX) 20 MG tablet [id number] Yes TAKE 1 TABLET BY MOUTH AS NEEDED. FOR LEG SWELLING Warren Lacy, PA-C Taking Active   HYDROcodone-acetaminophen (NORCO/VICODIN) 5-325 MG tablet [id number]  Take 1 tablet by mouth every 6 (six) hours as needed  for moderate pain. Midge Minium, MD  Consider Medication Status and Discontinue (Patient Preference)   hydrocortisone (CORTEF) 10 MG tablet [id number] Yes Take 10 mg by mouth daily. [provider] Taking Active   hydroxypropyl methylcellulose / hypromellose (ISOPTO TEARS / GONIOVISC) 2.5 % ophthalmic solution [id number] Yes Place 2 drops into both eyes daily as needed for dry eyes. [provider] Taking Active Self  levothyroxine (SYNTHROID) 50 MCG tablet [id number] Yes Take 50 mcg by mouth every morning. [provider] Taking Active   metoprolol succinate (TOPROL-XL) 25 MG 24 hr tablet [id number] Yes TAKE 1 TABLET(25 MG) BY MOUTH DAILY Warren Lacy, PA-C Taking Active   potassium chloride  SA (K-DUR,KLOR-CON) 20 MEQ tablet [id number] Yes TAKE 1 TABLET(20 MEQ) BY MOUTH DAILY  Patient taking differently: Take 60 mEq by mouth daily.   Midge Minium, MD Taking Active            Med Note Javier Glazier, [place] Lank   Wed Oct 02, 2019  8:45 AM) 3 tablets daily   simethicone (MYLICON) 80 MG chewable tablet [id number]  Chew 160 mg by mouth every 6 (six) hours as needed for flatulence. [provider]  Consider Medication Status and Discontinue (Patient Preference) Self  tiZANidine (ZANAFLEX) 4 MG tablet [id number]  Take 1 tablet (4 mg total) by mouth every 8 (eight) hours as needed for muscle spasms. 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   Vitamin D deficiency 11/06/2020   Hypokalemia 10/25/2018   Moderate protein-calorie malnutrition ([place]) 08/01/2018   Upper airway cough syndrome 05/22/2017   Symptomatic anemia 04/26/2017   Chronic constipation 07/07/2016   History of colonic polyps 07/07/2016   Chronic anticoagulation  07/07/2016   Iron deficiency anemia 06/15/2016   Anxiety and depression 05/18/2016   Physical exam 11/18/2015   Pain in joint, ankle and foot 11/16/2015   PAT (paroxysmal atrial tachycardia) ([place]) 09/08/2015   Hyperlipidemia 07/30/2015   CAD (coronary artery disease)    Chest pain at rest, not due to CAD, possible due to a fib. 05/15/2015   Chronic bronchitis ([place]) 06/17/2011   Orthostatic hypotension 12/06/2010   Paroxysmal atrial fibrillation ([place])    Renal insufficiency    Myalgia    Hypothyroidism    Glaucoma    GERD, SEVERE 08/19/2010   Osteoarthritis 04/17/2008   FIBROMYALGIA 04/17/2008    Immunization History  Administered Date(s) Administered   Fluad Quad(high Dose 65+) 10/02/2019   Influenza Whole 06/22/2009   Influenza, High Dose Seasonal PF 07/06/2017, 08/01/2018   Influenza-Unspecified 07/07/2021   PFIZER(Purple Top)SARS-COV-2 Vaccination 10/18/2019, 11/13/2019, 05/28/2020   Pneumococcal Conjugate-13 11/18/2015   Pneumococcal Polysaccharide-23 08/23/2003, 07/06/2017   Tdap 07/10/2012    Conditions to be addressed/monitored: CAD, HLD, HTN, IDA, Afib, adrenal insufficiency, GERD  There are no care plans that you recently modified to display for this patient.    Patient's preferred pharmacy is:  [facility] [place], [address] [place] [address] Phone: [phone] Fax: [phone]   Follow Up:  Patient agrees to Care Plan and Follow-up.  Plan:  CPA BP call 3 months. [place] f/u call six months  Future Appointments  Date Time Provider [place]  03/16/2022  3:45 PM [facility] CCM PHARMACIST [facility]  11/17/2022  8:15 AM [facility] HEALTH COACH [facility] [place], PharmD Clinical Pharmacist  [facility] [phone]      Current Barriers:  Intermittent acid reflux symptoms  Pharmacist Clinical Goal(s):  Patient will  verbalize ability to afford treatment regimen contact provider office for questions/concerns as evidenced notation of same in electronic health record through collaboration with PharmD and provider.   Interventions: 1:1 collaboration with Midge Minium, MD regarding development and update of comprehensive plan of care as evidenced by provider attestation and co-signature Inter-disciplinary care team collaboration (see longitudinal plan of care)  Comprehensive medication review performed; medication list updated in electronic medical record  Blood pressure  (Status:New goal.)   Med Management Intervention: Continued monitoring   (BP goal <130/80) -Controlled Now on long term corticosteroids due to adrenal sufficiency. Does note some weight gain over past several months. Continues to monitor BP and pulse at home - 110/65, 62 earlier today.  Current treatment: Furosemide 20 mg once daily Appropriate, Effective, Safe, Accessible Metoprolol succinate 25 mg once daily Appropriate, Effective, Safe, Accessible -Current exercise habits: Stays active at work, around the house, and with grandchildren.  -Denies hypotensive/hypertensive symptoms -Educated on BP goals and benefits of medications for prevention of heart attack, stroke and kidney damage; Importance of home blood pressure monitoring; -Counseled to monitor BP at home 1-2x/week, document, and provide log at future appointments -Recommended to continue current medication  Update 09/15/21 71/48 - dizzy when stands up.  One instance of this and then BP normalized. P - 60s-70s at home. She continues long term steroids. Most of her BP's have been on the lower normal side. Has FU with Dr. Chalmers Cater upcoming and plans to discuss. Went without Toprol XL for a month due to pharmacy error. Encouraged her to reach out to [place] if this issues happens again.  GERD (Goal: minimize symptoms) -Not ideally controlled -Has noticed some worsening reflux  past several months. Has OTC Pepcid 10 mg - will take every few days. Will consider increasing frequency and understands potential stomach related risks of oral steroids.   -Current treatment  Famotidine 10 mg tablets - two tablets/day as needed Appropriate, Query effective, -Reviewed triggers including those possibly caused by medications -Recommended to continue current medication  Update 09/15/21 Continues to have symptoms based on diet. She reports OTC tums help her more than Pepcid does. Cautioned with overuse due to calcium. Work on [employer]. No changes at this time.  Bone health She reports a recent bone density test in June or July with Dr. Limmie Patricia obtain records via EPIC  Patient Goals/Self-Care Activities Patient will:  - check blood pressure 1-2x/week, document, and provide at future appointments  Medication Assistance: None required.  Patient affirms current coverage meets needs.

## 2022-03-14 ENCOUNTER — Other Ambulatory Visit: Payer: Self-pay

## 2022-03-14 MED ORDER — DIGOXIN 125 MCG PO TABS: ORAL_TABLET | ORAL | 1 refills | Status: DC

## 2022-03-16 ENCOUNTER — Other Ambulatory Visit: Payer: Self-pay

## 2022-03-16 ENCOUNTER — Telehealth: Payer: Medicare Other

## 2022-03-16 MED ORDER — FAMOTIDINE 40 MG PO TABS: 40.0000 mg | ORAL_TABLET | Freq: Every day | ORAL | 3 refills | Status: DC

## 2022-03-22 NOTE — Progress Notes (Unsigned)
Chronic Care Management Pharmacy Note  03/23/2022 Name:  Kaitlyn Adams MRN:  [id number] DOB:  Oct 13, 1950  Recommendations/Changes made from today's visit: No Rx Changes.  She does mention some increased pain.  We discussed other alternatives for pain besides Tylenol.  Could consider gabapentin for the pain, will discuss with PCP.  FU: 6 month FU  Subjective: Kaitlyn Adams is an 71 y.o. year old female who is a primary patient of Tabori, Aundra Millet, MD.  The CCM team was consulted for assistance with disease management and care coordination needs.    Engaged with patient by telephone for follow up visit in response to provider referral for pharmacy case management and/or care coordination services.   Consent to Services:  The patient was given information about Chronic Care Management services, agreed to services, and gave verbal consent prior to initiation of services.  Please see initial visit note for detailed documentation.   Patient Care Team: Midge Minium, MD as PCP - General (Family Medicine) Martinique, Peter M, MD as PCP - Cardiology (Cardiology) Jacelyn Pi, MD as Consulting Physician (Endocrinology) Jola Schmidt, MD as Consulting Physician (Ophthalmology) [place], Peter M, MD as Consulting Physician (Cardiology) Tanda Rockers, MD as Consulting Physician (Pulmonary Disease) Edythe Clarity, [facility] (Pharmacist)  Recent office visits:  None noted.   Recent consult visits:  None noted.    Hospital visits:  None in previous 6 months  Objective:  Lab Results  Component Value Date   CREATININE 1.13 11/06/2020   CREATININE 1.18 05/13/2020   CREATININE 1.07 04/07/2020   Lab Results  Component Value Date   HGBA1C 5.6 05/14/2015   Last diabetic Eye exam: No results found for: "HMDIABEYEEXA"  Last diabetic Foot exam: No results found for: "HMDIABFOOTEX"      Component Value Date/Time   CHOL 183 11/06/2020 1105   TRIG 231.0 (H) 11/06/2020 1105    HDL 48.20 11/06/2020 1105   CHOLHDL 4 11/06/2020 1105   VLDL 46.2 (H) 11/06/2020 1105   LDLCALC 111 (H) 04/07/2020 0915   LDLDIRECT 98.0 11/06/2020 1105       Latest Ref Rng & Units 11/06/2020   11:05 AM 05/13/2020    1:31 PM 04/07/2020    9:15 AM  Hepatic Function  Total Protein 6.0 - 8.3 g/dL 7.9  6.9  7.0   Albumin 3.5 - 5.2 g/dL 4.4  4.4  4.5   AST 0 - 37 U/L '24  22  29   ' ALT 0 - 35 U/L 19  15  33   Alk Phosphatase 39 - 117 U/L 78  77  80   Total Bilirubin 0.2 - 1.2 mg/dL 0.6  0.6  0.5   Bilirubin, Direct 0.0 - 0.3 mg/dL 0.1  0.1  0.1     Lab Results  Component Value Date/Time   TSH 2.31 11/06/2020 11:05 AM   TSH 3.59 05/13/2020 01:31 PM   FREET4 1.20 11/05/2018 09:46 AM       Latest Ref Rng & Units 11/06/2020   11:05 AM 05/13/2020    1:31 PM 04/07/2020    9:15 AM  CBC  WBC 4.0 - 10.5 K/uL 7.7  9.0  7.5   Hemoglobin 12.0 - 15.0 g/dL 15.6  14.8  15.1   Hematocrit 36.0 - 46.0 % 46.2  43.7  45.4   Platelets 150.0 - 400.0 K/uL 321.0  314.0  277.0     No results found for: "VD25OH"  Clinical ASCVD:  The 10-year ASCVD  risk score (Arnett DK, et al., 2019) is: 14.1%   Values used to calculate the score:     Age: 56 years     Sex: Female     Is Non-Hispanic African American: No     Diabetic: No     Tobacco smoker: No     Systolic Blood Pressure: 662 mmHg     Is BP treated: Yes     HDL Cholesterol: 48.2 mg/dL     Total Cholesterol: 183 mg/dL    Social History   Tobacco Use  Smoking Status Never  Smokeless Tobacco Never   BP Readings from Last 3 Encounters:  07/09/21 104/72  07/01/21 111/68  11/06/20 117/80   Pulse Readings from Last 3 Encounters:  07/09/21 76  07/01/21 85  11/06/20 80   Wt Readings from Last 3 Encounters:  07/09/21 110 lb 12.8 oz (50.3 kg)  07/01/21 110 lb 6.4 oz (50.1 kg)  11/06/20 106 lb 12.8 oz (48.4 kg)   Assessment: Review of patient past medical history, allergies, medications, health status, including review of consultants  reports, laboratory and other test data, was performed as part of comprehensive evaluation and provision of chronic care management services.   SDOH:  (Social Determinants of Health) assessments and interventions performed: No, assessed within a year  Emergency planning/management officer Strain: Low Risk  (11/11/2021)   Overall Financial Resource Strain (CARDIA)    Difficulty of Paying Living Expenses: Not hard at all     CCM Care Plan  Allergies  Allergen Reactions   Fenofibrate Other (See Comments)    GI side effects    Erythromycin Nausea And Vomiting    REACTION: nausea and vomiting   Guaifenesin Er Palpitations   Sulfonamide Derivatives Nausea And Vomiting    REACTION: nausea and vomiting    Medications Reviewed Today     Reviewed by Edythe Clarity, [facility] (Pharmacist) on 03/23/22 at 1058  Med List Status: <None>   Medication Order Taking? Sig Documenting Provider Last Dose Status Informant  Acetaminophen (TYLENOL ARTHRITIS PAIN PO) [id number] Yes Take 325 mg by mouth daily as needed (FOR MILD PAIN). [provider] Taking Active Self  ALPRAZolam (XANAX) 0.5 MG tablet [id number]  Take 1 tablet (0.5 mg total) by mouth 2 (two) times daily as needed for anxiety. Midge Minium, MD  Active Self  amitriptyline (ELAVIL) 50 MG tablet [id number] Yes Take one tablet at bedtime Midge Minium, MD Taking Active   atorvastatin (LIPITOR) 20 MG tablet [id number] Yes TAKE 1 TABLET(20 MG) BY MOUTH DAILY Midge Minium, MD Taking Active   benzonatate (TESSALON) 200 MG capsule [id number]  Take 1 capsule (200 mg total) by mouth 3 (three) times daily as needed for cough. Midge Minium, MD  Active   digoxin (LANOXIN) 0.125 MG tablet [id number] Yes TAKE 1 TABLET( 125 MCG TOTAL) BY MOUTH DAILY [place]ique, Peter M, MD Taking Active   famotidine (PEPCID) 40 MG tablet [id number] Yes Take 1 tablet (40 mg total) by mouth daily. Midge Minium, MD Taking Active   fludrocortisone (FLORINEF) 0.1 MG  tablet [id number] Yes TAKE 1 TABLET(0.1 MG) BY MOUTH DAILY Midge Minium, MD Taking Active   furosemide (LASIX) 20 MG tablet [id number] Yes TAKE 1 TABLET BY MOUTH AS NEEDED. FOR LEG SWELLING [place], Peter M, MD Taking Active   HYDROcodone-acetaminophen (NORCO/VICODIN) 5-325 MG tablet [id number]  Take 1 tablet by mouth every 6 (six) hours as needed for moderate pain. Midge Minium, MD  Active  hydrocortisone (CORTEF) 10 MG tablet [id number] Yes Take 10 mg by mouth daily. [provider] Taking Active   hydroxypropyl methylcellulose / hypromellose (ISOPTO TEARS / GONIOVISC) 2.5 % ophthalmic solution [id number] Yes Place 2 drops into both eyes daily as needed for dry eyes. [provider] Taking Active Self  levothyroxine (SYNTHROID) 50 MCG tablet [id number] Yes Take 50 mcg by mouth every morning. [provider] Taking Active   metoprolol succinate (TOPROL-XL) 25 MG 24 hr tablet [id number] Yes TAKE 1 TABLET(25 MG) BY MOUTH DAILY Warren Lacy, PA-C Taking Active   potassium chloride SA (K-DUR,KLOR-CON) 20 MEQ tablet [id number] Yes TAKE 1 TABLET(20 MEQ) BY MOUTH DAILY  Patient taking differently: Take 60 mEq by mouth daily.   Midge Minium, MD Taking Active            Med Note Javier Glazier, Denton Lank   Wed Oct 02, 2019  8:45 AM) 3 tablets daily   simethicone (MYLICON) 80 MG chewable tablet [id number]  Chew 160 mg by mouth every 6 (six) hours as needed for flatulence. [provider]  Active Self  tiZANidine (ZANAFLEX) 4 MG tablet [id number]  Take 1 tablet (4 mg total) by mouth every 8 (eight) hours as needed for muscle spasms. 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   Vitamin D deficiency 11/06/2020   Hypokalemia 10/25/2018   Moderate protein-calorie malnutrition ([place]) 08/01/2018    Upper airway cough syndrome 05/22/2017   Symptomatic anemia 04/26/2017   Chronic constipation 07/07/2016   History of colonic polyps 07/07/2016   Chronic anticoagulation 07/07/2016   Iron deficiency anemia 06/15/2016   Anxiety and depression 05/18/2016   Physical exam 11/18/2015   Pain in joint, ankle and foot 11/16/2015   PAT (paroxysmal atrial tachycardia) ([place]) 09/08/2015   Hyperlipidemia 07/30/2015   CAD (coronary artery disease)    Chest pain at rest, not due to CAD, possible due to a fib. 05/15/2015   Chronic bronchitis ([place]) 06/17/2011   Orthostatic hypotension 12/06/2010   Paroxysmal atrial fibrillation ([place])    Renal insufficiency    Myalgia    Hypothyroidism    Glaucoma    GERD, SEVERE 08/19/2010   Osteoarthritis 04/17/2008   FIBROMYALGIA 04/17/2008    Immunization History  Administered Date(s) Administered   Fluad Quad(high Dose 65+) 10/02/2019   Influenza Whole 06/22/2009   Influenza, High Dose Seasonal PF 07/06/2017, 08/01/2018   Influenza-Unspecified 07/07/2021   PFIZER(Purple Top)SARS-COV-2 Vaccination 10/18/2019, 11/13/2019, 05/28/2020   Pneumococcal Conjugate-13 11/18/2015   Pneumococcal Polysaccharide-23 08/23/2003, 07/06/2017   Tdap 07/10/2012    Conditions to be addressed/monitored: CAD, HLD, HTN, IDA, Afib, adrenal insufficiency, GERD  Care Plan : ccm pharmacy care plan  Updates made by Edythe Clarity, RPH since 03/23/2022 12:00 AM     Problem: CAD, HLD, HTN, IDA, Afib, adrenal insufficiency, GERD   Priority: High     Long-Range Goal: Disease Management   Start Date: 03/17/2021  Expected End Date: 03/17/2022  Recent Progress: On track  Priority: High  Note:     Current Barriers:  Pain and BP dropping  Pharmacist Clinical Goal(s):  Patient will verbalize ability to afford treatment regimen contact provider office for questions/concerns as evidenced notation of same in electronic health record through collaboration with PharmD and  provider.  Interventions: 1:1 collaboration with Midge Minium, MD regarding development and update of comprehensive plan of care as evidenced by provider attestation and co-signature Inter-disciplinary care team collaboration (see longitudinal plan of care) Comprehensive medication review performed; medication list updated in electronic medical record  Blood pressure  (Status:New goal.)   Med Management Intervention: Continued monitoring   (BP goal <130/80) 03/23/22 -Controlled based on home readings Continues to monitor BP and pulse at home - 106/75, 113/72 Current treatment: Furosemide 20 mg once daily Appropriate, Effective, Safe, Accessible Metoprolol succinate 25 mg once daily Appropriate, Effective, Safe, Accessible -Current exercise habits: Stays active at work, around the house, and with grandchildren.  -She is still getting dizzy after she stands up sometimes.  Denies any recent falls. -Educated on BP goals and benefits of medications for prevention of heart attack, stroke and kidney damage; Importance of home blood pressure monitoring; -Counseled to go slow, after standing up.  Concern for falls, BP not as low as in the past.  Continue to monitor, no need for changes at this time.  She did resolve issue with Toprol.  Update 09/15/21 71/48 - dizzy when stands up.  One instance of this and then BP normalized. P - 60s-70s at home. She continues long term steroids. Most of her BP's have been on the lower normal side. Has FU with Dr. Chalmers Cater upcoming and plans to discuss. Went without Toprol XL for a month due to pharmacy error. Encouraged her to reach out to [place] if this issues happens again.  GERD (Goal: minimize symptoms) 03/23/22 -Controlled  -Current treatment  Famotidine 40 mg daily Appropriate, Effective, Safe, Accessible -Reviewed triggers including those possibly caused by medications -Recommended to continue current medication -She recently increased to  famotidine 65m and reports her GERD has gotten better.  She takes medication daily.  No changes needed at this time, continue current medication. Continue to work on trigger foods.  Update 09/15/21 Continues to have symptoms based on diet. She reports OTC tums help her more than Pepcid does. Cautioned with overuse due to calcium. Work on [employer] No changes at this time.  Bone health She reports a recent bone density test in June or July with Dr. BLimmie Patriciaobtain records via EPIC  Patient Goals/Self-Care Activities Patient will:  - check blood pressure 1-2x/week, document, and provide at future appointments  Medication Assistance: None required.  Patient affirms current coverage meets needs.       Compliance/Adherence/Medication fill history: Care Gaps: DEXA Scan  Star-Rating Drugs: Atorvastatin 237m05/27/23 90ds  [facility]:  [facility] [address] [place]hone: [phone]: [phone] Follow Up:  Patient agrees to Care Plan and Follow-up.  Plan:  CPA BP call 3 months. RPHartley/u call six months  Future Appointments  Date Time Provider DeNellie3/28/2024  8:15 AM [facility] PESouth San FranciscoPharmD Clinical Pharmacist  [phone]

## 2022-03-23 ENCOUNTER — Ambulatory Visit (INDEPENDENT_AMBULATORY_CARE_PROVIDER_SITE_OTHER): Payer: Medicare Other | Admitting: Pharmacist

## 2022-03-23 DIAGNOSIS — I951 Orthostatic hypotension: Secondary | ICD-10-CM

## 2022-03-23 DIAGNOSIS — I10 Essential (primary) hypertension: Secondary | ICD-10-CM

## 2022-03-23 DIAGNOSIS — K219 Gastro-esophageal reflux disease without esophagitis: Secondary | ICD-10-CM

## 2022-03-23 NOTE — Patient Instructions (Addendum)
Visit Information   Goals Addressed             This Visit's Progress    Track and Manage My Blood Pressure-Hypertension   On track    Timeframe:  Long-Range Goal Priority:  High Start Date:  09/15/21                           Expected End Date: 03/15/22                      Follow Up Date 12/14/21    - check blood pressure weekly - choose a place to take my blood pressure (home, clinic or office, retail store) - write blood pressure results in a log or diary    Why is this important?   You won't feel high blood pressure, but it can still hurt your blood vessels.  High blood pressure can cause heart or kidney problems. It can also cause a stroke.  Making lifestyle changes like losing a little weight or eating less salt will help.  Checking your blood pressure at home and at different times of the day can help to control blood pressure.  If the doctor prescribes medicine remember to take it the way the doctor ordered.  Call the office if you cannot afford the medicine or if there are questions about it.     Notes:        Patient Care Plan: ccm pharmacy care plan     Problem Identified: CAD, HLD, HTN, IDA, Afib, adrenal insufficiency, GERD   Priority: High     Long-Range Goal: Disease Management   Start Date: 03/17/2021  Expected End Date: 03/17/2022  Recent Progress: On track  Priority: High  Note:     Current Barriers:  Pain and BP dropping  Pharmacist Clinical Goal(s):  Patient will verbalize ability to afford treatment regimen contact provider office for questions/concerns as evidenced notation of same in electronic health record through collaboration with PharmD and provider.   Interventions: 1:1 collaboration with Midge Minium, MD regarding development and update of comprehensive plan of care as evidenced by provider attestation and co-signature Inter-disciplinary care team collaboration (see longitudinal plan of care) Comprehensive medication  review performed; medication list updated in electronic medical record  Blood pressure  (Status:New goal.)   Med Management Intervention: Continued monitoring   (BP goal <130/80) 03/23/22 -Controlled based on home readings Continues to monitor BP and pulse at home - 106/75, 113/72 Current treatment: Furosemide 20 mg once daily Appropriate, Effective, Safe, Accessible Metoprolol succinate 25 mg once daily Appropriate, Effective, Safe, Accessible -Current exercise habits: Stays active at work, around the house, and with grandchildren.  -She is still getting dizzy after she stands up sometimes.  Denies any recent falls. -Educated on BP goals and benefits of medications for prevention of heart attack, stroke and kidney damage; Importance of home blood pressure monitoring; -Counseled to go slow, after standing up.  Concern for falls, BP not as low as in the past.  Continue to monitor, no need for changes at this time.  She did resolve issue with Toprol.  Update 09/15/21 71/48 - dizzy when stands up.  One instance of this and then BP normalized. P - 60s-70s at home. She continues long term steroids. Most of her BP's have been on the lower normal side. Has FU with Dr. Chalmers Cater upcoming and plans to discuss. Went without Toprol XL for a month due  to pharmacy error. Encouraged her to reach out to [place] if this issues happens again.  GERD (Goal: minimize symptoms) 03/23/22 -Controlled  -Current treatment  Famotidine 40 mg daily Appropriate, Effective, Safe, Accessible -Reviewed triggers including those possibly caused by medications -Recommended to continue current medication -She recently increased to famotidine '40mg'$  and reports her GERD has gotten better.  She takes medication daily.  No changes needed at this time, continue current medication. Continue to work on trigger foods.  Update 09/15/21 Continues to have symptoms based on diet. She reports OTC tums help her more than Pepcid  does. Cautioned with overuse due to calcium. Work on [employer]. No changes at this time.  Bone health She reports a recent bone density test in June or July with Dr. Limmie Patricia obtain records via EPIC  Patient Goals/Self-Care Activities Patient will:  - check blood pressure 1-2x/week, document, and provide at future appointments  Medication Assistance: None required.  Patient affirms current coverage meets needs.         The patient verbalized understanding of instructions, educational materials, and care plan provided today and DECLINED offer to receive copy of patient instructions, educational materials, and care plan.  Telephone follow up appointment with pharmacy team member scheduled for: 6 months  Edythe Clarity, [place], PharmD Clinical Pharmacist  [facility] [phone]

## 2022-04-12 ENCOUNTER — Other Ambulatory Visit: Payer: Self-pay

## 2022-04-12 MED ORDER — FLUDROCORTISONE ACETATE 0.1 MG PO TABS: ORAL_TABLET | ORAL | 1 refills | Status: DC

## 2022-04-12 MED ORDER — ATORVASTATIN CALCIUM 20 MG PO TABS: ORAL_TABLET | ORAL | 1 refills | Status: DC

## 2022-04-21 DIAGNOSIS — I10 Essential (primary) hypertension: Secondary | ICD-10-CM

## 2022-05-11 ENCOUNTER — Other Ambulatory Visit: Payer: Self-pay

## 2022-05-11 DIAGNOSIS — K5909 Other constipation: Secondary | ICD-10-CM

## 2022-05-11 MED ORDER — AMITRIPTYLINE HCL 50 MG PO TABS: ORAL_TABLET | ORAL | 1 refills | Status: DC

## 2022-06-16 ENCOUNTER — Telehealth: Payer: Self-pay | Admitting: Family Medicine

## 2022-06-16 NOTE — Telephone Encounter (Signed)
I spoke to the pt and she is going to call her DR who treat her Addison's disease . She states she is having some dizziness that comes and goes . She has fallen 3 times this week . We are completely booked for tomorrow and she does not want to go to the ER . Any suggestion ' s ?

## 2022-06-16 NOTE — Telephone Encounter (Signed)
Caller name: Kaitlyn Adams  On DPR?: Yes  Call back number: [phone] (mobile)  Provider they see: Midge Minium, MD  Reason for call: Pt called stating that she has fell three time this week. Pt is very sore. Pt also stated that she has been feeling weak and drizzle. We are completely booked tomorrow. Told Pt to go to the ET Per [employer]. Pt stated that she will asking her husband to take her to the ER today.

## 2022-06-17 NOTE — Telephone Encounter (Signed)
She should call her adrenal doctor and her cardiologist and alert them to her sxs.  As I have no appts today, if she can't be seen by either of them, I would want her to go to the ER for evaluation given multiple falls recently

## 2022-06-17 NOTE — Telephone Encounter (Signed)
Patient called in and was delivered this message

## 2022-06-19 ENCOUNTER — Emergency Department (HOSPITAL_COMMUNITY)
Admission: EM | Admit: 2022-06-19 | Discharge: 2022-06-19 | Disposition: A | Discharge status: Home / Self Care | Payer: Medicare Other | Attending: Emergency Medicine | Admitting: Emergency Medicine

## 2022-06-19 ENCOUNTER — Other Ambulatory Visit: Payer: Self-pay

## 2022-06-19 ENCOUNTER — Emergency Department (HOSPITAL_COMMUNITY): Payer: Medicare Other

## 2022-06-19 ENCOUNTER — Encounter (HOSPITAL_COMMUNITY): Payer: Self-pay

## 2022-06-19 DIAGNOSIS — E861 Hypovolemia: Secondary | ICD-10-CM | POA: Diagnosis not present

## 2022-06-19 DIAGNOSIS — E271 Primary adrenocortical insufficiency: Secondary | ICD-10-CM

## 2022-06-19 DIAGNOSIS — I10 Essential (primary) hypertension: Secondary | ICD-10-CM | POA: Diagnosis not present

## 2022-06-19 DIAGNOSIS — R1111 Vomiting without nausea: Secondary | ICD-10-CM | POA: Diagnosis not present

## 2022-06-19 DIAGNOSIS — E272 Addisonian crisis: Secondary | ICD-10-CM | POA: Diagnosis not present

## 2022-06-19 DIAGNOSIS — E860 Dehydration: Secondary | ICD-10-CM | POA: Diagnosis not present

## 2022-06-19 DIAGNOSIS — S0990XA Unspecified injury of head, initial encounter: Secondary | ICD-10-CM | POA: Diagnosis not present

## 2022-06-19 DIAGNOSIS — M76892 Other specified enthesopathies of left lower limb, excluding foot: Secondary | ICD-10-CM | POA: Diagnosis not present

## 2022-06-19 DIAGNOSIS — N179 Acute kidney failure, unspecified: Secondary | ICD-10-CM | POA: Insufficient documentation

## 2022-06-19 DIAGNOSIS — D72829 Elevated white blood cell count, unspecified: Secondary | ICD-10-CM | POA: Diagnosis not present

## 2022-06-19 DIAGNOSIS — E039 Hypothyroidism, unspecified: Secondary | ICD-10-CM | POA: Insufficient documentation

## 2022-06-19 DIAGNOSIS — W19XXXA Unspecified fall, initial encounter: Secondary | ICD-10-CM | POA: Insufficient documentation

## 2022-06-19 DIAGNOSIS — I2510 Atherosclerotic heart disease of native coronary artery without angina pectoris: Secondary | ICD-10-CM | POA: Insufficient documentation

## 2022-06-19 DIAGNOSIS — R531 Weakness: Secondary | ICD-10-CM | POA: Diagnosis not present

## 2022-06-19 DIAGNOSIS — I4510 Unspecified right bundle-branch block: Secondary | ICD-10-CM | POA: Diagnosis not present

## 2022-06-19 DIAGNOSIS — M7989 Other specified soft tissue disorders: Secondary | ICD-10-CM | POA: Diagnosis not present

## 2022-06-19 DIAGNOSIS — R5383 Other fatigue: Secondary | ICD-10-CM | POA: Insufficient documentation

## 2022-06-19 DIAGNOSIS — E876 Hypokalemia: Secondary | ICD-10-CM | POA: Insufficient documentation

## 2022-06-19 DIAGNOSIS — I480 Paroxysmal atrial fibrillation: Secondary | ICD-10-CM | POA: Diagnosis not present

## 2022-06-19 DIAGNOSIS — R110 Nausea: Secondary | ICD-10-CM | POA: Diagnosis not present

## 2022-06-19 DIAGNOSIS — R000 Tachycardia, unspecified: Secondary | ICD-10-CM | POA: Diagnosis not present

## 2022-06-19 DIAGNOSIS — R9431 Abnormal electrocardiogram [ECG] [EKG]: Secondary | ICD-10-CM | POA: Diagnosis not present

## 2022-06-19 DIAGNOSIS — S79922A Unspecified injury of left thigh, initial encounter: Secondary | ICD-10-CM | POA: Diagnosis not present

## 2022-06-19 DIAGNOSIS — I959 Hypotension, unspecified: Secondary | ICD-10-CM | POA: Diagnosis not present

## 2022-06-19 HISTORY — DX: Primary adrenocortical insufficiency: E27.1

## 2022-06-19 HISTORY — DX: Personal history of other medical treatment: Z92.89

## 2022-06-19 LAB — URINALYSIS, ROUTINE W REFLEX MICROSCOPIC
Bilirubin Urine: NEGATIVE
Glucose, UA: NEGATIVE mg/dL
Ketones, ur: NEGATIVE mg/dL
Leukocytes,Ua: NEGATIVE
Nitrite: NEGATIVE
Specific Gravity, Urine: 1.02 (ref 1.005–1.030)
pH: 6 (ref 5.0–8.0)

## 2022-06-19 LAB — BASIC METABOLIC PANEL
Anion gap: 11 (ref 5–15)
Anion gap: 9 (ref 5–15)
BUN: 32 mg/dL — ABNORMAL HIGH (ref 8–23)
BUN: 31 mg/dL — ABNORMAL HIGH (ref 8–23)
CO2: 19 mmol/L — ABNORMAL LOW (ref 22–32)
CO2: 20 mmol/L — ABNORMAL LOW (ref 22–32)
Calcium: 8.9 mg/dL (ref 8.9–10.3)
Calcium: 8.4 mg/dL — ABNORMAL LOW (ref 8.9–10.3)
Chloride: 100 mmol/L (ref 98–111)
Chloride: 102 mmol/L (ref 98–111)
Creatinine, Ser: 2.21 mg/dL — ABNORMAL HIGH (ref 0.44–1.00)
Creatinine, Ser: 1.91 mg/dL — ABNORMAL HIGH (ref 0.44–1.00)
GFR, Estimated: 23 mL/min — ABNORMAL LOW (ref >60)
GFR, Estimated: 28 mL/min — ABNORMAL LOW (ref >60)
Glucose, Bld: 126 mg/dL — ABNORMAL HIGH (ref 70–99)
Glucose, Bld: 97 mg/dL (ref 70–99)
Potassium: 2.1 mmol/L — CL (ref 3.5–5.1)
Potassium: 3.5 mmol/L (ref 3.5–5.1)
Sodium: 130 mmol/L — ABNORMAL LOW (ref 135–145)
Sodium: 131 mmol/L — ABNORMAL LOW (ref 135–145)

## 2022-06-19 LAB — TROPONIN I (HIGH SENSITIVITY)
Troponin I (High Sensitivity): 17 ng/L (ref <18)
Troponin I (High Sensitivity): 20 ng/L — ABNORMAL HIGH (ref <18)

## 2022-06-19 LAB — URINALYSIS, MICROSCOPIC (REFLEX)

## 2022-06-19 LAB — CBC
HCT: 40.4 % (ref 36.0–46.0)
Hemoglobin: 12.8 g/dL (ref 12.0–15.0)
MCH: 24 pg — ABNORMAL LOW (ref 26.0–34.0)
MCHC: 31.7 g/dL (ref 30.0–36.0)
MCV: 75.7 fL — ABNORMAL LOW (ref 80.0–100.0)
Platelets: 620 10*3/uL — ABNORMAL HIGH (ref 150–400)
RBC: 5.34 MIL/uL — ABNORMAL HIGH (ref 3.87–5.11)
RDW: 15.3 % (ref 11.5–15.5)
WBC: 19.1 10*3/uL — ABNORMAL HIGH (ref 4.0–10.5)
nRBC: 0 % (ref 0.0–0.2)

## 2022-06-19 LAB — MAGNESIUM: Magnesium: 1.8 mg/dL (ref 1.7–2.4)

## 2022-06-19 LAB — CBG MONITORING, ED: Glucose-Capillary: 80 mg/dL (ref 70–99)

## 2022-06-19 MED ORDER — LACTATED RINGERS IV SOLN: INTRAVENOUS | Status: DC

## 2022-06-19 MED ORDER — POTASSIUM CHLORIDE 10 MEQ/100ML IV SOLN
10.0000 meq | INTRAVENOUS | Status: AC
  Administered 2022-06-19 (×5): 10 meq via INTRAVENOUS
  Filled 2022-06-19 (×5): qty 100

## 2022-06-19 MED ORDER — POTASSIUM CHLORIDE CRYS ER 20 MEQ PO TBCR
40.0000 meq | EXTENDED_RELEASE_TABLET | Freq: Once | ORAL | Status: AC
  Administered 2022-06-19: 40 meq via ORAL
  Filled 2022-06-19: qty 2

## 2022-06-19 NOTE — ED Provider Triage Note (Signed)
Emergency Medicine Provider Triage Evaluation Note  Kaitlyn Adams , a 71 y.o. female  was evaluated in triage.  Pt complains of numerous falls over the past few weeks.  Patient states that when she tries to stand up she feels her legs are giving out on her and she falls to the ground.  Thinks she is fallen about 6 times in the past week or so.  One of which she struck her head.  No loss of consciousness.  Not on blood thinners.  They have concerns that she could be dehydrated, or could be a flareup of her Addison's disease.  She ha also felt more short of breath for the past week with exertion.  Review of Systems  Positive: Generalized weakness, falls, SOB Negative: Chest pain, palpitations, lightheadedness, syncope, dizziness  Physical Exam  BP (!) 115/96 (BP Location: Left Arm)   Pulse 73   Temp 98.1 F (36.7 C) (Oral)   Resp 16   SpO2 100%  Gen:   Awake, no distress   Resp:  Normal effort  MSK:   Moves extremities without difficulty  Other:    Medical Decision Making  Medically screening exam initiated at 10:54 AM.  Appropriate orders placed.  Kaitlyn Adams was informed that the remainder of the evaluation will be completed by another provider, this initial triage assessment does not replace that evaluation, and the importance of remaining in the ED until their evaluation is complete.  Workup initiated including imaging and laboratory evaluation   Kaitlyn Adams T, PA-C 06/19/22 1055

## 2022-06-19 NOTE — ED Triage Notes (Signed)
Per EMS- patient reports increased weakness and multiple falls in the past few days. Patient did have a fall today. Patient denies any injury.        BP-118/64  lying and when standing BP dropped to the 42'A systolic.

## 2022-06-19 NOTE — ED Provider Notes (Signed)
[facility] DEPT Provider Note   CSN: [id number] Arrival date & time: 06/19/22  1010     History  Chief Complaint  Patient presents with   Weakness   Fall    Kaitlyn Adams is a 71 y.o. female.   Weakness Fall     71 year old female with medical history significant for Addison's disease on steroids and potassium supplementation (has not taken her potassium in the last 2 days), fibromyalgia, HTN, hypothyroidism status post total thyroidectomy on Synthroid, HLD, CAD, hypokalemia paroxysmal atrial fibrillation not on anticoagulation who presents to the emergency department with multiple falls, fatigue. The patient states that she has had poor PO intake over the past few days. She states that she has had multiple episodes of near syncope and lightheadedness causing her to lose her balance and fall over the past few days. She denies any chest pain, SOB, fevers/chills, dysuria or increased frequency. She denies abdominal pain, emesis or diarrhea.  She endorses generalized weakness and fatigue. She denies dysphagia, dysarthria, facial droop, focal numbness or weakness. She denies room spinning dizziness, endorses more lightheadedness and near syncope.  Home Medications Prior to Admission medications   Medication Sig Start Date End Date Taking? Authorizing Provider  Acetaminophen (TYLENOL ARTHRITIS PAIN PO) Take 325 mg by mouth daily as needed (FOR MILD PAIN).   Yes [provider]  amitriptyline (ELAVIL) 50 MG tablet Take one tablet at bedtime 05/11/22  Yes Midge Minium, MD  atorvastatin (LIPITOR) 20 MG tablet TAKE 1 TABLET(20 MG) BY MOUTH DAILY 04/12/22  Yes Midge Minium, MD  digoxin (LANOXIN) 0.125 MG tablet TAKE 1 TABLET( 125 MCG TOTAL) BY MOUTH DAILY 03/14/22  Yes Martinique, Peter M, MD  famotidine (PEPCID) 40 MG tablet Take 1 tablet (40 mg total) by mouth daily. 03/16/22  Yes Midge Minium, MD  fludrocortisone (FLORINEF) 0.1 MG  tablet Take one tablet daily 04/12/22  Yes Midge Minium, MD  furosemide (LASIX) 20 MG tablet TAKE 1 TABLET BY MOUTH AS NEEDED. FOR LEG SWELLING 12/29/21  Yes Martinique, Peter M, MD  hydrocortisone (CORTEF) 10 MG tablet Take 10 mg by mouth daily. 09/30/20  Yes [provider]  hydroxypropyl methylcellulose / hypromellose (ISOPTO TEARS / GONIOVISC) 2.5 % ophthalmic solution Place 2 drops into both eyes daily as needed for dry eyes.   Yes [provider]  levothyroxine (SYNTHROID) 50 MCG tablet Take 50 mcg by mouth every morning. 12/23/19  Yes [provider]  metoprolol succinate (TOPROL-XL) 25 MG 24 hr tablet TAKE 1 TABLET(25 MG) BY MOUTH DAILY 07/01/21  Yes Caron Presume K, PA-C  potassium chloride SA (K-DUR,KLOR-CON) 20 MEQ tablet TAKE 1 TABLET(20 MEQ) BY MOUTH DAILY Patient taking differently: Take 60 mEq by mouth daily. 11/26/18  Yes Midge Minium, MD  ALPRAZolam Duanne Moron) 0.5 MG tablet Take 1 tablet (0.5 mg total) by mouth 2 (two) times daily as needed for anxiety. 12/07/17   Midge Minium, MD  benzonatate (TESSALON) 200 MG capsule Take 1 capsule (200 mg total) by mouth 3 (three) times daily as needed for cough. 02/01/21   Midge Minium, MD  HYDROcodone-acetaminophen (NORCO/VICODIN) 5-325 MG tablet Take 1 tablet by mouth every 6 (six) hours as needed for moderate pain. 07/09/21   Midge Minium, MD  simethicone (MYLICON) 80 MG chewable tablet Chew 160 mg by mouth every 6 (six) hours as needed for flatulence.    [provider]  tiZANidine (ZANAFLEX) 4 MG tablet Take 1 tablet (  4 mg total) by mouth every 8 (eight) hours as needed for muscle spasms. 07/09/21   Midge Minium, MD      Allergies    Fenofibrate, Erythromycin, Guaifenesin er, and Sulfonamide derivatives    Review of Systems   Review of Systems  Neurological:  Positive for weakness and light-headedness.  All other systems reviewed and are negative.   Physical  Exam Updated Vital Signs BP 95/69   Pulse 70   Temp (!) 97.1 F (36.2 C)   Resp 14   Ht 5' 2.5" (1.588 m)   Wt 48.1 kg   SpO2 97%   BMI 19.08 kg/m  Physical Exam Vitals and nursing note reviewed.  Constitutional:      General: She is not in acute distress.    Appearance: She is well-developed.  HENT:     Head: Normocephalic and atraumatic.     Mouth/Throat:     Mouth: Mucous membranes are dry.  Eyes:     Conjunctiva/sclera: Conjunctivae normal.  Cardiovascular:     Rate and Rhythm: Normal rate and regular rhythm.  Pulmonary:     Effort: Pulmonary effort is normal. No respiratory distress.     Breath sounds: Normal breath sounds.  Abdominal:     Palpations: Abdomen is soft.     Tenderness: There is no abdominal tenderness.  Musculoskeletal:        General: No swelling.     Cervical back: Neck supple.  Skin:    General: Skin is warm and dry.     Capillary Refill: Capillary refill takes less than 2 seconds.  Neurological:     Mental Status: She is alert.     Comments: MENTAL STATUS EXAM:    Orientation: Alert and oriented to person, place and time.  Memory: Cooperative, follows commands well.  Language: Speech is clear and language is normal.   CRANIAL NERVES:    CN 2 (Optic): Visual fields intact to confrontation.  CN 3,4,6 (EOM): Pupils equal and reactive to light. Full extraocular eye movement without nystagmus.  CN 5 (Trigeminal): Facial sensation is normal, no weakness of masticatory muscles.  CN 7 (Facial): No facial weakness or asymmetry.  CN 8 (Auditory): Auditory acuity grossly normal.  CN 9,10 (Glossophar): The uvula is midline, the palate elevates symmetrically.  CN 11 (spinal access): Normal sternocleidomastoid and trapezius strength.  CN 12 (Hypoglossal): The tongue is midline. No atrophy or fasciculations.Marland Kitchen   MOTOR:  Muscle Strength: 5/5RUE, 5/5LUE, 5/5RLE, 5/5LLE.   COORDINATION:   Intact finger-to-nose, no tremor.   SENSATION:   Intact to light  touch all four extremities.     Psychiatric:        Mood and Affect: Mood normal.     ED Results / Procedures / Treatments   Labs (all labs ordered are listed, but only abnormal results are displayed) Labs Reviewed  BASIC METABOLIC PANEL - Abnormal; Notable for the following components:      Result Value   Sodium 130 (*)    Potassium 2.1 (*)    CO2 19 (*)    Glucose, Bld 126 (*)    BUN 32 (*)    Creatinine, Ser 2.21 (*)    GFR, Estimated 23 (*)    All other components within normal limits  CBC - Abnormal; Notable for the following components:   WBC 19.1 (*)    RBC 5.34 (*)    MCV 75.7 (*)    MCH 24.0 (*)    Platelets 620 (*)  All other components within normal limits  URINALYSIS, ROUTINE W REFLEX MICROSCOPIC - Abnormal; Notable for the following components:   Hgb urine dipstick TRACE (*)    Protein, ur TRACE (*)    All other components within normal limits  URINALYSIS, MICROSCOPIC (REFLEX) - Abnormal; Notable for the following components:   Bacteria, UA FEW (*)    All other components within normal limits  BASIC METABOLIC PANEL - Abnormal; Notable for the following components:   Sodium 131 (*)    CO2 20 (*)    BUN 31 (*)    Creatinine, Ser 1.91 (*)    Calcium 8.4 (*)    GFR, Estimated 28 (*)    All other components within normal limits  TROPONIN I (HIGH SENSITIVITY) - Abnormal; Notable for the following components:   Troponin I (High Sensitivity) 20 (*)    All other components within normal limits  MAGNESIUM  CBG MONITORING, ED  TROPONIN I (HIGH SENSITIVITY)    EKG EKG Interpretation  Date/Time:  Sunday June 19 2022 10:22:36 EDT Ventricular Rate:  74 PR Interval:  178 QRS Duration: 151 QT Interval:  437 QTC Calculation: 485 R Axis:   96 Text Interpretation: Sinus rhythm RBBB and LPFB Anteroseptal infarct, age indeterminate Lateral leads are also involved No significant change since last tracing Confirmed by Isla Pence [phone]) on 06/20/2022  1:41:01 PM  Radiology DG Femur Min 2 Views Left  Result Date: 06/19/2022 CLINICAL DATA:  Trauma, fall EXAM: LEFT FEMUR 2 VIEWS COMPARISON:  None Available. FINDINGS: No fracture or dislocation is seen. Minimal bony spurs are seen in left knee. IMPRESSION: No fracture or dislocation is seen in left femur. Electronically Signed   By: Elmer Picker M.D.   On: 06/19/2022 14:15   DG Pelvis 1-2 Views  Result Date: 06/19/2022 CLINICAL DATA:  Fall.  Weakness.  Left hip swelling. EXAM: PELVIS - 1-2 VIEW COMPARISON:  Pelvic radiograph from 07/10/2012 FINDINGS: Loss of disc height at L5-S1. No acute fracture observed. Preserved articular space in the hips. Otherwise unremarkable. IMPRESSION: 1. No acute findings. If the patient is unable to bear weight or if there is a high clinical suspicion of occult fracture, CT or MRI of the bony pelvis could be utilized for further investigation. 2. Loss of disc height at L5-S1. Electronically Signed   By: Van Clines M.D.   On: 06/19/2022 11:47   DG Chest 2 View  Result Date: 06/19/2022 CLINICAL DATA:  Fall, weakness. EXAM: CHEST - 2 VIEW COMPARISON:  10/25/2018 FINDINGS: The lungs appear clear. Cardiac and mediastinal contours normal. No pleural effusion identified. Thoracic spondylosis. IMPRESSION: 1. No active cardiopulmonary disease is radiographically apparent. 2. Thoracic spondylosis. Electronically Signed   By: Van Clines M.D.   On: 06/19/2022 11:45   CT Head Wo Contrast  Result Date: 06/19/2022 CLINICAL DATA:  Weakness.  Following.  Head injury. EXAM: CT HEAD WITHOUT CONTRAST CT CERVICAL SPINE WITHOUT CONTRAST TECHNIQUE: Multidetector CT imaging of the head and cervical spine was performed following the standard protocol without intravenous contrast. Multiplanar CT image reconstructions of the cervical spine were also generated. RADIATION DOSE REDUCTION: This exam was performed according to the departmental dose-optimization program which  includes automated exposure control, adjustment of the mA and/or kV according to patient size and/or use of iterative reconstruction technique. COMPARISON:  10/25/2018. FINDINGS: CT HEAD FINDINGS Brain: No evidence of acute infarction, hemorrhage, hydrocephalus, extra-axial collection or mass lesion/mass effect. Vascular: No hyperdense vessel or unexpected calcification. Skull: Normal. Negative for fracture  or focal lesion. Sinuses/Orbits: Globes and orbits are unremarkable. Visualized sinuses are clear. Other: None. CT CERVICAL SPINE FINDINGS Alignment: Slight reversal of the normal cervical lordosis. No spondylolisthesis. Skull base and vertebrae: No acute fracture. No primary bone lesion or focal pathologic process. Soft tissues and spinal canal: No prevertebral fluid or swelling. No visible canal hematoma. Disc levels: Moderate loss of disc height at C4-C5, C5-C6 and C6-C7 with disc bulging and endplate spurring. No convincing disc herniation. Facet degenerative changes, greatest on the right at C3-C4. Upper chest: No acute findings. Other: None. IMPRESSION: HEAD CT 1. No acute intracranial abnormalities. CERVICAL CT 1. No fracture or acute finding. Electronically Signed   By: Lajean Manes M.D.   On: 06/19/2022 11:39   CT Cervical Spine Wo Contrast  Result Date: 06/19/2022 CLINICAL DATA:  Weakness.  Following.  Head injury. EXAM: CT HEAD WITHOUT CONTRAST CT CERVICAL SPINE WITHOUT CONTRAST TECHNIQUE: Multidetector CT imaging of the head and cervical spine was performed following the standard protocol without intravenous contrast. Multiplanar CT image reconstructions of the cervical spine were also generated. RADIATION DOSE REDUCTION: This exam was performed according to the departmental dose-optimization program which includes automated exposure control, adjustment of the mA and/or kV according to patient size and/or use of iterative reconstruction technique. COMPARISON:  10/25/2018. FINDINGS: CT HEAD  FINDINGS Brain: No evidence of acute infarction, hemorrhage, hydrocephalus, extra-axial collection or mass lesion/mass effect. Vascular: No hyperdense vessel or unexpected calcification. Skull: Normal. Negative for fracture or focal lesion. Sinuses/Orbits: Globes and orbits are unremarkable. Visualized sinuses are clear. Other: None. CT CERVICAL SPINE FINDINGS Alignment: Slight reversal of the normal cervical lordosis. No spondylolisthesis. Skull base and vertebrae: No acute fracture. No primary bone lesion or focal pathologic process. Soft tissues and spinal canal: No prevertebral fluid or swelling. No visible canal hematoma. Disc levels: Moderate loss of disc height at C4-C5, C5-C6 and C6-C7 with disc bulging and endplate spurring. No convincing disc herniation. Facet degenerative changes, greatest on the right at C3-C4. Upper chest: No acute findings. Other: None. IMPRESSION: HEAD CT 1. No acute intracranial abnormalities. CERVICAL CT 1. No fracture or acute finding. Electronically Signed   By: Lajean Manes M.D.   On: 06/19/2022 11:39    Procedures .Critical Care  Performed by: Regan Lemming, MD Authorized by: Regan Lemming, MD   Critical care provider statement:    Critical care time (minutes):  30   Critical care was necessary to treat or prevent imminent or life-threatening deterioration of the following conditions:  Endocrine crisis   Critical care was time spent personally by me on the following activities:  Development of treatment plan with patient or surrogate, discussions with consultants, evaluation of patient's response to treatment, examination of patient, ordering and review of laboratory studies, ordering and review of radiographic studies, ordering and performing treatments and interventions, pulse oximetry, re-evaluation of patient's condition and review of old charts     Medications Ordered in ED Medications  potassium chloride 10 mEq in 100 mL IVPB (0 mEq Intravenous Stopped  06/19/22 2119)  potassium chloride SA (KLOR-CON M) CR tablet 40 mEq (40 mEq Oral Given 06/19/22 1559)    ED Course/ Medical Decision Making/ A&P Clinical Course as of 06/20/22 2359  Sun Jun 19, 2022  1318 Potassium(!!): 2.1 [JL]  1318 CO2(!): 19 [JL]  1318 Creatinine(!): 2.21 [JL]    Clinical Course User Index [JL] Regan Lemming, MD  Medical Decision Making Amount and/or Complexity of Data Reviewed Labs: ordered. Decision-making details documented in ED Course. Radiology: ordered.  Risk Prescription drug management.    71 year old female with medical history significant for Addison's disease on steroids and potassium supplementation (has not taken her potassium in the last 2 days), fibromyalgia, HTN, hypothyroidism status post total thyroidectomy on Synthroid, HLD, CAD, hypokalemia paroxysmal atrial fibrillation not on anticoagulation who presents to the emergency department with multiple falls, fatigue. The patient states that she has had poor PO intake over the past few days. She states that she has had multiple episodes of near syncope and lightheadedness causing her to lose her balance and fall over the past few days. She denies any chest pain, SOB, fevers/chills, dysuria or increased frequency. She denies abdominal pain, emesis or diarrhea.  She endorses generalized weakness and fatigue. She denies dysphagia, dysarthria, facial droop, focal numbness or weakness. She denies room spinning dizziness, endorses more lightheadedness and near syncope.  On arrival, the patient was afebrile, not tachycardic or tachypneic, well-appearing, normotensive, saturating well on room air.  Laboratory evaluation significant for leukocytosis to 19.1 but the patient is on steroids chronically so this is nonspecific.  Urinalysis without evidence of UTI, CBG normal, initial troponin normal, repeat 29 mildly elevated nonspecifically, laboratory evaluation significant for hypokalemia  to 2.1 which was critically low, evidence of an AKI with a creatinine of 2.21 from baseline around 1.1.   X-ray imaging was obtained of her left femur, pelvis and chest which were negative for acute traumatic injuries.  CT head and cervical spine were also unremarkable.    The patient did stop taking her potassium supplementation for the last 3 days.  She endorses decreased oral intake.  Suspect likely prerenal AKI in the setting of poor oral intake and hypovolemia and dehydration.  IV access was obtained the patient was administered IV fluids and the patient's potassium was supplemented orally and IV.  Her magnesium was found to be normal.  Over the course of 10 hours in the emergency department, the patient's potassium subsequently corrected and normalized and improved to 3.5.    Patient is without chest discomfort.  She is not lightheaded.  She overall feels symptomatically improved.  I discussed admission for observation in the setting of the patient's dehydration and AKI, electrolyte abnormalities however, the patient elected to be discharged with a plan for outpatient recheck of her renal function.  She plans to orally rehydrate at home.  I feel that this is a reasonable alternative to admission at this time given the rapid correction of her potassium.  Return precautions provided in the event of recurrent symptoms.   Final Clinical Impression(s) / ED Diagnoses Final diagnoses:  Dehydration  AKI (acute kidney injury) ([place])  Hypokalemia  Addison's disease ([place])    Rx / DC Orders ED Discharge Orders     None         Regan Lemming, MD 06/20/22 2359

## 2022-06-19 NOTE — Discharge Instructions (Addendum)
Continue to orally rehydrate at home with pedialyte or gatorade or another electrolyte containing solution. You have an acute kidney injury and need a recheck of your kidney function in the next few days. Return for any abdominal pain, any syncope. We discussed admission for observation/rehydration, however you preferred to recover at home. Your potassium was corrected in the ED. Continue your oral supplementation at home.

## 2022-06-21 ENCOUNTER — Encounter: Payer: Self-pay | Admitting: Family Medicine

## 2022-06-21 ENCOUNTER — Ambulatory Visit (INDEPENDENT_AMBULATORY_CARE_PROVIDER_SITE_OTHER): Payer: Medicare Other | Admitting: Family Medicine

## 2022-06-21 VITALS — BP 106/70 | HR 51 | Temp 97.0°F | Resp 16 | Ht 62.5 in | Wt 104.0 lb

## 2022-06-21 DIAGNOSIS — R0602 Shortness of breath: Secondary | ICD-10-CM | POA: Diagnosis not present

## 2022-06-21 DIAGNOSIS — R5383 Other fatigue: Secondary | ICD-10-CM

## 2022-06-21 DIAGNOSIS — E876 Hypokalemia: Secondary | ICD-10-CM | POA: Diagnosis not present

## 2022-06-21 DIAGNOSIS — E2740 Unspecified adrenocortical insufficiency: Secondary | ICD-10-CM

## 2022-06-21 LAB — BASIC METABOLIC PANEL
BUN: 22 mg/dL (ref 6–23)
CO2: 20 mEq/L (ref 19–32)
Calcium: 9.8 mg/dL (ref 8.4–10.5)
Chloride: 101 mEq/L (ref 96–112)
Creatinine, Ser: 1.63 mg/dL — ABNORMAL HIGH (ref 0.40–1.20)
GFR: 31.26 mL/min — ABNORMAL LOW (ref >60.00)
Glucose, Bld: 96 mg/dL (ref 70–99)
Potassium: 3.1 mEq/L — ABNORMAL LOW (ref 3.5–5.1)
Sodium: 131 mEq/L — ABNORMAL LOW (ref 135–145)

## 2022-06-21 LAB — CBC WITH DIFFERENTIAL/PLATELET
Basophils Absolute: 0.1 10*3/uL (ref 0.0–0.1)
Basophils Relative: 0.5 % (ref 0.0–3.0)
Eosinophils Absolute: 0 10*3/uL (ref 0.0–0.7)
Eosinophils Relative: 0.2 % (ref 0.0–5.0)
HCT: 36.9 % (ref 36.0–46.0)
Hemoglobin: 11.7 g/dL — ABNORMAL LOW (ref 12.0–15.0)
Lymphocytes Relative: 6.8 % — ABNORMAL LOW (ref 12.0–46.0)
Lymphs Abs: 1 10*3/uL (ref 0.7–4.0)
MCHC: 31.6 g/dL (ref 30.0–36.0)
MCV: 74.4 fl — ABNORMAL LOW (ref 78.0–100.0)
Monocytes Absolute: 0.9 10*3/uL (ref 0.1–1.0)
Monocytes Relative: 6.2 % (ref 3.0–12.0)
Neutro Abs: 12.9 10*3/uL — ABNORMAL HIGH (ref 1.4–7.7)
Neutrophils Relative %: 86.3 % — ABNORMAL HIGH (ref 43.0–77.0)
Platelets: 527 10*3/uL — ABNORMAL HIGH (ref 150.0–400.0)
RBC: 4.96 Mil/uL (ref 3.87–5.11)
RDW: 15.9 % — ABNORMAL HIGH (ref 11.5–15.5)
WBC: 14.9 10*3/uL — ABNORMAL HIGH (ref 4.0–10.5)

## 2022-06-21 LAB — HEPATIC FUNCTION PANEL
ALT: 12 U/L (ref 0–35)
AST: 25 U/L (ref 0–37)
Albumin: 4 g/dL (ref 3.5–5.2)
Alkaline Phosphatase: 93 U/L (ref 39–117)
Bilirubin, Direct: 0.1 mg/dL (ref 0.0–0.3)
Total Bilirubin: 0.4 mg/dL (ref 0.2–1.2)
Total Protein: 8.6 g/dL — ABNORMAL HIGH (ref 6.0–8.3)

## 2022-06-21 LAB — B12 AND FOLATE PANEL
Folate: 4.8 ng/mL — ABNORMAL LOW (ref >5.9)
Vitamin B-12: 229 pg/mL (ref 211–911)

## 2022-06-21 LAB — TSH: TSH: 1.11 u[IU]/mL (ref 0.35–5.50)

## 2022-06-21 LAB — MAGNESIUM: Magnesium: 1.9 mg/dL (ref 1.5–2.5)

## 2022-06-21 MED ORDER — METOPROLOL SUCCINATE ER 25 MG PO TB24: ORAL_TABLET | ORAL | 3 refills | Status: DC

## 2022-06-21 NOTE — Assessment & Plan Note (Signed)
Ongoing issue for pt.  Following w/ Dr Chalmers Cater.  Discussed w/ pt and husband that she may need to have her medications adjusted and that they should call to schedule an appt ASAP given her fatigue and overall weakness.  They were in agreement.

## 2022-06-21 NOTE — Assessment & Plan Note (Signed)
Down to 2.1 in ER.  Up to 3.5 prior to d/c.  Repeat labs and adjust supplement prn.

## 2022-06-21 NOTE — Progress Notes (Signed)
Informed pt of lab results  

## 2022-06-21 NOTE — Progress Notes (Signed)
   Subjective:    Patient ID: Kaitlyn Adams, female    DOB: 1951/07/14, 71 y.o.   MRN: [id number]  HPI ER f/u- pt went to ER on 10/29 due to weakness and falls.  She was found to have AKI w/ Cr of 2.21 (baseline 1.1), hypokalemia at 2.1 (which corrected to 3.5 by time of d/c), leukocytosis w/ WBC 19.1 (but no source of infxn and chronic steroid use)  Continues to feel weak but has not fallen since ER visit.  'totally exhausted'.  Husband reports extreme SOB w/ minimal exertion- 'she's gasping for air'.    Sees Dr Chalmers Cater for adrenal insufficiency.     Review of Systems For ROS see HPI     Objective:   Physical Exam Vitals reviewed.  Constitutional:      General: She is not in acute distress.    Appearance: Normal appearance.     Comments: frail  HENT:     Head: Normocephalic and atraumatic.  Eyes:     Extraocular Movements: Extraocular movements intact.     Conjunctiva/sclera: Conjunctivae normal.     Pupils: Pupils are equal, round, and reactive to light.  Cardiovascular:     Rate and Rhythm: Regular rhythm. Bradycardia present.     Pulses: Normal pulses.     Heart sounds: Normal heart sounds.  Pulmonary:     Effort: Pulmonary effort is normal. No respiratory distress.     Breath sounds: Normal breath sounds. No wheezing or rhonchi.  Musculoskeletal:     Cervical back: Neck supple. No rigidity.     Right lower leg: No edema.     Left lower leg: No edema.  Lymphadenopathy:     Cervical: No cervical adenopathy.  Skin:    General: Skin is warm and dry.  Neurological:     General: No focal deficit present.     Mental Status: She is alert and oriented to person, place, and time.  Psychiatric:        Mood and Affect: Mood normal.        Behavior: Behavior normal.           Assessment & Plan:   Fatigue/SOB- new.  Pt reports overall weakness and fatigue.  Has not fallen since she got home from the ER.  Pt's bradycardia and low BP may be contributing.  Will  decrease Metoprolol to 1/2 tab daily.  Encouraged her to schedule a f/u w/ Cardiology.  Currently HR is regular and no evidence of recurrent Afib.  Check labs to r/o other metabolic causes of fatigue such as TSH, B12/folate.  Will have pt return in 2-3 weeks to recheck BP and fatigue due to the lower dose of Metoprolol.  Pt expressed understanding and is in agreement w/ plan.

## 2022-06-21 NOTE — Patient Instructions (Signed)
Follow up in 2-3 weeks to recheck BP and fatigue We'll notify you of your lab results and make any changes if needed DECREASE the Metoprolol to 1/2 tab daily SCHEDULE a follow up w/ Dr Chalmers Cater and Cardiology- let them know you were in the hospital Drink LOTS of fluids ADD a daily protein shake Call with any questions or concerns Stay Safe!  Stay Healthy! Hang In There!!!

## 2022-06-23 NOTE — Progress Notes (Signed)
Cardiology Office Note:    Date:  06/24/2022   ID:  Kaitlyn Adams, Kaitlyn Adams 07-08-1951, MRN [id number]  PCP:  Midge Minium, MD   [place] [facility] Cardiologist:  Peter Martinique, MD     Referring MD: Midge Minium, MD   Chief Complaint  Patient presents with   Follow-up  Near syncope  History of Present Illness:    Kaitlyn Adams is a 71 y.o. female with a hx of PAT, PAF , HTN, HLD, hyperthyroidism s/p thyroidectomy, Addison's disease on Florinef and hydrocortisone, asthma, GERD, fibromyalgia, nonobstructive CAD by LHC, and syncope.  She has chronic dyspnea on exertion and increase in palpitations when she has run out of her beta-blocker.  She was last seen by Caron Presume, PA-C 07/01/2021.  She has increased palpitations when she is physically or mentally stressed.  She presents today for follow-up of syncope.  She was seen in the ER 06/19/2022 after an episode of near syncope and lightheadedness causing her to lose balance and fall over in the past few days.  She has had multiple episodes.  She had not taken her potassium supplement in 2 days.  She was afebrile and not tachycardic or tachypneic and well-appearing.  She did have a white count of 19.1 but is on chronic steroids for Addison's.  UA negative.  She was hypokalemic at 2.1 with evidence of AKI with a creatinine 2.21 from a baseline of 1.1.  She also reported poor p.o. intake.  She was temporized and fluid resuscitated.  She was ultimately discharged without admission.  She was advised to follow-up with cardiology.  She was placed on my schedule. PCP reduced toprol to 12.5 mg for bradycardia and hypotension. She states she was vomiting and had not had medications. Unclear etiology of vomiting. She feels unsteady and weak on exam.   Orthostatic vitals today did show a decrease in SBP from 134 lying to 76 with standing. Three minutes standing resulted in a BP of 77/51 with HR 96. She reported feeling  "wobbly."  I have also taken care of her husband.   Past Medical History:  Diagnosis Date   Addison disease ([place])    Anemia    Asthma    - normal spirometry 2009 FEV1  >90% predicted.  - methacoline challenge neg 11/09   Bronchitis    recurrent   Cataract    Chronic cough    - sinus CT 03-23-10 > neg.  - allergy profile 03-23-10 > nl, IgE 14.  - flutter valve rx 03-23-10   Fibromyalgia    GERD (gastroesophageal reflux disease)    exacerbating VCD   Glaucoma    History of blood transfusion    Hyperlipidemia    Hypertension    Hyperthyroidism    with hot nodule.  - total thyroidectomy 11-05-08 benign -> subsequent hypothyroidism.   Hypokalemia    Hypothyroidism    Hypothyroidism    a. following thyroidectomy.   Mild CAD    a. LHC 04/2015 - mild nonobstructive CAD with 30% dLAD, 40% D1, 25% pLCx, 30% mRCA, 30% RPDA, normal EF >65% with normal LVEDP.   Orthostatic hypotension    Paroxysmal atrial fibrillation (HCC)    Renal insufficiency    Vasomotor rhinitis    exacerbates VCD   Vocal cord dysfunction    proven on FOB 9/09    Past Surgical History:  Procedure Laterality Date   ABDOMINAL HYSTERECTOMY  1980   ABDOMINAL HYSTERECTOMY     BASAL  CELL CARCINOMA EXCISION  11/08   CARDIAC CATHETERIZATION N/A 05/15/2015   Procedure: Left Heart Cath and Coronary Angiography;  Surgeon: Sherren Mocha, MD;  Location: [facility];  Service: Cardiovascular;  Laterality: N/A;   COLONOSCOPY N/A 04/27/2017   Dr Silverio Decamp for scant rectal bleeding and iron def anemia: Melanosis coli, lipomatous IC valve, left sided tics, non-bleeding hemorrhoid, 10 mm transverse polyp.     ESOPHAGOGASTRODUODENOSCOPY N/A 04/27/2017   Mauri Pole, MD; [facility] ENDOSCOPY. for iron def anemia.  Monilial/candidial esophagitis. Small HH.     EYE SURGERY     scar tissue removal  1982   THYROID SURGERY  2010   THYROIDECTOMY  11-05-08    Current Medications: Current Meds  Medication Sig   Acetaminophen  (TYLENOL ARTHRITIS PAIN PO) Take 325 mg by mouth daily as needed (FOR MILD PAIN).   amitriptyline (ELAVIL) 50 MG tablet Take one tablet at bedtime   atorvastatin (LIPITOR) 20 MG tablet TAKE 1 TABLET(20 MG) BY MOUTH DAILY   famotidine (PEPCID) 40 MG tablet Take 1 tablet (40 mg total) by mouth daily.   fludrocortisone (FLORINEF) 0.1 MG tablet Take one tablet daily   hydrocortisone (CORTEF) 10 MG tablet Take 10 mg by mouth daily.   hydroxypropyl methylcellulose / hypromellose (ISOPTO TEARS / GONIOVISC) 2.5 % ophthalmic solution Place 2 drops into both eyes daily as needed for dry eyes.   levothyroxine (SYNTHROID) 50 MCG tablet Take 50 mcg by mouth every morning.   [DISCONTINUED] furosemide (LASIX) 20 MG tablet TAKE 1 TABLET BY MOUTH AS NEEDED. FOR LEG SWELLING   [DISCONTINUED] metoprolol succinate (TOPROL-XL) 25 MG 24 hr tablet Take 1/2 tab (12.110m) daily   [DISCONTINUED] potassium chloride SA (K-DUR,KLOR-CON) 20 MEQ tablet TAKE 1 TABLET(20 MEQ) BY MOUTH DAILY (Patient taking differently: Take 60 mEq by mouth daily.)     Allergies:   Fenofibrate, Erythromycin, Guaifenesin er, and Sulfonamide derivatives   Social History   Socioeconomic History   Marital status: Married    Spouse name: Not on file   Number of children: 1   Years of education: Not on file   Highest education level: Not on file  Occupational History   Occupation: owns a plumbing business   Occupation: OWNER    Employer: [email or web address]   Smoking status: Never   Smokeless tobacco: Never  Vaping Use   Vaping Use: Never used  Substance and Sexual Activity   Alcohol use: Yes    Alcohol/week: 1.0 standard drink of alcohol    Types: 1 Glasses of wine per week    Comment: occasional wine or beer   Drug use: No   Sexual activity: Yes    Birth control/protection: None  Other Topics Concern   Not on file  Social History Narrative   Not on file   Social Determinants of Health   Financial Resource  Strain: Low Risk  (11/11/2021)   Overall Financial Resource Strain (CARDIA)    Difficulty of Paying Living Expenses: Not hard at all  Food Insecurity: No Food Insecurity (11/11/2021)   Hunger Vital Sign    Worried About Running Out of Food in the Last Year: Never true    Ran Out of Food in the Last Year: Never true  Transportation Needs: No Transportation Needs (11/11/2021)   PRAPARE - THydrologist(Medical): No    Lack of Transportation (Non-Medical): No  Physical Activity: Insufficiently Active (11/11/2021)   Exercise Vital Sign  Days of Exercise per Week: 1 day    Minutes of Exercise per Session: 10 min  Stress: No Stress Concern Present (10/12/2020)   [place]    Feeling of Stress : Only a little  Social Connections: Moderately Isolated (11/11/2021)   Social Connection and Isolation Panel [NHANES]    Frequency of Communication with Friends and Family: Twice a week    Frequency of Social Gatherings with Friends and Family: Twice a week    Attends Religious Services: Never    Marine scientist or Organizations: No    Attends Music therapist: Never    Marital Status: Married     Family History: The patient's family history includes Asthma in her grandchild and mother; Emphysema in her father; Heart disease in her father and mother; Irregular heart beat in her brother.  ROS:   Please see the history of present illness.     All other systems reviewed and are negative.  EKGs/Labs/Other Studies Reviewed:    The following studies were reviewed today:  Echo pending  EKG:  EKG is not ordered today.    Recent Labs: 06/21/2022: ALT 12; BUN 22; Creatinine, Ser 1.63; Hemoglobin 11.7; Magnesium 1.9; Platelets 527.0; Potassium 3.1; Sodium 131; TSH 1.11  Recent Lipid Panel    Component Value Date/Time   CHOL 183 11/06/2020 1105   TRIG 231.0 (H) 11/06/2020 1105   HDL  48.20 11/06/2020 1105   CHOLHDL 4 11/06/2020 1105   VLDL 46.2 (H) 11/06/2020 1105   LDLCALC 111 (H) 04/07/2020 0915   LDLDIRECT 98.0 11/06/2020 1105     Risk Assessment/Calculations:                Physical Exam:    VS:  BP 110/64 (BP Location: Right Arm, Patient Position: Sitting, Cuff Size: Normal)   Pulse 86   Ht _0  (1.575 m)   Wt 101 lb 3.2 oz (45.9 kg)   SpO2 100%   BMI 18.51 kg/m     Wt Readings from Last 3 Encounters:  06/24/22 101 lb 3.2 oz (45.9 kg)  06/21/22 104 lb (47.2 kg)  06/19/22 106 lb (48.1 kg)     GEN: thin elderly female in nad HEENT: Normal NECK: No JVD; No carotid bruits LYMPHATICS: No lymphadenopathy CARDIAC: RRR, no murmurs, rubs, gallops RESPIRATORY:  Clear to auscultation without rales, wheezing or rhonchi  ABDOMEN: Soft, non-tender, non-distended MUSCULOSKELETAL:  No edema; No deformity  SKIN: Warm and dry NEUROLOGIC:  Alert and oriented x 3 PSYCHIATRIC:  Normal affect   ASSESSMENT:    1. Near syncope   2. Other fatigue   3. Orthostatic hypotension   4. Bradycardia   5. DOE (dyspnea on exertion)   6. Palpitations   7. Paroxysmal atrial fibrillation (HCC)   8. PAT (paroxysmal atrial tachycardia)   9. Hypokalemia   10. Addison's disease ([place])   11. Hypothyroidism, unspecified type   12. Mitral valve insufficiency, unspecified etiology    PLAN:    In order of problems listed above:  Near syncope Fatigue  Bradycardia Hypotension  Orthostatic hypotension In the setting of hypokalemia and poor PO intake, but also bradycardic with low BP. She is orthostatic today. I will stop toprol altogether. I have recommended thigh high compression stockings and abdominal binder.  I will hold off on ordering midodrine - will defer to endocrinology. Already on florinef.   I did consult pharmD for possible midodrine and droxidopa - droxidopa  may be a good option, but may exacerbate arrhythmias. Will hold off until after heart  monitor. Midodrine interacts with levothyroxine and florinef.     Shortness of breath with exertion Has a chronic degree of DOE, but she states this is worse over the last 2 weeks. This is also in the setting of increased stress.  Will repeat echo. She appears euvolemic.    Palpitations Paroxysmal atrial fibrillation Paroxysmal atrial tachycardia She is not anticoagulated Will D/C toprol.  Unclear why she isn't anticoagulated Will place a heart monitor to evaluate possible causes of her near syncope   Hypokalemia She is supposed to be taking 60 mEq potassium Was vomiting and not keeping medications down   Hyperlipidemia Maintained on 20 mg Lipitor   Addison's disease Hypothyroidism Hydrocortisone, florinef, synthroid  Follow up in 1 month.   Medication Adjustments/Labs and Tests Ordered: Current medicines are reviewed at length with the patient today.  Concerns regarding medicines are outlined above.  Orders Placed This Encounter  Procedures   Ambulatory Referral for DME   LONG TERM MONITOR-LIVE TELEMETRY (3-14 DAYS)   ECHOCARDIOGRAM COMPLETE   Meds ordered this encounter  Medications   furosemide (LASIX) 20 MG tablet    Sig: Take 3 tablets (60 mg total) by mouth daily.    Dispense:  270 tablet    Refill:  3   potassium chloride SA (KLOR-CON M) 20 MEQ tablet    Sig: Take 3 tablets (60 mEq total) by mouth daily.    Dispense:  270 tablet    Refill:  3    Patient Instructions  Medication Instructions:  STOP Metoprolol Succinate (Toprol-XL)   *If you need a refill on your cardiac medications before your next appointment, please call your pharmacy*  Lab Work: NONE ordered at this time of appointment   If you have labs (blood work) drawn today and your tests are completely normal, you will receive your results only by: [place] (if you have MyChart) OR A paper copy in the mail If you have any lab test that is abnormal or we need to change your  treatment, we will call you to review the results.  Testing/Procedures: Fabian Sharp, PA-C has requested that you have an echocardiogram. Echocardiography is a painless test that uses sound waves to create images of your heart. It provides your doctor with information about the size and shape of your heart and how well your heart's chambers and valves are working. This procedure takes approximately one hour. There are no restrictions for this procedure. Please do NOT wear cologne, perfume, aftershave, or lotions (deodorant is allowed). Please arrive 15 minutes prior to your appointment time.  ZIO AT Long term monitor-Live Telemetry  Fabian Sharp, PA-C has requested you wear a ZIO patch monitor for 14 days.  This is a single patch monitor. [employer] supplies one patch monitor per enrollment. Additional  stickers are not available.  Please do not apply patch if you will be having a Nuclear Stress Test, Echocardiogram, Cardiac CT, MRI,  or Chest Xray during the period you would be wearing the monitor. The patch cannot be worn during  these tests. You cannot remove and re-apply the ZIO AT patch monitor.  Your ZIO patch monitor will be mailed 3 day USPS to your address on file. It may take 3-5 days to  receive your monitor after you have been enrolled.  Once you have received your monitor, please review the enclosed instructions. Your monitor has  already been registered assigning  a specific monitor serial # to you.   Billing and Patient Assistance Program information  Theodore Demark has been supplied with any insurance information on record for billing. [employer] offers a sliding scale Patient Assistance Program for patients without insurance, or whose  insurance does not completely cover the cost of the ZIO patch monitor. You must apply for the  Patient Assistance Program to qualify for the discounted rate. To apply, call [employer] at [phone],  select option 4, select option 2 , ask to apply for the  Patient Assistance Program, (you can request an  interpreter if needed). [employer] will ask your household income and how many people are in your  household. [employer] will quote your out-of-pocket cost based on this information. They will also be able  to set up a 12 month interest free payment plan if needed.  Applying the monitor   Shave hair from upper left chest.  Hold the abrader disc by orange tab. Rub the abrader in 40 strokes over left upper chest as indicated in  your monitor instructions.  Clean area with 4 enclosed alcohol pads. Use all pads to ensure the area is cleaned thoroughly. Let  dry.  Apply patch as indicated in monitor instructions. Patch will be placed under collarbone on left side of  chest with arrow pointing upward.  Rub patch adhesive wings for 2 minutes. Remove the white label marked "1". Remove the white label  marked "2". Rub patch adhesive wings for 2 additional minutes.  While looking in a mirror, press and release button in center of patch. A small green light will flash 3-4  times. This will be your only indicator that the monitor has been turned on.  Do not shower for the first 24 hours. You may shower after the first 24 hours.  Press the button if you feel a symptom. You will hear a small click. Record Date, Time and Symptom in  the Patient Log.   Starting the Gateway  In your kit there is a Hydrographic surveyor box the size of a cellphone. This is Airline pilot. It transmits all your  recorded data to [facility]. This box must always stay within 10 feet of you. Open the box and push the *  button. There will be a light that blinks orange and then green a few times. When the light stops  blinking, the Gateway is connected to the ZIO patch. Call [employer] at [phone] to confirm your monitor is transmitting.  Returning your monitor  Remove your patch and place it inside the [place]. In the lower half of the Gateway there is a white  bag with prepaid postage on  it. Place Gateway in bag and seal. Mail package back to [place] as soon as  possible. Your physician should have your final report approximately 7 days after you have mailed back  your monitor. Call [place] at [phone] if you have questions regarding your ZIO AT  patch monitor. Call them immediately if you see an orange light blinking on your monitor.  If your monitor falls off in less than 4 days, contact our Monitor department at [phone]. If your  monitor becomes loose or falls off after 4 days call [employer] at [phone] for suggestions on  securing your monitor  Follow-Up: At [facility], you and your health needs are our priority.  As part of our continuing mission to provide you with exceptional heart care, we have created designated Provider Care Teams.  These Care Teams  include your primary Cardiologist (physician) and Advanced Practice Providers (APPs -  Physician Assistants and Nurse Practitioners) who all work together to provide you with the care you need, when you need it.   Your next appointment:   1 month(s)  The format for your next appointment:   In Person  Provider:   Fabian Sharp, PA-C        Other Instructions Fabian Sharp, PA-C has requested that you wear an abdominal binder and thigh high compression sock.   Important Information About Sugar         Signed, Ledora Bottcher, [place]  06/24/2022 9:35 AM    [place]

## 2022-06-24 ENCOUNTER — Ambulatory Visit: Payer: Medicare Other | Attending: Physician Assistant | Admitting: Physician Assistant

## 2022-06-24 ENCOUNTER — Encounter: Payer: Self-pay | Admitting: Physician Assistant

## 2022-06-24 ENCOUNTER — Telehealth: Payer: Self-pay | Admitting: Pharmacist

## 2022-06-24 ENCOUNTER — Other Ambulatory Visit (INDEPENDENT_AMBULATORY_CARE_PROVIDER_SITE_OTHER): Payer: Medicare Other

## 2022-06-24 VITALS — BP 110/64 | HR 86 | Ht 62.0 in | Wt 101.2 lb

## 2022-06-24 DIAGNOSIS — I340 Nonrheumatic mitral (valve) insufficiency: Secondary | ICD-10-CM | POA: Diagnosis not present

## 2022-06-24 DIAGNOSIS — R002 Palpitations: Secondary | ICD-10-CM | POA: Diagnosis not present

## 2022-06-24 DIAGNOSIS — R5383 Other fatigue: Secondary | ICD-10-CM | POA: Diagnosis not present

## 2022-06-24 DIAGNOSIS — I480 Paroxysmal atrial fibrillation: Secondary | ICD-10-CM

## 2022-06-24 DIAGNOSIS — I951 Orthostatic hypotension: Secondary | ICD-10-CM | POA: Insufficient documentation

## 2022-06-24 DIAGNOSIS — E271 Primary adrenocortical insufficiency: Secondary | ICD-10-CM | POA: Insufficient documentation

## 2022-06-24 DIAGNOSIS — I4719 Other supraventricular tachycardia: Secondary | ICD-10-CM | POA: Insufficient documentation

## 2022-06-24 DIAGNOSIS — E876 Hypokalemia: Secondary | ICD-10-CM | POA: Diagnosis not present

## 2022-06-24 DIAGNOSIS — E039 Hypothyroidism, unspecified: Secondary | ICD-10-CM | POA: Diagnosis not present

## 2022-06-24 DIAGNOSIS — R001 Bradycardia, unspecified: Secondary | ICD-10-CM | POA: Insufficient documentation

## 2022-06-24 DIAGNOSIS — R0609 Other forms of dyspnea: Secondary | ICD-10-CM | POA: Diagnosis not present

## 2022-06-24 DIAGNOSIS — R55 Syncope and collapse: Secondary | ICD-10-CM

## 2022-06-24 MED ORDER — FUROSEMIDE 20 MG PO TABS: 20.0000 mg | ORAL_TABLET | Freq: Every day | ORAL | 3 refills | Status: DC

## 2022-06-24 MED ORDER — POTASSIUM CHLORIDE CRYS ER 20 MEQ PO TBCR: 60.0000 meq | EXTENDED_RELEASE_TABLET | Freq: Every day | ORAL | 3 refills | Status: DC

## 2022-06-24 MED ORDER — FUROSEMIDE 20 MG PO TABS: 60.0000 mg | ORAL_TABLET | Freq: Every day | ORAL | 3 refills | Status: DC

## 2022-06-24 NOTE — Progress Notes (Unsigned)
Enrolled patient for a 14 day Zio XT monitor to be mailed to patients home   Dr Jordan to read 

## 2022-06-24 NOTE — Patient Instructions (Addendum)
Medication Instructions:  STOP Metoprolol Succinate (Toprol-XL)   *If you need a refill on your cardiac medications before your next appointment, please call your pharmacy*  Lab Work: NONE ordered at this time of appointment   If you have labs (blood work) drawn today and your tests are completely normal, you will receive your results only by: MyChart Message (if you have MyChart) OR A paper copy in the mail If you have any lab test that is abnormal or we need to change your treatment, we will call you to review the results.  Testing/Procedures: Angela Duke, PA-C has requested that you have an echocardiogram. Echocardiography is a painless test that uses sound waves to create images of your heart. It provides your doctor with information about the size and shape of your heart and how well your heart's chambers and valves are working. This procedure takes approximately one hour. There are no restrictions for this procedure. Please do NOT wear cologne, perfume, aftershave, or lotions (deodorant is allowed). Please arrive 15 minutes prior to your appointment time.  ZIO AT Long term monitor-Live Telemetry  Angela Duke, PA-C has requested you wear a ZIO patch monitor for 14 days.  This is a single patch monitor. [employer] supplies one patch monitor per enrollment. Additional  stickers are not available.  Please do not apply patch if you will be having a Nuclear Stress Test, Echocardiogram, Cardiac CT, MRI,  or Chest Xray during the period you would be wearing the monitor. The patch cannot be worn during  these tests. You cannot remove and re-apply the ZIO AT patch monitor.  Your ZIO patch monitor will be mailed 3 day USPS to your address on file. It may take 3-5 days to  receive your monitor after you have been enrolled.  Once you have received your monitor, please review the enclosed instructions. Your monitor has  already been registered assigning a specific monitor serial # to you.    Billing and Patient Assistance Program information  [employer] has been supplied with any insurance information on record for billing. [employer] offers a sliding scale Patient Assistance Program for patients without insurance, or whose  insurance does not completely cover the cost of the ZIO patch monitor. You must apply for the  Patient Assistance Program to qualify for the discounted rate. To apply, call [employer] at [phone],  select option 4, select option 2 , ask to apply for the Patient Assistance Program, (you can request an  interpreter if needed). [employer] will ask your household income and how many people are in your  household. [employer] will quote your out-of-pocket cost based on this information. They will also be able  to set up a 12 month interest free payment plan if needed.  Applying the monitor   Shave hair from upper left chest.  Hold the abrader disc by orange tab. Rub the abrader in 40 strokes over left upper chest as indicated in  your monitor instructions.  Clean area with 4 enclosed alcohol pads. Use all pads to ensure the area is cleaned thoroughly. Let  dry.  Apply patch as indicated in monitor instructions. Patch will be placed under collarbone on left side of  chest with arrow pointing upward.  Rub patch adhesive wings for 2 minutes. Remove the white label marked "1". Remove the white label  marked "2". Rub patch adhesive wings for 2 additional minutes.  While looking in a mirror, press and release button in center of patch. A small green light will flash   3-4  times. This will be your only indicator that the monitor has been turned on.  Do not shower for the first 24 hours. You may shower after the first 24 hours.  Press the button if you feel a symptom. You will hear a small click. Record Date, Time and Symptom in  the Patient Log.   Starting the Gateway  In your kit there is a small plastic box the size of a cellphone. This is your Gateway. It transmits all  your  recorded data to [employer]. This box must always stay within 10 feet of you. Open the box and push the *  button. There will be a light that blinks orange and then green a few times. When the light stops  blinking, the Gateway is connected to the ZIO patch. Call [employer] at [phone] to confirm your monitor is transmitting.  Returning your monitor  Remove your patch and place it inside the [place]. In the lower half of the Gateway there is a white  bag with prepaid postage on it. Place Gateway in bag and seal. Mail package back to [employer] as soon as  possible. Your physician should have your final report approximately 7 days after you have mailed back  your monitor. Call [employer] Customer Care at [phone] if you have questions regarding your ZIO AT  patch monitor. Call them immediately if you see an orange light blinking on your monitor.  If your monitor falls off in less than 4 days, contact our Monitor department at [phone]. If your  monitor becomes loose or falls off after 4 days call [employer] at [phone] for suggestions on  securing your monitor  Follow-Up: At  [facility], you and your health needs are our priority.  As part of our continuing mission to provide you with exceptional heart care, we have created designated Provider Care Teams.  These Care Teams include your primary Cardiologist (physician) and Advanced Practice Providers (APPs -  Physician Assistants and Nurse Practitioners) who all work together to provide you with the care you need, when you need it.   Your next appointment:   1 month(s)  The format for your next appointment:   In Person  Provider:   Angela Duke, PA-C        Other Instructions Angela Duke, PA-C has requested that you wear an abdominal binder and thigh high compression sock.   Important Information About Sugar       

## 2022-06-24 NOTE — Progress Notes (Signed)
Chronic Care Management Pharmacy Assistant   Name: SIREEN HALK  MRN: [id number] DOB: [birth year]   Reason for Encounter: Disease State - Hypertension Call     Recent office visits:  06/21/22 Annye Asa, MD - Family Medicine - Fatigue - Labs were ordered. Metoprolol succinate (TOPROL-XL) 25 MG 24 hr tablet  prescribed.   Recent consult visits:  06/24/22 Fabian Sharp, [place] - Cardiology - Near Syncope - Long term monitor ordered. ECHO ordered. Metoprolol discontinued. furosemide (LASIX) 20 MG tablet and potassium chloride SA (KLOR-CON M) 20 MEQ tablet  prescribed.   Hospital visits: 06/19/22 - 06/20/22 Medication Reconciliation was completed by comparing discharge summary, patient's EMR and Pharmacy list, and upon discussion with patient.  Admitted to the hospital on 06/19/22 due to Dehydration. Discharge date was 06/20/22. Discharged from [place]?Medications Started at [facility] Discharge:?? None noted.  Medication Changes at Hospital Discharge: None noted.  Medications Discontinued at Hospital Discharge: None noted.   Medications that remain the same after Hospital Discharge:??  All other medications will remain the same.    Medications: Outpatient Encounter Medications as of 06/24/2022  Medication Sig   Acetaminophen (TYLENOL ARTHRITIS PAIN PO) Take 325 mg by mouth daily as needed (FOR MILD PAIN).   amitriptyline (ELAVIL) 50 MG tablet Take one tablet at bedtime   atorvastatin (LIPITOR) 20 MG tablet TAKE 1 TABLET(20 MG) BY MOUTH DAILY   famotidine (PEPCID) 40 MG tablet Take 1 tablet (40 mg total) by mouth daily.   fludrocortisone (FLORINEF) 0.1 MG tablet Take one tablet daily   furosemide (LASIX) 20 MG tablet Take 1 tablet (20 mg total) by mouth daily.   hydrocortisone (CORTEF) 10 MG tablet Take 10 mg by mouth daily.   hydroxypropyl methylcellulose / hypromellose (ISOPTO TEARS / GONIOVISC) 2.5 % ophthalmic solution Place 2 drops into both eyes  daily as needed for dry eyes.   levothyroxine (SYNTHROID) 50 MCG tablet Take 50 mcg by mouth every morning.   potassium chloride SA (KLOR-CON M) 20 MEQ tablet Take 3 tablets (60 mEq total) by mouth daily.   No facility-administered encounter medications on file as of 06/24/2022.    Current antihypertensive regimen:  Furosemide  20 mg 1 tablet daily  How often are you checking your Blood Pressure?  Patient reported checking blood pressures  Current home BP readings: 110/64 (today at cardiologist) dropped to 74/56 when she stood at the cardiologist office.     What recent interventions/DTPs have been made by any provider to improve Blood Pressure control since last CPP Visit:  Metoprolol was discontinued due to hypotension and near syncope episodes.    Any recent hospitalizations or ED visits since last visit with CPP? Patient had hospitalization on 06/19/22 for Dehydration.   What diet changes have been made to improve Blood Pressure Control?  Patient has not needed to be on a low sodium diet due to recent near syncope and episodes of hypotension and dizziness.    What exercise is being done to improve your Blood Pressure Control?   Patient reports she tries to be as active as possible.    Adherence Review: Is the patient currently on ACE/ARB medication? No Does the patient have >5 day gap between last estimated fill dates? No     Care Gaps   AWV: done 11/11/21 Colonoscopy: done 06/01/17 DM Eye Exam: N/A DM Foot Exam: N/A Microalbumin: N/A HbgAIC: N/A DEXA: done 05/30/15 Mammogram: done 10/21/21     Star Rating Drugs:  Atorvastatin (LIPITOR) 20 MG tablet - last filled 04/12/22 90 days    Future Appointments  Date Time Provider [place]  07/12/2022  8:40 AM Midge Minium, MD [facility]  07/18/2022  8:30 AM [facility] ECHO 4 [id number]  08/01/2022  8:25 AM [facility], Tami Lin, PA [facility] None  09/28/2022  2:00 PM [facility] PHARMACIST  [facility]  11/17/2022  8:15 AM [facility] HEALTH COACH [facility] [place], [place] Clinical Pharmacist Assistant  [phone]

## 2022-06-28 DIAGNOSIS — R55 Syncope and collapse: Secondary | ICD-10-CM

## 2022-06-28 DIAGNOSIS — I480 Paroxysmal atrial fibrillation: Secondary | ICD-10-CM | POA: Diagnosis not present

## 2022-06-29 DIAGNOSIS — M810 Age-related osteoporosis without current pathological fracture: Secondary | ICD-10-CM | POA: Diagnosis not present

## 2022-06-29 DIAGNOSIS — E2740 Unspecified adrenocortical insufficiency: Secondary | ICD-10-CM | POA: Diagnosis not present

## 2022-06-29 DIAGNOSIS — E039 Hypothyroidism, unspecified: Secondary | ICD-10-CM | POA: Diagnosis not present

## 2022-06-29 DIAGNOSIS — I480 Paroxysmal atrial fibrillation: Secondary | ICD-10-CM | POA: Diagnosis not present

## 2022-06-29 DIAGNOSIS — R55 Syncope and collapse: Secondary | ICD-10-CM | POA: Diagnosis not present

## 2022-06-30 ENCOUNTER — Other Ambulatory Visit: Payer: Self-pay | Admitting: *Deleted

## 2022-06-30 DIAGNOSIS — R55 Syncope and collapse: Secondary | ICD-10-CM

## 2022-07-01 DIAGNOSIS — E2740 Unspecified adrenocortical insufficiency: Secondary | ICD-10-CM | POA: Diagnosis not present

## 2022-07-12 ENCOUNTER — Encounter: Payer: Self-pay | Admitting: Family Medicine

## 2022-07-12 ENCOUNTER — Ambulatory Visit (INDEPENDENT_AMBULATORY_CARE_PROVIDER_SITE_OTHER): Payer: Medicare Other | Admitting: Family Medicine

## 2022-07-12 VITALS — BP 98/60 | HR 80 | Temp 98.9°F | Resp 16 | Ht 62.0 in | Wt 105.2 lb

## 2022-07-12 DIAGNOSIS — F32A Depression, unspecified: Secondary | ICD-10-CM

## 2022-07-12 DIAGNOSIS — F419 Anxiety disorder, unspecified: Secondary | ICD-10-CM

## 2022-07-12 DIAGNOSIS — I951 Orthostatic hypotension: Secondary | ICD-10-CM

## 2022-07-12 MED ORDER — ALPRAZOLAM 0.25 MG PO TABS: 0.2500 mg | ORAL_TABLET | Freq: Two times a day (BID) | ORAL | 0 refills | Status: DC | PRN

## 2022-07-12 MED ORDER — SERTRALINE HCL 25 MG PO TABS: 25.0000 mg | ORAL_TABLET | Freq: Every day | ORAL | 3 refills | Status: DC

## 2022-07-12 NOTE — Patient Instructions (Signed)
Follow up in 4-6 weeks to recheck anxiety START the Sertraline once daily to help w/ anxiety and mood USE the Alprazolam only as needed for high stress or panicked moments Continue to drink LOTS of fluids and change positions slowly Make sure you are using the walker for support Call with any questions or concerns Stay Safe!  Stay Healthy! Happy Holidays!!

## 2022-07-12 NOTE — Assessment & Plan Note (Signed)
Deteriorated.  Husband reports pt's 'nerves' have been really bad recently.  Pt admits she worries, husband says 'about everything'.  Discussed w/ pt and husband that since her anxiety is a daily occurrence, a rescue/prn medication is not the best solution.  Pt needs a daily medication to lower baseline anxiety/stress and then a rescue medication for panicked moments.  They both agreed that this was a better plan.  Will start Sertraline '25mg'$  daily and Alprazolam 0.'25mg'$  PRN.  Pt has taken Alprazolam in the past w/o difficulty.  Will follow closely.

## 2022-07-12 NOTE — Progress Notes (Signed)
   Subjective:    Patient ID: Kaitlyn Adams, female    DOB: 25-Dec-1950, 71 y.o.   MRN: [id number]  HPI Hypotension- at last visit I decreased Metoprolol to 1/2 tab daily and then cardiology stopped it entirely.  Pt finds that she is tiring easily.  No falls.  Anxiety- husband reports she is very anxious and 'worries all the time'  Husband feels that her shaky feelings are due to nerves.  Son has to have a nephrectomy and pt is very stressed about this.  He feels it's time for medication and she agrees.  Would like to take something as needed.   Review of Systems For ROS see HPI     Objective:   Physical Exam Vitals reviewed.  Constitutional:      General: She is not in acute distress.    Appearance: Normal appearance. She is well-developed. She is not ill-appearing.  HENT:     Head: Normocephalic and atraumatic.  Eyes:     Conjunctiva/sclera: Conjunctivae normal.     Pupils: Pupils are equal, round, and reactive to light.  Neck:     Thyroid: No thyromegaly.  Cardiovascular:     Rate and Rhythm: Normal rate and regular rhythm.     Heart sounds: Normal heart sounds. No murmur heard. Pulmonary:     Effort: Pulmonary effort is normal. No respiratory distress.     Breath sounds: Normal breath sounds.  Abdominal:     General: There is no distension.     Palpations: Abdomen is soft.     Tenderness: There is no abdominal tenderness.  Musculoskeletal:     Cervical back: Normal range of motion and neck supple.     Right lower leg: No edema.     Left lower leg: No edema.  Lymphadenopathy:     Cervical: No cervical adenopathy.  Skin:    General: Skin is warm and dry.  Neurological:     General: No focal deficit present.     Mental Status: She is alert and oriented to person, place, and time.  Psychiatric:        Mood and Affect: Mood normal.        Behavior: Behavior normal.        Thought Content: Thought content normal.           Assessment & Plan:

## 2022-07-12 NOTE — Assessment & Plan Note (Signed)
Pt's HR is much improved today since stopping Metoprolol.  Last visit HR 51 and today 80.  BP remains low but pt is off all BP meds.  Encouraged increased water intake and changing positions slowly.  Will continue to follow.

## 2022-07-18 ENCOUNTER — Ambulatory Visit (HOSPITAL_COMMUNITY): Payer: Medicare Other | Attending: Physician Assistant

## 2022-07-18 DIAGNOSIS — I340 Nonrheumatic mitral (valve) insufficiency: Secondary | ICD-10-CM | POA: Insufficient documentation

## 2022-07-18 DIAGNOSIS — I480 Paroxysmal atrial fibrillation: Secondary | ICD-10-CM | POA: Insufficient documentation

## 2022-07-18 LAB — ECHOCARDIOGRAM COMPLETE
Area-P 1/2: 3.77 cm2
S' Lateral: 2 cm

## 2022-07-22 ENCOUNTER — Other Ambulatory Visit: Payer: Self-pay | Admitting: *Deleted

## 2022-07-22 DIAGNOSIS — I340 Nonrheumatic mitral (valve) insufficiency: Secondary | ICD-10-CM

## 2022-07-26 NOTE — Progress Notes (Signed)
Cardiology Office Note:    Date:  08/01/2022   ID:  Kaitlyn Adams, Kaitlyn Adams 1950-09-21, MRN [id number]  PCP:  Midge Minium, MD   [place] Providers Cardiologist:  Peter Martinique, MD Cardiology APP:  Ledora Bottcher, PA   Referring MD: Midge Minium, MD   Chief Complaint  Patient presents with   Follow-up    Orthostatic hypotension    History of Present Illness:    Kaitlyn Adams is a 71 y.o. female with a hx of PAT, PAF , HTN, HLD, hyperthyroidism s/p thyroidectomy, Addison's disease on Florinef and hydrocortisone, asthma, GERD, fibromyalgia, nonobstructive CAD by LHC, and syncope.  She has chronic dyspnea on exertion and increase in palpitations when she has run out of her beta-blocker.  She was last seen by Caron Presume, PA-C 07/01/2021.  She has increased palpitations when she is physically or mentally stressed.  I also take care of her husband.  I saw her for hospital follow-up 07/21/2022 following a syncopal event.  She was seen in the ER 06/19/2022 after an episode of near syncope and lightheadedness causing her to lose balance and fall over in the past few days.  She has had multiple episodes.  She had not taken her potassium supplement in 2 days.  She was afebrile and not tachycardic or tachypneic and well-appearing.  She did have a white count of 19.1 but is on chronic steroids for Addison's.  UA negative.  She was hypokalemic at 2.1 with evidence of AKI with a creatinine 2.21 from a baseline of 1.1.  She also reported poor p.o. intake.  She was temporized and fluid resuscitated.  She was ultimately discharged without admission.  She was advised to follow-up with cardiology.  PCP reduced toprol to 12.5 mg for bradycardia and hypotension.  She has significant orthostatic hypotension with SBP dropping from 134 lying to 76 standing.  I discontinued Toprol altogether.    Due to complaints of worsening dyspnea on exertion and recent syncope, I  placed a heart monitor and obtain an echocardiogram.  Echocardiogram was largely unremarkable.  Heart monitor did not show any A-fib or flutter, no heart block or sustained pauses.  She presents today for follow-up of the studies. Overall, her dizziness is improving. She still gets dizzy upon standing, but now has a walker close by. No further syncope or falls. She does describe chest wall pain consistent with MSK pain, no exertional symptoms. BP on the higher side, but I think this should be her goal given proclivity for orthostatic hypotension and falls.     Past Medical History:  Diagnosis Date   Addison disease ([place])    Anemia    Asthma    - normal spirometry 2009 FEV1  >90% predicted.  - methacoline challenge neg 11/09   Bronchitis    recurrent   Cataract    Chronic cough    - sinus CT 03-23-10 > neg.  - allergy profile 03-23-10 > nl, IgE 14.  - flutter valve rx 03-23-10   Fibromyalgia    GERD (gastroesophageal reflux disease)    exacerbating VCD   Glaucoma    History of blood transfusion    Hyperlipidemia    Hypertension    Hyperthyroidism    with hot nodule.  - total thyroidectomy 11-05-08 benign -> subsequent hypothyroidism.   Hypokalemia    Hypothyroidism    Hypothyroidism    a. following thyroidectomy.   Mild CAD    a. LHC 04/2015 - mild  nonobstructive CAD with 30% dLAD, 40% D1, 25% pLCx, 30% mRCA, 30% RPDA, normal EF >65% with normal LVEDP.   Orthostatic hypotension    Paroxysmal atrial fibrillation (HCC)    Renal insufficiency    Vasomotor rhinitis    exacerbates VCD   Vocal cord dysfunction    proven on FOB 9/09    Past Surgical History:  Procedure Laterality Date   ABDOMINAL HYSTERECTOMY  1980   ABDOMINAL HYSTERECTOMY     BASAL CELL CARCINOMA EXCISION  11/08   CARDIAC CATHETERIZATION N/A 05/15/2015   Procedure: Left Heart Cath and Coronary Angiography;  Surgeon: Sherren Mocha, MD;  Location: [place] [facility];  Service: Cardiovascular;  Laterality: N/A;    COLONOSCOPY N/A 04/27/2017   Dr Silverio Decamp for scant rectal bleeding and iron def anemia: Melanosis coli, lipomatous IC valve, left sided tics, non-bleeding hemorrhoid, 10 mm transverse polyp.     ESOPHAGOGASTRODUODENOSCOPY N/A 04/27/2017   Mauri Pole, MD; [facility] ENDOSCOPY. for iron def anemia.  Monilial/candidial esophagitis. Small HH.     EYE SURGERY     scar tissue removal  1982   THYROID SURGERY  2010   THYROIDECTOMY  11-05-08    Current Medications: No outpatient medications have been marked as taking for the 08/01/22 encounter (Office Visit) with Ledora Bottcher, [place].     Allergies:   Fenofibrate, Erythromycin, Guaifenesin er, and Sulfonamide derivatives   Social History   Socioeconomic History   Marital status: Married    Spouse name: Not on file   Number of children: 1   Years of education: Not on file   Highest education level: Not on file  Occupational History   Occupation: owns a plumbing business   Occupation: OWNER    Employer: [employer] Use   Smoking status: Never   Smokeless tobacco: Never  Vaping Use   Vaping Use: Never used  Substance and Sexual Activity   Alcohol use: Yes    Alcohol/week: 1.0 standard drink of alcohol    Types: 1 Glasses of wine per week    Comment: occasional wine or beer   Drug use: No   Sexual activity: Yes    Birth control/protection: None  Other Topics Concern   Not on file  Social History Narrative   Not on file   Social Determinants of Health   Financial Resource Strain: Low Risk  (11/11/2021)   Overall Financial Resource Strain (CARDIA)    Difficulty of Paying Living Expenses: Not hard at all  Food Insecurity: No Food Insecurity (11/11/2021)   Hunger Vital Sign    Worried About Running Out of Food in the Last Year: Never true    Ran Out of Food in the Last Year: Never true  Transportation Needs: No Transportation Needs (11/11/2021)   PRAPARE - Hydrologist (Medical):  No    Lack of Transportation (Non-Medical): No  Physical Activity: Insufficiently Active (11/11/2021)   Exercise Vital Sign    Days of Exercise per Week: 1 day    Minutes of Exercise per Session: 10 min  Stress: No Stress Concern Present (10/12/2020)   [place]    Feeling of Stress : Only a little  Social Connections: Moderately Isolated (11/11/2021)   Social Connection and Isolation Panel [NHANES]    Frequency of Communication with Friends and Family: Twice a week    Frequency of Social Gatherings with Friends and Family: Twice a week  Attends Religious Services: Never    Active Member of Clubs or Organizations: No    Attends Archivist Meetings: Never    Marital Status: Married     Family History: The patient's family history includes Asthma in her grandchild and mother; Emphysema in her father; Heart disease in her father and mother; Irregular heart beat in her brother.  ROS:   Please see the history of present illness.     All other systems reviewed and are negative.  EKGs/Labs/Other Studies Reviewed:    The following studies were reviewed today:  Heart monitor 07/22/22: Patient had a min HR of 66 bpm, max HR of 200 bpm, and avg HR of 81 bpm. Predominant underlying rhythm was Sinus Rhythm. Bundle Branch Block/IVCD was present. 63 Supraventricular Tachycardia runs occurred, the run with the fastest interval lasting 5  beats with a max rate of 200 bpm, the longest lasting 19 beats with an avg rate of 132 bpm. Isolated SVEs were rare (<1.0%), SVE Couplets were rare (<1.0%), and SVE Triplets were rare (<1.0%). Isolated VEs were rare (<1.0%), VE Couplets were rare  (<1.0%), and no VE Triplets were present.   Echo 07/18/22:  1. Left ventricular ejection fraction, by estimation, is 60 to 65%. Left  ventricular ejection fraction by 3D volume is 58 %. The left ventricle has  normal function. The left  ventricle has no regional wall motion  abnormalities. Left ventricular diastolic   parameters were normal.   2. Right ventricular systolic function is normal. The right ventricular  size is normal. There is normal pulmonary artery systolic pressure. The  estimated right ventricular systolic pressure is 16.1 mmHg.   3. The mitral valve is normal in structure. Mild mitral valve  regurgitation. No evidence of mitral stenosis.   4. The aortic valve is normal in structure. Aortic valve regurgitation is  not visualized. No aortic stenosis is present.   5. The inferior vena cava is normal in size with greater than 50%  respiratory variability, suggesting right atrial pressure of 3 mmHg.   6. No significant change compared to study 05/26/2020    EKG:  EKG is not ordered today.    Recent Labs: 06/21/2022: ALT 12; BUN 22; Creatinine, Ser 1.63; Hemoglobin 11.7; Magnesium 1.9; Platelets 527.0; Potassium 3.1; Sodium 131; TSH 1.11  Recent Lipid Panel    Component Value Date/Time   CHOL 183 11/06/2020 1105   TRIG 231.0 (H) 11/06/2020 1105   HDL 48.20 11/06/2020 1105   CHOLHDL 4 11/06/2020 1105   VLDL 46.2 (H) 11/06/2020 1105   LDLCALC 111 (H) 04/07/2020 0915   LDLDIRECT 98.0 11/06/2020 1105     Risk Assessment/Calculations:    CHA2DS2-VASc Score = 3   This indicates a 3.2% annual risk of stroke. The patient's score is based upon: CHF History: 0 HTN History: 1 Diabetes History: 0 Stroke History: 0 Vascular Disease History: 0 Age Score: 1 Gender Score: 1       Physical Exam:    VS:  BP (!) 142/70   Pulse 81   Ht '5\' 3"'$  (1.6 m)   Wt 105 lb 3.2 oz (47.7 kg)   SpO2 97%   BMI 18.64 kg/m     Wt Readings from Last 3 Encounters:  08/01/22 105 lb 3.2 oz (47.7 kg)  07/12/22 105 lb 4 oz (47.7 kg)  06/24/22 101 lb 3.2 oz (45.9 kg)     GEN:  Well nourished, well developed in no acute distress HEENT: Normal NECK: No  JVD; No carotid bruits LYMPHATICS: No lymphadenopathy CARDIAC: RRR,  no murmurs, rubs, gallops RESPIRATORY:  Clear to auscultation without rales, wheezing or rhonchi  ABDOMEN: Soft, non-tender, non-distended MUSCULOSKELETAL:  No edema; No deformity  SKIN: Warm and dry NEUROLOGIC:  Alert and oriented x 3 PSYCHIATRIC:  Normal affect   ASSESSMENT:    1. Near syncope   2. Other fatigue   3. Bradycardia   4. Orthostatic hypotension   5. Hypokalemia   6. DOE (dyspnea on exertion)   7. Palpitations   8. PAT (paroxysmal atrial tachycardia)   9. Paroxysmal atrial fibrillation (HCC)   10. Renal insufficiency   11. Primary hypertension   12. Hyperlipidemia with target LDL less than 70   13. Addison's disease ([place])   14. Hypothyroidism, unspecified type    PLAN:    In order of problems listed above:  Near syncope Fatigue  Bradycardia Hypotension  Orthostatic hypotension In the setting of hypokalemia and poor PO intake, but also bradycardic with low BP.  Toprol was discontinued at last clinic visit.  I also recommended thigh-high compression stockings and abdominal binder.  I did not start midodrine.  She is already on Florinef and I would like endocrinology to weigh in.  Of note, droxidopa could be a good option but may exacerbate arrhythmias.  Midodrine interacts with levothyroxine and Florinef.  Heart monitor did show normal sinus rhythm with occasional short runs of SVT but no sustained episodes and no atrial fibrillation or flutter.  Echo largely unremarkable.   - orthostatic dizziness is improving off of toprol - she questions if some of her symptoms are due to Addison's  We reviewed her studies and I think she is doing well from a cardiovascular standpoint. Thankfully she has upcoming appt with PCP and endocrinology.   Shortness of breath with exertion She is a chronic degree of DOE but states that it has been worse over the last several weeks.  This is also in the setting of increased stress.  I opted to repeat an echocardiogram that was  largely unremarkable.  She is euvolemic on exam.    Palpitations Paroxysmal atrial fibrillation Paroxysmal atrial tachycardia She is not anticoagulated Toprol DC'd Heart monitor did not show A-fib or flutter No further palpitations She remains on digoxin   AKI sCr was improving - component of hypotension? I would like to recheck her labs - she states she will have labs drawn next week with Dr. Birdie Riddle - I will look for these   Hypokalemia She is supposed to be taking 60 mEq potassium At the time of her hospitalization with syncope she was vomiting and could not keep down her potassium supplement - recheck BMP next week with PCP   Hypertension On no agents BP 142/70 I think her goal should be 048-889 systolic    Hyperlipidemia Maintained on 20 mg Lipitor Will get updated lipid panel with PCP next week   Addison's disease Hypothyroidism Hydrocortisone, florinef, synthroid Following with endocrinology   Follow up in 4-5 months.     Medication Adjustments/Labs and Tests Ordered: Current medicines are reviewed at length with the patient today.  Concerns regarding medicines are outlined above.  No orders of the defined types were placed in this encounter.  No orders of the defined types were placed in this encounter.   Patient Instructions  Medication Instructions:  No Changes *If you need a refill on your cardiac medications before your next appointment, please call your pharmacy*   Lab Work: No Labs  If you have labs (blood work) drawn today and your tests are completely normal, you will receive your results only by: [place] (if you have MyChart) OR A paper copy in the mail If you have any lab test that is abnormal or we need to change your treatment, we will call you to review the results.   Testing/Procedures: No Testing   Follow-Up: At [facility], you and your health needs are our priority.  As part of our continuing mission to  provide you with exceptional heart care, we have created designated Provider Care Teams.  These Care Teams include your primary Cardiologist (physician) and Advanced Practice Providers (APPs -  Physician Assistants and Nurse Practitioners) who all work together to provide you with the care you need, when you need it.  We recommend signing up for the patient portal called "MyChart".  Sign up information is provided on this After Visit Summary.  MyChart is used to connect with patients for Virtual Visits (Telemedicine).  Patients are able to view lab/test results, encounter notes, upcoming appointments, etc.  Non-urgent messages can be sent to your provider as well.   To learn more about what you can do with MyChart, go to [email or web address].    Your next appointment:   4-5 month(s)  The format for your next appointment:   In Person  Provider:   Peter Martinique, MD     Signed, Kaitlyn Adams, Kaitlyn Adams  08/01/2022 8:49 AM    [place]

## 2022-07-27 ENCOUNTER — Other Ambulatory Visit: Payer: Self-pay

## 2022-07-27 MED ORDER — FAMOTIDINE 40 MG PO TABS: 40.0000 mg | ORAL_TABLET | Freq: Every day | ORAL | 3 refills | Status: DC

## 2022-08-01 ENCOUNTER — Ambulatory Visit: Payer: Medicare Other | Attending: Physician Assistant | Admitting: Physician Assistant

## 2022-08-01 ENCOUNTER — Encounter: Payer: Self-pay | Admitting: Physician Assistant

## 2022-08-01 VITALS — BP 142/70 | HR 81 | Ht 63.0 in | Wt 105.2 lb

## 2022-08-01 DIAGNOSIS — R5383 Other fatigue: Secondary | ICD-10-CM | POA: Diagnosis not present

## 2022-08-01 DIAGNOSIS — R001 Bradycardia, unspecified: Secondary | ICD-10-CM

## 2022-08-01 DIAGNOSIS — I480 Paroxysmal atrial fibrillation: Secondary | ICD-10-CM

## 2022-08-01 DIAGNOSIS — I951 Orthostatic hypotension: Secondary | ICD-10-CM

## 2022-08-01 DIAGNOSIS — R002 Palpitations: Secondary | ICD-10-CM

## 2022-08-01 DIAGNOSIS — R55 Syncope and collapse: Secondary | ICD-10-CM

## 2022-08-01 DIAGNOSIS — E271 Primary adrenocortical insufficiency: Secondary | ICD-10-CM | POA: Diagnosis not present

## 2022-08-01 DIAGNOSIS — E785 Hyperlipidemia, unspecified: Secondary | ICD-10-CM | POA: Diagnosis not present

## 2022-08-01 DIAGNOSIS — N289 Disorder of kidney and ureter, unspecified: Secondary | ICD-10-CM

## 2022-08-01 DIAGNOSIS — I4719 Other supraventricular tachycardia: Secondary | ICD-10-CM | POA: Diagnosis not present

## 2022-08-01 DIAGNOSIS — E039 Hypothyroidism, unspecified: Secondary | ICD-10-CM | POA: Diagnosis not present

## 2022-08-01 DIAGNOSIS — I10 Essential (primary) hypertension: Secondary | ICD-10-CM

## 2022-08-01 DIAGNOSIS — R0609 Other forms of dyspnea: Secondary | ICD-10-CM | POA: Diagnosis not present

## 2022-08-01 DIAGNOSIS — E876 Hypokalemia: Secondary | ICD-10-CM | POA: Diagnosis not present

## 2022-08-01 NOTE — Patient Instructions (Signed)
Medication Instructions:  No Changes *If you need a refill on your cardiac medications before your next appointment, please call your pharmacy*   Lab Work: No Labs If you have labs (blood work) drawn today and your tests are completely normal, you will receive your results only by: [place] (if you have MyChart) OR A paper copy in the mail If you have any lab test that is abnormal or we need to change your treatment, we will call you to review the results.   Testing/Procedures: No Testing   Follow-Up: At [facility], you and your health needs are our priority.  As part of our continuing mission to provide you with exceptional heart care, we have created designated Provider Care Teams.  These Care Teams include your primary Cardiologist (physician) and Advanced Practice Providers (APPs -  Physician Assistants and Nurse Practitioners) who all work together to provide you with the care you need, when you need it.  We recommend signing up for the patient portal called "MyChart".  Sign up information is provided on this After Visit Summary.  MyChart is used to connect with patients for Virtual Visits (Telemedicine).  Patients are able to view lab/test results, encounter notes, upcoming appointments, etc.  Non-urgent messages can be sent to your provider as well.   To learn more about what you can do with MyChart, go to [email or web address].    Your next appointment:   4-5 month(s)  The format for your next appointment:   In Person  Provider:   Peter Martinique, MD

## 2022-08-08 ENCOUNTER — Ambulatory Visit (INDEPENDENT_AMBULATORY_CARE_PROVIDER_SITE_OTHER): Payer: Medicare Other | Admitting: Family Medicine

## 2022-08-08 ENCOUNTER — Encounter: Payer: Self-pay | Admitting: Family Medicine

## 2022-08-08 VITALS — BP 112/68 | HR 85 | Temp 97.6°F | Resp 17 | Ht 63.0 in | Wt 105.1 lb

## 2022-08-08 DIAGNOSIS — M940 Chondrocostal junction syndrome [Tietze]: Secondary | ICD-10-CM

## 2022-08-08 DIAGNOSIS — F419 Anxiety disorder, unspecified: Secondary | ICD-10-CM

## 2022-08-08 DIAGNOSIS — F32A Depression, unspecified: Secondary | ICD-10-CM | POA: Diagnosis not present

## 2022-08-08 MED ORDER — NAPROXEN 500 MG PO TABS: 500.0000 mg | ORAL_TABLET | Freq: Two times a day (BID) | ORAL | 0 refills | Status: DC

## 2022-08-08 NOTE — Patient Instructions (Signed)
Follow up in 6 months to recheck blood pressure and cholesterol No need for labs today- yay!!! Continue the Sertraline daily Use the Alprazolam as needed Take the Naproxen twice daily x10 days- take w/ food- for your costochondritis Continue to drink LOTS of fluids Change positions slowly to allow yourself time to adjust Call with any questions or concerns Happy Holidays!!

## 2022-08-08 NOTE — Assessment & Plan Note (Signed)
Much improved since starting low dose Sertraline '25mg'$  daily.  She has not required any Alprazolam but does have it to use as needed.  She and husband feel that things are in a good place right now and are not interested in increasing dose or making changes.  Will continue to follow.

## 2022-08-08 NOTE — Progress Notes (Signed)
   Subjective:    Patient ID: Kaitlyn Adams, female    DOB: 04-Feb-1951, 71 y.o.   MRN: [id number]  HPI Anxiety/Depression- at last visit we started Sertraline '25mg'$  daily and Alprazolam 0.'25mg'$  PRN.  Pt reports anxiety is improving.  Husband agrees.  'i haven't had any break downs'.  Is less tearful, less overwhelmed.  Pt feels current dose is appropriate.  Pt has not required Alprazolam.  Pt reports feeling 'better'.    Costochondritis- pt reports she has hx of this.  Chest is TTP and is uncomfortable to take a deep breath.  Sxs started ~1 week ago.  Pt denies any change in activity level, no recent cough.   Review of Systems For ROS see HPI     Objective:   Physical Exam Vitals reviewed.  Constitutional:      General: She is not in acute distress.    Appearance: Normal appearance. She is not ill-appearing.  HENT:     Head: Normocephalic and atraumatic.  Cardiovascular:     Rate and Rhythm: Normal rate and regular rhythm.  Pulmonary:     Effort: Pulmonary effort is normal. No respiratory distress.     Breath sounds: Normal breath sounds. No wheezing.  Chest:     Chest wall: Tenderness (TTP over L anterior chest wall) present.  Musculoskeletal:     Right lower leg: No edema.     Left lower leg: No edema.  Skin:    General: Skin is warm and dry.  Neurological:     General: No focal deficit present.     Mental Status: She is alert and oriented to person, place, and time.  Psychiatric:        Mood and Affect: Mood normal.        Behavior: Behavior normal.        Thought Content: Thought content normal.           Assessment & Plan:  Costochondritis- new.  Pt reports she has hx of similar and this feels like it did then.  Pain is reproducible over L anterior chest wall.  Denies any change in activity level recently and no recent coughing.  Since she is not on blood thinners, will treat w/ short course of Naproxen '500mg'$  BID.  Reviewed supportive care and red flags that  should prompt return.  Pt expressed understanding and is in agreement w/ plan.

## 2022-08-16 DIAGNOSIS — E876 Hypokalemia: Secondary | ICD-10-CM | POA: Diagnosis not present

## 2022-08-16 DIAGNOSIS — M899 Disorder of bone, unspecified: Secondary | ICD-10-CM | POA: Diagnosis not present

## 2022-08-16 DIAGNOSIS — E785 Hyperlipidemia, unspecified: Secondary | ICD-10-CM | POA: Diagnosis not present

## 2022-08-16 DIAGNOSIS — K219 Gastro-esophageal reflux disease without esophagitis: Secondary | ICD-10-CM | POA: Diagnosis not present

## 2022-08-16 DIAGNOSIS — E039 Hypothyroidism, unspecified: Secondary | ICD-10-CM | POA: Diagnosis not present

## 2022-08-16 DIAGNOSIS — M810 Age-related osteoporosis without current pathological fracture: Secondary | ICD-10-CM | POA: Diagnosis not present

## 2022-08-16 DIAGNOSIS — E2740 Unspecified adrenocortical insufficiency: Secondary | ICD-10-CM | POA: Diagnosis not present

## 2022-08-16 DIAGNOSIS — E559 Vitamin D deficiency, unspecified: Secondary | ICD-10-CM | POA: Diagnosis not present

## 2022-09-07 ENCOUNTER — Encounter: Payer: Self-pay | Admitting: Family Medicine

## 2022-09-07 ENCOUNTER — Ambulatory Visit (INDEPENDENT_AMBULATORY_CARE_PROVIDER_SITE_OTHER): Payer: Medicare Other | Admitting: Family Medicine

## 2022-09-07 VITALS — BP 118/74 | HR 86 | Temp 98.2°F | Wt 103.4 lb

## 2022-09-07 DIAGNOSIS — B9689 Other specified bacterial agents as the cause of diseases classified elsewhere: Secondary | ICD-10-CM | POA: Diagnosis not present

## 2022-09-07 DIAGNOSIS — J329 Chronic sinusitis, unspecified: Secondary | ICD-10-CM | POA: Diagnosis not present

## 2022-09-07 MED ORDER — AMOXICILLIN 875 MG PO TABS: 875.0000 mg | ORAL_TABLET | Freq: Two times a day (BID) | ORAL | 0 refills | Status: AC

## 2022-09-07 NOTE — Progress Notes (Signed)
   Subjective:    Patient ID: Kaitlyn Adams, female    DOB: [birth year], 72 y.o.   MRN: [id number]  HPI URI- 'i think it is a sinus infection'.  + HA, sinus pressure, R ear pressure.  + facial pain.  Sxs started ~10 days ago.  Acutely worsened yesterday.  + SOB.     Review of Systems For ROS see HPI     Objective:   Physical Exam Vitals reviewed.  Constitutional:      General: She is not in acute distress.    Appearance: Normal appearance. She is well-developed.  HENT:     Head: Normocephalic and atraumatic.     Right Ear: Tympanic membrane normal.     Left Ear: Tympanic membrane normal.     Nose: Mucosal edema and congestion present. No rhinorrhea.     Right Sinus: Maxillary sinus tenderness and frontal sinus tenderness present.     Left Sinus: Maxillary sinus tenderness and frontal sinus tenderness present.     Mouth/Throat:     Pharynx: Uvula midline. Posterior oropharyngeal erythema present. No oropharyngeal exudate.  Eyes:     Conjunctiva/sclera: Conjunctivae normal.     Pupils: Pupils are equal, round, and reactive to light.  Cardiovascular:     Rate and Rhythm: Normal rate and regular rhythm.     Heart sounds: Normal heart sounds.  Pulmonary:     Effort: Pulmonary effort is normal. No respiratory distress.     Breath sounds: Normal breath sounds. No wheezing.  Musculoskeletal:     Cervical back: Normal range of motion and neck supple.  Lymphadenopathy:     Cervical: No cervical adenopathy.  Skin:    General: Skin is warm and dry.  Neurological:     General: No focal deficit present.     Mental Status: She is alert and oriented to person, place, and time.     Cranial Nerves: No cranial nerve deficit.     Motor: No weakness.     Coordination: Coordination normal.  Psychiatric:        Mood and Affect: Mood normal.        Behavior: Behavior normal.        Thought Content: Thought content normal.           Assessment & Plan:  Bacterial sinusitis- pt has  hx of similar and reports this feels consistent w/ previous infxns.  Start Amoxicillin '875mg'$  BID x10 days.  If her SOB doesn't improve as infxn clears, she is to notify cardiology.  Pt expressed understanding and is in agreement w/ plan.

## 2022-09-07 NOTE — Patient Instructions (Signed)
Follow up as needed or as scheduled START the Amoxicillin twice daily- take w/ food REST! FLUIDS! If you continue to have shortness of breath once your infection clears, please let Cardiology know Call with any questions or concerns Hang in there!!! [place]!!!

## 2022-09-14 ENCOUNTER — Telehealth: Payer: Self-pay | Admitting: Cardiology

## 2022-09-14 MED ORDER — DIGOXIN 125 MCG PO TABS: 0.1250 mg | ORAL_TABLET | Freq: Every day | ORAL | 3 refills | Status: DC

## 2022-09-14 NOTE — Telephone Encounter (Signed)
Patient aware refill has been sent to the pharmacy electronically.

## 2022-09-14 NOTE — Telephone Encounter (Signed)
*  STAT* If patient is at the pharmacy, call can be transferred to refill team.   1. Which medications need to be refilled? (please list name of each medication and dose if known) digoxin (LANOXIN) 0.125 MG tablet   2. Which pharmacy/location (including street and city if local pharmacy) is medication to be sent to? [id number] - [place], [place] - [address]   3. Do they need a 30 day or 90 day supply? [place]

## 2022-09-20 NOTE — Progress Notes (Unsigned)
Care Management & Coordination Services Pharmacy Note  09/20/2022 Name:  RAHEL CARLTON MRN:  [id number] DOB:  [birth year]  Summary: ***  Recommendations/Changes made from today's visit: ***  Follow up plan: ***   Subjective: NATHALIA WISMER is an 72 y.o. year old female who is a primary patient of Birdie Riddle, Aundra Millet, MD.  The care coordination team was consulted for assistance with disease management and care coordination needs.    Engaged with patient by telephone for follow up visit. v   Objective:  Lab Results  Component Value Date   CREATININE 1.63 (H) 06/21/2022   BUN 22 06/21/2022   GFR 31.26 (L) 06/21/2022   GFRNONAA 28 (L) 06/19/2022   GFRAA 60 (L) 10/26/2018   NA 131 (L) 06/21/2022   K 3.1 (L) 06/21/2022   CALCIUM 9.8 06/21/2022   CO2 20 06/21/2022   GLUCOSE 96 06/21/2022    Lab Results  Component Value Date/Time   HGBA1C 5.6 05/14/2015 07:20 PM   GFR 31.26 (L) 06/21/2022 09:08 AM   GFR 49.07 (L) 11/06/2020 11:05 AM    Last diabetic Eye exam: No results found for: "HMDIABEYEEXA"  Last diabetic Foot exam: No results found for: "HMDIABFOOTEX"   Lab Results  Component Value Date   CHOL 183 11/06/2020   HDL 48.20 11/06/2020   LDLCALC 111 (H) 04/07/2020   LDLDIRECT 98.0 11/06/2020   TRIG 231.0 (H) 11/06/2020   CHOLHDL 4 11/06/2020       Latest Ref Rng & Units 06/21/2022    9:08 AM 11/06/2020   11:05 AM 05/13/2020    1:31 PM  Hepatic Function  Total Protein 6.0 - 8.3 g/dL 8.6  7.9  6.9   Albumin 3.5 - 5.2 g/dL 4.0  4.4  4.4   AST 0 - 37 U/L '25  24  22   '$ ALT 0 - 35 U/L '12  19  15   '$ Alk Phosphatase 39 - 117 U/L 93  78  77   Total Bilirubin 0.2 - 1.2 mg/dL 0.4  0.6  0.6   Bilirubin, Direct 0.0 - 0.3 mg/dL 0.1  0.1  0.1     Lab Results  Component Value Date/Time   TSH 1.11 06/21/2022 09:08 AM   TSH 2.31 11/06/2020 11:05 AM   FREET4 1.20 11/05/2018 09:46 AM       Latest Ref Rng & Units 06/21/2022    9:08 AM 06/19/2022   12:08 PM  11/06/2020   11:05 AM  CBC  WBC 4.0 - 10.5 K/uL 14.9  19.1  7.7   Hemoglobin 12.0 - 15.0 g/dL 11.7  12.8  15.6   Hematocrit 36.0 - 46.0 % 36.9  40.4  46.2   Platelets 150.0 - 400.0 K/uL 527.0  620  321.0     Lab Results  Component Value Date/Time   VITAMINB12 229 06/21/2022 09:08 AM    Clinical ASCVD: {YES/NO:21197} The 10-year ASCVD risk score (Arnett DK, et al., 2019) is: 14.7%   Values used to calculate the score:     Age: 10 years     Sex: Female     Is Non-Hispanic African American: No     Diabetic: No     Tobacco smoker: No     Systolic Blood Pressure: 532 mmHg     Is BP treated: Yes     HDL Cholesterol: 48.2 mg/dL     Total Cholesterol: 183 mg/dL    ***Other: (CHADS2VASc if Afib, MMRC or CAT for COPD, ACT, DEXA)  09/07/2022    8:46 AM 08/08/2022    8:14 AM 07/12/2022    8:28 AM  Depression screen PHQ 2/9  Decreased Interest 0 0 0  Down, Depressed, Hopeless 0 0 0  PHQ - 2 Score 0 0 0  Altered sleeping 0 0 0  Tired, decreased energy 3 1 0  Change in appetite 0 0 0  Feeling bad or failure about yourself  0 0 0  Trouble concentrating 0 0 0  Moving slowly or fidgety/restless 0 0 0  Suicidal thoughts 0 0 0  PHQ-9 Score 3 1 0  Difficult doing work/chores Not difficult at all Not difficult at all      Social History   Tobacco Use  Smoking Status Never  Smokeless Tobacco Never   BP Readings from Last 3 Encounters:  09/07/22 118/74  08/08/22 112/68  08/01/22 (!) 142/70   Pulse Readings from Last 3 Encounters:  09/07/22 86  08/08/22 85  08/01/22 81   Wt Readings from Last 3 Encounters:  09/07/22 103 lb 6.4 oz (46.9 kg)  08/08/22 105 lb 2 oz (47.7 kg)  08/01/22 105 lb 3.2 oz (47.7 kg)   BMI Readings from Last 3 Encounters:  09/07/22 18.32 kg/m  08/08/22 18.62 kg/m  08/01/22 18.64 kg/m    Allergies  Allergen Reactions   Fenofibrate Other (See Comments)    GI side effects    Erythromycin Nausea And Vomiting    REACTION: nausea and  vomiting   Guaifenesin Er Palpitations   Sulfonamide Derivatives Nausea And Vomiting    REACTION: nausea and vomiting    Medications Reviewed Today     Reviewed by Midge Minium, MD (Physician) on 09/07/22 at [phone]  Med List Status: <None>   Medication Order Taking? Sig Documenting Provider Last Dose Status Informant  Acetaminophen (TYLENOL ARTHRITIS PAIN PO) [id number] Yes Take 325 mg by mouth daily as needed (FOR MILD PAIN). [provider] Taking Active Self  ALPRAZolam (XANAX) 0.25 MG tablet [id number] Yes Take 1 tablet (0.25 mg total) by mouth 2 (two) times daily as needed for anxiety. Midge Minium, MD Taking Active   amitriptyline (ELAVIL) 50 MG tablet [id number] Yes Take one tablet at bedtime Midge Minium, MD Taking Active Self  atorvastatin (LIPITOR) 20 MG tablet [id number] Yes TAKE 1 TABLET(20 MG) BY MOUTH DAILY Midge Minium, MD Taking Active Self  digoxin (LANOXIN) 0.125 MG tablet [id number] Yes Take 0.125 mg by mouth daily. [provider] Taking Active   fludrocortisone (FLORINEF) 0.1 MG tablet [id number] Yes Take one tablet daily Midge Minium, MD Taking Active Self  furosemide (LASIX) 20 MG tablet [id number] Yes Take 1 tablet (20 mg total) by mouth daily. Ledora Bottcher, PA Taking Active   hydrocortisone (CORTEF) 10 MG tablet [id number] Yes Take 10 mg by mouth 2 (two) times daily. Patient reports taking BID 09/07/2022 [provider] Taking Active Self  hydroxypropyl methylcellulose / hypromellose (ISOPTO TEARS / GONIOVISC) 2.5 % ophthalmic solution [id number] Yes Place 2 drops into both eyes daily as needed for dry eyes. [provider] Taking Active Self  levothyroxine (SYNTHROID) 25 MCG tablet [id number] Yes Take 25 mcg by mouth every morning. [provider] Taking Active   potassium chloride SA (KLOR-CON M) 20 MEQ tablet [id number] Yes Take 3 tablets (60 mEq total) by mouth daily. Ledora Bottcher, PA  Taking Active   sertraline (ZOLOFT) 25 MG tablet [id number] Yes Take 1 tablet (25 mg total) by mouth daily. Annye Asa  E, MD Taking Active             SDOH:  (Social Determinants of Health) assessments and interventions performed: {yes/no:20286} SDOH Interventions    Flowsheet Row Clinical Support from 11/11/2021 in [facility] at [facility] Management from 02/04/2020 in [facility] at [place] Interventions Intervention Not Indicated --  Housing Interventions Intervention Not Indicated --  Transportation Interventions Intervention Not Indicated --  Financial Strain Interventions Intervention Not Indicated --  Physical Activity Interventions Intervention Not Indicated Other (Comments)  Social Connections Interventions Intervention Not Indicated --       Medication Assistance: {MEDASSISTANCEINFO:25044}  Medication Access: Within the past 30 days, how often has patient missed a dose of medication? *** Is a pillbox or other method used to improve adherence? {YES/NO:21197} Factors that may affect medication adherence? {CHL DESC; BARRIERS:21522} Are meds synced by current pharmacy? {YES/NO:21197} Are meds delivered by current pharmacy? {YES/NO:21197} Does patient experience delays in picking up medications due to transportation concerns? {YES/NO:21197}  [facility] Reviewed: Is patient disadvantaged to use [facility]?: {YES/NO:21197} Current Rx insurance plan: *** Name and location of Current pharmacy:  [facility], [place] - [address] [place] [address] [place] [address] Phone: [phone] Fax: [phone]  [facility] services reviewed with patient today?: {YES/NO:21197} Patient requests to transfer care to [facility]?: {YES/NO:21197} Reason patient declined to  change pharmacies: {US patient preference:27474}  Compliance/Adherence/Medication fill history: Care Gaps: ***  Star-Rating Drugs: ***   Assessment/Plan      Current Barriers:  Pain and BP dropping  Pharmacist Clinical Goal(s):  Patient will verbalize ability to afford treatment regimen contact provider office for questions/concerns as evidenced notation of same in electronic health record through collaboration with PharmD and provider.   Interventions: 1:1 collaboration with Midge Minium, MD regarding development and update of comprehensive plan of care as evidenced by provider attestation and co-signature Inter-disciplinary care team collaboration (see longitudinal plan of care) Comprehensive medication review performed; medication list updated in electronic medical record  Blood pressure  (Status:New goal.)   Med Management Intervention: Continued monitoring   (BP goal <130/80) 03/23/22 -Controlled based on home readings Continues to monitor BP and pulse at home - 106/75, 113/72 Current treatment: Furosemide 20 mg once daily Appropriate, Effective, Safe, Accessible Metoprolol succinate 25 mg once daily Appropriate, Effective, Safe, Accessible -Current exercise habits: Stays active at work, around the house, and with grandchildren.  -She is still getting dizzy after she stands up sometimes.  Denies any recent falls. -Educated on BP goals and benefits of medications for prevention of heart attack, stroke and kidney damage; Importance of home blood pressure monitoring; -Counseled to go slow, after standing up.  Concern for falls, BP not as low as in the past.  Continue to monitor, no need for changes at this time.  She did resolve issue with Toprol.  Update 09/15/21 71/48 - dizzy when stands up.  One instance of this and then BP normalized. P - 60s-70s at home. She continues long term steroids. Most of her BP's have been on the lower normal side. Has FU with Dr.  Chalmers Cater upcoming and plans to discuss. Went without Toprol XL for a month due to pharmacy error. Encouraged her to reach out to [place] if this issues happens again.  GERD (Goal: minimize symptoms) 03/23/22 -Controlled  -  Current treatment  Famotidine 40 mg daily Appropriate, Effective, Safe, Accessible -Reviewed triggers including those possibly caused by medications -Recommended to continue current medication -She recently increased to famotidine '40mg'$  and reports her GERD has gotten better.  She takes medication daily.  No changes needed at this time, continue current medication. Continue to work on trigger foods.  Update 09/15/21 Continues to have symptoms based on diet. She reports OTC tums help her more than Pepcid does. Cautioned with overuse due to calcium. Work on [employer]. No changes at this time.  Bone health She reports a recent bone density test in June or July with Dr. Limmie Patricia obtain records via EPIC  Patient Goals/Self-Care Activities Patient will:  - check blood pressure 1-2x/week, document, and provide at future appointments  Medication Assistance: None required.  Patient affirms current coverage meets needs.         ***

## 2022-09-22 DIAGNOSIS — E039 Hypothyroidism, unspecified: Secondary | ICD-10-CM | POA: Diagnosis not present

## 2022-09-22 DIAGNOSIS — E2740 Unspecified adrenocortical insufficiency: Secondary | ICD-10-CM | POA: Diagnosis not present

## 2022-09-27 ENCOUNTER — Telehealth: Payer: Self-pay | Admitting: Pharmacist

## 2022-09-27 NOTE — Progress Notes (Signed)
Care Management & Coordination Services Pharmacy Team   Reason for Encounter: Appointment Reminder  Contacted patient to confirm telephone appointment with Leata Mouse, PharmD on 09/28/22 at 2:00pm. Unsuccessful outreach. Left voicemail for patient to return call. Left detailed instructions and appointment information for patient.   [facility], [facility]

## 2022-09-28 ENCOUNTER — Encounter: Payer: Medicare Other | Admitting: Pharmacist

## 2022-09-29 DIAGNOSIS — M899 Disorder of bone, unspecified: Secondary | ICD-10-CM | POA: Diagnosis not present

## 2022-09-29 DIAGNOSIS — E559 Vitamin D deficiency, unspecified: Secondary | ICD-10-CM | POA: Diagnosis not present

## 2022-09-29 DIAGNOSIS — K219 Gastro-esophageal reflux disease without esophagitis: Secondary | ICD-10-CM | POA: Diagnosis not present

## 2022-09-29 DIAGNOSIS — E876 Hypokalemia: Secondary | ICD-10-CM | POA: Diagnosis not present

## 2022-09-29 DIAGNOSIS — E785 Hyperlipidemia, unspecified: Secondary | ICD-10-CM | POA: Diagnosis not present

## 2022-09-29 DIAGNOSIS — M810 Age-related osteoporosis without current pathological fracture: Secondary | ICD-10-CM | POA: Diagnosis not present

## 2022-09-29 DIAGNOSIS — E2740 Unspecified adrenocortical insufficiency: Secondary | ICD-10-CM | POA: Diagnosis not present

## 2022-09-29 DIAGNOSIS — E039 Hypothyroidism, unspecified: Secondary | ICD-10-CM | POA: Diagnosis not present

## 2022-09-30 DIAGNOSIS — Z23 Encounter for immunization: Secondary | ICD-10-CM | POA: Diagnosis not present

## 2022-10-10 ENCOUNTER — Other Ambulatory Visit: Payer: Self-pay

## 2022-10-10 MED ORDER — ATORVASTATIN CALCIUM 20 MG PO TABS: ORAL_TABLET | ORAL | 1 refills | Status: DC

## 2022-10-10 MED ORDER — FLUDROCORTISONE ACETATE 0.1 MG PO TABS: ORAL_TABLET | ORAL | 1 refills | Status: DC

## 2022-10-12 ENCOUNTER — Encounter: Payer: Self-pay | Admitting: Family Medicine

## 2022-10-12 ENCOUNTER — Ambulatory Visit (INDEPENDENT_AMBULATORY_CARE_PROVIDER_SITE_OTHER): Payer: Medicare Other | Admitting: Family Medicine

## 2022-10-12 VITALS — BP 122/68 | HR 48 | Temp 97.7°F | Resp 16 | Ht 63.0 in | Wt 105.5 lb

## 2022-10-12 DIAGNOSIS — J329 Chronic sinusitis, unspecified: Secondary | ICD-10-CM

## 2022-10-12 DIAGNOSIS — B9689 Other specified bacterial agents as the cause of diseases classified elsewhere: Secondary | ICD-10-CM

## 2022-10-12 MED ORDER — AMOXICILLIN 875 MG PO TABS: 875.0000 mg | ORAL_TABLET | Freq: Two times a day (BID) | ORAL | 0 refills | Status: AC

## 2022-10-12 MED ORDER — NAPROXEN 500 MG PO TABS: 500.0000 mg | ORAL_TABLET | Freq: Two times a day (BID) | ORAL | 0 refills | Status: DC

## 2022-10-12 NOTE — Progress Notes (Signed)
   Subjective:    Patient ID: Kaitlyn Adams, female    DOB: Dec 23, 1950, 72 y.o.   MRN: [id number]  HPI 'i feel like it's my normal sinus stuff'- sxs started ~4 days ago.  + HA, congestion, frontal sinus pain.  No fevers.  Denies cough above baseline.  R ear pressure.  No sore throat.  Pt reports feeling inflamed- like costochondritis or fibro flare.  Pt has Addison's and is on chronic steroids.   Review of Systems For ROS see HPI     Objective:   Physical Exam Vitals reviewed.  Constitutional:      General: She is not in acute distress.    Appearance: Normal appearance. She is well-developed. She is ill-appearing.  HENT:     Head: Normocephalic and atraumatic.     Right Ear: Tympanic membrane normal.     Left Ear: Tympanic membrane normal.     Nose: Mucosal edema and congestion present. No rhinorrhea.     Right Sinus: Maxillary sinus tenderness and frontal sinus tenderness present.     Left Sinus: Maxillary sinus tenderness and frontal sinus tenderness present.     Mouth/Throat:     Pharynx: Uvula midline. Posterior oropharyngeal erythema present. No oropharyngeal exudate.  Eyes:     Conjunctiva/sclera: Conjunctivae normal.     Pupils: Pupils are equal, round, and reactive to light.  Cardiovascular:     Rate and Rhythm: Normal rate and regular rhythm.     Heart sounds: Normal heart sounds.  Pulmonary:     Effort: Pulmonary effort is normal. No respiratory distress.     Breath sounds: Normal breath sounds. No wheezing.  Musculoskeletal:     Cervical back: Normal range of motion and neck supple.  Lymphadenopathy:     Cervical: No cervical adenopathy.  Skin:    General: Skin is warm and dry.  Neurological:     General: No focal deficit present.     Mental Status: She is alert and oriented to person, place, and time.     Cranial Nerves: No cranial nerve deficit.     Motor: No weakness.     Coordination: Coordination normal.  Psychiatric:        Mood and Affect: Mood  normal.        Behavior: Behavior normal.        Thought Content: Thought content normal.           Assessment & Plan:  Bacterial sinusitis- pt has hx of similar and reports this is consistent w/ previous infxns.  Start Amoxicillin BID.  Naproxen as needed for pain/inflammation.  Reviewed supportive care and red flags that should prompt return.  Pt expressed understanding and is in agreement w/ plan.

## 2022-10-12 NOTE — Patient Instructions (Signed)
Follow up as needed or as scheduled START the Amoxicillin twice daily w/ food to treat your sinus infection USE the Naproxen twice daily- w/ food- for pain and inflammation Drink LOTS of fluids REST! Call with any questions or concerns Hang in there!

## 2022-10-18 ENCOUNTER — Ambulatory Visit (HOSPITAL_BASED_OUTPATIENT_CLINIC_OR_DEPARTMENT_OTHER)
Admission: RE | Admit: 2022-10-18 | Discharge: 2022-10-18 | Disposition: A | Discharge status: Home / Self Care | Payer: Medicare Other | Source: Ambulatory Visit | Attending: Family Medicine | Admitting: Family Medicine

## 2022-10-18 ENCOUNTER — Encounter: Payer: Self-pay | Admitting: Family Medicine

## 2022-10-18 ENCOUNTER — Ambulatory Visit (INDEPENDENT_AMBULATORY_CARE_PROVIDER_SITE_OTHER): Payer: Medicare Other | Admitting: Family Medicine

## 2022-10-18 VITALS — BP 104/54 | HR 83 | Temp 98.5°F | Resp 16 | Ht 63.0 in | Wt 105.4 lb

## 2022-10-18 DIAGNOSIS — R6889 Other general symptoms and signs: Secondary | ICD-10-CM | POA: Diagnosis not present

## 2022-10-18 DIAGNOSIS — J441 Chronic obstructive pulmonary disease with (acute) exacerbation: Secondary | ICD-10-CM | POA: Diagnosis not present

## 2022-10-18 DIAGNOSIS — J210 Acute bronchiolitis due to respiratory syncytial virus: Secondary | ICD-10-CM | POA: Diagnosis not present

## 2022-10-18 DIAGNOSIS — Z1152 Encounter for screening for COVID-19: Secondary | ICD-10-CM

## 2022-10-18 DIAGNOSIS — R059 Cough, unspecified: Secondary | ICD-10-CM | POA: Diagnosis not present

## 2022-10-18 LAB — POCT INFLUENZA A/B
Influenza A, POC: NEGATIVE
Influenza B, POC: NEGATIVE

## 2022-10-18 LAB — POCT RESPIRATORY SYNCYTIAL VIRUS: RSV Rapid Ag: NEGATIVE

## 2022-10-18 LAB — POC COVID19 BINAXNOW: SARS Coronavirus 2 Ag: NEGATIVE

## 2022-10-18 MED ORDER — PULMICORT FLEXHALER 90 MCG/ACT IN AEPB: 2.0000 | INHALATION_SPRAY | Freq: Two times a day (BID) | RESPIRATORY_TRACT | 1 refills | Status: DC

## 2022-10-18 MED ORDER — ALBUTEROL SULFATE HFA 108 (90 BASE) MCG/ACT IN AERS: 2.0000 | INHALATION_SPRAY | Freq: Four times a day (QID) | RESPIRATORY_TRACT | 0 refills | Status: DC | PRN

## 2022-10-18 NOTE — Progress Notes (Unsigned)
   Subjective:    Patient ID: Kaitlyn Adams, female    DOB: 08-Nov-1950, 72 y.o.   MRN: [id number]  HPI Cough- pt is currently on Amoxicillin 850m BID for sinusitis.  She reports that 4 days ago developed a 'bad cough'.  Cough is intermittently productive of yellow sputum.  No relief w/ OTC cough medicines.  + SOB.  Feeling 'weak and unsteady on my feet'.  + decreased appetite.  Pt has hx of COPD/chronic bronchitis.  Not currently on any inhalers.  Husband reports some wheezing.     Review of Systems For ROS see HPI     Objective:   Physical Exam Vitals reviewed.  Constitutional:      General: She is not in acute distress.    Appearance: She is not ill-appearing.     Comments: Frail, elderly woman  HENT:     Head: Normocephalic and atraumatic.  Cardiovascular:     Rate and Rhythm: Normal rate.     Pulses: Normal pulses.  Pulmonary:     Effort: Pulmonary effort is normal. No respiratory distress.     Breath sounds: No wheezing or rhonchi.     Comments: Dry cough Skin:    General: Skin is warm and dry.  Neurological:     General: No focal deficit present.     Mental Status: She is alert and oriented to person, place, and time.  Psychiatric:        Mood and Affect: Mood normal.        Behavior: Behavior normal.        Thought Content: Thought content normal.           Assessment & Plan:  COPD exacerbation- pt currently on abx for sinusitis but has developed cough that is described as 'severe' and intermittently productive.  Husband reports some wheezing.  Pt feels weak and unsteady.  Will get CXR to assess for possible PNA, but in the meantime, will start Albuterol inhaler prn and Pulmicort BID until sxs improve.  Reviewed supportive care and red flags that should prompt return.  Pt expressed understanding and is in agreement w/ plan.

## 2022-10-18 NOTE — Patient Instructions (Addendum)
Go to the [facility] [address] to get your chest xray START the Pulmicort (Budesonide) inhaler twice daily until you are feeling better USE the Albuterol inhaler as needed for cough, wheezing, shortness of breath FINISH the Amoxicillin Continue Robitussin or Delsym over the counter as needed for cough Make sure you are eating and drinking so you have the energy to fight back! Call with any questions or concerns Hang in there!!

## 2022-10-20 ENCOUNTER — Telehealth: Payer: Self-pay

## 2022-10-20 NOTE — Telephone Encounter (Signed)
Informed pt of chest xray results

## 2022-10-20 NOTE — Telephone Encounter (Signed)
-----   Message from Midge Minium, MD sent at 10/20/2022 11:09 AM EST ----- Thankfully chest xray is normal

## 2022-10-21 ENCOUNTER — Telehealth: Payer: Self-pay | Admitting: Family Medicine

## 2022-10-21 MED ORDER — QVAR REDIHALER 80 MCG/ACT IN AERB: 2.0000 | INHALATION_SPRAY | Freq: Two times a day (BID) | RESPIRATORY_TRACT | 1 refills | Status: DC

## 2022-10-21 NOTE — Telephone Encounter (Signed)
Spoke to pt and informed her that we sent in Qvar to the pharmacy

## 2022-10-21 NOTE — Telephone Encounter (Signed)
Pt is having a hard time getting the Pulmicort inhaler . Can we switch the inhaler for pt ?

## 2022-10-21 NOTE — Telephone Encounter (Signed)
Caller name: LAURIANN THATCHER  On DPR?: Yes  Call back number: [phone] (mobile)  Provider they see: Midge Minium, MD  Reason for call: Patient had a ov with Dr.Tabori on Tuesday. Patient was prescribed Budesonide (PULMICORT FLEXHALER) 90 MCG/ACT inhaler. Patients states that her pharmacy is out of stock. Pharmacy has called other pharmacies as well and their out of stock . Patient wants to know if she can try another inhaler.

## 2022-10-21 NOTE — Telephone Encounter (Signed)
Since pulmicort was difficult to get, I sent in Qvar instead

## 2022-11-08 ENCOUNTER — Other Ambulatory Visit: Payer: Self-pay

## 2022-11-08 ENCOUNTER — Other Ambulatory Visit: Payer: Self-pay | Admitting: Family Medicine

## 2022-11-08 DIAGNOSIS — K5909 Other constipation: Secondary | ICD-10-CM

## 2022-11-08 MED ORDER — AMITRIPTYLINE HCL 50 MG PO TABS: ORAL_TABLET | ORAL | 1 refills | Status: DC

## 2022-11-15 ENCOUNTER — Ambulatory Visit (INDEPENDENT_AMBULATORY_CARE_PROVIDER_SITE_OTHER): Payer: Medicare Other | Admitting: Physician Assistant

## 2022-11-15 ENCOUNTER — Ambulatory Visit: Payer: Medicare Other | Admitting: Family

## 2022-11-15 VITALS — BP 100/62 | HR 76 | Temp 97.3°F | Ht 63.0 in | Wt 102.0 lb

## 2022-11-15 DIAGNOSIS — R531 Weakness: Secondary | ICD-10-CM

## 2022-11-15 DIAGNOSIS — R296 Repeated falls: Secondary | ICD-10-CM | POA: Diagnosis not present

## 2022-11-15 DIAGNOSIS — R052 Subacute cough: Secondary | ICD-10-CM

## 2022-11-15 NOTE — Patient Instructions (Signed)
It was great to see you!  Please go to [facility] [address], [place], [place] [address]  Take care,  Inda Coke PA-C

## 2022-11-15 NOTE — Progress Notes (Signed)
Kaitlyn Adams is a 72 y.o. female here for a new problem.  History of Present Illness:   Chief Complaint  Patient presents with   Cough    Pt c/o dry cough x 3 weeks or more, having pain in her left rib area. She is taking Aleve and Tylenol.  Pt has fallen twice the past 2 days, legs just give out.    HPI  Cough: She complains of a dry cough and difficulty breathing for about 3 weeks with associated rib pain. She is taking Aleve and Tylenol to relieve symptoms.  Compliant with [place] inhaler twice daily and using her albuterol inhaler about 1-2 times a day.  She has followed up with Dr. Birdie Riddle twice since symptoms began. A recent chest xray from 2/27 was normal.  She finished her second round of amoxicillin in 10/22/2022, first round was 08/2022.  She was previously prescribed Naproxen for rib and chest pain. She took this and ran out, did not feel like it was particularly helpful. She reports not having followed up with pulmonology in many years.    Fall/Weakness:  She reports 2 falls in the last 2 days, stating her legs would buckle and give out.  Her husband states she has been needing help with tasks such as using the bathroom.  Has history of malnutrition, but has been eating and drinking more lately.  She denies hitting her head during her recent falls.  Her husband notes she was having falling spells in 05/2023, causing her to present to the ED on 06/19/2022. She has been taking potassium supplements and states her potassium levels were within normal limits in 09/22/2022. She is compliant with her care with endocrinology with Dr Chalmers Cater. Denies any leg numbness, tingling, or pain. Has a history of Addison's disease and is compliant with Hydrocortisone.    Past Medical History:  Diagnosis Date   Addison disease ([place])    Anemia    Asthma    - normal spirometry 2009 FEV1  >90% predicted.  - methacoline challenge neg 11/09   Bronchitis    recurrent   Cataract     Chronic cough    - sinus CT 03-23-10 > neg.  - allergy profile 03-23-10 > nl, IgE 14.  - flutter valve rx 03-23-10   Fibromyalgia    GERD (gastroesophageal reflux disease)    exacerbating VCD   Glaucoma    History of blood transfusion    Hyperlipidemia    Hypertension    Hyperthyroidism    with hot nodule.  - total thyroidectomy 11-05-08 benign -> subsequent hypothyroidism.   Hypokalemia    Hypothyroidism    Hypothyroidism    a. following thyroidectomy.   Mild CAD    a. LHC 04/2015 - mild nonobstructive CAD with 30% dLAD, 40% D1, 25% pLCx, 30% mRCA, 30% RPDA, normal EF >65% with normal LVEDP.   Orthostatic hypotension    Paroxysmal atrial fibrillation (HCC)    Renal insufficiency    Vasomotor rhinitis    exacerbates VCD   Vocal cord dysfunction    proven on FOB 9/09     Social History   Tobacco Use   Smoking status: Never   Smokeless tobacco: Never  Vaping Use   Vaping Use: Never used  Substance Use Topics   Alcohol use: Yes    Alcohol/week: 1.0 standard drink of alcohol    Types: 1 Glasses of wine per week    Comment: occasional wine or beer   Drug use: No  Past Surgical History:  Procedure Laterality Date   ABDOMINAL HYSTERECTOMY  1980   ABDOMINAL HYSTERECTOMY     BASAL CELL CARCINOMA EXCISION  11/08   CARDIAC CATHETERIZATION N/A 05/15/2015   Procedure: Left Heart Cath and Coronary Angiography;  Surgeon: Sherren Mocha, MD;  Location: [facility];  Service: Cardiovascular;  Laterality: N/A;   COLONOSCOPY N/A 04/27/2017   Dr Silverio Decamp for scant rectal bleeding and iron def anemia: Melanosis coli, lipomatous IC valve, left sided tics, non-bleeding hemorrhoid, 10 mm transverse polyp.     ESOPHAGOGASTRODUODENOSCOPY N/A 04/27/2017   Mauri Pole, MD; [facility] ENDOSCOPY. for iron def anemia.  Monilial/candidial esophagitis. Small HH.     EYE SURGERY     scar tissue removal  1982   THYROID SURGERY  2010   THYROIDECTOMY  11-05-08    Family History  Problem Relation Age  of Onset   Asthma Mother    Heart disease Mother    Emphysema Father    Heart disease Father    Irregular heart beat Brother    Asthma Grandchild     Allergies  Allergen Reactions   Fenofibrate Other (See Comments)    GI side effects    Erythromycin Nausea And Vomiting    REACTION: nausea and vomiting   Guaifenesin Er Palpitations   Sulfonamide Derivatives Nausea And Vomiting    REACTION: nausea and vomiting    Current Medications:   Current Outpatient Medications:    Acetaminophen (TYLENOL ARTHRITIS PAIN PO), Take 325 mg by mouth daily as needed (FOR MILD PAIN)., Disp: , Rfl:    albuterol (VENTOLIN HFA) 108 (90 Base) MCG/ACT inhaler, INHALE 2 PUFFS INTO THE LUNGS EVERY 6 HOURS AS NEEDED FOR WHEEZING OR SHORTNESS OF BREATH, Disp: 6.7 g, Rfl: 1   ALPRAZolam (XANAX) 0.25 MG tablet, Take 1 tablet (0.25 mg total) by mouth 2 (two) times daily as needed for anxiety., Disp: 30 tablet, Rfl: 0   amitriptyline (ELAVIL) 50 MG tablet, Take one tablet at bedtime, Disp: 90 tablet, Rfl: 1   atorvastatin (LIPITOR) 20 MG tablet, TAKE 1 TABLET(20 MG) BY MOUTH DAILY, Disp: 90 tablet, Rfl: 1   Budesonide (PULMICORT FLEXHALER) 90 MCG/ACT inhaler, Inhale 2 puffs into the lungs 2 (two) times daily., Disp: 1 each, Rfl: 1   digoxin (LANOXIN) 0.125 MG tablet, Take 1 tablet (0.125 mg total) by mouth daily., Disp: 90 tablet, Rfl: 3   fludrocortisone (FLORINEF) 0.1 MG tablet, Take one tablet daily, Disp: 90 tablet, Rfl: 1   furosemide (LASIX) 20 MG tablet, Take 1 tablet (20 mg total) by mouth daily., Disp: 90 tablet, Rfl: 3   hydrocortisone (CORTEF) 10 MG tablet, Take 10 mg by mouth 2 (two) times daily. Patient reports taking BID 09/07/2022, Disp: , Rfl:    hydroxypropyl methylcellulose / hypromellose (ISOPTO TEARS / GONIOVISC) 2.5 % ophthalmic solution, Place 2 drops into both eyes daily as needed for dry eyes., Disp: , Rfl:    levothyroxine (SYNTHROID) 25 MCG tablet, Take 25 mcg by mouth every morning.,  Disp: , Rfl:    potassium chloride SA (KLOR-CON M) 20 MEQ tablet, Take 3 tablets (60 mEq total) by mouth daily., Disp: 270 tablet, Rfl: 3   sertraline (ZOLOFT) 25 MG tablet, Take 1 tablet (25 mg total) by mouth daily., Disp: 30 tablet, Rfl: 3   Review of Systems:   Review of Systems  Respiratory:  Positive for cough (dry).   Musculoskeletal:  Positive for falls (2 in last 2 days).    Vitals:  Vitals:   11/15/22 1030  BP: 100/62  Pulse: 76  Temp: (!) 97.3 F (36.3 C)  TempSrc: Temporal  Weight: 102 lb (46.3 kg)  Height: 5\' 3"  (1.6 m)     Body mass index is 18.07 kg/m.  Physical Exam:   Physical Exam Vitals and nursing note reviewed.  Constitutional:      General: She is not in acute distress.    Appearance: She is well-developed. She is not ill-appearing or toxic-appearing.  Cardiovascular:     Rate and Rhythm: Normal rate and regular rhythm.     Pulses: Normal pulses.     Heart sounds: Normal heart sounds, S1 normal and S2 normal.  Pulmonary:     Effort: Pulmonary effort is normal.     Breath sounds: Normal breath sounds.  Skin:    General: Skin is warm and dry.  Neurological:     Mental Status: She is alert.     GCS: GCS eye subscore is 4. GCS verbal subscore is 5. GCS motor subscore is 6.  Psychiatric:        Speech: Speech normal.        Behavior: Behavior normal. Behavior is cooperative.     Assessment and Plan:   Weakness; Recurrent falls Sudden onset Patient is not well known to me, but has significant past medical hx of Addison's disease, malnutrition, a fib, symptomatic anemia Feel as though she would best be served in ER -- discussed with patient and husband  Subacute cough Has had negative work-up thus far Recommend follow-up with PCP to discuss and consider pulm referral Consider CT scan but we have referred to ER, will defer management to them   I,Rachel Rivera,acting as a scribe for Inda Coke, PA.,have documented all relevant  documentation on the behalf of Inda Coke, PA,as directed by  Inda Coke, PA while in the presence of Inda Coke, [place].   I, Inda Coke, [place], have reviewed all documentation for this visit. The documentation on 11/15/22 for the exam, diagnosis, procedures, and orders are all accurate and complete.   Inda Coke, PA-C

## 2022-11-17 ENCOUNTER — Ambulatory Visit (INDEPENDENT_AMBULATORY_CARE_PROVIDER_SITE_OTHER): Payer: Medicare Other | Admitting: Family Medicine

## 2022-11-17 DIAGNOSIS — Z0000 Encounter for general adult medical examination without abnormal findings: Secondary | ICD-10-CM

## 2022-11-17 NOTE — Progress Notes (Signed)
Subjective:   Kaitlyn Adams is a 72 y.o. female who presents for Medicare Annual (Subsequent) preventive examination.  I connected with  Wenda Overland on 11/17/22 by a telephone enabled telemedicine application and verified that I am speaking with the correct person using two identifiers.   I discussed the limitations of evaluation and management by telemedicine. The patient expressed understanding and agreed to proceed.  Patient location: home  Provider location: telephone/home    Review of Systems     Cardiac Risk Factors include: advanced age (>73men, >37 women);family history of premature cardiovascular disease;sedentary lifestyle     Objective:    Today's Vitals   11/17/22 0834  PainSc: 3    There is no height or weight on file to calculate BMI.     11/17/2022    8:39 AM 06/19/2022   10:59 AM 11/11/2021    8:22 AM 10/12/2020    8:22 AM 10/02/2019   10:59 AM 10/25/2018    2:20 PM 10/25/2018   12:05 PM  Advanced Directives  Does Patient Have a Medical Advance Directive? Yes No Yes Yes Yes No No  Type of Paramedic of [place];Living will [place];Living will [place];Living will    Does patient want to make changes to medical advance directive?     No - Patient declined    Copy of [place] in Chart?   No - copy requested Yes - validated most recent copy scanned in chart (See row information) Yes - validated most recent copy scanned in chart (See row information)    Would patient like information on creating a medical advance directive?  No - Patient declined    No - Patient declined No - Patient declined    Current Medications (verified) Outpatient Encounter Medications as of 11/17/2022  Medication Sig   Acetaminophen (TYLENOL ARTHRITIS PAIN PO) Take 325 mg by mouth daily as needed (FOR MILD PAIN).   albuterol (VENTOLIN HFA) 108 (90 Base) MCG/ACT  inhaler INHALE 2 PUFFS INTO THE LUNGS EVERY 6 HOURS AS NEEDED FOR WHEEZING OR SHORTNESS OF BREATH   ALPRAZolam (XANAX) 0.25 MG tablet Take 1 tablet (0.25 mg total) by mouth 2 (two) times daily as needed for anxiety.   amitriptyline (ELAVIL) 50 MG tablet Take one tablet at bedtime   atorvastatin (LIPITOR) 20 MG tablet TAKE 1 TABLET(20 MG) BY MOUTH DAILY   Budesonide (PULMICORT FLEXHALER) 90 MCG/ACT inhaler Inhale 2 puffs into the lungs 2 (two) times daily.   digoxin (LANOXIN) 0.125 MG tablet Take 1 tablet (0.125 mg total) by mouth daily.   fludrocortisone (FLORINEF) 0.1 MG tablet Take one tablet daily   furosemide (LASIX) 20 MG tablet Take 1 tablet (20 mg total) by mouth daily.   hydrocortisone (CORTEF) 10 MG tablet Take 10 mg by mouth 2 (two) times daily. Patient reports taking BID 09/07/2022   hydroxypropyl methylcellulose / hypromellose (ISOPTO TEARS / GONIOVISC) 2.5 % ophthalmic solution Place 2 drops into both eyes daily as needed for dry eyes.   levothyroxine (SYNTHROID) 25 MCG tablet Take 25 mcg by mouth every morning.   potassium chloride SA (KLOR-CON M) 20 MEQ tablet Take 3 tablets (60 mEq total) by mouth daily.   sertraline (ZOLOFT) 25 MG tablet Take 1 tablet (25 mg total) by mouth daily.   No facility-administered encounter medications on file as of 11/17/2022.    Allergies (verified) Fenofibrate, Erythromycin, Guaifenesin er, and Sulfonamide derivatives  History: Past Medical History:  Diagnosis Date   Addison disease ([place])    Anemia    Asthma    - normal spirometry 2009 FEV1  >90% predicted.  - methacoline challenge neg 11/09   Bronchitis    recurrent   Cataract    Chronic cough    - sinus CT 03-23-10 > neg.  - allergy profile 03-23-10 > nl, IgE 14.  - flutter valve rx 03-23-10   Fibromyalgia    GERD (gastroesophageal reflux disease)    exacerbating VCD   Glaucoma    History of blood transfusion    Hyperlipidemia    Hypertension    Hyperthyroidism    with hot nodule.  -  total thyroidectomy 11-05-08 benign -> subsequent hypothyroidism.   Hypokalemia    Hypothyroidism    Hypothyroidism    a. following thyroidectomy.   Mild CAD    a. LHC 04/2015 - mild nonobstructive CAD with 30% dLAD, 40% D1, 25% pLCx, 30% mRCA, 30% RPDA, normal EF >65% with normal LVEDP.   Orthostatic hypotension    Paroxysmal atrial fibrillation (HCC)    Renal insufficiency    Vasomotor rhinitis    exacerbates VCD   Vocal cord dysfunction    proven on FOB 9/09   Past Surgical History:  Procedure Laterality Date   ABDOMINAL HYSTERECTOMY  1980   ABDOMINAL HYSTERECTOMY     BASAL CELL CARCINOMA EXCISION  11/08   CARDIAC CATHETERIZATION N/A 05/15/2015   Procedure: Left Heart Cath and Coronary Angiography;  Surgeon: Sherren Mocha, MD;  Location: [facility];  Service: Cardiovascular;  Laterality: N/A;   COLONOSCOPY N/A 04/27/2017   Dr Silverio Decamp for scant rectal bleeding and iron def anemia: Melanosis coli, lipomatous IC valve, left sided tics, non-bleeding hemorrhoid, 10 mm transverse polyp.     ESOPHAGOGASTRODUODENOSCOPY N/A 04/27/2017   Mauri Pole, MD; [facility] ENDOSCOPY. for iron def anemia.  Monilial/candidial esophagitis. Small HH.     EYE SURGERY     scar tissue removal  1982   THYROID SURGERY  2010   THYROIDECTOMY  11-05-08   Family History  Problem Relation Age of Onset   Asthma Mother    Heart disease Mother    Emphysema Father    Heart disease Father    Irregular heart beat Brother    Asthma Grandchild    Social History   Socioeconomic History   Marital status: Married    Spouse name: Not on file   Number of children: 1   Years of education: Not on file   Highest education level: 12th grade  Occupational History   Occupation: owns a Scientist, research (physical sciences)   Occupation: OWNER    Employer: [employer] Use   Smoking status: Never   Smokeless tobacco: Never  Vaping Use   Vaping Use: Never used  Substance and Sexual Activity   Alcohol use:  Yes    Alcohol/week: 1.0 standard drink of alcohol    Types: 1 Glasses of wine per week    Comment: occasional wine or beer   Drug use: No   Sexual activity: Yes    Birth control/protection: None  Other Topics Concern   Not on file  Social History Narrative   Not on file   Social Determinants of Health   Financial Resource Strain: Low Risk  (11/17/2022)   Overall Financial Resource Strain (CARDIA)    Difficulty of Paying Living Expenses: Not hard at all  Food Insecurity: No Food Insecurity (11/17/2022)  Hunger Vital Sign    Worried About Running Out of Food in the Last Year: Never true    Ran Out of Food in the Last Year: Never true  Transportation Needs: No Transportation Needs (11/17/2022)   PRAPARE - Hydrologist (Medical): No    Lack of Transportation (Non-Medical): No  Physical Activity: Inactive (11/17/2022)   Exercise Vital Sign    Days of Exercise per Week: 0 days    Minutes of Exercise per Session: 0 min  Stress: No Stress Concern Present (11/17/2022)   [place]    Feeling of Stress : Not at all  Social Connections: Moderately Isolated (11/17/2022)   Social Connection and Isolation Panel [NHANES]    Frequency of Communication with Friends and Family: Three times a week    Frequency of Social Gatherings with Friends and Family: Three times a week    Attends Religious Services: Never    Active Member of Clubs or Organizations: No    Attends Archivist Meetings: Never    Marital Status: Married    Tobacco Counseling Counseling given: Not Answered   Clinical Intake:  Pre-visit preparation completed: Yes  Pain : 0-10 Pain Score: 3  Pain Type: Chronic pain Pain Location: Other (Comment) (all joints) Pain Descriptors / Indicators: Aching, Dull Pain Onset: More than a month ago Pain Frequency: Constant Pain Relieving Factors: aleve/tylenol  Pain Relieving  Factors: aleve/tylenol  Diabetes: No  How often do you need to have someone help you when you read instructions, pamphlets, or other written materials from your doctor or pharmacy?: 1 - Never  Diabetic?   no  Interpreter Needed?: No  Information entered by :: Leroy Kennedy LPN   Activities of Daily Living    11/17/2022    8:42 AM 11/16/2022    7:42 PM  In your present state of health, do you have any difficulty performing the following activities:  Hearing? 1 1  Vision? 0 0  Difficulty concentrating or making decisions? 0   Walking or climbing stairs? 1   Dressing or bathing? 0 0  Doing errands, shopping? 1 0  Preparing Food and eating ? N N  Using the Toilet? N N  In the past six months, have you accidently leaked urine? N N  Do you have problems with loss of bowel control? N N  Managing your Medications? N N  Managing your Finances? N N  Housekeeping or managing your Housekeeping? N N    Patient Care Team: Midge Minium, MD as PCP - General (Family Medicine) [place], Peter M, MD as PCP - Cardiology (Cardiology) Jacelyn Pi, MD as Consulting Physician (Endocrinology) Jola Schmidt, MD as Consulting Physician (Ophthalmology) [place], Peter M, MD as Consulting Physician (Cardiology) Tanda Rockers, MD as Consulting Physician (Pulmonary Disease) Edythe Clarity, [facility] (Pharmacist) [place], Tami Lin, [place] as Physician Assistant (Cardiology)  Indicate any recent Medical Services you may have received from other than [facility] providers in the past year (date may be approximate).     Assessment:   This is a routine wellness examination for [place].  Hearing/Vision screen Hearing Screening - Comments:: Has hearing aids bilateral Vision Screening - Comments:: Up to date [facility]  Dietary issues and exercise activities discussed: Current Exercise Habits: The patient does not participate in regular exercise at present (addison disease)   Goals Addressed              This Visit's  Progress    Patient Stated       Stay as active as can       Depression Screen    11/17/2022    8:45 AM 10/12/2022    7:50 AM 09/07/2022    8:46 AM 08/08/2022    8:14 AM 07/12/2022    8:28 AM 06/21/2022    8:42 AM 11/11/2021    8:26 AM  PHQ 2/9 Scores  PHQ - 2 Score 0 0 0 0 0 3 0  PHQ- 9 Score 0 1 3 1  0 9     Fall Risk    11/17/2022    8:37 AM 11/16/2022    7:42 PM 10/12/2022    7:50 AM 09/07/2022    8:46 AM 08/08/2022    8:14 AM  Fall Risk   Falls in the past year? 1 1 1 1 1   Number falls in past yr: 1 1 1 1 1   Injury with Fall? 0 0 0 1 1  Risk for fall due to : Impaired balance/gait  History of fall(s)  History of fall(s)  Follow up Falls evaluation completed;Education provided;Falls prevention discussed  Falls evaluation completed  Falls evaluation completed    FALL RISK PREVENTION PERTAINING TO THE HOME:  Any stairs in or around the home? Yes  If so, are there any without handrails? No  Home free of loose throw rugs in walkways, pet beds, electrical cords, etc? Yes  Adequate lighting in your home to reduce risk of falls? Yes   ASSISTIVE DEVICES UTILIZED TO PREVENT FALLS:  Life alert? No  Use of a cane, walker or w/c? Yes  Grab bars in the bathroom? No  Shower chair or bench in shower? Yes  Elevated toilet seat or a handicapped toilet? No   TIMED UP AND GO:  Was the test performed? No .    Cognitive Function:    07/11/2018    8:26 AM 07/06/2017    8:28 AM  MMSE - Mini Mental State Exam  Orientation to time 5 5  Orientation to Place 5 5  Registration 3 3  Attention/ Calculation 5 5  Recall 2 3  Language- name 2 objects 2 2  Language- repeat 1 1  Language- follow 3 step command 3 3  Language- read & follow direction 1 1  Write a sentence 1 1  Copy design 1 1  Total score 29 30        11/17/2022    8:40 AM 10/12/2020    8:32 AM  6CIT Screen  What Year? 0 points 0 points  What month? 0 points 0 points  What time? 0 points 0  points  Count back from 20 0 points 0 points  Months in reverse 0 points 2 points  Repeat phrase 2 points 2 points  Total Score 2 points 4 points    Immunizations Immunization History  Administered Date(s) Administered   Fluad Quad(high Dose 65+) 10/02/2019   Influenza Whole 06/22/2009   Influenza, High Dose Seasonal PF 07/06/2017, 08/01/2018   Influenza-Unspecified 07/07/2021   PFIZER(Purple Top)SARS-COV-2 Vaccination 10/18/2019, 11/13/2019, 05/28/2020   Pneumococcal Conjugate-13 11/18/2015   Pneumococcal Polysaccharide-23 08/23/2003, 07/06/2017   Tdap 07/10/2012    TDAP status: Due, Education has been provided regarding the importance of this vaccine. Advised may receive this vaccine at local pharmacy or Health Dept. Aware to provide a copy of the vaccination record if obtained from local pharmacy or Health Dept. Verbalized acceptance and understanding.  Flu Vaccine status: Due, Education  has been provided regarding the importance of this vaccine. Advised may receive this vaccine at local pharmacy or Health Dept. Aware to provide a copy of the vaccination record if obtained from local pharmacy or Health Dept. Verbalized acceptance and understanding.  Pneumococcal vaccine status: Up to date  Covid-19 vaccine status: Information provided on how to obtain vaccines.   Qualifies for Shingles Vaccine? Yes   Zostavax completed No   Shingrix Completed?: No.    Education has been provided regarding the importance of this vaccine. Patient has been advised to call insurance company to determine out of pocket expense if they have not yet received this vaccine. Advised may also receive vaccine at local pharmacy or Health Dept. Verbalized acceptance and understanding.  Screening Tests Health Maintenance  Topic Date Due   DEXA SCAN  03/16/2019   MAMMOGRAM  10/22/2022   INFLUENZA VACCINE  11/20/2022 (Originally 03/22/2022)   COLONOSCOPY (Pts 45-71yrs Insurance coverage will need to be  confirmed)  11/17/2023 (Originally 06/01/2018)   Medicare Annual Wellness ([place])  11/17/2023   Pneumonia Vaccine 82+ Years old  Completed   HPV VACCINES  Aged Out   DTaP/Tdap/Td  Discontinued   COVID-19 Vaccine  Discontinued   Hepatitis C Screening  Discontinued   Zoster Vaccines- Shingrix  Discontinued    Health Maintenance  Health Maintenance Due  Topic Date Due   DEXA SCAN  03/16/2019   MAMMOGRAM  10/22/2022    Colorectal cancer screening: No longer required.   Mammogram status: Completed 2023. Repeat every year  Bone Density is scheduled through Endocrinologist   Lung Cancer Screening: (Low Dose CT Chest recommended if Age 67-80 years, 30 pack-year currently smoking OR have quit w/in 15years.) does not qualify.   Lung Cancer Screening Referral:   Additional Screening:  Hepatitis C Screening: does not qualify; Completed 2011  Vision Screening: Recommended annual ophthalmology exams for early detection of glaucoma and other disorders of the eye. Is the patient up to date with their annual eye exam?  Yes  Who is the provider or what is the name of the office in which the patient attends annual eye exams? [facility] If pt is not established with a provider, would they like to be referred to a provider to establish care? No .   Dental Screening: Recommended annual dental exams for proper oral hygiene  Community Resource Referral / Chronic Care Management: CRR required this visit?  No   CCM required this visit?  No      Plan:     I have personally reviewed and noted the following in the patient's chart:  .status Nutritional status Physical activity Advanced directives List of other physicians Hospitalizations, surgeries, and ER visits in previous 12 months Vitals Screenings to include cognitive, depression, and falls Referrals and appointments  In addition, I have reviewed and discussed with patient certain preventive protocols, quality metrics, and best practice  recommendations. A written personalized care plan for preventive services as well as general preventive health recommendations were provided to patient.     Leroy Kennedy, LPN   [id number]   Nurse Notes:

## 2022-11-17 NOTE — Patient Instructions (Signed)
Kaitlyn Adams , Thank you for taking time to come for your Medicare Wellness Visit. I appreciate your ongoing commitment to your health goals. Please review the following plan we discussed and let me know if I can assist you in the future.   Screening recommendations/referrals: Colonoscopy: no longer required Recommended yearly ophthalmology/optometry visit for glaucoma screening and checkup Recommended yearly dental visit for hygiene and checkup  Vaccinations: Influenza vaccine: Education provided Pneumococcal vaccine: up to date Tdap vaccine: Education provided Shingles vaccine: Education provided    Advanced directives: not on file  Conditions/risks identified:    Preventive Care 73 Years and Older, Female Preventive care refers to lifestyle choices and visits with your health care provider that can promote health and wellness. What does preventive care include? A yearly physical exam. This is also called an annual well check. Dental exams once or twice a year. Routine eye exams. Ask your health care provider how often you should have your eyes checked. Personal lifestyle choices, including: Daily care of your teeth and gums. Regular physical activity. Eating a healthy diet. Avoiding tobacco and drug use. Limiting alcohol use. Practicing safe sex. Taking low doses of aspirin every day. Taking vitamin and mineral supplements as recommended by your health care provider. What happens during an annual well check? The services and screenings done by your health care provider during your annual well check will depend on your age, overall health, lifestyle risk factors, and family history of disease. Counseling  Your health care provider may ask you questions about your: Alcohol use. Tobacco use. Drug use. Emotional well-being. Home and relationship well-being. Sexual activity. Eating habits. History of falls. Memory and ability to understand (cognition). Work and work  Statistician. Screening  You may have the following tests or measurements: Height, weight, and BMI. Blood pressure. Lipid and cholesterol levels. These may be checked every 5 years, or more frequently if you are over 73 years old. Skin check. Lung cancer screening. You may have this screening every year starting at age 73 if you have a 30-pack-year history of smoking and currently smoke or have quit within the past 15 years. Fecal occult blood test (FOBT) of the stool. You may have this test every year starting at age 73. Flexible sigmoidoscopy or colonoscopy. You may have a sigmoidoscopy every 5 years or a colonoscopy every 10 years starting at age 73. Prostate cancer screening. Recommendations will vary depending on your family history and other risks. Hepatitis C blood test. Hepatitis B blood test. Sexually transmitted disease (STD) testing. Diabetes screening. This is done by checking your blood sugar (glucose) after you have not eaten for a while (fasting). You may have this done every 1-3 years. Abdominal aortic aneurysm (AAA) screening. You may need this if you are a current or former smoker. Osteoporosis. You may be screened starting at age 73 if you are at high risk. Talk with your health care provider about your test results, treatment options, and if necessary, the need for more tests. Vaccines  Your health care provider may recommend certain vaccines, such as: Influenza vaccine. This is recommended every year. Tetanus, diphtheria, and acellular pertussis (Tdap, Td) vaccine. You may need a Td booster every 10 years. Zoster vaccine. You may need this after age 73. Pneumococcal 13-valent conjugate (PCV13) vaccine. One dose is recommended after age 73. Pneumococcal polysaccharide (PPSV23) vaccine. One dose is recommended after age 73. Talk to your health care provider about which screenings and vaccines you need and how often you need  them. This information is not intended to replace  advice given to you by your health care provider. Make sure you discuss any questions you have with your health care provider. Document Released: 09/04/2015 Document Revised: 04/27/2016 Document Reviewed: 06/09/2015 Elsevier Interactive Patient Education  2017 [place] Prevention in the Home Falls can cause injuries. They can happen to people of all ages. There are many things you can do to make your home safe and to help prevent falls. What can I do on the outside of my home? Regularly fix the edges of walkways and driveways and fix any cracks. Remove anything that might make you trip as you walk through a door, such as a raised step or threshold. Trim any bushes or trees on the path to your home. Use bright outdoor lighting. Clear any walking paths of anything that might make someone trip, such as rocks or tools. Regularly check to see if handrails are loose or broken. Make sure that both sides of any steps have handrails. Any raised decks and porches should have guardrails on the edges. Have any leaves, snow, or ice cleared regularly. Use sand or salt on walking paths during winter. Clean up any spills in your garage right away. This includes oil or grease spills. What can I do in the bathroom? Use night lights. Install grab bars by the toilet and in the tub and shower. Do not use towel bars as grab bars. Use non-skid mats or decals in the tub or shower. If you need to sit down in the shower, use a plastic, non-slip stool. Keep the floor dry. Clean up any water that spills on the floor as soon as it happens. Remove soap buildup in the tub or shower regularly. Attach bath mats securely with double-sided non-slip rug tape. Do not have throw rugs and other things on the floor that can make you trip. What can I do in the bedroom? Use night lights. Make sure that you have a light by your bed that is easy to reach. Do not use any sheets or blankets that are too big for your bed.  They should not hang down onto the floor. Have a firm chair that has side arms. You can use this for support while you get dressed. Do not have throw rugs and other things on the floor that can make you trip. What can I do in the kitchen? Clean up any spills right away. Avoid walking on wet floors. Keep items that you use a lot in easy-to-reach places. If you need to reach something above you, use a strong step stool that has a grab bar. Keep electrical cords out of the way. Do not use floor polish or wax that makes floors slippery. If you must use wax, use non-skid floor wax. Do not have throw rugs and other things on the floor that can make you trip. What can I do with my stairs? Do not leave any items on the stairs. Make sure that there are handrails on both sides of the stairs and use them. Fix handrails that are broken or loose. Make sure that handrails are as long as the stairways. Check any carpeting to make sure that it is firmly attached to the stairs. Fix any carpet that is loose or worn. Avoid having throw rugs at the top or bottom of the stairs. If you do have throw rugs, attach them to the floor with carpet tape. Make sure that you have a light switch at  the top of the stairs and the bottom of the stairs. If you do not have them, ask someone to add them for you. What else can I do to help prevent falls? Wear shoes that: Do not have high heels. Have rubber bottoms. Are comfortable and fit you well. Are closed at the toe. Do not wear sandals. If you use a stepladder: Make sure that it is fully opened. Do not climb a closed stepladder. Make sure that both sides of the stepladder are locked into place. Ask someone to hold it for you, if possible. Clearly mark and make sure that you can see: Any grab bars or handrails. First and last steps. Where the edge of each step is. Use tools that help you move around (mobility aids) if they are needed. These  include: Canes. Walkers. Scooters. Crutches. Turn on the lights when you go into a dark area. Replace any light bulbs as soon as they burn out. Set up your furniture so you have a clear path. Avoid moving your furniture around. If any of your floors are uneven, fix them. If there are any pets around you, be aware of where they are. Review your medicines with your doctor. Some medicines can make you feel dizzy. This can increase your chance of falling. Ask your doctor what other things that you can do to help prevent falls. This information is not intended to replace advice given to you by your health care provider. Make sure you discuss any questions you have with your health care provider. Document Released: 06/04/2009 Document Revised: 01/14/2016 Document Reviewed: 09/12/2014 Elsevier Interactive Patient Education  2017 [employer].

## 2022-11-28 NOTE — Progress Notes (Signed)
Cardiology Office Note Date:  07/06/2020  Patient ID:  Kaitlyn Adams, Kaitlyn Adams 08/04/1951, MRN [id number] PCP:  Sheliah Hatch, MD  Cardiologist:  Dr. [place]   Chief Complaint: PAT  History of Present Illness: Kaitlyn Adams is a 72 y.o. female with history of paroxysmal atrial fibrillation, PAT, HTN, HLD, hyperthyroidism s/p thyroidectomy with subsequent hypothyroidism, asthma, GERD, fibromyalgia,  mild nonobstructive CAD who presents for follow-up.  She was admitted 9/22-9/23/16 with complaints of chest discomfort as well as symptoms of recurrent atrial fibrillation including dyspnea, weakness/fatigue, and palpitations with near-syncope. Labs were notable for significant hypokalemia of 2.5 which was repleted.  Troponins were negative. She underwent cath showing mild nonobstructive CAD with 30% dLAD, 40% D1, 25% pLCx, 30% mRCA, 30% RPDA, normal EF >65% with normal LVEDP.  Lasix was stopped due to her hypokalemia. Labs otherwise notable for LDL 140. She had a 30-day event monitor which showed some episodes of PAT. No  AFib. She was started on Xarelto for [facility] of 3-4 (HTN, age, female, mild vascular disease). She was also started on atorvastatin. 2D echo 05/15/15: EF 65-70%, normal diastolic function. F/u labs 9/26 showed digoxin level 1.1 (digoxin decreased to 0.125mg  daily) and level came down to 0.6.   Patient was previously admitted in March 2020 with lightheadedness, she was orthostatic and appears to be dehydrated.  She also had hypokalemia.  Aldactone was discontinued and metoprolol reduced.  Cortisol level was low.  She was later started on Florinef.   She was seen in the ER 06/19/2022 after an episode of near syncope and lightheadedness causing her to lose balance and fall.  She has had multiple episodes.  She had not taken her potassium supplement in 2 days.  She was afebrile and not tachycardic or tachypneic and well-appearing.  She did have a white count of 19.1 but is on  chronic steroids for Addison's.  UA negative.  She was hypokalemic at 2.1 with evidence of AKI with a creatinine 2.21 from a baseline of 1.1.  She also reported poor p.o. intake.  She was given potassium supplement and IV fluids. She was ultimately discharged without admission.   PCP reduced toprol to 12.5 mg for bradycardia and hypotension.  She was orthostatic.   Later Toprol was discontinued altogether.  She had follow up with Micah Flesher PA - Echocardiogram was largely unremarkable. Heart monitor did not show any A-fib or flutter, no heart block or sustained pauses.     Past Medical History:  Diagnosis Date   Anemia    Asthma    - normal spirometry 2009 FEV1  >90% predicted.  - methacoline challenge neg 11/09   Bronchitis    recurrent   Cataract    Chronic cough    - sinus CT 03-23-10 > neg.  - allergy profile 03-23-10 > nl, IgE 14.  - flutter valve rx 03-23-10   Fibromyalgia    GERD (gastroesophageal reflux disease)    exacerbating VCD   Glaucoma    Hyperlipidemia    Hypertension    Hyperthyroidism    with hot nodule.  - total thyroidectomy 11-05-08 benign -> subsequent hypothyroidism.   Hypokalemia    Hypothyroidism    Hypothyroidism    a. following thyroidectomy.   Mild CAD    a. LHC 04/2015 - mild nonobstructive CAD with 30% dLAD, 40% D1, 25% pLCx, 30% mRCA, 30% RPDA, normal EF >65% with normal LVEDP.   Orthostatic hypotension    Osteoarthritis    Paroxysmal atrial  fibrillation (HCC)    Renal insufficiency    Vasomotor rhinitis    exacerbates VCD   Vocal cord dysfunction    proven on FOB 9/09    Past Surgical History:  Procedure Laterality Date   ABDOMINAL HYSTERECTOMY  1980   ABDOMINAL HYSTERECTOMY     BASAL CELL CARCINOMA EXCISION  11/08   CARDIAC CATHETERIZATION N/A 05/15/2015   Procedure: Left Heart Cath and Coronary Angiography;  Surgeon: Tonny BollmanMichael Cooper, MD;  [facility];  Service: Cardiovascular;  Laterality: N/A;   COLONOSCOPY N/A 04/27/2017   Dr  Lavon PaganiniNandigam for scant rectal bleeding and iron def anemia: Melanosis coli, lipomatous IC valve, left sided tics, non-bleeding hemorrhoid, 10 mm transverse polyp.     ESOPHAGOGASTRODUODENOSCOPY N/A 04/27/2017   Napoleon FormNandigam, Kavitha V, MD; [facility]. for iron def anemia.  Monilial/candidial esophagitis. Small HH.     EYE SURGERY     scar tissue removal  1982   THYROID SURGERY  2010   THYROIDECTOMY  11-05-08    Current Outpatient Medications  Medication Sig Dispense Refill   Acetaminophen (TYLENOL ARTHRITIS PAIN PO) Take 325 mg by mouth daily as needed (FOR MILD PAIN).      ALPRAZolam (XANAX) 0.5 MG tablet Take 1 tablet (0.5 mg total) by mouth 2 (two) times daily as needed for anxiety. 60 tablet 3   amitriptyline (ELAVIL) 50 MG tablet TAKE 1 TABLET(50 MG) BY MOUTH AT BEDTIME 90 tablet 0   atorvastatin (LIPITOR) 20 MG tablet TAKE 1 TABLET(20 MG) BY MOUTH DAILY 90 tablet 1   digoxin (LANOXIN) 0.125 MG tablet TAKE 1 TABLET BY MOUTH DAILY 90 tablet 0   fludrocortisone (FLORINEF) 0.1 MG tablet TAKE 1 TABLET(0.1 MG) BY MOUTH DAILY 90 tablet 1   furosemide (LASIX) 20 MG tablet Take 1 tablet (20 mg total) by mouth daily. 90 tablet 1   hydroxypropyl methylcellulose / hypromellose (ISOPTO TEARS / GONIOVISC) 2.5 % ophthalmic solution Place 2 drops into both eyes daily as needed for dry eyes.     levothyroxine (SYNTHROID) 50 MCG tablet Take 50 mcg by mouth every morning.     metoprolol succinate (TOPROL-XL) 25 MG 24 hr tablet TAKE 1 TABLET(25 MG) BY MOUTH DAILY 90 tablet 1   Multiple Vitamins-Minerals (MULTIVITAMIN ADULT PO) Take 1 tablet by mouth daily.     potassium chloride SA (K-DUR,KLOR-CON) 20 MEQ tablet TAKE 1 TABLET(20 MEQ) BY MOUTH DAILY (Patient taking differently: Take 60 mEq by mouth daily. ) 30 tablet 0   simethicone (MYLICON) 80 MG chewable tablet Chew 160 mg by mouth every 6 (six) hours as needed for flatulence.     No current facility-administered medications for this visit.    Allergies:    Fenofibrate, Erythromycin, Guaifenesin er, and Sulfonamide derivatives   Social History:  The patient  reports that she has never smoked. She has never used smokeless tobacco. She reports current alcohol use of about 1.0 standard drink of alcohol per week. She reports that she does not use drugs.   Family History:  The patient's family history includes Asthma in her grandchild and mother; Emphysema in her father; Heart disease in her father and mother; Irregular heart beat in her brother.  ROS:  Please see the history of present illness.  All other systems are reviewed and otherwise negative.   PHYSICAL EXAM:  VS:  BP 115/60   Pulse 70   Temp (!) 97.5 F (36.4 C)   Ht 5\' 3"  (1.6 m)   Wt 109 lb 6.4  oz (49.6 kg)   SpO2 99%   BMI 19.38 kg/m  BMI: Body mass index is 19.38 kg/m. GENERAL:  Well appearing, thin WF in NAD HEENT:  PERRL, EOMI, sclera are clear. Oropharynx is clear. NECK:  No jugular venous distention, carotid upstroke brisk and symmetric, no bruits, no thyromegaly or adenopathy LUNGS:  Clear to auscultation bilaterally CHEST:  Unremarkable HEART:  RRR,  PMI not displaced or sustained,S1 and S2 within normal limits, no S3, no S4: no clicks, no rubs, no murmurs ABD:  Soft, nontender. BS +, no masses or bruits. No hepatomegaly, no splenomegaly EXT:  2 + pulses throughout, no edema, no cyanosis no clubbing SKIN:  Warm and dry.  No rashes NEURO:  Alert and oriented x 3. Cranial nerves II through XII intact. PSYCH:  Cognitively intact  EKG: Is not done today.     Recent Labs: 05/13/2020: ALT 15; BUN 12; Creatinine, Ser 1.18; Hemoglobin 14.8; Platelets 314.0; Potassium 3.8; Sodium 138; TSH 3.59  04/07/2020: Cholesterol 180; HDL 47.10; LDL Cholesterol 111; Total CHOL/HDL Ratio 4; Triglycerides 110.0; VLDL 22.0   CrCl cannot be calculated (Patient's most recent lab result is older than the maximum 21 days allowed.).   Dated 10/22/21: dig level 0.9 Dated 09/22/22: creatinine 1.16.  otherwise CMET normal. TSH normal.   Wt Readings from Last 3 Encounters:  07/06/20 109 lb 6.4 oz (49.6 kg)  05/13/20 106 lb 2 oz (48.1 kg)  04/07/20 110 lb 8 oz (50.1 kg)     Other studies reviewed: Echo 05/26/20: IMPRESSIONS     1. Left ventricular ejection fraction, by estimation, is 65 to 70%. The  left ventricle has normal function. The left ventricle has no regional  wall motion abnormalities. Left ventricular diastolic parameters are  consistent with Grade I diastolic  dysfunction (impaired relaxation).   2. Right ventricular systolic function is normal. The right ventricular  size is normal. There is normal pulmonary artery systolic pressure. The  estimated right ventricular systolic pressure is 25.7 mmHg.   3. The mitral valve is normal in structure. Mild mitral valve  regurgitation. No evidence of mitral stenosis.   4. The aortic valve is normal in structure. Aortic valve regurgitation is  not visualized. No aortic stenosis is present.   5. The inferior vena cava is normal in size with greater than 50%  respiratory variability, suggesting right atrial pressure of 3 mmHg.   Event monitor 06/22/20: Study Highlights  Normal sinus rhythm Rare PACs. Single 11 beat run of PAT on 06/02/20 at 4:17 am Symptoms do not correlate with arrhythmia.   Heart monitor 07/22/22: Patient had a min HR of 66 bpm, max HR of 200 bpm, and avg HR of 81 bpm. Predominant underlying rhythm was Sinus Rhythm. Bundle Branch Block/IVCD was present. 63 Supraventricular Tachycardia runs occurred, the run with the fastest interval lasting 5  beats with a max rate of 200 bpm, the longest lasting 19 beats with an avg rate of 132 bpm. Isolated SVEs were rare (<1.0%), SVE Couplets were rare (<1.0%), and SVE Triplets were rare (<1.0%). Isolated VEs were rare (<1.0%), VE Couplets were rare  (<1.0%), and no VE Triplets were present.     Echo 07/18/22:  1. Left ventricular ejection fraction, by estimation, is 60  to 65%. Left  ventricular ejection fraction by 3D volume is 58 %. The left ventricle has  normal function. The left ventricle has no regional wall motion  abnormalities. Left ventricular diastolic   parameters were normal.  2. Right ventricular systolic function is normal. The right ventricular  size is normal. There is normal pulmonary artery systolic pressure. The  estimated right ventricular systolic pressure is 30.2 mmHg.   3. The mitral valve is normal in structure. Mild mitral valve  regurgitation. No evidence of mitral stenosis.   4. The aortic valve is normal in structure. Aortic valve regurgitation is  not visualized. No aortic stenosis is present.   5. The inferior vena cava is normal in size with greater than 50%  respiratory variability, suggesting right atrial pressure of 3 mmHg.   6. No significant change compared to study 05/26/2020   ASSESSMENT AND PLAN:  PAT with history of Pafib in 2016. Mostly PAT though. Not on anticoagulation given she has only had one documented episode of AFib and has a history of major GI bleed and falls.  Will continue dig. Off Toprol due to hypotension. Event monitor showed minimal PAT nonsustained.  Fatigue- chronic.  Orthostatic hypotension. Dx with Addison's disease. No CHF. Recommend she stop taking lasix daily and only use PRN. Can reduce potassium to 40 meq daily.  Hyperlipidemia -  on atorvastatin, unable to tolerate fenofibrate due to GI effects.  Mild CAD - continue risk reduction.    Disposition: F/u with me 6 months   Signed, Dennis Hegeman [place] MD, [facility] Hospital[facility]ACC   07/06/2020 3:17 PM     [facility] [address], [address] [place], [place] [address] Phone: [phone]

## 2022-12-05 ENCOUNTER — Other Ambulatory Visit: Payer: Self-pay

## 2022-12-05 DIAGNOSIS — F419 Anxiety disorder, unspecified: Secondary | ICD-10-CM

## 2022-12-05 MED ORDER — SERTRALINE HCL 25 MG PO TABS: 25.0000 mg | ORAL_TABLET | Freq: Every day | ORAL | 3 refills | Status: DC

## 2022-12-06 ENCOUNTER — Encounter: Payer: Self-pay | Admitting: Cardiology

## 2022-12-06 ENCOUNTER — Ambulatory Visit: Payer: Medicare Other | Attending: Cardiology | Admitting: Cardiology

## 2022-12-06 VITALS — BP 125/68 | HR 80 | Ht 63.0 in | Wt 101.8 lb

## 2022-12-06 DIAGNOSIS — I951 Orthostatic hypotension: Secondary | ICD-10-CM | POA: Diagnosis not present

## 2022-12-06 DIAGNOSIS — N289 Disorder of kidney and ureter, unspecified: Secondary | ICD-10-CM | POA: Diagnosis not present

## 2022-12-06 DIAGNOSIS — R0609 Other forms of dyspnea: Secondary | ICD-10-CM | POA: Insufficient documentation

## 2022-12-06 DIAGNOSIS — I4719 Other supraventricular tachycardia: Secondary | ICD-10-CM | POA: Diagnosis not present

## 2022-12-06 DIAGNOSIS — I2510 Atherosclerotic heart disease of native coronary artery without angina pectoris: Secondary | ICD-10-CM | POA: Insufficient documentation

## 2022-12-06 MED ORDER — POTASSIUM CHLORIDE CRYS ER 20 MEQ PO TBCR: 40.0000 meq | EXTENDED_RELEASE_TABLET | Freq: Every day | ORAL | 3 refills | Status: DC

## 2022-12-06 MED ORDER — FUROSEMIDE 20 MG PO TABS: 20.0000 mg | ORAL_TABLET | Freq: Every day | ORAL | 3 refills | Status: DC | PRN

## 2022-12-06 NOTE — Patient Instructions (Signed)
Stop daily lasix. Only take if having significant swelling  May reduce potassium to 40 meq daily  Continue your other therapy

## 2022-12-16 ENCOUNTER — Encounter: Payer: Self-pay | Admitting: Family Medicine

## 2022-12-16 ENCOUNTER — Ambulatory Visit (INDEPENDENT_AMBULATORY_CARE_PROVIDER_SITE_OTHER): Payer: Medicare Other | Admitting: Family Medicine

## 2022-12-16 ENCOUNTER — Other Ambulatory Visit: Payer: Self-pay

## 2022-12-16 ENCOUNTER — Inpatient Hospital Stay (HOSPITAL_BASED_OUTPATIENT_CLINIC_OR_DEPARTMENT_OTHER)
Admission: EM | Admit: 2022-12-16 | Discharge: 2022-12-18 | DRG: 812 | Disposition: A | Discharge status: Home / Self Care | Payer: Medicare Other | Attending: Family Medicine | Admitting: Family Medicine

## 2022-12-16 ENCOUNTER — Emergency Department (HOSPITAL_BASED_OUTPATIENT_CLINIC_OR_DEPARTMENT_OTHER): Payer: Medicare Other | Admitting: Radiology

## 2022-12-16 VITALS — BP 110/68 | HR 80 | Temp 98.0°F | Ht 63.0 in | Wt 101.9 lb

## 2022-12-16 DIAGNOSIS — E890 Postprocedural hypothyroidism: Secondary | ICD-10-CM | POA: Diagnosis present

## 2022-12-16 DIAGNOSIS — N183 Chronic kidney disease, stage 3 unspecified: Secondary | ICD-10-CM | POA: Diagnosis present

## 2022-12-16 DIAGNOSIS — F418 Other specified anxiety disorders: Secondary | ICD-10-CM | POA: Diagnosis not present

## 2022-12-16 DIAGNOSIS — E271 Primary adrenocortical insufficiency: Secondary | ICD-10-CM | POA: Diagnosis present

## 2022-12-16 DIAGNOSIS — K1379 Other lesions of oral mucosa: Secondary | ICD-10-CM

## 2022-12-16 DIAGNOSIS — Z8249 Family history of ischemic heart disease and other diseases of the circulatory system: Secondary | ICD-10-CM

## 2022-12-16 DIAGNOSIS — Z122 Encounter for screening for malignant neoplasm of respiratory organs: Secondary | ICD-10-CM | POA: Diagnosis not present

## 2022-12-16 DIAGNOSIS — D509 Iron deficiency anemia, unspecified: Secondary | ICD-10-CM | POA: Diagnosis not present

## 2022-12-16 DIAGNOSIS — I480 Paroxysmal atrial fibrillation: Secondary | ICD-10-CM | POA: Diagnosis present

## 2022-12-16 DIAGNOSIS — D123 Benign neoplasm of transverse colon: Secondary | ICD-10-CM | POA: Diagnosis present

## 2022-12-16 DIAGNOSIS — R1310 Dysphagia, unspecified: Secondary | ICD-10-CM | POA: Diagnosis not present

## 2022-12-16 DIAGNOSIS — R0609 Other forms of dyspnea: Secondary | ICD-10-CM

## 2022-12-16 DIAGNOSIS — R0902 Hypoxemia: Secondary | ICD-10-CM | POA: Diagnosis present

## 2022-12-16 DIAGNOSIS — D122 Benign neoplasm of ascending colon: Secondary | ICD-10-CM | POA: Diagnosis present

## 2022-12-16 DIAGNOSIS — K5909 Other constipation: Secondary | ICD-10-CM | POA: Diagnosis present

## 2022-12-16 DIAGNOSIS — Z79890 Hormone replacement therapy (postmenopausal): Secondary | ICD-10-CM | POA: Diagnosis not present

## 2022-12-16 DIAGNOSIS — Z85828 Personal history of other malignant neoplasm of skin: Secondary | ICD-10-CM | POA: Diagnosis not present

## 2022-12-16 DIAGNOSIS — K219 Gastro-esophageal reflux disease without esophagitis: Secondary | ICD-10-CM | POA: Diagnosis not present

## 2022-12-16 DIAGNOSIS — N1832 Chronic kidney disease, stage 3b: Secondary | ICD-10-CM | POA: Diagnosis not present

## 2022-12-16 DIAGNOSIS — F419 Anxiety disorder, unspecified: Secondary | ICD-10-CM | POA: Diagnosis present

## 2022-12-16 DIAGNOSIS — R12 Heartburn: Secondary | ICD-10-CM

## 2022-12-16 DIAGNOSIS — R059 Cough, unspecified: Secondary | ICD-10-CM | POA: Diagnosis not present

## 2022-12-16 DIAGNOSIS — I4510 Unspecified right bundle-branch block: Secondary | ICD-10-CM | POA: Diagnosis present

## 2022-12-16 DIAGNOSIS — D649 Anemia, unspecified: Secondary | ICD-10-CM | POA: Diagnosis not present

## 2022-12-16 DIAGNOSIS — I2510 Atherosclerotic heart disease of native coronary artery without angina pectoris: Secondary | ICD-10-CM | POA: Diagnosis present

## 2022-12-16 DIAGNOSIS — Z86010 Personal history of colonic polyps: Secondary | ICD-10-CM | POA: Diagnosis not present

## 2022-12-16 DIAGNOSIS — I4891 Unspecified atrial fibrillation: Secondary | ICD-10-CM | POA: Diagnosis not present

## 2022-12-16 DIAGNOSIS — E876 Hypokalemia: Secondary | ICD-10-CM | POA: Diagnosis present

## 2022-12-16 DIAGNOSIS — Z66 Do not resuscitate: Secondary | ICD-10-CM | POA: Diagnosis present

## 2022-12-16 DIAGNOSIS — Z90710 Acquired absence of both cervix and uterus: Secondary | ICD-10-CM | POA: Diagnosis not present

## 2022-12-16 DIAGNOSIS — K644 Residual hemorrhoidal skin tags: Secondary | ICD-10-CM | POA: Diagnosis present

## 2022-12-16 DIAGNOSIS — H409 Unspecified glaucoma: Secondary | ICD-10-CM | POA: Diagnosis present

## 2022-12-16 DIAGNOSIS — I4719 Other supraventricular tachycardia: Secondary | ICD-10-CM | POA: Diagnosis present

## 2022-12-16 DIAGNOSIS — I509 Heart failure, unspecified: Secondary | ICD-10-CM | POA: Diagnosis present

## 2022-12-16 DIAGNOSIS — D126 Benign neoplasm of colon, unspecified: Secondary | ICD-10-CM | POA: Diagnosis not present

## 2022-12-16 DIAGNOSIS — J45909 Unspecified asthma, uncomplicated: Secondary | ICD-10-CM | POA: Diagnosis present

## 2022-12-16 DIAGNOSIS — Z881 Allergy status to other antibiotic agents status: Secondary | ICD-10-CM | POA: Diagnosis not present

## 2022-12-16 DIAGNOSIS — K3189 Other diseases of stomach and duodenum: Secondary | ICD-10-CM | POA: Diagnosis not present

## 2022-12-16 DIAGNOSIS — B3781 Candidal esophagitis: Secondary | ICD-10-CM | POA: Diagnosis present

## 2022-12-16 DIAGNOSIS — I130 Hypertensive heart and chronic kidney disease with heart failure and stage 1 through stage 4 chronic kidney disease, or unspecified chronic kidney disease: Secondary | ICD-10-CM | POA: Diagnosis present

## 2022-12-16 DIAGNOSIS — K6389 Other specified diseases of intestine: Secondary | ICD-10-CM | POA: Diagnosis present

## 2022-12-16 DIAGNOSIS — Z7951 Long term (current) use of inhaled steroids: Secondary | ICD-10-CM

## 2022-12-16 DIAGNOSIS — Z888 Allergy status to other drugs, medicaments and biological substances status: Secondary | ICD-10-CM

## 2022-12-16 DIAGNOSIS — M797 Fibromyalgia: Secondary | ICD-10-CM | POA: Diagnosis present

## 2022-12-16 DIAGNOSIS — Z825 Family history of asthma and other chronic lower respiratory diseases: Secondary | ICD-10-CM

## 2022-12-16 DIAGNOSIS — R0602 Shortness of breath: Secondary | ICD-10-CM | POA: Diagnosis not present

## 2022-12-16 DIAGNOSIS — K624 Stenosis of anus and rectum: Secondary | ICD-10-CM | POA: Diagnosis not present

## 2022-12-16 DIAGNOSIS — E2740 Unspecified adrenocortical insufficiency: Secondary | ICD-10-CM | POA: Diagnosis not present

## 2022-12-16 DIAGNOSIS — K5140 Inflammatory polyps of colon without complications: Secondary | ICD-10-CM | POA: Diagnosis not present

## 2022-12-16 DIAGNOSIS — E785 Hyperlipidemia, unspecified: Secondary | ICD-10-CM | POA: Diagnosis present

## 2022-12-16 DIAGNOSIS — Z882 Allergy status to sulfonamides status: Secondary | ICD-10-CM

## 2022-12-16 DIAGNOSIS — Z9889 Other specified postprocedural states: Secondary | ICD-10-CM | POA: Diagnosis not present

## 2022-12-16 DIAGNOSIS — Z79899 Other long term (current) drug therapy: Secondary | ICD-10-CM

## 2022-12-16 LAB — CBC WITH DIFFERENTIAL/PLATELET
Abs Immature Granulocytes: 0.06 10*3/uL (ref 0.00–0.07)
Basophils Absolute: 0 10*3/uL (ref 0.0–0.1)
Basophils Relative: 0 %
Eosinophils Absolute: 0.1 10*3/uL (ref 0.0–0.5)
Eosinophils Relative: 0 %
HCT: 21.3 % — ABNORMAL LOW (ref 36.0–46.0)
Hemoglobin: 5.6 g/dL — CL (ref 12.0–15.0)
Immature Granulocytes: 1 %
Lymphocytes Relative: 14 %
Lymphs Abs: 1.7 10*3/uL (ref 0.7–4.0)
MCH: 18.1 pg — ABNORMAL LOW (ref 26.0–34.0)
MCHC: 26.3 g/dL — ABNORMAL LOW (ref 30.0–36.0)
MCV: 68.7 fL — ABNORMAL LOW (ref 80.0–100.0)
Monocytes Absolute: 0.7 10*3/uL (ref 0.1–1.0)
Monocytes Relative: 6 %
Neutro Abs: 9.6 10*3/uL — ABNORMAL HIGH (ref 1.7–7.7)
Neutrophils Relative %: 79 %
Platelets: 586 10*3/uL — ABNORMAL HIGH (ref 150–400)
RBC: 3.1 MIL/uL — ABNORMAL LOW (ref 3.87–5.11)
RDW: 20.1 % — ABNORMAL HIGH (ref 11.5–15.5)
WBC: 12 10*3/uL — ABNORMAL HIGH (ref 4.0–10.5)
nRBC: 0 % (ref 0.0–0.2)

## 2022-12-16 LAB — IRON AND TIBC
Iron: <5 ug/dL — ABNORMAL LOW (ref 28–170)
TIBC: 396 ug/dL (ref 250–450)

## 2022-12-16 LAB — TYPE AND SCREEN
ABO/RH(D): O POS
Antibody Screen: NEGATIVE

## 2022-12-16 LAB — TROPONIN I (HIGH SENSITIVITY)
Troponin I (High Sensitivity): 18 ng/L — ABNORMAL HIGH (ref <18)
Troponin I (High Sensitivity): 21 ng/L — ABNORMAL HIGH (ref <18)

## 2022-12-16 LAB — RETICULOCYTES
Immature Retic Fract: 7.4 % (ref 2.3–15.9)
RBC.: 2.89 MIL/uL — ABNORMAL LOW (ref 3.87–5.11)
Retic Count, Absolute: 89 10*3/uL (ref 19.0–186.0)
Retic Ct Pct: 2.9 % (ref 0.4–3.1)

## 2022-12-16 LAB — BRAIN NATRIURETIC PEPTIDE: B Natriuretic Peptide: 595.8 pg/mL — ABNORMAL HIGH (ref 0.0–100.0)

## 2022-12-16 LAB — BASIC METABOLIC PANEL
Anion gap: 11 (ref 5–15)
BUN: 14 mg/dL (ref 8–23)
CO2: 16 mmol/L — ABNORMAL LOW (ref 22–32)
Calcium: 9.7 mg/dL (ref 8.9–10.3)
Chloride: 111 mmol/L (ref 98–111)
Creatinine, Ser: 1.64 mg/dL — ABNORMAL HIGH (ref 0.44–1.00)
GFR, Estimated: 33 mL/min — ABNORMAL LOW (ref >60)
Glucose, Bld: 127 mg/dL — ABNORMAL HIGH (ref 70–99)
Potassium: 2.6 mmol/L — CL (ref 3.5–5.1)
Sodium: 138 mmol/L (ref 135–145)

## 2022-12-16 LAB — MAGNESIUM: Magnesium: 1.3 mg/dL — ABNORMAL LOW (ref 1.7–2.4)

## 2022-12-16 LAB — FERRITIN: Ferritin: 3 ng/mL — ABNORMAL LOW (ref 11–307)

## 2022-12-16 LAB — FOLATE: Folate: 19.4 ng/mL (ref >5.9)

## 2022-12-16 LAB — BPAM RBC: Blood Product Expiration Date: 202406012359

## 2022-12-16 LAB — VITAMIN B12: Vitamin B-12: 214 pg/mL (ref 180–914)

## 2022-12-16 LAB — D-DIMER, QUANTITATIVE: D-Dimer, Quant: 0.68 ug/mL-FEU — ABNORMAL HIGH (ref 0.00–0.50)

## 2022-12-16 LAB — DIGOXIN LEVEL: Digoxin Level: 0.3 ng/mL — ABNORMAL LOW (ref 0.8–2.0)

## 2022-12-16 LAB — PREPARE RBC (CROSSMATCH)

## 2022-12-16 MED ORDER — POTASSIUM CHLORIDE CRYS ER 20 MEQ PO TBCR
40.0000 meq | EXTENDED_RELEASE_TABLET | Freq: Once | ORAL | Status: AC
  Administered 2022-12-16: 40 meq via ORAL
  Filled 2022-12-16: qty 2

## 2022-12-16 MED ORDER — MAGIC MOUTHWASH W/LIDOCAINE: 5.0000 mL | Freq: Four times a day (QID) | ORAL | 0 refills | Status: DC | PRN

## 2022-12-16 MED ORDER — PANTOPRAZOLE SODIUM 40 MG PO TBEC: 40.0000 mg | DELAYED_RELEASE_TABLET | Freq: Every day | ORAL | 3 refills | Status: DC

## 2022-12-16 MED ORDER — MAGNESIUM SULFATE 2 GM/50ML IV SOLN
2.0000 g | Freq: Once | INTRAVENOUS | Status: AC
  Administered 2022-12-16: 2 g via INTRAVENOUS
  Filled 2022-12-16: qty 50

## 2022-12-16 MED ORDER — IPRATROPIUM-ALBUTEROL 0.5-2.5 (3) MG/3ML IN SOLN
3.0000 mL | Freq: Once | RESPIRATORY_TRACT | Status: AC
  Administered 2022-12-16: 3 mL via RESPIRATORY_TRACT
  Filled 2022-12-16: qty 3

## 2022-12-16 MED ORDER — ALBUTEROL SULFATE HFA 108 (90 BASE) MCG/ACT IN AERS: 2.0000 | INHALATION_SPRAY | RESPIRATORY_TRACT | Status: DC | PRN

## 2022-12-16 MED ORDER — SODIUM CHLORIDE 0.9% IV SOLUTION: Freq: Once | INTRAVENOUS | Status: AC

## 2022-12-16 NOTE — ED Provider Notes (Signed)
EMERGENCY DEPARTMENT AT [facility] Provider Note   CSN: [id number] Arrival date & time: 12/16/22  1300     History  Chief Complaint  Patient presents with   Shortness of Breath    Kaitlyn Adams is a 72 y.o. female.  HPI    72 y.o. female with history of paroxysmal atrial fibrillation, HTN, HLD, mitral valve disease, CHF comes in with chief complaint of shortness of breath.  Patient accompanied by her husband as well.  According the patient, she has been having shortness of breath for the last couple of months.  She notices shortness of breath with exertion primarily, and over the course of 2 months less and less exertion gets her short of breath.  She also has nighttime cough, but denies any orthopnea or PND.   Patient denies any recent illnesses, blood loss, chest pain.  She went to see her PCP, over there her ambulatory pulse ox was noted to be in the 80s, and she was advised to come to the emergency room.  Home Medications Prior to Admission medications   Medication Sig Start Date End Date Taking? Authorizing Provider  Acetaminophen (TYLENOL ARTHRITIS PAIN PO) Take 325 mg by mouth daily as needed (FOR MILD PAIN).    [provider]  albuterol (VENTOLIN HFA) 108 (90 Base) MCG/ACT inhaler INHALE 2 PUFFS INTO THE LUNGS EVERY 6 HOURS AS NEEDED FOR WHEEZING OR SHORTNESS OF BREATH 11/08/22   Sheliah Hatch, MD  ALPRAZolam Prudy Feeler) 0.25 MG tablet Take 1 tablet (0.25 mg total) by mouth 2 (two) times daily as needed for anxiety. 07/12/22   Sheliah Hatch, MD  amitriptyline (ELAVIL) 50 MG tablet Take one tablet at bedtime 11/08/22   Sheliah Hatch, MD  atorvastatin (LIPITOR) 20 MG tablet TAKE 1 TABLET(20 MG) BY MOUTH DAILY 10/10/22   Sheliah Hatch, MD  Budesonide (PULMICORT FLEXHALER) 90 MCG/ACT inhaler Inhale 2 puffs into the lungs 2 (two) times daily. 10/18/22   Sheliah Hatch, MD  digoxin (LANOXIN) 0.125 MG tablet Take 1 tablet  (0.125 mg total) by mouth daily. 09/14/22   [place], Peter M, MD  fludrocortisone (FLORINEF) 0.1 MG tablet Take one tablet daily 10/10/22   Sheliah Hatch, MD  furosemide (LASIX) 20 MG tablet Take 1 tablet (20 mg total) by mouth daily as needed for edema. 12/06/22   [place], Peter M, MD  hydrocortisone (CORTEF) 10 MG tablet Take 10 mg by mouth 2 (two) times daily. Patient reports taking BID 09/07/2022 09/30/20   [provider]  hydroxypropyl methylcellulose / hypromellose (ISOPTO TEARS / GONIOVISC) 2.5 % ophthalmic solution Place 2 drops into both eyes daily as needed for dry eyes.    [provider]  levothyroxine (SYNTHROID) 25 MCG tablet Take 25 mcg by mouth every morning. 08/16/22   [provider]  magic mouthwash w/lidocaine SOLN Take 5 mLs by mouth 4 (four) times daily as needed for mouth pain. 12/16/22   Shade Flood, MD  pantoprazole (PROTONIX) 40 MG tablet Take 1 tablet (40 mg total) by mouth daily. 12/16/22   Shade Flood, MD  potassium chloride SA (KLOR-CON M) 20 MEQ tablet Take 2 tablets (40 mEq total) by mouth daily. 12/06/22   [place], Peter M, MD  sertraline (ZOLOFT) 25 MG tablet Take 1 tablet (25 mg total) by mouth daily. 12/05/22   Sheliah Hatch, MD      Allergies    Fenofibrate, Erythromycin, Guaifenesin er, and Sulfonamide derivatives  Review of Systems   Review of Systems  All other systems reviewed and are negative.   Physical Exam Updated Vital Signs BP (!) 104/50 (BP Location: Left Arm)   Pulse 83   Temp 98.5 F (36.9 C) (Oral)   Resp 18   SpO2 100%  Physical Exam Vitals and nursing note reviewed.  Constitutional:      Appearance: She is well-developed.  HENT:     Head: Atraumatic.  Cardiovascular:     Rate and Rhythm: Normal rate.     Heart sounds: Murmur heard.  Pulmonary:     Effort: Pulmonary effort is normal.     Breath sounds: No decreased breath sounds, wheezing, rhonchi or rales.  Musculoskeletal:      Cervical back: Normal range of motion and neck supple.     Right lower leg: No tenderness. No edema.     Left lower leg: No tenderness. No edema.  Skin:    General: Skin is warm and dry.  Neurological:     Mental Status: She is alert and oriented to person, place, and time.     ED Results / Procedures / Treatments   Labs (all labs ordered are listed, but only abnormal results are displayed) Labs Reviewed  BASIC METABOLIC PANEL - Abnormal; Notable for the following components:      Result Value   Potassium 2.6 (*)    CO2 16 (*)    Glucose, Bld 127 (*)    Creatinine, Ser 1.64 (*)    GFR, Estimated 33 (*)    All other components within normal limits  CBC WITH DIFFERENTIAL/PLATELET - Abnormal; Notable for the following components:   WBC 12.0 (*)    RBC 3.10 (*)    Hemoglobin 5.6 (*)    HCT 21.3 (*)    MCV 68.7 (*)    MCH 18.1 (*)    MCHC 26.3 (*)    RDW 20.1 (*)    Platelets 586 (*)    All other components within normal limits  D-DIMER, QUANTITATIVE - Abnormal; Notable for the following components:   D-Dimer, Quant 0.68 (*)    All other components within normal limits  BRAIN NATRIURETIC PEPTIDE  DIGOXIN LEVEL  VITAMIN B12  FOLATE  IRON AND TIBC  FERRITIN  RETICULOCYTES  MAGNESIUM  POC OCCULT BLOOD, ED  TROPONIN I (HIGH SENSITIVITY)    EKG EKG Interpretation  Date/Time:  Friday December 16 2022 13:42:59 EDT Ventricular Rate:  81 PR Interval:  168 QRS Duration: 145 QT Interval:  348 QTC Calculation: 404 R Axis:   86 Text Interpretation: Sinus rhythm Right bundle branch block Repol abnrm suggests ischemia, diffuse leads ST depression in antero lateral leads not new Confirmed by Derwood Kaplan ([id number]) on 12/16/2022 2:38:54 PM  Radiology DG Chest 2 View  Result Date: 12/16/2022 CLINICAL DATA:  Shortness of breath EXAM: CHEST - 2 VIEW COMPARISON:  CXR 10/18/22 FINDINGS: No pleural effusion. No pneumothorax. No focal airspace opacity. Normal cardiac and mediastinal  contours. No radiographically apparent displaced rib fractures. Visualized upper abdomen is unremarkable. Vertebral body heights are maintained. IMPRESSION: No focal airspace opacity. Electronically Signed   By: Lorenza Cambridge M.D.   On: 12/16/2022 13:51    Procedures .Critical Care  Performed by: Derwood Kaplan, MD Authorized by: Derwood Kaplan, MD   Critical care provider statement:    Critical care time (minutes):  41   Critical care was necessary to treat or prevent imminent or life-threatening deterioration of the following conditions:  Circulatory  failure   Critical care was time spent personally by me on the following activities:  Development of treatment plan with patient or surrogate, discussions with consultants, evaluation of patient's response to treatment, examination of patient, ordering and review of laboratory studies, ordering and review of radiographic studies, ordering and performing treatments and interventions, pulse oximetry, re-evaluation of patient's condition, review of old charts and obtaining history from patient or surrogate     Medications Ordered in ED Medications  albuterol (VENTOLIN HFA) 108 (90 Base) MCG/ACT inhaler 2 puff (has no administration in time range)  potassium chloride SA (KLOR-CON M) CR tablet 40 mEq (has no administration in time range)  ipratropium-albuterol (DUONEB) 0.5-2.5 (3) MG/3ML nebulizer solution 3 mL (3 mLs Nebulization Given 12/16/22 1352)    ED Course/ Medical Decision Making/ A&P Clinical Course as of 12/16/22 1532  Fri Dec 16, 2022  1531 Hemoglobin(!!): 5.6 Hemoglobin is 5.6.  Patient continues to deny melena or bloody stools. Likely the cause for her exertional dyspnea.  Other labs are pending.  Dr. Jacqulyn Bath to take over.  Patient made aware of the diagnosis of anemia.  She is aware that she will be admitted to the hospital where she will require blood transfusion. [AN]    Clinical Course User Index [AN] Derwood Kaplan, MD                              Medical Decision Making Amount and/or Complexity of Data Reviewed Labs: ordered. Decision-making details documented in ED Course. Radiology: ordered.  Risk Prescription drug management. Decision regarding hospitalization.   72 year old female comes in with chief complaint of exertional shortness of breath.  She has past medical history of CAD, cardiomyopathy, CHF.  History provided by patient.  Substantial part of the history was also provided by patient's husband was at the bedside.  I have reviewed patient's prior outpatient visits, including recent visit with cardiology and reviewed cardiac workup including echocardiogram of the patient that was done within the last 6 months.  Differential diagnosis for this patient includes acute coronary syndrome with stable angina, congestive heart failure, valve disorder, severe anemia, PE, pleural effusion, symptomatic A-fib.  Basic labs, EKG and chest x-ray ordered. Exam not consistent with florid volume overload.  She had a mild wheeze per respiratory therapy, for which patient is getting fluids.  Ambulatory pulse ox from the waiting room to the ER room did not reveal any hypoxia per nursing staff.  Final Clinical Impression(s) / ED Diagnoses Final diagnoses:  Symptomatic anemia  Acute hypokalemia    Rx / DC Orders ED Discharge Orders     None         Derwood Kaplan, MD 12/16/22 [phone]

## 2022-12-16 NOTE — ED Triage Notes (Signed)
Arrives POV from PCP.  Complains of cough x several months. Seen at PCP for same- attributed to heartburn. PCP found pt to desaturate while ambulating- mid 80's- sent here for further eval. Pt ambulatory, no signs of cyanosis, NAD.

## 2022-12-16 NOTE — ED Provider Notes (Signed)
Blood pressure (!) 104/50, pulse 83, temperature 98.5 F (36.9 C), temperature source Oral, resp. rate 18, SpO2 100 %.  Assuming care from Dr. Rhunette Croft.  In short, Kaitlyn Adams is a 72 y.o. female with a chief complaint of Shortness of Breath .  Refer to the original H&P for additional details.  The current plan of care is to follow up on remaining labs and admit. No GI bleeding history or symptoms. Found to have anemia here to 5.6 likely contributing to symptoms. Will ultimately require admit for PRBC transfusion and anemia workup.    EKG Interpretation  Date/Time:  Friday December 16 2022 13:42:59 EDT Ventricular Rate:  81 PR Interval:  168 QRS Duration: 145 QT Interval:  348 QTC Calculation: 404 R Axis:   86 Text Interpretation: Sinus rhythm Right bundle branch block Repol abnrm suggests ischemia, diffuse leads ST depression in antero lateral leads not new Confirmed by Derwood Kaplan [phone]) on 12/16/2022 2:38:54 PM       Patient's K is also slightly low at 2.6. Plan for PO replacement.   Discussed patient's case with [facility] to request admission. Patient and family (if present) updated with plan.   07:26 PM  Patient awaiting transportation. Has a bed assigned and should be leaving soon. No hemodynamic change to require emergency release blood at this time. No crossmatched blood available at this facility.    Maia Plan, MD 12/16/22 [phone]

## 2022-12-16 NOTE — Progress Notes (Signed)
Subjective:  Patient ID: Kaitlyn Adams, female    DOB: August 15, 1951  Age: 72 y.o. MRN: [id number]  CC:  Chief Complaint  Patient presents with   Cough    Cough, trouble breathing, inside mouth pain, headache. Winded moving from room to room. Several months     HPI Kaitlyn Adams presents for  Here with spouse Cough: With dyspnea on exertion. Persistent cough - reports croupy cough at times, same cough past few months. Short of breath with cough and any exertion. Limits activity.  No chest pains other than soreness with cough.  No fever.  Persistent heartburn during day - frequent TUMS during the day.  Using pulmicort flexhaler - not using often. Costly. Only intermittent 2 puffs twice yesterday - not sure how much it helped. Typically few days per week.  Using albuterol 2-3 times per day - some help.  Has not seen pulmonary.  No new leg swelling.  Sore sensation inside mouth. No sores seen. Noted past month. No coating.  No recent PND or rhinorrhea.  Last CXR 2/27  - no active cardiopulmonary disease.   Primary care provider Dr. Beverely Low.  Problem list reviewed, history of paroxysmal A-fib, CAD, paroxysmal atrial tachycardia, upper airway cough syndrome.  Treated for COPD exacerbation in February and RSV bronchiolitis.  Seen by other provider March 26 with complaints of cough and difficulty breathing, associated rib pain.  She was compliant with her Pulmicort twice daily at that time, and using inhaler 1-2 times per day.  ER evaluation was recommended at that time.  She did not go.  Most recent visit with her cardiologist, Dr. Swaziland on April 16.She is on digoxin and Xarelto for A-fib.   Event monitor a few months ago with minimal PAT, nonsustained.  Echo with EF 58% in November 2023.  Per cardiology note, no CHF, recommended stopping Lasix at her April 16 visit and only use as needed, reduce potassium to 40 mEq daily.  History of Addison's disease on chronic steroids -  fludrocortisone and hydrocortisone.  Mild CAD with planned risk reduction.  72-month follow-up.  Has backed off lasix - as needed - only 1-2 times past week or so. On lower potassium.   History Patient Active Problem List   Diagnosis Date Noted   Adrenal insufficiency (HCC) 11/06/2020   Disorder of bone 11/06/2020   Disorder of calcium metabolism 11/06/2020   Vitamin D deficiency 11/06/2020   Hypokalemia 10/25/2018   Moderate protein-calorie malnutrition (HCC) 08/01/2018   Upper airway cough syndrome 05/22/2017   Symptomatic anemia 04/26/2017   Chronic constipation 07/07/2016   History of colonic polyps 07/07/2016   Chronic anticoagulation 07/07/2016   Iron deficiency anemia 06/15/2016   Anxiety and depression 05/18/2016   Physical exam 11/18/2015   Pain in joint, ankle and foot 11/16/2015   PAT (paroxysmal atrial tachycardia) 09/08/2015   Hyperlipidemia 07/30/2015   CAD (coronary artery disease)    Chest pain at rest, not due to CAD, possible due to a fib. 05/15/2015   Chronic bronchitis (HCC) 06/17/2011   Orthostatic hypotension 12/06/2010   Paroxysmal atrial fibrillation (HCC)    Renal insufficiency    Myalgia    Hypothyroidism    Glaucoma    GERD, SEVERE 08/19/2010   Osteoarthritis 04/17/2008   FIBROMYALGIA 04/17/2008   Past Medical History:  Diagnosis Date   Addison disease (HCC)    Anemia    Asthma    - normal spirometry 2009 FEV1  >90% predicted.  - methacoline challenge  neg 11/09   Bronchitis    recurrent   Cataract    Chronic cough    - sinus CT 03-23-10 > neg.  - allergy profile 03-23-10 > nl, IgE 14.  - flutter valve rx 03-23-10   Fibromyalgia    GERD (gastroesophageal reflux disease)    exacerbating VCD   Glaucoma    History of blood transfusion    Hyperlipidemia    Hypertension    Hyperthyroidism    with hot nodule.  - total thyroidectomy 11-05-08 benign -> subsequent hypothyroidism.   Hypokalemia    Hypothyroidism    Hypothyroidism    a. following  thyroidectomy.   Mild CAD    a. LHC 04/2015 - mild nonobstructive CAD with 30% dLAD, 40% D1, 25% pLCx, 30% mRCA, 30% RPDA, normal EF >65% with normal LVEDP.   Orthostatic hypotension    Paroxysmal atrial fibrillation (HCC)    Renal insufficiency    Vasomotor rhinitis    exacerbates VCD   Vocal cord dysfunction    proven on FOB 9/09   Past Surgical History:  Procedure Laterality Date   ABDOMINAL HYSTERECTOMY  1980   ABDOMINAL HYSTERECTOMY     BASAL CELL CARCINOMA EXCISION  11/08   CARDIAC CATHETERIZATION N/A 05/15/2015   Procedure: Left Heart Cath and Coronary Angiography;  Surgeon: Tonny Bollman, MD;  Location: [facility] INVASIVE CV LAB;  Service: Cardiovascular;  Laterality: N/A;   COLONOSCOPY N/A 04/27/2017   Dr Lavon Paganini for scant rectal bleeding and iron def anemia: Melanosis coli, lipomatous IC valve, left sided tics, non-bleeding hemorrhoid, 10 mm transverse polyp.     ESOPHAGOGASTRODUODENOSCOPY N/A 04/27/2017   Napoleon Form, MD; [facility]. for iron def anemia.  Monilial/candidial esophagitis. Small HH.     EYE SURGERY     scar tissue removal  1982   THYROID SURGERY  2010   THYROIDECTOMY  11-05-08   Allergies  Allergen Reactions   Fenofibrate Other (See Comments)    GI side effects    Erythromycin Nausea And Vomiting    REACTION: nausea and vomiting   Guaifenesin Er Palpitations   Sulfonamide Derivatives Nausea And Vomiting    REACTION: nausea and vomiting   Prior to Admission medications   Medication Sig Start Date End Date Taking? Authorizing Provider  Acetaminophen (TYLENOL ARTHRITIS PAIN PO) Take 325 mg by mouth daily as needed (FOR MILD PAIN).   Yes [provider]  albuterol (VENTOLIN HFA) 108 (90 Base) MCG/ACT inhaler INHALE 2 PUFFS INTO THE LUNGS EVERY 6 HOURS AS NEEDED FOR WHEEZING OR SHORTNESS OF BREATH 11/08/22  Yes Sheliah Hatch, MD  ALPRAZolam Prudy Feeler) 0.25 MG tablet Take 1 tablet (0.25 mg total) by mouth 2 (two) times daily as needed for anxiety.  07/12/22  Yes Sheliah Hatch, MD  amitriptyline (ELAVIL) 50 MG tablet Take one tablet at bedtime 11/08/22  Yes Sheliah Hatch, MD  atorvastatin (LIPITOR) 20 MG tablet TAKE 1 TABLET(20 MG) BY MOUTH DAILY 10/10/22  Yes Sheliah Hatch, MD  Budesonide (PULMICORT FLEXHALER) 90 MCG/ACT inhaler Inhale 2 puffs into the lungs 2 (two) times daily. 10/18/22  Yes Sheliah Hatch, MD  digoxin (LANOXIN) 0.125 MG tablet Take 1 tablet (0.125 mg total) by mouth daily. 09/14/22  Yes [place], Peter M, MD  fludrocortisone (FLORINEF) 0.1 MG tablet Take one tablet daily 10/10/22  Yes Sheliah Hatch, MD  furosemide (LASIX) 20 MG tablet Take 1 tablet (20 mg total) by mouth daily as needed for edema. 12/06/22  Yes [place],  Demetria Pore, MD  hydrocortisone (CORTEF) 10 MG tablet Take 10 mg by mouth 2 (two) times daily. Patient reports taking BID 09/07/2022 09/30/20  Yes [provider]  hydroxypropyl methylcellulose / hypromellose (ISOPTO TEARS / GONIOVISC) 2.5 % ophthalmic solution Place 2 drops into both eyes daily as needed for dry eyes.   Yes [provider]  levothyroxine (SYNTHROID) 25 MCG tablet Take 25 mcg by mouth every morning. 08/16/22  Yes [provider]  potassium chloride SA (KLOR-CON M) 20 MEQ tablet Take 2 tablets (40 mEq total) by mouth daily. 12/06/22  Yes [place], Peter M, MD  sertraline (ZOLOFT) 25 MG tablet Take 1 tablet (25 mg total) by mouth daily. 12/05/22  Yes Sheliah Hatch, MD   Social History   Socioeconomic History   Marital status: Married    Spouse name: Not on file   Number of children: 1   Years of education: Not on file   Highest education level: 12th grade  Occupational History   Occupation: owns a Museum/gallery curator   Occupation: OWNER    Employer: [employer]  Tobacco Use   Smoking status: Never   Smokeless tobacco: Never  Vaping Use   Vaping Use: Never used  Substance and Sexual Activity   Alcohol use: Yes    Alcohol/week:  1.0 standard drink of alcohol    Types: 1 Glasses of wine per week    Comment: occasional wine or beer   Drug use: No   Sexual activity: Yes    Birth control/protection: None  Other Topics Concern   Not on file  Social History Narrative   Not on file   Social Determinants of Health   Financial Resource Strain: Low Risk  (11/17/2022)   Overall Financial Resource Strain (CARDIA)    Difficulty of Paying Living Expenses: Not hard at all  Food Insecurity: No Food Insecurity (11/17/2022)   Hunger Vital Sign    Worried About Running Out of Food in the Last Year: Never true    Ran Out of Food in the Last Year: Never true  Transportation Needs: No Transportation Needs (11/17/2022)   PRAPARE - Administrator, Civil Service (Medical): No    Lack of Transportation (Non-Medical): No  Physical Activity: Inactive (11/17/2022)   Exercise Vital Sign    Days of Exercise per Week: 0 days    Minutes of Exercise per Session: 0 min  Stress: No Stress Concern Present (11/17/2022)   [employer] of Occupational Health - Occupational Stress Questionnaire    Feeling of Stress : Not at all  Social Connections: Moderately Isolated (11/17/2022)   Social Connection and Isolation Panel [NHANES]    Frequency of Communication with Friends and Family: Three times a week    Frequency of Social Gatherings with Friends and Family: Three times a week    Attends Religious Services: Never    Active Member of Clubs or Organizations: No    Attends Banker Meetings: Never    Marital Status: Married  Catering manager Violence: Not At Risk (11/17/2022)   Humiliation, Afraid, Rape, and Kick questionnaire    Fear of Current or Ex-Partner: No    Emotionally Abused: No    Physically Abused: No    Sexually Abused: No    Review of Systems Per HPI   Objective:   Vitals:   12/16/22 0813  BP: 110/68  Pulse: 80  Temp: 98 F (36.7 C)  TempSrc: Temporal  SpO2: 97%  Weight:  101 lb 14.4 oz  (46.2 kg)  Height: 5\' 3"  (1.6 m)     Physical Exam Vitals reviewed.  Constitutional:      General: She is not in acute distress.    Appearance: Normal appearance. She is well-developed. She is not ill-appearing or diaphoretic.  HENT:     Head: Normocephalic and atraumatic.  Eyes:     Conjunctiva/sclera: Conjunctivae normal.     Pupils: Pupils are equal, round, and reactive to light.  Neck:     Vascular: No carotid bruit.  Cardiovascular:     Rate and Rhythm: Normal rate and regular rhythm.     Heart sounds: Normal heart sounds.  Pulmonary:     Effort: Pulmonary effort is normal. No respiratory distress.     Breath sounds: Normal breath sounds. No wheezing or rales.  Abdominal:     Palpations: Abdomen is soft. There is no pulsatile mass.     Tenderness: There is no abdominal tenderness.  Musculoskeletal:     Right lower leg: No edema.     Left lower leg: No edema.  Skin:    General: Skin is warm and dry.  Neurological:     Mental Status: She is alert and oriented to person, place, and time.  Psychiatric:        Mood and Affect: Mood normal.        Behavior: Behavior normal.    EKG: Sinus rhythm, right bundle branch block. No apparent acute changes or significant changes from June 19, 2022 EKG.  O2 sat 97% at rest, 84% with ambulation.  40 minutes spent during visit, including chart review, prior eval review and plan review, counseling and assimilation of information, exam, discussion of plan with reasinss for ER eval, and chart completion.    Assessment & Plan:  Kaitlyn Adams is a 72 y.o. female . Cough, unspecified type  DOE (dyspnea on exertion) - Plan: EKG 12-Lead  Mouth pain - Plan: magic mouthwash w/lidocaine SOLN  Heartburn - Plan: pantoprazole (PROTONIX) 40 MG tablet  Possible multiple contributors to her cough.  Suspect some component of upper airway cough syndrome with the persistent heartburn, reactive airway, chronic bronchitis per problem list  but I do not hear any wheeze on exam, overall lungs were clear.  History of A-fib but regular rhythm on exam, no apparent acute findings on EKG.  History of CAD, medically treated, denies chest pain other than with cough Mouth pain initially thought to be possible thrush with use of Pulmicort but I do not see any lesions within her mouth.  Magic mouthwash was prescribed.  Also prescribed Protonix and recommended over-the-counter Pepcid for heartburn and possible upper airway cough syndrome. Concerning hypoxia with ambulation with otherwise clear lungs in the office.  Although her cough has been persistent for some time, now with hypoxia on activity recommended ER evaluation for possible hospital mission, further testing, imaging likely.  Meds ordered this encounter  Medications   pantoprazole (PROTONIX) 40 MG tablet    Sig: Take 1 tablet (40 mg total) by mouth daily.    Dispense:  30 tablet    Refill:  3   magic mouthwash w/lidocaine SOLN    Sig: Take 5 mLs by mouth 4 (four) times daily as needed for mouth pain.    Dispense:  120 mL    Refill:  0    Ok to substitute ingredients per pharmacy usual "magic mouthwash" prep.   Patient Instructions  Cough may be due to multiple causes.  I suspect some of that may be from heartburn, and I did send in some medication, but I am concerned about the shortness of breath and oxygen dropping.  That is not from heartburn.  I do not hear any wheeze on your exam today, but again concerning that your oxygen is dropping with activity, especially with lungs sounding clear in the office.  I do think further testing is needed, and recommend evaluation through the ER today as you may need to be admitted for further evaluation and treatment.  Please proceed there today.   I do not see any signs of thrush in your mouth, but make sure to rinse out your mouth after use of Pulmicort inhaler, and more consistent use of the inhaler may be needed.  I did write for some Magic  mouthwash that can be used if they discharge you from the ER.  Can also discuss the symptoms in the hospital.  Hang in there and I hope you feel better soon.     Signed,   Meredith Staggers, MD [facility], [facility] 12/16/22 9:15 AM

## 2022-12-16 NOTE — ED Notes (Signed)
Ambulates to room 6 from triage on pulse ox. Remains 100% and HR 85. Steady on feet with guidance. Increased WOB not seen.

## 2022-12-16 NOTE — Patient Instructions (Addendum)
Cough may be due to multiple causes.  I suspect some of that may be from heartburn, and I did send in some medication, but I am concerned about the shortness of breath and oxygen dropping.  That is not from heartburn.  I do not hear any wheeze on your exam today, but again concerning that your oxygen is dropping with activity, especially with lungs sounding clear in the office.  I do think further testing is needed, and recommend evaluation through the ER today as you may need to be admitted for further evaluation and treatment.  Please proceed there today.   I do not see any signs of thrush in your mouth, but make sure to rinse out your mouth after use of Pulmicort inhaler, and more consistent use of the inhaler may be needed.  I did write for some Magic mouthwash that can be used if they discharge you from the ER.  Can also discuss the symptoms in the hospital.  Hang in there and I hope you feel better soon.

## 2022-12-16 NOTE — ED Notes (Signed)
Kaitlyn Adams at [facility] will send transport. Bed Ready at [facility] Room# [id number].-Abb(NS)

## 2022-12-16 NOTE — ED Notes (Signed)
Pt ambulated to the bathroom well.

## 2022-12-17 DIAGNOSIS — D509 Iron deficiency anemia, unspecified: Secondary | ICD-10-CM

## 2022-12-17 DIAGNOSIS — B3781 Candidal esophagitis: Secondary | ICD-10-CM | POA: Diagnosis present

## 2022-12-17 DIAGNOSIS — H409 Unspecified glaucoma: Secondary | ICD-10-CM | POA: Diagnosis present

## 2022-12-17 DIAGNOSIS — J45909 Unspecified asthma, uncomplicated: Secondary | ICD-10-CM | POA: Diagnosis present

## 2022-12-17 DIAGNOSIS — D123 Benign neoplasm of transverse colon: Secondary | ICD-10-CM | POA: Diagnosis present

## 2022-12-17 DIAGNOSIS — K5140 Inflammatory polyps of colon without complications: Secondary | ICD-10-CM | POA: Diagnosis not present

## 2022-12-17 DIAGNOSIS — D649 Anemia, unspecified: Secondary | ICD-10-CM

## 2022-12-17 DIAGNOSIS — N1832 Chronic kidney disease, stage 3b: Secondary | ICD-10-CM | POA: Diagnosis present

## 2022-12-17 DIAGNOSIS — Z9889 Other specified postprocedural states: Secondary | ICD-10-CM | POA: Diagnosis not present

## 2022-12-17 DIAGNOSIS — E876 Hypokalemia: Secondary | ICD-10-CM | POA: Diagnosis present

## 2022-12-17 DIAGNOSIS — K5909 Other constipation: Secondary | ICD-10-CM | POA: Diagnosis not present

## 2022-12-17 DIAGNOSIS — R12 Heartburn: Secondary | ICD-10-CM

## 2022-12-17 DIAGNOSIS — N183 Chronic kidney disease, stage 3 unspecified: Secondary | ICD-10-CM | POA: Diagnosis not present

## 2022-12-17 DIAGNOSIS — Z85828 Personal history of other malignant neoplasm of skin: Secondary | ICD-10-CM | POA: Diagnosis not present

## 2022-12-17 DIAGNOSIS — K644 Residual hemorrhoidal skin tags: Secondary | ICD-10-CM | POA: Diagnosis not present

## 2022-12-17 DIAGNOSIS — I2510 Atherosclerotic heart disease of native coronary artery without angina pectoris: Secondary | ICD-10-CM | POA: Diagnosis present

## 2022-12-17 DIAGNOSIS — K3189 Other diseases of stomach and duodenum: Secondary | ICD-10-CM | POA: Diagnosis not present

## 2022-12-17 DIAGNOSIS — Z122 Encounter for screening for malignant neoplasm of respiratory organs: Secondary | ICD-10-CM | POA: Diagnosis not present

## 2022-12-17 DIAGNOSIS — Z86010 Personal history of colonic polyps: Secondary | ICD-10-CM

## 2022-12-17 DIAGNOSIS — E2740 Unspecified adrenocortical insufficiency: Secondary | ICD-10-CM | POA: Diagnosis not present

## 2022-12-17 DIAGNOSIS — D126 Benign neoplasm of colon, unspecified: Secondary | ICD-10-CM | POA: Diagnosis not present

## 2022-12-17 DIAGNOSIS — E271 Primary adrenocortical insufficiency: Secondary | ICD-10-CM | POA: Diagnosis present

## 2022-12-17 DIAGNOSIS — M797 Fibromyalgia: Secondary | ICD-10-CM | POA: Diagnosis present

## 2022-12-17 DIAGNOSIS — K6389 Other specified diseases of intestine: Secondary | ICD-10-CM | POA: Diagnosis not present

## 2022-12-17 DIAGNOSIS — Z90710 Acquired absence of both cervix and uterus: Secondary | ICD-10-CM | POA: Diagnosis not present

## 2022-12-17 DIAGNOSIS — D122 Benign neoplasm of ascending colon: Secondary | ICD-10-CM | POA: Diagnosis present

## 2022-12-17 DIAGNOSIS — I4891 Unspecified atrial fibrillation: Secondary | ICD-10-CM | POA: Diagnosis not present

## 2022-12-17 DIAGNOSIS — I4719 Other supraventricular tachycardia: Secondary | ICD-10-CM | POA: Diagnosis present

## 2022-12-17 DIAGNOSIS — I480 Paroxysmal atrial fibrillation: Secondary | ICD-10-CM | POA: Diagnosis present

## 2022-12-17 DIAGNOSIS — R1310 Dysphagia, unspecified: Secondary | ICD-10-CM | POA: Diagnosis not present

## 2022-12-17 DIAGNOSIS — Z66 Do not resuscitate: Secondary | ICD-10-CM | POA: Diagnosis present

## 2022-12-17 DIAGNOSIS — K624 Stenosis of anus and rectum: Secondary | ICD-10-CM | POA: Diagnosis not present

## 2022-12-17 DIAGNOSIS — Z79890 Hormone replacement therapy (postmenopausal): Secondary | ICD-10-CM | POA: Diagnosis not present

## 2022-12-17 DIAGNOSIS — E890 Postprocedural hypothyroidism: Secondary | ICD-10-CM | POA: Diagnosis present

## 2022-12-17 DIAGNOSIS — I130 Hypertensive heart and chronic kidney disease with heart failure and stage 1 through stage 4 chronic kidney disease, or unspecified chronic kidney disease: Secondary | ICD-10-CM | POA: Diagnosis present

## 2022-12-17 DIAGNOSIS — E785 Hyperlipidemia, unspecified: Secondary | ICD-10-CM | POA: Diagnosis present

## 2022-12-17 DIAGNOSIS — Z881 Allergy status to other antibiotic agents status: Secondary | ICD-10-CM | POA: Diagnosis not present

## 2022-12-17 DIAGNOSIS — K219 Gastro-esophageal reflux disease without esophagitis: Secondary | ICD-10-CM | POA: Diagnosis not present

## 2022-12-17 DIAGNOSIS — I509 Heart failure, unspecified: Secondary | ICD-10-CM | POA: Diagnosis present

## 2022-12-17 DIAGNOSIS — F418 Other specified anxiety disorders: Secondary | ICD-10-CM | POA: Diagnosis not present

## 2022-12-17 LAB — BASIC METABOLIC PANEL
Anion gap: 7 (ref 5–15)
BUN: 13 mg/dL (ref 8–23)
CO2: 14 mmol/L — ABNORMAL LOW (ref 22–32)
Calcium: 9.3 mg/dL (ref 8.9–10.3)
Chloride: 112 mmol/L — ABNORMAL HIGH (ref 98–111)
Creatinine, Ser: 1.51 mg/dL — ABNORMAL HIGH (ref 0.44–1.00)
GFR, Estimated: 36 mL/min — ABNORMAL LOW (ref >60)
Glucose, Bld: 92 mg/dL (ref 70–99)
Potassium: 3.7 mmol/L (ref 3.5–5.1)
Sodium: 133 mmol/L — ABNORMAL LOW (ref 135–145)

## 2022-12-17 LAB — MAGNESIUM: Magnesium: 2 mg/dL (ref 1.7–2.4)

## 2022-12-17 LAB — TYPE AND SCREEN: Unit division: 0

## 2022-12-17 LAB — CBC
HCT: 29.1 % — ABNORMAL LOW (ref 36.0–46.0)
Hemoglobin: 7.9 g/dL — ABNORMAL LOW (ref 12.0–15.0)
MCH: 19.6 pg — ABNORMAL LOW (ref 26.0–34.0)
MCHC: 27.1 g/dL — ABNORMAL LOW (ref 30.0–36.0)
MCV: 72 fL — ABNORMAL LOW (ref 80.0–100.0)
Platelets: 432 10*3/uL — ABNORMAL HIGH (ref 150–400)
RBC: 4.04 MIL/uL (ref 3.87–5.11)
RDW: 21.9 % — ABNORMAL HIGH (ref 11.5–15.5)
WBC: 11.4 10*3/uL — ABNORMAL HIGH (ref 4.0–10.5)
nRBC: 0.2 % (ref 0.0–0.2)

## 2022-12-17 LAB — PHOSPHORUS: Phosphorus: 2.4 mg/dL — ABNORMAL LOW (ref 2.5–4.6)

## 2022-12-17 LAB — BPAM RBC

## 2022-12-17 MED ORDER — ONDANSETRON HCL 4 MG/2ML IJ SOLN: 4.0000 mg | Freq: Four times a day (QID) | INTRAMUSCULAR | Status: DC | PRN

## 2022-12-17 MED ORDER — ACETAMINOPHEN 325 MG PO TABS
650.0000 mg | ORAL_TABLET | Freq: Four times a day (QID) | ORAL | Status: DC | PRN
  Administered 2022-12-17: 650 mg via ORAL
  Filled 2022-12-17: qty 2

## 2022-12-17 MED ORDER — LEVOTHYROXINE SODIUM 25 MCG PO TABS
25.0000 ug | ORAL_TABLET | Freq: Every day | ORAL | Status: DC
  Administered 2022-12-17 – 2022-12-18 (×2): 25 ug via ORAL
  Filled 2022-12-17 (×2): qty 1

## 2022-12-17 MED ORDER — DIPHENHYDRAMINE HCL 25 MG PO CAPS: 25.0000 mg | ORAL_CAPSULE | Freq: Every evening | ORAL | Status: DC | PRN

## 2022-12-17 MED ORDER — BENZONATATE 100 MG PO CAPS
200.0000 mg | ORAL_CAPSULE | Freq: Three times a day (TID) | ORAL | Status: DC | PRN
  Administered 2022-12-17 – 2022-12-18 (×4): 200 mg via ORAL
  Filled 2022-12-17 (×4): qty 2

## 2022-12-17 MED ORDER — METOCLOPRAMIDE HCL 5 MG/ML IJ SOLN
10.0000 mg | Freq: Once | INTRAMUSCULAR | Status: AC
  Administered 2022-12-17: 10 mg via INTRAVENOUS
  Filled 2022-12-17: qty 2

## 2022-12-17 MED ORDER — SODIUM CHLORIDE 0.9 % IV SOLN
125.0000 mg | Freq: Once | INTRAVENOUS | Status: AC
  Administered 2022-12-17: 125 mg via INTRAVENOUS
  Filled 2022-12-17: qty 10

## 2022-12-17 MED ORDER — AMITRIPTYLINE HCL 25 MG PO TABS
50.0000 mg | ORAL_TABLET | Freq: Every day | ORAL | Status: DC
  Administered 2022-12-17: 50 mg via ORAL
  Filled 2022-12-17: qty 2

## 2022-12-17 MED ORDER — BISACODYL 5 MG PO TBEC
20.0000 mg | DELAYED_RELEASE_TABLET | Freq: Once | ORAL | Status: AC
  Administered 2022-12-17: 20 mg via ORAL
  Filled 2022-12-17: qty 4

## 2022-12-17 MED ORDER — DIPHENHYDRAMINE-APAP (SLEEP) 25-500 MG PO TABS: 1.0000 | ORAL_TABLET | Freq: Every evening | ORAL | Status: DC | PRN

## 2022-12-17 MED ORDER — SERTRALINE HCL 25 MG PO TABS
25.0000 mg | ORAL_TABLET | Freq: Every day | ORAL | Status: DC
  Administered 2022-12-17 – 2022-12-18 (×2): 25 mg via ORAL
  Filled 2022-12-17 (×2): qty 1

## 2022-12-17 MED ORDER — POLYVINYL ALCOHOL 1.4 % OP SOLN: 2.0000 [drp] | Freq: Every day | OPHTHALMIC | Status: DC | PRN

## 2022-12-17 MED ORDER — POTASSIUM PHOSPHATES 15 MMOLE/5ML IV SOLN
15.0000 mmol | Freq: Once | INTRAVENOUS | Status: AC
  Administered 2022-12-17: 15 mmol via INTRAVENOUS
  Filled 2022-12-17: qty 5

## 2022-12-17 MED ORDER — ONDANSETRON HCL 4 MG PO TABS: 4.0000 mg | ORAL_TABLET | Freq: Four times a day (QID) | ORAL | Status: DC | PRN

## 2022-12-17 MED ORDER — DIGOXIN 125 MCG PO TABS
0.1250 mg | ORAL_TABLET | Freq: Every day | ORAL | Status: DC
  Administered 2022-12-17 – 2022-12-18 (×2): 0.125 mg via ORAL
  Filled 2022-12-17 (×2): qty 1

## 2022-12-17 MED ORDER — FERROUS SULFATE 325 (65 FE) MG PO TABS
325.0000 mg | ORAL_TABLET | Freq: Two times a day (BID) | ORAL | Status: DC
  Administered 2022-12-17: 325 mg via ORAL
  Filled 2022-12-17: qty 1

## 2022-12-17 MED ORDER — ACETAMINOPHEN 500 MG PO TABS
500.0000 mg | ORAL_TABLET | Freq: Every evening | ORAL | Status: DC | PRN
  Filled 2022-12-17: qty 1

## 2022-12-17 MED ORDER — HYDROCORTISONE 10 MG PO TABS
10.0000 mg | ORAL_TABLET | Freq: Two times a day (BID) | ORAL | Status: DC
  Administered 2022-12-17 – 2022-12-18 (×4): 10 mg via ORAL
  Filled 2022-12-17 (×4): qty 1

## 2022-12-17 MED ORDER — ALPRAZOLAM 0.25 MG PO TABS: 0.2500 mg | ORAL_TABLET | Freq: Two times a day (BID) | ORAL | Status: DC | PRN

## 2022-12-17 MED ORDER — ATORVASTATIN CALCIUM 20 MG PO TABS
20.0000 mg | ORAL_TABLET | Freq: Every day | ORAL | Status: DC
  Administered 2022-12-17 – 2022-12-18 (×2): 20 mg via ORAL
  Filled 2022-12-17 (×2): qty 1

## 2022-12-17 MED ORDER — HYPROMELLOSE (GONIOSCOPIC) 2.5 % OP SOLN: 2.0000 [drp] | Freq: Every day | OPHTHALMIC | Status: DC | PRN

## 2022-12-17 MED ORDER — PEG-KCL-NACL-NASULF-NA ASC-C 100 G PO SOLR
0.5000 | Freq: Once | ORAL | Status: AC
  Administered 2022-12-17: 100 g via ORAL
  Filled 2022-12-17: qty 1

## 2022-12-17 MED ORDER — FLUDROCORTISONE ACETATE 0.1 MG PO TABS
0.1000 mg | ORAL_TABLET | Freq: Every day | ORAL | Status: DC
  Administered 2022-12-17 – 2022-12-18 (×2): 0.1 mg via ORAL
  Filled 2022-12-17 (×2): qty 1

## 2022-12-17 MED ORDER — CALCIUM CARBONATE ANTACID 500 MG PO CHEW: 1.0000 | CHEWABLE_TABLET | Freq: Two times a day (BID) | ORAL | Status: DC | PRN

## 2022-12-17 MED ORDER — PANTOPRAZOLE 80MG IVPB - SIMPLE MED
80.0000 mg | Freq: Two times a day (BID) | INTRAVENOUS | Status: DC
  Administered 2022-12-17 (×3): 80 mg via INTRAVENOUS
  Filled 2022-12-17: qty 100
  Filled 2022-12-17 (×3): qty 80
  Filled 2022-12-17: qty 100

## 2022-12-17 NOTE — H&P (View-Only) (Signed)
   Consultation  Referring Provider:     [facility] Primary Care Physician:  Tabori, Katherine E, MD Primary Gastroenterologist:      Dr. Nandigam Reason for Consultation:     Iron deficiency anemia     Impression / Plan:   Recurrent iron deficiency anemia in a person with history of colon polyps and negative workup otherwise (EGD, capsule endoscopy colonoscopy) 2018.  Some NSAID use (10 Aleve per month)  Dysphagia and heartburn symptomatology and a history of Candida esophagitis 2018  Chronic steroid therapy for Addison's disease which may be contributing to Candida esophagitis in the past question now  Chronic constipation on daily laxative (history of melanosis coli) ------------------------------------------------------------------------------------------------------------------  EGD and colonoscopy tomorrow if we can schedule.  Prep today.  Risks benefits and indications explained to the patient and her husband.  They understand and agreed to proceed.   Parenteral iron is appropriate in the patient if [facility] concurs.  Kaitlyn Adams E. Adonys Wildes, MD, FACG [facility] See AMION on call - gastroenterology for best contact person 12/17/2022 12:41 PM     HPI:   Kaitlyn Adams is a 72 y.o. female with a history of iron deficiency anemia and colon polyps plus Candida esophagitis who presents with recurrent iron deficiency anemia.  Also has hypertension and Addison's disease plus a chronic cough attributed to GERD.  She was seen recently with fatigue and generalized weakness and dyspnea on exertion and also chronic cough.  She has been having worsening dyspnea and cough problems saw primary care some and then went to primary care yesterday and was hypoxic and referred to the emergency department.  Hemoglobin was 5.6 MCV 68.  5 months ago she had a hemoglobin of 11.7 and MCV of 74 she had a hemoglobin of 12 6 months ago and an MCV of 75 and 2 years ago hemoglobin 15 MCV  83.  She was admitted to the hospital with iron deficiency anemia in 2018 and underwent GI evaluation.  EGD demonstrated Candida esophagitis.  Colonoscopy demonstrated a 10 mm adenoma and an abnormal looking ileocecal valve that was biopsied and showed adenoma.  A capsule endoscopy showed a single erosion in the small bowel.  She was treated with iron she took iron therapy orally for while after that but has not had any since.  She was referred to [facility] for removal of adenoma at ileocecal valve.  She did have 1 colonoscopy there on 06/01/2017.  I am unable to see the complete report but the ileocecal valve polyp was an 18 mm polyp and removed and was an adenoma.  There was also a 6 mm polyp in the ascending colon that was not removed given there were plans to follow-up with a colonoscopy in 3 to 6 months.  This was never done.  The patient denies any rectal bleeding or melena.  She does have mild dysphagia and significant heartburn symptomatology and a sore mouth.  She denies appetite change or weight loss or abdominal pain.  She continues to take a laxative pill daily to treat constipation which is chronic.  She did have melanosis coli at her previous colonoscopies.  She does report using 10 Aleve a month approximately.  She feels somewhat stronger after 2 units of packed red cells since admission.  Hemoglobin is now up to 7.9.   Lab Results  Component Value Date   WBC 11.4 (H) 12/17/2022   HGB 7.9 (L) 12/17/2022   HCT 29.1 (L) 12/17/2022   MCV 72.0 (  L) 12/17/2022   PLT 432 (H) 12/17/2022    Lab Results  Component Value Date   FERRITIN 3 (L) 12/16/2022   Lab Results  Component Value Date   VITAMINB12 214 12/16/2022   Lab Results  Component Value Date   FOLATE 19.4 12/16/2022    Past Medical History:  Diagnosis Date   Addison disease (HCC)    Anemia    Asthma    - normal spirometry 2009 FEV1  >90% predicted.  - methacoline challenge neg 11/09   Bronchitis    recurrent   Cataract     Chronic cough    - sinus CT 03-23-10 > neg.  - allergy profile 03-23-10 > nl, IgE 14.  - flutter valve rx 03-23-10   Fibromyalgia    GERD (gastroesophageal reflux disease)    exacerbating VCD   Glaucoma    History of blood transfusion    Hyperlipidemia    Hypertension    Hyperthyroidism    with hot nodule.  - total thyroidectomy 11-05-08 benign -> subsequent hypothyroidism.   Hypokalemia    Hypothyroidism    Hypothyroidism    a. following thyroidectomy.   Mild CAD    a. LHC 04/2015 - mild nonobstructive CAD with 30% dLAD, 40% D1, 25% pLCx, 30% mRCA, 30% RPDA, normal EF >65% with normal LVEDP.   Orthostatic hypotension    Paroxysmal atrial fibrillation (HCC)    Renal insufficiency    Vasomotor rhinitis    exacerbates VCD   Vocal cord dysfunction    proven on FOB 9/09    Past Surgical History:  Procedure Laterality Date   ABDOMINAL HYSTERECTOMY  1980   ABDOMINAL HYSTERECTOMY     BASAL CELL CARCINOMA EXCISION  11/08   CARDIAC CATHETERIZATION N/A 05/15/2015   Procedure: Left Heart Cath and Coronary Angiography;  Surgeon: Michael Cooper, MD;  Location: [facility] INVASIVE CV LAB;  Service: Cardiovascular;  Laterality: N/A;   COLONOSCOPY N/A 04/27/2017   Dr Nandigam for scant rectal bleeding and iron def anemia: Melanosis coli, lipomatous IC valve, left sided tics, non-bleeding hemorrhoid, 10 mm transverse polyp.     ESOPHAGOGASTRODUODENOSCOPY N/A 04/27/2017   Nandigam, Kavitha V, MD; [facility] ENDOSCOPY. for iron def anemia.  Monilial/candidial esophagitis. Small HH.     EYE SURGERY     scar tissue removal  1982   THYROID SURGERY  2010   THYROIDECTOMY  11-05-08    Family History  Problem Relation Age of Onset   Asthma Mother    Heart disease Mother    Emphysema Father    Heart disease Father    Irregular heart beat Brother    Asthma Grandchild     Social History   Tobacco Use   Smoking status: Never   Smokeless tobacco: Never  Vaping Use   Vaping Use: Never used  Substance Use Topics    Alcohol use: Yes    Alcohol/week: 1.0 standard drink of alcohol    Types: 1 Glasses of wine per week    Comment: occasional wine or beer   Drug use: No    Prior to Admission medications   Medication Sig Start Date End Date Taking? Authorizing Provider  albuterol (VENTOLIN HFA) 108 (90 Base) MCG/ACT inhaler INHALE 2 PUFFS INTO THE LUNGS EVERY 6 HOURS AS NEEDED FOR WHEEZING OR SHORTNESS OF BREATH 11/08/22  Yes Tabori, Katherine E, MD  ALPRAZolam (XANAX) 0.25 MG tablet Take 1 tablet (0.25 mg total) by mouth 2 (two) times daily as needed for anxiety. 07/12/22  Yes   Tabori, Katherine E, MD  amitriptyline (ELAVIL) 50 MG tablet Take one tablet at bedtime 11/08/22  Yes Tabori, Katherine E, MD  atorvastatin (LIPITOR) 20 MG tablet TAKE 1 TABLET(20 MG) BY MOUTH DAILY 10/10/22  Yes Tabori, Katherine E, MD  Budesonide (PULMICORT FLEXHALER) 90 MCG/ACT inhaler Inhale 2 puffs into the lungs 2 (two) times daily. Patient taking differently: Inhale 2 puffs into the lungs 2 (two) times daily as needed (sob/cough). 10/18/22  Yes Tabori, Katherine E, MD  calcium carbonate (TUMS - DOSED IN MG ELEMENTAL CALCIUM) 500 MG chewable tablet Chew 1-2 tablets by mouth 2 (two) times daily as needed for indigestion or heartburn.   Yes [provider]  digoxin (LANOXIN) 0.125 MG tablet Take 1 tablet (0.125 mg total) by mouth daily. 09/14/22  Yes Jordan, Peter M, MD  diphenhydramine-acetaminophen (TYLENOL PM) 25-500 MG TABS tablet Take 1 tablet by mouth at bedtime as needed (pain).   Yes [provider]  famotidine (PEPCID) 40 MG tablet Take 40 mg by mouth daily as needed for heartburn or indigestion.   Yes [provider]  fludrocortisone (FLORINEF) 0.1 MG tablet Take one tablet daily 10/10/22  Yes Tabori, Katherine E, MD  furosemide (LASIX) 20 MG tablet Take 1 tablet (20 mg total) by mouth daily as needed for edema. 12/06/22  Yes Jordan, Peter M, MD  hydrocortisone (CORTEF) 10 MG tablet Take 10 mg by mouth 2  (two) times daily. Patient reports taking BID 09/07/2022 09/30/20  Yes [provider]  hydroxypropyl methylcellulose / hypromellose (ISOPTO TEARS / GONIOVISC) 2.5 % ophthalmic solution Place 2 drops into both eyes daily as needed for dry eyes.   Yes [provider]  levothyroxine (SYNTHROID) 25 MCG tablet Take 25 mcg by mouth every morning. 08/16/22  Yes [provider]  omeprazole (PRILOSEC) 40 MG capsule Take 40 mg by mouth daily.   Yes [provider]  potassium chloride SA (KLOR-CON M) 20 MEQ tablet Take 2 tablets (40 mEq total) by mouth daily. 12/06/22  Yes Jordan, Peter M, MD  sertraline (ZOLOFT) 25 MG tablet Take 1 tablet (25 mg total) by mouth daily. 12/05/22  Yes Tabori, Katherine E, MD  magic mouthwash w/lidocaine SOLN Take 5 mLs by mouth 4 (four) times daily as needed for mouth pain. 12/16/22   Greene, Jeffrey R, MD  pantoprazole (PROTONIX) 40 MG tablet Take 1 tablet (40 mg total) by mouth daily. 12/16/22   Greene, Jeffrey R, MD    Current Facility-Administered Medications  Medication Dose Route Frequency Provider Last Rate Last Admin   acetaminophen (TYLENOL) tablet 650 mg  650 mg Oral Q6H PRN Goodrich, Daniel P, MD   650 mg at 12/17/22 0809   albuterol (VENTOLIN HFA) 108 (90 Base) MCG/ACT inhaler 2 puff  2 puff Inhalation Q2H PRN Nanavati, Ankit, MD       ALPRAZolam (XANAX) tablet 0.25 mg  0.25 mg Oral BID PRN Gardner, Jared M, DO       amitriptyline (ELAVIL) tablet 50 mg  50 mg Oral QHS Gardner, Jared M, DO       atorvastatin (LIPITOR) tablet 20 mg  20 mg Oral Daily Gardner, Jared M, DO   20 mg at 12/17/22 1009   benzonatate (TESSALON) capsule 200 mg  200 mg Oral TID PRN Gardner, Jared M, DO   200 mg at 12/17/22 0751   calcium carbonate (TUMS - dosed in mg elemental calcium) chewable tablet 200-400 mg of elemental calcium  1-2 tablet Oral BID PRN Gardner, Jared M,   DO       digoxin (LANOXIN) tablet 0.125 mg  0.125 mg Oral Daily Gardner, Jared M, DO    0.125 mg at 12/17/22 1019   diphenhydrAMINE (BENADRYL) capsule 25 mg  25 mg Oral QHS PRN Gardner, Jared M, DO       fludrocortisone (FLORINEF) tablet 0.1 mg  0.1 mg Oral Daily Gardner, Jared M, DO   0.1 mg at 12/17/22 1019   hydrocortisone (CORTEF) tablet 10 mg  10 mg Oral BID Gardner, Jared M, DO   10 mg at 12/17/22 1009   levothyroxine (SYNTHROID) tablet 25 mcg  25 mcg Oral Q0600 Gardner, Jared M, DO   25 mcg at 12/17/22 0507   ondansetron (ZOFRAN) tablet 4 mg  4 mg Oral Q6H PRN Gardner, Jared M, DO       Or   ondansetron (ZOFRAN) injection 4 mg  4 mg Intravenous Q6H PRN Gardner, Jared M, DO       pantoprazole (PROTONIX) 80 mg /NS 100 mL IVPB  80 mg Intravenous Q12H Gardner, Jared M, DO   Stopped at 12/17/22 1034   polyvinyl alcohol (LIQUIFILM TEARS) 1.4 % ophthalmic solution 2 drop  2 drop Both Eyes Daily PRN Gardner, Jared M, DO       sertraline (ZOLOFT) tablet 25 mg  25 mg Oral Daily Gardner, Jared M, DO   25 mg at 12/17/22 1013    Allergies as of 12/16/2022 - Review Complete 12/16/2022  Allergen Reaction Noted   Fenofibrate Other (See Comments) 06/08/2018   Erythromycin Nausea And Vomiting    Guaifenesin er Palpitations 05/22/2015   Sulfonamide derivatives Nausea And Vomiting      Review of Systems:     All other review of systems are negative.       Physical Exam:  Vital signs in last 24 hours: Temp:  [97.8 F (36.6 C)-99.4 F (37.4 C)] 97.8 F (36.6 C) (04/27 0826) Pulse Rate:  [75-93] 75 (04/27 0826) Resp:  [13-20] 17 (04/27 0826) BP: (104-144)/(50-74) 132/72 (04/27 0826) SpO2:  [94 %-100 %] 100 % (04/27 0826) Weight:  [46.7 kg] 46.7 kg (04/26 2056) Last BM Date : 12/17/22  General:  Thin petite elderly woman in no acute distress  eyes:  anicteric.  Pale conjunctiva ENT:   Mouth and posterior pharynx free of lesions.  Dentition fair at best missing some teeth Neck:   supple w/o thyromegaly or mass.  Lungs: Clear to auscultation bilaterally. Heart:   Distant  S1S2, no rubs, murmurs, gallops. Abdomen:  soft, non-tender, no hepatosplenomegaly, hernia, or mass and BS+.,  Somewhat tympanitic Rectal: Deferred until colonoscopy Lymph:  no cervical or supraclavicular adenopathy. Neuro:  A&O x 3.  Psych:  appropriate mood and  Affect.   Data Reviewed:   LAB RESULTS: Recent Labs    12/16/22 1451 12/17/22 0801  WBC 12.0* 11.4*  HGB 5.6* 7.9*  HCT 21.3* 29.1*  PLT 586* 432*   BMET Recent Labs    12/16/22 1451 12/17/22 0801  NA 138 133*  K 2.6* 3.7  CL 111 112*  CO2 16* 14*  GLUCOSE 127* 92  BUN 14 13  CREATININE 1.64* 1.51*  CALCIUM 9.7 9.3     STUDIES: DG Chest 2 View  Result Date: 12/16/2022 CLINICAL DATA:  Shortness of breath EXAM: CHEST - 2 VIEW COMPARISON:  CXR 10/18/22 FINDINGS: No pleural effusion. No pneumothorax. No focal airspace opacity. Normal cardiac and mediastinal contours. No radiographically apparent displaced rib fractures. Visualized upper abdomen is unremarkable. Vertebral   body heights are maintained. IMPRESSION: No focal airspace opacity. Electronically Signed   By: Hemant  Desai M.D.   On: 12/16/2022 13:51        Thanks   LOS: 0 days   @Flavio Lindroth E. Loetta Connelley, MD, FACG @  12/17/2022, 12:29 PM  

## 2022-12-17 NOTE — Progress Notes (Addendum)
Consent signed for EGD and colonoscopy scheduled for 12/18/22 at 0945. Placed in chart.

## 2022-12-17 NOTE — H&P (Signed)
History and Physical    Patient: Kaitlyn Adams:[id number] DOB: 1951/04/02 DOA: 12/16/2022 DOS: the patient was seen and examined on 12/17/2022 PCP: Sheliah Hatch, MD  Patient coming from: Home  Chief Complaint:  Chief Complaint  Patient presents with   Shortness of Breath   HPI: Kaitlyn Adams is a 72 y.o. female with medical history significant of HTN, addison dz.  Pt with h/o iron def anemia from GI blood loss back in 2018.  Big work up = only thing they found that might be causing it was maybe a small bowel erosion.  Also had colon polyps.  Not on chronic AC.  Pt in to ED with several month h/o fatigue, generalized weakness, DOE.  Denies recent illness, known blood loss, CP, hematochezia, melena.  Has chronic cough believed to be related to GERD.  Has severe GERD symptoms that have been worsening over past few months.  Went to PCP today, pulse ox was in the 80s and pt sent in to ED.  In ED, pt not hypoxic, but is very anemic on work up with HGB of 5.x down from 11 in Oct 2023.    Review of Systems: As mentioned in the history of present illness. All other systems reviewed and are negative. Past Medical History:  Diagnosis Date   Addison disease (HCC)    Anemia    Asthma    - normal spirometry 2009 FEV1  >90% predicted.  - methacoline challenge neg 11/09   Bronchitis    recurrent   Cataract    Chronic cough    - sinus CT 03-23-10 > neg.  - allergy profile 03-23-10 > nl, IgE 14.  - flutter valve rx 03-23-10   Fibromyalgia    GERD (gastroesophageal reflux disease)    exacerbating VCD   Glaucoma    History of blood transfusion    Hyperlipidemia    Hypertension    Hyperthyroidism    with hot nodule.  - total thyroidectomy 11-05-08 benign -> subsequent hypothyroidism.   Hypokalemia    Hypothyroidism    Hypothyroidism    a. following thyroidectomy.   Mild CAD    a. LHC 04/2015 - mild nonobstructive CAD with 30% dLAD, 40% D1, 25% pLCx, 30% mRCA, 30% RPDA,  normal EF >65% with normal LVEDP.   Orthostatic hypotension    Paroxysmal atrial fibrillation (HCC)    Renal insufficiency    Vasomotor rhinitis    exacerbates VCD   Vocal cord dysfunction    proven on FOB 9/09   Past Surgical History:  Procedure Laterality Date   ABDOMINAL HYSTERECTOMY  1980   ABDOMINAL HYSTERECTOMY     BASAL CELL CARCINOMA EXCISION  11/08   CARDIAC CATHETERIZATION N/A 05/15/2015   Procedure: Left Heart Cath and Coronary Angiography;  Surgeon: Tonny Bollman, MD;  Location: [facility] INVASIVE CV LAB;  Service: Cardiovascular;  Laterality: N/A;   COLONOSCOPY N/A 04/27/2017   Dr Lavon Paganini for scant rectal bleeding and iron def anemia: Melanosis coli, lipomatous IC valve, left sided tics, non-bleeding hemorrhoid, 10 mm transverse polyp.     ESOPHAGOGASTRODUODENOSCOPY N/A 04/27/2017   Napoleon Form, MD; [facility] ENDOSCOPY. for iron def anemia.  Monilial/candidial esophagitis. Small HH.     EYE SURGERY     scar tissue removal  1982   THYROID SURGERY  2010   THYROIDECTOMY  11-05-08   Social History:  reports that she has never smoked. She has never used smokeless tobacco. She reports current alcohol use of about 1.0  standard drink of alcohol per week. She reports that she does not use drugs.  Allergies  Allergen Reactions   Fenofibrate Other (See Comments)    GI side effects    Erythromycin Nausea And Vomiting    REACTION: nausea and vomiting   Guaifenesin Er Palpitations   Sulfonamide Derivatives Nausea And Vomiting    REACTION: nausea and vomiting    Family History  Problem Relation Age of Onset   Asthma Mother    Heart disease Mother    Emphysema Father    Heart disease Father    Irregular heart beat Brother    Asthma Grandchild     Prior to Admission medications   Medication Sig Start Date End Date Taking? Authorizing Provider  albuterol (VENTOLIN HFA) 108 (90 Base) MCG/ACT inhaler INHALE 2 PUFFS INTO THE LUNGS EVERY 6 HOURS AS NEEDED FOR WHEEZING OR SHORTNESS OF  BREATH 11/08/22  Yes Sheliah Hatch, MD  ALPRAZolam Prudy Feeler) 0.25 MG tablet Take 1 tablet (0.25 mg total) by mouth 2 (two) times daily as needed for anxiety. 07/12/22  Yes Sheliah Hatch, MD  amitriptyline (ELAVIL) 50 MG tablet Take one tablet at bedtime 11/08/22  Yes Sheliah Hatch, MD  atorvastatin (LIPITOR) 20 MG tablet TAKE 1 TABLET(20 MG) BY MOUTH DAILY 10/10/22  Yes Sheliah Hatch, MD  Budesonide (PULMICORT FLEXHALER) 90 MCG/ACT inhaler Inhale 2 puffs into the lungs 2 (two) times daily. Patient taking differently: Inhale 2 puffs into the lungs 2 (two) times daily as needed (sob/cough). 10/18/22  Yes Sheliah Hatch, MD  calcium carbonate (TUMS - DOSED IN MG ELEMENTAL CALCIUM) 500 MG chewable tablet Chew 1-2 tablets by mouth 2 (two) times daily as needed for indigestion or heartburn.   Yes [provider]  digoxin (LANOXIN) 0.125 MG tablet Take 1 tablet (0.125 mg total) by mouth daily. 09/14/22  Yes [place], Peter M, MD  diphenhydramine-acetaminophen (TYLENOL PM) 25-500 MG TABS tablet Take 1 tablet by mouth at bedtime as needed (pain).   Yes [provider]  famotidine (PEPCID) 40 MG tablet Take 40 mg by mouth daily as needed for heartburn or indigestion.   Yes [provider]  fludrocortisone (FLORINEF) 0.1 MG tablet Take one tablet daily 10/10/22  Yes Sheliah Hatch, MD  furosemide (LASIX) 20 MG tablet Take 1 tablet (20 mg total) by mouth daily as needed for edema. 12/06/22  Yes [place], Peter M, MD  hydrocortisone (CORTEF) 10 MG tablet Take 10 mg by mouth 2 (two) times daily. Patient reports taking BID 09/07/2022 09/30/20  Yes [provider]  hydroxypropyl methylcellulose / hypromellose (ISOPTO TEARS / GONIOVISC) 2.5 % ophthalmic solution Place 2 drops into both eyes daily as needed for dry eyes.   Yes [provider]  levothyroxine (SYNTHROID) 25 MCG tablet Take 25 mcg by mouth every morning. 08/16/22  Yes [provider]   omeprazole (PRILOSEC) 40 MG capsule Take 40 mg by mouth daily.   Yes [provider]  potassium chloride SA (KLOR-CON M) 20 MEQ tablet Take 2 tablets (40 mEq total) by mouth daily. 12/06/22  Yes [place], Peter M, MD  sertraline (ZOLOFT) 25 MG tablet Take 1 tablet (25 mg total) by mouth daily. 12/05/22  Yes Sheliah Hatch, MD  magic mouthwash w/lidocaine SOLN Take 5 mLs by mouth 4 (four) times daily as needed for mouth pain. 12/16/22   Shade Flood, MD  pantoprazole (PROTONIX) 40 MG tablet Take 1 tablet (40 mg total) by mouth daily. 12/16/22  Shade Flood, MD    Physical Exam: Vitals:   12/16/22 2056 12/17/22 0020 12/17/22 0048 12/17/22 0107  BP: 122/63 (!) 122/55 (!) 128/53 (!) 119/55  Pulse: 87 78 79 81  Resp:  17 13 16   Temp: 99.2 F (37.3 C) 98.6 F (37 C) 99 F (37.2 C) 98.6 F (37 C)  TempSrc: Oral Oral Oral Oral  SpO2: 100% 100% 100%   Weight: 46.7 kg     Height: 5\' 3"  (1.6 m)      Constitutional: NAD, calm, comfortable Respiratory: frequent dry cough Cardiovascular: Regular rate and rhythm, no murmurs / rubs / gallops. No extremity edema. 2+ pedal pulses. No carotid bruits.  Abdomen: no tenderness, no masses palpated. No hepatosplenomegaly. Bowel sounds positive.  Neurologic: CN 2-12 grossly intact. Sensation intact, DTR normal. Strength 5/5 in all 4.  Psychiatric: Normal judgment and insight. Alert and oriented x 3. Normal mood.   Data Reviewed:       Latest Ref Rng & Units 12/16/2022    2:51 PM 06/21/2022    9:08 AM 06/19/2022   12:08 PM  CBC  WBC 4.0 - 10.5 K/uL 12.0  14.9  19.1   Hemoglobin 12.0 - 15.0 g/dL 5.6  16.1  09.6   Hematocrit 36.0 - 46.0 % 21.3  36.9  40.4   Platelets 150 - 400 K/uL 586  527.0  620       Latest Ref Rng & Units 12/16/2022    2:51 PM 06/21/2022    9:08 AM 06/19/2022    5:43 PM  CMP  Glucose 70 - 99 mg/dL 045  96  97   BUN 8 - 23 mg/dL 14  22  31    Creatinine 0.44 - 1.00 mg/dL 4.09  8.11  9.14   Sodium 135 -  145 mmol/L 138  131  131   Potassium 3.5 - 5.1 mmol/L 2.6  3.1  3.5   Chloride 98 - 111 mmol/L 111  101  102   CO2 22 - 32 mmol/L 16  20  20    Calcium 8.9 - 10.3 mg/dL 9.7  9.8  8.4   Total Protein 6.0 - 8.3 g/dL  8.6    Total Bilirubin 0.2 - 1.2 mg/dL  0.4    Alkaline Phos 39 - 117 U/L  93    AST 0 - 37 U/L  25    ALT 0 - 35 U/L  12     Iron/TIBC/Ferritin/ %Sat    Component Value Date/Time   IRON <5 (L) 12/16/2022 1539   TIBC 396 12/16/2022 1539   FERRITIN 3 (L) 12/16/2022 1539   IRONPCTSAT NOT CALCULATED 12/16/2022 1539     Assessment and Plan: * Symptomatic anemia HGB 5.6, microcytic and hypochromic. Looks like she has iron deficiency anemia again.  ? If recurrent chronic GIB is once again causing as it did in 2018. 2u PRBC transfusion Start BID PO iron Clear liquid diet GI will see in consult tomorrow Replace K and Mg  History of colonic polyps Looks like she might be overdue for repeat colonoscopy.  Looks like [facility] wanted to do next scope ~2021.  She didn't have this done. I also mentioned this fact to GI in my consult request message.  GERD, SEVERE Pt with fairly severe GERD symptoms including heart burn, burning in throat, and severe chronic nocturnal cough / asthma.  Seems to be failing Omeprazole for past 6 months.  Looks like she also takes PRN tums at home. Putting on  protonix 80mg  IV BID for the moment to see if this has any effect on GERD symptoms Gastrin levels will be elevated in setting of chronic PPI use, so can't really test for ZES this way. Went ahead and mentioned the GERD symptoms to GI as well.  CKD (chronic kidney disease) stage 3, GFR 30-59 ml/min (HCC) Looks like pt may have CKD 3? creat 1.6 today but this seems unchanged since discharge in October.  Adrenal insufficiency (HCC) Cont cortef + Florinef  PAT (paroxysmal atrial tachycardia) On digoxin for PAT.  Single episode of PAF years ago but mostly PAT. Not on chronic AC due to: 1) single  episode PAF in past 2) h/o GIB with iron def anemia in past  Cont digoxin      Advance Care Planning:   Code Status: DNR Confirmed directly with patient  Consults: Message sent to [facility]  Family Communication: No family in room  Severity of Illness: The appropriate patient status for this patient is OBSERVATION. Observation status is judged to be reasonable and necessary in order to provide the required intensity of service to ensure the patient's safety. The patient's presenting symptoms, physical exam findings, and initial radiographic and laboratory data in the context of their medical condition is felt to place them at decreased risk for further clinical deterioration. Furthermore, it is anticipated that the patient will be medically stable for discharge from the hospital within 2 midnights of admission.   Author: Hillary Bow., DO 12/17/2022 2:51 AM  For on call review [email or web address].

## 2022-12-17 NOTE — Assessment & Plan Note (Signed)
Looks like pt may have CKD 3? creat 1.6 today but this seems unchanged since discharge in October.

## 2022-12-17 NOTE — Assessment & Plan Note (Signed)
On digoxin for PAT.  Single episode of PAF years ago but mostly PAT. Not on chronic AC due to: 1) single episode PAF in past 2) h/o GIB with iron def anemia in past  Cont digoxin

## 2022-12-17 NOTE — Consult Note (Signed)
Consultation  Referring Provider:     [facility] Primary Care Physician:  Sheliah Hatch, MD Primary Gastroenterologist:      Dr. Lavon Paganini Reason for Consultation:     Iron deficiency anemia     Impression / Plan:   Recurrent iron deficiency anemia in a person with history of colon polyps and negative workup otherwise (EGD, capsule endoscopy colonoscopy) 2018.  Some NSAID use (10 Aleve per month)  Dysphagia and heartburn symptomatology and a history of Candida esophagitis 2018  Chronic steroid therapy for Addison's disease which may be contributing to Candida esophagitis in the past question now  Chronic constipation on daily laxative (history of melanosis coli) ------------------------------------------------------------------------------------------------------------------  EGD and colonoscopy tomorrow if we can schedule.  Prep today.  Risks benefits and indications explained to the patient and her husband.  They understand and agreed to proceed.   Parenteral iron is appropriate in the patient if [facility] concurs.  Iva Boop, MD, [facility] Gastroenterology See Loretha Stapler on call - gastroenterology for best contact person 12/17/2022 12:41 PM     HPI:   Kaitlyn Adams is a 72 y.o. female with a history of iron deficiency anemia and colon polyps plus Candida esophagitis who presents with recurrent iron deficiency anemia.  Also has hypertension and Addison's disease plus a chronic cough attributed to GERD.  She was seen recently with fatigue and generalized weakness and dyspnea on exertion and also chronic cough.  She has been having worsening dyspnea and cough problems saw primary care some and then went to primary care yesterday and was hypoxic and referred to the emergency department.  Hemoglobin was 5.6 MCV 68.  5 months ago she had a hemoglobin of 11.7 and MCV of 74 she had a hemoglobin of 12 6 months ago and an MCV of 75 and 2 years ago hemoglobin 15 MCV  83.  She was admitted to the hospital with iron deficiency anemia in 2018 and underwent GI evaluation.  EGD demonstrated Candida esophagitis.  Colonoscopy demonstrated a 10 mm adenoma and an abnormal looking ileocecal valve that was biopsied and showed adenoma.  A capsule endoscopy showed a single erosion in the small bowel.  She was treated with iron she took iron therapy orally for while after that but has not had any since.  She was referred to [facility] for removal of adenoma at ileocecal valve.  She did have 1 colonoscopy there on 06/01/2017.  I am unable to see the complete report but the ileocecal valve polyp was an 18 mm polyp and removed and was an adenoma.  There was also a 6 mm polyp in the ascending colon that was not removed given there were plans to follow-up with a colonoscopy in 3 to 6 months.  This was never done.  The patient denies any rectal bleeding or melena.  She does have mild dysphagia and significant heartburn symptomatology and a sore mouth.  She denies appetite change or weight loss or abdominal pain.  She continues to take a laxative pill daily to treat constipation which is chronic.  She did have melanosis coli at her previous colonoscopies.  She does report using 10 Aleve a month approximately.  She feels somewhat stronger after 2 units of packed red cells since admission.  Hemoglobin is now up to 7.9.   Lab Results  Component Value Date   WBC 11.4 (H) 12/17/2022   HGB 7.9 (L) 12/17/2022   HCT 29.1 (L) 12/17/2022   MCV 72.0 (  L) 12/17/2022   PLT 432 (H) 12/17/2022    Lab Results  Component Value Date   FERRITIN 3 (L) 12/16/2022   Lab Results  Component Value Date   VITAMINB12 214 12/16/2022   Lab Results  Component Value Date   FOLATE 19.4 12/16/2022    Past Medical History:  Diagnosis Date   Addison disease (HCC)    Anemia    Asthma    - normal spirometry 2009 FEV1  >90% predicted.  - methacoline challenge neg 11/09   Bronchitis    recurrent   Cataract     Chronic cough    - sinus CT 03-23-10 > neg.  - allergy profile 03-23-10 > nl, IgE 14.  - flutter valve rx 03-23-10   Fibromyalgia    GERD (gastroesophageal reflux disease)    exacerbating VCD   Glaucoma    History of blood transfusion    Hyperlipidemia    Hypertension    Hyperthyroidism    with hot nodule.  - total thyroidectomy 11-05-08 benign -> subsequent hypothyroidism.   Hypokalemia    Hypothyroidism    Hypothyroidism    a. following thyroidectomy.   Mild CAD    a. LHC 04/2015 - mild nonobstructive CAD with 30% dLAD, 40% D1, 25% pLCx, 30% mRCA, 30% RPDA, normal EF >65% with normal LVEDP.   Orthostatic hypotension    Paroxysmal atrial fibrillation (HCC)    Renal insufficiency    Vasomotor rhinitis    exacerbates VCD   Vocal cord dysfunction    proven on FOB 9/09    Past Surgical History:  Procedure Laterality Date   ABDOMINAL HYSTERECTOMY  1980   ABDOMINAL HYSTERECTOMY     BASAL CELL CARCINOMA EXCISION  11/08   CARDIAC CATHETERIZATION N/A 05/15/2015   Procedure: Left Heart Cath and Coronary Angiography;  Surgeon: Tonny Bollman, MD;  Location: [facility] INVASIVE CV LAB;  Service: Cardiovascular;  Laterality: N/A;   COLONOSCOPY N/A 04/27/2017   Dr Lavon Paganini for scant rectal bleeding and iron def anemia: Melanosis coli, lipomatous IC valve, left sided tics, non-bleeding hemorrhoid, 10 mm transverse polyp.     ESOPHAGOGASTRODUODENOSCOPY N/A 04/27/2017   Napoleon Form, MD; [facility] ENDOSCOPY. for iron def anemia.  Monilial/candidial esophagitis. Small HH.     EYE SURGERY     scar tissue removal  1982   THYROID SURGERY  2010   THYROIDECTOMY  11-05-08    Family History  Problem Relation Age of Onset   Asthma Mother    Heart disease Mother    Emphysema Father    Heart disease Father    Irregular heart beat Brother    Asthma Grandchild     Social History   Tobacco Use   Smoking status: Never   Smokeless tobacco: Never  Vaping Use   Vaping Use: Never used  Substance Use Topics    Alcohol use: Yes    Alcohol/week: 1.0 standard drink of alcohol    Types: 1 Glasses of wine per week    Comment: occasional wine or beer   Drug use: No    Prior to Admission medications   Medication Sig Start Date End Date Taking? Authorizing Provider  albuterol (VENTOLIN HFA) 108 (90 Base) MCG/ACT inhaler INHALE 2 PUFFS INTO THE LUNGS EVERY 6 HOURS AS NEEDED FOR WHEEZING OR SHORTNESS OF BREATH 11/08/22  Yes Sheliah Hatch, MD  ALPRAZolam Prudy Feeler) 0.25 MG tablet Take 1 tablet (0.25 mg total) by mouth 2 (two) times daily as needed for anxiety. 07/12/22  Yes  Sheliah Hatch, MD  amitriptyline (ELAVIL) 50 MG tablet Take one tablet at bedtime 11/08/22  Yes Sheliah Hatch, MD  atorvastatin (LIPITOR) 20 MG tablet TAKE 1 TABLET(20 MG) BY MOUTH DAILY 10/10/22  Yes Sheliah Hatch, MD  Budesonide (PULMICORT FLEXHALER) 90 MCG/ACT inhaler Inhale 2 puffs into the lungs 2 (two) times daily. Patient taking differently: Inhale 2 puffs into the lungs 2 (two) times daily as needed (sob/cough). 10/18/22  Yes Sheliah Hatch, MD  calcium carbonate (TUMS - DOSED IN MG ELEMENTAL CALCIUM) 500 MG chewable tablet Chew 1-2 tablets by mouth 2 (two) times daily as needed for indigestion or heartburn.   Yes [provider]  digoxin (LANOXIN) 0.125 MG tablet Take 1 tablet (0.125 mg total) by mouth daily. 09/14/22  Yes [place], Peter M, MD  diphenhydramine-acetaminophen (TYLENOL PM) 25-500 MG TABS tablet Take 1 tablet by mouth at bedtime as needed (pain).   Yes [provider]  famotidine (PEPCID) 40 MG tablet Take 40 mg by mouth daily as needed for heartburn or indigestion.   Yes [provider]  fludrocortisone (FLORINEF) 0.1 MG tablet Take one tablet daily 10/10/22  Yes Sheliah Hatch, MD  furosemide (LASIX) 20 MG tablet Take 1 tablet (20 mg total) by mouth daily as needed for edema. 12/06/22  Yes [place], Peter M, MD  hydrocortisone (CORTEF) 10 MG tablet Take 10 mg by mouth 2  (two) times daily. Patient reports taking BID 09/07/2022 09/30/20  Yes [provider]  hydroxypropyl methylcellulose / hypromellose (ISOPTO TEARS / GONIOVISC) 2.5 % ophthalmic solution Place 2 drops into both eyes daily as needed for dry eyes.   Yes [provider]  levothyroxine (SYNTHROID) 25 MCG tablet Take 25 mcg by mouth every morning. 08/16/22  Yes [provider]  omeprazole (PRILOSEC) 40 MG capsule Take 40 mg by mouth daily.   Yes [provider]  potassium chloride SA (KLOR-CON M) 20 MEQ tablet Take 2 tablets (40 mEq total) by mouth daily. 12/06/22  Yes [place], Peter M, MD  sertraline (ZOLOFT) 25 MG tablet Take 1 tablet (25 mg total) by mouth daily. 12/05/22  Yes Sheliah Hatch, MD  magic mouthwash w/lidocaine SOLN Take 5 mLs by mouth 4 (four) times daily as needed for mouth pain. 12/16/22   Shade Flood, MD  pantoprazole (PROTONIX) 40 MG tablet Take 1 tablet (40 mg total) by mouth daily. 12/16/22   Shade Flood, MD    Current Facility-Administered Medications  Medication Dose Route Frequency Provider Last Rate Last Admin   acetaminophen (TYLENOL) tablet 650 mg  650 mg Oral Q6H PRN Standley Brooking, MD   650 mg at 12/17/22 0809   albuterol (VENTOLIN HFA) 108 (90 Base) MCG/ACT inhaler 2 puff  2 puff Inhalation Q2H PRN Derwood Kaplan, MD       ALPRAZolam Prudy Feeler) tablet 0.25 mg  0.25 mg Oral BID PRN Hillary Bow, DO       amitriptyline (ELAVIL) tablet 50 mg  50 mg Oral QHS Lyda Perone M, DO       atorvastatin (LIPITOR) tablet 20 mg  20 mg Oral Daily Lyda Perone M, DO   20 mg at 12/17/22 1009   benzonatate (TESSALON) capsule 200 mg  200 mg Oral TID PRN Hillary Bow, DO   200 mg at 12/17/22 0751   calcium carbonate (TUMS - dosed in mg elemental calcium) chewable tablet 200-400 mg of elemental calcium  1-2 tablet Oral BID PRN Hillary Bow,  DO       digoxin (LANOXIN) tablet 0.125 mg  0.125 mg Oral Daily Lyda Perone M, DO    0.125 mg at 12/17/22 1019   diphenhydrAMINE (BENADRYL) capsule 25 mg  25 mg Oral QHS PRN Hillary Bow, DO       fludrocortisone (FLORINEF) tablet 0.1 mg  0.1 mg Oral Daily Lyda Perone M, DO   0.1 mg at 12/17/22 1019   hydrocortisone (CORTEF) tablet 10 mg  10 mg Oral BID Hillary Bow, DO   10 mg at 12/17/22 1009   levothyroxine (SYNTHROID) tablet 25 mcg  25 mcg Oral Q0600 Hillary Bow, DO   25 mcg at 12/17/22 0507   ondansetron (ZOFRAN) tablet 4 mg  4 mg Oral Q6H PRN Hillary Bow, DO       Or   ondansetron [facility]) injection 4 mg  4 mg Intravenous Q6H PRN Hillary Bow, DO       pantoprazole (PROTONIX) 80 mg /NS 100 mL IVPB  80 mg Intravenous Q12H Lyda Perone M, DO   Stopped at 12/17/22 1034   polyvinyl alcohol (LIQUIFILM TEARS) 1.4 % ophthalmic solution 2 drop  2 drop Both Eyes Daily PRN Hillary Bow, DO       sertraline (ZOLOFT) tablet 25 mg  25 mg Oral Daily Lyda Perone M, DO   25 mg at 12/17/22 1013    Allergies as of 12/16/2022 - Review Complete 12/16/2022  Allergen Reaction Noted   Fenofibrate Other (See Comments) 06/08/2018   Erythromycin Nausea And Vomiting    Guaifenesin er Palpitations 05/22/2015   Sulfonamide derivatives Nausea And Vomiting      Review of Systems:     All other review of systems are negative.       Physical Exam:  Vital signs in last 24 hours: Temp:  [97.8 F (36.6 C)-99.4 F (37.4 C)] 97.8 F (36.6 C) (04/27 0826) Pulse Rate:  [75-93] 75 (04/27 0826) Resp:  [13-20] 17 (04/27 0826) BP: (104-144)/(50-74) 132/72 (04/27 0826) SpO2:  [94 %-100 %] 100 % (04/27 0826) Weight:  [46.7 kg] 46.7 kg (04/26 2056) Last BM Date : 12/17/22  General:  Thin petite elderly woman in no acute distress  eyes:  anicteric.  Pale conjunctiva ENT:   Mouth and posterior pharynx free of lesions.  Dentition fair at best missing some teeth Neck:   supple w/o thyromegaly or mass.  Lungs: Clear to auscultation bilaterally. Heart:   Distant  S1S2, no rubs, murmurs, gallops. Abdomen:  soft, non-tender, no hepatosplenomegaly, hernia, or mass and BS+.,  Somewhat tympanitic Rectal: Deferred until colonoscopy Lymph:  no cervical or supraclavicular adenopathy. Neuro:  A&O x 3.  Psych:  appropriate mood and  Affect.   Data Reviewed:   LAB RESULTS: Recent Labs    12/16/22 1451 12/17/22 0801  WBC 12.0* 11.4*  HGB 5.6* 7.9*  HCT 21.3* 29.1*  PLT 586* 432*   BMET Recent Labs    12/16/22 1451 12/17/22 0801  NA 138 133*  K 2.6* 3.7  CL 111 112*  CO2 16* 14*  GLUCOSE 127* 92  BUN 14 13  CREATININE 1.64* 1.51*  CALCIUM 9.7 9.3     STUDIES: DG Chest 2 View  Result Date: 12/16/2022 CLINICAL DATA:  Shortness of breath EXAM: CHEST - 2 VIEW COMPARISON:  CXR 10/18/22 FINDINGS: No pleural effusion. No pneumothorax. No focal airspace opacity. Normal cardiac and mediastinal contours. No radiographically apparent displaced rib fractures. Visualized upper abdomen is unremarkable. Vertebral  body heights are maintained. IMPRESSION: No focal airspace opacity. Electronically Signed   By: Lorenza Cambridge M.D.   On: 12/16/2022 13:51        Thanks   LOS: 0 days   @Cambri Plourde  Sena Slate, MD, [facility] @  12/17/2022, 12:29 PM

## 2022-12-17 NOTE — Assessment & Plan Note (Signed)
Looks like she might be overdue for repeat colonoscopy.  Looks like [facility] wanted to do next scope ~2021.  She didn't have this done. I also mentioned this fact to GI in my consult request message.

## 2022-12-17 NOTE — Assessment & Plan Note (Signed)
Cont cortef + Florinef

## 2022-12-17 NOTE — Assessment & Plan Note (Signed)
Pt with fairly severe GERD symptoms including heart burn, burning in throat, and severe chronic nocturnal cough / asthma.  Seems to be failing Omeprazole for past 6 months.  Looks like she also takes PRN tums at home. Putting on protonix 80mg  IV BID for the moment to see if this has any effect on GERD symptoms Gastrin levels will be elevated in setting of chronic PPI use, so can't really test for ZES this way. Went ahead and mentioned the GERD symptoms to GI as well.

## 2022-12-17 NOTE — Progress Notes (Signed)
  Progress Note   Patient: Kaitlyn Adams ZOX:[id number] DOB: 12-24-1950 DOA: 12/16/2022     0 DOS: the patient was seen and examined on 12/17/2022   Brief hospital course: 72 year old woman PMH Addison's disease, iron deficiency anemia 2018, workup at that time revealed possible small bowel erosion.  Colon polyps.  Presented with fatigue, generalized weakness.  Found to have marked anemia with hemoglobin of 5.6.  Admitted for further evaluation.  Consultants GI  Procedures  Assessment and Plan: * Symptomatic anemia Severe iron deficiency anemia Concern for GI bleed Hemoglobin 5.6 on admission, improved appropriately with 2 units PRBC transfusion. Anemia panel consistent with severe iron deficiency.  IV iron today. CBC in AM.  GI plans EGD and colonoscopy tomorrow.  GERD Symptoms several days a week. Continue PPI.  Further recommendations per GI.  CKD stage IIIb suspected. Creatinine normal March 2022 but was elevated in October 2023.  Difficult to say baseline at this time. BMP in AM.  Adrenal insufficiency (HCC) Hemodynamic stable.  Cont cortef + Florinef  PAT (paroxysmal atrial tachycardia) On digoxin for PAT.  Single episode of PAF years ago but mostly PAT. Not on chronic AC due to single episode PAF in past and h/o GIB with iron def anemia in past Cont digoxin  Elevated troponin.  Clinically insignificant. Elevated D-dimer.  Clinically insignificant.     Subjective:  Feels fine No pain No bleeding Not aware of any bleeding Has had worsening reflux last few months  Physical Exam: Vitals:   12/17/22 0442 12/17/22 0507 12/17/22 0826 12/17/22 1359  BP: 131/69 129/62 132/72 139/71  Pulse: 79 76 75 71  Resp: 16 16 17 17   Temp: 98.8 F (37.1 C) 98.9 F (37.2 C) 97.8 F (36.6 C) 97.7 F (36.5 C)  TempSrc: Oral  Oral Oral  SpO2: 100%  100% 100%  Weight:      Height:       Physical Exam Vitals reviewed.  Constitutional:      General: She is not in acute  distress.    Appearance: She is not ill-appearing or toxic-appearing.  Cardiovascular:     Rate and Rhythm: Normal rate and regular rhythm.     Heart sounds: No murmur heard. Pulmonary:     Effort: Pulmonary effort is normal. No respiratory distress.     Breath sounds: No wheezing, rhonchi or rales.  Neurological:     Mental Status: She is alert.  Psychiatric:        Mood and Affect: Mood normal.        Behavior: Behavior normal.     Data Reviewed: AFVSS  Labs today K+ 3.7 Creatinine 1.51 Phos 2.4 Mg2+ up to 2.0 Hgb up to 7.9  Admissions labs Troponins 20, 18, 21 Ferritin 3 Hgb 5.6 K+ 2.6 yesterday Creatinine yesterday 1.64, better than 2023, but worse than previous years, baseline uncertain Ddimer 0.68 CXR ok EKG SR RBBB old  Family Communication: husband at bedside  Disposition: Status is: Observation   Planned Discharge Destination: Home    Time spent: 35 minutes  Author: Brendia Sacks, MD 12/17/2022 2:39 PM  For on call review [email or web address].

## 2022-12-17 NOTE — Plan of Care (Signed)

## 2022-12-17 NOTE — Assessment & Plan Note (Addendum)
HGB 5.6, microcytic and hypochromic. Looks like she has iron deficiency anemia again.  ? If recurrent chronic GIB is once again causing as it did in 2018. 2u PRBC transfusion Start BID PO iron Clear liquid diet GI will see in consult tomorrow Replace K and Mg

## 2022-12-17 NOTE — Hospital Course (Addendum)
72 year old woman PMH Addison's disease, iron deficiency anemia 2018, workup at that time revealed possible small bowel erosion.  Colon polyps.  Presented with fatigue, generalized weakness.  Found to have marked anemia with hemoglobin of 5.6.  Admitted for further evaluation.  Consultants GI  Procedures EGD Colonoscopy

## 2022-12-18 ENCOUNTER — Inpatient Hospital Stay (HOSPITAL_COMMUNITY): Payer: Medicare Other | Admitting: Certified Registered Nurse Anesthetist

## 2022-12-18 ENCOUNTER — Encounter (HOSPITAL_COMMUNITY)
Admission: EM | Disposition: A | Discharge status: Home / Self Care | Payer: Self-pay | Source: Home / Self Care | Attending: Family Medicine

## 2022-12-18 ENCOUNTER — Encounter (HOSPITAL_COMMUNITY): Payer: Self-pay | Admitting: Family Medicine

## 2022-12-18 DIAGNOSIS — F418 Other specified anxiety disorders: Secondary | ICD-10-CM

## 2022-12-18 DIAGNOSIS — D122 Benign neoplasm of ascending colon: Secondary | ICD-10-CM | POA: Diagnosis not present

## 2022-12-18 DIAGNOSIS — B3781 Candidal esophagitis: Secondary | ICD-10-CM | POA: Diagnosis not present

## 2022-12-18 DIAGNOSIS — K624 Stenosis of anus and rectum: Secondary | ICD-10-CM | POA: Diagnosis not present

## 2022-12-18 DIAGNOSIS — N1832 Chronic kidney disease, stage 3b: Secondary | ICD-10-CM | POA: Diagnosis not present

## 2022-12-18 DIAGNOSIS — K6389 Other specified diseases of intestine: Secondary | ICD-10-CM

## 2022-12-18 DIAGNOSIS — K644 Residual hemorrhoidal skin tags: Secondary | ICD-10-CM

## 2022-12-18 DIAGNOSIS — D509 Iron deficiency anemia, unspecified: Secondary | ICD-10-CM | POA: Diagnosis not present

## 2022-12-18 DIAGNOSIS — D123 Benign neoplasm of transverse colon: Secondary | ICD-10-CM | POA: Diagnosis not present

## 2022-12-18 DIAGNOSIS — D126 Benign neoplasm of colon, unspecified: Secondary | ICD-10-CM

## 2022-12-18 DIAGNOSIS — Z9889 Other specified postprocedural states: Secondary | ICD-10-CM

## 2022-12-18 DIAGNOSIS — D649 Anemia, unspecified: Secondary | ICD-10-CM | POA: Diagnosis not present

## 2022-12-18 HISTORY — PX: POLYPECTOMY: SHX5525

## 2022-12-18 HISTORY — PX: BIOPSY: SHX5522

## 2022-12-18 HISTORY — PX: COLONOSCOPY WITH PROPOFOL: SHX5780

## 2022-12-18 HISTORY — PX: ESOPHAGOGASTRODUODENOSCOPY (EGD) WITH PROPOFOL: SHX5813

## 2022-12-18 LAB — TYPE AND SCREEN: Unit division: 0

## 2022-12-18 LAB — MAGNESIUM: Magnesium: 2 mg/dL (ref 1.7–2.4)

## 2022-12-18 LAB — BPAM RBC
Blood Product Expiration Date: 202406012359
Blood Product Expiration Date: 202406012359
ISSUE DATE / TIME: 202404270445
Unit Type and Rh: 5100

## 2022-12-18 LAB — BASIC METABOLIC PANEL
Anion gap: 10 (ref 5–15)
BUN: 10 mg/dL (ref 8–23)
CO2: 12 mmol/L — ABNORMAL LOW (ref 22–32)
Calcium: 8.2 mg/dL — ABNORMAL LOW (ref 8.9–10.3)
Chloride: 118 mmol/L — ABNORMAL HIGH (ref 98–111)
Creatinine, Ser: 1.48 mg/dL — ABNORMAL HIGH (ref 0.44–1.00)
GFR, Estimated: 37 mL/min — ABNORMAL LOW (ref >60)
Glucose, Bld: 76 mg/dL (ref 70–99)
Potassium: 2.3 mmol/L — CL (ref 3.5–5.1)
Sodium: 140 mmol/L (ref 135–145)

## 2022-12-18 LAB — CBC
HCT: 31.8 % — ABNORMAL LOW (ref 36.0–46.0)
Hemoglobin: 8.5 g/dL — ABNORMAL LOW (ref 12.0–15.0)
MCH: 19.6 pg — ABNORMAL LOW (ref 26.0–34.0)
MCHC: 26.7 g/dL — ABNORMAL LOW (ref 30.0–36.0)
MCV: 73.4 fL — ABNORMAL LOW (ref 80.0–100.0)
Platelets: 448 10*3/uL — ABNORMAL HIGH (ref 150–400)
RBC: 4.33 MIL/uL (ref 3.87–5.11)
RDW: 22 % — ABNORMAL HIGH (ref 11.5–15.5)
WBC: 9.5 10*3/uL (ref 4.0–10.5)
nRBC: 0.2 % (ref 0.0–0.2)

## 2022-12-18 LAB — PHOSPHORUS: Phosphorus: 2.9 mg/dL (ref 2.5–4.6)

## 2022-12-18 SURGERY — ESOPHAGOGASTRODUODENOSCOPY (EGD) WITH PROPOFOL
Anesthesia: Monitor Anesthesia Care

## 2022-12-18 MED ORDER — LIP MEDEX EX OINT
TOPICAL_OINTMENT | CUTANEOUS | Status: DC | PRN
  Filled 2022-12-18: qty 7

## 2022-12-18 MED ORDER — DEXAMETHASONE SODIUM PHOSPHATE 10 MG/ML IJ SOLN
INTRAMUSCULAR | Status: DC | PRN
  Administered 2022-12-18: 10 mg via INTRAVENOUS

## 2022-12-18 MED ORDER — PHENYLEPHRINE HCL (PRESSORS) 10 MG/ML IV SOLN
INTRAVENOUS | Status: AC
  Filled 2022-12-18: qty 2

## 2022-12-18 MED ORDER — FLUCONAZOLE 100 MG PO TABS: 100.0000 mg | ORAL_TABLET | Freq: Every day | ORAL | 0 refills | Status: DC

## 2022-12-18 MED ORDER — POLYETHYLENE GLYCOL 3350 17 G PO PACK: 17.0000 g | PACK | Freq: Every day | ORAL | 0 refills | Status: DC | PRN

## 2022-12-18 MED ORDER — PROPOFOL 10 MG/ML IV BOLUS
INTRAVENOUS | Status: AC
  Filled 2022-12-18: qty 20

## 2022-12-18 MED ORDER — BENZONATATE 200 MG PO CAPS: 200.0000 mg | ORAL_CAPSULE | Freq: Three times a day (TID) | ORAL | 0 refills | Status: DC | PRN

## 2022-12-18 MED ORDER — POTASSIUM CHLORIDE 10 MEQ/100ML IV SOLN
10.0000 meq | INTRAVENOUS | Status: DC
  Administered 2022-12-18: 10 meq via INTRAVENOUS
  Filled 2022-12-18 (×5): qty 100

## 2022-12-18 MED ORDER — POTASSIUM CHLORIDE CRYS ER 20 MEQ PO TBCR
40.0000 meq | EXTENDED_RELEASE_TABLET | Freq: Once | ORAL | Status: AC
  Administered 2022-12-18: 40 meq via ORAL
  Filled 2022-12-18: qty 2

## 2022-12-18 MED ORDER — ONDANSETRON HCL 4 MG/2ML IJ SOLN
INTRAMUSCULAR | Status: DC | PRN
  Administered 2022-12-18: 4 mg via INTRAVENOUS

## 2022-12-18 MED ORDER — POTASSIUM CHLORIDE CRYS ER 20 MEQ PO TBCR
40.0000 meq | EXTENDED_RELEASE_TABLET | Freq: Once | ORAL | Status: AC
  Administered 2022-12-18: 40 meq via ORAL
  Filled 2022-12-18 (×2): qty 2

## 2022-12-18 MED ORDER — FLUCONAZOLE 100 MG PO TABS
200.0000 mg | ORAL_TABLET | Freq: Once | ORAL | Status: AC
  Administered 2022-12-18: 200 mg via ORAL
  Filled 2022-12-18: qty 2

## 2022-12-18 MED ORDER — LIDOCAINE 2% (20 MG/ML) 5 ML SYRINGE
INTRAMUSCULAR | Status: DC | PRN
  Administered 2022-12-18: 50 mg via INTRAVENOUS

## 2022-12-18 MED ORDER — LACTATED RINGERS IV SOLN: INTRAVENOUS | Status: DC

## 2022-12-18 MED ORDER — PROPOFOL 500 MG/50ML IV EMUL
INTRAVENOUS | Status: DC | PRN
  Administered 2022-12-18: 100 ug/kg/min via INTRAVENOUS

## 2022-12-18 MED ORDER — PROPOFOL 10 MG/ML IV BOLUS
INTRAVENOUS | Status: DC | PRN
  Administered 2022-12-18 (×2): 20 mg via INTRAVENOUS
  Administered 2022-12-18: 10 mg via INTRAVENOUS

## 2022-12-18 MED ORDER — FERROUS SULFATE 325 (65 FE) MG PO TBEC: 325.0000 mg | DELAYED_RELEASE_TABLET | Freq: Every day | ORAL | Status: DC

## 2022-12-18 MED ORDER — FLUCONAZOLE 100 MG PO TABS: 100.0000 mg | ORAL_TABLET | Freq: Every day | ORAL | Status: DC

## 2022-12-18 MED ORDER — POTASSIUM CHLORIDE 10 MEQ/100ML IV SOLN
10.0000 meq | INTRAVENOUS | Status: AC
  Administered 2022-12-18 (×3): 10 meq via INTRAVENOUS

## 2022-12-18 SURGICAL SUPPLY — 25 items

## 2022-12-18 NOTE — Anesthesia Postprocedure Evaluation (Signed)
Anesthesia Post Note  Patient: Kaitlyn Adams  Procedure(s) Performed: ESOPHAGOGASTRODUODENOSCOPY (EGD) WITH PROPOFOL COLONOSCOPY WITH PROPOFOL BIOPSY POLYPECTOMY     Patient location during evaluation: PACU Anesthesia Type: MAC Level of consciousness: awake and alert Pain management: pain level controlled Vital Signs Assessment: post-procedure vital signs reviewed and stable Respiratory status: spontaneous breathing, nonlabored ventilation, respiratory function stable and patient connected to nasal cannula oxygen Cardiovascular status: stable and blood pressure returned to baseline Postop Assessment: no apparent nausea or vomiting Anesthetic complications: no   No notable events documented.  Last Vitals:  Vitals:   12/18/22 1030 12/18/22 1043  BP:  (!) 92/58  Pulse: 99   Resp: 15   Temp:    SpO2: 97%     Last Pain:  Vitals:   12/18/22 1043  TempSrc:   PainSc: 0-No pain                 Jonnae Fonseca

## 2022-12-18 NOTE — Op Note (Signed)
[facility] Patient Name: Kaitlyn Adams Procedure Date: 12/18/2022 MRN: [id number] Attending MD: Iva Boop , MD, [id number] Date of Birth: Jun 04, 1951 CSN: [id number] Age: 72 Admit Type: Inpatient Procedure:                Upper GI endoscopy Indications:              Iron deficiency anemia, Dysphagia Providers:                Iva Boop, MD, Zoe Lan, RN, Kandice Robinsons, Technician Referring MD:              Medicines:                Monitored Anesthesia Care Complications:            No immediate complications. Estimated Blood Loss:     Estimated blood loss was minimal. Procedure:                Pre-Anesthesia Assessment:                           - Prior to the procedure, a History and Physical                            was performed, and patient medications and                            allergies were reviewed. The patient's tolerance of                            previous anesthesia was also reviewed. The risks                            and benefits of the procedure and the sedation                            options and risks were discussed with the patient.                            All questions were answered, and informed consent                            was obtained. Prior Anticoagulants: The patient has                            taken no anticoagulant or antiplatelet agents. ASA                            Grade Assessment: IV - A patient with severe                            systemic disease that is a constant threat to life.  After reviewing the risks and benefits, the patient                            was deemed in satisfactory condition to undergo the                            procedure.                           After obtaining informed consent, the endoscope was                            passed under direct vision. Throughout the                            procedure, the  patient's blood pressure, pulse, and                            oxygen saturations were monitored continuously. The                            [id number]-H190 ([id number] Olympus endoscope was introduced                            through the mouth, and advanced to the second part                            of duodenum. The upper GI endoscopy was                            accomplished without difficulty. The patient                            tolerated the procedure well. Scope In: Scope Out: Findings:      Diffuse, white and yellow plaques were found in the entire esophagus.      The exam was otherwise without abnormality.      The cardia and gastric fundus were normal on retroflexion.      Biopsies for histology were taken with a cold forceps in the entire       duodenum for evaluation of celiac disease. Verification of patient       identification for the specimen was done. Estimated blood loss was       minimal. Impression:               - Esophageal plaques were found, secondary to                            candidiasis.                           - The examination was otherwise normal.                           - Biopsies were taken with a cold forceps for  evaluation of celiac disease. Moderate Sedation:      Not Applicable - Patient had care per Anesthesia. Recommendation:           - See the other procedure note for documentation of                            additional recommendations.                           - treat Candida w/ fluconazole 200 mg x 1 then 100                            mg daily x 20 more days 921 days total)                           Consider chronic suppressive tx since takes                            long-term steroids (? Nystatin swish/swllow?)                           I will follow-up pathology on duodenal biopsies to                            r/o Celiac Procedure Code(s):        --- Professional ---                            [phone], Esophagogastroduodenoscopy, flexible,                            transoral; with biopsy, single or multiple Diagnosis Code(s):        --- Professional ---                           B37.81, Candidal esophagitis                           D50.9, Iron deficiency anemia, unspecified                           R13.10, Dysphagia, unspecified CPT copyright 2022 American Medical Association. All rights reserved. The codes documented in this report are preliminary and upon coder review may  be revised to meet current compliance requirements. Iva Boop, MD 12/18/2022 10:29:09 AM This report has been signed electronically. Number of Addenda: 0

## 2022-12-18 NOTE — Progress Notes (Signed)
Patient discharged to home with family, discharge instructions reviewed with patient who verbalized understanding. 

## 2022-12-18 NOTE — TOC CM/SW Note (Signed)
  Transition of Care [facility]) Screening Note   Patient Details  Name: Kaitlyn Adams Date of Birth: [birth year]   [facility] CM/SW Contact:    Amada Jupiter, LCSW Phone Number: 12/18/2022, 11:11 AM    Transition of Care Department [facility]) has reviewed patient and no TOC needs have been identified at this time. We will continue to monitor patient advancement through interdisciplinary progression rounds. If new patient transition needs arise, please place a TOC consult.

## 2022-12-18 NOTE — Interval H&P Note (Signed)
History and Physical Interval Note:  12/18/2022 9:36 AM  Kaitlyn Adams  has presented today for surgery, with the diagnosis of IRON DEFICIENCY ANEMIA, DYSPHAGIA.  The various methods of treatment have been discussed with the patient and family. After consideration of risks, benefits and other options for treatment, the patient has consented to  Procedure(s): ESOPHAGOGASTRODUODENOSCOPY (EGD) WITH PROPOFOL (N/A) COLONOSCOPY WITH PROPOFOL (N/A) as a surgical intervention.  The patient's history has been reviewed, patient examined, no change in status, stable for surgery.  I have reviewed the patient's chart and labs.  Questions were answered to the patient's satisfaction.     Stan Head

## 2022-12-18 NOTE — Plan of Care (Signed)

## 2022-12-18 NOTE — Anesthesia Preprocedure Evaluation (Addendum)
Anesthesia Evaluation  Patient identified by MRN, date of birth, ID band Patient awake    Reviewed: Allergy & Precautions, H&P , NPO status , Patient's Chart, lab work & pertinent test results, reviewed documented beta blocker date and time   Airway Mallampati: II  TM Distance: >3 FB Neck ROM: Full    Dental no notable dental hx. (+) Teeth Intact, Dental Advisory Given   Pulmonary asthma    Pulmonary exam normal breath sounds clear to auscultation       Cardiovascular hypertension, Pt. on medications and Pt. on home beta blockers + CAD  + dysrhythmias Atrial Fibrillation  Rhythm:Regular Rate:Normal     Neuro/Psych  PSYCHIATRIC DISORDERS Anxiety Depression     Neuromuscular disease negative neurological ROS  negative psych ROS   GI/Hepatic Neg liver ROS,GERD  Medicated and Controlled,,  Endo/Other  Hypothyroidism Hyperthyroidism   Renal/GU Renal InsufficiencyRenal diseasenegative Renal ROS  negative genitourinary   Musculoskeletal  (+) Arthritis , Osteoarthritis,  Fibromyalgia -  Abdominal   Peds  Hematology negative hematology ROS (+) Blood dyscrasia, anemia   Anesthesia Other Findings   Reproductive/Obstetrics negative OB ROS                             Anesthesia Physical Anesthesia Plan  ASA: 4 and emergent  Anesthesia Plan: MAC   Post-op Pain Management: Minimal or no pain anticipated   Induction: Intravenous  PONV Risk Score and Plan: 2 and Propofol infusion and Treatment may vary due to age or medical condition  Airway Management Planned: Nasal Cannula, Natural Airway, Simple Face Mask and Mask  Additional Equipment: None  Intra-op Plan:   Post-operative Plan:   Informed Consent: I have reviewed the patients History and Physical, chart, labs and discussed the procedure including the risks, benefits and alternatives for the proposed anesthesia with the patient or  authorized representative who has indicated his/her understanding and acceptance.    Continue DNR and Discussed DNR with patient.   Dental advisory given  Plan Discussed with: CRNA and Anesthesiologist  Anesthesia Plan Comments: (Discussed with patient her DNR status and she is good with cardiac medications but NO chest compressions or intubation )        Anesthesia Quick Evaluation

## 2022-12-18 NOTE — Op Note (Signed)
[facility] Patient Name: Kaitlyn Adams Procedure Date: 12/18/2022 MRN: [id number] Attending MD: Iva Boop , MD, [id number] Date of Birth: July 21, 1951 CSN: [id number] Age: 72 Admit Type: Inpatient Procedure:                Colonoscopy Indications:              Iron deficiency anemia Providers:                Iva Boop, MD, Zoe Lan, RN, Kandice Robinsons, Technician Referring MD:              Medicines:                Monitored Anesthesia Care Complications:            No immediate complications. Estimated Blood Loss:     Estimated blood loss was minimal. Procedure:                Pre-Anesthesia Assessment:                           - Prior to the procedure, a History and Physical                            was performed, and patient medications and                            allergies were reviewed. The patient's tolerance of                            previous anesthesia was also reviewed. The risks                            and benefits of the procedure and the sedation                            options and risks were discussed with the patient.                            All questions were answered, and informed consent                            was obtained. Prior Anticoagulants: The patient has                            taken no anticoagulant or antiplatelet agents. ASA                            Grade Assessment: IV - A patient with severe                            systemic disease that is a constant threat to life.  After reviewing the risks and benefits, the patient                            was deemed in satisfactory condition to undergo the                            procedure.                           - Prior to the procedure, a History and Physical                            was performed, and patient medications and                            allergies were reviewed. The patient's  tolerance of                            previous anesthesia was also reviewed. The risks                            and benefits of the procedure and the sedation                            options and risks were discussed with the patient.                            All questions were answered, and informed consent                            was obtained. Prior Anticoagulants: The patient has                            taken no anticoagulant or antiplatelet agents. ASA                            Grade Assessment: IV - A patient with severe                            systemic disease that is a constant threat to life.                            After reviewing the risks and benefits, the patient                            was deemed in satisfactory condition to undergo the                            procedure.                           After obtaining informed consent, the colonoscope  was passed under direct vision. Throughout the                            procedure, the patient's blood pressure, pulse, and                            oxygen saturations were monitored continuously. The                            [id number]-HQ190L ([id number] Olympus colonoscope was                            introduced through the anus and advanced to the the                            cecum, identified by appendiceal orifice and                            ileocecal valve. The colonoscopy was performed                            without difficulty. The patient tolerated the                            procedure well. The quality of the bowel                            preparation was good. The ileocecal valve,                            appendiceal orifice, and rectum were photographed.                            The bowel preparation used was MoviPrep via split                            dose instruction. Scope In: 9:56:52 AM Scope Out: 10:16:47 AM Scope Withdrawal Time: 0 hours 14  minutes 6 seconds  Total Procedure Duration: 0 hours 19 minutes 55 seconds  Findings:      The digital rectal exam findings include anal stricture and       non-thrombosed external hemorrhoids.      Two sessile polyps were found in the distal transverse colon and distal       ascending colon. The polyps were diminutive in size. These polyps were       removed with a cold snare. Resection and retrieval were complete.       Verification of patient identification for the specimen was done.       Estimated blood loss was minimal.      A post polypectomy scar was found at the ileocecal valve. There was no       evidence of the previous polyp.      A diffuse area of moderate melanosis was found in the entire colon.      The exam was otherwise without abnormality on direct and retroflexion  views. Impression:               - Anal stricture and non-thrombosed external                            hemorrhoids found on digital rectal exam. Benign                            stenosis and stricture - dilated with 5th digit                            today                           - Two diminutive polyps in the distal transverse                            colon and in the distal ascending colon, removed                            with a cold snare. Resected and retrieved.                           - Post-polypectomy scar at the ileocecal valve.                           - Melanosis in the colon.                           - The examination was otherwise normal on direct                            and retroflexion views.                           - Personal history of colonic polyps. 2018 10 mm                            adenoma + 18 mm IC valve adenoma removed ([facility] and                            [facility], respectively) Moderate Sedation:      Not Applicable - Patient had care per Anesthesia. Recommendation:           - Patient has a contact number available for                             emergencies. The signs and symptoms of potential                            delayed complications were discussed with the                            patient. Return to normal activities tomorrow.  Written discharge instructions were provided to the                            patient.                           - Resume previous diet.                           - Continue present medications.                           - No recommendation at this time regarding repeat                            colonoscopy due to age.                           - She had parenteral iron here - needs ferrous                            sulfate at dc and will need to f/u PCP and should                            have CBC and ferritin routinely 2-4 x a year once                            normalized. I will f/u colon path and duodenal bxs                            and inform patient and PCP                           - OK to dc today Procedure Code(s):        --- Professional ---                           [phone], Colonoscopy, flexible; with removal of                            tumor(s), polyp(s), or other lesion(s) by snare                            technique Diagnosis Code(s):        --- Professional ---                           D12.3, Benign neoplasm of transverse colon (hepatic                            flexure or splenic flexure)                           D12.2, Benign neoplasm of ascending colon  Z61.096, Other specified postprocedural states                           K63.89, Other specified diseases of intestine                           K64.4, Residual hemorrhoidal skin tags                           K62.4, Stenosis of anus and rectum                           D50.9, Iron deficiency anemia, unspecified CPT copyright 2022 [employer]. All rights reserved. The codes documented in this report are preliminary and upon coder review may  be  revised to meet current compliance requirements. Iva Boop, MD 12/18/2022 10:35:33 AM This report has been signed electronically. Number of Addenda: 0

## 2022-12-18 NOTE — Transfer of Care (Signed)
Immediate Anesthesia Transfer of Care Note  Patient: Kaitlyn Adams  Procedure(s) Performed: ESOPHAGOGASTRODUODENOSCOPY (EGD) WITH PROPOFOL COLONOSCOPY WITH PROPOFOL BIOPSY POLYPECTOMY  Patient Location: PACU  Anesthesia Type:MAC  Level of Consciousness: awake, alert , oriented, and patient cooperative  Airway & Oxygen Therapy: Patient Spontanous Breathing and Patient connected to nasal cannula oxygen  Post-op Assessment: Report given to RN and Post -op Vital signs reviewed and stable  Post vital signs: Reviewed and stable  Last Vitals:  Vitals Value Taken Time  BP    Temp 36.4 C 12/18/22 1028  Pulse 69 12/18/22 1027  Resp 15 12/18/22 1029  SpO2 100 % 12/18/22 1027  Vitals shown include unvalidated device data.  Last Pain:  Vitals:   12/18/22 1028  TempSrc:   PainSc: 0-No pain      Patients Stated Pain Goal: 0 (12/17/22 2000)  Complications: No notable events documented.

## 2022-12-18 NOTE — Discharge Summary (Addendum)
Physician Discharge Summary   Patient: Kaitlyn Adams MRN: [id number] DOB: [birth year]  Admit date:     12/16/2022  Discharge date: 12/18/22  Discharge Physician: Brendia Sacks   PCP: Sheliah Hatch, MD   Recommendations at discharge:   Severe iron deficiency anemia  S/p IV iron 4/27. Home on oral iron. Could consider repeating IV iron as an outpatient. F/u PCP and should have CBC and ferritin routinely 2-4 x a year once normalized.  Esophageal candidiasis  Fluconazole 200 mg x 1 then 100 mg daily x 20 more days (21 days total). Consider chronic suppressive tx since takes long-term steroids (? Nystatin swish/swllow?)  CKD stage IIIb suspected. Creatinine normal March 2022 but was elevated in October 2023.  Difficult to say baseline at this time. Follow-up as an outpatient.   Discharge Diagnoses: Principal Problem:   Symptomatic anemia Active Problems:   GERD, SEVERE   History of colonic polyps   PAT (paroxysmal atrial tachycardia)   Adrenal insufficiency (HCC)   CKD (chronic kidney disease) stage 3, GFR 30-59 ml/min (HCC)   Iron deficiency anemia   Candida esophagitis (HCC)   Benign neoplasm of ascending colon   Benign neoplasm of transverse colon   Anal stricture  Resolved Problems:   * No resolved hospital problems. *  Hospital Course: 72 year old woman PMH Addison's disease, iron deficiency anemia 2018, workup at that time revealed possible small bowel erosion.  Colon polyps.  Presented with fatigue, generalized weakness.  Found to have marked anemia with hemoglobin of 5.6.  Admitted for further evaluation.  Hemoglobin appropriately increased with PRBC transfusion.  Hemoglobin stable.  No evidence of bleeding.  Iron deficiency anemia treated with IV iron.  Seen by gastroenterology.  Underwent EGD and colonoscopy results as below.  Cleared for discharge.  Outpatient follow-up planned.  * Symptomatic anemia Severe iron deficiency anemia No evidence of GI  bleed Hemoglobin 5.6 on admission, improved appropriately with 2 units PRBC transfusion. Anemia panel consistent with severe iron deficiency. S/p IV iron 4/27. Home on oral iron  F/u PCP and should have CBC and ferritin routinely 2-4 x a year once normalized.  Esophageal candidiasis  Fluconazole 200 mg x 1 then 100 mg daily x 20 more days (21 days total). Consider chronic suppressive tx since takes       long-term steroids (? Nystatin swish/swllow?)  Hypokalemia Secondary to bowel prep. Replete. Mg2+ WNL   GERD Symptoms several days a week. Continue PPI, famotidine.   CKD stage IIIb suspected. Creatinine normal March 2022 but was elevated in October 2023.  Difficult to say baseline at this time. Stable. Follow-up as an outpatient.    Adrenal insufficiency (HCC) Hemodynamic stable.  Cont Cortef + Florinef   PAT (paroxysmal atrial tachycardia) On digoxin for PAT.  Single episode of PAF years ago but mostly PAT. Not on chronic AC due to single episode PAF in past and h/o GIB with iron def anemia in past Cont digoxin   Elevated troponin.  Clinically insignificant. Elevated D-dimer.  Clinically insignificant.      Consultants:  GI  Procedures performed: Colonoscopy   Impression:               - Anal stricture and non-thrombosed external                            hemorrhoids found on digital rectal exam. Benign  stenosis and stricture - dilated with 5th digit                            today                           - Two diminutive polyps in the distal transverse                            colon and in the distal ascending colon, removed                            with a cold snare. Resected and retrieved.                           - Post-polypectomy scar at the ileocecal valve.                           - Melanosis in the colon.                           - The examination was otherwise normal on direct                            and retroflexion  views.                           - Personal history of colonic polyps. 2018 10 mm                            adenoma + 18 mm IC valve adenoma removed ([facility]                            [facility], respectively) Moderate Sedation:      Not Applicable - Patient had care per Anesthesia. Recommendation:           - Patient has a contact number available for                            emergencies. The signs and symptoms of potential                            delayed complications were discussed with the                            patient. Return to normal activities tomorrow.                            Written discharge instructions were provided to the                            patient.                           - Resume previous diet.                           -  Continue present medications.                           - No recommendation at this time regarding repeat                            colonoscopy due to age.                           - She had parenteral iron here - needs ferrous                            sulfate at dc and will need to f/u PCP and should                            have CBC and ferritin routinely 2-4 x a year once                            normalized. I will f/u colon path and duodenal bxs                            and inform patient and PCP                           - OK to dc today  EGD Impression:               - Esophageal plaques were found, secondary to                            candidiasis.                           - The examination was otherwise normal.                           - Biopsies were taken with a cold forceps for                            evaluation of celiac disease. Moderate Sedation:      Not Applicable - Patient had care per Anesthesia. Recommendation:           - See the other procedure note for documentation of                            additional recommendations.                           - treat Candida w/ fluconazole 200 mg x 1  then 100                            mg daily x 20 more days 921 days total)                           Consider chronic suppressive tx since takes  long-term steroids (? Nystatin swish/swllow?)                           I will follow-up pathology on duodenal biopsies to                            r/o Celiac Disposition: Home Diet recommendation:  Regular diet DISCHARGE MEDICATION: Allergies as of 12/18/2022       Reactions   Fenofibrate Other (See Comments)   GI side effects   Erythromycin Nausea And Vomiting   REACTION: nausea and vomiting   Guaifenesin Er Palpitations   Sulfonamide Derivatives Nausea And Vomiting   REACTION: nausea and vomiting        Medication List     STOP taking these medications    omeprazole 40 MG capsule Commonly known as: PRILOSEC       TAKE these medications    albuterol 108 (90 Base) MCG/ACT inhaler Commonly known as: VENTOLIN HFA INHALE 2 PUFFS INTO THE LUNGS EVERY 6 HOURS AS NEEDED FOR WHEEZING OR SHORTNESS OF BREATH   ALPRAZolam 0.25 MG tablet Commonly known as: XANAX Take 1 tablet (0.25 mg total) by mouth 2 (two) times daily as needed for anxiety.   amitriptyline 50 MG tablet Commonly known as: ELAVIL Take one tablet at bedtime   atorvastatin 20 MG tablet Commonly known as: LIPITOR TAKE 1 TABLET(20 MG) BY MOUTH DAILY   benzonatate 200 MG capsule Commonly known as: TESSALON Take 1 capsule (200 mg total) by mouth 3 (three) times daily as needed for cough.   calcium carbonate 500 MG chewable tablet Commonly known as: TUMS - dosed in mg elemental calcium Chew 1-2 tablets by mouth 2 (two) times daily as needed for indigestion or heartburn.   digoxin 0.125 MG tablet Commonly known as: Lanoxin Take 1 tablet (0.125 mg total) by mouth daily.   diphenhydramine-acetaminophen 25-500 MG Tabs tablet Commonly known as: TYLENOL PM Take 1 tablet by mouth at bedtime as needed (pain).   famotidine 40 MG  tablet Commonly known as: PEPCID Take 40 mg by mouth daily as needed for heartburn or indigestion.   ferrous sulfate 325 (65 FE) MG EC tablet Take 1 tablet (325 mg total) by mouth daily with breakfast.   fluconazole 100 MG tablet Commonly known as: DIFLUCAN Take 1 tablet (100 mg total) by mouth daily. Start 4/29 in morning. Start taking on: December 19, 2022   fludrocortisone 0.1 MG tablet Commonly known as: FLORINEF Take one tablet daily   furosemide 20 MG tablet Commonly known as: LASIX Take 1 tablet (20 mg total) by mouth daily as needed for edema.   hydrocortisone 10 MG tablet Commonly known as: CORTEF Take 10 mg by mouth 2 (two) times daily. Patient reports taking BID 09/07/2022   hydroxypropyl methylcellulose / hypromellose 2.5 % ophthalmic solution Commonly known as: ISOPTO TEARS / GONIOVISC Place 2 drops into both eyes daily as needed for dry eyes.   levothyroxine 25 MCG tablet Commonly known as: SYNTHROID Take 25 mcg by mouth every morning.   magic mouthwash w/lidocaine Soln Take 5 mLs by mouth 4 (four) times daily as needed for mouth pain.   pantoprazole 40 MG tablet Commonly known as: PROTONIX Take 1 tablet (40 mg total) by mouth daily.   polyethylene glycol 17 g packet Commonly known as: MiraLax Take 17 g by mouth daily as needed for mild constipation.   potassium chloride  SA 20 MEQ tablet Commonly known as: KLOR-CON M Take 2 tablets (40 mEq total) by mouth daily.   Pulmicort Flexhaler 90 MCG/ACT inhaler Generic drug: Budesonide Inhale 2 puffs into the lungs 2 (two) times daily. What changed:  when to take this reasons to take this   sertraline 25 MG tablet Commonly known as: ZOLOFT Take 1 tablet (25 mg total) by mouth daily.        Follow-up Information     Sheliah Hatch, MD Follow up on 12/23/2022.   Specialty: Family Medicine Why: Keep scheduled appointment Contact information: [address] [place]  [address] [phone]                Feels fine  Discharge Exam: [facility] Weights   12/16/22 2056 12/18/22 0931  Weight: 46.7 kg 46.7 kg   Physical Exam Vitals reviewed.  Constitutional:      General: She is not in acute distress.    Appearance: She is not ill-appearing or toxic-appearing.  Cardiovascular:     Rate and Rhythm: Normal rate and regular rhythm.     Heart sounds: No murmur heard. Pulmonary:     Effort: Pulmonary effort is normal. No respiratory distress.     Breath sounds: No wheezing, rhonchi or rales.  Neurological:     Mental Status: She is alert.  Psychiatric:        Mood and Affect: Mood normal.        Behavior: Behavior normal.      Condition at discharge: good  The results of significant diagnostics from this hospitalization (including imaging, microbiology, ancillary and laboratory) are listed below for reference.   Imaging Studies: DG Chest 2 View  Result Date: 12/16/2022 CLINICAL DATA:  Shortness of breath EXAM: CHEST - 2 VIEW COMPARISON:  CXR 10/18/22 FINDINGS: No pleural effusion. No pneumothorax. No focal airspace opacity. Normal cardiac and mediastinal contours. No radiographically apparent displaced rib fractures. Visualized upper abdomen is unremarkable. Vertebral body heights are maintained. IMPRESSION: No focal airspace opacity. Electronically Signed   By: Lorenza Cambridge M.D.   On: 12/16/2022 13:51    Microbiology: Results for orders placed or performed in visit on 02/26/19  Urine Culture     Status: Abnormal   Collection Time: 02/26/19  2:49 PM   Specimen: Urine  Result Value Ref Range Status   MICRO NUMBER: [id number]  Final   SPECIMEN QUALITY: Adequate  Final   Sample Source URINE  Final   STATUS: FINAL  Final   ISOLATE 1: Escherichia coli (A)  Final    Comment: 10,000-50,000 CFU/mL of Escherichia coli      Susceptibility   Escherichia coli - URINE CULTURE, REFLEX    AMOX/CLAVULANIC <=2 Sensitive     AMPICILLIN <=2 Sensitive      AMPICILLIN/SULBACTAM <=2 Sensitive     CEFAZOLIN* <=4 Not Reportable      * For infections other than uncomplicated UTIcaused by E. coli, K. pneumoniae or P. mirabilis:Cefazolin is resistant if MIC > or = 8 mcg/mL.(Distinguishing susceptible versus intermediatefor isolates with MIC < or = 4 mcg/mL requiresadditional testing.)For uncomplicated UTI caused by E. coli,K. pneumoniae or P. mirabilis: Cefazolin issusceptible if MIC <32 mcg/mL and predictssusceptible to the oral agents cefaclor, cefdinir,cefpodoxime, cefprozil, cefuroxime, cephalexinand loracarbef.    CEFEPIME <=1 Sensitive     CEFTRIAXONE <=1 Sensitive     CIPROFLOXACIN <=0.25 Sensitive     LEVOFLOXACIN <=0.12 Sensitive     ERTAPENEM <=0.5 Sensitive  GENTAMICIN <=1 Sensitive     IMIPENEM <=0.25 Sensitive     NITROFURANTOIN <=16 Sensitive     PIP/TAZO <=4 Sensitive     TOBRAMYCIN <=1 Sensitive     TRIMETH/SULFA* <=20 Sensitive      * For infections other than uncomplicated UTIcaused by E. coli, K. pneumoniae or P. mirabilis:Cefazolin is resistant if MIC > or = 8 mcg/mL.(Distinguishing susceptible versus intermediatefor isolates with MIC < or = 4 mcg/mL requiresadditional testing.)For uncomplicated UTI caused by E. coli,K. pneumoniae or P. mirabilis: Cefazolin issusceptible if MIC <32 mcg/mL and predictssusceptible to the oral agents cefaclor, cefdinir,cefpodoxime, cefprozil, cefuroxime, cephalexinand loracarbef.Legend:S = Susceptible  I = IntermediateR = Resistant  NS = Not susceptible* = Not tested  NR = Not reported**NN = See antimicrobic comments    Labs: CBC: Recent Labs  Lab 12/16/22 1451 12/17/22 0801 12/18/22 0835  WBC 12.0* 11.4* 9.5  NEUTROABS 9.6*  --   --   HGB 5.6* 7.9* 8.5*  HCT 21.3* 29.1* 31.8*  MCV 68.7* 72.0* 73.4*  PLT 586* 432* 448*   Basic Metabolic Panel: Recent Labs  Lab 12/16/22 1451 12/16/22 1539 12/17/22 0801 12/18/22 0835  NA 138  --  133* 140  K 2.6*  --  3.7 2.3*  CL 111  --  112* 118*   CO2 16*  --  14* 12*  GLUCOSE 127*  --  92 76  BUN 14  --  13 10  CREATININE 1.64*  --  1.51* 1.48*  CALCIUM 9.7  --  9.3 8.2*  MG  --  1.3* 2.0 2.0  PHOS  --   --  2.4* 2.9   Liver Function Tests: No results for input(s): "AST", "ALT", "ALKPHOS", "BILITOT", "PROT", "ALBUMIN" in the last 168 hours. CBG: No results for input(s): "GLUCAP" in the last 168 hours.  Discharge time spent: greater than 30 minutes.  Signed: Brendia Sacks, MD [facility] 12/18/2022

## 2022-12-18 NOTE — Anesthesia Procedure Notes (Signed)
Procedure Name: MAC Date/Time: 12/18/2022 9:37 AM  Performed by: Wynonia Sours, CRNAPre-anesthesia Checklist: Patient identified, Emergency Drugs available, Suction available, Patient being monitored and Timeout performed Patient Re-evaluated:Patient Re-evaluated prior to induction Oxygen Delivery Method: Simple face mask Preoxygenation: Pre-oxygenation with 100% oxygen Placement Confirmation: positive ETCO2 Dental Injury: Teeth and Oropharynx as per pre-operative assessment

## 2022-12-19 ENCOUNTER — Telehealth: Payer: Self-pay

## 2022-12-19 ENCOUNTER — Other Ambulatory Visit: Payer: Self-pay | Admitting: Family Medicine

## 2022-12-19 ENCOUNTER — Encounter (HOSPITAL_COMMUNITY): Payer: Self-pay | Admitting: Internal Medicine

## 2022-12-19 ENCOUNTER — Ambulatory Visit: Payer: Self-pay

## 2022-12-19 NOTE — Transitions of Care (Post Inpatient/ED Visit) (Signed)
   12/19/2022  Name: Kaitlyn Adams MRN: [id number] DOB: March 26, 1951  Today's TOC FU Call Status: Today's TOC FU Call Status:: Successful TOC FU Call Competed TOC FU Call Complete Date: 12/19/22  Transition Care Management Follow-up Telephone Call Date of Discharge: 12/18/22 Discharge Facility: [facility]) Type of Discharge: Inpatient Admission Primary Inpatient Discharge Diagnosis:: "symptomatic anemia,acute hypokalemia" How have you been since you were released from the hospital?: Better (Pt states "it felt good to sleep in her own bed last night." She has been feeling good. Denies any GI sxs/issues. LBM yest. Appetite has been good.) Any questions or concerns?: No  Items Reviewed: Did you receive and understand the discharge instructions provided?: Yes Medications obtained and verified?: Yes (Medications Reviewed) Any new allergies since your discharge?: No Dietary orders reviewed?: Yes Type of Diet Ordered:: loa salt/heart healthy Do you have support at home?: Yes People in Home: spouse Name of Support/Comfort Primary Source: [facility] and Equipment/Supplies: Were Home Health Services Ordered?: NA Any new equipment or medical supplies ordered?: NA  Functional Questionnaire: Do you need assistance with bathing/showering or dressing?: No Do you need assistance with meal preparation?: No Do you need assistance with eating?: No Do you have difficulty maintaining continence: No Do you need assistance with getting out of bed/getting out of a chair/moving?: No Do you have difficulty managing or taking your medications?: No  Follow up appointments reviewed: PCP Follow-up appointment confirmed?: Yes Date of PCP follow-up appointment?: 12/23/22 Follow-up Provider: Dr. Beverely Low Specialist [facility] Follow-up appointment confirmed?: NA Do you need transportation to your follow-up appointment?: No Do you understand care options if your condition(s) worsen?: Yes-patient  verbalized understanding   TOC Interventions Today    Flowsheet Row Most Recent Value  TOC Interventions   TOC Interventions Discussed/Reviewed TOC Interventions Discussed      Interventions Today    Flowsheet Row Most Recent Value  General Interventions   General Interventions Discussed/Reviewed General Interventions Discussed, Doctor Visits  Doctor Visits Discussed/Reviewed Doctor Visits Discussed, Specialist, PCP  PCP/Specialist Visits Compliance with follow-up visit  Education Interventions   Education Provided Provided Education  Provided Verbal Education On Nutrition, Medication, When to see the doctor  Nutrition Interventions   Nutrition Discussed/Reviewed Nutrition Discussed, Adding fruits and vegetables, Decreasing salt  Pharmacy Interventions   Pharmacy Dicussed/Reviewed Pharmacy Topics Discussed, Medications and their functions  Safety Interventions   Safety Discussed/Reviewed Safety Discussed        [facility] Care Management Care Management Community Coordinator Direct Phone: [phone] Toll Free: [phone] Fax: [phone]

## 2022-12-19 NOTE — Chronic Care Management (AMB) (Signed)
   12/19/2022  Kaitlyn Adams [birth year] [id number]   Reason for Encounter: Patient is not currently enrolled in the CCM program. CCM status changed to previously enrolled  Alto Denver RN, MSN, CCM RN Care Manager  Chronic Care Management Direct Number: [phone]

## 2022-12-21 LAB — SURGICAL PATHOLOGY

## 2022-12-23 ENCOUNTER — Ambulatory Visit (INDEPENDENT_AMBULATORY_CARE_PROVIDER_SITE_OTHER): Payer: Medicare Other | Admitting: Family Medicine

## 2022-12-23 ENCOUNTER — Encounter: Payer: Self-pay | Admitting: Family Medicine

## 2022-12-23 VITALS — BP 120/76 | HR 74 | Temp 97.9°F | Resp 17 | Ht 63.0 in | Wt 103.1 lb

## 2022-12-23 DIAGNOSIS — J410 Simple chronic bronchitis: Secondary | ICD-10-CM | POA: Diagnosis not present

## 2022-12-23 DIAGNOSIS — D509 Iron deficiency anemia, unspecified: Secondary | ICD-10-CM

## 2022-12-23 DIAGNOSIS — J329 Chronic sinusitis, unspecified: Secondary | ICD-10-CM | POA: Diagnosis not present

## 2022-12-23 DIAGNOSIS — B9689 Other specified bacterial agents as the cause of diseases classified elsewhere: Secondary | ICD-10-CM

## 2022-12-23 DIAGNOSIS — B3781 Candidal esophagitis: Secondary | ICD-10-CM

## 2022-12-23 LAB — CBC WITH DIFFERENTIAL/PLATELET
Basophils Absolute: 0.1 10*3/uL (ref 0.0–0.1)
Basophils Relative: 0.6 % (ref 0.0–3.0)
Eosinophils Absolute: 0.1 10*3/uL (ref 0.0–0.7)
Eosinophils Relative: 0.5 % (ref 0.0–5.0)
HCT: 34.9 % — ABNORMAL LOW (ref 36.0–46.0)
Hemoglobin: 10.3 g/dL — ABNORMAL LOW (ref 12.0–15.0)
Lymphocytes Relative: 8.8 % — ABNORMAL LOW (ref 12.0–46.0)
Lymphs Abs: 1.1 10*3/uL (ref 0.7–4.0)
MCHC: 29.5 g/dL — ABNORMAL LOW (ref 30.0–36.0)
MCV: 68.5 fl — ABNORMAL LOW (ref 78.0–100.0)
Monocytes Absolute: 0.7 10*3/uL (ref 0.1–1.0)
Monocytes Relative: 5.7 % (ref 3.0–12.0)
Neutro Abs: 10.9 10*3/uL — ABNORMAL HIGH (ref 1.4–7.7)
Neutrophils Relative %: 84.4 % — ABNORMAL HIGH (ref 43.0–77.0)
Platelets: 507 10*3/uL — ABNORMAL HIGH (ref 150.0–400.0)
RBC: 5.1 Mil/uL (ref 3.87–5.11)
RDW: 25.3 % — ABNORMAL HIGH (ref 11.5–15.5)
WBC: 12.9 10*3/uL — ABNORMAL HIGH (ref 4.0–10.5)

## 2022-12-23 MED ORDER — PULMICORT FLEXHALER 90 MCG/ACT IN AEPB: 2.0000 | INHALATION_SPRAY | Freq: Two times a day (BID) | RESPIRATORY_TRACT | 3 refills | Status: DC

## 2022-12-23 MED ORDER — AMOXICILLIN 875 MG PO TABS: 875.0000 mg | ORAL_TABLET | Freq: Two times a day (BID) | ORAL | 0 refills | Status: AC

## 2022-12-23 MED ORDER — NAPROXEN 500 MG PO TABS
500.0000 mg | ORAL_TABLET | Freq: Two times a day (BID) | ORAL | 0 refills | Status: DC
Start: 2022-12-23 — End: 2023-01-31

## 2022-12-23 NOTE — Patient Instructions (Addendum)
Follow up as scheduled or as needed We'll notify you of your lab results and make any changes if needed FINISH the Fluconazole as directed START the Amoxicillin twice daily- take w/ food Drink LOTS of fluids ADD the Pulmicort inhaler- 2 puffs twice daily TAKE the Naproxen twice daily for pain x1 week- take w/ food Call with any questions or concerns Hang in there!

## 2022-12-23 NOTE — Progress Notes (Signed)
   Subjective:    Patient ID: Kaitlyn Adams, female    DOB: Apr 07, 1951, 72 y.o.   MRN: [id number]  HPI Hospital f/u- pt was admitted 4/26-28 w/ symptomatic anemia.  Hgb 5.6.  She received 2 units PRBCs and Hgb increased appropriately (8.5 at d/c).  No evidence of bleeding.  Received IV iron.  GI did EGD (esophageal candidiasis found) and colonoscopy.  She was d/c'd on Fluconazole 100mg  daily x20 days and they recommended possible suppressive tx due to chronic steroid use.  Pt reports she continues to feel fatigued.  Husband doesn't see much- if any- improvement.  She continues to be unsteady on her feet.  Using walker at home.  No dark or tarry stools.  No obvious bleeding.  Denies trouble swallowing.  Now on oral iron.  Now coughing- 'so hard my whole body shakes'.  + sinus pain.  Using delsym and robitussin w/o relief but some relief w/ Tessalon.not using Pulmicort or Albuterol.  Pt reports doing better with eating regularly   Review of Systems For ROS see HPI     Objective:   Physical Exam Vitals reviewed.  Constitutional:      General: She is not in acute distress.    Appearance: She is well-developed. She is ill-appearing.  HENT:     Head: Normocephalic and atraumatic.     Right Ear: Tympanic membrane normal.     Left Ear: Tympanic membrane normal.     Nose: Mucosal edema and congestion present. No rhinorrhea.     Right Sinus: Maxillary sinus tenderness and frontal sinus tenderness present.     Left Sinus: Maxillary sinus tenderness and frontal sinus tenderness present.     Mouth/Throat:     Pharynx: Uvula midline. Posterior oropharyngeal erythema present. No oropharyngeal exudate.  Eyes:     Conjunctiva/sclera: Conjunctivae normal.     Pupils: Pupils are equal, round, and reactive to light.  Cardiovascular:     Rate and Rhythm: Normal rate and regular rhythm.     Heart sounds: Normal heart sounds.  Pulmonary:     Effort: Pulmonary effort is normal. No respiratory  distress.     Breath sounds: Wheezing present.  Musculoskeletal:     Cervical back: Normal range of motion and neck supple.     Right lower leg: No edema.     Left lower leg: No edema.  Lymphadenopathy:     Cervical: No cervical adenopathy.  Skin:    General: Skin is warm and dry.  Neurological:     General: No focal deficit present.     Mental Status: She is alert and oriented to person, place, and time.     Cranial Nerves: No cranial nerve deficit.     Motor: No weakness.     Coordination: Coordination normal.  Psychiatric:        Mood and Affect: Mood normal.        Behavior: Behavior normal.        Thought Content: Thought content normal.           Assessment & Plan:  Bacterial sinusitis- new.  Pt's sxs are consistent w/ infxn and given her debilitated state and recent hospitalization, will tx w/ abx.   Reviewed supportive care and red flags that should prompt return.  Pt expressed understanding and is in agreement w/ plan.

## 2022-12-24 LAB — IRON,TIBC AND FERRITIN PANEL
%SAT: 6 % (calc) — ABNORMAL LOW (ref 16–45)
Ferritin: 44 ng/mL (ref 16–288)
Iron: 23 ug/dL — ABNORMAL LOW (ref 45–160)
TIBC: 377 mcg/dL (calc) (ref 250–450)

## 2022-12-26 ENCOUNTER — Other Ambulatory Visit: Payer: Self-pay

## 2022-12-26 ENCOUNTER — Telehealth: Payer: Self-pay

## 2022-12-26 DIAGNOSIS — D509 Iron deficiency anemia, unspecified: Secondary | ICD-10-CM

## 2022-12-26 NOTE — Telephone Encounter (Signed)
-----   Message from Sheliah Hatch, MD sent at 12/26/2022  7:29 AM EDT ----- Your iron remains quite low.  I am going to refer you to hematology (blood doctors) in hopes of better treating your anemia (dx iron deficiency anemia).  Please note- they work out of the cancer center so do not be surprised if you receive a call from the cancer center or oncology.  I just like to prepare people!

## 2022-12-26 NOTE — Telephone Encounter (Signed)
Pt aware of lab results and Referral for Hematology has been placed

## 2022-12-30 ENCOUNTER — Encounter: Payer: Self-pay | Admitting: Internal Medicine

## 2023-01-07 ENCOUNTER — Other Ambulatory Visit: Payer: Self-pay | Admitting: Family Medicine

## 2023-01-08 NOTE — Assessment & Plan Note (Signed)
Ongoing issue for pt.  She was admitted w/ Hgb of 5.6.  Received 2 units and Hgb increased appropriately.  Was 8.5 at d/c.  Need to ensure hgb is stable- will get CBC.  Pt had EGD and colonoscopy.  Cautioned her against using NSAIDs due to increased risk of bleeding.  Will continue to follow.

## 2023-01-08 NOTE — Assessment & Plan Note (Signed)
Ongoing issue for pt.  Has albuterol available but has not been on a controller inhaler.  Will add ICS and refer to pulmonary.  Pt expressed understanding and is in agreement w/ plan.

## 2023-01-08 NOTE — Assessment & Plan Note (Signed)
New.  Dx'd on EGD.  Encouraged her to complete tx w/ Fluconazole.  Will follow w/ GI.

## 2023-01-24 ENCOUNTER — Other Ambulatory Visit: Payer: Self-pay | Admitting: *Deleted

## 2023-01-24 DIAGNOSIS — D509 Iron deficiency anemia, unspecified: Secondary | ICD-10-CM

## 2023-01-30 NOTE — Progress Notes (Unsigned)
New Hematology/Oncology Consult   Requesting MD: Dr. Neena Rhymes  [phone]      Reason for Consult: Iron deficiency anemia  HPI: Ms. Cuzzort is a 72 year old woman referred for iron deficiency anemia.  She was hospitalized 12/16/2022 through 12/18/2022 with severe anemia.  Ferritin returned at 3.  She was transfused 2 units of blood.  She received ferrous gluconate 125 mg IV on 12/17/2022.  She underwent an upper endoscopy and colonoscopy on 12/18/2022-esophageal plaques were found secondary to candidiasis, examination otherwise normal; post polypectomy scar at the ileocecal valve, melanosis in the colon, anal stricture and nonthrombosed external hemorrhoids found on digital rectal exam, 2 polyps in the distal transverse colon and distal ascending colon.  Labs 12/23/2022-hemoglobin 10.3, MCV 68, white count 12.9, platelets 507,000, iron 23, TIBC 377, percent saturation 6, ferritin 44.  Review of prior labs-06/21/2022 hemoglobin 11.7, MCV 74; 11/06/2020 hemoglobin 15.6, MCV 83; 10/02/2019 hemoglobin 15, MCV 87; 11/01/2018 hemoglobin 14.5, MCV 84.  She was hospitalized in September 2018 with severe symptomatic microcytic/iron deficiency anemia.  She is not aware of any bleeding.  No change in baseline bowel habits.  She eats a regular diet.  She craves ice.  She notes cracks at the corners of the lips.  She had tongue soreness before completing treatment of Candida esophagitis.  She has been taking oral iron daily for about the past 6 weeks.  No constipation or nausea.     Past Medical History:  Diagnosis Date   Addison disease (HCC)    Anemia    Asthma    - normal spirometry 2009 FEV1  >90% predicted.  - methacoline challenge neg 11/09   Bronchitis    recurrent   Cataract    Chronic cough    - sinus CT 03-23-10 > neg.  - allergy profile 03-23-10 > nl, IgE 14.  - flutter valve rx 03-23-10   Fibromyalgia    GERD (gastroesophageal reflux disease)    exacerbating VCD   Glaucoma     History of blood transfusion    Hyperlipidemia    Hypertension    Hyperthyroidism    with hot nodule.  - total thyroidectomy 11-05-08 benign -> subsequent hypothyroidism.   Hypokalemia    Hypothyroidism    Hypothyroidism    a. following thyroidectomy.   Mild CAD    a. LHC 04/2015 - mild nonobstructive CAD with 30% dLAD, 40% D1, 25% pLCx, 30% mRCA, 30% RPDA, normal EF >65% with normal LVEDP.   Orthostatic hypotension    Paroxysmal atrial fibrillation (HCC)    Renal insufficiency    Vasomotor rhinitis    exacerbates VCD   Vocal cord dysfunction    proven on FOB 9/09     Past Surgical History:  Procedure Laterality Date   ABDOMINAL HYSTERECTOMY  1980   ABDOMINAL HYSTERECTOMY     BASAL CELL CARCINOMA EXCISION  11/08   BIOPSY  12/18/2022   Procedure: BIOPSY;  Surgeon: Iva Boop, MD;  Location: [facility];  Service: Gastroenterology;;   CARDIAC CATHETERIZATION N/A 05/15/2015   Procedure: Left Heart Cath and Coronary Angiography;  Surgeon: Tonny Bollman, MD;  Location: [facility] INVASIVE CV LAB;  Service: Cardiovascular;  Laterality: N/A;   COLONOSCOPY N/A 04/27/2017   Dr Lavon Paganini for scant rectal bleeding and iron def anemia: Melanosis coli, lipomatous IC valve, left sided tics, non-bleeding hemorrhoid, 10 mm transverse polyp.     COLONOSCOPY WITH PROPOFOL N/A 12/18/2022   Procedure: COLONOSCOPY WITH PROPOFOL;  Surgeon: Iva Boop, MD;  Location:  [facility];  Service: Gastroenterology;  Laterality: N/A;   ESOPHAGOGASTRODUODENOSCOPY N/A 04/27/2017   Napoleon Form, MD; [facility] ENDOSCOPY. for iron def anemia.  Monilial/candidial esophagitis. Small HH.     ESOPHAGOGASTRODUODENOSCOPY (EGD) WITH PROPOFOL N/A 12/18/2022   Procedure: ESOPHAGOGASTRODUODENOSCOPY (EGD) WITH PROPOFOL;  Surgeon: Iva Boop, MD;  Location: [facility] ENDOSCOPY;  Service: Gastroenterology;  Laterality: N/A;   EYE SURGERY     POLYPECTOMY  12/18/2022   Procedure: POLYPECTOMY;  Surgeon: Iva Boop, MD;  Location: [facility]  ENDOSCOPY;  Service: Gastroenterology;;   scar tissue removal  1982   THYROID SURGERY  2010   THYROIDECTOMY  11-05-08     Current Outpatient Medications:    albuterol (VENTOLIN HFA) 108 (90 Base) MCG/ACT inhaler, INHALE 2 PUFFS INTO THE LUNGS EVERY 6 HOURS AS NEEDED FOR WHEEZING OR SHORTNESS OF BREATH, Disp: 6.7 g, Rfl: 1   ALPRAZolam (XANAX) 0.25 MG tablet, Take 1 tablet (0.25 mg total) by mouth 2 (two) times daily as needed for anxiety., Disp: 30 tablet, Rfl: 0   amitriptyline (ELAVIL) 50 MG tablet, Take one tablet at bedtime, Disp: 90 tablet, Rfl: 1   atorvastatin (LIPITOR) 20 MG tablet, TAKE 1 TABLET(20 MG) BY MOUTH DAILY, Disp: 90 tablet, Rfl: 1   benzonatate (TESSALON) 200 MG capsule, Take 1 capsule (200 mg total) by mouth 3 (three) times daily as needed for cough., Disp: 30 capsule, Rfl: 0   Budesonide (PULMICORT FLEXHALER) 90 MCG/ACT inhaler, Inhale 2 puffs into the lungs 2 (two) times daily., Disp: 1 each, Rfl: 3   calcium carbonate (TUMS - DOSED IN MG ELEMENTAL CALCIUM) 500 MG chewable tablet, Chew 1-2 tablets by mouth 2 (two) times daily as needed for indigestion or heartburn., Disp: , Rfl:    digoxin (LANOXIN) 0.125 MG tablet, Take 1 tablet (0.125 mg total) by mouth daily., Disp: 90 tablet, Rfl: 3   diphenhydramine-acetaminophen (TYLENOL PM) 25-500 MG TABS tablet, Take 1 tablet by mouth at bedtime as needed (pain)., Disp: , Rfl:    famotidine (PEPCID) 40 MG tablet, Take 40 mg by mouth daily as needed for heartburn or indigestion., Disp: , Rfl:    ferrous sulfate 325 (65 FE) MG EC tablet, Take 1 tablet (325 mg total) by mouth daily with breakfast., Disp: , Rfl:    fludrocortisone (FLORINEF) 0.1 MG tablet, TAKE 1 TABLET BY MOUTH DAILY, Disp: 90 tablet, Rfl: 1   furosemide (LASIX) 20 MG tablet, Take 1 tablet (20 mg total) by mouth daily as needed for edema., Disp: 90 tablet, Rfl: 3   hydrocortisone (CORTEF) 10 MG tablet, Take 10 mg by mouth 2 (two) times daily. Patient reports taking BID  09/07/2022, Disp: , Rfl:    hydroxypropyl methylcellulose / hypromellose (ISOPTO TEARS / GONIOVISC) 2.5 % ophthalmic solution, Place 2 drops into both eyes daily as needed for dry eyes., Disp: , Rfl:    levothyroxine (SYNTHROID) 25 MCG tablet, Take 25 mcg by mouth every morning., Disp: , Rfl:    pantoprazole (PROTONIX) 40 MG tablet, Take 1 tablet (40 mg total) by mouth daily., Disp: 30 tablet, Rfl: 3   polyethylene glycol (MIRALAX) 17 g packet, Take 17 g by mouth daily as needed for mild constipation., Disp: , Rfl: 0   potassium chloride SA (KLOR-CON M) 20 MEQ tablet, Take 2 tablets (40 mEq total) by mouth daily., Disp: 270 tablet, Rfl: 3   sertraline (ZOLOFT) 25 MG tablet, Take 1 tablet (25 mg total) by mouth daily., Disp: 30 tablet, Rfl: 3   magic  mouthwash w/lidocaine SOLN, Take 5 mLs by mouth 4 (four) times daily as needed for mouth pain. (Patient not taking: Reported on 01/31/2023), Disp: 120 mL, Rfl: 0:     Allergies  Allergen Reactions   Fenofibrate Other (See Comments)    GI side effects    Erythromycin Nausea And Vomiting    REACTION: nausea and vomiting   Guaifenesin Er Palpitations   Sulfonamide Derivatives Nausea And Vomiting    REACTION: nausea and vomiting    FH: No family history of anemia, blood disorder, cancer.  SOCIAL HISTORY: She lives in [place].  She is married.  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  No tobacco use.  Very rare EtOH intake.  Review of Systems: No change in baseline appetite.  She has lost a few pounds.  No bleeding.  No bloody or black stools.  No hematuria.  No change in baseline bowel habits 2-3 bowel movements per day.  No fevers or sweats.  No dysphagia.  Periodic nausea/vomiting, not on a regular basis.  She has been short of breath with exertion for the past 6 months.  She notes inhalers help.  Few week history of a cough which is improving.  No numbness or tingling in the hands or feet.   Physical  Exam:  Blood pressure 137/77, pulse 88, temperature 98.9 F (37.2 C), temperature source Oral, resp. rate 18, height 5\' 3"  (1.6 m), weight 97 lb 6.4 oz (44.2 kg), SpO2 100 %.  HEENT: No thrush or ulcers.  Conjunctival pallor. Lungs: Lungs clear bilaterally. Cardiac: Regular rate and rhythm. Abdomen: No hepatosplenomegaly.  No mass.  DRE attempted, anal stricture could not be passed due to patient discomfort. Vascular: No leg edema. Lymph nodes: No palpable cervical, supraclavicular, axillary or inguinal lymph nodes. Neurologic: Alert and oriented. Skin: No rash.  LABS:   Recent Labs    01/31/23 1116  WBC 12.1*  HGB 9.8*  HCT 34.1*  PLT 556*  Peripheral blood smear-microcytes, ovalocytes, few teardrops, rare cigar cells, polychromasia mildly increased; few 5 lobed neutrophils; platelets appear increased in number.  No results for input(s): "NA", "K", "CL", "CO2", "GLUCOSE", "BUN", "CREATININE", "CALCIUM" in the last 72 hours.    RADIOLOGY:  No results found.  Assessment and Plan:   Iron deficiency anemia 12/16/2022 hemoglobin 5.6, MCV 68, ferritin 3 12/17/2022 2 units red blood cells 12/17/2022 ferrous gluconate 125 mg IV 12/18/2022 upper endoscopy-esophageal plaques were found secondary to candidiasis, examination otherwise normal 12/18/2022 colonoscopy-post polypectomy scar at the ileocecal valve, melanosis in the colon, anal stricture and nonthrombosed external hemorrhoids found on digital rectal exam, 2 polyps in the distal transverse colon and distal ascending colon.  Tubular adenoma Hospitalization with severe microcytic anemia 12/16/2022 Hospitalization September 2018 with severe microcytic anemia 04/25/2017 hemoglobin 5.0, MCV 53, ferritin 2 04/26/2017 stool positive for occult blood Upper endoscopy 04/27/2017-mild Candida esophagitis Colonoscopy 04/27/2017-melanosis, lipomatous ileocecal valve-tubular adenoma, 10 mm polyp at the transverse colon-tubular adenoma Capsule  endoscopy 05/10/2018-single linear erosion at 1 hour 32 minutes, otherwise negative Colonoscopy 06/01/2018-polyps removed from the ileocecal valve and ascending colon Esophageal candidiasis 12/18/2022, completed treatment with fluconazole Addison's disease CKD CAD  Ms. Sparkman has recurrent iron deficiency anemia.  Source of blood loss has not been identified, upper endoscopy and colonoscopy were completed recently.  Capsule endoscopy could be considered.  She will complete stool cards and submit a urine specimen.  She will increase oral iron to 3 times daily.  She understands to contact the office if  she develops constipation.  She will return for lab and follow-up in 4 weeks.  We are available to see her sooner if needed.  We specifically discussed signs/symptoms suggestive of progressive anemia.  Patient seen with Dr. Truett Perna.    Lonna Cobb, NP 01/31/2023, 12:20 PM  This was a shared visit with Maisie Fus.  Ms. Mariconda was interviewed and examined.  I reviewed the peripheral blood smear.  She is referred for evaluation of anemia.  The hematologic indices and blood smear are consistent with iron deficiency.  Ferritin is in the low normal range.  We adjusted the oral iron regimen today.  She will return stool Hemoccult cards. We suspect the iron deficiency anemia is related to chronic GI blood loss.  I was present for greater than 50% of today's visit.  I performed medical decision making.  Mancel Bale, MD

## 2023-01-31 ENCOUNTER — Inpatient Hospital Stay: Payer: Medicare Other | Attending: Nurse Practitioner | Admitting: Nurse Practitioner

## 2023-01-31 ENCOUNTER — Telehealth: Payer: Self-pay | Admitting: Nurse Practitioner

## 2023-01-31 ENCOUNTER — Inpatient Hospital Stay: Payer: Medicare Other

## 2023-01-31 ENCOUNTER — Encounter: Payer: Self-pay | Admitting: Nurse Practitioner

## 2023-01-31 VITALS — BP 137/77 | HR 88 | Temp 98.9°F | Resp 18 | Ht 63.0 in | Wt 97.4 lb

## 2023-01-31 DIAGNOSIS — Z79899 Other long term (current) drug therapy: Secondary | ICD-10-CM | POA: Insufficient documentation

## 2023-01-31 DIAGNOSIS — D509 Iron deficiency anemia, unspecified: Secondary | ICD-10-CM

## 2023-01-31 LAB — CBC WITH DIFFERENTIAL (CANCER CENTER ONLY)
Abs Immature Granulocytes: 0.05 10*3/uL (ref 0.00–0.07)
Basophils Absolute: 0.1 10*3/uL (ref 0.0–0.1)
Basophils Relative: 1 %
Eosinophils Absolute: 0.1 10*3/uL (ref 0.0–0.5)
Eosinophils Relative: 1 %
HCT: 34.1 % — ABNORMAL LOW (ref 36.0–46.0)
Hemoglobin: 9.8 g/dL — ABNORMAL LOW (ref 12.0–15.0)
Immature Granulocytes: 0 %
Lymphocytes Relative: 14 %
Lymphs Abs: 1.7 10*3/uL (ref 0.7–4.0)
MCH: 21.5 pg — ABNORMAL LOW (ref 26.0–34.0)
MCHC: 28.7 g/dL — ABNORMAL LOW (ref 30.0–36.0)
MCV: 74.8 fL — ABNORMAL LOW (ref 80.0–100.0)
Monocytes Absolute: 0.6 10*3/uL (ref 0.1–1.0)
Monocytes Relative: 5 %
Neutro Abs: 9.6 10*3/uL — ABNORMAL HIGH (ref 1.7–7.7)
Neutrophils Relative %: 79 %
Platelet Count: 556 10*3/uL — ABNORMAL HIGH (ref 150–400)
RBC: 4.56 MIL/uL (ref 3.87–5.11)
RDW: 24 % — ABNORMAL HIGH (ref 11.5–15.5)
WBC Count: 12.1 10*3/uL — ABNORMAL HIGH (ref 4.0–10.5)
nRBC: 0 % (ref 0.0–0.2)

## 2023-01-31 LAB — SAVE SMEAR(SSMR), FOR PROVIDER SLIDE REVIEW

## 2023-01-31 LAB — FERRITIN: Ferritin: 25 ng/mL (ref 11–307)

## 2023-02-01 ENCOUNTER — Encounter: Payer: Self-pay | Admitting: Internal Medicine

## 2023-02-01 ENCOUNTER — Ambulatory Visit (INDEPENDENT_AMBULATORY_CARE_PROVIDER_SITE_OTHER): Payer: Medicare Other | Admitting: Internal Medicine

## 2023-02-01 VITALS — BP 118/64 | HR 86 | Temp 97.0°F | Ht 63.0 in | Wt 97.2 lb

## 2023-02-01 DIAGNOSIS — J4530 Mild persistent asthma, uncomplicated: Secondary | ICD-10-CM

## 2023-02-01 DIAGNOSIS — R12 Heartburn: Secondary | ICD-10-CM

## 2023-02-01 DIAGNOSIS — J302 Other seasonal allergic rhinitis: Secondary | ICD-10-CM | POA: Diagnosis not present

## 2023-02-01 DIAGNOSIS — R053 Chronic cough: Secondary | ICD-10-CM | POA: Diagnosis not present

## 2023-02-01 DIAGNOSIS — K219 Gastro-esophageal reflux disease without esophagitis: Secondary | ICD-10-CM | POA: Diagnosis not present

## 2023-02-01 DIAGNOSIS — K2100 Gastro-esophageal reflux disease with esophagitis, without bleeding: Secondary | ICD-10-CM

## 2023-02-01 MED ORDER — PANTOPRAZOLE SODIUM 40 MG PO TBEC: 40.0000 mg | DELAYED_RELEASE_TABLET | Freq: Two times a day (BID) | ORAL | 3 refills | Status: DC

## 2023-02-01 MED ORDER — LEVOCETIRIZINE DIHYDROCHLORIDE 5 MG PO TABS: 5.0000 mg | ORAL_TABLET | Freq: Every evening | ORAL | 3 refills | Status: DC

## 2023-02-01 NOTE — Progress Notes (Signed)
Kaitlyn Adams    [id number]    03-20-1951  Primary Care Physician:Tabori, Helane Rima, MD  Referring Physician: Sheliah Hatch, MD [address] [place],  [place] [address] Reason for Consultation: cough Date of Consultation: 02/01/2023  Chief complaint:   Chief Complaint  Patient presents with   Consult    Slight cough  CXR 12/16/22     HPI: Kaitlyn Adams is a 72 y.o. woman with a past medical history of iron deficiency anemia and addison's disease who presents for new patient evaluation for cough and shortness of breath.   She has had chronic cough on and on for the past several years. Dyspnea is also chronic but worsened in the last 6 months.   Dyspnea is with exertion but also at rest. She is unable to do house work due to breathing and weakness from anemia and addison's. Has chest tightness and wheezing.   Cough is mostly dry. No blood. Has taken cough drops and delsym cough syrup. Worse at night time, wakes her up from sleep  Given pulmicort inhaler 2 puffs twice a day and this seems to be helping. She also has albuterol rescue inhaler but use is minimal less than once/week.   She does have recurrent bronchitis.  She had recurrent bronchitis as a teenager as well. Her breathing and cough do improve with prednisone.   She does have seasonal environmental allergies.  She has reflux on tums and protonix 40 mg once daily.   Social history:  Occupation: owned a Museum/gallery curator . Exposures: lives at home with husband and dog.  Smoking history: never smoker passive smoke exposure in husband (who quit in 2004.)  Social History   Occupational History   Occupation: owns a Museum/gallery curator   Occupation: OWNER    Employer: [employer]  Tobacco Use   Smoking status: Never   Smokeless tobacco: Never  Vaping Use   Vaping Use: Never used  Substance and Sexual Activity   Alcohol use: Yes    Alcohol/week: 1.0 standard drink of  alcohol    Types: 1 Glasses of wine per week    Comment: occasional wine or beer   Drug use: No   Sexual activity: Yes    Birth control/protection: None    Relevant family history:  Family History  Problem Relation Age of Onset   Asthma Mother    Heart disease Mother    Emphysema Father    Heart disease Father    Irregular heart beat Brother    Asthma Grandchild     Past Medical History:  Diagnosis Date   Addison disease (HCC)    Anemia    Asthma    - normal spirometry 2009 FEV1  >90% predicted.  - methacoline challenge neg 11/09   Bronchitis    recurrent   Cataract    Chronic cough    - sinus CT 03-23-10 > neg.  - allergy profile 03-23-10 > nl, IgE 14.  - flutter valve rx 03-23-10   Fibromyalgia    GERD (gastroesophageal reflux disease)    exacerbating VCD   Glaucoma    History of blood transfusion    Hyperlipidemia    Hypertension    Hyperthyroidism    with hot nodule.  - total thyroidectomy 11-05-08 benign -> subsequent hypothyroidism.   Hypokalemia    Hypothyroidism    Hypothyroidism    a. following thyroidectomy.   Mild CAD  a. LHC 04/2015 - mild nonobstructive CAD with 30% dLAD, 40% D1, 25% pLCx, 30% mRCA, 30% RPDA, normal EF >65% with normal LVEDP.   Orthostatic hypotension    Paroxysmal atrial fibrillation (HCC)    Renal insufficiency    Vasomotor rhinitis    exacerbates VCD   Vocal cord dysfunction    proven on FOB 9/09    Past Surgical History:  Procedure Laterality Date   ABDOMINAL HYSTERECTOMY  1980   ABDOMINAL HYSTERECTOMY     BASAL CELL CARCINOMA EXCISION  11/08   BIOPSY  12/18/2022   Procedure: BIOPSY;  Surgeon: Iva Boop, MD;  Location: [facility];  Service: Gastroenterology;;   CARDIAC CATHETERIZATION N/A 05/15/2015   Procedure: Left Heart Cath and Coronary Angiography;  Surgeon: Tonny Bollman, MD;  Location: [facility] INVASIVE CV LAB;  Service: Cardiovascular;  Laterality: N/A;   COLONOSCOPY N/A 04/27/2017   Dr Lavon Paganini for scant rectal  bleeding and iron def anemia: Melanosis coli, lipomatous IC valve, left sided tics, non-bleeding hemorrhoid, 10 mm transverse polyp.     COLONOSCOPY WITH PROPOFOL N/A 12/18/2022   Procedure: COLONOSCOPY WITH PROPOFOL;  Surgeon: Iva Boop, MD;  Location: [facility];  Service: Gastroenterology;  Laterality: N/A;   ESOPHAGOGASTRODUODENOSCOPY N/A 04/27/2017   Napoleon Form, MD; [facility] ENDOSCOPY. for iron def anemia.  Monilial/candidial esophagitis. Small HH.     ESOPHAGOGASTRODUODENOSCOPY (EGD) WITH PROPOFOL N/A 12/18/2022   Procedure: ESOPHAGOGASTRODUODENOSCOPY (EGD) WITH PROPOFOL;  Surgeon: Iva Boop, MD;  Location: [facility];  Service: Gastroenterology;  Laterality: N/A;   EYE SURGERY     POLYPECTOMY  12/18/2022   Procedure: POLYPECTOMY;  Surgeon: Iva Boop, MD;  Location: [facility];  Service: Gastroenterology;;   scar tissue removal  1982   THYROID SURGERY  2010   THYROIDECTOMY  11-05-08     Physical Exam: Blood pressure 118/64, pulse 86, temperature (!) 97 F (36.1 C), temperature source Temporal, height 5\' 3"  (1.6 m), weight 97 lb 3.2 oz (44.1 kg), SpO2 100 %. Gen:      No acute distress, thin, frail ENT:  +cobblestoning in oropharynx, no nasal polyps, mucus membranes moist, no thrush Lungs:    No increased respiratory effort, symmetric chest wall excursion, clear to auscultation bilaterally, no wheezes or crackles CV:         Regular rate and rhythm; no murmurs, rubs, or gallops.  No pedal edema Abd:      + bowel sounds; soft, non-tender; no distension MSK: no acute synovitis of DIP or PIP joints, no mechanics hands.  Skin:      Warm and dry; no rashes Neuro: normal speech, no focal facial asymmetry Psych: alert and oriented x3, normal mood and affect   Data Reviewed/Medical Decision Making:  Independent interpretation of tests: Imaging:  Review of patient's chest xray images April 2024 revealed no acute process. The patient's images have been independently  reviewed by me.    PFTs:   Labs:  Lab Results  Component Value Date   NA 140 12/18/2022   K 2.3 (LL) 12/18/2022   CO2 12 (L) 12/18/2022   GLUCOSE 76 12/18/2022   BUN 10 12/18/2022   CREATININE 1.48 (H) 12/18/2022   CALCIUM 8.2 (L) 12/18/2022   GFR 31.26 (L) 06/21/2022   GFRNONAA 37 (L) 12/18/2022   Lab Results  Component Value Date   WBC 12.1 (H) 01/31/2023   HGB 9.8 (L) 01/31/2023   HCT 34.1 (L) 01/31/2023   MCV 74.8 (L) 01/31/2023   PLT 556 (  H) 01/31/2023     Immunization status:  Immunization History  Administered Date(s) Administered   Fluad Quad(high Dose 65+) 10/02/2019   Influenza Split 09/11/2012   Influenza Whole 06/22/2009   Influenza, High Dose Seasonal PF 07/06/2017, 08/01/2018   Influenza-Unspecified 07/07/2021   PFIZER(Purple Top)SARS-COV-2 Vaccination 10/18/2019, 11/13/2019, 05/28/2020   Pneumococcal Conjugate-13 11/18/2015   Pneumococcal Polysaccharide-23 08/23/2003, 07/06/2017   Tdap 07/10/2012     I reviewed prior external note(s) from pcp. Hospital stay, gastroenterology  I reviewed the result(s) of the labs and imaging as noted above.   I have ordered feno  Assessment:  Chronic Cough - likely GERD with seasonal environmental allergies Mild persistent asthma  Plan/Recommendations: I think the cough is likely multifactorial from post nasal drainage from allergies, and from the reflux. Let's go up to twice daily on the protonix. Please make an appointment with Dr. Leone Payor - you saw him in the hospital he did your endoscopy. I have given you some helpful advice about how to modify your diet. The pepsi is likely making the reflux worse so would cut back on that.   Also start taking an allergy pill once daily to see if this helps. I have sent xyzal to your pharmacy.   Shortness of breath is likely asthma.  Continue the pulmicort 2 puffs twice a day. Gargle after use to prevent thrush.  Take the albuterol rescue inhaler every 4 to 6 hours as  needed for wheezing or shortness of breath.  She wasn't able to do the maneuver for a feno so I doubt getting additional pft at this time would be something she could do.   We discussed disease management and progression at length today regarding asthma and reflux.   Return to Care: Return in about 2 months (around 04/03/2023).  Durel Salts, MD Pulmonary and Critical Care Medicine [facility] Office:[phone]  CC: Sheliah Hatch, MD

## 2023-02-01 NOTE — Patient Instructions (Addendum)
Please schedule follow up scheduled with myself in 2 months.  If my schedule is not open yet, we will contact you with a reminder closer to that time. Please call [phone] if you haven't heard from [place] a month before.   I think the cough is likely multifactorial from post nasal drainage from allergies, and from the reflux. Let's go up to twice daily on the protonix. Please make an appointment with Dr. Leone Payor - you saw him in the hospital he did your endoscopy. I have given you some helpful advice about how to modify your diet. The pepsi is likely making the reflux worse so would cut back on that.   Also start taking an allergy pill once daily to see if this helps. I have sent xyzal to your pharmacy.   Shortness of breath is likely asthma.  Continue the pulmicort 2 puffs twice a day. Gargle after use to prevent thrush.  Take the albuterol rescue inhaler every 4 to 6 hours as needed for wheezing or shortness of breath. You can also take it 15 minutes before exercise or exertional activity. Side effects include heart racing or pounding, jitters or anxiety. If you have a history of an irregular heart rhythm, it can make this worse. Can also give some patients a hard time sleeping.   What is GERD? Gastroesophageal reflux disease (GERD) is gastroesophageal reflux diseasewhich occurs when the lower esophageal sphincter (LES) opens spontaneously, for varying periods of time, or does not close properly and stomach contents rise up into the esophagus. GER is also called acid reflux or acid regurgitation, because digestive juices--called acids--rise up with the food. The esophagus is the tube that carries food from the mouth to the stomach. The LES is a ring of muscle at the bottom of the esophagus that acts like a valve between the esophagus and stomach.  When acid reflux occurs, food or fluid can be tasted in the back of the mouth. When refluxed stomach acid touches the lining of the esophagus it may  cause a burning sensation in the chest or throat called heartburn or acid indigestion. Occasional reflux is common. Persistent reflux that occurs more than twice a week is considered GERD, and it can eventually lead to more serious health problems. People of all ages can have GERD. Studies have shown that GERD may worsen or contribute to asthma, chronic cough, and pulmonary fibrosis.   What are the symptoms of GERD? The main symptom of GERD in adults is frequent heartburn, also called acid indigestion--burning-type pain in the lower part of the mid-chest, behind the breast bone, and in the mid-abdomen.  Not all reflux is acidic in nature, and many patients don't have heart burn at all. Sometimes it feels like a cough (either dry or with mucus), choking sensation, asthma, shortness of breath, waking up at night, frequent throat clearing, or trouble swallowing.    What causes GERD? The reason some people develop GERD is still unclear. However, research shows that in people with GERD, the LES relaxes while the rest of the esophagus is working. Anatomical abnormalities such as a hiatal hernia may also contribute to GERD. A hiatal hernia occurs when the upper part of the stomach and the LES move above the diaphragm, the muscle wall that separates the stomach from the chest. Normally, the diaphragm helps the LES keep acid from rising up into the esophagus. When a hiatal hernia is present, acid reflux can occur more easily. A hiatal hernia can occur in  people of any age and is most often a normal finding in otherwise healthy people over age 35. Most of the time, a hiatal hernia produces no symptoms.   Other factors that may contribute to GERD include - Obesity or recent weight gain - Pregnancy  - Smoking  - Diet - Certain medications  Common foods that can worsen reflux symptoms include: - carbonated beverages - artificial sweeteners - citrus fruits  - chocolate  - drinks with caffeine or alcohol  -  fatty and fried foods  - garlic and onions  - mint flavorings  - spicy foods  - tomato-based foods, like spaghetti sauce, salsa, chili, and pizza   Lifestyle Changes If you smoke, stop.  Avoid foods and beverages that worsen symptoms (see above.) Lose weight if needed.  Eat small, frequent meals.  Wear loose-fitting clothes.  Avoid lying down for 3 hours after a meal.  Raise the head of your bed 6 to 8 inches by securing wood blocks under the bedposts. Just using extra pillows will not help, but using a wedge-shaped pillow may be helpful.  Medications  H2 blockers, such as cimetidine (Tagamet HB), famotidine (Pepcid AC), nizatidine (Axid AR), and ranitidine (Zantac 75), decrease acid production. They are available in prescription strength and over-the-counter strength. These drugs provide short-term relief and are effective for about half of those who have GERD symptoms.  Proton pump inhibitors include omeprazole (Prilosec, Zegerid), lansoprazole (Prevacid), pantoprazole (Protonix), rabeprazole (Aciphex), and esomeprazole (Nexium), which are available by prescription. Prilosec is also available in over-the-counter strength. Proton pump inhibitors are more effective than H2 blockers and can relieve symptoms and heal the esophageal lining in almost everyone who has GERD.  Because drugs work in different ways, combinations of medications may help control symptoms. People who get heartburn after eating may take both antacids and H2 blockers. The antacids work first to neutralize the acid in the stomach, and then the H2 blockers act on acid production. By the time the antacid stops working, the H2 blocker will have stopped acid production. Your health care provider is the best source of information about how to use medications for GERD.   Points to Remember 1. You can have GERD without having heartburn. Your symptoms could include a dry cough, asthma symptoms, or trouble swallowing.  2. Taking  medications daily as prescribed is important in controlling you symptoms.  Sometimes it can take up to 8 weeks to fully achieve the effects of the medications prescribed.  3. Coughing related to GERD can be difficult to treat and is very frustrating!  However, it is important to stick with these medications and lifestyle modifications before pursuing more aggressive or invasive test and treatments.

## 2023-02-02 ENCOUNTER — Other Ambulatory Visit: Payer: Self-pay | Admitting: Family Medicine

## 2023-02-03 ENCOUNTER — Telehealth: Payer: Self-pay

## 2023-02-03 ENCOUNTER — Inpatient Hospital Stay: Payer: Medicare Other

## 2023-02-03 ENCOUNTER — Other Ambulatory Visit: Payer: Self-pay | Admitting: Nurse Practitioner

## 2023-02-03 DIAGNOSIS — R8290 Unspecified abnormal findings in urine: Secondary | ICD-10-CM

## 2023-02-03 DIAGNOSIS — D509 Iron deficiency anemia, unspecified: Secondary | ICD-10-CM

## 2023-02-03 DIAGNOSIS — Z79899 Other long term (current) drug therapy: Secondary | ICD-10-CM | POA: Diagnosis not present

## 2023-02-03 LAB — URINALYSIS, COMPLETE (UACMP) WITH MICROSCOPIC
Bilirubin Urine: NEGATIVE
Glucose, UA: NEGATIVE mg/dL
Hgb urine dipstick: NEGATIVE
Ketones, ur: NEGATIVE mg/dL
Nitrite: NEGATIVE
Specific Gravity, Urine: 1.014 (ref 1.005–1.030)
pH: 5 (ref 5.0–8.0)

## 2023-02-03 LAB — OCCULT BLOOD X 1 CARD TO LAB, STOOL
Fecal Occult Bld: POSITIVE — AB
Fecal Occult Bld: POSITIVE — AB
Fecal Occult Bld: POSITIVE — AB

## 2023-02-03 NOTE — Telephone Encounter (Signed)
The lab was add on

## 2023-02-03 NOTE — Telephone Encounter (Signed)
-----   Message from Rana Snare, NP sent at 02/03/2023  3:20 PM EDT ----- Please ask lab to add a urine culture today.

## 2023-02-05 LAB — URINE CULTURE: Culture: >=100000 — AB

## 2023-02-07 ENCOUNTER — Encounter: Payer: Self-pay | Admitting: Family Medicine

## 2023-02-07 ENCOUNTER — Telehealth: Payer: Self-pay | Admitting: Internal Medicine

## 2023-02-07 ENCOUNTER — Ambulatory Visit (INDEPENDENT_AMBULATORY_CARE_PROVIDER_SITE_OTHER): Payer: Medicare Other | Admitting: Family Medicine

## 2023-02-07 ENCOUNTER — Telehealth: Payer: Self-pay

## 2023-02-07 ENCOUNTER — Other Ambulatory Visit: Payer: Self-pay | Admitting: Family Medicine

## 2023-02-07 VITALS — BP 102/68 | HR 87 | Temp 97.9°F | Resp 17 | Ht 63.0 in | Wt 99.1 lb

## 2023-02-07 DIAGNOSIS — N390 Urinary tract infection, site not specified: Secondary | ICD-10-CM

## 2023-02-07 DIAGNOSIS — E785 Hyperlipidemia, unspecified: Secondary | ICD-10-CM | POA: Diagnosis not present

## 2023-02-07 DIAGNOSIS — R296 Repeated falls: Secondary | ICD-10-CM | POA: Diagnosis not present

## 2023-02-07 DIAGNOSIS — E440 Moderate protein-calorie malnutrition: Secondary | ICD-10-CM

## 2023-02-07 DIAGNOSIS — R29898 Other symptoms and signs involving the musculoskeletal system: Secondary | ICD-10-CM

## 2023-02-07 LAB — HEPATIC FUNCTION PANEL
ALT: 12 U/L (ref 0–35)
AST: 19 U/L (ref 0–37)
Albumin: 3.5 g/dL (ref 3.5–5.2)
Alkaline Phosphatase: 98 U/L (ref 39–117)
Bilirubin, Direct: 0 mg/dL (ref 0.0–0.3)
Total Bilirubin: 0.4 mg/dL (ref 0.2–1.2)
Total Protein: 8.9 g/dL — ABNORMAL HIGH (ref 6.0–8.3)

## 2023-02-07 LAB — LIPID PANEL
Cholesterol: 124 mg/dL (ref 0–200)
HDL: 45.6 mg/dL (ref >39.00)
LDL Cholesterol: 54 mg/dL (ref 0–99)
NonHDL: 78.83
Total CHOL/HDL Ratio: 3
Triglycerides: 123 mg/dL (ref 0.0–149.0)
VLDL: 24.6 mg/dL (ref 0.0–40.0)

## 2023-02-07 LAB — BASIC METABOLIC PANEL
BUN: 12 mg/dL (ref 6–23)
CO2: 23 mEq/L (ref 19–32)
Calcium: 9.6 mg/dL (ref 8.4–10.5)
Chloride: 104 mEq/L (ref 96–112)
Creatinine, Ser: 1.23 mg/dL — ABNORMAL HIGH (ref 0.40–1.20)
GFR: 43.63 mL/min — ABNORMAL LOW (ref >60.00)
Glucose, Bld: 78 mg/dL (ref 70–99)
Potassium: 2.8 mEq/L — CL (ref 3.5–5.1)
Sodium: 136 mEq/L (ref 135–145)

## 2023-02-07 MED ORDER — CEPHALEXIN 500 MG PO CAPS: 500.0000 mg | ORAL_CAPSULE | Freq: Two times a day (BID) | ORAL | 0 refills | Status: AC

## 2023-02-07 MED ORDER — NAPROXEN 500 MG PO TABS: 500.0000 mg | ORAL_TABLET | Freq: Two times a day (BID) | ORAL | 0 refills | Status: DC

## 2023-02-07 NOTE — Telephone Encounter (Signed)
Request to consider capsule endoscopy, negative EGD and colonoscopy overall though had anorectal stricture and hemorrhoids which could have caused heme positive stool.  Had negative capsule 2018  I will call patient and discuss

## 2023-02-07 NOTE — Assessment & Plan Note (Signed)
Pt has hx of frequent falls.  She reports bilateral leg weakness.  She has a walker at home but did not use it to come here today.  She no longer drives and husband has to accompany her to appts.  She was pleased that she hasn't had an extremely recent fall but admits that she is weak and unsteady on her feet.  Will order [facility] PT to work on strength, balance, and endurance.  Pt expressed understanding and is in agreement w/ plan.

## 2023-02-07 NOTE — Progress Notes (Signed)
   Subjective:    Patient ID: Kaitlyn Adams, female    DOB: March 05, 1951, 72 y.o.   MRN: [id number]  HPI Hyperlipidemia- chronic problem, on Lipitor 20mg .  No CP, SOB is improving.  No abd pain, N/V.  Protein Calorie Malnutrition- pt has lost 4 lbs since 12/23/22.  Pt and husband report she is eating regularly- pt reports toast for breakfast, a meal at 2-3pm, and then a snack later in the day.  Pt is not doing any protein shakes or supplements.  Husband reports they are available but not being consumed.    Frequent falls- pt has not had a recent wall but continues to struggle w/ balance and strength.    UTI- pt has >100,000 colonies of Klebsiella.  Sensitivities show susceptible to Keflex   Review of Systems For ROS see HPI     Objective:   Physical Exam Vitals reviewed.  Constitutional:      General: She is not in acute distress.    Appearance: Normal appearance. She is well-developed. She is ill-appearing (frail).  HENT:     Head: Normocephalic and atraumatic.  Eyes:     Conjunctiva/sclera: Conjunctivae normal.     Pupils: Pupils are equal, round, and reactive to light.  Neck:     Thyroid: No thyromegaly.  Cardiovascular:     Rate and Rhythm: Normal rate and regular rhythm.     Pulses: Normal pulses.     Heart sounds: Normal heart sounds. No murmur heard. Pulmonary:     Effort: Pulmonary effort is normal. No respiratory distress.     Breath sounds: Normal breath sounds.  Abdominal:     General: There is no distension.     Palpations: Abdomen is soft.     Tenderness: There is no abdominal tenderness.  Musculoskeletal:     Cervical back: Normal range of motion and neck supple.     Right lower leg: No edema.     Left lower leg: No edema.  Lymphadenopathy:     Cervical: No cervical adenopathy.  Skin:    General: Skin is warm and dry.  Neurological:     Mental Status: She is alert and oriented to person, place, and time.  Psychiatric:        Mood and Affect: Mood  normal.        Behavior: Behavior normal.           Assessment & Plan:   UTI- reviewed culture results that were sent on Friday.  Pt w/ Klebsiella that is sensitive to Keflex.  Prescription sent.

## 2023-02-07 NOTE — Assessment & Plan Note (Signed)
Chronic problem.  Currently on Lipitor 20mg daily w/o difficulty.  Check labs.  Adjust meds prn  

## 2023-02-07 NOTE — Patient Instructions (Signed)
Follow up in 6 months to recheck cholesterol We'll notify you of your lab results and make any changes if needed START the Cephalexin twice daily as directed for the UTI Make sure you are eating protein at every mean- multiple times a day Please add at least 1 protein shake daily Home Health will call you to get things set up Call with any questions or concerns Stay Safe!  Stay Healthy! Have a great summer!!

## 2023-02-07 NOTE — Telephone Encounter (Signed)
-----   Message from Sheliah Hatch, MD sent at 02/07/2023  2:00 PM EDT ----- Labs look great w/ exception of low potassium.  It is actually better than last check, but still low.  Please make sure you are taking the potassium 2 tabs 3x every day.  If you're already taking 2 pills 3x/day we will need to adjust your dosage

## 2023-02-07 NOTE — Telephone Encounter (Signed)
Addressed via Result Note 

## 2023-02-07 NOTE — Telephone Encounter (Signed)
[name] called and notes patient has a critical potassium of 2.8   Kaitlyn Adams call back

## 2023-02-07 NOTE — Assessment & Plan Note (Signed)
Ongoing issue for pt. She is down 4 lbs since 5/3.  Husband reports she is eating regularly but based on her recall, she is protein deficient.  Encouraged daily protein shake such as Boost or Ensure.  Husband says they're available but she doesn't drink them.  Will continue to follow.

## 2023-02-08 DIAGNOSIS — B961 Klebsiella pneumoniae [K. pneumoniae] as the cause of diseases classified elsewhere: Secondary | ICD-10-CM | POA: Diagnosis not present

## 2023-02-08 DIAGNOSIS — Z9181 History of falling: Secondary | ICD-10-CM | POA: Diagnosis not present

## 2023-02-08 DIAGNOSIS — E440 Moderate protein-calorie malnutrition: Secondary | ICD-10-CM | POA: Diagnosis not present

## 2023-02-08 DIAGNOSIS — N390 Urinary tract infection, site not specified: Secondary | ICD-10-CM | POA: Diagnosis not present

## 2023-02-10 ENCOUNTER — Inpatient Hospital Stay: Payer: Medicare Other

## 2023-02-10 ENCOUNTER — Telehealth: Payer: Self-pay | Admitting: Family Medicine

## 2023-02-10 NOTE — Telephone Encounter (Signed)
Home Health Verbal Orders  Agency: [facility]   Caller: L[facility] Call back #: [phone] Fax #:    Requesting PT:    Reason for Request:    Frequency:  1wk1, 2wk3, 1wk3   HH needs F2F w/in last 30 days

## 2023-02-10 NOTE — Telephone Encounter (Signed)
Gave the verbal order to [employer] [facility] for PT 1wk1, 2wk3, 1wk3

## 2023-02-10 NOTE — Telephone Encounter (Signed)
Ok for verbal orders ?

## 2023-02-16 DIAGNOSIS — E440 Moderate protein-calorie malnutrition: Secondary | ICD-10-CM | POA: Diagnosis not present

## 2023-02-16 DIAGNOSIS — Z9181 History of falling: Secondary | ICD-10-CM | POA: Diagnosis not present

## 2023-02-16 DIAGNOSIS — N390 Urinary tract infection, site not specified: Secondary | ICD-10-CM | POA: Diagnosis not present

## 2023-02-16 DIAGNOSIS — B961 Klebsiella pneumoniae [K. pneumoniae] as the cause of diseases classified elsewhere: Secondary | ICD-10-CM | POA: Diagnosis not present

## 2023-02-17 NOTE — Telephone Encounter (Signed)
Message left for her to call me back.

## 2023-02-20 ENCOUNTER — Telehealth: Payer: Self-pay | Admitting: Family Medicine

## 2023-02-20 NOTE — Telephone Encounter (Signed)
Will place forms in Dr Tabori to be signed folder  

## 2023-02-20 NOTE — Telephone Encounter (Signed)
Home Health Verbal Orders  Agency: [facility]   Caller: (Contact and title) Call back #: [phone] Fax #:[phone]    Requesting OT/ PT/ Skilled nursing/ Social Work/ Speech:    Reason for Request:    Frequency:     HH needs F2F w/in last 30 days    Charge Sheet attached and placed in front bin.

## 2023-02-22 DIAGNOSIS — B961 Klebsiella pneumoniae [K. pneumoniae] as the cause of diseases classified elsewhere: Secondary | ICD-10-CM

## 2023-02-22 DIAGNOSIS — Z9181 History of falling: Secondary | ICD-10-CM

## 2023-02-22 DIAGNOSIS — E440 Moderate protein-calorie malnutrition: Secondary | ICD-10-CM

## 2023-02-22 DIAGNOSIS — N390 Urinary tract infection, site not specified: Secondary | ICD-10-CM

## 2023-02-24 DIAGNOSIS — B961 Klebsiella pneumoniae [K. pneumoniae] as the cause of diseases classified elsewhere: Secondary | ICD-10-CM | POA: Diagnosis not present

## 2023-02-24 DIAGNOSIS — Z9181 History of falling: Secondary | ICD-10-CM | POA: Diagnosis not present

## 2023-02-24 DIAGNOSIS — E440 Moderate protein-calorie malnutrition: Secondary | ICD-10-CM | POA: Diagnosis not present

## 2023-02-24 DIAGNOSIS — N390 Urinary tract infection, site not specified: Secondary | ICD-10-CM | POA: Diagnosis not present

## 2023-02-24 NOTE — Telephone Encounter (Signed)
Form signed and faxed

## 2023-02-25 ENCOUNTER — Other Ambulatory Visit: Payer: Self-pay | Admitting: Family Medicine

## 2023-02-25 DIAGNOSIS — F419 Anxiety disorder, unspecified: Secondary | ICD-10-CM

## 2023-02-27 DIAGNOSIS — B961 Klebsiella pneumoniae [K. pneumoniae] as the cause of diseases classified elsewhere: Secondary | ICD-10-CM | POA: Diagnosis not present

## 2023-02-27 DIAGNOSIS — N390 Urinary tract infection, site not specified: Secondary | ICD-10-CM | POA: Diagnosis not present

## 2023-02-27 DIAGNOSIS — Z9181 History of falling: Secondary | ICD-10-CM | POA: Diagnosis not present

## 2023-02-27 DIAGNOSIS — E440 Moderate protein-calorie malnutrition: Secondary | ICD-10-CM | POA: Diagnosis not present

## 2023-03-01 ENCOUNTER — Encounter: Payer: Self-pay | Admitting: Nurse Practitioner

## 2023-03-01 ENCOUNTER — Inpatient Hospital Stay: Payer: Medicare Other | Attending: Nurse Practitioner

## 2023-03-01 ENCOUNTER — Inpatient Hospital Stay: Payer: Medicare Other | Admitting: Nurse Practitioner

## 2023-03-01 VITALS — BP 105/61 | HR 77 | Temp 98.1°F | Resp 16 | Wt 97.4 lb

## 2023-03-01 DIAGNOSIS — D509 Iron deficiency anemia, unspecified: Secondary | ICD-10-CM | POA: Insufficient documentation

## 2023-03-01 DIAGNOSIS — E039 Hypothyroidism, unspecified: Secondary | ICD-10-CM | POA: Diagnosis not present

## 2023-03-01 DIAGNOSIS — E2740 Unspecified adrenocortical insufficiency: Secondary | ICD-10-CM | POA: Diagnosis not present

## 2023-03-01 DIAGNOSIS — E559 Vitamin D deficiency, unspecified: Secondary | ICD-10-CM | POA: Diagnosis not present

## 2023-03-01 LAB — CBC WITH DIFFERENTIAL (CANCER CENTER ONLY)
Abs Immature Granulocytes: 0.03 10*3/uL (ref 0.00–0.07)
Basophils Absolute: 0.1 10*3/uL (ref 0.0–0.1)
Basophils Relative: 1 %
Eosinophils Absolute: 0.2 10*3/uL (ref 0.0–0.5)
Eosinophils Relative: 2 %
HCT: 34.6 % — ABNORMAL LOW (ref 36.0–46.0)
Hemoglobin: 10.1 g/dL — ABNORMAL LOW (ref 12.0–15.0)
Immature Granulocytes: 0 %
Lymphocytes Relative: 17 %
Lymphs Abs: 1.5 10*3/uL (ref 0.7–4.0)
MCH: 24.1 pg — ABNORMAL LOW (ref 26.0–34.0)
MCHC: 29.2 g/dL — ABNORMAL LOW (ref 30.0–36.0)
MCV: 82.6 fL (ref 80.0–100.0)
Monocytes Absolute: 0.5 10*3/uL (ref 0.1–1.0)
Monocytes Relative: 6 %
Neutro Abs: 6.6 10*3/uL (ref 1.7–7.7)
Neutrophils Relative %: 74 %
Platelet Count: 500 10*3/uL — ABNORMAL HIGH (ref 150–400)
RBC: 4.19 MIL/uL (ref 3.87–5.11)
RDW: 24.4 % — ABNORMAL HIGH (ref 11.5–15.5)
WBC Count: 8.9 10*3/uL (ref 4.0–10.5)
nRBC: 0 % (ref 0.0–0.2)

## 2023-03-01 LAB — SAMPLE TO BLOOD BANK

## 2023-03-01 LAB — FERRITIN: Ferritin: 68 ng/mL (ref 11–307)

## 2023-03-01 NOTE — Progress Notes (Signed)
  [facility] OFFICE PROGRESS NOTE   Diagnosis: Iron deficiency anemia  INTERVAL HISTORY:   Ms. Mcmullen returns as scheduled.  She is taking oral iron 3 times a day.  No constipation or nausea.  Slight improvement in energy level.  Appetite is better.  She is not aware of any bleeding.  She continues to have intermittent balance issues.  Objective:  Vital signs in last 24 hours:  Blood pressure 105/61, pulse 77, temperature 98.1 F (36.7 C), temperature source Temporal, resp. rate 16, weight 97 lb 6.4 oz (44.2 kg), SpO2 100 %.    Resp: Lungs clear bilaterally. Cardio: Regular rate and rhythm. GI: No hepatosplenomegaly. Vascular: No leg edema.  Lab Results:  Lab Results  Component Value Date   WBC 12.1 (H) 01/31/2023   HGB 9.8 (L) 01/31/2023   HCT 34.1 (L) 01/31/2023   MCV 74.8 (L) 01/31/2023   PLT 556 (H) 01/31/2023   NEUTROABS 9.6 (H) 01/31/2023    Imaging:  No results found.  Medications: I have reviewed the patient's current medications.  Assessment/Plan: Iron deficiency anemia 12/16/2022 hemoglobin 5.6, MCV 68, ferritin 3 12/17/2022 2 units red blood cells 12/17/2022 ferrous gluconate 125 mg IV 12/18/2022 upper endoscopy-esophageal plaques were found secondary to candidiasis, examination otherwise normal 12/18/2022 colonoscopy-post polypectomy scar at the ileocecal valve, melanosis in the colon, anal stricture and nonthrombosed external hemorrhoids found on digital rectal exam, 2 polyps in the distal transverse colon and distal ascending colon.  Tubular adenoma 02/03/2023 stool positive for occult blood x 3 02/03/2023 urine negative for blood Hospitalization with severe microcytic anemia 12/16/2022 Hospitalization September 2018 with severe microcytic anemia 04/25/2017 hemoglobin 5.0, MCV 53, ferritin 2 04/26/2017 stool positive for occult blood Upper endoscopy 04/27/2017-mild Candida esophagitis Colonoscopy 04/27/2017-melanosis, lipomatous ileocecal  valve-tubular adenoma, 10 mm polyp at the transverse colon-tubular adenoma Capsule endoscopy 05/10/2018-single linear erosion at 1 hour 32 minutes, otherwise negative Colonoscopy 06/01/2018-polyps removed from the ileocecal valve and ascending colon Esophageal candidiasis 12/18/2022, completed treatment with fluconazole Addison's disease CKD CAD  Disposition: Ms. Lowden remains stable from a hematologic standpoint.  Hemoglobin is mildly improved, MCV has corrected into low normal range.  We will follow-up on the ferritin from today.  She will continue oral iron for now.  We discussed the positive stool cards from 02/03/2023.  Dr. Leone Payor is aware, left her a message to discuss further.  She plans to contact his office.  She will return for lab and an office visit in about 6 weeks.    Lonna Cobb ANP/GNP-BC   03/01/2023  9:46 AM

## 2023-03-02 ENCOUNTER — Telehealth: Payer: Self-pay

## 2023-03-02 NOTE — Telephone Encounter (Signed)
-----   Message from Lonna Cobb sent at 03/02/2023 12:57 PM EDT ----- Please let her know ferritin is higher.  Continue oral iron.  Follow-up as scheduled.

## 2023-03-02 NOTE — Telephone Encounter (Signed)
Patient gave verbal understanding and had no further questions or concerns  

## 2023-03-03 ENCOUNTER — Telehealth: Payer: Self-pay | Admitting: Family Medicine

## 2023-03-03 DIAGNOSIS — H52203 Unspecified astigmatism, bilateral: Secondary | ICD-10-CM | POA: Diagnosis not present

## 2023-03-03 DIAGNOSIS — H40013 Open angle with borderline findings, low risk, bilateral: Secondary | ICD-10-CM | POA: Diagnosis not present

## 2023-03-03 DIAGNOSIS — B961 Klebsiella pneumoniae [K. pneumoniae] as the cause of diseases classified elsewhere: Secondary | ICD-10-CM | POA: Diagnosis not present

## 2023-03-03 DIAGNOSIS — E440 Moderate protein-calorie malnutrition: Secondary | ICD-10-CM | POA: Diagnosis not present

## 2023-03-03 DIAGNOSIS — Z961 Presence of intraocular lens: Secondary | ICD-10-CM | POA: Diagnosis not present

## 2023-03-03 DIAGNOSIS — N390 Urinary tract infection, site not specified: Secondary | ICD-10-CM | POA: Diagnosis not present

## 2023-03-03 DIAGNOSIS — Z9181 History of falling: Secondary | ICD-10-CM | POA: Diagnosis not present

## 2023-03-03 NOTE — Telephone Encounter (Signed)
Form is a review form and placed in Dr Beverely Low to be reviewed folder

## 2023-03-03 NOTE — Telephone Encounter (Signed)
Home Health Verbal Orders  Agency: [facility]   Caller: Darnelle  Call back #: [phone] Fax #[phone]      Charge sheet attached and placed in front bin.

## 2023-03-08 DIAGNOSIS — N390 Urinary tract infection, site not specified: Secondary | ICD-10-CM | POA: Diagnosis not present

## 2023-03-08 DIAGNOSIS — B961 Klebsiella pneumoniae [K. pneumoniae] as the cause of diseases classified elsewhere: Secondary | ICD-10-CM | POA: Diagnosis not present

## 2023-03-08 DIAGNOSIS — Z9181 History of falling: Secondary | ICD-10-CM | POA: Diagnosis not present

## 2023-03-08 DIAGNOSIS — E440 Moderate protein-calorie malnutrition: Secondary | ICD-10-CM | POA: Diagnosis not present

## 2023-03-10 DIAGNOSIS — B961 Klebsiella pneumoniae [K. pneumoniae] as the cause of diseases classified elsewhere: Secondary | ICD-10-CM | POA: Diagnosis not present

## 2023-03-10 DIAGNOSIS — N390 Urinary tract infection, site not specified: Secondary | ICD-10-CM | POA: Diagnosis not present

## 2023-03-10 DIAGNOSIS — Z9181 History of falling: Secondary | ICD-10-CM | POA: Diagnosis not present

## 2023-03-10 DIAGNOSIS — E440 Moderate protein-calorie malnutrition: Secondary | ICD-10-CM | POA: Diagnosis not present

## 2023-03-13 ENCOUNTER — Encounter: Payer: Self-pay | Admitting: Family Medicine

## 2023-03-13 ENCOUNTER — Ambulatory Visit (INDEPENDENT_AMBULATORY_CARE_PROVIDER_SITE_OTHER): Payer: Medicare Other | Admitting: Family Medicine

## 2023-03-13 VITALS — BP 108/70 | HR 95 | Temp 97.9°F | Resp 16 | Ht 63.0 in | Wt 98.1 lb

## 2023-03-13 DIAGNOSIS — R0781 Pleurodynia: Secondary | ICD-10-CM | POA: Diagnosis not present

## 2023-03-13 DIAGNOSIS — E440 Moderate protein-calorie malnutrition: Secondary | ICD-10-CM | POA: Diagnosis not present

## 2023-03-13 DIAGNOSIS — R413 Other amnesia: Secondary | ICD-10-CM | POA: Diagnosis not present

## 2023-03-13 DIAGNOSIS — B961 Klebsiella pneumoniae [K. pneumoniae] as the cause of diseases classified elsewhere: Secondary | ICD-10-CM | POA: Diagnosis not present

## 2023-03-13 DIAGNOSIS — Z9181 History of falling: Secondary | ICD-10-CM | POA: Diagnosis not present

## 2023-03-13 DIAGNOSIS — N390 Urinary tract infection, site not specified: Secondary | ICD-10-CM | POA: Diagnosis not present

## 2023-03-13 DIAGNOSIS — J329 Chronic sinusitis, unspecified: Secondary | ICD-10-CM | POA: Diagnosis not present

## 2023-03-13 DIAGNOSIS — B9689 Other specified bacterial agents as the cause of diseases classified elsewhere: Secondary | ICD-10-CM | POA: Diagnosis not present

## 2023-03-13 MED ORDER — AMOXICILLIN 875 MG PO TABS: 875.0000 mg | ORAL_TABLET | Freq: Two times a day (BID) | ORAL | 0 refills | Status: AC

## 2023-03-13 MED ORDER — NAPROXEN 500 MG PO TABS: 500.0000 mg | ORAL_TABLET | Freq: Two times a day (BID) | ORAL | 0 refills | Status: DC

## 2023-03-13 MED ORDER — DONEPEZIL HCL 5 MG PO TABS: 5.0000 mg | ORAL_TABLET | Freq: Every day | ORAL | 3 refills | Status: DC

## 2023-03-13 NOTE — Patient Instructions (Addendum)
Follow up as needed or as scheduled START the Amoxicillin twice daily w/ food for the sinuses TAKE the Naproxen as needed for pain.  Take w/ food HEAT! Once you are done w/ the Amoxicillin, START the Donepezil nightly They will call you to schedule neuropsych testing to assess your memory Call with any questions or concerns Hang in there!!

## 2023-03-13 NOTE — Progress Notes (Unsigned)
Subjective:    Patient ID: Kaitlyn Adams, female    DOB: [birth year], 72 y.o.   MRN: [id number]  HPI 'i think I have another sinus infection'- pt reports frontal headache that started yesterday.  Some drainage.  No fever.  Mild cough started yesterday.  Pt reports some SOB despite using inhalers.  Husband reports she's been complaining of sxs for 'over a week'.  Fall- pt reports falling 7/11.  Did not get evaluated.  Reports she did not hit her head.  Landed on R side.  Now having a lot of R sided chest pain and soreness over rib.  Pt is not sure why she fell.  Is participating in PT.  Memory loss- pt is concerned that she is repeating herself multiple times and not remembering conversations.  Both parents had dementia.  Husband doesn't feel that it's 'that bad'.     Review of Systems For ROS see HPI     Objective:   Physical Exam Vitals reviewed.  Constitutional:      General: She is not in acute distress.    Appearance: Normal appearance. She is well-developed. She is not ill-appearing.  HENT:     Head: Normocephalic and atraumatic.     Right Ear: Tympanic membrane and ear canal normal.     Left Ear: Tympanic membrane and ear canal normal.     Nose: Mucosal edema and congestion present. No rhinorrhea.     Right Sinus: Maxillary sinus tenderness and frontal sinus tenderness present.     Left Sinus: Maxillary sinus tenderness and frontal sinus tenderness present.     Mouth/Throat:     Mouth: Mucous membranes are moist.     Pharynx: Uvula midline. Posterior oropharyngeal erythema present. No oropharyngeal exudate.  Eyes:     Conjunctiva/sclera: Conjunctivae normal.     Pupils: Pupils are equal, round, and reactive to light.  Cardiovascular:     Rate and Rhythm: Normal rate and regular rhythm.     Heart sounds: Normal heart sounds.  Pulmonary:     Effort: Pulmonary effort is normal. No respiratory distress.     Breath sounds: Normal breath sounds. No wheezing.   Musculoskeletal:     Cervical back: Normal range of motion and neck supple.  Lymphadenopathy:     Cervical: No cervical adenopathy.  Skin:    General: Skin is warm and dry.  Neurological:     General: No focal deficit present.     Mental Status: She is alert and oriented to person, place, and time.     Cranial Nerves: No cranial nerve deficit.     Motor: No weakness.     Coordination: Coordination normal.  Psychiatric:        Mood and Affect: Mood normal.        Behavior: Behavior normal.        Thought Content: Thought content normal.           Assessment & Plan:  Bacterial Sinusitis- pt has hx of this and it is a recurrent problem.  Given her adrenal insufficiency and frail, deconditioned state will start abx.  Husband reports she has had sxs for at least a week.  Reviewed allergies, will start Amoxicillin.  Rib pain R side- new.  Pt sustained another fall.  Husband was outside and pt doesn't know how she fell.  At this time, her only concern is ongoing R rib pain.  She states 'i know it's not broken but it hurts'.  She has  taken Naproxen in the past  w/ good relief and without side effects.  Will refill.  Also encouraged heat for symptom relief.  Memory loss- new.  Pt is very concerned- particularly in the setting of both parents having dementia.  Husband doesn't feel things are bad or worsening.  Pt is interested in starting Aricept.  Told her I would start this if she was agreeable to Neuropsych testing.  Pt is agreeable, referral made.

## 2023-03-14 ENCOUNTER — Encounter: Payer: Self-pay | Admitting: Psychology

## 2023-03-16 ENCOUNTER — Other Ambulatory Visit: Payer: Self-pay | Admitting: Family Medicine

## 2023-03-20 ENCOUNTER — Telehealth: Payer: Self-pay | Admitting: Family Medicine

## 2023-03-20 NOTE — Telephone Encounter (Signed)
Ok for verbal orders.  Pt was seen recently so she has a Designer, television/film set

## 2023-03-20 NOTE — Telephone Encounter (Signed)
Left VM for [facility] PT to return my call

## 2023-03-20 NOTE — Telephone Encounter (Signed)
[facility] returned [facility] call and I gave the verbal ok per message.

## 2023-03-20 NOTE — Telephone Encounter (Signed)
Home Health Verbal Orders  Agency:[facility]     Caller: Monique- PT  Call back #: [phone] Fax #:    Requesting PT:    Reason for Request:    Frequency:   1wk3    HH needs F2F w/in last 30 days

## 2023-03-20 NOTE — Telephone Encounter (Signed)
Is it to give a verbal order for the following  Requesting PT:     Reason for Request:     Frequency:   1wk3

## 2023-03-21 ENCOUNTER — Telehealth: Payer: Self-pay | Admitting: Family Medicine

## 2023-03-21 NOTE — Telephone Encounter (Signed)
Placed is Dr.Tabori folder

## 2023-03-21 NOTE — Telephone Encounter (Signed)
Received forms from [facility] Printed and placed in provider bin

## 2023-03-22 DIAGNOSIS — Z9181 History of falling: Secondary | ICD-10-CM | POA: Diagnosis not present

## 2023-03-22 DIAGNOSIS — N390 Urinary tract infection, site not specified: Secondary | ICD-10-CM | POA: Diagnosis not present

## 2023-03-22 DIAGNOSIS — E440 Moderate protein-calorie malnutrition: Secondary | ICD-10-CM | POA: Diagnosis not present

## 2023-03-22 DIAGNOSIS — B961 Klebsiella pneumoniae [K. pneumoniae] as the cause of diseases classified elsewhere: Secondary | ICD-10-CM | POA: Diagnosis not present

## 2023-03-23 ENCOUNTER — Telehealth: Payer: Self-pay | Admitting: Family Medicine

## 2023-03-23 NOTE — Telephone Encounter (Signed)
Received forms from [facility] Printed & placed in provider bin

## 2023-03-23 NOTE — Telephone Encounter (Signed)
Forms placed in Dr Tabori to be signed folder  

## 2023-03-24 NOTE — Telephone Encounter (Signed)
Forms completed and returned to [facility] 

## 2023-03-24 NOTE — Telephone Encounter (Signed)
Forms faxed and placed in scan  

## 2023-03-24 NOTE — Telephone Encounter (Signed)
Faxed and placed in scan.

## 2023-03-28 DIAGNOSIS — E440 Moderate protein-calorie malnutrition: Secondary | ICD-10-CM | POA: Diagnosis not present

## 2023-03-28 DIAGNOSIS — N390 Urinary tract infection, site not specified: Secondary | ICD-10-CM | POA: Diagnosis not present

## 2023-03-28 DIAGNOSIS — B961 Klebsiella pneumoniae [K. pneumoniae] as the cause of diseases classified elsewhere: Secondary | ICD-10-CM | POA: Diagnosis not present

## 2023-03-28 DIAGNOSIS — Z9181 History of falling: Secondary | ICD-10-CM | POA: Diagnosis not present

## 2023-04-01 ENCOUNTER — Other Ambulatory Visit: Payer: Self-pay | Admitting: Family Medicine

## 2023-04-03 ENCOUNTER — Encounter: Payer: Self-pay | Admitting: Internal Medicine

## 2023-04-03 ENCOUNTER — Telehealth: Payer: Self-pay | Admitting: Internal Medicine

## 2023-04-03 ENCOUNTER — Ambulatory Visit (INDEPENDENT_AMBULATORY_CARE_PROVIDER_SITE_OTHER): Payer: Medicare Other | Admitting: Internal Medicine

## 2023-04-03 VITALS — BP 100/70 | HR 86 | Temp 99.6°F | Ht 63.5 in | Wt 99.6 lb

## 2023-04-03 DIAGNOSIS — K219 Gastro-esophageal reflux disease without esophagitis: Secondary | ICD-10-CM | POA: Diagnosis not present

## 2023-04-03 DIAGNOSIS — J4530 Mild persistent asthma, uncomplicated: Secondary | ICD-10-CM | POA: Diagnosis not present

## 2023-04-03 DIAGNOSIS — J309 Allergic rhinitis, unspecified: Secondary | ICD-10-CM

## 2023-04-03 MED ORDER — FLUTICASONE-SALMETEROL 115-21 MCG/ACT IN AERO: 2.0000 | INHALATION_SPRAY | Freq: Two times a day (BID) | RESPIRATORY_TRACT | 12 refills | Status: DC

## 2023-04-03 NOTE — Patient Instructions (Addendum)
Please schedule follow up scheduled with myself in 3-4 months.  If my schedule is not open yet, we will contact you with a reminder closer to that time. Please call [phone] if you haven't heard from [place] a month before.   I think your symptoms of asthma are not well controlled.  Let's try to increase your therapy with a combination inhaler. I am starting you on adviar 2 puffs twice daily - gargle after use.  You should stop the pulmicort when you start this.   Continue the albuterol inhaler. Take the albuterol rescue inhaler every 4 to 6 hours as needed for wheezing or shortness of breath. You can also take it 15 minutes before exercise or exertional activity. Keep up the exercise program at home.   If your cough returns you can start taking the xyzal again.  Continue the acid reflux medication.   Call me sooner if issues or concerns with your breathing.

## 2023-04-03 NOTE — Progress Notes (Signed)
Kaitlyn Adams    [id number]    1951-06-06  Primary Care Physician:Tabori, Helane Rima, MD Date of Appointment: 04/03/2023 Established Patient Visit  Chief complaint:   Chief Complaint  Patient presents with   Follow-up    Sob worse with exertion/sitting still.  Dry cough.  No congestion.     HPI: Kaitlyn Adams is a 72 y.o. woman with chronic cough and mild persistent asthma .  Interval Updates: Here for follow up. No side effects with pulmicort.  Albuterol use some days 2-3 times, some days none. Humidty makes things worse and she doesn't even feel like leaving the house because of this. Breathing is not ideal.  Also has iron deficiency anemia, getting iron supplements.  Getting home health She has started a home exercise program with physical therapy - doing laps in the house.  She did take the xyzal and it did help her cough. She stopped taking when it ran out.   Current Regimen: pulmicort BID, albuterol Asthma Triggers: humidity, URI Exacerbations in the last year: History of hospitalization or intubation: Allergy Testing: never had.  GERD: controlled Allergic Rhinitis: yes controlled. Intermittently on antihistamine.  ACT:  Asthma Control Test ACT Total Score  04/03/2023  9:14 AM 18   FeNO: unable to complete maneuver.   I have reviewed the patient's family social and past medical history and updated as appropriate.   Past Medical History:  Diagnosis Date   Addison disease (HCC)    Anemia    Asthma    - normal spirometry 2009 FEV1  >90% predicted.  - methacoline challenge neg 11/09   Bronchitis    recurrent   Cataract    Chronic cough    - sinus CT 03-23-10 > neg.  - allergy profile 03-23-10 > nl, IgE 14.  - flutter valve rx 03-23-10   Fibromyalgia    GERD (gastroesophageal reflux disease)    exacerbating VCD   Glaucoma    History of blood transfusion    Hyperlipidemia    Hypertension    Hyperthyroidism    with hot nodule.  - total  thyroidectomy 11-05-08 benign -> subsequent hypothyroidism.   Hypokalemia    Hypothyroidism    Hypothyroidism    a. following thyroidectomy.   Mild CAD    a. LHC 04/2015 - mild nonobstructive CAD with 30% dLAD, 40% D1, 25% pLCx, 30% mRCA, 30% RPDA, normal EF >65% with normal LVEDP.   Orthostatic hypotension    Paroxysmal atrial fibrillation (HCC)    Renal insufficiency    Vasomotor rhinitis    exacerbates VCD   Vocal cord dysfunction    proven on FOB 9/09    Past Surgical History:  Procedure Laterality Date   ABDOMINAL HYSTERECTOMY  1980   ABDOMINAL HYSTERECTOMY     BASAL CELL CARCINOMA EXCISION  11/08   BIOPSY  12/18/2022   Procedure: BIOPSY;  Surgeon: Iva Boop, MD;  Location: [facility];  Service: Gastroenterology;;   CARDIAC CATHETERIZATION N/A 05/15/2015   Procedure: Left Heart Cath and Coronary Angiography;  Surgeon: Tonny Bollman, MD;  Location: [facility] INVASIVE CV LAB;  Service: Cardiovascular;  Laterality: N/A;   COLONOSCOPY N/A 04/27/2017   Dr Lavon Paganini for scant rectal bleeding and iron def anemia: Melanosis coli, lipomatous IC valve, left sided tics, non-bleeding hemorrhoid, 10 mm transverse polyp.     COLONOSCOPY WITH PROPOFOL N/A 12/18/2022   Procedure: COLONOSCOPY WITH PROPOFOL;  Surgeon: Iva Boop, MD;  Location: [facility]  ENDOSCOPY;  Service: Gastroenterology;  Laterality: N/A;   ESOPHAGOGASTRODUODENOSCOPY N/A 04/27/2017   Napoleon Form, MD; [facility] ENDOSCOPY. for iron def anemia.  Monilial/candidial esophagitis. Small HH.     ESOPHAGOGASTRODUODENOSCOPY (EGD) WITH PROPOFOL N/A 12/18/2022   Procedure: ESOPHAGOGASTRODUODENOSCOPY (EGD) WITH PROPOFOL;  Surgeon: Iva Boop, MD;  Location: [facility] ENDOSCOPY;  Service: Gastroenterology;  Laterality: N/A;   EYE SURGERY     POLYPECTOMY  12/18/2022   Procedure: POLYPECTOMY;  Surgeon: Iva Boop, MD;  Location: [facility] ENDOSCOPY;  Service: Gastroenterology;;   scar tissue removal  1982   THYROID SURGERY  2010   THYROIDECTOMY   11-05-08    Family History  Problem Relation Age of Onset   Asthma Mother    Heart disease Mother    Emphysema Father    Heart disease Father    Irregular heart beat Brother    Asthma Grandchild     Social History   Occupational History   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  Gen:      No acute distress, thin, frail ENT:  no nasal polyps, mucus membranes moist Lungs:    No increased respiratory effort, symmetric chest wall excursion, clear to auscultation bilaterally, no wheezes or crackles CV:         Regular rate and rhythm; no murmurs, rubs, or gallops.  No pedal edema   Data Reviewed: Imaging: I have personally reviewed the chest xray April 2024- no acute cardiopulmonary process.   PFTs:      No data to display         I have personally reviewed the patient's PFTs and unable to complete feno or spirometry maneuvers in office.   Labs: Lab Results  Component Value Date   WBC 8.9 03/01/2023   HGB 10.1 (L) 03/01/2023   HCT 34.6 (L) 03/01/2023   MCV 82.6 03/01/2023   PLT 500 (H) 03/01/2023    Immunization status: Immunization History  Administered Date(s) Administered   Fluad Quad(high Dose 65+) 10/02/2019   Influenza Split 09/11/2012   Influenza Whole 06/22/2009   Influenza, High Dose Seasonal PF 07/06/2017, 08/01/2018   Influenza-Unspecified 07/07/2021   PFIZER(Purple  Top)SARS-COV-2 Vaccination 10/18/2019, 11/13/2019, 05/28/2020   Pneumococcal Conjugate-13 11/18/2015   Pneumococcal Polysaccharide-23 08/23/2003, 07/06/2017   Tdap 07/10/2012    External Records Personally Reviewed: pcp  Assessment:  Moderate persistent asthma, not well controlled Chronic rhinitis Gerd   Plan/Recommendations: I think your symptoms of asthma are not well controlled.  Let's try to increase your therapy with a combination inhaler. I am starting you on advair 2 puffs twice daily - gargle after use.  You should stop the pulmicort when you start this.   Continue the albuterol inhaler. Take the albuterol rescue inhaler every 4 to 6 hours as needed for wheezing or shortness of  breath. You can also take it 15 minutes before exercise or exertional activity. Keep up the exercise program at home.   If your cough returns you can start taking the xyzal again.  Continue the acid reflux medication.   Call me sooner if issues or concerns with your breathing.    Return to Care: Return in about 3 months (around 07/04/2023).   Durel Salts, MD Pulmonary and Critical Care Medicine [facility] Office:[phone]

## 2023-04-04 DIAGNOSIS — Z9181 History of falling: Secondary | ICD-10-CM | POA: Diagnosis not present

## 2023-04-04 DIAGNOSIS — N390 Urinary tract infection, site not specified: Secondary | ICD-10-CM | POA: Diagnosis not present

## 2023-04-04 DIAGNOSIS — B961 Klebsiella pneumoniae [K. pneumoniae] as the cause of diseases classified elsewhere: Secondary | ICD-10-CM | POA: Diagnosis not present

## 2023-04-04 DIAGNOSIS — E440 Moderate protein-calorie malnutrition: Secondary | ICD-10-CM | POA: Diagnosis not present

## 2023-04-06 ENCOUNTER — Other Ambulatory Visit: Payer: Self-pay

## 2023-04-06 DIAGNOSIS — E559 Vitamin D deficiency, unspecified: Secondary | ICD-10-CM | POA: Diagnosis not present

## 2023-04-06 DIAGNOSIS — E2740 Unspecified adrenocortical insufficiency: Secondary | ICD-10-CM | POA: Diagnosis not present

## 2023-04-06 DIAGNOSIS — E039 Hypothyroidism, unspecified: Secondary | ICD-10-CM | POA: Diagnosis not present

## 2023-04-06 NOTE — Telephone Encounter (Signed)
Per test claim Brand Advair HFA is covered by patients plan at $45.00

## 2023-04-07 ENCOUNTER — Other Ambulatory Visit (HOSPITAL_COMMUNITY): Payer: Self-pay

## 2023-04-10 MED ORDER — FLUTICASONE-SALMETEROL 45-21 MCG/ACT IN AERO: 2.0000 | INHALATION_SPRAY | Freq: Two times a day (BID) | RESPIRATORY_TRACT | 12 refills | Status: DC

## 2023-04-11 ENCOUNTER — Telehealth: Payer: Self-pay | Admitting: Internal Medicine

## 2023-04-11 ENCOUNTER — Inpatient Hospital Stay (HOSPITAL_BASED_OUTPATIENT_CLINIC_OR_DEPARTMENT_OTHER): Payer: Medicare Other | Admitting: Oncology

## 2023-04-11 ENCOUNTER — Inpatient Hospital Stay: Payer: Medicare Other | Attending: Nurse Practitioner

## 2023-04-11 VITALS — BP 126/77 | HR 78 | Temp 98.2°F | Resp 18 | Ht 63.0 in | Wt 100.8 lb

## 2023-04-11 DIAGNOSIS — N189 Chronic kidney disease, unspecified: Secondary | ICD-10-CM | POA: Diagnosis not present

## 2023-04-11 DIAGNOSIS — D509 Iron deficiency anemia, unspecified: Secondary | ICD-10-CM | POA: Insufficient documentation

## 2023-04-11 DIAGNOSIS — I2510 Atherosclerotic heart disease of native coronary artery without angina pectoris: Secondary | ICD-10-CM | POA: Insufficient documentation

## 2023-04-11 LAB — CBC WITH DIFFERENTIAL (CANCER CENTER ONLY)
Abs Immature Granulocytes: 0.01 10*3/uL (ref 0.00–0.07)
Basophils Absolute: 0.1 10*3/uL (ref 0.0–0.1)
Basophils Relative: 1 %
Eosinophils Absolute: 0 10*3/uL (ref 0.0–0.5)
Eosinophils Relative: 1 %
HCT: 44.8 % (ref 36.0–46.0)
Hemoglobin: 13.6 g/dL (ref 12.0–15.0)
Immature Granulocytes: 0 %
Lymphocytes Relative: 19 %
Lymphs Abs: 1.1 10*3/uL (ref 0.7–4.0)
MCH: 26.8 pg (ref 26.0–34.0)
MCHC: 30.4 g/dL (ref 30.0–36.0)
MCV: 88.4 fL (ref 80.0–100.0)
Monocytes Absolute: 0.4 10*3/uL (ref 0.1–1.0)
Monocytes Relative: 7 %
Neutro Abs: 4.1 10*3/uL (ref 1.7–7.7)
Neutrophils Relative %: 72 %
Platelet Count: 368 10*3/uL (ref 150–400)
RBC: 5.07 MIL/uL (ref 3.87–5.11)
RDW: 15.9 % — ABNORMAL HIGH (ref 11.5–15.5)
WBC Count: 5.7 10*3/uL (ref 4.0–10.5)
nRBC: 0 % (ref 0.0–0.2)

## 2023-04-11 LAB — FERRITIN: Ferritin: 20 ng/mL (ref 11–307)

## 2023-04-11 NOTE — Telephone Encounter (Signed)
Please tell her Dr. Truett Perna wants me to see her and consider additional testing for anemia  Arrange follow-up appointment

## 2023-04-11 NOTE — Progress Notes (Signed)
  [facility] OFFICE PROGRESS NOTE   Diagnosis: Iron deficiency anemia  INTERVAL HISTORY:   Kaitlyn Adams returns as scheduled.  She feels well.  She reports an improved appetite.  No bleeding.  Mild constipation.  She is taking iron 2-3 times per day.  She has not seen Dr. Leone Payor for further evaluation of the iron deficiency.  Objective:  Vital signs in last 24 hours:  Blood pressure 126/77, pulse 78, temperature 98.2 F (36.8 C), temperature source Oral, resp. rate 18, height 5\' 3"  (1.6 m), weight 100 lb 12.8 oz (45.7 kg), SpO2 100%.    Resp: Lungs clear bilaterally Cardio: Regular rate and rhythm GI: Nontender, no mass, no hepatosplenomegaly Vascular: No leg edema   Lab Results:  Lab Results  Component Value Date   WBC 5.7 04/11/2023   HGB 13.6 04/11/2023   HCT 44.8 04/11/2023   MCV 88.4 04/11/2023   PLT 368 04/11/2023   NEUTROABS 4.1 04/11/2023    CMP  Lab Results  Component Value Date   NA 136 02/07/2023   K 2.8 (LL) 02/07/2023   CL 104 02/07/2023   CO2 23 02/07/2023   GLUCOSE 78 02/07/2023   BUN 12 02/07/2023   CREATININE 1.23 (H) 02/07/2023   CALCIUM 9.6 02/07/2023   PROT 8.9 (H) 02/07/2023   ALBUMIN 3.5 02/07/2023   AST 19 02/07/2023   ALT 12 02/07/2023   ALKPHOS 98 02/07/2023   BILITOT 0.4 02/07/2023   GFRNONAA 37 (L) 12/18/2022   GFRAA 60 (L) 10/26/2018    Medications: I have reviewed the patient's current medications.   Assessment/Plan: Iron deficiency anemia 12/16/2022 hemoglobin 5.6, MCV 68, ferritin 3 12/17/2022 2 units red blood cells 12/17/2022 ferrous gluconate 125 mg IV 12/18/2022 upper endoscopy-esophageal plaques were found secondary to candidiasis, examination otherwise normal 12/18/2022 colonoscopy-post polypectomy scar at the ileocecal valve, melanosis in the colon, anal stricture and nonthrombosed external hemorrhoids found on digital rectal exam, 2 polyps in the distal transverse colon and distal ascending colon.   Tubular adenoma 02/03/2023 stool positive for occult blood x 3 02/03/2023 urine negative for blood Hospitalization with severe microcytic anemia 12/16/2022 Hospitalization September 2018 with severe microcytic anemia 04/25/2017 hemoglobin 5.0, MCV 53, ferritin 2 04/26/2017 stool positive for occult blood Upper endoscopy 04/27/2017-mild Candida esophagitis Colonoscopy 04/27/2017-melanosis, lipomatous ileocecal valve-tubular adenoma, 10 mm polyp at the transverse colon-tubular adenoma Capsule endoscopy 05/10/2018-single linear erosion at 1 hour 32 minutes, otherwise negative Colonoscopy 06/01/2018-polyps removed from the ileocecal valve and ascending colon Esophageal candidiasis 12/18/2022, completed treatment with fluconazole Addison's disease CKD CAD   Disposition: [name] has a history of iron deficiency anemia.  The hemoglobin has corrected into the normal range with iron replacement.  She has undergone a GI evaluation in the past.  She plans to follow-up with Dr. Leone Payor.  She says he is considering a repeat capsule endoscopy.  Kaitlyn Adams will continue iron.  She will return for an office visit and CBC in 6 months.  We will discharge her from the oncology clinic if the hemoglobin and ferritin remain normal when she is here in 6 months.  Thornton Papas, MD  04/11/2023  9:45 AM

## 2023-04-12 NOTE — Telephone Encounter (Signed)
Pt made aware of Dr. Leone Payor recommendations:  Pt was scheduled to see Dr. Leone Payor on 05/30/2023 at 9:10 AM. Pt made aware.  Pt verbalized understanding with all questions answered.

## 2023-04-13 ENCOUNTER — Other Ambulatory Visit: Payer: Medicare Other

## 2023-04-13 ENCOUNTER — Ambulatory Visit: Payer: Medicare Other | Admitting: Oncology

## 2023-04-13 DIAGNOSIS — M810 Age-related osteoporosis without current pathological fracture: Secondary | ICD-10-CM | POA: Diagnosis not present

## 2023-04-13 DIAGNOSIS — E039 Hypothyroidism, unspecified: Secondary | ICD-10-CM | POA: Diagnosis not present

## 2023-04-13 DIAGNOSIS — Z7952 Long term (current) use of systemic steroids: Secondary | ICD-10-CM | POA: Diagnosis not present

## 2023-04-13 DIAGNOSIS — E785 Hyperlipidemia, unspecified: Secondary | ICD-10-CM | POA: Diagnosis not present

## 2023-04-13 DIAGNOSIS — K219 Gastro-esophageal reflux disease without esophagitis: Secondary | ICD-10-CM | POA: Diagnosis not present

## 2023-04-13 DIAGNOSIS — E2740 Unspecified adrenocortical insufficiency: Secondary | ICD-10-CM | POA: Diagnosis not present

## 2023-04-13 DIAGNOSIS — M8589 Other specified disorders of bone density and structure, multiple sites: Secondary | ICD-10-CM | POA: Diagnosis not present

## 2023-04-13 DIAGNOSIS — E876 Hypokalemia: Secondary | ICD-10-CM | POA: Diagnosis not present

## 2023-04-13 DIAGNOSIS — M899 Disorder of bone, unspecified: Secondary | ICD-10-CM | POA: Diagnosis not present

## 2023-04-13 DIAGNOSIS — E559 Vitamin D deficiency, unspecified: Secondary | ICD-10-CM | POA: Diagnosis not present

## 2023-04-19 ENCOUNTER — Ambulatory Visit: Payer: Medicare Other | Admitting: Family Medicine

## 2023-04-19 ENCOUNTER — Ambulatory Visit (INDEPENDENT_AMBULATORY_CARE_PROVIDER_SITE_OTHER)
Admission: RE | Admit: 2023-04-19 | Discharge: 2023-04-19 | Disposition: A | Discharge status: Home / Self Care | Payer: Medicare Other | Source: Ambulatory Visit | Attending: Family Medicine | Admitting: Family Medicine

## 2023-04-19 ENCOUNTER — Encounter: Payer: Self-pay | Admitting: Family Medicine

## 2023-04-19 VITALS — BP 118/60 | HR 77 | Temp 98.2°F | Ht 63.0 in | Wt 102.4 lb

## 2023-04-19 DIAGNOSIS — J329 Chronic sinusitis, unspecified: Secondary | ICD-10-CM | POA: Diagnosis not present

## 2023-04-19 DIAGNOSIS — R519 Headache, unspecified: Secondary | ICD-10-CM | POA: Diagnosis not present

## 2023-04-19 DIAGNOSIS — R0989 Other specified symptoms and signs involving the circulatory and respiratory systems: Secondary | ICD-10-CM | POA: Diagnosis not present

## 2023-04-19 DIAGNOSIS — R059 Cough, unspecified: Secondary | ICD-10-CM | POA: Diagnosis not present

## 2023-04-19 DIAGNOSIS — R051 Acute cough: Secondary | ICD-10-CM | POA: Diagnosis not present

## 2023-04-19 LAB — POCT INFLUENZA A/B
Influenza A, POC: NEGATIVE
Influenza B, POC: NEGATIVE

## 2023-04-19 LAB — POC COVID19 BINAXNOW: SARS Coronavirus 2 Ag: NEGATIVE

## 2023-04-19 MED ORDER — AMOXICILLIN-POT CLAVULANATE 875-125 MG PO TABS: 1.0000 | ORAL_TABLET | Freq: Two times a day (BID) | ORAL | 0 refills | Status: DC

## 2023-04-19 NOTE — Patient Instructions (Signed)
As we discussed early symptoms could have been due to a virus but with the worsening symptoms, sinus issues and some possible congestion and the chest, I think it is reasonable to start Augmentin at this time.  Please have x-ray performed at the imaging location below.  If they do see a pneumonia and may need to add another medication.  COVID and flu test were negative/normal in the office today but I do recommend repeating the COVID test tomorrow and let me know if that is positive.  Fluids, rest, Delsym is fine or Mucinex over-the-counter if needed for cough, let me know if other medications needed.  Please let me know if there are questions and get better soon!  Return to the clinic or go to the nearest emergency room if any of your symptoms worsen or new symptoms occur.  [name] or xray: Walk in 8:30-4:30 during weekdays, no appointment needed [address]  [place], [place] [address]

## 2023-04-19 NOTE — Progress Notes (Signed)
Subjective:  Patient ID: Kaitlyn Adams, female    DOB: [birth year]  Age: 72 y.o. MRN: [id number]  CC:  Chief Complaint  Patient presents with   Nasal Congestion    Pt notes headache, sinus congestion, started yesterday with a cough, no tempeture, pt sxs notes started on Monday      HPI SKYLAN SAMPAT presents for   Sinus congestion Started to feel bad over the weekend, then frontal HA worsened 2 days ago. sinus congestion starting 2 days ago, cough yesterday.  No fever. No discolored nasal d/c. Sinus infection last winter. No known sick contacts.  Feels worse yesterday and today - more headache and fatigue. Eating and drinking ok. No confusion.  Tx: tylenol  No recent covid booster.  Immunization History  Administered Date(s) Administered   Fluad Quad(high Dose 65+) 10/02/2019   Influenza Split 09/11/2012   Influenza Whole 06/22/2009   Influenza, High Dose Seasonal PF 07/06/2017, 08/01/2018   Influenza-Unspecified 07/07/2021   PFIZER(Purple Top)SARS-COV-2 Vaccination 10/18/2019, 11/13/2019, 05/28/2020   Pneumococcal Conjugate-13 11/18/2015   Pneumococcal Polysaccharide-23 08/23/2003, 07/06/2017   Tdap 07/10/2012       History Patient Active Problem List   Diagnosis Date Noted   Frequent falls 02/07/2023   Candida esophagitis (HCC) 12/18/2022   Benign neoplasm of ascending colon 12/18/2022   Benign neoplasm of transverse colon 12/18/2022   Anal stricture 12/18/2022   CKD (chronic kidney disease) stage 3, GFR 30-59 ml/min (HCC) 12/17/2022   Adrenal insufficiency (HCC) 11/06/2020   Disorder of bone 11/06/2020   Disorder of calcium metabolism 11/06/2020   Vitamin D deficiency 11/06/2020   Hypokalemia 10/25/2018   Moderate protein-calorie malnutrition (HCC) 08/01/2018   Upper airway cough syndrome 05/22/2017   Symptomatic anemia 04/26/2017   Chronic constipation 07/07/2016   History of colonic polyps 07/07/2016   Chronic anticoagulation 07/07/2016   Iron  deficiency anemia 06/15/2016   Anxiety and depression 05/18/2016   Physical exam 11/18/2015   Pain in joint, ankle and foot 11/16/2015   PAT (paroxysmal atrial tachycardia) 09/08/2015   Hyperlipidemia 07/30/2015   CAD (coronary artery disease)    Chest pain at rest, not due to CAD, possible due to a fib. 05/15/2015   Chronic bronchitis (HCC) 06/17/2011   Orthostatic hypotension 12/06/2010   Paroxysmal atrial fibrillation (HCC)    Renal insufficiency    Myalgia    Hypothyroidism    Glaucoma    GERD, SEVERE 08/19/2010   Osteoarthritis 04/17/2008   FIBROMYALGIA 04/17/2008   Past Medical History:  Diagnosis Date   Addison disease (HCC)    Anemia    Asthma    - normal spirometry 2009 FEV1  >90% predicted.  - methacoline challenge neg 11/09   Bronchitis    recurrent   Cataract    Chronic cough    - sinus CT 03-23-10 > neg.  - allergy profile 03-23-10 > nl, IgE 14.  - flutter valve rx 03-23-10   Fibromyalgia    GERD (gastroesophageal reflux disease)    exacerbating VCD   Glaucoma    History of blood transfusion    Hyperlipidemia    Hypertension    Hyperthyroidism    with hot nodule.  - total thyroidectomy 11-05-08 benign -> subsequent hypothyroidism.   Hypokalemia    Hypothyroidism    Hypothyroidism    a. following thyroidectomy.   Mild CAD    a. LHC 04/2015 - mild nonobstructive CAD with 30% dLAD, 40% D1, 25% pLCx, 30% mRCA, 30% RPDA, normal EF >65%  with normal LVEDP.   Orthostatic hypotension    Paroxysmal atrial fibrillation (HCC)    Renal insufficiency    Vasomotor rhinitis    exacerbates VCD   Vocal cord dysfunction    proven on FOB 9/09   Past Surgical History:  Procedure Laterality Date   ABDOMINAL HYSTERECTOMY  1980   ABDOMINAL HYSTERECTOMY     BASAL CELL CARCINOMA EXCISION  11/08   BIOPSY  12/18/2022   Procedure: BIOPSY;  Surgeon: Iva Boop, MD;  Location: [facility];  Service: Gastroenterology;;   CARDIAC CATHETERIZATION N/A 05/15/2015   Procedure: Left  Heart Cath and Coronary Angiography;  Surgeon: Tonny Bollman, MD;  Location: [facility] INVASIVE CV LAB;  Service: Cardiovascular;  Laterality: N/A;   COLONOSCOPY N/A 04/27/2017   Dr Lavon Paganini for scant rectal bleeding and iron def anemia: Melanosis coli, lipomatous IC valve, left sided tics, non-bleeding hemorrhoid, 10 mm transverse polyp.     COLONOSCOPY WITH PROPOFOL N/A 12/18/2022   Procedure: COLONOSCOPY WITH PROPOFOL;  Surgeon: Iva Boop, MD;  Location: [facility];  Service: Gastroenterology;  Laterality: N/A;   ESOPHAGOGASTRODUODENOSCOPY N/A 04/27/2017   Napoleon Form, MD; [facility] ENDOSCOPY. for iron def anemia.  Monilial/candidial esophagitis. Small HH.     ESOPHAGOGASTRODUODENOSCOPY (EGD) WITH PROPOFOL N/A 12/18/2022   Procedure: ESOPHAGOGASTRODUODENOSCOPY (EGD) WITH PROPOFOL;  Surgeon: Iva Boop, MD;  Location: [facility];  Service: Gastroenterology;  Laterality: N/A;   EYE SURGERY     POLYPECTOMY  12/18/2022   Procedure: POLYPECTOMY;  Surgeon: Iva Boop, MD;  Location: [facility];  Service: Gastroenterology;;   scar tissue removal  1982   THYROID SURGERY  2010   THYROIDECTOMY  11-05-08   Allergies  Allergen Reactions   Fenofibrate Other (See Comments)    GI side effects    Erythromycin Nausea And Vomiting    REACTION: nausea and vomiting   Guaifenesin Er Palpitations   Sulfonamide Derivatives Nausea And Vomiting    REACTION: nausea and vomiting   Prior to Admission medications   Medication Sig Start Date End Date Taking? Authorizing Provider  albuterol (VENTOLIN HFA) 108 (90 Base) MCG/ACT inhaler INHALE 2 PUFFS INTO THE LUNGS EVERY 6 HOURS AS NEEDED FOR WHEEZING OR SHORTNESS OF BREATH 02/02/23  Yes Sheliah Hatch, MD  amitriptyline (ELAVIL) 50 MG tablet Take one tablet at bedtime 11/08/22  Yes Sheliah Hatch, MD  atorvastatin (LIPITOR) 20 MG tablet TAKE 1 TABLET(20 MG) BY MOUTH DAILY 04/03/23  Yes Sheliah Hatch, MD  digoxin (LANOXIN) 0.125 MG tablet  Take 1 tablet (0.125 mg total) by mouth daily. 09/14/22  Yes [place], Peter M, MD  diphenhydramine-acetaminophen (TYLENOL PM) 25-500 MG TABS tablet Take 1 tablet by mouth at bedtime as needed (pain).   Yes [provider]  donepezil (ARICEPT) 5 MG tablet Take 1 tablet (5 mg total) by mouth at bedtime. 03/13/23  Yes Sheliah Hatch, MD  famotidine (PEPCID) 40 MG tablet Take 40 mg by mouth daily as needed for heartburn or indigestion.   Yes [provider]  ferrous sulfate 325 (65 FE) MG EC tablet Take 1 tablet (325 mg total) by mouth daily with breakfast. 12/18/22  Yes Standley Brooking, MD  fludrocortisone (FLORINEF) 0.1 MG tablet TAKE 1 TABLET BY MOUTH DAILY 01/09/23  Yes Sheliah Hatch, MD  furosemide (LASIX) 20 MG tablet Take 1 tablet (20 mg total) by mouth daily as needed for edema. 12/06/22  Yes [place], Peter M, MD  hydrocortisone (CORTEF) 10 MG tablet Take  10 mg by mouth 2 (two) times daily. Patient reports taking BID 09/07/2022 09/30/20  Yes [provider]  hydroxypropyl methylcellulose / hypromellose (ISOPTO TEARS / GONIOVISC) 2.5 % ophthalmic solution Place 2 drops into both eyes daily as needed for dry eyes.   Yes [provider]  levothyroxine (SYNTHROID) 25 MCG tablet Take 25 mcg by mouth every morning. 08/16/22  Yes [provider]  naproxen (NAPROSYN) 500 MG tablet Take 1 tablet (500 mg total) by mouth 2 (two) times daily with a meal. 03/13/23  Yes Sheliah Hatch, MD  pantoprazole (PROTONIX) 40 MG tablet Take 1 tablet (40 mg total) by mouth 2 (two) times daily before a meal. 02/01/23  Yes Charlott Holler, MD  polyethylene glycol (MIRALAX) 17 g packet Take 17 g by mouth daily as needed for mild constipation. 12/18/22  Yes Standley Brooking, MD  potassium chloride SA (KLOR-CON M) 20 MEQ tablet Take 2 tablets (40 mEq total) by mouth daily. 12/06/22  Yes [place], Peter M, MD  sertraline (ZOLOFT) 25 MG tablet TAKE 1 TABLET(25 MG) BY MOUTH DAILY  02/27/23  Yes Sheliah Hatch, MD  ALPRAZolam Prudy Feeler) 0.25 MG tablet Take 1 tablet (0.25 mg total) by mouth 2 (two) times daily as needed for anxiety. Patient not taking: Reported on 04/11/2023 07/12/22   Sheliah Hatch, MD  fluticasone-salmeterol (ADVAIR [facility]) [phone] MCG/ACT inhaler Inhale 2 puffs into the lungs 2 (two) times daily. Patient not taking: Reported on 04/11/2023 04/10/23   Charlott Holler, MD   Social History   Socioeconomic History   Marital status: Married    Spouse name: Not on file   Number of children: 1   Years of education: Not on file   Highest education level: 12th grade  Occupational History   Occupation: owns a Museum/gallery curator   Occupation: OWNER    Employer: [employer]  Tobacco Use   Smoking status: Never   Smokeless tobacco: Never  Vaping Use   Vaping status: Never Used  Substance and Sexual Activity   Alcohol use: Yes    Alcohol/week: 1.0 standard drink of alcohol    Types: 1 Glasses of wine per week    Comment: occasional wine or beer   Drug use: No   Sexual activity: Yes    Birth control/protection: None  Other Topics Concern   Not on file  Social History Narrative   Not on file   Social Determinants of Health   Financial Resource Strain: Low Risk  (01/31/2023)   Overall Financial Resource Strain (CARDIA)    Difficulty of Paying Living Expenses: Not hard at all  Food Insecurity: No Food Insecurity (01/31/2023)   Hunger Vital Sign    Worried About Running Out of Food in the Last Year: Never true    Ran Out of Food in the Last Year: Never true  Transportation Needs: No Transportation Needs (01/31/2023)   PRAPARE - Administrator, Civil Service (Medical): No    Lack of Transportation (Non-Medical): No  Physical Activity: Inactive (01/31/2023)   Exercise Vital Sign    Days of Exercise per Week: 0 days    Minutes of Exercise per Session: 0 min  Stress: No Stress Concern Present (01/31/2023)   [employer] of  Occupational Health - Occupational Stress Questionnaire    Feeling of Stress : Only a little  Social Connections: Moderately Isolated (01/31/2023)   Social Connection and Isolation Panel [NHANES]    Frequency of Communication with  Friends and Family: Once a week    Frequency of Social Gatherings with Friends and Family: Once a week    Attends Religious Services: 1 to 4 times per year    Active Member of [employer] or Organizations: No    Attends Banker Meetings: Never    Marital Status: Married  Catering manager Violence: Not At Risk (01/31/2023)   Humiliation, Afraid, Rape, and Kick questionnaire    Fear of Current or Ex-Partner: No    Emotionally Abused: No    Physically Abused: No    Sexually Abused: No    Review of Systems  Per HPI.  Objective:   Vitals:   04/19/23 1047  BP: 118/60  Pulse: 77  Temp: 98.2 F (36.8 C)  TempSrc: Temporal  SpO2: 96%  Weight: 102 lb 6.4 oz (46.4 kg)  Height: 5\' 3"  (1.6 m)     Physical Exam Vitals reviewed.  Constitutional:      General: She is not in acute distress.    Appearance: She is well-developed.  HENT:     Head: Normocephalic and atraumatic.     Right Ear: Hearing and external ear normal.     Left Ear: Hearing, tympanic membrane, ear canal and external ear normal.     Ears:     Comments: Cerumen in right canal, unable to completely visualize TM but does not appear completely obstructed.    Nose: Nose normal.     Comments: Bilateral frontal greater than maxillary sinus tenderness.    Mouth/Throat:     Pharynx: No oropharyngeal exudate.  Eyes:     Conjunctiva/sclera: Conjunctivae normal.     Pupils: Pupils are equal, round, and reactive to light.  Cardiovascular:     Rate and Rhythm: Normal rate and regular rhythm.     Heart sounds: Normal heart sounds. No murmur heard. Pulmonary:     Effort: Pulmonary effort is normal. No respiratory distress.     Breath sounds: Rhonchi (few faint coarse breath sounds LLL, no  wheeze, no distress.) present. No wheezing.  Skin:    General: Skin is warm and dry.     Findings: No rash.  Neurological:     Mental Status: She is alert and oriented to person, place, and time.  Psychiatric:        Behavior: Behavior normal.      Results for orders placed or performed in visit on 04/19/23  POC COVID-19 BinaxNow  Result Value Ref Range   SARS Coronavirus 2 Ag Negative Negative  POCT Influenza A/B  Result Value Ref Range   Influenza A, POC Negative Negative   Influenza B, POC Negative Negative     Assessment & Plan:  BRION MARTORANA is a 72 y.o. female . Acute cough - Plan: POC COVID-19 BinaxNow, POCT Influenza A/B, DG Chest 2 View, amoxicillin-clavulanate (AUGMENTIN) 875-125 MG tablet  Sinusitis, unspecified chronicity, unspecified location - Plan: amoxicillin-clavulanate (AUGMENTIN) 875-125 MG tablet Possible initial viral illness with progression to lower respiratory infection, cough, sinusitis.  Check chest x-ray.  Recommended repeat COVID testing tomorrow.  Start Augmentin.  Symptomatic care discussed with RTC/urgent care/ER precautions given.  We also discussed risk of antibiotic, all questions answered.   Meds ordered this encounter  Medications   amoxicillin-clavulanate (AUGMENTIN) 875-125 MG tablet    Sig: Take 1 tablet by mouth 2 (two) times daily.    Dispense:  20 tablet    Refill:  0   Patient Instructions  As we discussed early symptoms could  have been due to a virus but with the worsening symptoms, sinus issues and some possible congestion and the chest, I think it is reasonable to start Augmentin at this time.  Please have x-ray performed at the imaging location below.  If they do see a pneumonia and may need to add another medication.  COVID and flu test were negative/normal in the office today but I do recommend repeating the COVID test tomorrow and let me know if that is positive.  Fluids, rest, Delsym is fine or Mucinex over-the-counter if  needed for cough, let me know if other medications needed.  Please let me know if there are questions and get better soon!  Return to the clinic or go to the nearest emergency room if any of your symptoms worsen or new symptoms occur.  [name] or xray: Walk in 8:30-4:30 during weekdays, no appointment needed [address]  [place], [place] [address]     Signed,   Meredith Staggers, MD [facility], [facility] 04/19/23 11:26 AM

## 2023-04-25 ENCOUNTER — Other Ambulatory Visit: Payer: Self-pay | Admitting: Family Medicine

## 2023-04-29 ENCOUNTER — Other Ambulatory Visit: Payer: Self-pay | Admitting: Family Medicine

## 2023-04-29 DIAGNOSIS — K5909 Other constipation: Secondary | ICD-10-CM

## 2023-05-01 ENCOUNTER — Encounter: Payer: Self-pay | Admitting: Family Medicine

## 2023-05-01 ENCOUNTER — Ambulatory Visit (INDEPENDENT_AMBULATORY_CARE_PROVIDER_SITE_OTHER): Payer: Medicare Other | Admitting: Family Medicine

## 2023-05-01 VITALS — BP 118/82 | HR 82 | Temp 97.8°F | Resp 18 | Wt 103.5 lb

## 2023-05-01 DIAGNOSIS — E039 Hypothyroidism, unspecified: Secondary | ICD-10-CM | POA: Diagnosis not present

## 2023-05-01 DIAGNOSIS — J329 Chronic sinusitis, unspecified: Secondary | ICD-10-CM | POA: Diagnosis not present

## 2023-05-01 DIAGNOSIS — M898X9 Other specified disorders of bone, unspecified site: Secondary | ICD-10-CM | POA: Diagnosis not present

## 2023-05-01 DIAGNOSIS — E559 Vitamin D deficiency, unspecified: Secondary | ICD-10-CM | POA: Diagnosis not present

## 2023-05-01 LAB — CBC WITH DIFFERENTIAL/PLATELET
Basophils Absolute: 0 10*3/uL (ref 0.0–0.1)
Basophils Relative: 0.4 % (ref 0.0–3.0)
Eosinophils Absolute: 0 10*3/uL (ref 0.0–0.7)
Eosinophils Relative: 0.6 % (ref 0.0–5.0)
HCT: 38.9 % (ref 36.0–46.0)
Hemoglobin: 12.2 g/dL (ref 12.0–15.0)
Lymphocytes Relative: 21 % (ref 12.0–46.0)
Lymphs Abs: 1.6 10*3/uL (ref 0.7–4.0)
MCHC: 31.4 g/dL (ref 30.0–36.0)
MCV: 86 fl (ref 78.0–100.0)
Monocytes Absolute: 0.6 10*3/uL (ref 0.1–1.0)
Monocytes Relative: 7.9 % (ref 3.0–12.0)
Neutro Abs: 5.4 10*3/uL (ref 1.4–7.7)
Neutrophils Relative %: 70.1 % (ref 43.0–77.0)
Platelets: 433 10*3/uL — ABNORMAL HIGH (ref 150.0–400.0)
RBC: 4.52 Mil/uL (ref 3.87–5.11)
RDW: 15.7 % — ABNORMAL HIGH (ref 11.5–15.5)
WBC: 7.7 10*3/uL (ref 4.0–10.5)

## 2023-05-01 LAB — BASIC METABOLIC PANEL
BUN: 18 mg/dL (ref 6–23)
CO2: 23 meq/L (ref 19–32)
Calcium: 9.9 mg/dL (ref 8.4–10.5)
Chloride: 107 meq/L (ref 96–112)
Creatinine, Ser: 1.2 mg/dL (ref 0.40–1.20)
GFR: 44.87 mL/min — ABNORMAL LOW (ref >60.00)
Glucose, Bld: 93 mg/dL (ref 70–99)
Potassium: 4.4 meq/L (ref 3.5–5.1)
Sodium: 135 meq/L (ref 135–145)

## 2023-05-01 LAB — HEPATIC FUNCTION PANEL
ALT: 13 U/L (ref 0–35)
AST: 15 U/L (ref 0–37)
Albumin: 3.7 g/dL (ref 3.5–5.2)
Alkaline Phosphatase: 72 U/L (ref 39–117)
Bilirubin, Direct: 0.1 mg/dL (ref 0.0–0.3)
Total Bilirubin: 0.3 mg/dL (ref 0.2–1.2)
Total Protein: 7.4 g/dL (ref 6.0–8.3)

## 2023-05-01 LAB — TSH: TSH: 2.99 u[IU]/mL (ref 0.35–5.50)

## 2023-05-01 LAB — VITAMIN D 25 HYDROXY (VIT D DEFICIENCY, FRACTURES): VITD: 37.96 ng/mL (ref 30.00–100.00)

## 2023-05-01 MED ORDER — CETIRIZINE HCL 10 MG PO TABS: 10.0000 mg | ORAL_TABLET | Freq: Every day | ORAL | 11 refills | Status: DC

## 2023-05-01 MED ORDER — PREDNISONE 10 MG PO TABS: ORAL_TABLET | ORAL | 0 refills | Status: DC

## 2023-05-01 NOTE — Progress Notes (Signed)
   Subjective:    Patient ID: Kaitlyn Adams, female    DOB: [birth year], 72 y.o.   MRN: [id number]  HPI R leg pain- 'it just hurts all the time'.  Pain is preventing her from sleep.  Has tried Tylenol and ibuprofen w/o relief.  Pain extends from hip to ankle.  Will have occasional knee swelling.  Sxs started Thursday- this is day 5 and pain has been constant.  Prior to pain, she doesn't recall a specific injury or change in routine.  Has pain w/ palpation.  Pt feels pain is more bone than muscle.  Pt just recently started taking Ca and Vit D  Sinusitis- pt reports that sxs had resolved after last visit but 'as of this morning' she is coughing.  Having frontal sinus pain.  + HA.  + nasal congestion.  Not currently on Claritin or Zyrtec.   Review of Systems For ROS see HPI     Objective:   Physical Exam Vitals reviewed.  Constitutional:      General: She is not in acute distress.    Appearance: Normal appearance. She is not ill-appearing.  HENT:     Head: Normocephalic and atraumatic.     Right Ear: Tympanic membrane and ear canal normal.     Left Ear: Tympanic membrane and ear canal normal.     Nose: Nose normal. No congestion or rhinorrhea.     Mouth/Throat:     Mouth: Mucous membranes are moist.     Pharynx: Oropharynx is clear. No oropharyngeal exudate or posterior oropharyngeal erythema.  Eyes:     Extraocular Movements: Extraocular movements intact.     Conjunctiva/sclera: Conjunctivae normal.  Musculoskeletal:        General: Tenderness (TTP of R femur, anterior knee, and tibia) present. No swelling or deformity.     Cervical back: Neck supple.     Right lower leg: No edema.  Lymphadenopathy:     Cervical: No cervical adenopathy.  Skin:    General: Skin is warm and dry.     Findings: No erythema or rash.  Neurological:     Mental Status: She is alert. Mental status is at baseline.     Cranial Nerves: No cranial nerve deficit.     Motor: No weakness.     Gait: Gait  normal.           Assessment & Plan:  Bone pain- new.  Pt reports sxs started 5 days ago.  Denies change in activity level or known injury.  She doesn't feel like it is muscular- 'only hurts when you press on the bones and at night'  She is unable to sleep due to severity of pain.  No relief w/ tylenol or OTC NSAIDs.  No hx of similar.  Will check labs to r/o thyroid issue, electrolyte disturbance (particularly a calcium issue).  Start Prednisone taper to help w/ pain.  If no relief, will need to consider Tramadol and ortho referral.  Pt expressed understanding and is in agreement w/ plan.   Sinusitis- new.  Pt's sxs just started this morning.  She is not taking any regular allergy medication and pollen counts are very high.  Encouraged daily antihistamine- this was sent to pharmacy.  The Prednisone for her leg pain will also treat any sinus inflammation.  Reviewed supportive care and red flags that should prompt return.  Pt expressed understanding and is in agreement w/ plan.

## 2023-05-01 NOTE — Patient Instructions (Signed)
Follow up as needed or as scheduled We'll notify you of your lab results and make any changes if needed START the Prednisone as directed- 3 pills at the same time x3 days, then 2 pills at the same time x3 days, then 1 pill daily.  Take w/ food  USE Tylenol as needed for additional pain relief AVOID ibuprofen, naproxen, etc while on the prednisone ADD the Cetirizine (Zyrtec) daily to help w/ inflammation of sinuses Call with any questions or concerns Hang in there!!

## 2023-05-02 ENCOUNTER — Telehealth: Payer: Self-pay

## 2023-05-02 NOTE — Telephone Encounter (Signed)
Pt is aware of the lab results  °

## 2023-05-02 NOTE — Telephone Encounter (Signed)
-----   Message from Neena Rhymes sent at 05/02/2023  7:19 AM EDT ----- Labs look great!  No changes at this time

## 2023-05-29 DIAGNOSIS — H40013 Open angle with borderline findings, low risk, bilateral: Secondary | ICD-10-CM | POA: Diagnosis not present

## 2023-05-29 DIAGNOSIS — H04123 Dry eye syndrome of bilateral lacrimal glands: Secondary | ICD-10-CM | POA: Diagnosis not present

## 2023-05-30 ENCOUNTER — Encounter: Payer: Self-pay | Admitting: Internal Medicine

## 2023-05-30 ENCOUNTER — Ambulatory Visit (INDEPENDENT_AMBULATORY_CARE_PROVIDER_SITE_OTHER): Payer: Medicare Other | Admitting: Internal Medicine

## 2023-05-30 VITALS — BP 92/68 | HR 88 | Ht 63.0 in | Wt 97.1 lb

## 2023-05-30 DIAGNOSIS — D509 Iron deficiency anemia, unspecified: Secondary | ICD-10-CM | POA: Diagnosis not present

## 2023-05-30 DIAGNOSIS — R195 Other fecal abnormalities: Secondary | ICD-10-CM

## 2023-05-30 DIAGNOSIS — R112 Nausea with vomiting, unspecified: Secondary | ICD-10-CM | POA: Diagnosis not present

## 2023-05-30 NOTE — Progress Notes (Signed)
Kaitlyn Adams 72 y.o. September 27, 1950 [id number]  Assessment & Plan:   Encounter Diagnoses  Name Primary?   Iron deficiency anemia, unspecified iron deficiency anemia type Yes   Heme + stool    Nausea and vomiting, unspecified vomiting type     With respect to her iron deficiency anemia we will do a capsule endoscopy again to see if she has AVMs or some other treatable cause.  I reviewed the rare but real risk of capsule retention though I think that would then be revealing a problem that needed correction.  I think it is incredibly unlikely that we would see that.  She does use some Naprosyn but it sounds intermittent.  This certainly could be contributing to iron deficiency anemia.  Will see what the capsule endoscopy shows.  Heme positive stool may be related to her hemorrhoids and anal stricture issues.  Could be from the small bowel as well.  EGD and colonoscopy in the spring of this year were unrevealing with respect to the iron deficiency anemia.  Recent nausea and vomiting, seems to be resolving.  The patient and her husband give a history of having some episodes off and on over the years and this sounds like one of them.  Supportive care.  Gradually advance diet.    Subjective:   Chief Complaint: Iron deficiency anemia  HPI 72 year old white woman with a history of chronic recurrent iron deficiency anemia presents with her husband.  Other medical problems include Addison's disease, fibromyalgia, paroxysmal atrial fibrillation, and she takes Aricept.  No formal diagnosis of dementia on the chart.  I had seen her in the spring when she was hospitalized with a recurrent iron deficiency anemia in the setting of nonadherence to iron therapy, see the summarization below.  She has been seeing Dr. Truett Perna, she had Hemoccult positive stool x 3 in June, and there has been question about repeating a capsule endoscopy to try to find an identifiable cause of her iron deficiency anemia.   She does not see any rectal bleeding.  She is on oral iron therapy and has received parenteral iron therapy as well.  Last visit with Dr. Truett Perna was in August and I reviewed that.  In addition , Kaitlyn Adams recently experienced a bout of vomiting over several days, which started with nausea and progressed to vomiting green fluid. She reported no associated diarrhea but did experience stomach pain. She denied any associated headaches, dizziness, or vertigo. The vomiting episode seemed to be resolving at the time of the visit.  Kaitlyn Adams medication regimen includes Pepcid for heartburn, Naprosyn for discomfort, Protonix for reflux, and flucocortisone and hydrocortisone for blood pressure management. She reported taking Naprosyn only when symptoms were acting up. She had previously been on omeprazole, but this was discontinued.    Latest Ref Rng & Units 05/01/2023    1:01 PM 04/11/2023    9:15 AM 03/01/2023    9:12 AM  CBC  WBC 4.0 - 10.5 K/uL 7.7  5.7  8.9   Hemoglobin 12.0 - 15.0 g/dL 40.9  81.1  91.4   Hematocrit 36.0 - 46.0 % 38.9  44.8  34.6   Platelets 150.0 - 400.0 K/uL 433.0  368  500    Lab Results  Component Value Date   FERRITIN 20 04/11/2023    FOBT + x 3 6/24  Iron deficiency anemia 12/16/2022 hemoglobin 5.6, MCV 68, ferritin 3 12/17/2022 2 units red blood cells 12/17/2022 ferrous gluconate 125 mg IV 12/18/2022 upper endoscopy-esophageal plaques  were found secondary to candidiasis, examination otherwise normal 12/18/2022 colonoscopy-post polypectomy scar at the ileocecal valve, melanosis in the colon, anal stricture and nonthrombosed external hemorrhoids found on digital rectal exam, 2 polyps in the distal transverse colon and distal ascending colon.  Tubular adenoma 02/03/2023 stool positive for occult blood x 3 02/03/2023 urine negative for blood Hospitalization with severe microcytic anemia 12/16/2022 Hospitalization September 2018 with severe microcytic anemia 04/25/2017 hemoglobin 5.0,  MCV 53, ferritin 2 04/26/2017 stool positive for occult blood Upper endoscopy 04/27/2017-mild Candida esophagitis Colonoscopy 04/27/2017-melanosis, lipomatous ileocecal valve-tubular adenoma, 10 mm polyp at the transverse colon-tubular adenoma Capsule endoscopy 05/10/2017-single linear erosion at 1 hour 32 minutes, otherwise negative Colonoscopy 04/27/2017-polyps removed from the ileocecal valve and ascending colon       EGD 4/24 candida duod bx NL Colonoscopy 424 - Anal stricture and non-thrombosed external hemorrhoids found on digital rectal exam. Benign stenosis and stricture - dilated with 5th digit today - Two diminutive polyps in the distal transverse colon and in the distal ascending colon, removed with a cold snare. Resected and retrieved. 1 adenoma and 1 inflammatory polyp - Post-polypectomy scar at the ileocecal valve. - Melanosis in the colon. - The examination was otherwise normal on direct and retroflexion views. - Personal history of colonic polyps. 2018 10 mm adenoma + 18 mm IC valve adenoma removed ([facility], respectively)  2018 Capsule - single sb erosion        Allergies  Allergen Reactions   Fenofibrate Other (See Comments)    GI side effects    Erythromycin Nausea And Vomiting    REACTION: nausea and vomiting   Guaifenesin Er Palpitations   Sulfonamide Derivatives Nausea And Vomiting    REACTION: nausea and vomiting   Current Meds  Medication Sig   ADVAIR HFA 115-21 MCG/ACT inhaler Inhale 2 puffs into the lungs 2 (two) times daily.   albuterol (VENTOLIN HFA) 108 (90 Base) MCG/ACT inhaler INHALE 2 PUFFS INTO THE LUNGS EVERY 6 HOURS AS NEEDED FOR WHEEZING OR SHORTNESS OF BREATH   ALPRAZolam (XANAX) 0.25 MG tablet Take 1 tablet (0.25 mg total) by mouth 2 (two) times daily as needed for anxiety.   amitriptyline (ELAVIL) 50 MG tablet TAKE 1 TABLET BY MOUTH AT BEDTIME   atorvastatin (LIPITOR) 20 MG tablet TAKE 1 TABLET(20 MG) BY MOUTH DAILY   cetirizine (ZYRTEC)  10 MG tablet Take 1 tablet (10 mg total) by mouth daily.   digoxin (LANOXIN) 0.125 MG tablet Take 1 tablet (0.125 mg total) by mouth daily.   diphenhydramine-acetaminophen (TYLENOL PM) 25-500 MG TABS tablet Take 1 tablet by mouth at bedtime as needed (pain).   donepezil (ARICEPT) 5 MG tablet Take 1 tablet (5 mg total) by mouth at bedtime.   famotidine (PEPCID) 40 MG tablet Take 40 mg by mouth daily as needed for heartburn or indigestion.   ferrous sulfate 325 (65 FE) MG EC tablet Take 1 tablet (325 mg total) by mouth daily with breakfast.   fludrocortisone (FLORINEF) 0.1 MG tablet TAKE 1 TABLET BY MOUTH DAILY   furosemide (LASIX) 20 MG tablet Take 1 tablet (20 mg total) by mouth daily as needed for edema.   hydrocortisone (CORTEF) 10 MG tablet Take 10 mg by mouth 2 (two) times daily. Patient reports taking BID 09/07/2022   hydroxypropyl methylcellulose / hypromellose (ISOPTO TEARS / GONIOVISC) 2.5 % ophthalmic solution Place 2 drops into both eyes daily as needed for dry eyes.   levothyroxine (SYNTHROID) 25 MCG tablet Take 25 mcg by  mouth every morning.   naproxen (NAPROSYN) 500 MG tablet TAKE 1 TABLET(500 MG) BY MOUTH TWICE DAILY WITH A MEAL   pantoprazole (PROTONIX) 40 MG tablet Take 1 tablet (40 mg total) by mouth 2 (two) times daily before a meal.   polyethylene glycol (MIRALAX) 17 g packet Take 17 g by mouth daily as needed for mild constipation.   potassium chloride SA (KLOR-CON M) 20 MEQ tablet Take 2 tablets (40 mEq total) by mouth daily.   [DISCONTINUED] omeprazole (PRILOSEC) 40 MG capsule Take 40 mg by mouth daily.   Past Medical History:  Diagnosis Date   Addison disease (HCC)    Anemia    Asthma    - normal spirometry 2009 FEV1  >90% predicted.  - methacoline challenge neg 11/09   Bronchitis    recurrent   Cataract    Chronic cough    - sinus CT 03-23-10 > neg.  - allergy profile 03-23-10 > nl, IgE 14.  - flutter valve rx 03-23-10   Fibromyalgia    GERD (gastroesophageal reflux  disease)    exacerbating VCD   Glaucoma    History of blood transfusion    Hyperlipidemia    Hypertension    Hyperthyroidism    with hot nodule.  - total thyroidectomy 11-05-08 benign -> subsequent hypothyroidism.   Hypokalemia    Hypothyroidism    Hypothyroidism    a. following thyroidectomy.   Mild CAD    a. LHC 04/2015 - mild nonobstructive CAD with 30% dLAD, 40% D1, 25% pLCx, 30% mRCA, 30% RPDA, normal EF >65% with normal LVEDP.   Orthostatic hypotension    Paroxysmal atrial fibrillation (HCC)    Renal insufficiency    Vasomotor rhinitis    exacerbates VCD   Vocal cord dysfunction    proven on FOB 9/09   Past Surgical History:  Procedure Laterality Date   ABDOMINAL HYSTERECTOMY  1980   ABDOMINAL HYSTERECTOMY     BASAL CELL CARCINOMA EXCISION  11/08   BIOPSY  12/18/2022   Procedure: BIOPSY;  Surgeon: Iva Boop, MD;  Location: [facility];  Service: Gastroenterology;;   CARDIAC CATHETERIZATION N/A 05/15/2015   Procedure: Left Heart Cath and Coronary Angiography;  Surgeon: Tonny Bollman, MD;  Location: [facility] INVASIVE CV LAB;  Service: Cardiovascular;  Laterality: N/A;   COLONOSCOPY N/A 04/27/2017   Dr Lavon Paganini for scant rectal bleeding and iron def anemia: Melanosis coli, lipomatous IC valve, left sided tics, non-bleeding hemorrhoid, 10 mm transverse polyp.     COLONOSCOPY WITH PROPOFOL N/A 12/18/2022   Procedure: COLONOSCOPY WITH PROPOFOL;  Surgeon: Iva Boop, MD;  Location: [facility] ENDOSCOPY;  Service: Gastroenterology;  Laterality: N/A;   ESOPHAGOGASTRODUODENOSCOPY N/A 04/27/2017   Napoleon Form, MD; [facility] ENDOSCOPY. for iron def anemia.  Monilial/candidial esophagitis. Small HH.     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   Review of Systems See HPI  Objective:   Physical Exam BP 92/68 (BP Location: Left Arm, Patient Position: Sitting, Cuff Size: Normal)   Pulse 88   Ht 5\' 3"  (1.6 m) Comment: height measured without shoes  Wt 97 lb 2 oz (44.1 kg)   BMI 17.20 kg/m

## 2023-05-30 NOTE — Patient Instructions (Addendum)
CAPSULE ENDOSCOPY PATIENT INSTRUCTION SHEET  Kaitlyn Adams 02/20/1951 [id number]   06/13/2023 Seven (7) days prior to capsule endoscopy stop taking iron supplements and carafate.  06/18/2023 Two (2) days prior to capsule endoscopy stop taking aspirin or any arthritis drugs.  06/19/2023 Day before capsule endoscopy purchase a 238 gram bottle of Miralax from the laxative section of your drug store, and a 32 oz. bottle of Gatorade (no red).    06/19/2023 One (1) day prior to capsule endoscopy: Stop smoking. Eat a regular diet until 12:00 Noon. After 12:00 Noon take only the following: Black coffee  Jell-O (no fruit or red Jell-o) Water   Bouillon (chicken or beef) 7-Up   Cranberry Juice Tea   Kool-Aid Popsicle (not red) Sprite   Coke Ginger Ale  Pepsi Mountain Dew Gatorade At 6:00 pm the evening before your appointment, drink 7 capfuls (105 grams) of Miralax with 32 oz. Gatorade. Drink 8 oz every 15 minutes until gone. Nothing to eat or drink after midnight except medications with a sip of water.  06/20/2023 Day of capsule endoscopy:  No medications for 2 hours prior to your test.  Please arrive at [facility]  3rd floor patient registration area by 8:30am on: 06/20/23.   For any questions: Call [facility] at [phone] and ask to speak with one of the capsule endoscopy nurses.  YOU WILL NEED TO RETURN THE EQUIPMENT AT 4 PM ON THE DAY OF THE PROCEDURE.  PLEASE KEEP THIS IN MIND WHEN SCHEDULING.   The above instructions have been reviewed and explained to me by________________   Patient signature:_________________________________________     Date:________________  Small Bowel Capsule Endoscopy  What you should know: Small Bowel capsule endoscopy is a procedure that takes pictures of the inside of your small intestine (bowel).  Your small bowel connects to your stomach on one end, and your large bowel (colon) on the other.  A capsule endoscopy is done by  swallowing a pill size camera.  The capsule moves through your stomach and into your small bowel, where pictures are taken.   You may need a small bowel capsule endoscopy if you have symptoms, such as blood in your stool, chronic stomach pain, and diarrhea.  The pictures may show if you have growths, swelling, and bleeding area in you small bowel.  A capsule endoscopy may also show if diseases such as Crohn's or celiac disease are causing your symptoms.  Having a small bowel capsule endoscopy may help you and your caregiver learn the cause of your symptoms.  Learning what is causing your symptoms allows you to receive needed treatment and prevent further problems. Risks: You may have stomach pain during your procedure.   The pictures taken by the capsule may not be clear.   The pictures may not show the cause of your symptoms.   You may need another endoscopy procedure.   The capsule may get trapped in your esophagus or intestines. You may need surgery or additional procedures to remove the capsule from your body.    Before your procedure: You will be instructed to stop certain prescription medications or over- the -counter medications prior to the procedure.   The day before your scheduled appointment you will need to be on a restricted diet and will need to drink a bowel prep that will clean out your bowels.   The day of the procedure: You may drive yourself to the procedure.   You will need to plan on 2 trips to  the office on the day of the procedure. Morning: Plan to be at the office about 45 minutes. The morning of the procedure a sensor belt and recorder will be placed on you.  You will wear this for 8 hours.  (The sensor belt transfers pictures of your small bowel to the recorder.)   You will be given a pill-sized capsule endoscope to swallow.  Once you swallow the capsule it will travel through your body the same way food does, constantly taking pictures along the way.  The capsule takes  2-3 pictures a second.   Once you have left the office you may go about your normal day with a few exceptions: You may not go near a MRI machine or a radio or television towers; You need to avoid other patients having capsule endoscopy; You will be given a written diet to follow for the day.  Afternoon: You will need to be return to the office at your designated time. The sensors belt will be removed You will need to be at the office about 15 minutes.     ADVANCE YOUR DIET AS TOLERATED NOW.  I appreciate the opportunity to care for you. Stan Head, MD, [facility]

## 2023-06-07 DIAGNOSIS — M810 Age-related osteoporosis without current pathological fracture: Secondary | ICD-10-CM | POA: Diagnosis not present

## 2023-06-13 ENCOUNTER — Other Ambulatory Visit: Payer: Self-pay | Admitting: Family Medicine

## 2023-06-13 DIAGNOSIS — E039 Hypothyroidism, unspecified: Secondary | ICD-10-CM | POA: Diagnosis not present

## 2023-06-14 ENCOUNTER — Other Ambulatory Visit: Payer: Self-pay | Admitting: Family Medicine

## 2023-06-20 ENCOUNTER — Ambulatory Visit: Payer: Medicare Other | Admitting: Internal Medicine

## 2023-06-20 DIAGNOSIS — R195 Other fecal abnormalities: Secondary | ICD-10-CM | POA: Diagnosis not present

## 2023-06-20 DIAGNOSIS — Z538 Procedure and treatment not carried out for other reasons: Secondary | ICD-10-CM | POA: Diagnosis not present

## 2023-06-20 DIAGNOSIS — D509 Iron deficiency anemia, unspecified: Secondary | ICD-10-CM

## 2023-06-20 NOTE — Progress Notes (Signed)
Contact our office immediately at [phone] if you suffer from any abdominal pain, nausea, or vomiting during capsule endoscopy. a) Do not eat or drink for at least 2 hours. After 2 hours you may have any of the following to drink: Water   White grape juice 7-Up   Chicken Bouillon Sprite   Ginger Ale c) After 4 hours you may have a light snack to include any of the following: A cup of soup   sandwich Bowl of cereal  Rice Toast   Eggs 2-3 small cookies (i.e. vanilla wafers or graham crackers) d) After 8 hours you may return to your regular diet. During your procedure do not go near anyone else that is having capsule endoscopy. Do not be in close contact with an MRI machine or a radio or television tower. Do not wear a heavy coat or sweater because your recorder may over heat and stop recording.   Do not disconnect the equipment or remove the belt at any time.  Since the Data Recorder is actually a small computer, it should be treated with utmost care and protection.  Avoid sudden movement and banging of the Data Recorder.  Do not do any heavy lifting or strenuous physical activity during the test especially if it involves sweating and do not bend over or stoop during capsule endoscopy. During capsule endoscopy, you will need to verify every 15 minutes that the small light on top of the Data Recorder is blinking twice per second.  If for some reason it stops blinking at this site, record the time and contact our office at [phone].   Patient Name @name ID No: @MRN   Time Event (eating, drinking, activity and unusual sensations)                         Who to call in case of need: Time to return to facility: 4:00     Special Instructions:                          

## 2023-06-20 NOTE — Progress Notes (Signed)
SN: [id number] Exp: 07/12/2024 LOT: [id number]  Patient arrived for VCE. Reported the prep went well. This RN explained capsule dietary restrictions for the next few hours. Pt advised to return at 4 pm to return capsule equipment.  Patient verbalized understanding. Opened capsule, ensured capsule was flashing prior to the patient swallowing the capsule. Patient swallowed capsule without difficulty.  Patient told to call the office with any questions and if capsule has not passed after 72 hours. No further questions by the conclusion of the visit.

## 2023-06-21 ENCOUNTER — Telehealth: Payer: Self-pay | Admitting: Family Medicine

## 2023-06-28 ENCOUNTER — Other Ambulatory Visit: Payer: Self-pay | Admitting: Internal Medicine

## 2023-06-28 MED ORDER — LEVOCETIRIZINE DIHYDROCHLORIDE 5 MG PO TABS: 5.0000 mg | ORAL_TABLET | Freq: Every evening | ORAL | 11 refills | Status: DC

## 2023-06-29 ENCOUNTER — Telehealth: Payer: Self-pay | Admitting: Internal Medicine

## 2023-06-29 NOTE — Telephone Encounter (Signed)
Please call patient and explain the following:  1) No bleeding lesions seen on capsule study though it was limited due to intestinal secretions obscuring some of the view.   2) Ask if she has seen the capsule come out with bowel movement - if she has not need to order a KUB to see if it is still in the intestine  Document and let me know, please

## 2023-06-30 NOTE — Telephone Encounter (Signed)
Left message for pt to call back  °

## 2023-06-30 NOTE — Telephone Encounter (Signed)
Pt made aware of recent results and Dr. Leone Payor recommendations: Pt stated that she did see the capsule pass and that she is not have any abdominal pain or anything.  Routed as [employer]

## 2023-07-05 ENCOUNTER — Encounter: Payer: Self-pay | Admitting: Internal Medicine

## 2023-07-05 ENCOUNTER — Ambulatory Visit (INDEPENDENT_AMBULATORY_CARE_PROVIDER_SITE_OTHER): Payer: Medicare Other | Admitting: Internal Medicine

## 2023-07-05 VITALS — BP 120/80 | HR 90 | Ht 63.0 in | Wt 101.2 lb

## 2023-07-05 DIAGNOSIS — K219 Gastro-esophageal reflux disease without esophagitis: Secondary | ICD-10-CM

## 2023-07-05 DIAGNOSIS — J3089 Other allergic rhinitis: Secondary | ICD-10-CM | POA: Diagnosis not present

## 2023-07-05 DIAGNOSIS — J4540 Moderate persistent asthma, uncomplicated: Secondary | ICD-10-CM

## 2023-07-05 DIAGNOSIS — Z23 Encounter for immunization: Secondary | ICD-10-CM | POA: Diagnosis not present

## 2023-07-05 NOTE — Progress Notes (Signed)
Kaitlyn Adams    [id number]    [birth year]  Primary Care Physician:Tabori, Helane Rima, MD Date of Appointment: 07/05/2023 Established Patient Visit  Chief complaint:   Chief Complaint  Patient presents with   Follow-up    Asthma F/U visit.     HPI: Kaitlyn Adams is a 72 y.o. woman with chronic cough and mild persistent asthma .  Interval Updates: Here for follow up after increasing therapy to advair. Feels improvement.    Current Regimen: advair 115-21 BID, albuterol use is less than once/week. Asthma Triggers: humidity, URI Exacerbations in the last year: one History of hospitalization or intubation:never Allergy Testing: never had.  GERD: controlled on pantoprazole.  Allergic Rhinitis: yes controlled on antihistamine xyzal daily ACT:  Asthma Control Test ACT Total Score  07/05/2023  8:57 AM 18  04/03/2023  9:14 AM 18   FeNO: unable to complete maneuver.   I have reviewed the patient's family social and past medical history and updated as appropriate.   Past Medical History:  Diagnosis Date   Addison disease (HCC)    Anemia    Asthma    - normal spirometry 2009 FEV1  >90% predicted.  - methacoline challenge neg 11/09   Bronchitis    recurrent   Cataract    Chronic cough    - sinus CT 03-23-10 > neg.  - allergy profile 03-23-10 > nl, IgE 14.  - flutter valve rx 03-23-10   Fibromyalgia    GERD (gastroesophageal reflux disease)    exacerbating VCD   Glaucoma    History of blood transfusion    Hyperlipidemia    Hypertension    Hyperthyroidism    with hot nodule.  - total thyroidectomy 11-05-08 benign -> subsequent hypothyroidism.   Hypokalemia    Hypothyroidism    Hypothyroidism    a. following thyroidectomy.   Mild CAD    a. LHC 04/2015 - mild nonobstructive CAD with 30% dLAD, 40% D1, 25% pLCx, 30% mRCA, 30% RPDA, normal EF >65% with normal LVEDP.   Orthostatic hypotension    Paroxysmal atrial fibrillation (HCC)    Renal insufficiency     Vasomotor rhinitis    exacerbates VCD   Vocal cord dysfunction    proven on FOB 9/09    Past Surgical History:  Procedure Laterality Date   ABDOMINAL HYSTERECTOMY  1980   ABDOMINAL HYSTERECTOMY     BASAL CELL CARCINOMA EXCISION  11/08   BIOPSY  12/18/2022   Procedure: BIOPSY;  Surgeon: Iva Boop, MD;  Location: [facility];  Service: Gastroenterology;;   CARDIAC CATHETERIZATION N/A 05/15/2015   Procedure: Left Heart Cath and Coronary Angiography;  Surgeon: Tonny Bollman, MD;  Location: [facility] INVASIVE CV LAB;  Service: Cardiovascular;  Laterality: N/A;   COLONOSCOPY N/A 04/27/2017   Dr Lavon Paganini for scant rectal bleeding and iron def anemia: Melanosis coli, lipomatous IC valve, left sided tics, non-bleeding hemorrhoid, 10 mm transverse polyp.     COLONOSCOPY WITH PROPOFOL N/A 12/18/2022   Procedure: COLONOSCOPY WITH PROPOFOL;  Surgeon: Iva Boop, MD;  Location: [facility] ENDOSCOPY;  Service: Gastroenterology;  Laterality: N/A;   ESOPHAGOGASTRODUODENOSCOPY N/A 04/27/2017   Napoleon Form, MD; [facility] ENDOSCOPY. for iron def anemia.  Monilial/candidial esophagitis. Small HH.     ESOPHAGOGASTRODUODENOSCOPY (EGD) WITH PROPOFOL N/A 12/18/2022   Procedure: ESOPHAGOGASTRODUODENOSCOPY (EGD) WITH PROPOFOL;  Surgeon: Iva Boop, MD;  Location: [facility] ENDOSCOPY;  Service: Gastroenterology;  Laterality: N/A;   EYE SURGERY  POLYPECTOMY  12/18/2022   Procedure: POLYPECTOMY;  Surgeon: Iva Boop, MD;  Location: [facility] ENDOSCOPY;  Service: Gastroenterology;;   scar tissue removal  1982   THYROID SURGERY  2010   THYROIDECTOMY  11-05-08    Family History  Problem Relation Age of Onset   Asthma Mother    Heart disease Mother    Emphysema Father    Heart disease Father    Irregular heart beat Brother    Asthma Grandchild     Social History   Occupational History   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  Gen:      Thin, frail no distress ENT:  mallampati IV, crowded and small oral airway Lungs:    No increased respiratory effort, symmetric chest wall excursion, clear to auscultation bilaterally, no wheezes or crackles CV:        RRR   Data Reviewed: Imaging: I have personally reviewed the chest xray April 2024- no acute cardiopulmonary process.   PFTs:      No data to display         I have personally reviewed the patient's PFTs and unable to complete feno or spirometry maneuvers in office.   Labs: Lab Results  Component Value Date   WBC 7.7 05/01/2023   HGB 12.2 05/01/2023   HCT 38.9 05/01/2023   MCV 86.0 05/01/2023   PLT 433.0 (H) 05/01/2023    Immunization status: Immunization History  Administered Date(s) Administered   Fluad Quad(high Dose 65+) 10/02/2019   Influenza Split 09/11/2012   Influenza Whole 06/22/2009   Influenza, High Dose Seasonal PF 07/06/2017, 08/01/2018   Influenza-Unspecified 07/07/2021   PFIZER(Purple Top)SARS-COV-2 Vaccination 10/18/2019, 11/13/2019, 05/28/2020   Pneumococcal Conjugate-13 11/18/2015   Pneumococcal Polysaccharide-23 08/23/2003, 07/06/2017   Tdap 07/10/2012    External Records Personally Reviewed: pcp  Assessment:  Moderate persistent asthma, improved control Chronic rhinitis, controlled Gerd, controlled.    Plan/Recommendations:  I am glad your breathing is feeling better.  Continue the advair 2 puffs twice daily, gargle after use  Continue the albuterol inhaler. Take the  albuterol rescue inhaler every 4 to 6 hours as needed for wheezing or shortness of breath. You can also take it 15 minutes before exercise or exertional activity. Keep up the exercise program at home.   Continue xyzal for the drainage.  Continue the acid reflux medication.    Return to Care: Return in about 6 months (around 01/02/2024).   Durel Salts, MD Pulmonary and Critical Care Medicine [facility] [phone]

## 2023-07-05 NOTE — Patient Instructions (Addendum)
It was a pleasure to see you today!  Please schedule follow up scheduled with myself in 6 months.  If my schedule is not open yet, we will contact you with a reminder closer to that time. Please call [phone] if you haven't heard from [place] a month before, and always call us sooner if issues or concerns arise. You can also send [place] a message through MyChart, but but aware that this is not to be used for urgent issues and it may take up to 5-7 days to receive a reply. Please be aware that you will likely be able to view your results before I have a chance to respond to them. Please give [place] 5 business days to respond to any non-urgent results.   I am glad your breathing is feeling better.  Continue the advair 2 puffs twice daily, gargle after use  Continue the albuterol inhaler. Take the albuterol rescue inhaler every 4 to 6 hours as needed for wheezing or shortness of breath. You can also take it 15 minutes before exercise or exertional activity. Keep up the exercise program at home.   Continue xyzal for the drainage.  Continue the acid reflux medication.

## 2023-07-06 ENCOUNTER — Encounter: Payer: Self-pay | Admitting: Internal Medicine

## 2023-07-07 ENCOUNTER — Ambulatory Visit (HOSPITAL_COMMUNITY): Payer: Medicare Other | Attending: Physician Assistant

## 2023-07-07 DIAGNOSIS — I340 Nonrheumatic mitral (valve) insufficiency: Secondary | ICD-10-CM | POA: Insufficient documentation

## 2023-07-07 LAB — ECHOCARDIOGRAM COMPLETE
Area-P 1/2: 3.85 cm2
S' Lateral: 1.7 cm

## 2023-07-12 ENCOUNTER — Encounter: Payer: Self-pay | Admitting: Family Medicine

## 2023-07-12 ENCOUNTER — Ambulatory Visit: Payer: Medicare Other | Admitting: Family Medicine

## 2023-07-12 VITALS — BP 104/64 | HR 90 | Temp 97.8°F | Ht 63.0 in | Wt 100.0 lb

## 2023-07-12 DIAGNOSIS — J329 Chronic sinusitis, unspecified: Secondary | ICD-10-CM | POA: Diagnosis not present

## 2023-07-12 DIAGNOSIS — B9689 Other specified bacterial agents as the cause of diseases classified elsewhere: Secondary | ICD-10-CM

## 2023-07-12 DIAGNOSIS — R0981 Nasal congestion: Secondary | ICD-10-CM | POA: Diagnosis not present

## 2023-07-12 LAB — POC COVID19 BINAXNOW: SARS Coronavirus 2 Ag: NEGATIVE

## 2023-07-12 MED ORDER — CETIRIZINE HCL 10 MG PO TABS: 10.0000 mg | ORAL_TABLET | Freq: Every day | ORAL | 11 refills | Status: DC

## 2023-07-12 MED ORDER — DONEPEZIL HCL 5 MG PO TABS: 5.0000 mg | ORAL_TABLET | Freq: Every day | ORAL | 3 refills | Status: DC

## 2023-07-12 MED ORDER — AMOXICILLIN 875 MG PO TABS: 875.0000 mg | ORAL_TABLET | Freq: Two times a day (BID) | ORAL | 0 refills | Status: AC

## 2023-07-12 NOTE — Progress Notes (Signed)
   Subjective:    Patient ID: Kaitlyn Adams, female    DOB: 1951-04-08, 72 y.o.   MRN: [id number]  HPI URI- sxs started Sunday, worsened Monday but 'got really bad' yesterday.  + HA, sinus TTP, + congestion and drainage.  + mild cough.  No fevers or chills.   Review of Systems For ROS see HPI     Objective:   Physical Exam Vitals reviewed.  Constitutional:      General: She is not in acute distress.    Appearance: She is well-developed.  HENT:     Head: Normocephalic and atraumatic.     Right Ear: Tympanic membrane normal.     Left Ear: Tympanic membrane normal.     Nose: Mucosal edema and rhinorrhea present.     Right Sinus: Maxillary sinus tenderness and frontal sinus tenderness present.     Left Sinus: Maxillary sinus tenderness and frontal sinus tenderness present.     Mouth/Throat:     Pharynx: Uvula midline. Posterior oropharyngeal erythema present. No oropharyngeal exudate.  Eyes:     Conjunctiva/sclera: Conjunctivae normal.     Pupils: Pupils are equal, round, and reactive to light.  Cardiovascular:     Rate and Rhythm: Normal rate and regular rhythm.     Heart sounds: Normal heart sounds.  Pulmonary:     Effort: Pulmonary effort is normal. No respiratory distress.     Breath sounds: Normal breath sounds. No wheezing.  Musculoskeletal:     Cervical back: Normal range of motion and neck supple.  Lymphadenopathy:     Cervical: No cervical adenopathy.  Skin:    General: Skin is warm and dry.  Neurological:     General: No focal deficit present.     Mental Status: She is alert and oriented to person, place, and time.     Cranial Nerves: No cranial nerve deficit.     Motor: No weakness.     Coordination: Coordination normal.  Psychiatric:        Mood and Affect: Mood normal.        Behavior: Behavior normal.        Thought Content: Thought content normal.           Assessment & Plan:  Bacterial sinusitis- pt has hx of similar.  Sxs started Sunday and  have progressively worsened.  Start high dose Amoxicillin.  Reviewed supportive care and red flags that should prompt return.  Pt expressed understanding and is in agreement w/ plan.

## 2023-07-12 NOTE — Patient Instructions (Signed)
Follow up as needed or as scheduled START the Amoxicillin twice daily to treat the sinuses Drink LOTS of fluids REST when you can Call with any questions or concerns Hang in there!!! Happy Holidays!!!

## 2023-07-30 ENCOUNTER — Other Ambulatory Visit: Payer: Self-pay | Admitting: Family Medicine

## 2023-07-30 DIAGNOSIS — K5909 Other constipation: Secondary | ICD-10-CM

## 2023-08-01 ENCOUNTER — Other Ambulatory Visit: Payer: Self-pay | Admitting: Family Medicine

## 2023-08-09 ENCOUNTER — Ambulatory Visit (INDEPENDENT_AMBULATORY_CARE_PROVIDER_SITE_OTHER): Payer: Medicare Other | Admitting: Family Medicine

## 2023-08-09 ENCOUNTER — Encounter: Payer: Self-pay | Admitting: Family Medicine

## 2023-08-09 VITALS — BP 102/64 | HR 88 | Temp 98.1°F | Ht 63.0 in | Wt <= 1120 oz

## 2023-08-09 DIAGNOSIS — J329 Chronic sinusitis, unspecified: Secondary | ICD-10-CM

## 2023-08-09 DIAGNOSIS — B9689 Other specified bacterial agents as the cause of diseases classified elsewhere: Secondary | ICD-10-CM | POA: Diagnosis not present

## 2023-08-09 MED ORDER — DOXYCYCLINE HYCLATE 100 MG PO TABS: 100.0000 mg | ORAL_TABLET | Freq: Two times a day (BID) | ORAL | 0 refills | Status: DC

## 2023-08-09 NOTE — Progress Notes (Signed)
   Subjective:    Patient ID: Kaitlyn Adams, female    DOB: 09-25-1950, 72 y.o.   MRN: [id number]  HPI URI- sxs started Sunday and has worsened each day.  + sinus pain and pressure.  + nasal congestion.  + HA.  R ear pain.  No fever.  Mild cough.  + chest wall inflammation.  Was treated last month for similar and sxs improved w/ Amoxicillin.   Review of Systems For ROS see HPI     Objective:   Physical Exam Vitals reviewed.  Constitutional:      General: She is not in acute distress.    Appearance: She is well-developed.  HENT:     Head: Normocephalic and atraumatic.     Right Ear: Tympanic membrane normal.     Left Ear: Tympanic membrane normal.     Nose: Mucosal edema and congestion present. No rhinorrhea.     Right Sinus: Maxillary sinus tenderness and frontal sinus tenderness present.     Left Sinus: Maxillary sinus tenderness and frontal sinus tenderness present.     Mouth/Throat:     Pharynx: Uvula midline. Posterior oropharyngeal erythema present. No oropharyngeal exudate.  Eyes:     Extraocular Movements: Extraocular movements intact.     Conjunctiva/sclera: Conjunctivae normal.     Pupils: Pupils are equal, round, and reactive to light.  Cardiovascular:     Rate and Rhythm: Normal rate and regular rhythm.     Heart sounds: Normal heart sounds.  Pulmonary:     Effort: Pulmonary effort is normal. No respiratory distress.     Breath sounds: Normal breath sounds. No wheezing.  Musculoskeletal:     Cervical back: Normal range of motion and neck supple.  Lymphadenopathy:     Cervical: No cervical adenopathy.  Skin:    General: Skin is warm and dry.  Neurological:     General: No focal deficit present.     Mental Status: She is alert and oriented to person, place, and time.     Cranial Nerves: No cranial nerve deficit.     Motor: No weakness.     Coordination: Coordination normal.  Psychiatric:        Mood and Affect: Mood normal.        Behavior: Behavior  normal.        Thought Content: Thought content normal.           Assessment & Plan:  Bacterial sinusitis- new.  Sxs and PE are consistent w/ previous sinus infxns.  Pt is anxious as she has 14 people coming next week.  Discussed that most sinusitis is viral but given her multiple health issues she is at higher risk of bacterial infxn.  Start Doxycycline BID.  Reviewed supportive care and red flags that should prompt return.  Pt expressed understanding and is in agreement w/ plan.

## 2023-08-09 NOTE — Patient Instructions (Signed)
Follow up as needed or as scheduled START the Doxycycline twice daily- take w/ food Drink LOTS of fluids Tylenol as needed for headache or fever Rest when you can! Call with any questions or concerns Hang in there! Happy Holidays!!!

## 2023-08-18 NOTE — Telephone Encounter (Signed)
error 

## 2023-09-19 ENCOUNTER — Other Ambulatory Visit: Payer: Self-pay | Admitting: Family Medicine

## 2023-09-19 NOTE — Telephone Encounter (Signed)
Requested Prescriptions   Pending Prescriptions Disp Refills   naproxen (NAPROSYN) 500 MG tablet [Pharmacy Med Name: NAPROXEN 500MG  TABLETS] 30 tablet 0    Sig: TAKE 1 TABLET(500 MG) BY MOUTH TWICE DAILY WITH A MEAL     Date of patient request: 09/19/2023 Last office visit: 08/09/2023 Upcoming visit: Visit date not found Date of last refill: 08/02/2023 Last refill amount: 30

## 2023-09-20 ENCOUNTER — Ambulatory Visit: Payer: Self-pay | Admitting: Family Medicine

## 2023-09-20 NOTE — Telephone Encounter (Signed)
  Chief Complaint: L shoulder pain Symptoms: L shoulder swelling/weakness, bilateral hand swelling, painful to touch Frequency: since this weekend Pertinent Negatives: Patient denies chest pain, SOB, numbness/tingling, injuries, fever, redness at site Disposition: [] ED /[] Urgent Care (no appt availability in office) / [x] Appointment(In office/virtual)/ []  [facility]/ [] Home Care/ [] Refused Recommended Disposition /[] [facility]/ []  Follow-up with PCP Additional Notes: Patient c/o L shoulder pain since this weekend. Reports some swelling at site and swelling in bilateral hands. Denies SOB, chest pain, numbness/tingling, fever, injuries. Endorses pain with touch and movement of arm above head. Has tried OTC medications without relief. Scheduled patient per protocol on 09/21/2023. Patient verbalized understanding and to call back with worsening symptoms.    Copied from CRM [id number]. Topic: Clinical - Red Word Triage >> Sep 20, 2023  2:04 PM Larwance Sachs wrote: Red Word that prompted transfer to Nurse Triage: Patient called in regarding getting scheduled, stating inflammation started around left shoulder on Sunday and has since moved all the way down arm to elbow and even hand, has been painful and is continuing to worsen. Reason for Disposition  [1] MODERATE pain (e.g., interferes with normal activities) AND [2] present > 3 days  Answer Assessment - Initial Assessment Questions 1. ONSET: "When did the pain start?"     Over the weekend 2. LOCATION: "Where is the pain located?"     Left shoulder 3. PAIN: "How bad is the pain?" (Scale 1-10; or mild, moderate, severe)   - MILD (1-3): Doesn't interfere with normal activities.   - MODERATE (4-7): Interferes with normal activities (e.g., work or school) or awakens from sleep.   - SEVERE (8-10): Excruciating pain, unable to do any normal activities, unable to hold a cup of water.     Moderate - unable to raise arm over head, dry  hair, etc, painful to touch Has been taken Tylenol and naproxen without relief 4. WORK OR EXERCISE: "Has there been any recent work or exercise that involved this part of the body?"     No Denies injuries 5. CAUSE: "What do you think is causing the arm pain?"     Reports fibromyalgia, arthritis and addisons - but reports worse than those things 6. OTHER SYMPTOMS: "Do you have any other symptoms?" (e.g., neck pain, swelling, rash, fever, numbness, weakness)     Swelling in hands since Tuesday, L > R  Protocols used: Arm Pain-A-AH

## 2023-09-20 NOTE — Telephone Encounter (Signed)
Appt tomorrow Lorain Childes

## 2023-09-21 ENCOUNTER — Ambulatory Visit: Payer: Medicare Other | Admitting: Family Medicine

## 2023-09-21 ENCOUNTER — Encounter: Payer: Self-pay | Admitting: Family Medicine

## 2023-09-21 VITALS — BP 112/62 | HR 82 | Temp 97.9°F | Ht 63.0 in | Wt 102.2 lb

## 2023-09-21 DIAGNOSIS — M79602 Pain in left arm: Secondary | ICD-10-CM | POA: Diagnosis not present

## 2023-09-21 MED ORDER — PREDNISONE 10 MG PO TABS: ORAL_TABLET | ORAL | 0 refills | Status: DC

## 2023-09-21 NOTE — Patient Instructions (Signed)
Follow up as needed or as scheduled START the Prednisone as directed- 3 pills at the same time x3 days, then 2 pills at the same time x3 days, then 1 pill daily.  Take w/ food  You can add Tylenol (acetaminophen) as needed for pain relief Do NOT add any addition ibuprofen, Aleve, Naproxen, etc Elevate your arm when sitting to help w/ the swelling You can use ice or heat on your shoulder- whichever feels better Call with any questions or concerns- particularly if not improving Hang in there!!

## 2023-09-21 NOTE — Progress Notes (Signed)
   Subjective:    Patient ID: Kaitlyn Adams, female    DOB: 01-27-1951, 73 y.o.   MRN: [id number]  HPI Pain- pt reports she has swelling of L arm and pain extends from hand to shoulder.  Also having pain in her back.  Sxs started ~5 days ago.  No relief w/ Tylenol or ibuprofen.  Naproxen was not effective.  Denies any changes in activity.  Does not sleep on L side.  She reports sxs started suddenly and 'are getting worse'.  Feels this is worse than her typical fibro flares.   Review of Systems For ROS see HPI     Objective:   Physical Exam Vitals reviewed.  Constitutional:      General: She is not in acute distress.    Appearance: Normal appearance. She is not ill-appearing.  HENT:     Head: Normocephalic and atraumatic.  Cardiovascular:     Pulses: Normal pulses.  Musculoskeletal:        General: Swelling (swelling of L hand and wrist (rings are very tight)) and tenderness (TTP of L wrist, elbow, and shoulder) present. No deformity.     Comments: Full ROM of L wrist but has pain Pain restricts exam of L shoulder  Neurological:     Mental Status: She is alert.           Assessment & Plan:  L arm pain- new.  No known cause.  No relief w/ Tylenol or OTC NSAIDs.  + swelling of L hand and wrist w/ very tight rings.  Start Prednisone taper for relief of pain and swelling.  Encouraged her to elevate her hand when sitting.  If no improvement, will refer to Ortho for complete evaluation and tx.  Pt expressed understanding and is in agreement w/ plan.

## 2023-10-02 ENCOUNTER — Other Ambulatory Visit: Payer: Self-pay | Admitting: Family Medicine

## 2023-10-12 ENCOUNTER — Inpatient Hospital Stay: Payer: Medicare Other

## 2023-10-12 ENCOUNTER — Other Ambulatory Visit: Payer: Self-pay

## 2023-10-12 ENCOUNTER — Inpatient Hospital Stay: Payer: Medicare Other | Admitting: Nurse Practitioner

## 2023-10-12 ENCOUNTER — Other Ambulatory Visit: Payer: Medicare Other

## 2023-10-12 ENCOUNTER — Ambulatory Visit: Payer: Medicare Other | Admitting: Oncology

## 2023-10-19 ENCOUNTER — Inpatient Hospital Stay: Payer: Medicare Other | Attending: Nurse Practitioner

## 2023-10-19 ENCOUNTER — Ambulatory Visit: Payer: Self-pay | Admitting: Family Medicine

## 2023-10-19 ENCOUNTER — Encounter: Payer: Self-pay | Admitting: Nurse Practitioner

## 2023-10-19 ENCOUNTER — Inpatient Hospital Stay (HOSPITAL_BASED_OUTPATIENT_CLINIC_OR_DEPARTMENT_OTHER): Payer: Medicare Other | Admitting: Nurse Practitioner

## 2023-10-19 VITALS — BP 113/74 | HR 77 | Temp 98.2°F | Resp 18 | Ht 63.0 in | Wt 97.3 lb

## 2023-10-19 DIAGNOSIS — R0789 Other chest pain: Secondary | ICD-10-CM | POA: Diagnosis not present

## 2023-10-19 DIAGNOSIS — Z87891 Personal history of nicotine dependence: Secondary | ICD-10-CM | POA: Diagnosis not present

## 2023-10-19 DIAGNOSIS — N189 Chronic kidney disease, unspecified: Secondary | ICD-10-CM | POA: Diagnosis not present

## 2023-10-19 DIAGNOSIS — Z9181 History of falling: Secondary | ICD-10-CM | POA: Diagnosis not present

## 2023-10-19 DIAGNOSIS — I2510 Atherosclerotic heart disease of native coronary artery without angina pectoris: Secondary | ICD-10-CM | POA: Diagnosis not present

## 2023-10-19 DIAGNOSIS — D509 Iron deficiency anemia, unspecified: Secondary | ICD-10-CM | POA: Diagnosis not present

## 2023-10-19 LAB — CBC WITH DIFFERENTIAL (CANCER CENTER ONLY)
Abs Immature Granulocytes: 0.08 10*3/uL — ABNORMAL HIGH (ref 0.00–0.07)
Basophils Absolute: 0.1 10*3/uL (ref 0.0–0.1)
Basophils Relative: 0 %
Eosinophils Absolute: 0 10*3/uL (ref 0.0–0.5)
Eosinophils Relative: 0 %
HCT: 40.4 % (ref 36.0–46.0)
Hemoglobin: 13.1 g/dL (ref 12.0–15.0)
Immature Granulocytes: 1 %
Lymphocytes Relative: 6 %
Lymphs Abs: 1 10*3/uL (ref 0.7–4.0)
MCH: 27.3 pg (ref 26.0–34.0)
MCHC: 32.4 g/dL (ref 30.0–36.0)
MCV: 84.2 fL (ref 80.0–100.0)
Monocytes Absolute: 0.9 10*3/uL (ref 0.1–1.0)
Monocytes Relative: 5 %
Neutro Abs: 14.1 10*3/uL — ABNORMAL HIGH (ref 1.7–7.7)
Neutrophils Relative %: 88 %
Platelet Count: 422 10*3/uL — ABNORMAL HIGH (ref 150–400)
RBC: 4.8 MIL/uL (ref 3.87–5.11)
RDW: 16.6 % — ABNORMAL HIGH (ref 11.5–15.5)
WBC Count: 16.1 10*3/uL — ABNORMAL HIGH (ref 4.0–10.5)
nRBC: 0 % (ref 0.0–0.2)

## 2023-10-19 LAB — FERRITIN: Ferritin: 36 ng/mL (ref 11–307)

## 2023-10-19 NOTE — Telephone Encounter (Signed)
 FYI about patient care, they will be seen today at [facility]

## 2023-10-19 NOTE — Progress Notes (Signed)
  [facility] OFFICE PROGRESS NOTE   Diagnosis: Iron deficiency anemia  INTERVAL HISTORY:   Kaitlyn Adams returns as scheduled.  She continues oral iron 2-3 times a day.  No constipation or nausea.  She denies bleeding.  No black stools.  Occasional cough.  Stable mild dyspnea on exertion.  No fever.  She reports having a fall late last week.  She fell onto her right side.  For the past 3 to 4 days she has noted pain at the right lateral chest with certain movements and deep breathing.  No rash.  Objective:  Vital signs in last 24 hours:  Blood pressure 113/74, pulse 77, temperature 98.2 F (36.8 C), temperature source Temporal, resp. rate 18, height 5\' 3"  (1.6 m), weight 97 lb 4.8 oz (44.1 kg), SpO2 100%.    Resp: Lungs clear bilaterally. Cardio: Regular rate and rhythm. GI: Abdomen soft and nontender.  No hepatosplenomegaly. Vascular: No leg edema. Musculoskeletal: Tender over the right anterolateral upper chest/ribs.   Lab Results:  Lab Results  Component Value Date   WBC 16.1 (H) 10/19/2023   HGB 13.1 10/19/2023   HCT 40.4 10/19/2023   MCV 84.2 10/19/2023   PLT 422 (H) 10/19/2023   NEUTROABS 14.1 (H) 10/19/2023    Imaging:  No results found.  Medications: I have reviewed the patient's current medications.  Assessment/Plan: Iron deficiency anemia 12/16/2022 hemoglobin 5.6, MCV 68, ferritin 3 12/17/2022 2 units red blood cells 12/17/2022 ferrous gluconate 125 mg IV 12/18/2022 upper endoscopy-esophageal plaques were found secondary to candidiasis, examination otherwise normal 12/18/2022 colonoscopy-post polypectomy scar at the ileocecal valve, melanosis in the colon, anal stricture and nonthrombosed external hemorrhoids found on digital rectal exam, 2 polyps in the distal transverse colon and distal ascending colon.  Tubular adenoma 02/03/2023 stool positive for occult blood x 3 02/03/2023 urine negative for blood 06/20/2023 capsule endoscopy-incomplete  capsule endoscopy with inadequate prep and very poor visualization of small bowel. Hospitalization with severe microcytic anemia 12/16/2022 Hospitalization September 2018 with severe microcytic anemia 04/25/2017 hemoglobin 5.0, MCV 53, ferritin 2 04/26/2017 stool positive for occult blood Upper endoscopy 04/27/2017-mild Candida esophagitis Colonoscopy 04/27/2017-melanosis, lipomatous ileocecal valve-tubular adenoma, 10 mm polyp at the transverse colon-tubular adenoma Capsule endoscopy 05/10/2018-single linear erosion at 1 hour 32 minutes, otherwise negative Colonoscopy 06/01/2018-polyps removed from the ileocecal valve and ascending colon Esophageal candidiasis 12/18/2022, completed treatment with fluconazole Addison's disease CKD CAD  Disposition: Kaitlyn Adams has a history of iron deficiency anemia.  The hemoglobin remains corrected into normal range with oral iron replacement.  We will follow-up on the ferritin from today.  She had a capsule endoscopy October 2024 which was incomplete with poor visualization of the small bowel.  She plans to follow-up with Dr. Leone Payor.  She had a recent fall onto her right side.  She has since noted pain at the right upper anterolateral chest wall.  She is tender on examination.  Likely musculoskeletal injury related to the fall.  She has follow-up with her PCP tomorrow.  She will return for an office visit and lab in 6 months.  We are available to see her sooner if needed.  Lonna Cobb ANP/GNP-BC   10/19/2023  2:30 PM

## 2023-10-19 NOTE — Telephone Encounter (Signed)
  Chief Complaint: right side chest pain constant and reports feels like "inflammation" that she has had in the past Symptoms: constant right side chest pain, breathing in causes pain in right side of chest. Reports hx of chest wall inflammation Frequency: 2 days ago  Pertinent Negatives: Patient denies chest pain left side . No difficulty breathing no fever no sweating no dizziness  Disposition: [] ED /[] Urgent Care (no appt availability in office) / [x] Appointment(In office/virtual)/ []  [facility]/ [] Home Care/ [] Refused Recommended Disposition /[] [facility]/ []  Follow-up with PCP Additional Notes:   No available appt with PCP . Scheduled appt with another provider tomorrow per patient request.  Recommended if sx worsen or chest pain worse with taking deep breath in go to ED.      Copied from CRM [id number]. Topic: Clinical - Red Word Triage >> Oct 19, 2023 10:06 AM Denese Killings wrote: Red Word that prompted transfer to Nurse Triage: Patient has inflammation on the right side of chest and it hurts. Reason for Disposition  [1] Chest pain lasts > 5 minutes AND [2] occurred > 3 days ago (72 hours) AND [3] NO chest pain or cardiac symptoms now    Started 2 days ago and feels like "inflammation" that has occurred in the past. With constant chest discomfort  Answer Assessment - Initial Assessment Questions 1. LOCATION: "Where does it hurt?"       Right side of chest pain when breathing in  2. RADIATION: "Does the pain go anywhere else?" (e.g., into neck, jaw, arms, back)     No  3. ONSET: "When did the chest pain begin?" (Minutes, hours or days)      2 days  4. PATTERN: "Does the pain come and go, or has it been constant since it started?"  "Does it get worse with exertion?"      Constant  5. DURATION: "How long does it last" (e.g., seconds, minutes, hours)     na 6. SEVERITY: "How bad is the pain?"  (e.g., Scale 1-10; mild, moderate, or severe)    - MILD (1-3): doesn't  interfere with normal activities     - MODERATE (4-7): interferes with normal activities or awakens from sleep    - SEVERE (8-10): excruciating pain, unable to do any normal activities       Pain level 6  Able to sleep .  7. CARDIAC RISK FACTORS: "Do you have any history of heart problems or risk factors for heart disease?" (e.g., angina, prior heart attack; diabetes, high blood pressure, high cholesterol, smoker, or strong family history of heart disease)     Hx HTN , high cholesterol  8. PULMONARY RISK FACTORS: "Do you have any history of lung disease?"  (e.g., blood clots in lung, asthma, emphysema, birth control pills)     Hx asthma  9. CAUSE: "What do you think is causing the chest pain?"     Inflammation  10. OTHER SYMPTOMS: "Do you have any other symptoms?" (e.g., dizziness, nausea, vomiting, sweating, fever, difficulty breathing, cough)       Pain right chest area taking deep breath in causes soreness in chest area 11. PREGNANCY: "Is there any chance you are pregnant?" "When was your last menstrual period?"       na  Protocols used: Chest Pain-A-AH

## 2023-10-20 ENCOUNTER — Encounter (HOSPITAL_BASED_OUTPATIENT_CLINIC_OR_DEPARTMENT_OTHER): Payer: Self-pay | Admitting: Family Medicine

## 2023-10-20 ENCOUNTER — Ambulatory Visit (HOSPITAL_BASED_OUTPATIENT_CLINIC_OR_DEPARTMENT_OTHER): Payer: Medicare Other | Admitting: Family Medicine

## 2023-10-20 VITALS — BP 128/71 | HR 73 | Ht 63.0 in | Wt 96.0 lb

## 2023-10-20 DIAGNOSIS — M940 Chondrocostal junction syndrome [Tietze]: Secondary | ICD-10-CM

## 2023-10-20 MED ORDER — NAPROXEN 250 MG PO TABS
250.0000 mg | ORAL_TABLET | Freq: Two times a day (BID) | ORAL | 0 refills | Status: AC
Start: 2023-10-20 — End: 2023-10-25

## 2023-10-20 NOTE — Progress Notes (Signed)
 Acute Care Office Visit  Subjective:   Kaitlyn Adams 1951/08/13 10/20/2023  Chief Complaint  Patient presents with   Chest Pain    Patient states she has been having discomfort in her chest which she has had before. States they call it chest wall inflammation which has been going on now for about 3 days.    HPI: CHEST PAIN Kaitlyn Adams is a 73 yo female with hx of atrial fibrillation, adrenal insufficiency, renal insufficiency, hypothyroidism, fibromyalgia, and iron def anemia presents complaining of chest pain. Patient reports hx of costochondritis relieved by NSAIDS in the past. She reports a recent mechanical  fall on her right side but denies injuring or hitting her chest wall.  Onset: Several days ago   Location: Right side radiating throughout the chest. Denies radiation to back, jaw, or upper extremities.   Duration: Constant, aching pain.    Better with: Heating Pad   Worse with: Movement, bending forward Treatments tried: Heating pad  Symptoms Fever/chills: no  Nausea/vomiting: no  Diaphoresis: no  Shortness of breath: Yes, reports this is chronic and intermittent depending on activity   Cough: no  Edema: no  Orthopnea: no  Dizziness: no  Palpitations: no  Syncope: no  Indigestion: no    Red Flags Worse with exertion: no  Recent Immobility: no  Cancer history: no  Tearing/radiation to back: no   The ASCVD Risk score (Arnett DK, et al., 2019) failed to calculate for the following reasons:   The valid total cholesterol range is 130 to 320 mg/dL    The following portions of the patient's history were reviewed and updated as appropriate: past medical history, past surgical history, family history, social history, allergies, medications, and problem list.   Patient Active Problem List   Diagnosis Date Noted   Frequent falls 02/07/2023   Candida esophagitis (HCC) 12/18/2022   Benign neoplasm of ascending colon 12/18/2022   Benign neoplasm  of transverse colon 12/18/2022   Anal stricture 12/18/2022   CKD (chronic kidney disease) stage 3, GFR 30-59 ml/min (HCC) 12/17/2022   Adrenal insufficiency (HCC) 11/06/2020   Disorder of bone 11/06/2020   Disorder of calcium metabolism 11/06/2020   Vitamin D deficiency 11/06/2020   Hypokalemia 10/25/2018   Moderate protein-calorie malnutrition (HCC) 08/01/2018   Upper airway cough syndrome 05/22/2017   Symptomatic anemia 04/26/2017   Chronic constipation 07/07/2016   History of colonic polyps 07/07/2016   Chronic anticoagulation 07/07/2016   Iron deficiency anemia 06/15/2016   Anxiety and depression 05/18/2016   Physical exam 11/18/2015   Pain in joint, ankle and foot 11/16/2015   PAT (paroxysmal atrial tachycardia) (HCC) 09/08/2015   Hyperlipidemia 07/30/2015   CAD (coronary artery disease)    Chest pain at rest, not due to CAD, possible due to a fib. 05/15/2015   Chronic bronchitis (HCC) 06/17/2011   Orthostatic hypotension 12/06/2010   Paroxysmal atrial fibrillation (HCC)    Renal insufficiency    Myalgia    Hypothyroidism    Glaucoma    GERD, SEVERE 08/19/2010   Osteoarthritis 04/17/2008   FIBROMYALGIA 04/17/2008   Past Medical History:  Diagnosis Date   Addison disease (HCC)    Anemia    Asthma    - normal spirometry 2009 FEV1  >90% predicted.  - methacoline challenge neg 11/09   Bronchitis    recurrent   Cataract    Chronic cough    - sinus CT 03-23-10 > neg.  - allergy profile 03-23-10 >  nl, IgE 14.  - flutter valve rx 03-23-10   Fibromyalgia    GERD (gastroesophageal reflux disease)    exacerbating VCD   Glaucoma    History of blood transfusion    Hyperlipidemia    Hypertension    Hyperthyroidism    with hot nodule.  - total thyroidectomy 11-05-08 benign -> subsequent hypothyroidism.   Hypokalemia    Hypothyroidism    Hypothyroidism    a. following thyroidectomy.   Mild CAD    a. LHC 04/2015 - mild nonobstructive CAD with 30% dLAD, 40% D1, 25% pLCx, 30%  mRCA, 30% RPDA, normal EF >65% with normal LVEDP.   Orthostatic hypotension    Paroxysmal atrial fibrillation (HCC)    Renal insufficiency    Vasomotor rhinitis    exacerbates VCD   Vocal cord dysfunction    proven on FOB 9/09   Past Surgical History:  Procedure Laterality Date   ABDOMINAL HYSTERECTOMY  1980   ABDOMINAL HYSTERECTOMY     BASAL CELL CARCINOMA EXCISION  11/08   BIOPSY  12/18/2022   Procedure: BIOPSY;  Surgeon: Iva Boop, MD;  Location: [facility];  Service: Gastroenterology;;   CARDIAC CATHETERIZATION N/A 05/15/2015   Procedure: Left Heart Cath and Coronary Angiography;  Surgeon: Tonny Bollman, MD;  Location: [facility] INVASIVE CV LAB;  Service: Cardiovascular;  Laterality: N/A;   COLONOSCOPY N/A 04/27/2017   Dr Lavon Paganini for scant rectal bleeding and iron def anemia: Melanosis coli, lipomatous IC valve, left sided tics, non-bleeding hemorrhoid, 10 mm transverse polyp.     COLONOSCOPY WITH PROPOFOL N/A 12/18/2022   Procedure: COLONOSCOPY WITH PROPOFOL;  Surgeon: Iva Boop, MD;  Location: [facility];  Service: Gastroenterology;  Laterality: N/A;   ESOPHAGOGASTRODUODENOSCOPY N/A 04/27/2017   Napoleon Form, MD; [facility]. for iron def anemia.  Monilial/candidial esophagitis. Small HH.     ESOPHAGOGASTRODUODENOSCOPY (EGD) WITH PROPOFOL N/A 12/18/2022   Procedure: ESOPHAGOGASTRODUODENOSCOPY (EGD) WITH PROPOFOL;  Surgeon: Iva Boop, MD;  Location: [facility];  Service: Gastroenterology;  Laterality: N/A;   EYE SURGERY     POLYPECTOMY  12/18/2022   Procedure: POLYPECTOMY;  Surgeon: Iva Boop, MD;  Location: [facility];  Service: Gastroenterology;;   scar tissue removal  1982   THYROID SURGERY  2010   THYROIDECTOMY  11-05-08   Family History  Problem Relation Age of Onset   Asthma Mother    Heart disease Mother    Emphysema Father    Heart disease Father    Irregular heart beat Brother    Asthma Grandchild    Outpatient Medications Prior to  Visit  Medication Sig Dispense Refill   ADVAIR HFA 115-21 MCG/ACT inhaler Inhale 2 puffs into the lungs 2 (two) times daily.     albuterol (VENTOLIN HFA) 108 (90 Base) MCG/ACT inhaler INHALE 2 PUFFS INTO THE LUNGS EVERY 6 HOURS AS NEEDED FOR WHEEZING OR SHORTNESS OF BREATH 6.7 g 1   ALPRAZolam (XANAX) 0.25 MG tablet Take 1 tablet (0.25 mg total) by mouth 2 (two) times daily as needed for anxiety. 30 tablet 0   amitriptyline (ELAVIL) 50 MG tablet TAKE 1 TABLET BY MOUTH AT BEDTIME 90 tablet 1   atorvastatin (LIPITOR) 20 MG tablet TAKE 1 TABLET(20 MG) BY MOUTH DAILY 90 tablet 1   digoxin (LANOXIN) 0.125 MG tablet Take 1 tablet (0.125 mg total) by mouth daily. 90 tablet 3   diphenhydramine-acetaminophen (TYLENOL PM) 25-500 MG TABS tablet Take 1 tablet by mouth at bedtime as needed (pain).  donepezil (ARICEPT) 5 MG tablet TAKE 1 TABLET(5 MG) BY MOUTH AT BEDTIME 30 tablet 3   ferrous sulfate 325 (65 FE) MG EC tablet Take 1 tablet (325 mg total) by mouth daily with breakfast.     furosemide (LASIX) 20 MG tablet Take 1 tablet (20 mg total) by mouth daily as needed for edema. 90 tablet 3   hydrocortisone (CORTEF) 10 MG tablet Take 10 mg by mouth 2 (two) times daily. Patient reports taking BID 09/07/2022     hydroxypropyl methylcellulose / hypromellose (ISOPTO TEARS / GONIOVISC) 2.5 % ophthalmic solution Place 2 drops into both eyes daily as needed for dry eyes.     levocetirizine (XYZAL) 5 MG tablet Take 1 tablet (5 mg total) by mouth every evening. 30 tablet 11   levothyroxine (SYNTHROID) 25 MCG tablet Take 25 mcg by mouth every morning.     pantoprazole (PROTONIX) 40 MG tablet Take 1 tablet (40 mg total) by mouth 2 (two) times daily before a meal. 60 tablet 3   potassium chloride SA (KLOR-CON M) 20 MEQ tablet Take 2 tablets (40 mEq total) by mouth daily. 270 tablet 3   naproxen (NAPROSYN) 500 MG tablet TAKE 1 TABLET(500 MG) BY MOUTH TWICE DAILY WITH A MEAL 30 tablet 0   cetirizine (ZYRTEC) 10 MG  tablet Take 1 tablet (10 mg total) by mouth daily. (Patient not taking: Reported on 10/19/2023) 30 tablet 11   doxycycline (VIBRA-TABS) 100 MG tablet Take 1 tablet (100 mg total) by mouth 2 (two) times daily. (Patient not taking: Reported on 10/19/2023) 14 tablet 0   predniSONE (DELTASONE) 10 MG tablet 3 tabs x3 days and then 2 tabs x3 days and then 1 tab x3 days.  Take w/ food. (Patient not taking: Reported on 10/19/2023) 18 tablet 0   No facility-administered medications prior to visit.   Allergies  Allergen Reactions   Fenofibrate Other (See Comments)    GI side effects    Erythromycin Nausea And Vomiting    REACTION: nausea and vomiting   Guaifenesin Er Palpitations   Sulfonamide Derivatives Nausea And Vomiting    REACTION: nausea and vomiting     ROS: A complete ROS was performed with pertinent positives/negatives noted in the HPI. The remainder of the ROS are negative.    Objective:   Today's Vitals   10/20/23 0804  BP: 128/71  Pulse: 73  SpO2: 99%  Weight: 96 lb (43.5 kg)  Height: 5\' 3"  (1.6 m)    GENERAL: Well-appearing, in NAD. Well nourished.  SKIN: Pink, warm and dry.   Head: Normocephalic. NECK: Trachea midline. Full ROM w/o pain or tenderness.  THROAT: Uvula midline. Oropharynx clear. . Mucous membranes pink and moist.  RESPIRATORY: Chest wall symmetrical. Respirations even and non-labored. Breath sounds clear to auscultation bilaterally.  CARDIAC: Tenderness present to right anterior chest wall with palpation. No crepitus or deformity. S1, S2 present, regular rate and rhythm without murmur or gallops. Peripheral pulses 2+ bilaterally.  MSK: Muscle tone and strength appropriate for age.  EXTREMITIES: Without clubbing, cyanosis, or edema.  NEUROLOGIC: No motor or sensory deficits. Steady, even gait. C2-C12 intact.  PSYCH/MENTAL STATUS: Alert, oriented x 3. Cooperative, appropriate mood and affect.   EKG:  normal sinus rhythm, RBBB. Abnormal EKG. Unchanged from  previous tracings in April 2024.      Assessment & Plan:  1. Costochondritis (Primary) Pain is likely inflammatory to chest wall. Will treat with short course of Naproxen 500mg  BID with meals and continue use of  heat pad. Recommend rest from activities and avoid heavy lifting. If no improvement in 1 week, reach out to PCP.    Return if symptoms worsen or fail to improve.    Patient to reach out to office if new, worrisome, or unresolved symptoms arise or if no improvement in patient's condition. Patient verbalized understanding and is agreeable to treatment plan. All questions answered to patient's satisfaction.    Hilbert Bible, [place]

## 2023-10-27 ENCOUNTER — Other Ambulatory Visit: Payer: Self-pay | Admitting: Cardiology

## 2023-11-06 ENCOUNTER — Other Ambulatory Visit: Payer: Self-pay | Admitting: Family Medicine

## 2023-11-20 ENCOUNTER — Other Ambulatory Visit (HOSPITAL_COMMUNITY): Payer: Self-pay

## 2023-11-20 ENCOUNTER — Telehealth: Payer: Self-pay

## 2023-11-20 NOTE — Telephone Encounter (Signed)
*  Pulm  Pharmacy Patient Advocate Encounter   Received notification from Fax that prior authorization for Advair HFA is required/requested.   Insurance verification completed.   The patient is insured through [employer] [place] [employer] .   Per test claim:  Advair Diksus, Lavada Mesi, Symbicort is preferred by the insurance.  If suggested medication is appropriate, Please send in a new RX and discontinue this one. If not, please advise as to why it's not appropriate so that we may request a Prior Authorization. Please note, some preferred medications may still require a PA.  If the suggested medications have not been trialed and there are no contraindications to their use, the PA will not be submitted, as it will not be approved.

## 2023-11-22 ENCOUNTER — Other Ambulatory Visit: Payer: Self-pay | Admitting: Cardiology

## 2023-11-23 ENCOUNTER — Other Ambulatory Visit: Payer: Self-pay | Admitting: Cardiology

## 2023-11-23 MED ORDER — FLUTICASONE-SALMETEROL 250-50 MCG/ACT IN AEPB: 1.0000 | INHALATION_SPRAY | Freq: Two times a day (BID) | RESPIRATORY_TRACT | 5 refills | Status: DC

## 2023-11-23 NOTE — Telephone Encounter (Signed)
 Advair diskus sent to pharmacy instead of hfa

## 2023-11-24 ENCOUNTER — Other Ambulatory Visit: Payer: Self-pay | Admitting: Family Medicine

## 2023-11-24 DIAGNOSIS — K5909 Other constipation: Secondary | ICD-10-CM

## 2023-11-28 ENCOUNTER — Other Ambulatory Visit: Payer: Self-pay

## 2023-11-28 MED ORDER — ATORVASTATIN CALCIUM 20 MG PO TABS: ORAL_TABLET | ORAL | 1 refills | Status: DC

## 2023-12-18 ENCOUNTER — Encounter (HOSPITAL_COMMUNITY): Payer: Self-pay

## 2023-12-18 ENCOUNTER — Emergency Department (HOSPITAL_COMMUNITY)

## 2023-12-18 ENCOUNTER — Other Ambulatory Visit: Payer: Self-pay

## 2023-12-18 ENCOUNTER — Inpatient Hospital Stay (HOSPITAL_COMMUNITY)
Admission: EM | Admit: 2023-12-18 | Discharge: 2023-12-22 | DRG: 641 | Disposition: A | Discharge status: Home Health | Attending: Internal Medicine | Admitting: Internal Medicine

## 2023-12-18 DIAGNOSIS — N179 Acute kidney failure, unspecified: Secondary | ICD-10-CM | POA: Diagnosis present

## 2023-12-18 DIAGNOSIS — E538 Deficiency of other specified B group vitamins: Secondary | ICD-10-CM | POA: Diagnosis present

## 2023-12-18 DIAGNOSIS — M940 Chondrocostal junction syndrome [Tietze]: Secondary | ICD-10-CM | POA: Diagnosis present

## 2023-12-18 DIAGNOSIS — J45909 Unspecified asthma, uncomplicated: Secondary | ICD-10-CM | POA: Diagnosis not present

## 2023-12-18 DIAGNOSIS — R109 Unspecified abdominal pain: Secondary | ICD-10-CM | POA: Diagnosis not present

## 2023-12-18 DIAGNOSIS — Z79899 Other long term (current) drug therapy: Secondary | ICD-10-CM

## 2023-12-18 DIAGNOSIS — M542 Cervicalgia: Secondary | ICD-10-CM | POA: Diagnosis not present

## 2023-12-18 DIAGNOSIS — R531 Weakness: Principal | ICD-10-CM

## 2023-12-18 DIAGNOSIS — I700 Atherosclerosis of aorta: Secondary | ICD-10-CM | POA: Diagnosis not present

## 2023-12-18 DIAGNOSIS — I480 Paroxysmal atrial fibrillation: Secondary | ICD-10-CM | POA: Diagnosis present

## 2023-12-18 DIAGNOSIS — R7989 Other specified abnormal findings of blood chemistry: Secondary | ICD-10-CM | POA: Diagnosis not present

## 2023-12-18 DIAGNOSIS — R778 Other specified abnormalities of plasma proteins: Secondary | ICD-10-CM | POA: Diagnosis not present

## 2023-12-18 DIAGNOSIS — B9620 Unspecified Escherichia coli [E. coli] as the cause of diseases classified elsewhere: Secondary | ICD-10-CM | POA: Diagnosis present

## 2023-12-18 DIAGNOSIS — M545 Low back pain, unspecified: Secondary | ICD-10-CM | POA: Diagnosis not present

## 2023-12-18 DIAGNOSIS — R0902 Hypoxemia: Secondary | ICD-10-CM | POA: Diagnosis not present

## 2023-12-18 DIAGNOSIS — I959 Hypotension, unspecified: Secondary | ICD-10-CM | POA: Diagnosis not present

## 2023-12-18 DIAGNOSIS — Z8249 Family history of ischemic heart disease and other diseases of the circulatory system: Secondary | ICD-10-CM

## 2023-12-18 DIAGNOSIS — E876 Hypokalemia: Secondary | ICD-10-CM | POA: Diagnosis not present

## 2023-12-18 DIAGNOSIS — E8721 Acute metabolic acidosis: Secondary | ICD-10-CM | POA: Diagnosis not present

## 2023-12-18 DIAGNOSIS — Z825 Family history of asthma and other chronic lower respiratory diseases: Secondary | ICD-10-CM

## 2023-12-18 DIAGNOSIS — H409 Unspecified glaucoma: Secondary | ICD-10-CM | POA: Diagnosis not present

## 2023-12-18 DIAGNOSIS — I129 Hypertensive chronic kidney disease with stage 1 through stage 4 chronic kidney disease, or unspecified chronic kidney disease: Secondary | ICD-10-CM | POA: Diagnosis not present

## 2023-12-18 DIAGNOSIS — D696 Thrombocytopenia, unspecified: Secondary | ICD-10-CM | POA: Diagnosis present

## 2023-12-18 DIAGNOSIS — I951 Orthostatic hypotension: Secondary | ICD-10-CM | POA: Diagnosis not present

## 2023-12-18 DIAGNOSIS — I2489 Other forms of acute ischemic heart disease: Secondary | ICD-10-CM | POA: Diagnosis not present

## 2023-12-18 DIAGNOSIS — Z0389 Encounter for observation for other suspected diseases and conditions ruled out: Secondary | ICD-10-CM | POA: Diagnosis not present

## 2023-12-18 DIAGNOSIS — D75839 Thrombocytosis, unspecified: Secondary | ICD-10-CM | POA: Diagnosis not present

## 2023-12-18 DIAGNOSIS — R110 Nausea: Secondary | ICD-10-CM | POA: Diagnosis not present

## 2023-12-18 DIAGNOSIS — I4510 Unspecified right bundle-branch block: Secondary | ICD-10-CM

## 2023-12-18 DIAGNOSIS — N1832 Chronic kidney disease, stage 3b: Secondary | ICD-10-CM

## 2023-12-18 DIAGNOSIS — E2740 Unspecified adrenocortical insufficiency: Secondary | ICD-10-CM | POA: Diagnosis not present

## 2023-12-18 DIAGNOSIS — Z7901 Long term (current) use of anticoagulants: Secondary | ICD-10-CM

## 2023-12-18 DIAGNOSIS — I2510 Atherosclerotic heart disease of native coronary artery without angina pectoris: Secondary | ICD-10-CM | POA: Diagnosis present

## 2023-12-18 DIAGNOSIS — N1831 Chronic kidney disease, stage 3a: Secondary | ICD-10-CM | POA: Diagnosis present

## 2023-12-18 DIAGNOSIS — K219 Gastro-esophageal reflux disease without esophagitis: Secondary | ICD-10-CM | POA: Diagnosis present

## 2023-12-18 DIAGNOSIS — Z90710 Acquired absence of both cervix and uterus: Secondary | ICD-10-CM

## 2023-12-18 DIAGNOSIS — I2583 Coronary atherosclerosis due to lipid rich plaque: Secondary | ICD-10-CM | POA: Diagnosis not present

## 2023-12-18 DIAGNOSIS — E7800 Pure hypercholesterolemia, unspecified: Secondary | ICD-10-CM | POA: Diagnosis not present

## 2023-12-18 DIAGNOSIS — Z881 Allergy status to other antibiotic agents status: Secondary | ICD-10-CM

## 2023-12-18 DIAGNOSIS — E890 Postprocedural hypothyroidism: Secondary | ICD-10-CM | POA: Diagnosis present

## 2023-12-18 DIAGNOSIS — E271 Primary adrenocortical insufficiency: Secondary | ICD-10-CM

## 2023-12-18 DIAGNOSIS — R1111 Vomiting without nausea: Secondary | ICD-10-CM | POA: Diagnosis not present

## 2023-12-18 DIAGNOSIS — A0811 Acute gastroenteropathy due to Norwalk agent: Secondary | ICD-10-CM | POA: Diagnosis present

## 2023-12-18 DIAGNOSIS — Z882 Allergy status to sulfonamides status: Secondary | ICD-10-CM

## 2023-12-18 DIAGNOSIS — E861 Hypovolemia: Secondary | ICD-10-CM | POA: Diagnosis not present

## 2023-12-18 DIAGNOSIS — Z79890 Hormone replacement therapy (postmenopausal): Secondary | ICD-10-CM

## 2023-12-18 DIAGNOSIS — R079 Chest pain, unspecified: Secondary | ICD-10-CM | POA: Diagnosis not present

## 2023-12-18 DIAGNOSIS — R112 Nausea with vomiting, unspecified: Secondary | ICD-10-CM | POA: Diagnosis not present

## 2023-12-18 DIAGNOSIS — N183 Chronic kidney disease, stage 3 unspecified: Secondary | ICD-10-CM | POA: Diagnosis present

## 2023-12-18 DIAGNOSIS — I455 Other specified heart block: Secondary | ICD-10-CM | POA: Diagnosis not present

## 2023-12-18 DIAGNOSIS — M4722 Other spondylosis with radiculopathy, cervical region: Secondary | ICD-10-CM | POA: Diagnosis not present

## 2023-12-18 DIAGNOSIS — E785 Hyperlipidemia, unspecified: Secondary | ICD-10-CM | POA: Diagnosis not present

## 2023-12-18 DIAGNOSIS — E875 Hyperkalemia: Secondary | ICD-10-CM | POA: Diagnosis present

## 2023-12-18 DIAGNOSIS — Z888 Allergy status to other drugs, medicaments and biological substances status: Secondary | ICD-10-CM

## 2023-12-18 DIAGNOSIS — Z85828 Personal history of other malignant neoplasm of skin: Secondary | ICD-10-CM

## 2023-12-18 DIAGNOSIS — M797 Fibromyalgia: Secondary | ICD-10-CM | POA: Diagnosis present

## 2023-12-18 DIAGNOSIS — Z7951 Long term (current) use of inhaled steroids: Secondary | ICD-10-CM

## 2023-12-18 LAB — CBC WITH DIFFERENTIAL/PLATELET
Abs Immature Granulocytes: 0.14 10*3/uL — ABNORMAL HIGH (ref 0.00–0.07)
Basophils Absolute: 0.1 10*3/uL (ref 0.0–0.1)
Basophils Relative: 0 %
Eosinophils Absolute: 0.1 10*3/uL (ref 0.0–0.5)
Eosinophils Relative: 0 %
HCT: 39.4 % (ref 36.0–46.0)
Hemoglobin: 13.5 g/dL (ref 12.0–15.0)
Immature Granulocytes: 1 %
Lymphocytes Relative: 7 %
Lymphs Abs: 1.4 10*3/uL (ref 0.7–4.0)
MCH: 28.3 pg (ref 26.0–34.0)
MCHC: 34.3 g/dL (ref 30.0–36.0)
MCV: 82.6 fL (ref 80.0–100.0)
Monocytes Absolute: 1.3 10*3/uL — ABNORMAL HIGH (ref 0.1–1.0)
Monocytes Relative: 6 %
Neutro Abs: 18.1 10*3/uL — ABNORMAL HIGH (ref 1.7–7.7)
Neutrophils Relative %: 86 %
Platelets: 482 10*3/uL — ABNORMAL HIGH (ref 150–400)
RBC: 4.77 MIL/uL (ref 3.87–5.11)
RDW: 15.9 % — ABNORMAL HIGH (ref 11.5–15.5)
WBC: 21 10*3/uL — ABNORMAL HIGH (ref 4.0–10.5)
nRBC: 0 % (ref 0.0–0.2)

## 2023-12-18 LAB — URINALYSIS, W/ REFLEX TO CULTURE (INFECTION SUSPECTED)
Bilirubin Urine: NEGATIVE
Glucose, UA: NEGATIVE mg/dL
Ketones, ur: NEGATIVE mg/dL
Nitrite: NEGATIVE
Protein, ur: 100 mg/dL — AB
Specific Gravity, Urine: 1.009 (ref 1.005–1.030)
pH: 6 (ref 5.0–8.0)

## 2023-12-18 LAB — TSH: TSH: 5.76 u[IU]/mL — ABNORMAL HIGH (ref 0.350–4.500)

## 2023-12-18 LAB — TROPONIN I (HIGH SENSITIVITY)
Troponin I (High Sensitivity): 214 ng/L (ref <18)
Troponin I (High Sensitivity): 184 ng/L (ref <18)

## 2023-12-18 LAB — CBC
HCT: 36.4 % (ref 36.0–46.0)
Hemoglobin: 12.1 g/dL (ref 12.0–15.0)
MCH: 27.6 pg (ref 26.0–34.0)
MCHC: 33.2 g/dL (ref 30.0–36.0)
MCV: 83.1 fL (ref 80.0–100.0)
Platelets: 430 10*3/uL — ABNORMAL HIGH (ref 150–400)
RBC: 4.38 MIL/uL (ref 3.87–5.11)
RDW: 15.9 % — ABNORMAL HIGH (ref 11.5–15.5)
WBC: 21.1 10*3/uL — ABNORMAL HIGH (ref 4.0–10.5)
nRBC: 0 % (ref 0.0–0.2)

## 2023-12-18 LAB — CREATININE, SERUM
Creatinine, Ser: 1.44 mg/dL — ABNORMAL HIGH (ref 0.44–1.00)
GFR, Estimated: 38 mL/min — ABNORMAL LOW (ref >60)

## 2023-12-18 LAB — COMPREHENSIVE METABOLIC PANEL WITH GFR
ALT: 67 U/L — ABNORMAL HIGH (ref 0–44)
AST: 175 U/L — ABNORMAL HIGH (ref 15–41)
Albumin: 3.3 g/dL — ABNORMAL LOW (ref 3.5–5.0)
Alkaline Phosphatase: 59 U/L (ref 38–126)
Anion gap: 9 (ref 5–15)
BUN: 27 mg/dL — ABNORMAL HIGH (ref 8–23)
CO2: 15 mmol/L — ABNORMAL LOW (ref 22–32)
Calcium: 8.8 mg/dL — ABNORMAL LOW (ref 8.9–10.3)
Chloride: 113 mmol/L — ABNORMAL HIGH (ref 98–111)
Creatinine, Ser: 1.64 mg/dL — ABNORMAL HIGH (ref 0.44–1.00)
GFR, Estimated: 33 mL/min — ABNORMAL LOW (ref >60)
Glucose, Bld: 100 mg/dL — ABNORMAL HIGH (ref 70–99)
Potassium: <2 mmol/L — CL (ref 3.5–5.1)
Sodium: 137 mmol/L (ref 135–145)
Total Bilirubin: 0.5 mg/dL (ref 0.0–1.2)
Total Protein: 7.5 g/dL (ref 6.5–8.1)

## 2023-12-18 LAB — DIGOXIN LEVEL: Digoxin Level: 1.1 ng/mL (ref 0.8–2.0)

## 2023-12-18 LAB — I-STAT CG4 LACTIC ACID, ED: Lactic Acid, Venous: 1 mmol/L (ref 0.5–1.9)

## 2023-12-18 LAB — CBG MONITORING, ED: Glucose-Capillary: 96 mg/dL (ref 70–99)

## 2023-12-18 LAB — MAGNESIUM: Magnesium: 2.1 mg/dL (ref 1.7–2.4)

## 2023-12-18 MED ORDER — DIGOXIN 125 MCG PO TABS
0.1250 mg | ORAL_TABLET | Freq: Every day | ORAL | Status: DC
  Administered 2023-12-19: 0.125 mg via ORAL
  Filled 2023-12-18: qty 1

## 2023-12-18 MED ORDER — PANTOPRAZOLE SODIUM 40 MG PO TBEC
40.0000 mg | DELAYED_RELEASE_TABLET | Freq: Two times a day (BID) | ORAL | Status: DC
  Administered 2023-12-19 – 2023-12-22 (×7): 40 mg via ORAL
  Filled 2023-12-18 (×7): qty 1

## 2023-12-18 MED ORDER — ACETAMINOPHEN 325 MG PO TABS
650.0000 mg | ORAL_TABLET | Freq: Four times a day (QID) | ORAL | Status: DC | PRN
  Administered 2023-12-18 – 2023-12-21 (×4): 650 mg via ORAL
  Filled 2023-12-18 (×4): qty 2

## 2023-12-18 MED ORDER — LORATADINE 10 MG PO TABS
10.0000 mg | ORAL_TABLET | Freq: Every day | ORAL | Status: DC
  Administered 2023-12-19 – 2023-12-22 (×4): 10 mg via ORAL
  Filled 2023-12-18 (×4): qty 1

## 2023-12-18 MED ORDER — POTASSIUM CHLORIDE CRYS ER 20 MEQ PO TBCR
40.0000 meq | EXTENDED_RELEASE_TABLET | Freq: Once | ORAL | Status: AC
  Administered 2023-12-18: 40 meq via ORAL
  Filled 2023-12-18: qty 2

## 2023-12-18 MED ORDER — POLYETHYLENE GLYCOL 3350 17 G PO PACK: 17.0000 g | PACK | Freq: Every day | ORAL | Status: DC | PRN

## 2023-12-18 MED ORDER — TRAMADOL HCL 50 MG PO TABS: 50.0000 mg | ORAL_TABLET | Freq: Once | ORAL | Status: DC

## 2023-12-18 MED ORDER — LACTATED RINGERS IV BOLUS (SEPSIS)
500.0000 mL | Freq: Once | INTRAVENOUS | Status: AC
  Administered 2023-12-18: 500 mL via INTRAVENOUS

## 2023-12-18 MED ORDER — POTASSIUM CHLORIDE 10 MEQ/100ML IV SOLN
10.0000 meq | INTRAVENOUS | Status: AC
  Administered 2023-12-18 (×6): 10 meq via INTRAVENOUS
  Filled 2023-12-18 (×6): qty 100

## 2023-12-18 MED ORDER — ALBUTEROL SULFATE HFA 108 (90 BASE) MCG/ACT IN AERS: 2.0000 | INHALATION_SPRAY | Freq: Four times a day (QID) | RESPIRATORY_TRACT | Status: DC | PRN

## 2023-12-18 MED ORDER — ALPRAZOLAM 0.25 MG PO TABS
0.2500 mg | ORAL_TABLET | Freq: Two times a day (BID) | ORAL | Status: DC | PRN
  Administered 2023-12-18 – 2023-12-21 (×4): 0.25 mg via ORAL
  Filled 2023-12-18 (×4): qty 1

## 2023-12-18 MED ORDER — ACETAMINOPHEN 650 MG RE SUPP: 650.0000 mg | Freq: Four times a day (QID) | RECTAL | Status: DC | PRN

## 2023-12-18 MED ORDER — HYDROCORTISONE 10 MG PO TABS: 10.0000 mg | ORAL_TABLET | Freq: Two times a day (BID) | ORAL | Status: DC

## 2023-12-18 MED ORDER — LEVOTHYROXINE SODIUM 25 MCG PO TABS
25.0000 ug | ORAL_TABLET | Freq: Every day | ORAL | Status: DC
  Administered 2023-12-19 – 2023-12-22 (×4): 25 ug via ORAL
  Filled 2023-12-18 (×4): qty 1

## 2023-12-18 MED ORDER — SODIUM CHLORIDE (PF) 0.9 % IJ SOLN
INTRAMUSCULAR | Status: AC
Start: 2023-12-18 — End: ?
  Filled 2023-12-18: qty 50

## 2023-12-18 MED ORDER — FLUTICASONE FUROATE-VILANTEROL 200-25 MCG/ACT IN AEPB
1.0000 | INHALATION_SPRAY | Freq: Every day | RESPIRATORY_TRACT | Status: DC
  Administered 2023-12-19 – 2023-12-22 (×4): 1 via RESPIRATORY_TRACT
  Filled 2023-12-18: qty 28

## 2023-12-18 MED ORDER — SODIUM CHLORIDE 0.9 % IV SOLN: INTRAVENOUS | Status: DC

## 2023-12-18 MED ORDER — ENOXAPARIN SODIUM 30 MG/0.3ML IJ SOSY
30.0000 mg | PREFILLED_SYRINGE | INTRAMUSCULAR | Status: DC
  Administered 2023-12-18 – 2023-12-21 (×4): 30 mg via SUBCUTANEOUS
  Filled 2023-12-18 (×4): qty 0.3

## 2023-12-18 MED ORDER — ONDANSETRON HCL 4 MG PO TABS: 4.0000 mg | ORAL_TABLET | Freq: Four times a day (QID) | ORAL | Status: DC | PRN

## 2023-12-18 MED ORDER — METHYLPREDNISOLONE SODIUM SUCC 40 MG IJ SOLR
40.0000 mg | Freq: Two times a day (BID) | INTRAMUSCULAR | Status: AC
  Administered 2023-12-18 – 2023-12-19 (×2): 40 mg via INTRAVENOUS
  Filled 2023-12-18 (×2): qty 1

## 2023-12-18 MED ORDER — ATORVASTATIN CALCIUM 10 MG PO TABS
20.0000 mg | ORAL_TABLET | Freq: Every day | ORAL | Status: DC
  Administered 2023-12-19 – 2023-12-21 (×3): 20 mg via ORAL
  Filled 2023-12-18 (×3): qty 2

## 2023-12-18 MED ORDER — ALBUTEROL SULFATE (2.5 MG/3ML) 0.083% IN NEBU
2.5000 mg | INHALATION_SOLUTION | Freq: Four times a day (QID) | RESPIRATORY_TRACT | Status: DC | PRN
  Administered 2023-12-20: 2.5 mg via RESPIRATORY_TRACT
  Filled 2023-12-18: qty 3

## 2023-12-18 MED ORDER — DONEPEZIL HCL 5 MG PO TABS
5.0000 mg | ORAL_TABLET | Freq: Every day | ORAL | Status: DC
  Administered 2023-12-18 – 2023-12-21 (×4): 5 mg via ORAL
  Filled 2023-12-18 (×4): qty 1

## 2023-12-18 MED ORDER — TRAMADOL HCL 50 MG PO TABS
25.0000 mg | ORAL_TABLET | Freq: Once | ORAL | Status: AC
  Administered 2023-12-18: 25 mg via ORAL
  Filled 2023-12-18: qty 1

## 2023-12-18 MED ORDER — POTASSIUM CHLORIDE CRYS ER 20 MEQ PO TBCR
40.0000 meq | EXTENDED_RELEASE_TABLET | Freq: Two times a day (BID) | ORAL | Status: DC
  Administered 2023-12-18: 40 meq via ORAL
  Filled 2023-12-18: qty 2

## 2023-12-18 MED ORDER — ONDANSETRON HCL 4 MG/2ML IJ SOLN: 4.0000 mg | Freq: Four times a day (QID) | INTRAMUSCULAR | Status: DC | PRN

## 2023-12-18 MED ORDER — LEVOCETIRIZINE DIHYDROCHLORIDE 5 MG PO TABS: 5.0000 mg | ORAL_TABLET | Freq: Every evening | ORAL | Status: DC

## 2023-12-18 NOTE — H&P (Signed)
 History and Physical  [name]:[id number] DOB: Dec 31, 1950 DOA: 12/18/2023  PCP: Jess Morita, MD Patient coming from:   I have personally briefly reviewed patient's old medical records in [facility]   Chief Complaint: Nausea and vomting  HPI: Kaitlyn Adams is a 73 y.o. female past medical history of adrenal insufficiency on steroids, fibromyalgia, paroxysmal atrial fibrillation orthostatic hypotension chronic kidney disease who comes in for 3 weeks of weakness generalized poor appetite and unintentional weight loss, she relates that it got worse her weakness over the last 3 days she is not taking her steroids over the last 2 days.  She denies any fever, cough shortness of breath.  She relates her grandkids were sick.  In the ED: She was found tachycardic, orthostatics were positive, potassium was less than 2, creatinine 1.6 (with a baseline creatinine of less than 1, white count of 21,000 platelet count of 482 hemoglobin of 13, TSH was 5.7, multiple imaging CT spine L-spine C-spine and abdomen and pelvis showed no masses or lymphadenopathies.  CT head showed no acute findings    Review of Systems: All systems reviewed and apart from history of presenting illness, are negative.  Past Medical History:  Diagnosis Date   Addison disease (HCC)    Anemia    Asthma    - normal spirometry 2009 FEV1  >90% predicted.  - methacoline challenge neg 11/09   Bronchitis    recurrent   Cataract    Chronic cough    - sinus CT 03-23-10 > neg.  - allergy  profile 03-23-10 > nl, IgE 14.  - flutter valve rx 03-23-10   Fibromyalgia    GERD (gastroesophageal reflux disease)    exacerbating VCD   Glaucoma    History of blood transfusion    Hyperlipidemia    Hypertension    Hyperthyroidism    with hot nodule.  - total thyroidectomy 11-05-08 benign -> subsequent hypothyroidism.   Hypokalemia    Hypothyroidism    Hypothyroidism    a. following thyroidectomy.   Mild CAD     a. LHC 04/2015 - mild nonobstructive CAD with 30% dLAD, 40% D1, 25% pLCx, 30% mRCA, 30% RPDA, normal EF >65% with normal LVEDP.   Orthostatic hypotension    Paroxysmal atrial fibrillation (HCC)    Renal insufficiency    Vasomotor rhinitis    exacerbates VCD   Vocal cord dysfunction    proven on FOB 9/09   Past Surgical History:  Procedure Laterality Date   ABDOMINAL HYSTERECTOMY  1980   ABDOMINAL HYSTERECTOMY     BASAL CELL CARCINOMA EXCISION  11/08   BIOPSY  12/18/2022   Procedure: BIOPSY;  Surgeon: Kenney Peacemaker, MD;  Location: [facility];  Service: Gastroenterology;;   CARDIAC CATHETERIZATION N/A 05/15/2015   Procedure: Left Heart Cath and Coronary Angiography;  Surgeon: Arnoldo Lapping, MD;  Location: [facility] INVASIVE CV LAB;  Service: Cardiovascular;  Laterality: N/A;   COLONOSCOPY N/A 04/27/2017   Dr Leonia Raman for scant rectal bleeding and iron def anemia: Melanosis coli, lipomatous IC valve, left sided tics, non-bleeding hemorrhoid, 10 mm transverse polyp.     COLONOSCOPY WITH PROPOFOL  N/A 12/18/2022   Procedure: COLONOSCOPY WITH PROPOFOL ;  Surgeon: Kenney Peacemaker, MD;  Location: [facility] ENDOSCOPY;  Service: Gastroenterology;  Laterality: N/A;   ESOPHAGOGASTRODUODENOSCOPY N/A 04/27/2017   Sergio Dandy, MD; [facility] ENDOSCOPY. for iron def anemia.  Monilial/candidial esophagitis. Small HH.     ESOPHAGOGASTRODUODENOSCOPY (EGD) WITH PROPOFOL  N/A 12/18/2022  Procedure: ESOPHAGOGASTRODUODENOSCOPY (EGD) WITH PROPOFOL ;  Surgeon: Kenney Peacemaker, MD;  Location: [facility] ENDOSCOPY;  Service: Gastroenterology;  Laterality: N/A;   EYE SURGERY     POLYPECTOMY  12/18/2022   Procedure: POLYPECTOMY;  Surgeon: Kenney Peacemaker, MD;  Location: [facility] ENDOSCOPY;  Service: Gastroenterology;;   scar tissue removal  1982   THYROID  SURGERY  2010   THYROIDECTOMY  11-05-08   Social History:  reports that she has never smoked. She has never used smokeless tobacco. She reports current alcohol  use of about 1.0 standard drink of  alcohol  per week. She reports that she does not use drugs.   Allergies  Allergen Reactions   Fenofibrate  Other (See Comments)    GI side effects    Erythromycin Nausea And Vomiting    REACTION: nausea and vomiting   Guaifenesin  Er Palpitations   Sulfonamide Derivatives Nausea And Vomiting    REACTION: nausea and vomiting    Family History  Problem Relation Age of Onset   Asthma Mother    Heart disease Mother    Emphysema Father    Heart disease Father    Irregular heart beat Brother    Asthma Grandchild    Prior to Admission medications   Medication Sig Start Date End Date Taking? Authorizing Provider  albuterol  (VENTOLIN  HFA) 108 (90 Base) MCG/ACT inhaler INHALE 2 PUFFS INTO THE LUNGS EVERY 6 HOURS AS NEEDED FOR WHEEZING OR SHORTNESS OF BREATH 02/02/23   Tabori, Katherine E, MD  ALPRAZolam  (XANAX ) 0.25 MG tablet Take 1 tablet (0.25 mg total) by mouth 2 (two) times daily as needed for anxiety. 07/12/22   Tabori, Katherine E, MD  amitriptyline  (ELAVIL ) 50 MG tablet TAKE 1 TABLET BY MOUTH AT BEDTIME 11/24/23   Tabori, Katherine E, MD  atorvastatin  (LIPITOR) 20 MG tablet TAKE 1 TABLET(20 MG) BY MOUTH DAILY 11/28/23   Tabori, Katherine E, MD  digoxin  (LANOXIN ) 0.125 MG tablet TAKE 1 TABLET(0.125 MG) BY MOUTH DAILY 10/27/23   [place], Peter M, MD  diphenhydramine -acetaminophen  (TYLENOL  PM) 25-500 MG TABS tablet Take 1 tablet by mouth at bedtime as needed (pain).    [provider]  donepezil  (ARICEPT ) 5 MG tablet TAKE 1 TABLET(5 MG) BY MOUTH AT BEDTIME 11/06/23   Tabori, Katherine E, MD  ferrous sulfate  325 (65 FE) MG EC tablet Take 1 tablet (325 mg total) by mouth daily with breakfast. 12/18/22   Lonita Roach, MD  fluticasone -salmeterol (ADVAIR DISKUS) 250-50 MCG/ACT AEPB Inhale 1 puff into the lungs in the morning and at bedtime. 11/23/23   Desai, Nikita S, MD  furosemide  (LASIX ) 20 MG tablet TAKE 1 TABLET(20 MG) BY MOUTH DAILY AS NEEDED FOR SWELLING 11/23/23   [place], Peter M, MD   hydrocortisone  (CORTEF ) 10 MG tablet Take 10 mg by mouth 2 (two) times daily. Patient reports taking BID 09/07/2022 09/30/20   [provider]  hydroxypropyl methylcellulose / hypromellose (ISOPTO TEARS / GONIOVISC) 2.5 % ophthalmic solution Place 2 drops into both eyes daily as needed for dry eyes.    [provider]  levocetirizine (XYZAL ) 5 MG tablet Take 1 tablet (5 mg total) by mouth every evening. 06/28/23   Aleck Hurdle, MD  levothyroxine  (SYNTHROID ) 25 MCG tablet Take 25 mcg by mouth every morning. 08/16/22   [provider]  pantoprazole  (PROTONIX ) 40 MG tablet Take 1 tablet (40 mg total) by mouth 2 (two) times daily before a meal. 02/01/23   Aleck Hurdle, MD  potassium chloride  SA (KLOR-CON  M) 20 MEQ  tablet Take 2 tablets (40 mEq total) by mouth daily. 12/06/22   [place], Peter M, MD   Physical Exam: Vitals:   12/18/23 1515 12/18/23 1530 12/18/23 1644 12/18/23 1700  BP:  (!) 121/58    Pulse: 65 (!) 52  68  Resp: 19 13  20   Temp:   (!) 97.5 F (36.4 C)   TempSrc:   Oral   SpO2: 92% 93%  100%    General exam: Moderately built and nourished patient, lying comfortably supine on the gurney in no obvious distress. Head, eyes and ENT: Nontraumatic and normocephalic. Pupils equally reacting to light and accommodation.  Mucous members dry Neck: Supple. No JVD, carotid bruit or thyromegaly. Lymphatics: No lymphadenopathy. Respiratory system: Clear to auscultation. No increased work of breathing. Cardiovascular system: Positive as 1 S2 no murmurs rubs or gallops Gastrointestinal system: Abdomen is nondistended, soft and nontender. Normal bowel sounds heard. No organomegaly or masses appreciated. Central nervous system: Alert and oriented. No focal neurological deficits. Extremities: Symmetric 5 x 5 power. Peripheral pulses symmetrically felt.  Skin: No rashes or acute findings. Musculoskeletal system: Negative exam. Psychiatry: Pleasant and  cooperative.   Labs on Admission:  Basic Metabolic Panel: Recent Labs  Lab 12/18/23 1211  NA 137  K <2.0*  CL 113*  CO2 15*  GLUCOSE 100*  BUN 27*  CREATININE 1.64*  CALCIUM  8.8*  MG 2.1   Liver Function Tests: Recent Labs  Lab 12/18/23 1211  AST 175*  ALT 67*  ALKPHOS 59  BILITOT 0.5  PROT 7.5  ALBUMIN 3.3*   No results for input(s): "LIPASE", "AMYLASE" in the last 168 hours. No results for input(s): "AMMONIA" in the last 168 hours. CBC: Recent Labs  Lab 12/18/23 1211  WBC 21.0*  NEUTROABS 18.1*  HGB 13.5  HCT 39.4  MCV 82.6  PLT 482*   Cardiac Enzymes: No results for input(s): "CKTOTAL", "CKMB", "CKMBINDEX", "TROPONINI" in the last 168 hours.  BNP (last 3 results) No results for input(s): "PROBNP" in the last 8760 hours. CBG: Recent Labs  Lab 12/18/23 1216  GLUCAP 96    Radiological Exams on Admission: CT Cervical Spine Wo Contrast Result Date: 12/18/2023 CLINICAL DATA:  Weakness, pain, cervical radiculopathy EXAM: CT CERVICAL SPINE WITHOUT CONTRAST TECHNIQUE: Multidetector CT imaging of the cervical spine was performed without intravenous contrast. Multiplanar CT image reconstructions were also generated. RADIATION DOSE REDUCTION: This exam was performed according to the departmental dose-optimization program which includes automated exposure control, adjustment of the mA and/or kV according to patient size and/or use of iterative reconstruction technique. COMPARISON:  06/19/2022 FINDINGS: Alignment: Alignment is grossly anatomic. Skull base and vertebrae: No acute fracture. No primary bone lesion or focal pathologic process. Soft tissues and spinal canal: No prevertebral fluid or swelling. No visible canal hematoma. Disc levels: Stable hypertrophic changes at the C1-C2 interface. There is multilevel spondylosis most pronounced at C4-5, C5-6, and C6-7. Multilevel facet hypertrophy greatest at the C2-3, C3-4, and C4-5 levels on the right. No significant  change since prior exam. Upper chest: Airway is patent. Visualized portions of the lung apices are clear. Other: Reconstructed images demonstrate no additional findings. IMPRESSION: 1. No acute cervical spine fracture. 2. Stable multilevel spondylosis and facet hypertrophy as above. Electronically Signed   By: Bobbye Burrow M.D.   On: 12/18/2023 16:49   CT Head Wo Contrast Result Date: 12/18/2023 CLINICAL DATA:  Weakness, orthostatic hypotension, vomiting EXAM: CT HEAD WITHOUT CONTRAST TECHNIQUE: Contiguous axial images were obtained from the base of  the skull through the vertex without intravenous contrast. RADIATION DOSE REDUCTION: This exam was performed according to the departmental dose-optimization program which includes automated exposure control, adjustment of the mA and/or kV according to patient size and/or use of iterative reconstruction technique. COMPARISON:  06/19/2022 FINDINGS: Brain: No acute infarct or hemorrhage. Lateral ventricles and midline structures are unremarkable. No acute extra-axial fluid collections. No mass effect. Vascular: No hyperdense vessel or unexpected calcification. Skull: Normal. Negative for fracture or focal lesion. Sinuses/Orbits: No acute finding. Other: None. IMPRESSION: 1. No acute intracranial process. Electronically Signed   By: Bobbye Burrow M.D.   On: 12/18/2023 16:46   CT CHEST ABDOMEN PELVIS WO CONTRAST Result Date: 12/18/2023 CLINICAL DATA:  Weakness, pain, unintended weight loss * Tracking Code: BO * EXAM: CT CHEST, ABDOMEN AND PELVIS WITHOUT CONTRAST CT THORACIC AND LUMBAR SPINE WITHOUT CONTRAST TECHNIQUE: Multidetector CT imaging of the chest, abdomen and pelvis was performed following the standard protocol without IV contrast. Multidetector CT imaging of the thoracic and lumbar spine was performed following the standard protocol without IV contrast. RADIATION DOSE REDUCTION: This exam was performed according to the departmental dose-optimization program  which includes automated exposure control, adjustment of the mA and/or kV according to patient size and/or use of iterative reconstruction technique. COMPARISON:  Chest radiographs, 04/19/2023 FINDINGS: CT CHEST FINDINGS Cardiovascular: Aortic atherosclerosis. Normal heart size. Left and right coronary artery calcifications. No pericardial effusion. Mediastinum/Nodes: No enlarged mediastinal, hilar, or axillary lymph nodes. Surgical clips about the thyroid . Trachea, and esophagus demonstrate no significant findings. Lungs/Pleura: Lungs are clear. No pleural effusion or pneumothorax. Musculoskeletal: No chest wall mass or suspicious osseous lesions identified. CT ABDOMEN PELVIS FINDINGS Hepatobiliary: No solid liver abnormality is seen. No gallstones, gallbladder wall thickening, or biliary dilatation. Pancreas: Unremarkable. No pancreatic ductal dilatation or surrounding inflammatory changes. Spleen: Normal in size without significant abnormality. Adrenals/Urinary Tract: Adrenal glands are unremarkable. Kidneys are normal, without renal calculi, solid lesion, or hydronephrosis. Bladder is unremarkable. Stomach/Bowel: Stomach is within normal limits. Appendix appears normal. No evidence of bowel wall thickening, distention, or inflammatory changes. Vascular/Lymphatic: Aortic atherosclerosis. No enlarged abdominal or pelvic lymph nodes. Reproductive: Status post hysterectomy. Other: No abdominal wall hernia or abnormality. No ascites. Musculoskeletal: No acute osseous findings. CT THORACIC AND LUMBAR SPINE FINDINGS Alignment: Normal thoracic kyphosis. Normal lumbar lordosis. Vertebral bodies: No acute fracture. Superior endplate wedge deformity of L1 unchanged as compared to prior radiographs dated 04/19/2023. Disc spaces: Generally mild disc space height loss throughout the thoracic and lumbar spine, focally moderate at L5-S1. Mild anterior bridging osteophytosis throughout the thoracic spine in keeping with DISH.  Paraspinous soft tissues: Unremarkable. IMPRESSION: 1. No noncontrast CT evidence of mass or lymphadenopathy in the chest, abdomen, or pelvis. 2. No acute fracture or dislocation of the thoracic or lumbar spine. Superior endplate wedge deformity of L1 unchanged as compared to prior radiographs dated 04/19/2023. 3. Coronary artery disease. Aortic Atherosclerosis (ICD10-I70.0). Electronically Signed   By: Fredricka Jenny M.D.   On: 12/18/2023 16:29   CT T-SPINE NO CHARGE Result Date: 12/18/2023 CLINICAL DATA:  Weakness, pain, unintended weight loss * Tracking Code: BO * EXAM: CT CHEST, ABDOMEN AND PELVIS WITHOUT CONTRAST CT THORACIC AND LUMBAR SPINE WITHOUT CONTRAST TECHNIQUE: Multidetector CT imaging of the chest, abdomen and pelvis was performed following the standard protocol without IV contrast. Multidetector CT imaging of the thoracic and lumbar spine was performed following the standard protocol without IV contrast. RADIATION DOSE REDUCTION: This exam was performed according to the departmental  dose-optimization program which includes automated exposure control, adjustment of the mA and/or kV according to patient size and/or use of iterative reconstruction technique. COMPARISON:  Chest radiographs, 04/19/2023 FINDINGS: CT CHEST FINDINGS Cardiovascular: Aortic atherosclerosis. Normal heart size. Left and right coronary artery calcifications. No pericardial effusion. Mediastinum/Nodes: No enlarged mediastinal, hilar, or axillary lymph nodes. Surgical clips about the thyroid . Trachea, and esophagus demonstrate no significant findings. Lungs/Pleura: Lungs are clear. No pleural effusion or pneumothorax. Musculoskeletal: No chest wall mass or suspicious osseous lesions identified. CT ABDOMEN PELVIS FINDINGS Hepatobiliary: No solid liver abnormality is seen. No gallstones, gallbladder wall thickening, or biliary dilatation. Pancreas: Unremarkable. No pancreatic ductal dilatation or surrounding inflammatory changes.  Spleen: Normal in size without significant abnormality. Adrenals/Urinary Tract: Adrenal glands are unremarkable. Kidneys are normal, without renal calculi, solid lesion, or hydronephrosis. Bladder is unremarkable. Stomach/Bowel: Stomach is within normal limits. Appendix appears normal. No evidence of bowel wall thickening, distention, or inflammatory changes. Vascular/Lymphatic: Aortic atherosclerosis. No enlarged abdominal or pelvic lymph nodes. Reproductive: Status post hysterectomy. Other: No abdominal wall hernia or abnormality. No ascites. Musculoskeletal: No acute osseous findings. CT THORACIC AND LUMBAR SPINE FINDINGS Alignment: Normal thoracic kyphosis. Normal lumbar lordosis. Vertebral bodies: No acute fracture. Superior endplate wedge deformity of L1 unchanged as compared to prior radiographs dated 04/19/2023. Disc spaces: Generally mild disc space height loss throughout the thoracic and lumbar spine, focally moderate at L5-S1. Mild anterior bridging osteophytosis throughout the thoracic spine in keeping with DISH. Paraspinous soft tissues: Unremarkable. IMPRESSION: 1. No noncontrast CT evidence of mass or lymphadenopathy in the chest, abdomen, or pelvis. 2. No acute fracture or dislocation of the thoracic or lumbar spine. Superior endplate wedge deformity of L1 unchanged as compared to prior radiographs dated 04/19/2023. 3. Coronary artery disease. Aortic Atherosclerosis (ICD10-I70.0). Electronically Signed   By: Fredricka Jenny M.D.   On: 12/18/2023 16:29   CT L-SPINE NO CHARGE Result Date: 12/18/2023 CLINICAL DATA:  Weakness, pain, unintended weight loss * Tracking Code: BO * EXAM: CT CHEST, ABDOMEN AND PELVIS WITHOUT CONTRAST CT THORACIC AND LUMBAR SPINE WITHOUT CONTRAST TECHNIQUE: Multidetector CT imaging of the chest, abdomen and pelvis was performed following the standard protocol without IV contrast. Multidetector CT imaging of the thoracic and lumbar spine was performed following the standard  protocol without IV contrast. RADIATION DOSE REDUCTION: This exam was performed according to the departmental dose-optimization program which includes automated exposure control, adjustment of the mA and/or kV according to patient size and/or use of iterative reconstruction technique. COMPARISON:  Chest radiographs, 04/19/2023 FINDINGS: CT CHEST FINDINGS Cardiovascular: Aortic atherosclerosis. Normal heart size. Left and right coronary artery calcifications. No pericardial effusion. Mediastinum/Nodes: No enlarged mediastinal, hilar, or axillary lymph nodes. Surgical clips about the thyroid . Trachea, and esophagus demonstrate no significant findings. Lungs/Pleura: Lungs are clear. No pleural effusion or pneumothorax. Musculoskeletal: No chest wall mass or suspicious osseous lesions identified. CT ABDOMEN PELVIS FINDINGS Hepatobiliary: No solid liver abnormality is seen. No gallstones, gallbladder wall thickening, or biliary dilatation. Pancreas: Unremarkable. No pancreatic ductal dilatation or surrounding inflammatory changes. Spleen: Normal in size without significant abnormality. Adrenals/Urinary Tract: Adrenal glands are unremarkable. Kidneys are normal, without renal calculi, solid lesion, or hydronephrosis. Bladder is unremarkable. Stomach/Bowel: Stomach is within normal limits. Appendix appears normal. No evidence of bowel wall thickening, distention, or inflammatory changes. Vascular/Lymphatic: Aortic atherosclerosis. No enlarged abdominal or pelvic lymph nodes. Reproductive: Status post hysterectomy. Other: No abdominal wall hernia or abnormality. No ascites. Musculoskeletal: No acute osseous findings. CT THORACIC AND LUMBAR SPINE FINDINGS  Alignment: Normal thoracic kyphosis. Normal lumbar lordosis. Vertebral bodies: No acute fracture. Superior endplate wedge deformity of L1 unchanged as compared to prior radiographs dated 04/19/2023. Disc spaces: Generally mild disc space height loss throughout the thoracic  and lumbar spine, focally moderate at L5-S1. Mild anterior bridging osteophytosis throughout the thoracic spine in keeping with DISH. Paraspinous soft tissues: Unremarkable. IMPRESSION: 1. No noncontrast CT evidence of mass or lymphadenopathy in the chest, abdomen, or pelvis. 2. No acute fracture or dislocation of the thoracic or lumbar spine. Superior endplate wedge deformity of L1 unchanged as compared to prior radiographs dated 04/19/2023. 3. Coronary artery disease. Aortic Atherosclerosis (ICD10-I70.0). Electronically Signed   By: Fredricka Jenny M.D.   On: 12/18/2023 16:29   DG Chest Port 1 View Result Date: 12/18/2023 CLINICAL DATA:  Questionable sepsis - evaluate for abnormality EXAM: PORTABLE CHEST - 1 VIEW COMPARISON:  None available. FINDINGS: No focal airspace consolidation, pleural effusion, or pneumothorax. No cardiomegaly. Aortic atherosclerosis. No acute fracture or destructive lesion. Multilevel thoracic osteophytosis. IMPRESSION: No acute cardiopulmonary abnormality. Electronically Signed   By: Rance Burrows M.D.   On: 12/18/2023 13:36    EKG: Independently reviewed.  Twelve-lead EKG shows U waves, prolonged PR interval and QRS  Assessment/Plan Severe hypokalemia: Will go ahead and replete orally and IV recheck in the morning, her magnesium  was 2.1 which is reassuring. Is likely due to nausea and vomiting. Hold Elavil  due to her prolonged QRS and intervals  Nausea and vomiting possibly due to adrenal insufficiency plus or minus viral infection Started back on her steroids IV. Will switch her to p.o. now that she is able to take orals. Use nausea for vomiting Check GI panel.  Acute kidney injury on CKD (chronic kidney disease) stage 3, GFR 30-59 ml/min (HCC) Likely hemodynamically mediated, with a baseline creatinine 1.2 on admission 1.6 we will start her on Florinef  and IV fluids and recheck in the morning. Hold diuretic therapy.  Leukocytosis: Upon clear etiology, question  reactive, she has remained afebrile. Check a GI panel UA shows no signs infection, chest x-ray showed no infiltrates she denies any cough and fever.  Orthostatic hypotension: Likely due to hypovolemia due to decreased oral intake nausea and vomiting fluid resuscitated aggressively and recheck in the morning.  Paroxysmal atrial fibrillation (HCC) In sinus rhythm not on anticoagulation continue digoxin .  Tachycardia has resolved with IV fluids.  Thrombocytopenia: Likely reactive for associated started on steroids recheck CBC tomorrow morning.    DVT Prophylaxis: lovenox  Code Status: full  Family Communication: none  Disposition Plan: inpatient      It is my clinical opinion that admission to INPATIENT is reasonable and necessary in this 73 y.o. female acute kidney injury hyperkalemia likely in the setting of adrenal insufficiency  Given the aforementioned, the predictability of an adverse outcome is felt to be significant. I expect that the patient will require at least 2 midnights in the hospital to treat this condition.  Macdonald Savoy MD [facility]   12/18/2023, 5:59 PM

## 2023-12-18 NOTE — ED Triage Notes (Addendum)
 BIBA from home for weakness, pain all over since last week. Pt is supposed to take a steroid twice a day, but has missed some doses. EMS reports pt was orthostatic, had one episode of vomiting en route- 4 mg zofran , 125 solu medrol  given PTA. Hx of Addison's, Afib, HTN. 132 cbg 80 HR irr.

## 2023-12-18 NOTE — ED Provider Notes (Signed)
 [place] EMERGENCY DEPARTMENT AT [facility] Provider Note   CSN: [id number] Arrival date & time: 12/18/23  1134     History  Chief Complaint  Patient presents with   Weakness    Kaitlyn Adams is a 73 y.o. female.   Weakness Associated symptoms: nausea and vomiting   Patient presents for weakness.  Medical history includes adrenal insufficiency, fibromyalgia, GERD, atrial fibrillation, orthostatic hypotension, CAD, HLD, anxiety, depression, anemia, CKD.  Home medications include digoxin , Lasix , Synthroid , hydrocortisone .  Over the past 3 weeks, patient has had worsening generalized weakness, poor appetite, unintentional weight loss of 15 to 20 pounds.  She is normally ambulatory without any assistive devices.  Over the past several days, she has required use of a walker.  She states that she "hurts all over", but nowhere in particular.  With EMS, she did have an episode of nausea and vomiting.  She was given Zofran  prior to arrival.  Nausea is currently resolved.     Home Medications Prior to Admission medications   Medication Sig Start Date End Date Taking? Authorizing Provider  albuterol  (VENTOLIN  HFA) 108 (90 Base) MCG/ACT inhaler INHALE 2 PUFFS INTO THE LUNGS EVERY 6 HOURS AS NEEDED FOR WHEEZING OR SHORTNESS OF BREATH 02/02/23   Tabori, Katherine E, MD  ALPRAZolam  (XANAX ) 0.25 MG tablet Take 1 tablet (0.25 mg total) by mouth 2 (two) times daily as needed for anxiety. 07/12/22   Tabori, Katherine E, MD  amitriptyline  (ELAVIL ) 50 MG tablet TAKE 1 TABLET BY MOUTH AT BEDTIME 11/24/23   Tabori, Katherine E, MD  atorvastatin  (LIPITOR) 20 MG tablet TAKE 1 TABLET(20 MG) BY MOUTH DAILY 11/28/23   Tabori, Katherine E, MD  digoxin  (LANOXIN ) 0.125 MG tablet TAKE 1 TABLET(0.125 MG) BY MOUTH DAILY 10/27/23   [place], Peter M, MD  diphenhydramine -acetaminophen  (TYLENOL  PM) 25-500 MG TABS tablet Take 1 tablet by mouth at bedtime as needed (pain).    [provider]  donepezil   (ARICEPT ) 5 MG tablet TAKE 1 TABLET(5 MG) BY MOUTH AT BEDTIME 11/06/23   Tabori, Katherine E, MD  ferrous sulfate  325 (65 FE) MG EC tablet Take 1 tablet (325 mg total) by mouth daily with breakfast. 12/18/22   Lonita Roach, MD  fluticasone -salmeterol (ADVAIR DISKUS) 250-50 MCG/ACT AEPB Inhale 1 puff into the lungs in the morning and at bedtime. 11/23/23   Aleck Hurdle, MD  furosemide  (LASIX ) 20 MG tablet TAKE 1 TABLET(20 MG) BY MOUTH DAILY AS NEEDED FOR SWELLING 11/23/23   [place], Peter M, MD  hydrocortisone  (CORTEF ) 10 MG tablet Take 10 mg by mouth 2 (two) times daily. Patient reports taking BID 09/07/2022 09/30/20   [provider]  hydroxypropyl methylcellulose / hypromellose (ISOPTO TEARS / GONIOVISC) 2.5 % ophthalmic solution Place 2 drops into both eyes daily as needed for dry eyes.    [provider]  levocetirizine (XYZAL ) 5 MG tablet Take 1 tablet (5 mg total) by mouth every evening. 06/28/23   Aleck Hurdle, MD  levothyroxine  (SYNTHROID ) 25 MCG tablet Take 25 mcg by mouth every morning. 08/16/22   [provider]  pantoprazole  (PROTONIX ) 40 MG tablet Take 1 tablet (40 mg total) by mouth 2 (two) times daily before a meal. 02/01/23   Aleck Hurdle, MD  potassium chloride  SA (KLOR-CON  M) 20 MEQ tablet Take 2 tablets (40 mEq total) by mouth daily. 12/06/22   [place], Peter M, MD      Allergies    Fenofibrate , Erythromycin, Guaifenesin  er, and  Sulfonamide derivatives    Review of Systems   Review of Systems  Constitutional:  Positive for appetite change, fatigue and unexpected weight change.  Gastrointestinal:  Positive for nausea and vomiting.  Neurological:  Positive for weakness.  All other systems reviewed and are negative.   Physical Exam Updated Vital Signs BP (!) 121/58   Pulse (!) 52   Temp 98 F (36.7 C) (Oral)   Resp 13   SpO2 93%  Physical Exam Vitals and nursing note reviewed.  Constitutional:      General: She is not in acute distress.     Appearance: Normal appearance. She is well-developed. She is not ill-appearing, toxic-appearing or diaphoretic.  HENT:     Head: Normocephalic and atraumatic.     Right Ear: External ear normal.     Left Ear: External ear normal.     Nose: Nose normal.     Mouth/Throat:     Mouth: Mucous membranes are moist.  Eyes:     Extraocular Movements: Extraocular movements intact.     Conjunctiva/sclera: Conjunctivae normal.  Cardiovascular:     Rate and Rhythm: Normal rate and regular rhythm.     Heart sounds: No murmur heard. Pulmonary:     Effort: Pulmonary effort is normal. No respiratory distress.     Breath sounds: Normal breath sounds. No wheezing or rales.  Chest:     Chest wall: No tenderness.  Abdominal:     General: There is no distension.     Palpations: Abdomen is soft.     Tenderness: There is no abdominal tenderness.  Musculoskeletal:        General: No swelling or deformity.     Cervical back: Normal range of motion and neck supple.     Right lower leg: No edema.     Left lower leg: No edema.  Skin:    General: Skin is warm and dry.     Coloration: Skin is not jaundiced or pale.  Neurological:     Mental Status: She is alert and oriented to person, place, and time.     Cranial Nerves: No cranial nerve deficit.     Sensory: No sensory deficit.     Motor: Weakness (Bilateral lower extremity weakness) present.     Coordination: Coordination abnormal (Mild dysmetria on right upper extremity).  Psychiatric:        Mood and Affect: Mood normal.        Behavior: Behavior normal.     ED Results / Procedures / Treatments   Labs (all labs ordered are listed, but only abnormal results are displayed) Labs Reviewed  COMPREHENSIVE METABOLIC PANEL WITH GFR - Abnormal; Notable for the following components:      Result Value   Potassium <2.0 (*)    Chloride 113 (*)    CO2 15 (*)    Glucose, Bld 100 (*)    BUN 27 (*)    Creatinine, Ser 1.64 (*)    Calcium  8.8 (*)     Albumin 3.3 (*)    AST 175 (*)    ALT 67 (*)    GFR, Estimated 33 (*)    All other components within normal limits  CBC WITH DIFFERENTIAL/PLATELET - Abnormal; Notable for the following components:   WBC 21.0 (*)    RDW 15.9 (*)    Platelets 482 (*)    Neutro Abs 18.1 (*)    Monocytes Absolute 1.3 (*)    Abs Immature Granulocytes 0.14 (*)  All other components within normal limits  TSH - Abnormal; Notable for the following components:   TSH 5.760 (*)    All other components within normal limits  TROPONIN I (HIGH SENSITIVITY) - Abnormal; Notable for the following components:   Troponin I (High Sensitivity) 214 (*)    All other components within normal limits  TROPONIN I (HIGH SENSITIVITY) - Abnormal; Notable for the following components:   Troponin I (High Sensitivity) 184 (*)    All other components within normal limits  MAGNESIUM   DIGOXIN  LEVEL  URINALYSIS, W/ REFLEX TO CULTURE (INFECTION SUSPECTED)  I-STAT CG4 LACTIC ACID, ED  CBG MONITORING, ED  I-STAT CG4 LACTIC ACID, ED    EKG None  Radiology DG Chest Port 1 View Result Date: 12/18/2023 CLINICAL DATA:  Questionable sepsis - evaluate for abnormality EXAM: PORTABLE CHEST - 1 VIEW COMPARISON:  None available. FINDINGS: No focal airspace consolidation, pleural effusion, or pneumothorax. No cardiomegaly. Aortic atherosclerosis. No acute fracture or destructive lesion. Multilevel thoracic osteophytosis. IMPRESSION: No acute cardiopulmonary abnormality. Electronically Signed   By: Rance Burrows M.D.   On: 12/18/2023 13:36    Procedures Procedures    Medications Ordered in ED Medications  potassium chloride  10 mEq in 100 mL IVPB (10 mEq Intravenous New Bag/Given 12/18/23 1515)  0.9 %  sodium chloride  infusion ( Intravenous New Bag/Given 12/18/23 1405)  lactated ringers  bolus 500 mL (0 mLs Intravenous Stopped 12/18/23 1236)  potassium chloride  SA (KLOR-CON  M) CR tablet 40 mEq (40 mEq Oral Given 12/18/23 1405)    ED  Course/ Medical Decision Making/ A&P                                 Medical Decision Making Amount and/or Complexity of Data Reviewed Labs: ordered. Radiology: ordered.  Risk Prescription drug management.   This patient presents to the ED for concern of generalized weakness, this involves an extensive number of treatment options, and is a complaint that carries with it a high risk of complications and morbidity.  The differential diagnosis includes infection, metabolic derangements, neoplasm, deconditioning, dehydration   Co morbidities that complicate the patient evaluation  adrenal insufficiency, fibromyalgia, GERD, atrial fibrillation, orthostatic hypotension, CAD, HLD, anxiety, depression, anemia, CKD   Additional history obtained:  Additional history obtained from patient's husband External records from outside source obtained and reviewed including EMR   Lab Tests:  I Ordered, and personally interpreted labs.  The pertinent results include: Leukocytosis and thrombocytosis are present suggestive of inflammatory response.  AKI is present.  Patient has severe range hypokalemia.  Non-anion gap metabolic acidosis is present.  Transaminases are mildly elevated.  Troponin is elevated but decreased on repeat.   Imaging Studies ordered:  I ordered imaging studies including chest x-ray, CT of head, cervical spine, chest, abdomen, pelvis, T-spine, L-spine I independently visualized and interpreted imaging which showed no acute findings on x-ray, CT imaging pending at time of signout. I agree with the radiologist interpretation   Cardiac Monitoring: / EKG:  The patient was maintained on a cardiac monitor.  I personally viewed and interpreted the cardiac monitored which showed an underlying rhythm of: Sinus rhythm   Problem List / ED Course / Critical interventions / Medication management  Patient presenting for progressive generalized weakness over the past 3 weeks.  On  arrival in the ED, vital signs are normal.  Patient is overall well-appearing on exam.  She endorses diffuse pain but appears  comfortable.  Abdomen is soft and nontender.  Breathing is unlabored.  Lungs are clear to auscultation.  On neuroexam, she does have some slight dysmetria with the right upper extremity.  She has 3/5 strength in bilateral lower extremities.  She denies any areas of diminished sensation.  She does seem to be hyperreflexic in her lower extremities.  Workup was initiated.  Patient's lab work showed multiple abnormalities.  Her potassium is severely low.  An AKI is present.  Additional IV fluids and potassium replacement were ordered.  She has a non-anion gap metabolic acidosis in addition to mild elevation in transaminases.  A large leukocytosis is present, however, no clear source of infection has yet been identified.  Her initial troponin was elevated in the range of 200.  On repeat, there is a mild decrease.  Vital signs remained normal.  Imaging studies were pending at time of signout.  Care of patient signed out to oncoming ED provider. I ordered medication including IV fluids for hydration; potassium chloride  for hypokalemia Reevaluation of the patient after these medicines showed that the patient improved I have reviewed the patients home medicines and have made adjustments as needed  Social Determinants of Health:  Lives at home with husband  CRITICAL CARE Performed by: Iva Mariner   Total critical care time: 32 minutes  Critical care time was exclusive of separately billable procedures and treating other patients.  Critical care was necessary to treat or prevent imminent or life-threatening deterioration.  Critical care was time spent personally by me on the following activities: development of treatment plan with patient and/or surrogate as well as nursing, discussions with consultants, evaluation of patient's response to treatment, examination of patient, obtaining  history from patient or surrogate, ordering and performing treatments and interventions, ordering and review of laboratory studies, ordering and review of radiographic studies, pulse oximetry and re-evaluation of patient's condition.        Final Clinical Impression(s) / ED Diagnoses Final diagnoses:  Generalized weakness  Hypokalemia  AKI (acute kidney injury) (HCC)  Elevated troponin    Rx / DC Orders ED Discharge Orders     None         Iva Mariner, MD 12/18/23 1616

## 2023-12-18 NOTE — ED Notes (Signed)
 Pt went to the restroom, but forgot to obtain urine sample

## 2023-12-19 ENCOUNTER — Other Ambulatory Visit (HOSPITAL_COMMUNITY)

## 2023-12-19 DIAGNOSIS — R079 Chest pain, unspecified: Secondary | ICD-10-CM | POA: Diagnosis not present

## 2023-12-19 DIAGNOSIS — E876 Hypokalemia: Secondary | ICD-10-CM | POA: Diagnosis not present

## 2023-12-19 DIAGNOSIS — E785 Hyperlipidemia, unspecified: Secondary | ICD-10-CM | POA: Diagnosis not present

## 2023-12-19 DIAGNOSIS — I951 Orthostatic hypotension: Secondary | ICD-10-CM

## 2023-12-19 DIAGNOSIS — R7989 Other specified abnormal findings of blood chemistry: Secondary | ICD-10-CM | POA: Diagnosis not present

## 2023-12-19 DIAGNOSIS — N179 Acute kidney failure, unspecified: Secondary | ICD-10-CM | POA: Diagnosis not present

## 2023-12-19 DIAGNOSIS — I2510 Atherosclerotic heart disease of native coronary artery without angina pectoris: Secondary | ICD-10-CM | POA: Diagnosis not present

## 2023-12-19 DIAGNOSIS — R531 Weakness: Secondary | ICD-10-CM | POA: Diagnosis not present

## 2023-12-19 LAB — CBC
HCT: 33.4 % — ABNORMAL LOW (ref 36.0–46.0)
Hemoglobin: 11 g/dL — ABNORMAL LOW (ref 12.0–15.0)
MCH: 28.4 pg (ref 26.0–34.0)
MCHC: 32.9 g/dL (ref 30.0–36.0)
MCV: 86.3 fL (ref 80.0–100.0)
Platelets: 404 10*3/uL — ABNORMAL HIGH (ref 150–400)
RBC: 3.87 MIL/uL (ref 3.87–5.11)
RDW: 16.5 % — ABNORMAL HIGH (ref 11.5–15.5)
WBC: 26.3 10*3/uL — ABNORMAL HIGH (ref 4.0–10.5)
nRBC: 0 % (ref 0.0–0.2)

## 2023-12-19 LAB — RETICULOCYTES
Immature Retic Fract: 20.8 % — ABNORMAL HIGH (ref 2.3–15.9)
RBC.: 3.93 MIL/uL (ref 3.87–5.11)
Retic Count, Absolute: 82.1 10*3/uL (ref 19.0–186.0)
Retic Ct Pct: 2.1 % (ref 0.4–3.1)

## 2023-12-19 LAB — TROPONIN I (HIGH SENSITIVITY)
Troponin I (High Sensitivity): 138 ng/L (ref <18)
Troponin I (High Sensitivity): 139 ng/L (ref <18)

## 2023-12-19 LAB — BASIC METABOLIC PANEL WITH GFR
Anion gap: 8 (ref 5–15)
BUN: 27 mg/dL — ABNORMAL HIGH (ref 8–23)
CO2: 13 mmol/L — ABNORMAL LOW (ref 22–32)
Calcium: 7.7 mg/dL — ABNORMAL LOW (ref 8.9–10.3)
Chloride: 118 mmol/L — ABNORMAL HIGH (ref 98–111)
Creatinine, Ser: 1.51 mg/dL — ABNORMAL HIGH (ref 0.44–1.00)
GFR, Estimated: 36 mL/min — ABNORMAL LOW (ref >60)
Glucose, Bld: 112 mg/dL — ABNORMAL HIGH (ref 70–99)
Potassium: 2.6 mmol/L — CL (ref 3.5–5.1)
Sodium: 139 mmol/L (ref 135–145)

## 2023-12-19 LAB — VITAMIN B12: Vitamin B-12: 201 pg/mL (ref 180–914)

## 2023-12-19 LAB — FERRITIN: Ferritin: 17 ng/mL (ref 11–307)

## 2023-12-19 LAB — IRON AND TIBC
Iron: 28 ug/dL (ref 28–170)
Saturation Ratios: 10 % — ABNORMAL LOW (ref 10.4–31.8)
TIBC: 280 ug/dL (ref 250–450)
UIBC: 252 ug/dL

## 2023-12-19 LAB — FOLATE: Folate: 8.7 ng/mL (ref >5.9)

## 2023-12-19 MED ORDER — METOPROLOL TARTRATE 25 MG PO TABS: 25.0000 mg | ORAL_TABLET | Freq: Two times a day (BID) | ORAL | Status: DC

## 2023-12-19 MED ORDER — SODIUM CHLORIDE 0.9 % IV BOLUS
500.0000 mL | Freq: Once | INTRAVENOUS | Status: AC
  Administered 2023-12-19: 500 mL via INTRAVENOUS

## 2023-12-19 MED ORDER — DEXTROSE-SODIUM CHLORIDE 2.5-0.45 % IV SOLN: INTRAVENOUS | Status: DC

## 2023-12-19 MED ORDER — POTASSIUM CHLORIDE 20 MEQ PO PACK
60.0000 meq | PACK | Freq: Once | ORAL | Status: AC
  Administered 2023-12-19: 60 meq via ORAL
  Filled 2023-12-19: qty 3

## 2023-12-19 MED ORDER — CYANOCOBALAMIN 1000 MCG/ML IJ SOLN
1000.0000 ug | Freq: Every day | INTRAMUSCULAR | Status: DC
  Administered 2023-12-19 – 2023-12-21 (×3): 1000 ug via SUBCUTANEOUS
  Filled 2023-12-19 (×4): qty 1

## 2023-12-19 MED ORDER — NITROGLYCERIN 0.4 MG SL SUBL
0.4000 mg | SUBLINGUAL_TABLET | SUBLINGUAL | Status: DC | PRN
  Filled 2023-12-19: qty 1

## 2023-12-19 MED ORDER — POTASSIUM CHLORIDE CRYS ER 20 MEQ PO TBCR
40.0000 meq | EXTENDED_RELEASE_TABLET | Freq: Two times a day (BID) | ORAL | Status: AC
  Administered 2023-12-19 (×2): 40 meq via ORAL
  Filled 2023-12-19 (×2): qty 2

## 2023-12-19 MED ORDER — SODIUM CHLORIDE 0.45 % IV SOLN: INTRAVENOUS | Status: DC

## 2023-12-19 MED ORDER — ASPIRIN 325 MG PO TABS
325.0000 mg | ORAL_TABLET | Freq: Every day | ORAL | Status: DC
  Administered 2023-12-19 – 2023-12-22 (×4): 325 mg via ORAL
  Filled 2023-12-19 (×4): qty 1

## 2023-12-19 MED ORDER — HYDROCORTISONE 10 MG PO TABS: 10.0000 mg | ORAL_TABLET | Freq: Two times a day (BID) | ORAL | Status: DC

## 2023-12-19 MED ORDER — HYDROCORTISONE 10 MG PO TABS
10.0000 mg | ORAL_TABLET | Freq: Two times a day (BID) | ORAL | Status: DC
  Administered 2023-12-19 – 2023-12-22 (×6): 10 mg via ORAL
  Filled 2023-12-19 (×6): qty 1

## 2023-12-19 MED ORDER — POTASSIUM CHLORIDE CRYS ER 20 MEQ PO TBCR: 40.0000 meq | EXTENDED_RELEASE_TABLET | Freq: Two times a day (BID) | ORAL | Status: DC

## 2023-12-19 MED ORDER — HYDROCORTISONE 20 MG PO TABS: 20.0000 mg | ORAL_TABLET | Freq: Two times a day (BID) | ORAL | Status: DC

## 2023-12-19 NOTE — NC FL2 (Signed)
 [place]  MEDICAID FL2 LEVEL OF CARE FORM     IDENTIFICATION  Patient Name: Kaitlyn Adams Birthdate: [birth year] Sex: female Admission Date (Current Location): 12/18/2023  [facility] and [place] Number:  Producer, television/film/video and Address:  [facility],  [address] [place], [place] [address]      Provider Number: [id number]  Attending Physician Name and Address:  Macdonald Savoy, MD  Relative Name and Phone Number:  Keshawn, Kellas (Spouse)  [phone]    Current Level of Care: Hospital Recommended Level of Care: Skilled Nursing Facility Prior Approval Number:    Date Approved/Denied:   PASRR Number: Pending  Discharge Plan: SNF    Current Diagnoses: Patient Active Problem List   Diagnosis Date Noted   AKI (acute kidney injury) (HCC) 12/18/2023   Nausea and vomiting 12/18/2023   Frequent falls 02/07/2023   Candida esophagitis (HCC) 12/18/2022   Benign neoplasm of ascending colon 12/18/2022   Benign neoplasm of transverse colon 12/18/2022   Anal stricture 12/18/2022   CKD (chronic kidney disease) stage 3, GFR 30-59 ml/min (HCC) 12/17/2022   Adrenal insufficiency (HCC) 11/06/2020   Disorder of bone 11/06/2020   Disorder of calcium  metabolism 11/06/2020   Vitamin D  deficiency 11/06/2020   Hypokalemia 10/25/2018   Moderate protein-calorie malnutrition (HCC) 08/01/2018   Upper airway cough syndrome 05/22/2017   Symptomatic anemia 04/26/2017   Chronic constipation 07/07/2016   History of colonic polyps 07/07/2016   Chronic anticoagulation 07/07/2016   Iron deficiency anemia 06/15/2016   Anxiety and depression 05/18/2016   Physical exam 11/18/2015   Pain in joint, ankle and foot 11/16/2015   PAT (paroxysmal atrial tachycardia) (HCC) 09/08/2015   Hyperlipidemia 07/30/2015   CAD (coronary artery disease)    Chest pain at rest, not due to CAD, possible due to a fib. 05/15/2015   Chronic bronchitis (HCC) 06/17/2011   Orthostatic hypotension 12/06/2010    Paroxysmal atrial fibrillation (HCC)    Renal insufficiency    Myalgia    Hypothyroidism    Glaucoma    GERD, SEVERE 08/19/2010   Osteoarthritis 04/17/2008   FIBROMYALGIA 04/17/2008    Orientation RESPIRATION BLADDER Height & Weight     Self, Time, Situation, Place  Normal Continent Weight: 51.5 kg Height:     BEHAVIORAL SYMPTOMS/MOOD NEUROLOGICAL BOWEL NUTRITION STATUS      Continent Diet (Regular Diet)  AMBULATORY STATUS COMMUNICATION OF NEEDS Skin   Limited Assist Verbally Normal                       Personal Care Assistance Level of Assistance  Bathing, Feeding, Dressing Bathing Assistance: Limited assistance Feeding assistance: Independent Dressing Assistance: Limited assistance     Functional Limitations Info  Sight, Hearing, Speech Sight Info: Impaired Hearing Info: Adequate Speech Info: Adequate    SPECIAL CARE FACTORS FREQUENCY  PT (By licensed PT), OT (By licensed OT)     PT Frequency: 5xwk OT Frequency: 5xwk            Contractures Contractures Info: Not present    Additional Factors Info  Code Status, Allergies Code Status Info: FULL Allergies Info: Fenofibrate , Erythromycin, Guaifenesin  Er, Sulfonamide Derivatives           Current Medications (12/19/2023):  This is the current hospital active medication list Current Facility-Administered Medications  Medication Dose Route Frequency Provider Last Rate Last Admin   0.45 % sodium chloride  infusion   Intravenous Continuous Macdonald Savoy, MD 50 mL/hr at 12/19/23 [phone]  New Bag at 12/19/23 0906   acetaminophen  (TYLENOL ) tablet 650 mg  650 mg Oral Q6H PRN Macdonald Savoy, MD   650 mg at 12/18/23 1847   Or   acetaminophen  (TYLENOL ) suppository 650 mg  650 mg Rectal Q6H PRN Macdonald Savoy, MD       albuterol  (PROVENTIL ) (2.5 MG/3ML) 0.083% nebulizer solution 2.5 mg  2.5 mg Nebulization Q6H PRN Macdonald Savoy, MD       ALPRAZolam  (XANAX ) tablet 0.25 mg  0.25 mg Oral BID  PRN Macdonald Savoy, MD   0.25 mg at 12/18/23 2041   aspirin  tablet 325 mg  325 mg Oral Daily Macdonald Savoy, MD   325 mg at 12/19/23 1157   atorvastatin  (LIPITOR) tablet 20 mg  20 mg Oral Daily Macdonald Savoy, MD   20 mg at 12/19/23 0900   cyanocobalamin  (VITAMIN B12) injection 1,000 mcg  1,000 mcg Subcutaneous Q1200 Macdonald Savoy, MD   1,000 mcg at 12/19/23 0844   donepezil  (ARICEPT ) tablet 5 mg  5 mg Oral QHS Macdonald Savoy, MD   5 mg at 12/18/23 2040   enoxaparin  (LOVENOX ) injection 30 mg  30 mg Subcutaneous Q24H Macdonald Savoy, MD   30 mg at 12/18/23 2041   fluticasone  furoate-vilanterol (BREO ELLIPTA) 200-25 MCG/ACT 1 puff  1 puff Inhalation Daily Macdonald Savoy, MD   1 puff at 12/19/23 0851   hydrocortisone  (CORTEF ) tablet 10 mg  10 mg Oral BID Macdonald Savoy, MD       levothyroxine  (SYNTHROID ) tablet 25 mcg  25 mcg Oral Q0600 Macdonald Savoy, MD   25 mcg at 12/19/23 0516   loratadine (CLARITIN) tablet 10 mg  10 mg Oral Daily Macdonald Savoy, MD   10 mg at 12/19/23 0900   nitroGLYCERIN  (NITROSTAT ) SL tablet 0.4 mg  0.4 mg Sublingual Q5 min PRN Macdonald Savoy, MD       ondansetron  (ZOFRAN ) tablet 4 mg  4 mg Oral Q6H PRN Macdonald Savoy, MD       Or   ondansetron  (ZOFRAN ) injection 4 mg  4 mg Intravenous Q6H PRN Macdonald Savoy, MD       pantoprazole  (PROTONIX ) EC tablet 40 mg  40 mg Oral BID AC Macdonald Savoy, MD   40 mg at 12/19/23 0849   polyethylene glycol (MIRALAX  / GLYCOLAX ) packet 17 g  17 g Oral Daily PRN Macdonald Savoy, MD       potassium chloride  SA (KLOR-CON  M) CR tablet 40 mEq  40 mEq Oral BID Macdonald Savoy, MD   40 mEq at 12/19/23 0900     Discharge Medications: Please see discharge summary for a list of discharge medications.  Relevant Imaging Results:  Relevant Lab Results:   Additional Information [id number]  Tessie Fila, RN

## 2023-12-19 NOTE — Consult Note (Signed)
 Cardiology Consultation   Patient ID: Kaitlyn Adams MRN: [id number]; DOB: 1950-08-25  Admit date: 12/18/2023 Date of Consult: 12/19/2023  PCP:  Jess Morita, MD   [facility] Providers Cardiologist:  Peter Swaziland, MD  Cardiology APP:  [place], [place]    Patient Profile:   Kaitlyn Adams is a 73 y.o. female with a hx of PAT, PAF, chronic right bundle branch block, hypertension, hyperlipidemia, hypothyroidism s/p thyroidectomy, Addison's disease on Florinef  and hydrocortisone , asthma, GERD, fibromyalgia, nonobstructive CAD by left heart cath, history of syncope, orthostatic hypotension who is being seen 12/19/2023 for the evaluation of chest pain at the request of Dr. Versa Gore.  History of Present Illness:   [name] is a 73 year old female with above cardiac history who is followed by Dr. Swaziland.  Per chart review, patient previously admitted in 04/2015 after she presented complaining of chest discomfort, symptoms of recurrent atrial fibrillation.  She was found to have significant hypokalemia of 2.5.  Underwent left heart catheterization on 05/15/15 that showed mild nonobstructive CAD, normal LV systolic function, normal LVEDP.  She underwent echocardiogram 05/15/15 that showed EF 65-70%, no regional wall motion abnormalities, no significant valvular abnormalities.  She was started on Xarelto  and digoxin .  She wore a cardiac monitor following discharge that showed predominantly normal sinus rhythm with short bursts of SVT.  Patient later admitted in 10/2018 with lightheadedness.  She was orthostatic and appeared to be dehydrated, also had significant hypokalemia.  Aldactone  was discontinued, metoprolol  reduced.  Cortisol levels low and she was later started on Florinef .  Following a syncopal episode in 05/2020, patient underwent echocardiogram on 05/26/2020 that showed EF 65-70%, no regional wall motion abnormalities, grade 1 DD, normal RV systolic  function, normal PA systolic pressure, mild MR. Cardiac monitor in 06/2020 showed predominantly normal sinus rhythm, rare P ACs, 111 beat run of PAT.  Patient was seen in the ER in 05/2022 after an episode of near syncope, lightheadedness.  She reported that she had had multiple episodes over the past couple of days, she had not been taking her potassium supplementation.  She was hypokalemic at 2.1 with evidence of AKI with creatinine 2.21.  She was given IV fluids and potassium supplementation.  For further workup, she underwent echocardiogram on 07/18/2022 that showed EF 60-65%, no regional wall motion abnormalities, normal RV systolic function, mild mitral valve regurgitation.  She wore a cardiac monitor in 07/2022 that showed normal sinus rhythm, rare PACs and PVCs, occasional short runs of SVT, no atrial fibrillation.  Her most recent echocardiogram from 07/07/2023 showed EF 60-65%, no regional wall motion abnormalities, grade 1 DD, normal RV systolic function, normal PA systolic pressure, trivial MR.  Of note, patient has not been on anticoagulation as she has only had 1 documented episode of atrial fibrillation and has a history of major GI bleed and recurrent falls.  She was unable to tolerate metoprolol  due to hypotension.  Patient presented to the ED on 4/28 complaining of weakness, pain all over.  Reported she had missed some doses of her steroid.  Labs in the ED significant for K<2, creatinine 1.64, albumin 3.3, mag 2.1, WBC 21, hemoglobin 13.5, platelets 482.  Serum digoxin  1.1.  High-sensitivity troponin 214, 184. EKG in the ED showed sinus rhythm with PVCs, right bundle branch block, heart rate 66 bpm.  Patient was admitted to the internal medicine service for treatment of severe hypokalemia, nausea vomiting possibly due to adrenal insufficiency and/or viral infection, AKI  on CKD.  On 4/29, a rapid response was called on patient due to complaint of chest pain.  Nitroglycerin  was ordered but  was not given due to BP 89/54.  Heart rate was controlled in the 70s during the event.  500 cc normal saline was ordered.  EKG showed normal sinus rhythm, heart rate 78 bpm, RBBB.  No significant changes when compared to previous EKGs in the system.  Cardiology consulted   Past Medical History:  Diagnosis Date   Addison disease (HCC)    Anemia    Asthma    - normal spirometry 2009 FEV1  >90% predicted.  - methacoline challenge neg 11/09   Bronchitis    recurrent   Cataract    Chronic cough    - sinus CT 03-23-10 > neg.  - allergy  profile 03-23-10 > nl, IgE 14.  - flutter valve rx 03-23-10   Fibromyalgia    GERD (gastroesophageal reflux disease)    exacerbating VCD   Glaucoma    History of blood transfusion    Hyperlipidemia    Hypertension    Hyperthyroidism    with hot nodule.  - total thyroidectomy 11-05-08 benign -> subsequent hypothyroidism.   Hypokalemia    Hypothyroidism    Hypothyroidism    a. following thyroidectomy.   Mild CAD    a. LHC 04/2015 - mild nonobstructive CAD with 30% dLAD, 40% D1, 25% pLCx, 30% mRCA, 30% RPDA, normal EF >65% with normal LVEDP.   Orthostatic hypotension    Paroxysmal atrial fibrillation (HCC)    Renal insufficiency    Vasomotor rhinitis    exacerbates VCD   Vocal cord dysfunction    proven on FOB 9/09    Past Surgical History:  Procedure Laterality Date   ABDOMINAL HYSTERECTOMY  1980   ABDOMINAL HYSTERECTOMY     BASAL CELL CARCINOMA EXCISION  11/08   BIOPSY  12/18/2022   Procedure: BIOPSY;  Surgeon: Kenney Peacemaker, MD;  Location: [facility];  Service: Gastroenterology;;   CARDIAC CATHETERIZATION N/A 05/15/2015   Procedure: Left Heart Cath and Coronary Angiography;  Surgeon: Arnoldo Lapping, MD;  Location: [facility] INVASIVE CV LAB;  Service: Cardiovascular;  Laterality: N/A;   COLONOSCOPY N/A 04/27/2017   Dr Leonia Raman for scant rectal bleeding and iron def anemia: Melanosis coli, lipomatous IC valve, left sided tics, non-bleeding hemorrhoid, 10 mm  transverse polyp.     COLONOSCOPY WITH PROPOFOL  N/A 12/18/2022   Procedure: COLONOSCOPY WITH PROPOFOL ;  Surgeon: Kenney Peacemaker, MD;  Location: [facility] ENDOS[facility];  Service: Gastroenterology;  Laterality: N/A;   ESOPHAGOGASTRODUODENOSCOPY N/A 04/27/2017   Sergio Dandy, MD; [facility] END[facility]COPY. for iron def anemia.  Monilial/candidial esophagitis. Small HH.     ESOPHAGOGASTRODUODENOSCOPY (EGD) WITH PROPOFOL  N/A 12/18/2022   Procedure: ESOPHAGOGASTRODUODENOSCOPY (EGD) WITH PROPOFOL ;  Surgeon: Kenney Peacemaker, MD;  Location: [facility] END[facility];  Service: Gastroenterology;  Laterality: N/A;   EYE SURGERY     POLYPECTOMY  12/18/2022   Procedure: POLYPECTOMY;  Surgeon: Kenney Peacemaker, MD;  Location: [facility] ENDOSCOPY;  Service: Gastroenterology;;   scar tissue removal  1982   THYROID  SURGERY  2010   THYROIDECTOMY  11-05-08     Home Medications:  Prior to Admission medications   Medication Sig Start Date End Date Taking? Authorizing Provider  albuterol  (VENTOLIN  HFA) 108 (90 Base) MCG/ACT inhaler INHALE 2 PUFFS INTO THE LUNGS EVERY 6 HOURS AS NEEDED FOR WHEEZING OR SHORTNESS OF BREATH 02/02/23  Yes Jess Morita, MD  amitriptyline  (ELAVIL ) 50 MG tablet TAKE  1 TABLET BY MOUTH AT BEDTIME 11/24/23  Yes Tabori, Katherine E, MD  atorvastatin  (LIPITOR) 20 MG tablet TAKE 1 TABLET(20 MG) BY MOUTH DAILY 11/28/23  Yes Tabori, Katherine E, MD  cholecalciferol (VITAMIN D3) 25 MCG (1000 UNIT) tablet Take 1,000 Units by mouth daily.   Yes [provider]  digoxin  (LANOXIN ) 0.125 MG tablet TAKE 1 TABLET(0.125 MG) BY MOUTH DAILY 10/27/23  Yes Swaziland, Peter M, MD  diphenhydramine -acetaminophen  (TYLENOL  PM) 25-500 MG TABS tablet Take 1 tablet by mouth at bedtime as needed (pain).   Yes [provider]  donepezil  (ARICEPT ) 5 MG tablet TAKE 1 TABLET(5 MG) BY MOUTH AT BEDTIME 11/06/23  Yes Tabori, Katherine E, MD  Ferrous Sulfate  (IRON PO) Take 1 tablet by mouth daily.   Yes [provider]  fluticasone -salmeterol  (ADVAIR DISKUS) 250-50 MCG/ACT AEPB Inhale 1 puff into the lungs in the morning and at bedtime. 11/23/23  Yes Aleck Hurdle, MD  furosemide  (LASIX ) 20 MG tablet TAKE 1 TABLET(20 MG) BY MOUTH DAILY AS NEEDED FOR SWELLING Patient taking differently: Take 20 mg by mouth daily. 11/23/23  Yes Swaziland, Peter M, MD  hydrocortisone  (CORTEF ) 10 MG tablet Take 10 mg by mouth 2 (two) times daily. Patient reports taking BID 09/07/2022 09/30/20  Yes [provider]  hydroxypropyl methylcellulose / hypromellose (ISOPTO TEARS / GONIOVISC) 2.5 % ophthalmic solution Place 2 drops into both eyes daily as needed for dry eyes.   Yes [provider]  levocetirizine (XYZAL ) 5 MG tablet Take 1 tablet (5 mg total) by mouth every evening. Patient taking differently: Take 5 mg by mouth daily as needed for allergies. 06/28/23  Yes Aleck Hurdle, MD  levothyroxine  (SYNTHROID ) 25 MCG tablet Take 25 mcg by mouth every morning. 08/16/22  Yes [provider]  potassium chloride  SA (KLOR-CON  M) 20 MEQ tablet Take 2 tablets (40 mEq total) by mouth daily. Patient taking differently: Take 60 mEq by mouth daily. 12/06/22  Yes Swaziland, Peter M, MD    Inpatient Medications: Scheduled Meds:  aspirin   325 mg Oral Daily   atorvastatin   20 mg Oral Daily   cyanocobalamin   1,000 mcg Subcutaneous Q1200   digoxin   0.125 mg Oral Daily   donepezil   5 mg Oral QHS   enoxaparin  (LOVENOX ) injection  30 mg Subcutaneous Q24H   fluticasone  furoate-vilanterol  1 puff Inhalation Daily   hydrocortisone   10 mg Oral BID   levothyroxine   25 mcg Oral Q0600   loratadine  10 mg Oral Daily   pantoprazole   40 mg Oral BID AC   potassium chloride   40 mEq Oral BID   Continuous Infusions:  sodium chloride  50 mL/hr at 12/19/23 0906   PRN Meds: acetaminophen  **OR** acetaminophen , albuterol , ALPRAZolam , nitroGLYCERIN , ondansetron  **OR** ondansetron  (ZOFRAN ) IV, polyethylene glycol  Allergies:    Allergies  Allergen Reactions    Fenofibrate  Other (See Comments)    GI side effects    Erythromycin Nausea And Vomiting    REACTION: nausea and vomiting   Guaifenesin  Er Palpitations   Sulfonamide Derivatives Nausea And Vomiting    REACTION: nausea and vomiting    Social History:   Social History   Socioeconomic History   Marital status: Married    Spouse name: Not on file   Number of children: 1   Years of education: Not on file   Highest education level: 12th grade  Occupational History   Occupation: owns a Museum/gallery curator   Occupation: OWNER    Employer: [employer]  Tobacco Use   Smoking status: Never   Smokeless tobacco: Never  Vaping Use   Vaping status: Never Used  Substance and Sexual Activity   Alcohol  use: Yes    Alcohol /week: 1.0 standard drink of alcohol     Types: 1 Glasses of wine per week    Comment: occasional wine or beer   Drug use: No   Sexual activity: Yes    Birth control/protection: None  Other Topics Concern   Not on file  Social History Narrative   Married, 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   Occasional wine or beer no alcohol  tobacco or drug use otherwise   Social Drivers of Corporate investment banker Strain: Low Risk  (09/20/2023)   Overall Financial Resource Strain (CARDIA)    Difficulty of Paying Living Expenses: Not hard at all  Food Insecurity: No Food Insecurity (12/18/2023)   Hunger Vital Sign    Worried About Running Out of Food in the Last Year: Never true    Ran Out of Food in the Last Year: Never true  Transportation Needs: No Transportation Needs (12/18/2023)   PRAPARE - Administrator, Civil Service (Medical): No    Lack of Transportation (Non-Medical): No  Physical Activity: Insufficiently Active (09/20/2023)   Exercise Vital Sign    Days of Exercise per Week: 1 day    Minutes of Exercise per Session: 10 min  Stress: No Stress Concern Present (09/20/2023)   [employer] of Occupational Health -  Occupational Stress Questionnaire    Feeling of Stress : Not at all  Social Connections: Moderately Isolated (12/18/2023)   Social Connection and Isolation Panel [NHANES]    Frequency of Communication with Friends and Family: Three times a week    Frequency of Social Gatherings with Friends and Family: Once a week    Attends Religious Services: Never    Database administrator or Organizations: No    Attends Banker Meetings: Never    Marital Status: Married  Catering manager Violence: Not At Risk (12/18/2023)   Humiliation, Afraid, Rape, and Kick questionnaire    Fear of Current or Ex-Partner: No    Emotionally Abused: No    Physically Abused: No    Sexually Abused: No    Family History:    Family History  Problem Relation Age of Onset   Asthma Mother    Heart disease Mother    Emphysema Father    Heart disease Father    Irregular heart beat Brother    Asthma Grandchild      ROS:  Please see the history of present illness.   All other ROS reviewed and negative.     Physical Exam/Data:   Vitals:   12/19/23 0138 12/19/23 0434 12/19/23 0500 12/19/23 0900  BP: 121/67 122/76    Pulse: 66 64  70  Resp: 18 16    Temp: 97.9 F (36.6 C) 97.9 F (36.6 C)    TempSrc:      SpO2: 100% 100%    Weight:   51.5 kg     Intake/Output Summary (Last 24 hours) at 12/19/2023 1209 Last data filed at 12/18/2023 1615 Gross per 24 hour  Intake 700 ml  Output --  Net 700 ml      12/19/2023    5:00 AM 10/20/2023    8:04 AM 10/19/2023    1:41 PM  Last 3 Weights  Weight (lbs) 113 lb 8 oz 96 lb  97 lb 4.8 oz  Weight (kg) 51.483 kg 43.545 kg 44.135 kg     Body mass index is 20.11 kg/m.  General:  Thin elderly female, sitting upright in the bed in no acute distress  HEENT: normal Neck: no JVD Vascular: Radial pulses 2+ bilaterally Cardiac:  normal S1, S2; RRR; faint systolic murmur. Chest wall tender to palpation  Lungs:  clear to auscultation bilaterally, no wheezing,  rhonchi or rales. Normal WOB on room air   Abd: soft, nontender  Ext: no edema in BLE  Musculoskeletal:  No deformities  Skin: warm and dry  Neuro:  CNs 2-12 intact, no focal abnormalities noted Psych:  Normal affect    Relevant CV Studies: Cardiac Studies & Procedures   ______________________________________________________________________________________________ CARDIAC CATHETERIZATION  CARDIAC CATHETERIZATION 05/15/2015  Conclusion 1. Mild nonobstructive CAD as outlined above 2. Normal LV systolic function with normal LVEDP  Recommendations:  Stop ASA  Start Xarelto  20 mg daily tomorrow (CHADS-Vasc = 3 for age/HTN/female gender)  Outpatient event monitor to evaluate for PAF as cause of symptoms  Findings Coronary Findings Diagnostic  Dominance: Right  Left Anterior Descending  First Diagonal Branch  Left Circumflex  Right Coronary Artery  Right Posterior Descending Artery  Intervention  No interventions have been documented.     ECHOCARDIOGRAM  ECHOCARDIOGRAM COMPLETE 07/07/2023  Narrative ECHOCARDIOGRAM REPORT    Patient Name:   JERSIE TOSTO Date of Exam: 07/07/2023 Medical Rec #:  [id number]          Height:       63.0 in Accession #:    [id number]         Weight:       101.2 lb Date of Birth:  15-Sep-1950          BSA:          1.448 m Patient Age:    72 years           BP:           110/72 mmHg Patient Gender: F                  HR:           84 bpm. Exam Location:  [address]  Procedure: 2D Echo, 3D Echo, Cardiac Doppler and Color Doppler  Indications:    I34.0 Mitral Valve Insufficiency  History:        Patient has prior history of Echocardiogram examinations, most recent 07/18/2022. CAD, Arrythmias:Atrial Fibrillation, Signs/Symptoms:Edema; Risk Factors:Family History of Coronary Artery Disease, Hypertension and Dyslipidemia. Asthma, Addisons Disease.  Sonographer:    Ewing Holiday RDCS Referring Phys: ANGELA NICOLE  [facility]  IMPRESSIONS   1. Left ventricular ejection fraction, by estimation, is 60 to 65%. The left ventricle has normal function. The left ventricle has no regional wall motion abnormalities. Left ventricular diastolic parameters are consistent with Grade I diastolic dysfunction (impaired relaxation). 2. Right ventricular systolic function is normal. The right ventricular size is normal. There is normal pulmonary artery systolic pressure. The estimated right ventricular systolic pressure is 24.0 mmHg. 3. The mitral valve is normal in structure. Trivial mitral valve regurgitation. No evidence of mitral stenosis. 4. The aortic valve is tricuspid. Aortic valve regurgitation is not visualized. No aortic stenosis is present. 5. The inferior vena cava is normal in size with greater than 50% respiratory variability, suggesting right atrial pressure of 3 mmHg.  FINDINGS Left Ventricle: Left ventricular ejection fraction, by estimation, is 60 to 65%. The left ventricle  has normal function. The left ventricle has no regional wall motion abnormalities. The left ventricular internal cavity size was normal in size. There is no left ventricular hypertrophy. Left ventricular diastolic parameters are consistent with Grade I diastolic dysfunction (impaired relaxation).  Right Ventricle: The right ventricular size is normal. No increase in right ventricular wall thickness. Right ventricular systolic function is normal. There is normal pulmonary artery systolic pressure. The tricuspid regurgitant velocity is 2.29 m/s, and with an assumed right atrial pressure of 3 mmHg, the estimated right ventricular systolic pressure is 24.0 mmHg.  Left Atrium: Left atrial size was normal in size.  Right Atrium: Right atrial size was normal in size.  Pericardium: There is no evidence of pericardial effusion.  Mitral Valve: The mitral valve is normal in structure. Trivial mitral valve regurgitation. No evidence of mitral valve  stenosis.  Tricuspid Valve: The tricuspid valve is normal in structure. Tricuspid valve regurgitation is trivial.  Aortic Valve: The aortic valve is tricuspid. Aortic valve regurgitation is not visualized. No aortic stenosis is present.  Pulmonic Valve: The pulmonic valve was normal in structure. Pulmonic valve regurgitation is not visualized.  Aorta: The aortic root is normal in size and structure.  Venous: The inferior vena cava is normal in size with greater than 50% respiratory variability, suggesting right atrial pressure of 3 mmHg.  IAS/Shunts: No atrial level shunt detected by color flow Doppler.   LEFT VENTRICLE PLAX 2D LVIDd:         3.70 cm   Diastology LVIDs:         1.70 cm   LV e' medial:    8.16 cm/s LV PW:         0.60 cm   LV E/e' medial:  9.3 LV IVS:        0.70 cm   LV e' lateral:   11.30 cm/s LVOT diam:     2.10 cm   LV E/e' lateral: 6.7 LV SV:         74 LV SV Index:   51 LVOT Area:     3.46 cm  3D Volume EF: 3D EF:        73 % LV EDV:       90 ml LV ESV:       24 ml LV SV:        66 ml  RIGHT VENTRICLE RV Basal diam:  3.00 cm RV S prime:     13.05 cm/s TAPSE (M-mode): 1.5 cm RVSP:           24.0 mmHg  LEFT ATRIUM           Index        RIGHT ATRIUM           Index LA diam:      2.90 cm 2.00 cm/m   RA Pressure: 3.00 mmHg LA Vol (A2C): 31.5 ml 21.76 ml/m  RA Area:     10.40 cm LA Vol (A4C): 19.8 ml 13.68 ml/m  RA Volume:   21.90 ml  15.13 ml/m AORTIC VALVE LVOT Vmax:   131.00 cm/s LVOT Vmean:  84.750 cm/s LVOT VTI:    0.213 m  AORTA Ao Root diam: 2.50 cm Ao Asc diam:  3.20 cm  MITRAL VALVE                TRICUSPID VALVE MV Area (PHT): cm          TR Peak grad:   21.0 mmHg MV Decel  Time: 197 msec     TR Vmax:        229.00 cm/s MV E velocity: 76.20 cm/s   Estimated RAP:  3.00 mmHg MV A velocity: 100.00 cm/s  RVSP:           24.0 mmHg MV E/A ratio:  0.76 SHUNTS Systemic VTI:  0.21 m Systemic Diam: 2.10 cm  Dalton  McleanMD Electronically signed by Archer Bear Signature Date/Time: 07/07/2023/8:42:06 AM    Final    MONITORS  LONG TERM MONITOR-LIVE TELEMETRY (3-14 DAYS) 07/22/2022  Narrative   Normal sinus rhythm   Rare PACs and PVCs   Occastional short runs of SVT generally lasting less than 6 beats. longest 19 beats. no sustained episodes   No Afib.   Patch Wear Time:  13 days and 22 hours (2023-11-07T10:58:12-498 to 2023-11-21T09:36:48-0500)  Patient had a min HR of 66 bpm, max HR of 200 bpm, and avg HR of 81 bpm. Predominant underlying rhythm was Sinus Rhythm. Bundle Branch Block/IVCD was present. 63 Supraventricular Tachycardia runs occurred, the run with the fastest interval lasting 5 beats with a max rate of 200 bpm, the longest lasting 19 beats with an avg rate of 132 bpm. Isolated SVEs were rare (<1.0%), SVE Couplets were rare (<1.0%), and SVE Triplets were rare (<1.0%). Isolated VEs were rare (<1.0%), VE Couplets were rare (<1.0%), and no VE Triplets were present.       ______________________________________________________________________________________________       Laboratory Data:  High Sensitivity Troponin:   Recent Labs  Lab 12/18/23 1211 12/18/23 1515  TROPONINIHS 214* 184*     Chemistry Recent Labs  Lab 12/18/23 1211 12/18/23 1849 12/19/23 0521  NA 137  --  139  K <2.0*  --  2.6*  CL 113*  --  118*  CO2 15*  --  13*  GLUCOSE 100*  --  112*  BUN 27*  --  27*  CREATININE 1.64* 1.44* 1.51*  CALCIUM  8.8*  --  7.7*  MG 2.1  --   --   GFRNONAA 33* 38* 36*  ANIONGAP 9  --  8    Recent Labs  Lab 12/18/23 1211  PROT 7.5  ALBUMIN 3.3*  AST 175*  ALT 67*  ALKPHOS 59  BILITOT 0.5   Lipids No results for input(s): "CHOL", "TRIG", "HDL", "LABVLDL", "LDLCALC", "CHOLHDL" in the last 168 hours.  Hematology Recent Labs  Lab 12/18/23 1211 12/18/23 1849 12/19/23 0521  WBC 21.0* 21.1* 26.3*  RBC 4.77 4.38 3.87  3.93  HGB 13.5 12.1 11.0*  HCT 39.4  36.4 33.4*  MCV 82.6 83.1 86.3  MCH 28.3 27.6 28.4  MCHC 34.3 33.2 32.9  RDW 15.9* 15.9* 16.5*  PLT 482* 430* 404*   Thyroid   Recent Labs  Lab 12/18/23 1211  TSH 5.760*    BNPNo results for input(s): "BNP", "PROBNP" in the last 168 hours.  DDimer No results for input(s): "DDIMER" in the last 168 hours.   Radiology/Studies:  CT Cervical Spine Wo Contrast Result Date: 12/18/2023 CLINICAL DATA:  Weakness, pain, cervical radiculopathy EXAM: CT CERVICAL SPINE WITHOUT CONTRAST TECHNIQUE: Multidetector CT imaging of the cervical spine was performed without intravenous contrast. Multiplanar CT image reconstructions were also generated. RADIATION DOSE REDUCTION: This exam was performed according to the departmental dose-optimization program which includes automated exposure control, adjustment of the mA and/or kV according to patient size and/or use of iterative reconstruction technique. COMPARISON:  06/19/2022 FINDINGS: Alignment: Alignment is grossly anatomic. Skull base and vertebrae: No acute  fracture. No primary bone lesion or focal pathologic process. Soft tissues and spinal canal: No prevertebral fluid or swelling. No visible canal hematoma. Disc levels: Stable hypertrophic changes at the C1-C2 interface. There is multilevel spondylosis most pronounced at C4-5, C5-6, and C6-7. Multilevel facet hypertrophy greatest at the C2-3, C3-4, and C4-5 levels on the right. No significant change since prior exam. Upper chest: Airway is patent. Visualized portions of the lung apices are clear. Other: Reconstructed images demonstrate no additional findings. IMPRESSION: 1. No acute cervical spine fracture. 2. Stable multilevel spondylosis and facet hypertrophy as above. Electronically Signed   By: Bobbye Burrow M.D.   On: 12/18/2023 16:49   CT Head Wo Contrast Result Date: 12/18/2023 CLINICAL DATA:  Weakness, orthostatic hypotension, vomiting EXAM: CT HEAD WITHOUT CONTRAST TECHNIQUE: Contiguous axial images  were obtained from the base of the skull through the vertex without intravenous contrast. RADIATION DOSE REDUCTION: This exam was performed according to the departmental dose-optimization program which includes automated exposure control, adjustment of the mA and/or kV according to patient size and/or use of iterative reconstruction technique. COMPARISON:  06/19/2022 FINDINGS: Brain: No acute infarct or hemorrhage. Lateral ventricles and midline structures are unremarkable. No acute extra-axial fluid collections. No mass effect. Vascular: No hyperdense vessel or unexpected calcification. Skull: Normal. Negative for fracture or focal lesion. Sinuses/Orbits: No acute finding. Other: None. IMPRESSION: 1. No acute intracranial process. Electronically Signed   By: Bobbye Burrow M.D.   On: 12/18/2023 16:46   CT CHEST ABDOMEN PELVIS WO CONTRAST Result Date: 12/18/2023 CLINICAL DATA:  Weakness, pain, unintended weight loss * Tracking Code: BO * EXAM: CT CHEST, ABDOMEN AND PELVIS WITHOUT CONTRAST CT THORACIC AND LUMBAR SPINE WITHOUT CONTRAST TECHNIQUE: Multidetector CT imaging of the chest, abdomen and pelvis was performed following the standard protocol without IV contrast. Multidetector CT imaging of the thoracic and lumbar spine was performed following the standard protocol without IV contrast. RADIATION DOSE REDUCTION: This exam was performed according to the departmental dose-optimization program which includes automated exposure control, adjustment of the mA and/or kV according to patient size and/or use of iterative reconstruction technique. COMPARISON:  Chest radiographs, 04/19/2023 FINDINGS: CT CHEST FINDINGS Cardiovascular: Aortic atherosclerosis. Normal heart size. Left and right coronary artery calcifications. No pericardial effusion. Mediastinum/Nodes: No enlarged mediastinal, hilar, or axillary lymph nodes. Surgical clips about the thyroid . Trachea, and esophagus demonstrate no significant findings.  Lungs/Pleura: Lungs are clear. No pleural effusion or pneumothorax. Musculoskeletal: No chest wall mass or suspicious osseous lesions identified. CT ABDOMEN PELVIS FINDINGS Hepatobiliary: No solid liver abnormality is seen. No gallstones, gallbladder wall thickening, or biliary dilatation. Pancreas: Unremarkable. No pancreatic ductal dilatation or surrounding inflammatory changes. Spleen: Normal in size without significant abnormality. Adrenals/Urinary Tract: Adrenal glands are unremarkable. Kidneys are normal, without renal calculi, solid lesion, or hydronephrosis. Bladder is unremarkable. Stomach/Bowel: Stomach is within normal limits. Appendix appears normal. No evidence of bowel wall thickening, distention, or inflammatory changes. Vascular/Lymphatic: Aortic atherosclerosis. No enlarged abdominal or pelvic lymph nodes. Reproductive: Status post hysterectomy. Other: No abdominal wall hernia or abnormality. No ascites. Musculoskeletal: No acute osseous findings. CT THORACIC AND LUMBAR SPINE FINDINGS Alignment: Normal thoracic kyphosis. Normal lumbar lordosis. Vertebral bodies: No acute fracture. Superior endplate wedge deformity of L1 unchanged as compared to prior radiographs dated 04/19/2023. Disc spaces: Generally mild disc space height loss throughout the thoracic and lumbar spine, focally moderate at L5-S1. Mild anterior bridging osteophytosis throughout the thoracic spine in keeping with DISH. Paraspinous soft tissues: Unremarkable. IMPRESSION: 1. No noncontrast  CT evidence of mass or lymphadenopathy in the chest, abdomen, or pelvis. 2. No acute fracture or dislocation of the thoracic or lumbar spine. Superior endplate wedge deformity of L1 unchanged as compared to prior radiographs dated 04/19/2023. 3. Coronary artery disease. Aortic Atherosclerosis (ICD10-I70.0). Electronically Signed   By: Fredricka Jenny M.D.   On: 12/18/2023 16:29   CT T-SPINE NO CHARGE Result Date: 12/18/2023 CLINICAL DATA:  Weakness,  pain, unintended weight loss * Tracking Code: BO * EXAM: CT CHEST, ABDOMEN AND PELVIS WITHOUT CONTRAST CT THORACIC AND LUMBAR SPINE WITHOUT CONTRAST TECHNIQUE: Multidetector CT imaging of the chest, abdomen and pelvis was performed following the standard protocol without IV contrast. Multidetector CT imaging of the thoracic and lumbar spine was performed following the standard protocol without IV contrast. RADIATION DOSE REDUCTION: This exam was performed according to the departmental dose-optimization program which includes automated exposure control, adjustment of the mA and/or kV according to patient size and/or use of iterative reconstruction technique. COMPARISON:  Chest radiographs, 04/19/2023 FINDINGS: CT CHEST FINDINGS Cardiovascular: Aortic atherosclerosis. Normal heart size. Left and right coronary artery calcifications. No pericardial effusion. Mediastinum/Nodes: No enlarged mediastinal, hilar, or axillary lymph nodes. Surgical clips about the thyroid . Trachea, and esophagus demonstrate no significant findings. Lungs/Pleura: Lungs are clear. No pleural effusion or pneumothorax. Musculoskeletal: No chest wall mass or suspicious osseous lesions identified. CT ABDOMEN PELVIS FINDINGS Hepatobiliary: No solid liver abnormality is seen. No gallstones, gallbladder wall thickening, or biliary dilatation. Pancreas: Unremarkable. No pancreatic ductal dilatation or surrounding inflammatory changes. Spleen: Normal in size without significant abnormality. Adrenals/Urinary Tract: Adrenal glands are unremarkable. Kidneys are normal, without renal calculi, solid lesion, or hydronephrosis. Bladder is unremarkable. Stomach/Bowel: Stomach is within normal limits. Appendix appears normal. No evidence of bowel wall thickening, distention, or inflammatory changes. Vascular/Lymphatic: Aortic atherosclerosis. No enlarged abdominal or pelvic lymph nodes. Reproductive: Status post hysterectomy. Other: No abdominal wall hernia or  abnormality. No ascites. Musculoskeletal: No acute osseous findings. CT THORACIC AND LUMBAR SPINE FINDINGS Alignment: Normal thoracic kyphosis. Normal lumbar lordosis. Vertebral bodies: No acute fracture. Superior endplate wedge deformity of L1 unchanged as compared to prior radiographs dated 04/19/2023. Disc spaces: Generally mild disc space height loss throughout the thoracic and lumbar spine, focally moderate at L5-S1. Mild anterior bridging osteophytosis throughout the thoracic spine in keeping with DISH. Paraspinous soft tissues: Unremarkable. IMPRESSION: 1. No noncontrast CT evidence of mass or lymphadenopathy in the chest, abdomen, or pelvis. 2. No acute fracture or dislocation of the thoracic or lumbar spine. Superior endplate wedge deformity of L1 unchanged as compared to prior radiographs dated 04/19/2023. 3. Coronary artery disease. Aortic Atherosclerosis (ICD10-I70.0). Electronically Signed   By: Fredricka Jenny M.D.   On: 12/18/2023 16:29   CT L-SPINE NO CHARGE Result Date: 12/18/2023 CLINICAL DATA:  Weakness, pain, unintended weight loss * Tracking Code: BO * EXAM: CT CHEST, ABDOMEN AND PELVIS WITHOUT CONTRAST CT THORACIC AND LUMBAR SPINE WITHOUT CONTRAST TECHNIQUE: Multidetector CT imaging of the chest, abdomen and pelvis was performed following the standard protocol without IV contrast. Multidetector CT imaging of the thoracic and lumbar spine was performed following the standard protocol without IV contrast. RADIATION DOSE REDUCTION: This exam was performed according to the departmental dose-optimization program which includes automated exposure control, adjustment of the mA and/or kV according to patient size and/or use of iterative reconstruction technique. COMPARISON:  Chest radiographs, 04/19/2023 FINDINGS: CT CHEST FINDINGS Cardiovascular: Aortic atherosclerosis. Normal heart size. Left and right coronary artery calcifications. No pericardial effusion. Mediastinum/Nodes: No enlarged  mediastinal, hilar, or axillary lymph nodes. Surgical clips about the thyroid . Trachea, and esophagus demonstrate no significant findings. Lungs/Pleura: Lungs are clear. No pleural effusion or pneumothorax. Musculoskeletal: No chest wall mass or suspicious osseous lesions identified. CT ABDOMEN PELVIS FINDINGS Hepatobiliary: No solid liver abnormality is seen. No gallstones, gallbladder wall thickening, or biliary dilatation. Pancreas: Unremarkable. No pancreatic ductal dilatation or surrounding inflammatory changes. Spleen: Normal in size without significant abnormality. Adrenals/Urinary Tract: Adrenal glands are unremarkable. Kidneys are normal, without renal calculi, solid lesion, or hydronephrosis. Bladder is unremarkable. Stomach/Bowel: Stomach is within normal limits. Appendix appears normal. No evidence of bowel wall thickening, distention, or inflammatory changes. Vascular/Lymphatic: Aortic atherosclerosis. No enlarged abdominal or pelvic lymph nodes. Reproductive: Status post hysterectomy. Other: No abdominal wall hernia or abnormality. No ascites. Musculoskeletal: No acute osseous findings. CT THORACIC AND LUMBAR SPINE FINDINGS Alignment: Normal thoracic kyphosis. Normal lumbar lordosis. Vertebral bodies: No acute fracture. Superior endplate wedge deformity of L1 unchanged as compared to prior radiographs dated 04/19/2023. Disc spaces: Generally mild disc space height loss throughout the thoracic and lumbar spine, focally moderate at L5-S1. Mild anterior bridging osteophytosis throughout the thoracic spine in keeping with DISH. Paraspinous soft tissues: Unremarkable. IMPRESSION: 1. No noncontrast CT evidence of mass or lymphadenopathy in the chest, abdomen, or pelvis. 2. No acute fracture or dislocation of the thoracic or lumbar spine. Superior endplate wedge deformity of L1 unchanged as compared to prior radiographs dated 04/19/2023. 3. Coronary artery disease. Aortic Atherosclerosis (ICD10-I70.0).  Electronically Signed   By: Fredricka Jenny M.D.   On: 12/18/2023 16:29   DG Chest Port 1 View Result Date: 12/18/2023 CLINICAL DATA:  Questionable sepsis - evaluate for abnormality EXAM: PORTABLE CHEST - 1 VIEW COMPARISON:  None available. FINDINGS: No focal airspace consolidation, pleural effusion, or pneumothorax. No cardiomegaly. Aortic atherosclerosis. No acute fracture or destructive lesion. Multilevel thoracic osteophytosis. IMPRESSION: No acute cardiopulmonary abnormality. Electronically Signed   By: Rance Burrows M.D.   On: 12/18/2023 13:36     Assessment and Plan:   Chest Pain  - Patient reports that her PCP recently diagnosed her with costochondritis. She has occasional episodes of chest pain near her sternum and in her left arm pit. These episodes of chest pain are reproducible on palpation  - This AM, patient reported having an episode of chest pain similar to her previous episodes of costochondritis. Pain is reproducible on palpation on exam  - hsTn was 214>184 yesterday in the ED. Down to 138 today  - EKG unchanged compared to previous EKGs- she has a chronic RBBB, no acute changes noted - Overall, chest pain is consistent with costochondritis. Has PRN  tylenol  ordered. Would advise against PRN nitroglycerin  as she has had episodes of low BP this admission   PAF  RBBB PAT  Bradycardia, sinus pause  - Per chart review, patient has only had one documented episode of PAF in 2016. Multiple monitors since then have showed PAT, but no atrial fibrillation. She has not been on anticoagulation with no afib recurrence and her history of GI bleeds and falls  - Patient maintaining NSR per telemetry. After receiving digoxin  this AM, she had a few sinus pauses - With her lack of afib and recurrent episodes of hypokalemia, I do not think that digoxin  is an ideal medication for her to be on. Stop digoxin    Severe Hypokalemia  - K was less than 2 on admission, improved slightly to 2.6 today   - Patient currently has 80 mEq K ordered  -  Ordered an additional 60 mEq to be given today. Would ideally get her K around 4 given her recurrent hypokalemia. Will likely need to be on higher dose of K supplementation at DC  - Note- severe hypokalemia may be contributing to abnormal EKG   Elevated Troponin  - hsTn [phone]  - As above, patient has atypical chest pain consistent with costochondritis  - Trend not consistent with ACS - Ordered limited echo to assess EF   Orthostatic hypotension  - Appears somewhat chronic, likely related to her Adrenal insufficiency   Otherwise per primary  - Adrenal insufficiency  - Leukocytosis  - Nausea, vomiting  - Hypothyroidism   Risk Assessment/Risk Scores:   For questions or updates, please contact [facility] Please consult [email or web address] for contact info under    Signed, Debria Fang, PA-C  12/19/2023 12:09 PM

## 2023-12-19 NOTE — Plan of Care (Signed)

## 2023-12-19 NOTE — Significant Event (Signed)
 Rapid Response Event Note   Reason for Call :  Chest pain   Initial Focused Assessment:  Patient appears comfortable eating breakfast in bed. Patient's  chest pain subsided on its own. Patient stated that the pain comes and goes and is isolated in her left upper quadrant. MD Versa Gore notified, orders placed for nitroglycerin  and troponin lab draw. Nitroglycerin  on hold due to chest pain subsiding on its own and patients BP being 89/54. Lung sounds clear, HR controlled in the 70's murmur noted. After Digoxin  given at 0900 patient did experience some sinus pauses/ bradycardia but current rhythm is NSR in the 70's. MD aware.  Interventions:  500 Nacl bolus over 2 hours  Nitroglycerin  PRN for sustained chest pain Troponin labs  Plan of Care:  Recheck vitals after bolus complete, if patient BP not WNL notify MD.   MD Notified: Macdonald Savoy MD  Call Time: 1055 Arrival Time: 1105 End Time: 1200  Lacretia Piccolo, RN

## 2023-12-19 NOTE — Evaluation (Signed)
 Physical Therapy Evaluation Patient Details Name: Kaitlyn Adams MRN: [id number] DOB: 09/05/1950 Today's Date: 12/19/2023  History of Present Illness  Kaitlyn Adams is a 73 y.o. female past medical history of adrenal insufficiency on steroids, fibromyalgia, paroxysmal atrial fibrillation orthostatic hypotension chronic kidney disease who comes in for 3 weeks of weakness generalized poor appetite and unintentional weight loss, she relates that it got worse her weakness over the last 3 days  Clinical Impression  Pt admitted 12/18/23 with above diagnosis. Pt in bed with spouse at bedside, noted increasing weakness and dec activity tolerance over the last week. Pt reports BLE pain and chest pain in the L side this morning. Nursing notified. Pt unable to complete SLR, encouraged to complete 3 sets of 10 reps quad sets and heel slides up to TID. Progressive mobility assessment held due to angina in the presence of elevated troponin. Pt currently with functional limitations due to the deficits listed below (see PT Problem List). Pt will benefit from acute skilled PT to increase their independence and safety with mobility to allow discharge.           If plan is discharge home, recommend the following: A lot of help with walking and/or transfers;Two people to help with walking and/or transfers;A lot of help with bathing/dressing/bathroom;Two people to help with bathing/dressing/bathroom;Assistance with cooking/housework;Assist for transportation;Help with stairs or ramp for entrance   Can travel by private vehicle   No    Equipment Recommendations Other (comment) (TBD with further mobility assessment)  Recommendations for Other Services       Functional Status Assessment Patient has had a recent decline in their functional status and demonstrates the ability to make significant improvements in function in a reasonable and predictable amount of time.     Precautions / Restrictions  Precautions Precautions: Fall Restrictions Weight Bearing Restrictions Per Provider Order: No      Mobility  Bed Mobility Overal bed mobility: Needs Assistance             General bed mobility comments: Pt unable to progress 2* safety, inc weakness in BLE, pain in BLE thighs, chest pain noted, nursing notified    Transfers                        Ambulation/Gait                  Stairs            Wheelchair Mobility     Tilt Bed    Modified Rankin (Stroke Patients Only)       Balance                                             Pertinent Vitals/Pain Pain Assessment Pain Assessment: 0-10 Pain Score: 4  Pain Location: thigh Pain Descriptors / Indicators: Aching Pain Intervention(s): Monitored during session, Limited activity within patient's tolerance    Home Living Family/patient expects to be discharged to:: Private residence Living Arrangements: Spouse/significant other Available Help at Discharge: Available 24 hours/day Type of Home: House Home Access: Stairs to enter Entrance Stairs-Rails: Can reach both Entrance Stairs-Number of Steps: 3   Home Layout: Multi-level;Able to live on main level with bedroom/bathroom Home Equipment: Rollator (4 wheels);Cane - single point;Standard Environmental consultant;Shower seat      Prior Function Prior Level of Function :  Independent/Modified Independent (recent decline over the last 1 week)                     Extremity/Trunk Assessment        Lower Extremity Assessment Lower Extremity Assessment: Generalized weakness       Communication   Communication Communication: No apparent difficulties    Cognition Arousal: Alert Behavior During Therapy: WFL for tasks assessed/performed   PT - Cognitive impairments: No apparent impairments                         Following commands: Intact       Cueing Cueing Techniques: Verbal cues, Gestural cues, Tactile  cues, Visual cues     General Comments      Exercises General Exercises - Lower Extremity Quad Sets: AROM, Both, 10 reps, Supine Heel Slides: AROM, Both, 10 reps, Supine   Assessment/Plan    PT Assessment Patient needs continued PT services  PT Problem List Decreased strength;Decreased activity tolerance;Decreased mobility;Cardiopulmonary status limiting activity;Decreased range of motion;Decreased balance       PT Treatment Interventions DME instruction;Functional mobility training;Patient/family education;Balance training;Gait training;Therapeutic activities;Stair training;Therapeutic exercise    PT Goals (Current goals can be found in the Care Plan section)  Acute Rehab PT Goals Patient Stated Goal: improve mobility and independence PT Goal Formulation: With patient/family Time For Goal Achievement: 01/02/24 Potential to Achieve Goals: Good    Frequency Min 3X/week     Co-evaluation               AM-PAC PT "6 Clicks" Mobility  Outcome Measure Help needed turning from your back to your side while in a flat bed without using bedrails?: A Lot Help needed moving from lying on your back to sitting on the side of a flat bed without using bedrails?: A Lot Help needed moving to and from a bed to a chair (including a wheelchair)?: Total Help needed standing up from a chair using your arms (e.g., wheelchair or bedside chair)?: Total Help needed to walk in hospital room?: Total Help needed climbing 3-5 steps with a railing? : Total 6 Click Score: 8    End of Session   Activity Tolerance: Treatment limited secondary to medical complications (Comment) (angina in the presence of elevated troponin) Patient left: in bed;with bed alarm set;with family/visitor present;with call bell/phone within reach Nurse Communication: Mobility status PT Visit Diagnosis: Other abnormalities of gait and mobility (R26.89);Muscle weakness (generalized) (M62.81)    Time: 4098-1191 PT Time  Calculation (min) (ACUTE ONLY): 19 min   Charges:   PT Evaluation $PT Eval Low Complexity: 1 Low   PT General Charges $$ ACUTE PT VISIT: 1 Visit         Kaitlyn Adams, PT Acute Rehabilitation Services Office: [phone] 12/19/2023   Kaitlyn Adams 12/19/2023, 10:49 AM

## 2023-12-19 NOTE — Progress Notes (Signed)
 [facility] PROGRESS NOTE    Progress Note  [name]  OZH:[id number] DOB: September 14, 1950 DOA: 12/18/2023 PCP: Jess Morita, MD     Brief Narrative:   Kaitlyn Adams is an 73 y.o. female past medical history of adrenal insufficiency on steroids, paroxysmal atrial fibrillation on anticoagulation, chronic kidney disease stage III weeks comes in with 3 weeks of generalized weakness poor appetite and unintentional weight loss, is been noncompliant with her steroids for the last 2 to 3 days she does relate sick contacts.  Assessment/Plan:   Severe hypokalemia/generalized weakness: On admission it was less than 2 started repletion IV and orally. This morning is 2.6 continue aggressive oral repletion. Part of her weakness was likely due to her severe hypokalemia. She relates is slightly improved this morning. PT OT eval is pending.  Nausea and vomiting: Question relative adrenal insufficiency plus or minus viral infection. Multiple imaging done in the ED showed no acute findings She was started on IV fluids and IV Zofran . GI panel is pending, UA showed no signs of infection. She denies any diarrhea. Continue to hold antibiotics  Acute kidney injury on chronic kidney disease stage IIIa: Likely hemodynamically mediated. With baseline creatinine 1.2 was started on IV fluids her creatinine is improving. Continue to hold diuretic therapy. Continue IV fluid resuscitation for an additional 24 hours.  Adrenal insufficiency (HCC) She relates she had missed several doses of her steroids started on IV Solu-Medrol  now switched to oral hydrocortisone  as she is able to tolerate orals.  Leukocytosis: Of unclear etiology question reactive she has remained afebrile she is on steroids. GI panel is pending. UA and chest x-ray showed no evidence of infection. Question of due to steroids. Check blood cultures.  Orthostatic hypotension: Likely due to hypovolemia started on IV  fluids and improved. Change IV fluids to half-normal saline  Vitamin B12 deficiency: B12 200, start subcutaneous repletion daily.  Hypothyroidism: TSH was elevated on admission, Will need to be rechecked at 6 to 8 weeks. Continue home dose of Synthroid . Question if she has been compliant with her Synthroid .  Thrombocytosis: Likely reactive now is improving  Paroxysmal atrial fibrillation (HCC) Has remained in sinus rhythm. No events on telemetry.   DVT prophylaxis: lovenox  Family Communication:Husband Status is: Inpatient Remains inpatient appropriate because: Generalized weakness    Code Status:     Code Status Orders  (From admission, onward)           Start     Ordered   12/18/23 1805  Full code  Continuous       Question:  By:  Answer:  Consent: discussion documented in EHR   12/18/23 1808           Code Status History     Date Active Date Inactive Code Status Order ID Comments User Context   12/17/2022 0232 12/18/2022 2216 DNR [id number]  Dea Evert, DO Inpatient   10/25/2018 1642 10/26/2018 1656 DNR [id number]  Verlyn Goad, MD Inpatient   04/26/2017 0244 04/28/2017 1828 Full Code [id number]  Fidencio Hue, MD ED   05/15/2015 1008 05/15/2015 1748 Full Code [id number]  Arnoldo Lapping, MD Inpatient   05/14/2015 1842 05/15/2015 1008 Full Code [id number]  Horace Lye, PA-C Inpatient         IV Access:   Peripheral IV   Procedures and diagnostic studies:   CT Cervical Spine Wo Contrast Result Date: 12/18/2023 CLINICAL DATA:  Weakness, pain, cervical radiculopathy EXAM: CT CERVICAL SPINE WITHOUT CONTRAST TECHNIQUE:  Multidetector CT imaging of the cervical spine was performed without intravenous contrast. Multiplanar CT image reconstructions were also generated. RADIATION DOSE REDUCTION: This exam was performed according to the departmental dose-optimization program which includes automated exposure control, adjustment of the mA and/or kV according to  patient size and/or use of iterative reconstruction technique. COMPARISON:  06/19/2022 FINDINGS: Alignment: Alignment is grossly anatomic. Skull base and vertebrae: No acute fracture. No primary bone lesion or focal pathologic process. Soft tissues and spinal canal: No prevertebral fluid or swelling. No visible canal hematoma. Disc levels: Stable hypertrophic changes at the C1-C2 interface. There is multilevel spondylosis most pronounced at C4-5, C5-6, and C6-7. Multilevel facet hypertrophy greatest at the C2-3, C3-4, and C4-5 levels on the right. No significant change since prior exam. Upper chest: Airway is patent. Visualized portions of the lung apices are clear. Other: Reconstructed images demonstrate no additional findings. IMPRESSION: 1. No acute cervical spine fracture. 2. Stable multilevel spondylosis and facet hypertrophy as above. Electronically Signed   By: Bobbye Burrow M.D.   On: 12/18/2023 16:49   CT Head Wo Contrast Result Date: 12/18/2023 CLINICAL DATA:  Weakness, orthostatic hypotension, vomiting EXAM: CT HEAD WITHOUT CONTRAST TECHNIQUE: Contiguous axial images were obtained from the base of the skull through the vertex without intravenous contrast. RADIATION DOSE REDUCTION: This exam was performed according to the departmental dose-optimization program which includes automated exposure control, adjustment of the mA and/or kV according to patient size and/or use of iterative reconstruction technique. COMPARISON:  06/19/2022 FINDINGS: Brain: No acute infarct or hemorrhage. Lateral ventricles and midline structures are unremarkable. No acute extra-axial fluid collections. No mass effect. Vascular: No hyperdense vessel or unexpected calcification. Skull: Normal. Negative for fracture or focal lesion. Sinuses/Orbits: No acute finding. Other: None. IMPRESSION: 1. No acute intracranial process. Electronically Signed   By: Bobbye Burrow M.D.   On: 12/18/2023 16:46   CT CHEST ABDOMEN PELVIS WO  CONTRAST Result Date: 12/18/2023 CLINICAL DATA:  Weakness, pain, unintended weight loss * Tracking Code: BO * EXAM: CT CHEST, ABDOMEN AND PELVIS WITHOUT CONTRAST CT THORACIC AND LUMBAR SPINE WITHOUT CONTRAST TECHNIQUE: Multidetector CT imaging of the chest, abdomen and pelvis was performed following the standard protocol without IV contrast. Multidetector CT imaging of the thoracic and lumbar spine was performed following the standard protocol without IV contrast. RADIATION DOSE REDUCTION: This exam was performed according to the departmental dose-optimization program which includes automated exposure control, adjustment of the mA and/or kV according to patient size and/or use of iterative reconstruction technique. COMPARISON:  Chest radiographs, 04/19/2023 FINDINGS: CT CHEST FINDINGS Cardiovascular: Aortic atherosclerosis. Normal heart size. Left and right coronary artery calcifications. No pericardial effusion. Mediastinum/Nodes: No enlarged mediastinal, hilar, or axillary lymph nodes. Surgical clips about the thyroid . Trachea, and esophagus demonstrate no significant findings. Lungs/Pleura: Lungs are clear. No pleural effusion or pneumothorax. Musculoskeletal: No chest wall mass or suspicious osseous lesions identified. CT ABDOMEN PELVIS FINDINGS Hepatobiliary: No solid liver abnormality is seen. No gallstones, gallbladder wall thickening, or biliary dilatation. Pancreas: Unremarkable. No pancreatic ductal dilatation or surrounding inflammatory changes. Spleen: Normal in size without significant abnormality. Adrenals/Urinary Tract: Adrenal glands are unremarkable. Kidneys are normal, without renal calculi, solid lesion, or hydronephrosis. Bladder is unremarkable. Stomach/Bowel: Stomach is within normal limits. Appendix appears normal. No evidence of bowel wall thickening, distention, or inflammatory changes. Vascular/Lymphatic: Aortic atherosclerosis. No enlarged abdominal or pelvic lymph nodes. Reproductive:  Status post hysterectomy. Other: No abdominal wall hernia or abnormality. No ascites. Musculoskeletal: No acute osseous findings. CT  THORACIC AND LUMBAR SPINE FINDINGS Alignment: Normal thoracic kyphosis. Normal lumbar lordosis. Vertebral bodies: No acute fracture. Superior endplate wedge deformity of L1 unchanged as compared to prior radiographs dated 04/19/2023. Disc spaces: Generally mild disc space height loss throughout the thoracic and lumbar spine, focally moderate at L5-S1. Mild anterior bridging osteophytosis throughout the thoracic spine in keeping with DISH. Paraspinous soft tissues: Unremarkable. IMPRESSION: 1. No noncontrast CT evidence of mass or lymphadenopathy in the chest, abdomen, or pelvis. 2. No acute fracture or dislocation of the thoracic or lumbar spine. Superior endplate wedge deformity of L1 unchanged as compared to prior radiographs dated 04/19/2023. 3. Coronary artery disease. Aortic Atherosclerosis (ICD10-I70.0). Electronically Signed   By: Fredricka Jenny M.D.   On: 12/18/2023 16:29   CT T-SPINE NO CHARGE Result Date: 12/18/2023 CLINICAL DATA:  Weakness, pain, unintended weight loss * Tracking Code: BO * EXAM: CT CHEST, ABDOMEN AND PELVIS WITHOUT CONTRAST CT THORACIC AND LUMBAR SPINE WITHOUT CONTRAST TECHNIQUE: Multidetector CT imaging of the chest, abdomen and pelvis was performed following the standard protocol without IV contrast. Multidetector CT imaging of the thoracic and lumbar spine was performed following the standard protocol without IV contrast. RADIATION DOSE REDUCTION: This exam was performed according to the departmental dose-optimization program which includes automated exposure control, adjustment of the mA and/or kV according to patient size and/or use of iterative reconstruction technique. COMPARISON:  Chest radiographs, 04/19/2023 FINDINGS: CT CHEST FINDINGS Cardiovascular: Aortic atherosclerosis. Normal heart size. Left and right coronary artery calcifications. No  pericardial effusion. Mediastinum/Nodes: No enlarged mediastinal, hilar, or axillary lymph nodes. Surgical clips about the thyroid . Trachea, and esophagus demonstrate no significant findings. Lungs/Pleura: Lungs are clear. No pleural effusion or pneumothorax. Musculoskeletal: No chest wall mass or suspicious osseous lesions identified. CT ABDOMEN PELVIS FINDINGS Hepatobiliary: No solid liver abnormality is seen. No gallstones, gallbladder wall thickening, or biliary dilatation. Pancreas: Unremarkable. No pancreatic ductal dilatation or surrounding inflammatory changes. Spleen: Normal in size without significant abnormality. Adrenals/Urinary Tract: Adrenal glands are unremarkable. Kidneys are normal, without renal calculi, solid lesion, or hydronephrosis. Bladder is unremarkable. Stomach/Bowel: Stomach is within normal limits. Appendix appears normal. No evidence of bowel wall thickening, distention, or inflammatory changes. Vascular/Lymphatic: Aortic atherosclerosis. No enlarged abdominal or pelvic lymph nodes. Reproductive: Status post hysterectomy. Other: No abdominal wall hernia or abnormality. No ascites. Musculoskeletal: No acute osseous findings. CT THORACIC AND LUMBAR SPINE FINDINGS Alignment: Normal thoracic kyphosis. Normal lumbar lordosis. Vertebral bodies: No acute fracture. Superior endplate wedge deformity of L1 unchanged as compared to prior radiographs dated 04/19/2023. Disc spaces: Generally mild disc space height loss throughout the thoracic and lumbar spine, focally moderate at L5-S1. Mild anterior bridging osteophytosis throughout the thoracic spine in keeping with DISH. Paraspinous soft tissues: Unremarkable. IMPRESSION: 1. No noncontrast CT evidence of mass or lymphadenopathy in the chest, abdomen, or pelvis. 2. No acute fracture or dislocation of the thoracic or lumbar spine. Superior endplate wedge deformity of L1 unchanged as compared to prior radiographs dated 04/19/2023. 3. Coronary artery  disease. Aortic Atherosclerosis (ICD10-I70.0). Electronically Signed   By: Fredricka Jenny M.D.   On: 12/18/2023 16:29   CT L-SPINE NO CHARGE Result Date: 12/18/2023 CLINICAL DATA:  Weakness, pain, unintended weight loss * Tracking Code: BO * EXAM: CT CHEST, ABDOMEN AND PELVIS WITHOUT CONTRAST CT THORACIC AND LUMBAR SPINE WITHOUT CONTRAST TECHNIQUE: Multidetector CT imaging of the chest, abdomen and pelvis was performed following the standard protocol without IV contrast. Multidetector CT imaging of the thoracic and lumbar spine was performed  following the standard protocol without IV contrast. RADIATION DOSE REDUCTION: This exam was performed according to the departmental dose-optimization program which includes automated exposure control, adjustment of the mA and/or kV according to patient size and/or use of iterative reconstruction technique. COMPARISON:  Chest radiographs, 04/19/2023 FINDINGS: CT CHEST FINDINGS Cardiovascular: Aortic atherosclerosis. Normal heart size. Left and right coronary artery calcifications. No pericardial effusion. Mediastinum/Nodes: No enlarged mediastinal, hilar, or axillary lymph nodes. Surgical clips about the thyroid . Trachea, and esophagus demonstrate no significant findings. Lungs/Pleura: Lungs are clear. No pleural effusion or pneumothorax. Musculoskeletal: No chest wall mass or suspicious osseous lesions identified. CT ABDOMEN PELVIS FINDINGS Hepatobiliary: No solid liver abnormality is seen. No gallstones, gallbladder wall thickening, or biliary dilatation. Pancreas: Unremarkable. No pancreatic ductal dilatation or surrounding inflammatory changes. Spleen: Normal in size without significant abnormality. Adrenals/Urinary Tract: Adrenal glands are unremarkable. Kidneys are normal, without renal calculi, solid lesion, or hydronephrosis. Bladder is unremarkable. Stomach/Bowel: Stomach is within normal limits. Appendix appears normal. No evidence of bowel wall thickening,  distention, or inflammatory changes. Vascular/Lymphatic: Aortic atherosclerosis. No enlarged abdominal or pelvic lymph nodes. Reproductive: Status post hysterectomy. Other: No abdominal wall hernia or abnormality. No ascites. Musculoskeletal: No acute osseous findings. CT THORACIC AND LUMBAR SPINE FINDINGS Alignment: Normal thoracic kyphosis. Normal lumbar lordosis. Vertebral bodies: No acute fracture. Superior endplate wedge deformity of L1 unchanged as compared to prior radiographs dated 04/19/2023. Disc spaces: Generally mild disc space height loss throughout the thoracic and lumbar spine, focally moderate at L5-S1. Mild anterior bridging osteophytosis throughout the thoracic spine in keeping with DISH. Paraspinous soft tissues: Unremarkable. IMPRESSION: 1. No noncontrast CT evidence of mass or lymphadenopathy in the chest, abdomen, or pelvis. 2. No acute fracture or dislocation of the thoracic or lumbar spine. Superior endplate wedge deformity of L1 unchanged as compared to prior radiographs dated 04/19/2023. 3. Coronary artery disease. Aortic Atherosclerosis (ICD10-I70.0). Electronically Signed   By: Fredricka Jenny M.D.   On: 12/18/2023 16:29   DG Chest Port 1 View Result Date: 12/18/2023 CLINICAL DATA:  Questionable sepsis - evaluate for abnormality EXAM: PORTABLE CHEST - 1 VIEW COMPARISON:  None available. FINDINGS: No focal airspace consolidation, pleural effusion, or pneumothorax. No cardiomegaly. Aortic atherosclerosis. No acute fracture or destructive lesion. Multilevel thoracic osteophytosis. IMPRESSION: No acute cardiopulmonary abnormality. Electronically Signed   By: Rance Burrows M.D.   On: 12/18/2023 13:36     Medical Consultants:   None.   Subjective:    [name] she has been able to tolerate her diet she relates she feels slightly better.  Had a bowel movement which was formed no diarrhea.  Objective:    Vitals:   12/18/23 1827 12/19/23 0138 12/19/23 0434 12/19/23  0500  BP: 133/70 121/67 122/76   Pulse: 67 66 64   Resp: 18 18 16    Temp: 98.4 F (36.9 C) 97.9 F (36.6 C) 97.9 F (36.6 C)   TempSrc:      SpO2: 100% 100% 100%   Weight:    51.5 kg   SpO2: 100 %   Intake/Output Summary (Last 24 hours) at 12/19/2023 0801 Last data filed at 12/18/2023 1615 Gross per 24 hour  Intake 700 ml  Output --  Net 700 ml   Filed Weights   12/19/23 0500  Weight: 51.5 kg    Exam: General exam: In no acute distress. Respiratory system: Good air movement and clear to auscultation. Cardiovascular system: S1 & S2 heard, RRR. No JVD. Gastrointestinal system: Abdomen is nondistended, soft  and nontender.  Extremities: No pedal edema. Skin: No rashes, lesions or ulcers Psychiatry: Judgement and insight appear normal. Mood & affect appropriate.    Data Reviewed:    Labs: Basic Metabolic Panel: Recent Labs  Lab 12/18/23 1211 12/18/23 1849 12/19/23 0521  NA 137  --  139  K <2.0*  --  2.6*  CL 113*  --  118*  CO2 15*  --  13*  GLUCOSE 100*  --  112*  BUN 27*  --  27*  CREATININE 1.64* 1.44* 1.51*  CALCIUM  8.8*  --  7.7*  MG 2.1  --   --    GFR Estimated Creatinine Clearance: 26.6 mL/min (A) (by C-G formula based on SCr of 1.51 mg/dL (H)). Liver Function Tests: Recent Labs  Lab 12/18/23 1211  AST 175*  ALT 67*  ALKPHOS 59  BILITOT 0.5  PROT 7.5  ALBUMIN 3.3*   No results for input(s): "LIPASE", "AMYLASE" in the last 168 hours. No results for input(s): "AMMONIA" in the last 168 hours. Coagulation profile No results for input(s): "INR", "PROTIME" in the last 168 hours. COVID-19 Labs  Recent Labs    12/19/23 0521  FERRITIN 17    No results found for: "SARSCOV2NAA"  CBC: Recent Labs  Lab 12/18/23 1211 12/18/23 1849 12/19/23 0521  WBC 21.0* 21.1* 26.3*  NEUTROABS 18.1*  --   --   HGB 13.5 12.1 11.0*  HCT 39.4 36.4 33.4*  MCV 82.6 83.1 86.3  PLT 482* 430* 404*   Cardiac Enzymes: No results for input(s): "CKTOTAL",  "CKMB", "CKMBINDEX", "TROPONINI" in the last 168 hours. BNP (last 3 results) No results for input(s): "PROBNP" in the last 8760 hours. CBG: Recent Labs  Lab 12/18/23 1216  GLUCAP 96   D-Dimer: No results for input(s): "DDIMER" in the last 72 hours. Hgb A1c: No results for input(s): "HGBA1C" in the last 72 hours. Lipid Profile: No results for input(s): "CHOL", "HDL", "LDLCALC", "TRIG", "CHOLHDL", "LDLDIRECT" in the last 72 hours. Thyroid  function studies: Recent Labs    12/18/23 1211  TSH 5.760*   Anemia work up: Recent Labs    12/19/23 0521  VITAMINB12 201  FOLATE 8.7  FERRITIN 17  TIBC 280  IRON 28  RETICCTPCT 2.1   Sepsis Labs: Recent Labs  Lab 12/18/23 1211 12/18/23 1222 12/18/23 1849 12/19/23 0521  WBC 21.0*  --  21.1* 26.3*  LATICACIDVEN  --  1.0  --   --    Microbiology No results found for this or any previous visit (from the past 240 hours).   Medications:    atorvastatin   20 mg Oral Daily   digoxin   0.125 mg Oral Daily   donepezil   5 mg Oral QHS   enoxaparin  (LOVENOX ) injection  30 mg Subcutaneous Q24H   fluticasone  furoate-vilanterol  1 puff Inhalation Daily   levothyroxine   25 mcg Oral Q0600   loratadine  10 mg Oral Daily   methylPREDNISolone  (SOLU-MEDROL ) injection  40 mg Intravenous Q12H   pantoprazole   40 mg Oral BID AC   potassium chloride   40 mEq Oral BID   Continuous Infusions:  sodium chloride  100 mL/hr at 12/18/23 1846      LOS: 1 day   Macdonald Savoy  [facility]  12/19/2023, 8:01 AM

## 2023-12-19 NOTE — TOC PASRR Note (Signed)
 30 Day PASRR Note   Patient Details  Name: Kaitlyn Adams Date of Birth: 03/13/1951   [facility]) CM/SW Contact:    Tessie Fila, RN Phone Number: 12/19/2023, 1:59 PM  To Whom It May Concern:  Please be advised that this patient will require a short-term nursing home stay - anticipated 30 days or less for rehabilitation and strengthening.   The plan is for return home.

## 2023-12-19 NOTE — Evaluation (Signed)
 Occupational Therapy Evaluation Patient Details Name: Kaitlyn Adams MRN: [id number] DOB: 1951/01/31 Today's Date: 12/19/2023   History of Present Illness   Kaitlyn Adams is a 73 yr old female past medical history of adrenal insufficiency on steroids, fibromyalgia, paroxysmal atrial fibrillation orthostatic hypotension chronic kidney disease who comes in for 3 weeks of weakness generalized poor appetite, and unintentional weight loss. She was found to have severe hypokalemia and and AKI.    Clinical Impressions The pt is currently presenting significantly below her baseline level of functioning for self-care management, as she is limited by the below listed deficits (see OT problem list). During the session today, she required min assist for rolling left and right in bed, as well as max assist for lower body dressing. She was also noted to be with general deconditioning and decreased activity tolerance.  She reported feelings of malaise, weakness, and L thigh and chest pain; chest pain reported to radiate to her L arm (nurse made aware). Given the aforementioned symptoms, attempts at out of bed activity deferred. She will benefit from further OT services to facilitate progressive ADL performance and to decrease the risk for further weakness and deconditioning. At current, short-term SNF rehab is recommended, however the pt & her spouse, stated they prefer the pt to return home with home health therapy at discharge.      If plan is discharge home, recommend the following:   A lot of help with bathing/dressing/bathroom;Assist for transportation;Assistance with cooking/housework;Help with stairs or ramp for entrance     Functional Status Assessment   Patient has had a recent decline in their functional status and demonstrates the ability to make significant improvements in function in a reasonable and predictable amount of time.     Equipment Recommendations   Other (comment) (defer  to next level of care)     Recommendations for Other Services          Mobility Bed Mobility Overal bed mobility: Needs Assistance Bed Mobility: Rolling Rolling: Min assist, Used rails              Transfers    General transfer comment: not attempted, due pt reporting general malaise, weakness, and pain          ADL either performed or assessed with clinical judgement   ADL Overall ADL's : Needs assistance/impaired Eating/Feeding: Set up;Bed level   Grooming: Set up;Bed level           Upper Body Dressing : Minimal assistance;Bed level   Lower Body Dressing: Bed level;Maximal assistance                        Pertinent Vitals/Pain Pain Assessment Pain Assessment: 0-10 Pain Score: 5  Pain Location: L thigh and chest Pain Intervention(s): Monitored during session, Limited activity within patient's tolerance, Patient requesting pain meds-RN notified     Extremity/Trunk Assessment Upper Extremity Assessment Upper Extremity Assessment: Overall WFL for tasks assessed;Right hand dominant   Lower Extremity Assessment Lower Extremity Assessment: Generalized weakness       Communication Communication Communication: No apparent difficulties   Cognition Arousal: Alert Behavior During Therapy: WFL for tasks assessed/performed        Following commands: Intact                  Home Living Family/patient expects to be discharged to:: Private residence Living Arrangements: Spouse/significant other Available Help at Discharge: Family Type of Home: House Home Access: Stairs to  enter Entrance Stairs-Number of Steps: 3 Entrance Stairs-Rails: Can reach both Home Layout: Multi-level;Able to live on main level with bedroom/bathroom     Bathroom Shower/Tub: Producer, television/film/video: Standard     Home Equipment: Rollator (4 wheels);Cane - single point;Standard Environmental consultant;Shower seat          Prior Functioning/Environment Prior  Level of Function : Independent/Modified Independent             Mobility Comments: Independent, prior to ~ 1 week ago. ADLs Comments: She has been limited due to weakness over the past ~1 week. Prior to that, she was independent with ADLs, driving, cooking, and cleaning.    OT Problem List: Decreased strength;Decreased activity tolerance;Impaired balance (sitting and/or standing);Decreased knowledge of use of DME or AE;Pain   OT Treatment/Interventions: Self-care/ADL training;Therapeutic exercise;Therapeutic activities;Energy conservation;Patient/family education;DME and/or AE instruction;Balance training      OT Goals(Current goals can be found in the care plan section)   Acute Rehab OT Goals Patient Stated Goal: to feel better OT Goal Formulation: With patient Time For Goal Achievement: 01/02/24 Potential to Achieve Goals: Good ADL Goals Pt Will Perform Lower Body Dressing: with supervision;sitting/lateral leans Pt Will Transfer to Toilet: with supervision;stand pivot transfer;bedside commode Pt Will Perform Toileting - Clothing Manipulation and hygiene: with supervision;sit to/from stand   OT Frequency:  Min 2X/week       AM-PAC OT "6 Clicks" Daily Activity     Outcome Measure Help from another person eating meals?: A Little Help from another person taking care of personal grooming?: A Little Help from another person toileting, which includes using toliet, bedpan, or urinal?: A Lot Help from another person bathing (including washing, rinsing, drying)?: A Lot Help from another person to put on and taking off regular upper body clothing?: A Little Help from another person to put on and taking off regular lower body clothing?: A Lot 6 Click Score: 15   End of Session Equipment Utilized During Treatment: Other (comment) (N/A) Nurse Communication: Patient requests pain meds  Activity Tolerance: Patient limited by pain;Patient limited by fatigue Patient left: in bed;with  call bell/phone within reach;with bed alarm set;with family/visitor present;with nursing/sitter in room  OT Visit Diagnosis: Muscle weakness (generalized) (M62.81);Pain Pain - Right/Left: Left Pain - part of body: Leg (and chest)                Time: 1030-1049 OT Time Calculation (min): 19 min Charges:  OT General Charges $OT Visit: 1 Visit OT Evaluation $OT Eval Moderate Complexity: 1 Mod    Yazlynn Birkeland L Nadezhda Pollitt, OTR/L 12/19/2023, 2:06 PM

## 2023-12-19 NOTE — TOC Initial Note (Signed)
 Transition of Care [facility]) - Initial/Assessment Note    Patient Details  Name: Kaitlyn Adams MRN: [id number] Date of Birth: 06/06/1951  [facility] CM/SW Contact:    Tessie Fila, RN Phone Number: 12/19/2023, 2:35 PM  Clinical Narrative:                 Met with patient and her spouse is sitting at the beside. Discussed with patient about PT/OT recommending SNF and pt hesitant but agreeable to send out referrals for SNF. Informed the pt that once bed offers are available I will come and go over offers. PASRR pending, referrals sent out for SNF. PASRR requested pending Level II. Required documents sent.   Expected Discharge Plan: Skilled Nursing Facility Barriers to Discharge: Continued Medical Work up   Patient Goals and CMS Choice Patient states their goals for this hospitalization and ongoing recovery are:: To return to her baseline CMS Medicare.gov Compare Post Acute Care list provided to:: Patient Choice offered to / list presented to : Patient [place] ownership interest in [facility].provided to:: Patient    Expected Discharge Plan and Services In-house Referral: NA Discharge Planning Services: CM Consult Post Acute Care Choice: Skilled Nursing Facility Living arrangements for the past 2 months: Single Family Home                                      Prior Living Arrangements/Services Living arrangements for the past 2 months: Single Family Home Lives with:: Spouse Patient language and need for interpreter reviewed:: Yes Do you feel safe going back to the place where you live?: Yes      Need for Family Participation in Patient Care: Yes (Comment) Care giver support system in place?: Yes (comment)   Criminal Activity/Legal Involvement Pertinent to Current Situation/Hospitalization: No - Comment as needed  Activities of Daily Living      Permission Sought/Granted Permission sought to share information with : Family  Supports Permission granted to share information with : No  Share Information with NAME: Jontue, Lucido (Spouse)  [phone]           Emotional Assessment Appearance:: Appears stated age Attitude/Demeanor/Rapport: Engaged Affect (typically observed): Accepting, Appropriate Orientation: : Oriented to Self, Oriented to Place, Oriented to  Time, Oriented to Situation Alcohol  / Substance Use: Not Applicable Psych Involvement: No (comment)  Admission diagnosis:  Hypokalemia [E87.6] Elevated troponin [R79.89] Generalized weakness [R53.1] AKI (acute kidney injury) (HCC) [N17.9] Patient Active Problem List   Diagnosis Date Noted   AKI (acute kidney injury) (HCC) 12/18/2023   Nausea and vomiting 12/18/2023   Frequent falls 02/07/2023   Candida esophagitis (HCC) 12/18/2022   Benign neoplasm of ascending colon 12/18/2022   Benign neoplasm of transverse colon 12/18/2022   Anal stricture 12/18/2022   CKD (chronic kidney disease) stage 3, GFR 30-59 ml/min (HCC) 12/17/2022   Adrenal insufficiency (HCC) 11/06/2020   Disorder of bone 11/06/2020   Disorder of calcium  metabolism 11/06/2020   Vitamin D  deficiency 11/06/2020   Hypokalemia 10/25/2018   Moderate protein-calorie malnutrition (HCC) 08/01/2018   Upper airway cough syndrome 05/22/2017   Symptomatic anemia 04/26/2017   Chronic constipation 07/07/2016   History of colonic polyps 07/07/2016   Chronic anticoagulation 07/07/2016   Iron deficiency anemia 06/15/2016   Anxiety and depression 05/18/2016   Physical exam 11/18/2015   Pain in joint, ankle and foot 11/16/2015   PAT (paroxysmal  atrial tachycardia) (HCC) 09/08/2015   Hyperlipidemia 07/30/2015   CAD (coronary artery disease)    Chest pain at rest, not due to CAD, possible due to a fib. 05/15/2015   Chronic bronchitis (HCC) 06/17/2011   Orthostatic hypotension 12/06/2010   Paroxysmal atrial fibrillation (HCC)    Renal insufficiency    Myalgia    Hypothyroidism     Glaucoma    GERD, SEVERE 08/19/2010   Osteoarthritis 04/17/2008   FIBROMYALGIA 04/17/2008   PCP:  Jess Morita, MD Pharmacy:   [facility] [phone] - [place], [place] - [address] [place] [address] Phone: [phone] Fax: [phone]     Social Drivers of Health (SDOH) Social History: SDOH Screenings   Food Insecurity: No Food Insecurity (12/18/2023)  Housing: Low Risk  (12/18/2023)  Transportation Needs: No Transportation Needs (12/18/2023)  Utilities: Not At Risk (12/18/2023)  Alcohol  Screen: Low Risk  (09/20/2023)  Depression (PHQ2-9): Low Risk  (10/20/2023)  Financial Resource Strain: Low Risk  (09/20/2023)  Physical Activity: Insufficiently Active (09/20/2023)  Social Connections: Moderately Isolated (12/18/2023)  Stress: No Stress Concern Present (09/20/2023)  Tobacco Use: Low Risk  (12/18/2023)  Health Literacy: Adequate Health Literacy (10/20/2023)   SDOH Interventions:     Readmission Risk Interventions    12/19/2023    2:30 PM  Readmission Risk Prevention Plan  Transportation Screening Complete  PCP or Specialist Appt within 5-7 Days Complete  Home Care Screening Complete  Medication Review (RN CM) Complete

## 2023-12-20 ENCOUNTER — Other Ambulatory Visit (HOSPITAL_COMMUNITY)

## 2023-12-20 ENCOUNTER — Inpatient Hospital Stay (HOSPITAL_COMMUNITY)

## 2023-12-20 DIAGNOSIS — R7989 Other specified abnormal findings of blood chemistry: Secondary | ICD-10-CM | POA: Diagnosis not present

## 2023-12-20 DIAGNOSIS — E7800 Pure hypercholesterolemia, unspecified: Secondary | ICD-10-CM

## 2023-12-20 DIAGNOSIS — E2740 Unspecified adrenocortical insufficiency: Secondary | ICD-10-CM | POA: Diagnosis not present

## 2023-12-20 DIAGNOSIS — I4510 Unspecified right bundle-branch block: Secondary | ICD-10-CM

## 2023-12-20 DIAGNOSIS — R079 Chest pain, unspecified: Secondary | ICD-10-CM

## 2023-12-20 DIAGNOSIS — I2510 Atherosclerotic heart disease of native coronary artery without angina pectoris: Secondary | ICD-10-CM | POA: Diagnosis not present

## 2023-12-20 DIAGNOSIS — I2583 Coronary atherosclerosis due to lipid rich plaque: Secondary | ICD-10-CM

## 2023-12-20 DIAGNOSIS — I951 Orthostatic hypotension: Secondary | ICD-10-CM | POA: Diagnosis not present

## 2023-12-20 DIAGNOSIS — I480 Paroxysmal atrial fibrillation: Secondary | ICD-10-CM

## 2023-12-20 DIAGNOSIS — E271 Primary adrenocortical insufficiency: Secondary | ICD-10-CM

## 2023-12-20 DIAGNOSIS — I455 Other specified heart block: Secondary | ICD-10-CM

## 2023-12-20 DIAGNOSIS — N179 Acute kidney failure, unspecified: Secondary | ICD-10-CM | POA: Diagnosis not present

## 2023-12-20 LAB — LIPID PANEL
Cholesterol: 139 mg/dL (ref 0–200)
HDL: 30 mg/dL — ABNORMAL LOW (ref >40)
LDL Cholesterol: 67 mg/dL (ref 0–99)
Total CHOL/HDL Ratio: 4.6 ratio
Triglycerides: 211 mg/dL — ABNORMAL HIGH (ref <150)
VLDL: 42 mg/dL — ABNORMAL HIGH (ref 0–40)

## 2023-12-20 LAB — ECHOCARDIOGRAM COMPLETE
AR max vel: 2.19 cm2
AV Area VTI: 2.05 cm2
AV Area mean vel: 1.94 cm2
AV Mean grad: 4 mmHg
AV Peak grad: 8.2 mmHg
Ao pk vel: 1.43 m/s
Area-P 1/2: 3.23 cm2
Calc EF: 65.9 %
MV VTI: 2.2 cm2
S' Lateral: 2 cm
Single Plane A2C EF: 67.4 %
Single Plane A4C EF: 69.2 %
Weight: 1828.72 [oz_av]

## 2023-12-20 LAB — BASIC METABOLIC PANEL WITH GFR
Anion gap: 5 (ref 5–15)
BUN: 21 mg/dL (ref 8–23)
CO2: 12 mmol/L — ABNORMAL LOW (ref 22–32)
Calcium: 6.9 mg/dL — ABNORMAL LOW (ref 8.9–10.3)
Chloride: 120 mmol/L — ABNORMAL HIGH (ref 98–111)
Creatinine, Ser: 1.32 mg/dL — ABNORMAL HIGH (ref 0.44–1.00)
GFR, Estimated: 42 mL/min — ABNORMAL LOW (ref >60)
Glucose, Bld: 80 mg/dL (ref 70–99)
Potassium: 3 mmol/L — ABNORMAL LOW (ref 3.5–5.1)
Sodium: 137 mmol/L (ref 135–145)

## 2023-12-20 MED ORDER — SODIUM BICARBONATE 8.4 % IV SOLN
INTRAVENOUS | Status: DC
  Filled 2023-12-20: qty 150
  Filled 2023-12-20: qty 1000

## 2023-12-20 MED ORDER — CALCIUM CARBONATE 1250 (500 CA) MG PO TABS
1.0000 | ORAL_TABLET | Freq: Three times a day (TID) | ORAL | Status: DC
  Administered 2023-12-20 – 2023-12-22 (×6): 1250 mg via ORAL
  Filled 2023-12-20 (×6): qty 1

## 2023-12-20 MED ORDER — POTASSIUM CHLORIDE CRYS ER 20 MEQ PO TBCR
40.0000 meq | EXTENDED_RELEASE_TABLET | ORAL | Status: AC
  Administered 2023-12-20 (×2): 40 meq via ORAL
  Filled 2023-12-20 (×2): qty 2

## 2023-12-20 NOTE — Progress Notes (Addendum)
 Progress Note  Patient Name: Kaitlyn Adams Date of Encounter: 12/20/2023  [facility] Cardiologist: Peter Swaziland, MD   Patient Profile     Subjective   Denies any shortness of breath.  Still has some mild chest wall tenderness to palpation over the left upper chest and left axilla  Inpatient Medications    Scheduled Meds:  aspirin   325 mg Oral Daily   atorvastatin   20 mg Oral Daily   calcium  carbonate  1 tablet Oral TID WC   cyanocobalamin   1,000 mcg Subcutaneous Q1200   donepezil   5 mg Oral QHS   enoxaparin  (LOVENOX ) injection  30 mg Subcutaneous Q24H   fluticasone  furoate-vilanterol  1 puff Inhalation Daily   hydrocortisone   10 mg Oral BID   levothyroxine   25 mcg Oral Q0600   loratadine  10 mg Oral Daily   pantoprazole   40 mg Oral BID AC   Continuous Infusions:  sodium bicarbonate  150 mEq in dextrose  5 % 1,150 mL infusion 100 mL/hr at 12/20/23 0855   PRN Meds: acetaminophen  **OR** acetaminophen , albuterol , ALPRAZolam , nitroGLYCERIN , ondansetron  **OR** ondansetron  (ZOFRAN ) IV, polyethylene glycol   Vital Signs    Vitals:   12/19/23 1821 12/19/23 1907 12/20/23 0442 12/20/23 0500  BP: 109/73 115/73 112/65   Pulse: 72 70 60   Resp: 16 15 15    Temp: 98 F (36.7 C) 97.9 F (36.6 C) 98.3 F (36.8 C)   TempSrc: Oral     SpO2: 100% 100% 100%   Weight:    51.8 kg    Intake/Output Summary (Last 24 hours) at 12/20/2023 1334 Last data filed at 12/20/2023 0850 Gross per 24 hour  Intake 466.76 ml  Output --  Net 466.76 ml      12/20/2023    5:00 AM 12/19/2023    5:00 AM 10/20/2023    8:04 AM  Last 3 Weights  Weight (lbs) 114 lb 4.7 oz 113 lb 8 oz 96 lb  Weight (kg) 51.844 kg 51.483 kg 43.545 kg      Telemetry    Normal sinus rhythm- Personally Reviewed  ECG    No new EKG to review- Personally Reviewed  Physical Exam   GEN: No acute distress.   Neck: No JVD Cardiac: RRR, no murmurs, rubs, or gallops.  Respiratory: Clear to auscultation  bilaterally. GI: Soft, nontender, non-distended  MS: No edema; No deformity. Neuro:  Nonfocal  Psych: Normal affect   Labs    High Sensitivity Troponin:   Recent Labs  Lab 12/18/23 1211 12/18/23 1515 12/19/23 1117 12/19/23 1243  TROPONINIHS 214* 184* 138* 139*      Chemistry Recent Labs  Lab 12/18/23 1211 12/18/23 1849 12/19/23 0521 12/20/23 0525  NA 137  --  139 137  K <2.0*  --  2.6* 3.0*  CL 113*  --  118* 120*  CO2 15*  --  13* 12*  GLUCOSE 100*  --  112* 80  BUN 27*  --  27* 21  CREATININE 1.64* 1.44* 1.51* 1.32*  CALCIUM  8.8*  --  7.7* 6.9*  PROT 7.5  --   --   --   ALBUMIN 3.3*  --   --   --   AST 175*  --   --   --   ALT 67*  --   --   --   ALKPHOS 59  --   --   --   BILITOT 0.5  --   --   --   GFRNONAA 33*  38* 36* 42*  ANIONGAP 9  --  8 5     Hematology Recent Labs  Lab 12/18/23 1211 12/18/23 1849 12/19/23 0521  WBC 21.0* 21.1* 26.3*  RBC 4.77 4.38 3.87  3.93  HGB 13.5 12.1 11.0*  HCT 39.4 36.4 33.4*  MCV 82.6 83.1 86.3  MCH 28.3 27.6 28.4  MCHC 34.3 33.2 32.9  RDW 15.9* 15.9* 16.5*  PLT 482* 430* 404*    BNPNo results for input(s): "BNP", "PROBNP" in the last 168 hours.   DDimer No results for input(s): "DDIMER" in the last 168 hours.   Radiology    CT Cervical Spine Wo Contrast Result Date: 12/18/2023 CLINICAL DATA:  Weakness, pain, cervical radiculopathy EXAM: CT CERVICAL SPINE WITHOUT CONTRAST TECHNIQUE: Multidetector CT imaging of the cervical spine was performed without intravenous contrast. Multiplanar CT image reconstructions were also generated. RADIATION DOSE REDUCTION: This exam was performed according to the departmental dose-optimization program which includes automated exposure control, adjustment of the mA and/or kV according to patient size and/or use of iterative reconstruction technique. COMPARISON:  06/19/2022 FINDINGS: Alignment: Alignment is grossly anatomic. Skull base and vertebrae: No acute fracture. No primary bone  lesion or focal pathologic process. Soft tissues and spinal canal: No prevertebral fluid or swelling. No visible canal hematoma. Disc levels: Stable hypertrophic changes at the C1-C2 interface. There is multilevel spondylosis most pronounced at C4-5, C5-6, and C6-7. Multilevel facet hypertrophy greatest at the C2-3, C3-4, and C4-5 levels on the right. No significant change since prior exam. Upper chest: Airway is patent. Visualized portions of the lung apices are clear. Other: Reconstructed images demonstrate no additional findings. IMPRESSION: 1. No acute cervical spine fracture. 2. Stable multilevel spondylosis and facet hypertrophy as above. Electronically Signed   By: Bobbye Burrow M.D.   On: 12/18/2023 16:49   CT Head Wo Contrast Result Date: 12/18/2023 CLINICAL DATA:  Weakness, orthostatic hypotension, vomiting EXAM: CT HEAD WITHOUT CONTRAST TECHNIQUE: Contiguous axial images were obtained from the base of the skull through the vertex without intravenous contrast. RADIATION DOSE REDUCTION: This exam was performed according to the departmental dose-optimization program which includes automated exposure control, adjustment of the mA and/or kV according to patient size and/or use of iterative reconstruction technique. COMPARISON:  06/19/2022 FINDINGS: Brain: No acute infarct or hemorrhage. Lateral ventricles and midline structures are unremarkable. No acute extra-axial fluid collections. No mass effect. Vascular: No hyperdense vessel or unexpected calcification. Skull: Normal. Negative for fracture or focal lesion. Sinuses/Orbits: No acute finding. Other: None. IMPRESSION: 1. No acute intracranial process. Electronically Signed   By: Bobbye Burrow M.D.   On: 12/18/2023 16:46   CT CHEST ABDOMEN PELVIS WO CONTRAST Result Date: 12/18/2023 CLINICAL DATA:  Weakness, pain, unintended weight loss * Tracking Code: BO * EXAM: CT CHEST, ABDOMEN AND PELVIS WITHOUT CONTRAST CT THORACIC AND LUMBAR SPINE WITHOUT  CONTRAST TECHNIQUE: Multidetector CT imaging of the chest, abdomen and pelvis was performed following the standard protocol without IV contrast. Multidetector CT imaging of the thoracic and lumbar spine was performed following the standard protocol without IV contrast. RADIATION DOSE REDUCTION: This exam was performed according to the departmental dose-optimization program which includes automated exposure control, adjustment of the mA and/or kV according to patient size and/or use of iterative reconstruction technique. COMPARISON:  Chest radiographs, 04/19/2023 FINDINGS: CT CHEST FINDINGS Cardiovascular: Aortic atherosclerosis. Normal heart size. Left and right coronary artery calcifications. No pericardial effusion. Mediastinum/Nodes: No enlarged mediastinal, hilar, or axillary lymph nodes. Surgical clips about the thyroid . Trachea, and  esophagus demonstrate no significant findings. Lungs/Pleura: Lungs are clear. No pleural effusion or pneumothorax. Musculoskeletal: No chest wall mass or suspicious osseous lesions identified. CT ABDOMEN PELVIS FINDINGS Hepatobiliary: No solid liver abnormality is seen. No gallstones, gallbladder wall thickening, or biliary dilatation. Pancreas: Unremarkable. No pancreatic ductal dilatation or surrounding inflammatory changes. Spleen: Normal in size without significant abnormality. Adrenals/Urinary Tract: Adrenal glands are unremarkable. Kidneys are normal, without renal calculi, solid lesion, or hydronephrosis. Bladder is unremarkable. Stomach/Bowel: Stomach is within normal limits. Appendix appears normal. No evidence of bowel wall thickening, distention, or inflammatory changes. Vascular/Lymphatic: Aortic atherosclerosis. No enlarged abdominal or pelvic lymph nodes. Reproductive: Status post hysterectomy. Other: No abdominal wall hernia or abnormality. No ascites. Musculoskeletal: No acute osseous findings. CT THORACIC AND LUMBAR SPINE FINDINGS Alignment: Normal thoracic  kyphosis. Normal lumbar lordosis. Vertebral bodies: No acute fracture. Superior endplate wedge deformity of L1 unchanged as compared to prior radiographs dated 04/19/2023. Disc spaces: Generally mild disc space height loss throughout the thoracic and lumbar spine, focally moderate at L5-S1. Mild anterior bridging osteophytosis throughout the thoracic spine in keeping with DISH. Paraspinous soft tissues: Unremarkable. IMPRESSION: 1. No noncontrast CT evidence of mass or lymphadenopathy in the chest, abdomen, or pelvis. 2. No acute fracture or dislocation of the thoracic or lumbar spine. Superior endplate wedge deformity of L1 unchanged as compared to prior radiographs dated 04/19/2023. 3. Coronary artery disease. Aortic Atherosclerosis (ICD10-I70.0). Electronically Signed   By: Fredricka Jenny M.D.   On: 12/18/2023 16:29   CT T-SPINE NO CHARGE Result Date: 12/18/2023 CLINICAL DATA:  Weakness, pain, unintended weight loss * Tracking Code: BO * EXAM: CT CHEST, ABDOMEN AND PELVIS WITHOUT CONTRAST CT THORACIC AND LUMBAR SPINE WITHOUT CONTRAST TECHNIQUE: Multidetector CT imaging of the chest, abdomen and pelvis was performed following the standard protocol without IV contrast. Multidetector CT imaging of the thoracic and lumbar spine was performed following the standard protocol without IV contrast. RADIATION DOSE REDUCTION: This exam was performed according to the departmental dose-optimization program which includes automated exposure control, adjustment of the mA and/or kV according to patient size and/or use of iterative reconstruction technique. COMPARISON:  Chest radiographs, 04/19/2023 FINDINGS: CT CHEST FINDINGS Cardiovascular: Aortic atherosclerosis. Normal heart size. Left and right coronary artery calcifications. No pericardial effusion. Mediastinum/Nodes: No enlarged mediastinal, hilar, or axillary lymph nodes. Surgical clips about the thyroid . Trachea, and esophagus demonstrate no significant findings.  Lungs/Pleura: Lungs are clear. No pleural effusion or pneumothorax. Musculoskeletal: No chest wall mass or suspicious osseous lesions identified. CT ABDOMEN PELVIS FINDINGS Hepatobiliary: No solid liver abnormality is seen. No gallstones, gallbladder wall thickening, or biliary dilatation. Pancreas: Unremarkable. No pancreatic ductal dilatation or surrounding inflammatory changes. Spleen: Normal in size without significant abnormality. Adrenals/Urinary Tract: Adrenal glands are unremarkable. Kidneys are normal, without renal calculi, solid lesion, or hydronephrosis. Bladder is unremarkable. Stomach/Bowel: Stomach is within normal limits. Appendix appears normal. No evidence of bowel wall thickening, distention, or inflammatory changes. Vascular/Lymphatic: Aortic atherosclerosis. No enlarged abdominal or pelvic lymph nodes. Reproductive: Status post hysterectomy. Other: No abdominal wall hernia or abnormality. No ascites. Musculoskeletal: No acute osseous findings. CT THORACIC AND LUMBAR SPINE FINDINGS Alignment: Normal thoracic kyphosis. Normal lumbar lordosis. Vertebral bodies: No acute fracture. Superior endplate wedge deformity of L1 unchanged as compared to prior radiographs dated 04/19/2023. Disc spaces: Generally mild disc space height loss throughout the thoracic and lumbar spine, focally moderate at L5-S1. Mild anterior bridging osteophytosis throughout the thoracic spine in keeping with DISH. Paraspinous soft tissues: Unremarkable. IMPRESSION: 1. No  noncontrast CT evidence of mass or lymphadenopathy in the chest, abdomen, or pelvis. 2. No acute fracture or dislocation of the thoracic or lumbar spine. Superior endplate wedge deformity of L1 unchanged as compared to prior radiographs dated 04/19/2023. 3. Coronary artery disease. Aortic Atherosclerosis (ICD10-I70.0). Electronically Signed   By: Fredricka Jenny M.D.   On: 12/18/2023 16:29   CT L-SPINE NO CHARGE Result Date: 12/18/2023 CLINICAL DATA:  Weakness,  pain, unintended weight loss * Tracking Code: BO * EXAM: CT CHEST, ABDOMEN AND PELVIS WITHOUT CONTRAST CT THORACIC AND LUMBAR SPINE WITHOUT CONTRAST TECHNIQUE: Multidetector CT imaging of the chest, abdomen and pelvis was performed following the standard protocol without IV contrast. Multidetector CT imaging of the thoracic and lumbar spine was performed following the standard protocol without IV contrast. RADIATION DOSE REDUCTION: This exam was performed according to the departmental dose-optimization program which includes automated exposure control, adjustment of the mA and/or kV according to patient size and/or use of iterative reconstruction technique. COMPARISON:  Chest radiographs, 04/19/2023 FINDINGS: CT CHEST FINDINGS Cardiovascular: Aortic atherosclerosis. Normal heart size. Left and right coronary artery calcifications. No pericardial effusion. Mediastinum/Nodes: No enlarged mediastinal, hilar, or axillary lymph nodes. Surgical clips about the thyroid . Trachea, and esophagus demonstrate no significant findings. Lungs/Pleura: Lungs are clear. No pleural effusion or pneumothorax. Musculoskeletal: No chest wall mass or suspicious osseous lesions identified. CT ABDOMEN PELVIS FINDINGS Hepatobiliary: No solid liver abnormality is seen. No gallstones, gallbladder wall thickening, or biliary dilatation. Pancreas: Unremarkable. No pancreatic ductal dilatation or surrounding inflammatory changes. Spleen: Normal in size without significant abnormality. Adrenals/Urinary Tract: Adrenal glands are unremarkable. Kidneys are normal, without renal calculi, solid lesion, or hydronephrosis. Bladder is unremarkable. Stomach/Bowel: Stomach is within normal limits. Appendix appears normal. No evidence of bowel wall thickening, distention, or inflammatory changes. Vascular/Lymphatic: Aortic atherosclerosis. No enlarged abdominal or pelvic lymph nodes. Reproductive: Status post hysterectomy. Other: No abdominal wall hernia or  abnormality. No ascites. Musculoskeletal: No acute osseous findings. CT THORACIC AND LUMBAR SPINE FINDINGS Alignment: Normal thoracic kyphosis. Normal lumbar lordosis. Vertebral bodies: No acute fracture. Superior endplate wedge deformity of L1 unchanged as compared to prior radiographs dated 04/19/2023. Disc spaces: Generally mild disc space height loss throughout the thoracic and lumbar spine, focally moderate at L5-S1. Mild anterior bridging osteophytosis throughout the thoracic spine in keeping with DISH. Paraspinous soft tissues: Unremarkable. IMPRESSION: 1. No noncontrast CT evidence of mass or lymphadenopathy in the chest, abdomen, or pelvis. 2. No acute fracture or dislocation of the thoracic or lumbar spine. Superior endplate wedge deformity of L1 unchanged as compared to prior radiographs dated 04/19/2023. 3. Coronary artery disease. Aortic Atherosclerosis (ICD10-I70.0). Electronically Signed   By: Fredricka Jenny M.D.   On: 12/18/2023 16:29    Patient Profile     73 y.o. female with a hx of PAT, PAF, chronic right bundle branch block, hypertension, hyperlipidemia, hypothyroidism s/p thyroidectomy, Addison's disease on Florinef  and hydrocortisone , asthma, GERD, fibromyalgia, nonobstructive CAD by left heart cath, history of syncope, orthostatic hypotension who is being seen 12/19/2023 for the evaluation of chest pain at the request of Dr. Versa Gore.   Assessment & Plan      Chest pain Elevated troponin ASCAD HLD Her chest pain started a few days back prior to her nausea vomiting diaphoresis.  At that time she had achiness throughout her body especially in her muscles in her legs which likely was probably due to a viral syndrome.  Pain in her chest was reproducible with palpation both at that time  and today on exam.  She said it also hurt some when she took a deep breath then High-sensitivity troponin mildly elevated with flat trend not consistent with ACS and most consistent with demand  ischemia in the setting of profound electrolyte abnormalities 2D echo pending Although her EKG is markedly abnormal this is in the setting of severe hypokalemia and ST changes are likely related to metabolic derangements Once her potassium has been repleted if EKG remains significantly abnormal compared to prior EKGs then would recommend further ischemic workup She had a cath in 2016 showing nonobstructive CAD Continue aspirin  but okay to decrease to 81 mg daily Continue atorvastatin  20 mg daily LDL at goal at 67   PAF/PAT Right bundle branch block Transient sinus pauses She has only had 1 episode of PAF and that was in 2016 in the setting of chest pain and has worn multiple heart monitor showing only PAT Initially treated with Xarelto  in 2016 but subsequently stopped due to a history of GI bleeds and recurrent falls She was on digoxin  PTA but was noted to have sinus pauses on telemetry earlier in admission and dig was stopped and would not restart especially since she has normal LVF. No further sinus pauses on tele.     Metabolic derangements Hypokalemia Adrenal insufficiency Recent gastroenteritis Recently had diffuse body aches followed by nausea/vomiting/diarrhea a few days ago Also had not been taking her cortisol for a while Question whether she actually had a viral syndrome or this was all due to noncompliance with her hydrocortisone  Potassium still being repleted>> 3 this am Magnesium  normal at 2.1  Per [facility]      For questions or updates, please contact [facility] Please consult [email or web address] for contact info under        Signed, Gaylyn Keas, MD  12/20/2023, 1:34 PM

## 2023-12-20 NOTE — Progress Notes (Signed)
  Echocardiogram 2D Echocardiogram has been performed.  Geryl Dohn L Daion Ginsberg RDCS 12/20/2023, 1:52 PM

## 2023-12-20 NOTE — Progress Notes (Signed)
 Physical Therapy Treatment Patient Details Name: Kaitlyn Adams MRN: [id number] DOB: February 20, 1951 Today's Date: 12/20/2023   History of Present Illness Kaitlyn Adams is a 73 y.o. female past medical history of adrenal insufficiency on steroids, fibromyalgia, paroxysmal atrial fibrillation orthostatic hypotension chronic kidney disease who comes in for 3 weeks of weakness generalized poor appetite and unintentional weight loss    PT Comments  Much improved activity tolerance today, pt ambulated 180' with RW, no loss of balance, vital signs stable. Pt is mobilizing well enough to DC home.     If plan is discharge home, recommend the following: Assistance with cooking/housework;Assist for transportation;Help with stairs or ramp for entrance   Can travel by private vehicle     Yes  Equipment Recommendations  None recommended by PT    Recommendations for Other Services       Precautions / Restrictions Precautions Precautions: Fall Recall of Precautions/Restrictions: Intact Precaution/Restrictions Comments: h/o several falls in past 6 months, spouse stated this was due to low BP Restrictions Weight Bearing Restrictions Per Provider Order: No     Mobility  Bed Mobility Overal bed mobility: Modified Independent Bed Mobility: Supine to Sit     Supine to sit: Modified independent (Device/Increase time), HOB elevated, Used rails          Transfers Overall transfer level: Needs assistance Equipment used: Rolling walker (2 wheels) Transfers: Sit to/from Stand Sit to Stand: Supervision           General transfer comment: VCs hand placement    Ambulation/Gait Ambulation/Gait assistance: Supervision Gait Distance (Feet): 180 Feet Assistive device: Rolling walker (2 wheels) Gait Pattern/deviations: Step-through pattern, Decreased stride length Gait velocity: WFL     General Gait Details: steady, no loss of balance, HR 70s   Stairs             Wheelchair  Mobility     Tilt Bed    Modified Rankin (Stroke Patients Only)       Balance Overall balance assessment: Modified Independent                                          Communication Communication Communication: No apparent difficulties  Cognition Arousal: Alert Behavior During Therapy: WFL for tasks assessed/performed   PT - Cognitive impairments: No apparent impairments                         Following commands: Intact      Cueing    Exercises      General Comments        Pertinent Vitals/Pain Pain Assessment Pain Assessment: No/denies pain Pain Score: 0-No pain    Home Living                          Prior Function            PT Goals (current goals can now be found in the care plan section) Acute Rehab PT Goals Patient Stated Goal: improve mobility and independence PT Goal Formulation: With patient/family Time For Goal Achievement: 01/02/24 Potential to Achieve Goals: Good Progress towards PT goals: Progressing toward goals    Frequency    Min 3X/week      PT Plan      Co-evaluation  AM-PAC PT "6 Clicks" Mobility   Outcome Measure  Help needed turning from your back to your side while in a flat bed without using bedrails?: None Help needed moving from lying on your back to sitting on the side of a flat bed without using bedrails?: A Little Help needed moving to and from a bed to a chair (including a wheelchair)?: None Help needed Adams up from a chair using your arms (e.g., wheelchair or bedside chair)?: None Help needed to walk in hospital room?: None Help needed climbing 3-5 steps with a railing? : A Little 6 Click Score: 22    End of Session Equipment Utilized During Treatment: Gait belt Activity Tolerance: Patient tolerated treatment well Patient left: in chair;with call bell/phone within reach;with family/visitor present;with nursing/sitter in room Nurse Communication:  Mobility status PT Visit Diagnosis: Other abnormalities of gait and mobility (R26.89);Muscle weakness (generalized) (M62.81)     Time: 6578-4696 PT Time Calculation (min) (ACUTE ONLY): 15 min  Charges:    $Gait Training: 8-22 mins PT General Charges $$ ACUTE PT VISIT: 1 Visit                     Daymon Evans PT 12/20/2023  Acute Rehabilitation Services  Office [phone]

## 2023-12-20 NOTE — Progress Notes (Signed)
 PROGRESS NOTE    Kaitlyn Adams  ZOX:[id number] DOB: [birth year] DOA: 12/18/2023 PCP: Jess Morita, MD   Brief Narrative:   73 y.o. female past medical history of adrenal insufficiency on steroids, paroxysmal atrial fibrillation on anticoagulation, chronic kidney disease stage III presented with generalized weakness, poor appetite and unintentional weight loss in the setting of noncompliance with her steroids for the last 2 to 3 days prior to presentation.  She was admitted with generalized weakness/severe hypokalemia/nausea and vomiting along with AKI.  She was treated with IV fluids and IV steroids and subsequently switched to oral hydrocortisone .  Cardiology consulted for intermittent chest pain.  Assessment & Plan:   Severe hypokalemia -Improving.  Potassium 3 this morning.  Replace.  Repeat a.m. labs.  Nausea and vomiting - Probably from adrenal insufficiency along with possibility of viral infection - Improving.  Tolerating diet.  Continue antiemetics as needed  Diarrhea -Improving.  Follow stool GI PCR.  Intermittent chest pain Positive troponins -Currently chest pain-free.  Continue aspirin , statin.  Follow 2D echo.  Cardiology following: Follow recommendations  AKI on CKD stage IIIa -baseline creatinine of 1.2.  Creatinine improving to 1.32 this morning.  IV fluids as below  Acute metabolic acidosis -Bicarb 12 today.  Start bicarb drip.  Monitor  Adrenal insufficiency - Missed several doses of her steroids at home.  Treated with IV Solu-Medrol  on presentation.  Currently on oral hydrocortisone .  Orthostatic hypotension - Likely due to hypovolemia.  Treated with IV fluids and improved.  IV fluids as above.  Leukocytosis - Possibly reactive.  No signs of infection.  Monitor.  Cultures negative so far.  Vitamin B12 deficiency -Vitamin B12 201.  Continue supplementation  Hypothyroidism--continue levothyroxine   GERD - Continue PPI  Thrombocytosis -  Possibly reactive.  Monitor intermittently  Hypocalcemia -replace.  Repeat a.m. labs  Elevated LFTs -questionable cause.  No LFTs today.  Repeat a.m. labs  Paroxysmal A-fib - Currently rate controlled.  Outpatient follow-up  Hyperlipidemia -Continue statin  Physical deconditioning - PT recommending SNF placement.  TOC consulted.  Memory impairment - Continue donepezil     DVT prophylaxis: Lovenox  Code Status: Full Family Communication: None at bedside Disposition Plan: Status is: Inpatient Remains inpatient appropriate because: Of severity of illness.  Need for SNF placement    Consultants: Cardiology  Procedures: None  Antimicrobials: None   Subjective: Patient seen and examined at bedside.  Feels slightly better.  Still feels weak.  Diarrhea improving.  Tolerating diet.  Currently denies any chest pain.  Objective: Vitals:   12/19/23 1821 12/19/23 1907 12/20/23 0442 12/20/23 0500  BP: 109/73 115/73 112/65   Pulse: 72 70 60   Resp: 16 15 15    Temp: 98 F (36.7 C) 97.9 F (36.6 C) 98.3 F (36.8 C)   TempSrc: Oral     SpO2: 100% 100% 100%   Weight:    51.8 kg    Intake/Output Summary (Last 24 hours) at 12/20/2023 0910 Last data filed at 12/20/2023 0850 Gross per 24 hour  Intake 466.76 ml  Output --  Net 466.76 ml   Filed Weights   12/19/23 0500 12/20/23 0500  Weight: 51.5 kg 51.8 kg    Examination:  General exam: Appears calm and comfortable.  On room air. Respiratory system: Bilateral decreased breath sounds at bases Cardiovascular system: S1 & S2 heard, Rate controlled Gastrointestinal system: Abdomen is nondistended, soft and nontender. Normal bowel sounds heard. Extremities: No cyanosis, clubbing, edema  Central nervous system: Alert and oriented. No  focal neurological deficits. Moving extremities Skin: No rashes, lesions or ulcers Psychiatry: Flat affect.  Not agitated.    Data Reviewed: I have personally reviewed following labs and  imaging studies  CBC: Recent Labs  Lab 12/18/23 1211 12/18/23 1849 12/19/23 0521  WBC 21.0* 21.1* 26.3*  NEUTROABS 18.1*  --   --   HGB 13.5 12.1 11.0*  HCT 39.4 36.4 33.4*  MCV 82.6 83.1 86.3  PLT 482* 430* 404*   Basic Metabolic Panel: Recent Labs  Lab 12/18/23 1211 12/18/23 1849 12/19/23 0521 12/20/23 0525  NA 137  --  139 137  K <2.0*  --  2.6* 3.0*  CL 113*  --  118* 120*  CO2 15*  --  13* 12*  GLUCOSE 100*  --  112* 80  BUN 27*  --  27* 21  CREATININE 1.64* 1.44* 1.51* 1.32*  CALCIUM  8.8*  --  7.7* 6.9*  MG 2.1  --   --   --    GFR: Estimated Creatinine Clearance: 30.6 mL/min (A) (by C-G formula based on SCr of 1.32 mg/dL (H)). Liver Function Tests: Recent Labs  Lab 12/18/23 1211  AST 175*  ALT 67*  ALKPHOS 59  BILITOT 0.5  PROT 7.5  ALBUMIN 3.3*   No results for input(s): "LIPASE", "AMYLASE" in the last 168 hours. No results for input(s): "AMMONIA" in the last 168 hours. Coagulation Profile: No results for input(s): "INR", "PROTIME" in the last 168 hours. Cardiac Enzymes: No results for input(s): "CKTOTAL", "CKMB", "CKMBINDEX", "TROPONINI" in the last 168 hours. BNP (last 3 results) No results for input(s): "PROBNP" in the last 8760 hours. HbA1C: No results for input(s): "HGBA1C" in the last 72 hours. CBG: Recent Labs  Lab 12/18/23 1216  GLUCAP 96   Lipid Profile: Recent Labs    12/20/23 0525  CHOL 139  HDL 30*  LDLCALC 67  TRIG 829*  CHOLHDL 4.6   Thyroid  Function Tests: Recent Labs    12/18/23 1211  TSH 5.760*   Anemia Panel: Recent Labs    12/19/23 0521  VITAMINB12 201  FOLATE 8.7  FERRITIN 17  TIBC 280  IRON 28  RETICCTPCT 2.1   Sepsis Labs: Recent Labs  Lab 12/18/23 1222  LATICACIDVEN 1.0    Recent Results (from the past 240 hours)  Culture, blood (Routine X 2) w Reflex to ID Panel     Status: None (Preliminary result)   Collection Time: 12/19/23  9:40 AM   Specimen: BLOOD  Result Value Ref Range Status    Specimen Description   Final    BLOOD BLOOD LEFT ARM AEROBIC BOTTLE ONLY Performed at [facility], [address] [place], [place] [address]    Special Requests   Final    BOTTLES DRAWN AEROBIC ONLY Blood Culture results may not be optimal due to an inadequate volume of blood received in culture bottles Performed at [facility], [address] [place], [place] [address]    Culture   Final    NO GROWTH < 24 HOURS Performed at [facility], [address], [place], [place] [address]    Report Status PENDING  Incomplete  Culture, blood (Routine X 2) w Reflex to ID Panel     Status: None (Preliminary result)   Collection Time: 12/19/23  9:45 AM   Specimen: BLOOD  Result Value Ref Range Status   Specimen Description   Final    BLOOD BLOOD LEFT HAND AEROBIC BOTTLE ONLY Performed at  [facility], [address], [place], [place] [address]    Special Requests   Final    BOTTLES DRAWN AEROBIC ONLY Blood Culture results may not be optimal due to an inadequate volume of blood received in culture bottles Performed at [facility], [address], [place], [place] [address]    Culture   Final    NO GROWTH < 24 HOURS Performed at [facility], [address] [place], [place] [address]    Report Status PENDING  Incomplete         Radiology Studies: CT Cervical Spine Wo Contrast Result Date: 12/18/2023 CLINICAL DATA:  Weakness, pain, cervical radiculopathy EXAM: CT CERVICAL SPINE WITHOUT CONTRAST TECHNIQUE: Multidetector CT imaging of the cervical spine was performed without intravenous contrast. Multiplanar CT image reconstructions were also generated. RADIATION DOSE REDUCTION: This exam was performed according to the departmental dose-optimization program which includes automated exposure control, adjustment of the mA and/or kV according to patient size and/or use of iterative reconstruction  technique. COMPARISON:  06/19/2022 FINDINGS: Alignment: Alignment is grossly anatomic. Skull base and vertebrae: No acute fracture. No primary bone lesion or focal pathologic process. Soft tissues and spinal canal: No prevertebral fluid or swelling. No visible canal hematoma. Disc levels: Stable hypertrophic changes at the C1-C2 interface. There is multilevel spondylosis most pronounced at C4-5, C5-6, and C6-7. Multilevel facet hypertrophy greatest at the C2-3, C3-4, and C4-5 levels on the right. No significant change since prior exam. Upper chest: Airway is patent. Visualized portions of the lung apices are clear. Other: Reconstructed images demonstrate no additional findings. IMPRESSION: 1. No acute cervical spine fracture. 2. Stable multilevel spondylosis and facet hypertrophy as above. Electronically Signed   By: Bobbye Burrow M.D.   On: 12/18/2023 16:49   CT Head Wo Contrast Result Date: 12/18/2023 CLINICAL DATA:  Weakness, orthostatic hypotension, vomiting EXAM: CT HEAD WITHOUT CONTRAST TECHNIQUE: Contiguous axial images were obtained from the base of the skull through the vertex without intravenous contrast. RADIATION DOSE REDUCTION: This exam was performed according to the departmental dose-optimization program which includes automated exposure control, adjustment of the mA and/or kV according to patient size and/or use of iterative reconstruction technique. COMPARISON:  06/19/2022 FINDINGS: Brain: No acute infarct or hemorrhage. Lateral ventricles and midline structures are unremarkable. No acute extra-axial fluid collections. No mass effect. Vascular: No hyperdense vessel or unexpected calcification. Skull: Normal. Negative for fracture or focal lesion. Sinuses/Orbits: No acute finding. Other: None. IMPRESSION: 1. No acute intracranial process. Electronically Signed   By: Bobbye Burrow M.D.   On: 12/18/2023 16:46   CT CHEST ABDOMEN PELVIS WO CONTRAST Result Date: 12/18/2023 CLINICAL DATA:   Weakness, pain, unintended weight loss * Tracking Code: BO * EXAM: CT CHEST, ABDOMEN AND PELVIS WITHOUT CONTRAST CT THORACIC AND LUMBAR SPINE WITHOUT CONTRAST TECHNIQUE: Multidetector CT imaging of the chest, abdomen and pelvis was performed following the standard protocol without IV contrast. Multidetector CT imaging of the thoracic and lumbar spine was performed following the standard protocol without IV contrast. RADIATION DOSE REDUCTION: This exam was performed according to the departmental dose-optimization program which includes automated exposure control, adjustment of the mA and/or kV according to patient size and/or use of iterative reconstruction technique. COMPARISON:  Chest radiographs, 04/19/2023 FINDINGS: CT CHEST FINDINGS Cardiovascular: Aortic atherosclerosis. Normal heart size. Left and right coronary artery calcifications. No pericardial effusion. Mediastinum/Nodes: No enlarged mediastinal, hilar, or axillary lymph nodes. Surgical clips about the thyroid . Trachea, and esophagus demonstrate no  significant findings. Lungs/Pleura: Lungs are clear. No pleural effusion or pneumothorax. Musculoskeletal: No chest wall mass or suspicious osseous lesions identified. CT ABDOMEN PELVIS FINDINGS Hepatobiliary: No solid liver abnormality is seen. No gallstones, gallbladder wall thickening, or biliary dilatation. Pancreas: Unremarkable. No pancreatic ductal dilatation or surrounding inflammatory changes. Spleen: Normal in size without significant abnormality. Adrenals/Urinary Tract: Adrenal glands are unremarkable. Kidneys are normal, without renal calculi, solid lesion, or hydronephrosis. Bladder is unremarkable. Stomach/Bowel: Stomach is within normal limits. Appendix appears normal. No evidence of bowel wall thickening, distention, or inflammatory changes. Vascular/Lymphatic: Aortic atherosclerosis. No enlarged abdominal or pelvic lymph nodes. Reproductive: Status post hysterectomy. Other: No abdominal wall  hernia or abnormality. No ascites. Musculoskeletal: No acute osseous findings. CT THORACIC AND LUMBAR SPINE FINDINGS Alignment: Normal thoracic kyphosis. Normal lumbar lordosis. Vertebral bodies: No acute fracture. Superior endplate wedge deformity of L1 unchanged as compared to prior radiographs dated 04/19/2023. Disc spaces: Generally mild disc space height loss throughout the thoracic and lumbar spine, focally moderate at L5-S1. Mild anterior bridging osteophytosis throughout the thoracic spine in keeping with DISH. Paraspinous soft tissues: Unremarkable. IMPRESSION: 1. No noncontrast CT evidence of mass or lymphadenopathy in the chest, abdomen, or pelvis. 2. No acute fracture or dislocation of the thoracic or lumbar spine. Superior endplate wedge deformity of L1 unchanged as compared to prior radiographs dated 04/19/2023. 3. Coronary artery disease. Aortic Atherosclerosis (ICD10-I70.0). Electronically Signed   By: Fredricka Jenny M.D.   On: 12/18/2023 16:29   CT T-SPINE NO CHARGE Result Date: 12/18/2023 CLINICAL DATA:  Weakness, pain, unintended weight loss * Tracking Code: BO * EXAM: CT CHEST, ABDOMEN AND PELVIS WITHOUT CONTRAST CT THORACIC AND LUMBAR SPINE WITHOUT CONTRAST TECHNIQUE: Multidetector CT imaging of the chest, abdomen and pelvis was performed following the standard protocol without IV contrast. Multidetector CT imaging of the thoracic and lumbar spine was performed following the standard protocol without IV contrast. RADIATION DOSE REDUCTION: This exam was performed according to the departmental dose-optimization program which includes automated exposure control, adjustment of the mA and/or kV according to patient size and/or use of iterative reconstruction technique. COMPARISON:  Chest radiographs, 04/19/2023 FINDINGS: CT CHEST FINDINGS Cardiovascular: Aortic atherosclerosis. Normal heart size. Left and right coronary artery calcifications. No pericardial effusion. Mediastinum/Nodes: No enlarged  mediastinal, hilar, or axillary lymph nodes. Surgical clips about the thyroid . Trachea, and esophagus demonstrate no significant findings. Lungs/Pleura: Lungs are clear. No pleural effusion or pneumothorax. Musculoskeletal: No chest wall mass or suspicious osseous lesions identified. CT ABDOMEN PELVIS FINDINGS Hepatobiliary: No solid liver abnormality is seen. No gallstones, gallbladder wall thickening, or biliary dilatation. Pancreas: Unremarkable. No pancreatic ductal dilatation or surrounding inflammatory changes. Spleen: Normal in size without significant abnormality. Adrenals/Urinary Tract: Adrenal glands are unremarkable. Kidneys are normal, without renal calculi, solid lesion, or hydronephrosis. Bladder is unremarkable. Stomach/Bowel: Stomach is within normal limits. Appendix appears normal. No evidence of bowel wall thickening, distention, or inflammatory changes. Vascular/Lymphatic: Aortic atherosclerosis. No enlarged abdominal or pelvic lymph nodes. Reproductive: Status post hysterectomy. Other: No abdominal wall hernia or abnormality. No ascites. Musculoskeletal: No acute osseous findings. CT THORACIC AND LUMBAR SPINE FINDINGS Alignment: Normal thoracic kyphosis. Normal lumbar lordosis. Vertebral bodies: No acute fracture. Superior endplate wedge deformity of L1 unchanged as compared to prior radiographs dated 04/19/2023. Disc spaces: Generally mild disc space height loss throughout the thoracic and lumbar spine, focally moderate at L5-S1. Mild anterior bridging osteophytosis throughout the thoracic spine in keeping with DISH. Paraspinous soft tissues: Unremarkable. IMPRESSION: 1. No noncontrast CT evidence  of mass or lymphadenopathy in the chest, abdomen, or pelvis. 2. No acute fracture or dislocation of the thoracic or lumbar spine. Superior endplate wedge deformity of L1 unchanged as compared to prior radiographs dated 04/19/2023. 3. Coronary artery disease. Aortic Atherosclerosis (ICD10-I70.0).  Electronically Signed   By: Fredricka Jenny M.D.   On: 12/18/2023 16:29   CT L-SPINE NO CHARGE Result Date: 12/18/2023 CLINICAL DATA:  Weakness, pain, unintended weight loss * Tracking Code: BO * EXAM: CT CHEST, ABDOMEN AND PELVIS WITHOUT CONTRAST CT THORACIC AND LUMBAR SPINE WITHOUT CONTRAST TECHNIQUE: Multidetector CT imaging of the chest, abdomen and pelvis was performed following the standard protocol without IV contrast. Multidetector CT imaging of the thoracic and lumbar spine was performed following the standard protocol without IV contrast. RADIATION DOSE REDUCTION: This exam was performed according to the departmental dose-optimization program which includes automated exposure control, adjustment of the mA and/or kV according to patient size and/or use of iterative reconstruction technique. COMPARISON:  Chest radiographs, 04/19/2023 FINDINGS: CT CHEST FINDINGS Cardiovascular: Aortic atherosclerosis. Normal heart size. Left and right coronary artery calcifications. No pericardial effusion. Mediastinum/Nodes: No enlarged mediastinal, hilar, or axillary lymph nodes. Surgical clips about the thyroid . Trachea, and esophagus demonstrate no significant findings. Lungs/Pleura: Lungs are clear. No pleural effusion or pneumothorax. Musculoskeletal: No chest wall mass or suspicious osseous lesions identified. CT ABDOMEN PELVIS FINDINGS Hepatobiliary: No solid liver abnormality is seen. No gallstones, gallbladder wall thickening, or biliary dilatation. Pancreas: Unremarkable. No pancreatic ductal dilatation or surrounding inflammatory changes. Spleen: Normal in size without significant abnormality. Adrenals/Urinary Tract: Adrenal glands are unremarkable. Kidneys are normal, without renal calculi, solid lesion, or hydronephrosis. Bladder is unremarkable. Stomach/Bowel: Stomach is within normal limits. Appendix appears normal. No evidence of bowel wall thickening, distention, or inflammatory changes. Vascular/Lymphatic:  Aortic atherosclerosis. No enlarged abdominal or pelvic lymph nodes. Reproductive: Status post hysterectomy. Other: No abdominal wall hernia or abnormality. No ascites. Musculoskeletal: No acute osseous findings. CT THORACIC AND LUMBAR SPINE FINDINGS Alignment: Normal thoracic kyphosis. Normal lumbar lordosis. Vertebral bodies: No acute fracture. Superior endplate wedge deformity of L1 unchanged as compared to prior radiographs dated 04/19/2023. Disc spaces: Generally mild disc space height loss throughout the thoracic and lumbar spine, focally moderate at L5-S1. Mild anterior bridging osteophytosis throughout the thoracic spine in keeping with DISH. Paraspinous soft tissues: Unremarkable. IMPRESSION: 1. No noncontrast CT evidence of mass or lymphadenopathy in the chest, abdomen, or pelvis. 2. No acute fracture or dislocation of the thoracic or lumbar spine. Superior endplate wedge deformity of L1 unchanged as compared to prior radiographs dated 04/19/2023. 3. Coronary artery disease. Aortic Atherosclerosis (ICD10-I70.0). Electronically Signed   By: Fredricka Jenny M.D.   On: 12/18/2023 16:29   DG Chest Port 1 View Result Date: 12/18/2023 CLINICAL DATA:  Questionable sepsis - evaluate for abnormality EXAM: PORTABLE CHEST - 1 VIEW COMPARISON:  None available. FINDINGS: No focal airspace consolidation, pleural effusion, or pneumothorax. No cardiomegaly. Aortic atherosclerosis. No acute fracture or destructive lesion. Multilevel thoracic osteophytosis. IMPRESSION: No acute cardiopulmonary abnormality. Electronically Signed   By: Rance Burrows M.D.   On: 12/18/2023 13:36        Scheduled Meds:  aspirin   325 mg Oral Daily   atorvastatin   20 mg Oral Daily   cyanocobalamin   1,000 mcg Subcutaneous Q1200   donepezil   5 mg Oral QHS   enoxaparin  (LOVENOX ) injection  30 mg Subcutaneous Q24H   fluticasone  furoate-vilanterol  1 puff Inhalation Daily   hydrocortisone   10 mg Oral BID  levothyroxine   25 mcg Oral  Q0600   loratadine  10 mg Oral Daily   pantoprazole   40 mg Oral BID AC   potassium chloride   40 mEq Oral Q4H   Continuous Infusions:  sodium bicarbonate  150 mEq in dextrose  5 % 1,150 mL infusion 100 mL/hr at 12/20/23 0855          Audria Leather, MD [facility] 12/20/2023, 9:10 AM

## 2023-12-21 DIAGNOSIS — E2740 Unspecified adrenocortical insufficiency: Secondary | ICD-10-CM | POA: Diagnosis not present

## 2023-12-21 DIAGNOSIS — E785 Hyperlipidemia, unspecified: Secondary | ICD-10-CM | POA: Diagnosis not present

## 2023-12-21 DIAGNOSIS — R079 Chest pain, unspecified: Secondary | ICD-10-CM | POA: Diagnosis not present

## 2023-12-21 DIAGNOSIS — R7989 Other specified abnormal findings of blood chemistry: Secondary | ICD-10-CM | POA: Diagnosis not present

## 2023-12-21 DIAGNOSIS — I2510 Atherosclerotic heart disease of native coronary artery without angina pectoris: Secondary | ICD-10-CM | POA: Diagnosis not present

## 2023-12-21 LAB — GASTROINTESTINAL PANEL BY PCR, STOOL (REPLACES STOOL CULTURE)
Adenovirus F40/41: NOT DETECTED
Astrovirus: NOT DETECTED
Campylobacter species: NOT DETECTED
Cryptosporidium: NOT DETECTED
Cyclospora cayetanensis: NOT DETECTED
Entamoeba histolytica: NOT DETECTED
Enteroaggregative E coli (EAEC): NOT DETECTED
Enteropathogenic E coli (EPEC): NOT DETECTED
Enterotoxigenic E coli (ETEC): DETECTED — AB
Giardia lamblia: NOT DETECTED
Norovirus GI/GII: DETECTED — AB
Plesimonas shigelloides: NOT DETECTED
Rotavirus A: NOT DETECTED
Salmonella species: NOT DETECTED
Sapovirus (I, II, IV, and V): NOT DETECTED
Shiga like toxin producing E coli (STEC): NOT DETECTED
Shigella/Enteroinvasive E coli (EIEC): NOT DETECTED
Vibrio cholerae: NOT DETECTED
Vibrio species: NOT DETECTED
Yersinia enterocolitica: NOT DETECTED

## 2023-12-21 LAB — CBC WITH DIFFERENTIAL/PLATELET
Abs Immature Granulocytes: 0.05 10*3/uL (ref 0.00–0.07)
Basophils Absolute: 0 10*3/uL (ref 0.0–0.1)
Basophils Relative: 0 %
Eosinophils Absolute: 0 10*3/uL (ref 0.0–0.5)
Eosinophils Relative: 0 %
HCT: 32.6 % — ABNORMAL LOW (ref 36.0–46.0)
Hemoglobin: 10.7 g/dL — ABNORMAL LOW (ref 12.0–15.0)
Immature Granulocytes: 0 %
Lymphocytes Relative: 16 %
Lymphs Abs: 1.8 10*3/uL (ref 0.7–4.0)
MCH: 28.2 pg (ref 26.0–34.0)
MCHC: 32.8 g/dL (ref 30.0–36.0)
MCV: 86 fL (ref 80.0–100.0)
Monocytes Absolute: 0.7 10*3/uL (ref 0.1–1.0)
Monocytes Relative: 6 %
Neutro Abs: 8.5 10*3/uL — ABNORMAL HIGH (ref 1.7–7.7)
Neutrophils Relative %: 78 %
Platelets: 325 10*3/uL (ref 150–400)
RBC: 3.79 MIL/uL — ABNORMAL LOW (ref 3.87–5.11)
RDW: 16.4 % — ABNORMAL HIGH (ref 11.5–15.5)
WBC: 11.1 10*3/uL — ABNORMAL HIGH (ref 4.0–10.5)
nRBC: 0 % (ref 0.0–0.2)

## 2023-12-21 LAB — COMPREHENSIVE METABOLIC PANEL WITH GFR
ALT: 79 U/L — ABNORMAL HIGH (ref 0–44)
AST: 175 U/L — ABNORMAL HIGH (ref 15–41)
Albumin: 2.5 g/dL — ABNORMAL LOW (ref 3.5–5.0)
Alkaline Phosphatase: 49 U/L (ref 38–126)
Anion gap: 7 (ref 5–15)
BUN: 14 mg/dL (ref 8–23)
CO2: 14 mmol/L — ABNORMAL LOW (ref 22–32)
Calcium: 6.7 mg/dL — ABNORMAL LOW (ref 8.9–10.3)
Chloride: 116 mmol/L — ABNORMAL HIGH (ref 98–111)
Creatinine, Ser: 1.09 mg/dL — ABNORMAL HIGH (ref 0.44–1.00)
GFR, Estimated: 53 mL/min — ABNORMAL LOW (ref >60)
Glucose, Bld: 84 mg/dL (ref 70–99)
Potassium: 3 mmol/L — ABNORMAL LOW (ref 3.5–5.1)
Sodium: 137 mmol/L (ref 135–145)
Total Bilirubin: 0.6 mg/dL (ref 0.0–1.2)
Total Protein: 5.6 g/dL — ABNORMAL LOW (ref 6.5–8.1)

## 2023-12-21 LAB — MAGNESIUM: Magnesium: 1.8 mg/dL (ref 1.7–2.4)

## 2023-12-21 MED ORDER — MAGNESIUM OXIDE -MG SUPPLEMENT 400 (240 MG) MG PO TABS
800.0000 mg | ORAL_TABLET | Freq: Once | ORAL | Status: AC
  Administered 2023-12-21: 800 mg via ORAL
  Filled 2023-12-21: qty 2

## 2023-12-21 MED ORDER — MAGNESIUM SULFATE 2 GM/50ML IV SOLN
2.0000 g | Freq: Once | INTRAVENOUS | Status: AC
Start: 2023-12-21 — End: 2023-12-21
  Administered 2023-12-21: 2 g via INTRAVENOUS
  Filled 2023-12-21: qty 50

## 2023-12-21 MED ORDER — POTASSIUM CHLORIDE CRYS ER 20 MEQ PO TBCR
40.0000 meq | EXTENDED_RELEASE_TABLET | ORAL | Status: AC
  Administered 2023-12-21 (×2): 40 meq via ORAL
  Filled 2023-12-21 (×2): qty 2

## 2023-12-21 NOTE — Progress Notes (Addendum)
 Progress Note  Patient Name: Kaitlyn Adams Date of Encounter: 12/21/2023  [facility] Cardiologist: Peter Swaziland, MD   Patient Profile     Subjective   Denies shortness of breath or palpitations.  Still with some mild chest wall pain with movement.  K+ still low at 3 Inpatient Medications    Scheduled Meds:  aspirin   325 mg Oral Daily   atorvastatin   20 mg Oral Daily   calcium  carbonate  1 tablet Oral TID WC   cyanocobalamin   1,000 mcg Subcutaneous Q1200   donepezil   5 mg Oral QHS   enoxaparin  (LOVENOX ) injection  30 mg Subcutaneous Q24H   fluticasone  furoate-vilanterol  1 puff Inhalation Daily   hydrocortisone   10 mg Oral BID   levothyroxine   25 mcg Oral Q0600   loratadine   10 mg Oral Daily   pantoprazole   40 mg Oral BID AC   potassium chloride   40 mEq Oral Q4H   Continuous Infusions:  sodium bicarbonate  150 mEq in dextrose  5 % 1,150 mL infusion 100 mL/hr at 12/20/23 0855   PRN Meds: acetaminophen  **OR** acetaminophen , albuterol , ALPRAZolam , nitroGLYCERIN , ondansetron  **OR** ondansetron  (ZOFRAN ) IV, polyethylene glycol   Vital Signs    Vitals:   12/20/23 2059 12/21/23 0458 12/21/23 0500 12/21/23 0730  BP: (!) 154/82 127/77    Pulse: 76 68    Resp: 18 18    Temp: 98 F (36.7 C) 97.7 F (36.5 C)    TempSrc: Oral Oral    SpO2: 100% 100%  100%  Weight:   48.4 kg     Intake/Output Summary (Last 24 hours) at 12/21/2023 0941 Last data filed at 12/20/2023 1500 Gross per 24 hour  Intake 562.11 ml  Output --  Net 562.11 ml      12/21/2023    5:00 AM 12/20/2023    5:00 AM 12/19/2023    5:00 AM  Last 3 Weights  Weight (lbs) 106 lb 11.2 oz 114 lb 4.7 oz 113 lb 8 oz  Weight (kg) 48.4 kg 51.844 kg 51.483 kg      Telemetry    Normal sinus rhythm- Personally Reviewed  ECG    No new EKG to review- Personally Reviewed  Physical Exam   GEN: Well nourished, well developed in no acute distress HEENT: Normal NECK: No JVD; No carotid bruits LYMPHATICS:  No lymphadenopathy CARDIAC:RRR, no murmurs, rubs, gallops RESPIRATORY:  Clear to auscultation without rales, wheezing or rhonchi  ABDOMEN: Soft, non-tender, non-distended MUSCULOSKELETAL:  No edema; No deformity  SKIN: Warm and dry NEUROLOGIC:  Alert and oriented x 3 PSYCHIATRIC:  Normal affect  Labs    High Sensitivity Troponin:   Recent Labs  Lab 12/18/23 1211 12/18/23 1515 12/19/23 1117 12/19/23 1243  TROPONINIHS 214* 184* 138* 139*      Chemistry Recent Labs  Lab 12/18/23 1211 12/18/23 1849 12/19/23 0521 12/20/23 0525 12/21/23 0600  NA 137  --  139 137 137  K <2.0*  --  2.6* 3.0* 3.0*  CL 113*  --  118* 120* 116*  CO2 15*  --  13* 12* 14*  GLUCOSE 100*  --  112* 80 84  BUN 27*  --  27* 21 14  CREATININE 1.64*   < > 1.51* 1.32* 1.09*  CALCIUM  8.8*  --  7.7* 6.9* 6.7*  PROT 7.5  --   --   --  5.6*  ALBUMIN 3.3*  --   --   --  2.5*  AST 175*  --   --   --  175*  ALT 67*  --   --   --  79*  ALKPHOS 59  --   --   --  49  BILITOT 0.5  --   --   --  0.6  GFRNONAA 33*   < > 36* 42* 53*  ANIONGAP 9  --  8 5 7    < > = values in this interval not displayed.     Hematology Recent Labs  Lab 12/18/23 1849 12/19/23 0521 12/21/23 0600  WBC 21.1* 26.3* 11.1*  RBC 4.38 3.87  3.93 3.79*  HGB 12.1 11.0* 10.7*  HCT 36.4 33.4* 32.6*  MCV 83.1 86.3 86.0  MCH 27.6 28.4 28.2  MCHC 33.2 32.9 32.8  RDW 15.9* 16.5* 16.4*  PLT 430* 404* 325    BNPNo results for input(s): "BNP", "PROBNP" in the last 168 hours.   DDimer No results for input(s): "DDIMER" in the last 168 hours.   Radiology    ECHOCARDIOGRAM COMPLETE Result Date: 12/20/2023    ECHOCARDIOGRAM REPORT   Patient Name:   Kaitlyn Adams Date of Exam: 12/20/2023 Medical Rec #:  [id number]          Height:       63.0 in Accession #:    [id number]         Weight:       114.3 lb Date of Birth:  [birth year]          BSA:          1.524 m Patient Age:    73 years           BP:           112/65 mmHg Patient Gender: F                   HR:           75 bpm. Exam Location:  Inpatient Procedure: 2D Echo, Cardiac Doppler, Color Doppler, 3D Echo and Strain Analysis            (Both Spectral and Color Flow Doppler were utilized during            procedure). Indications:     Chest pain  History:         Patient has prior history of Echocardiogram examinations, most                  recent 07/07/2023. CAD, Arrythmias:Atrial Fibrillation; Risk                  Factors:Dyslipidemia, Hypertension and Family History of                  Coronary Artery Disease.  Sonographer:     Juanita Shaw Referring Phys:  [id number] Debria Fang Diagnosing Phys: Maudine Sos MD IMPRESSIONS  1. Left ventricular ejection fraction by 3D volume is 67 %. The left ventricle has normal function. The left ventricle has no regional wall motion abnormalities. Left ventricular diastolic parameters were normal. The average left ventricular global longitudinal strain is -18.6 %. The global longitudinal strain is normal.  2. Right ventricular systolic function is normal. The right ventricular size is normal. There is normal pulmonary artery systolic pressure.  3. The mitral valve is normal in structure. Trivial mitral valve regurgitation. No evidence of mitral stenosis.  4. The aortic valve is normal in structure. Aortic valve regurgitation is not visualized. No aortic stenosis is present.  5. The inferior vena cava is normal  in size with greater than 50% respiratory variability, suggesting right atrial pressure of 3 mmHg. FINDINGS  Left Ventricle: Left ventricular ejection fraction by 3D volume is 67 %. The left ventricle has normal function. The left ventricle has no regional wall motion abnormalities. The average left ventricular global longitudinal strain is -18.6 %. Strain was  performed and the global longitudinal strain is normal. The left ventricular internal cavity size was normal in size. There is no left ventricular hypertrophy. Left ventricular  diastolic parameters were normal. Indeterminate filling pressures. Right Ventricle: The right ventricular size is normal. No increase in right ventricular wall thickness. Right ventricular systolic function is normal. There is normal pulmonary artery systolic pressure. The tricuspid regurgitant velocity is 2.23 m/s, and  with an assumed right atrial pressure of 3 mmHg, the estimated right ventricular systolic pressure is 22.9 mmHg. Left Atrium: Left atrial size was normal in size. Right Atrium: Right atrial size was normal in size. Pericardium: There is no evidence of pericardial effusion. Mitral Valve: The mitral valve is normal in structure. Trivial mitral valve regurgitation. No evidence of mitral valve stenosis. MV peak gradient, 4.2 mmHg. The mean mitral valve gradient is 1.0 mmHg. Tricuspid Valve: The tricuspid valve is normal in structure. Tricuspid valve regurgitation is trivial. No evidence of tricuspid stenosis. Aortic Valve: The aortic valve is normal in structure. Aortic valve regurgitation is not visualized. No aortic stenosis is present. Aortic valve mean gradient measures 4.0 mmHg. Aortic valve peak gradient measures 8.2 mmHg. Aortic valve area, by VTI measures 2.05 cm. Pulmonic Valve: The pulmonic valve was normal in structure. Pulmonic valve regurgitation is not visualized. No evidence of pulmonic stenosis. Aorta: The aortic root is normal in size and structure. Venous: The inferior vena cava is normal in size with greater than 50% respiratory variability, suggesting right atrial pressure of 3 mmHg. IAS/Shunts: No atrial level shunt detected by color flow Doppler. Additional Comments: 3D was performed not requiring image post processing on an independent workstation and was normal.  LEFT VENTRICLE PLAX 2D LVIDd:         3.30 cm         Diastology LVIDs:         2.00 cm         LV e' medial:    9.03 cm/s LV PW:         0.90 cm         LV E/e' medial:  10.2 LV IVS:        0.70 cm         LV e'  lateral:   10.00 cm/s LVOT diam:     1.70 cm         LV E/e' lateral: 9.2 LV SV:         52 LV SV Index:   34              2D Longitudinal LVOT Area:     2.27 cm        Strain                                2D Strain GLS   -24.2 %                                (A4C): LV Volumes (MOD)  2D Strain GLS   -11.7 % LV vol d, MOD    61.0 ml       (A3C): A2C:                           2D Strain GLS   -20.0 % LV vol d, MOD    74.7 ml       (A2C): A4C:                           2D Strain GLS   -18.6 % LV vol s, MOD    19.9 ml       Avg: A2C: LV vol s, MOD    23.0 ml       3D Volume EF A4C:                           LV 3D EF:    Left LV SV MOD A2C:   41.1 ml                    ventricul LV SV MOD A4C:   74.7 ml                    ar LV SV MOD BP:    44.8 ml                    ejection                                             fraction                                             by 3D                                             volume is                                             67 %.                                 3D Volume EF:                                3D EF:        67 % RIGHT VENTRICLE             IVC RV Basal diam:  3.50 cm     IVC diam: 1.30 cm RV Mid diam:    2.00 cm RV S prime:     11.60 cm/s TAPSE (M-mode): 2.4 cm LEFT ATRIUM             Index        RIGHT ATRIUM  Index LA diam:        2.40 cm 1.57 cm/m   RA Area:     8.84 cm LA Vol (A2C):   24.6 ml 16.14 ml/m  RA Volume:   19.20 ml 12.60 ml/m LA Vol (A4C):   15.6 ml 10.23 ml/m LA Biplane Vol: 20.1 ml 13.19 ml/m  AORTIC VALVE                    PULMONIC VALVE AV Area (Vmax):    2.19 cm     PV Vmax:       1.10 m/s AV Area (Vmean):   1.94 cm     PV Peak grad:  4.8 mmHg AV Area (VTI):     2.05 cm AV Vmax:           143.00 cm/s AV Vmean:          94.900 cm/s AV VTI:            0.254 m AV Peak Grad:      8.2 mmHg AV Mean Grad:      4.0 mmHg LVOT Vmax:         138.00 cm/s LVOT Vmean:        81.000 cm/s LVOT VTI:          0.229 m  LVOT/AV VTI ratio: 0.90  AORTA Ao Root diam: 2.70 cm Ao Asc diam:  3.20 cm MITRAL VALVE               TRICUSPID VALVE MV Area (PHT): 3.23 cm    TR Peak grad:   19.9 mmHg MV Area VTI:   2.20 cm    TR Vmax:        223.00 cm/s MV Peak grad:  4.2 mmHg MV Mean grad:  1.0 mmHg    SHUNTS MV Vmax:       1.03 m/s    Systemic VTI:  0.23 m MV Vmean:      55.2 cm/s   Systemic Diam: 1.70 cm MV Decel Time: 235 msec MV E velocity: 92.10 cm/s MV A velocity: 78.00 cm/s MV E/A ratio:  1.18 Maudine Sos MD Electronically signed by Maudine Sos MD Signature Date/Time: 12/20/2023/6:45:13 PM    Final (Updated)     Patient Profile     73 y.o. female with a hx of PAT, PAF, chronic right bundle branch block, hypertension, hyperlipidemia, hypothyroidism s/p thyroidectomy, Addison's disease on Florinef  and hydrocortisone , asthma, GERD, fibromyalgia, nonobstructive CAD by left heart cath, history of syncope, orthostatic hypotension who is being seen 12/19/2023 for the evaluation of chest pain at the request of Dr. Versa Gore.   Assessment & Plan      Chest pain Elevated troponin ASCAD HLD Her chest pain started a few days back prior to her nausea vomiting diaphoresis.  At that time she had achiness throughout her body especially in her muscles in her legs which likely was probably due to a viral syndrome.  Pain in her chest was reproducible with palpation both at that time and today on exam.  She said it also hurt some when she took a deep breath then High-sensitivity troponin mildly elevated with flat trend not consistent with ACS and most consistent with demand ischemia in the setting of profound electrolyte abnormalities 2D echo pending Although her EKG is markedly abnormal this is in the setting of severe hypokalemia and ST changes are likely related to metabolic derangements as well as digoxin  effect>> dig has been stopped  due to sinus pauses Will repeat EKG today She had a cath in 2016 showing nonobstructive  CAD Continue atorvastatin  20 mg daily  LDL at goal at 67   PAF/PAT Right bundle branch block Transient sinus pauses She has only had 1 episode of PAF and that was in 2016 in the setting of chest pain and has worn multiple heart monitor showing only PAT Initially treated with Xarelto  in 2016 but subsequently stopped due to a history of GI bleeds and recurrent falls She was on digoxin  PTA but was noted to have sinus pauses on telemetry earlier in admission and dig was stopped and would not restart especially since she has normal LVF. Telemetry reviewed this morning with no evidence of sinus pauses in the past 24-hour hours   Metabolic derangements Hypokalemia Adrenal insufficiency Recent gastroenteritis Recently had diffuse body aches followed by nausea/vomiting/diarrhea a few days ago Also had not been taking her cortisol for a while Question whether she actually had a viral syndrome or this was all due to noncompliance with her hydrocortisone  Potassium again low today at 3 despite getting multiple doses of potassium Mag 1.8 this morning>> replete to keep mag > 2 Per TRH  No new recs at this time.  Would like to repeat an EKG once her potassium is greater than 4 and off digoxin  to see if her ST abnormalities resolved.  Again I do not think that her chest discomfort she was having is cardiac related and likely musculoskeletal.  Will follow at a distance for now and repeat EKG once potassium greater than 4      For questions or updates, please contact [facility] Please consult [email or web address] for contact info under        Signed, Gaylyn Keas, MD  12/21/2023, 9:41 AM

## 2023-12-21 NOTE — Plan of Care (Signed)

## 2023-12-21 NOTE — Progress Notes (Signed)
 Occupational Therapy Treatment Patient Details Name: Kaitlyn Adams MRN: [id number] DOB: 04-Feb-1951 Today's Date: 12/21/2023   History of present illness Kaitlyn Adams is a 73 yr old female with past medical history of adrenal insufficiency on steroids, fibromyalgia, paroxysmal atrial fibrillation orthostatic hypotension chronic kidney disease who comes in for 3 weeks of weakness generalized poor appetite, and unintentional weight loss   OT comments  The pt presented with much improved activity tolerance and participation, as compared to the prior OT session. She progressed to performing toileting at bathroom level and ambulating into the hall using a RW, requiring CGA overall. She reported having 2/10 chest pain. She is making gradual functional progress. Continue OT plan of care. Discharge recommendation updated to home with family assistance.       If plan is discharge home, recommend the following:  Assist for transportation;A little help with bathing/dressing/bathroom;Assistance with cooking/housework   Equipment Recommendations  None recommended by OT    Recommendations for Other Services      Precautions / Restrictions Restrictions Weight Bearing Restrictions Per Provider Order: No Other Position/Activity Restrictions: enteric precautions       Mobility Bed Mobility Overal bed mobility: Needs Assistance Bed Mobility: Supine to Sit     Supine to sit: Supervision          Transfers Overall transfer level: Needs assistance Equipment used: Rolling walker (2 wheels) Transfers: Sit to/from Stand Sit to Stand: Contact guard assist                     ADL either performed or assessed with clinical judgement   ADL                           Toilet Transfer: Contact guard assist;Rolling walker (2 wheels);Ambulation;Grab bars Toilet Transfer Details (indicate cue type and reason): The pt ambulated to and from the bathroom in her room using a RW.  She used grab bar for added support during transfer. Toileting- Clothing Manipulation and Hygiene: Contact guard assist;Sit to/from stand Toileting - Clothing Manipulation Details (indicate cue type and reason): Toileting tasks performed at bathroom level.                      Communication Communication Communication: No apparent difficulties   Cognition Arousal: Alert Behavior During Therapy: WFL for tasks assessed/performed          Following commands: Intact                      Pertinent Vitals/ Pain       Pain Assessment Pain Assessment: 0-10 Pain Score: 2  Pain Location: chest Pain Intervention(s): Monitored during session   Frequency  Min 2X/week        Progress Toward Goals  OT Goals(current goals can now be found in the care plan section)  Progress towards OT goals: Progressing toward goals  Acute Rehab OT Goals Patient Stated Goal: to return home soon OT Goal Formulation: With patient Time For Goal Achievement: 01/02/24 Potential to Achieve Goals: Good  Plan         AM-PAC OT "6 Clicks" Daily Activity     Outcome Measure   Help from another person eating meals?: None Help from another person taking care of personal grooming?: None Help from another person toileting, which includes using toliet, bedpan, or urinal?: A Little Help from another person bathing (including washing, rinsing, drying)?: A  Little Help from another person to put on and taking off regular upper body clothing?: None Help from another person to put on and taking off regular lower body clothing?: A Little 6 Click Score: 21    End of Session Equipment Utilized During Treatment: Rolling walker (2 wheels)  OT Visit Diagnosis: Muscle weakness (generalized) (M62.81);Pain Pain - part of body:  (chest)   Activity Tolerance Patient tolerated treatment well   Patient Left in chair;with call bell/phone within reach;with chair alarm set   Nurse Communication Mobility  status        Time: 4098-1191 OT Time Calculation (min): 20 min  Charges: OT General Charges $OT Visit: 1 Visit OT Treatments $Self Care/Home Management : 8-22 mins    Sheralyn Dies, OTR/L 12/21/2023, 12:38 PM

## 2023-12-21 NOTE — Progress Notes (Signed)
 PROGRESS NOTE    Kaitlyn Adams  ZOX:[id number] DOB: 09-16-1950 DOA: 12/18/2023 PCP: Jess Morita, MD   Brief Narrative:   73 y.o. female past medical history of adrenal insufficiency on steroids, paroxysmal atrial fibrillation on anticoagulation, chronic kidney disease stage III presented with generalized weakness, poor appetite and unintentional weight loss in the setting of noncompliance with her steroids for the last 2 to 3 days prior to presentation.  She was admitted with generalized weakness/severe hypokalemia/nausea and vomiting along with AKI.  She was treated with IV fluids and IV steroids and subsequently switched to oral hydrocortisone .  Cardiology consulted for intermittent chest pain.  Assessment & Plan:   Severe hypokalemia -Potassium 3 this morning.  Replace.  Repeat a.m. labs.  Nausea and vomiting - Probably from adrenal insufficiency along with possibility of viral infection - Improving.  Tolerating diet.  Continue antiemetics as needed  Diarrhea -Improving.  Follow stool GI PCR.  Intermittent chest pain Positive troponins -Currently chest pain-free.  Continue aspirin , statin.  Follow 2D echo.  Cardiology following: Follow recommendations  AKI on CKD stage IIIa -baseline creatinine of 1.2.  Creatinine improving to 1.09 this morning.  IV fluids as below  Acute metabolic acidosis -Bicarb 14 today.  Continue bicarb drip.  Monitor  Adrenal insufficiency - Missed several doses of her steroids at home.  Treated with IV Solu-Medrol  on presentation.  Currently on oral hydrocortisone .  Orthostatic hypotension - Likely due to hypovolemia.  Treated with IV fluids and improved.  IV fluids as above.  Leukocytosis - Possibly reactive.  No signs of infection.  Monitor.  Cultures negative so far.  Vitamin B12 deficiency -Vitamin B12 201.  Continue supplementation  Hypothyroidism--continue levothyroxine   GERD - Continue PPI  Thrombocytosis -  Improving  Hypocalcemia -replace.  Repeat a.m. labs  Elevated LFTs -questionable cause.  Still remain mildly elevated.  Can follow-up as an outpatient.   Paroxysmal A-fib - Currently rate controlled.  Outpatient follow-up  Hyperlipidemia - Hold statin due to elevated LFTs  Physical deconditioning - PT now recommending home health PT.    Memory impairment - Continue donepezil     DVT prophylaxis: Lovenox  Code Status: Full Family Communication: None at bedside Disposition Plan: Status is: Inpatient Remains inpatient appropriate because: Of severity of illness.     Consultants: Cardiology  Procedures: None  Antimicrobials: None   Subjective: Patient seen and examined at bedside.  Diarrhea and nausea are improving.  Still feels weak.  No fever or chest pain reported. Objective: Vitals:   12/20/23 2059 12/21/23 0458 12/21/23 0500 12/21/23 0730  BP: (!) 154/82 127/77    Pulse: 76 68    Resp: 18 18    Temp: 98 F (36.7 C) 97.7 F (36.5 C)    TempSrc: Oral Oral    SpO2: 100% 100%  100%  Weight:   48.4 kg     Intake/Output Summary (Last 24 hours) at 12/21/2023 0808 Last data filed at 12/20/2023 1500 Gross per 24 hour  Intake 680.11 ml  Output --  Net 680.11 ml   Filed Weights   12/19/23 0500 12/20/23 0500 12/21/23 0500  Weight: 51.5 kg 51.8 kg 48.4 kg    Examination:  General: Remains on room air.  No distress ENT/neck: No thyromegaly.  JVD is not elevated  respiratory: Decreased breath sounds at bases bilaterally with some crackles; no wheezing  CVS: S1-S2 heard, rate controlled currently Abdominal: Soft, nontender, slightly distended; no organomegaly,  bowel sounds are heard Extremities: Trace lower extremity edema;  no cyanosis  CNS: Awake and alert.  No focal neurologic deficit.  Moves extremities Lymph: No obvious lymphadenopathy Skin: No obvious ecchymosis/lesions  psych: Affect is mostly flat; not agitated currently.   Musculoskeletal: No obvious  joint swelling/deformity      Data Reviewed: I have personally reviewed following labs and imaging studies  CBC: Recent Labs  Lab 12/18/23 1211 12/18/23 1849 12/19/23 0521 12/21/23 0600  WBC 21.0* 21.1* 26.3* 11.1*  NEUTROABS 18.1*  --   --  8.5*  HGB 13.5 12.1 11.0* 10.7*  HCT 39.4 36.4 33.4* 32.6*  MCV 82.6 83.1 86.3 86.0  PLT 482* 430* 404* 325   Basic Metabolic Panel: Recent Labs  Lab 12/18/23 1211 12/18/23 1849 12/19/23 0521 12/20/23 0525 12/21/23 0600  NA 137  --  139 137 137  K <2.0*  --  2.6* 3.0* 3.0*  CL 113*  --  118* 120* 116*  CO2 15*  --  13* 12* 14*  GLUCOSE 100*  --  112* 80 84  BUN 27*  --  27* 21 14  CREATININE 1.64* 1.44* 1.51* 1.32* 1.09*  CALCIUM  8.8*  --  7.7* 6.9* 6.7*  MG 2.1  --   --   --  1.8   GFR: Estimated Creatinine Clearance: 34.6 mL/min (A) (by C-G formula based on SCr of 1.09 mg/dL (H)). Liver Function Tests: Recent Labs  Lab 12/18/23 1211 12/21/23 0600  AST 175* 175*  ALT 67* 79*  ALKPHOS 59 49  BILITOT 0.5 0.6  PROT 7.5 5.6*  ALBUMIN 3.3* 2.5*   No results for input(s): "LIPASE", "AMYLASE" in the last 168 hours. No results for input(s): "AMMONIA" in the last 168 hours. Coagulation Profile: No results for input(s): "INR", "PROTIME" in the last 168 hours. Cardiac Enzymes: No results for input(s): "CKTOTAL", "CKMB", "CKMBINDEX", "TROPONINI" in the last 168 hours. BNP (last 3 results) No results for input(s): "PROBNP" in the last 8760 hours. HbA1C: No results for input(s): "HGBA1C" in the last 72 hours. CBG: Recent Labs  Lab 12/18/23 1216  GLUCAP 96   Lipid Profile: Recent Labs    12/20/23 0525  CHOL 139  HDL 30*  LDLCALC 67  TRIG 161*  CHOLHDL 4.6   Thyroid  Function Tests: Recent Labs    12/18/23 1211  TSH 5.760*   Anemia Panel: Recent Labs    12/19/23 0521  VITAMINB12 201  FOLATE 8.7  FERRITIN 17  TIBC 280  IRON 28  RETICCTPCT 2.1   Sepsis Labs: Recent Labs  Lab 12/18/23 1222   LATICACIDVEN 1.0    Recent Results (from the past 240 hours)  Culture, blood (Routine X 2) w Reflex to ID Panel     Status: None (Preliminary result)   Collection Time: 12/19/23  9:40 AM   Specimen: BLOOD  Result Value Ref Range Status   Specimen Description   Final    BLOOD BLOOD LEFT ARM AEROBIC BOTTLE ONLY Performed at [facility], [address], [place], [place] [address]    Special Requests   Final    BOTTLES DRAWN AEROBIC ONLY Blood Culture results may not be optimal due to an inadequate volume of blood received in culture bottles Performed at [facility], [address] [place], [place] [address]    Culture   Final    NO GROWTH 2 DAYS Performed at [facility], [address], [place], [place] [address]    Report Status PENDING  Incomplete  Culture, blood (Routine X 2)  w Reflex to ID Panel     Status: None (Preliminary result)   Collection Time: 12/19/23  9:45 AM   Specimen: BLOOD  Result Value Ref Range Status   Specimen Description   Final    BLOOD BLOOD LEFT HAND AEROBIC BOTTLE ONLY Performed at [facility], [address] [place], [place] [address]    Special Requests   Final    BOTTLES DRAWN AEROBIC ONLY Blood Culture results may not be optimal due to an inadequate volume of blood received in culture bottles Performed at [facility], [address], [place], [place] [address]    Culture   Final    NO GROWTH 2 DAYS Performed at [facility], [address] [place], [place] [address]    Report Status PENDING  Incomplete         Radiology Studies: ECHOCARDIOGRAM COMPLETE Result Date: 12/20/2023    ECHOCARDIOGRAM REPORT   Patient Name:   AGELIKI WESTLAKE Date of Exam: 12/20/2023 Medical Rec #:  [id number]          Height:       63.0 in Accession #:    [id number]         Weight:       114.3 lb Date of Birth:  08-29-1950          BSA:          1.524 m Patient Age:     73 years           BP:           112/65 mmHg Patient Gender: F                  HR:           75 bpm. Exam Location:  Inpatient Procedure: 2D Echo, Cardiac Doppler, Color Doppler, 3D Echo and Strain Analysis            (Both Spectral and Color Flow Doppler were utilized during            procedure). Indications:     Chest pain  History:         Patient has prior history of Echocardiogram examinations, most                  recent 07/07/2023. CAD, Arrythmias:Atrial Fibrillation; Risk                  Factors:Dyslipidemia, Hypertension and Family History of                  Coronary Artery Disease.  Sonographer:     Juanita Shaw Referring Phys:  [id number] Debria Fang Diagnosing Phys: Maudine Sos MD IMPRESSIONS  1. Left ventricular ejection fraction by 3D volume is 67 %. The left ventricle has normal function. The left ventricle has no regional wall motion abnormalities. Left ventricular diastolic parameters were normal. The average left ventricular global longitudinal strain is -18.6 %. The global longitudinal strain is normal.  2. Right ventricular systolic function is normal. The right ventricular size is normal. There is normal pulmonary artery systolic pressure.  3. The mitral valve is normal in structure. Trivial mitral valve regurgitation. No evidence of mitral stenosis.  4. The aortic valve is normal in structure. Aortic valve regurgitation is not visualized. No aortic stenosis is present.  5. The inferior vena cava is normal in size with greater than 50% respiratory variability, suggesting right atrial pressure of  3 mmHg. FINDINGS  Left Ventricle: Left ventricular ejection fraction by 3D volume is 67 %. The left ventricle has normal function. The left ventricle has no regional wall motion abnormalities. The average left ventricular global longitudinal strain is -18.6 %. Strain was  performed and the global longitudinal strain is normal. The left ventricular internal cavity size was normal in size.  There is no left ventricular hypertrophy. Left ventricular diastolic parameters were normal. Indeterminate filling pressures. Right Ventricle: The right ventricular size is normal. No increase in right ventricular wall thickness. Right ventricular systolic function is normal. There is normal pulmonary artery systolic pressure. The tricuspid regurgitant velocity is 2.23 m/s, and  with an assumed right atrial pressure of 3 mmHg, the estimated right ventricular systolic pressure is 22.9 mmHg. Left Atrium: Left atrial size was normal in size. Right Atrium: Right atrial size was normal in size. Pericardium: There is no evidence of pericardial effusion. Mitral Valve: The mitral valve is normal in structure. Trivial mitral valve regurgitation. No evidence of mitral valve stenosis. MV peak gradient, 4.2 mmHg. The mean mitral valve gradient is 1.0 mmHg. Tricuspid Valve: The tricuspid valve is normal in structure. Tricuspid valve regurgitation is trivial. No evidence of tricuspid stenosis. Aortic Valve: The aortic valve is normal in structure. Aortic valve regurgitation is not visualized. No aortic stenosis is present. Aortic valve mean gradient measures 4.0 mmHg. Aortic valve peak gradient measures 8.2 mmHg. Aortic valve area, by VTI measures 2.05 cm. Pulmonic Valve: The pulmonic valve was normal in structure. Pulmonic valve regurgitation is not visualized. No evidence of pulmonic stenosis. Aorta: The aortic root is normal in size and structure. Venous: The inferior vena cava is normal in size with greater than 50% respiratory variability, suggesting right atrial pressure of 3 mmHg. IAS/Shunts: No atrial level shunt detected by color flow Doppler. Additional Comments: 3D was performed not requiring image post processing on an independent workstation and was normal.  LEFT VENTRICLE PLAX 2D LVIDd:         3.30 cm         Diastology LVIDs:         2.00 cm         LV e' medial:    9.03 cm/s LV PW:         0.90 cm         LV  E/e' medial:  10.2 LV IVS:        0.70 cm         LV e' lateral:   10.00 cm/s LVOT diam:     1.70 cm         LV E/e' lateral: 9.2 LV SV:         52 LV SV Index:   34              2D Longitudinal LVOT Area:     2.27 cm        Strain                                2D Strain GLS   -24.2 %                                (A4C): LV Volumes (MOD)               2D Strain GLS   -11.7 % LV vol d,  MOD    61.0 ml       (A3C): A2C:                           2D Strain GLS   -20.0 % LV vol d, MOD    74.7 ml       (A2C): A4C:                           2D Strain GLS   -18.6 % LV vol s, MOD    19.9 ml       Avg: A2C: LV vol s, MOD    23.0 ml       3D Volume EF A4C:                           LV 3D EF:    Left LV SV MOD A2C:   41.1 ml                    ventricul LV SV MOD A4C:   74.7 ml                    ar LV SV MOD BP:    44.8 ml                    ejection                                             fraction                                             by 3D                                             volume is                                             67 %.                                 3D Volume EF:                                3D EF:        67 % RIGHT VENTRICLE             IVC RV Basal diam:  3.50 cm     IVC diam: 1.30 cm RV Mid diam:    2.00 cm RV S prime:     11.60 cm/s TAPSE (M-mode): 2.4 cm LEFT ATRIUM             Index        RIGHT ATRIUM          Index LA diam:  2.40 cm 1.57 cm/m   RA Area:     8.84 cm LA Vol (A2C):   24.6 ml 16.14 ml/m  RA Volume:   19.20 ml 12.60 ml/m LA Vol (A4C):   15.6 ml 10.23 ml/m LA Biplane Vol: 20.1 ml 13.19 ml/m  AORTIC VALVE                    PULMONIC VALVE AV Area (Vmax):    2.19 cm     PV Vmax:       1.10 m/s AV Area (Vmean):   1.94 cm     PV Peak grad:  4.8 mmHg AV Area (VTI):     2.05 cm AV Vmax:           143.00 cm/s AV Vmean:          94.900 cm/s AV VTI:            0.254 m AV Peak Grad:      8.2 mmHg AV Mean Grad:      4.0 mmHg LVOT Vmax:         138.00 cm/s LVOT  Vmean:        81.000 cm/s LVOT VTI:          0.229 m LVOT/AV VTI ratio: 0.90  AORTA Ao Root diam: 2.70 cm Ao Asc diam:  3.20 cm MITRAL VALVE               TRICUSPID VALVE MV Area (PHT): 3.23 cm    TR Peak grad:   19.9 mmHg MV Area VTI:   2.20 cm    TR Vmax:        223.00 cm/s MV Peak grad:  4.2 mmHg MV Mean grad:  1.0 mmHg    SHUNTS MV Vmax:       1.03 m/s    Systemic VTI:  0.23 m MV Vmean:      55.2 cm/s   Systemic Diam: 1.70 cm MV Decel Time: 235 msec MV E velocity: 92.10 cm/s MV A velocity: 78.00 cm/s MV E/A ratio:  1.18 Maudine Sos MD Electronically signed by Maudine Sos MD Signature Date/Time: 12/20/2023/6:45:13 PM    Final (Updated)         Scheduled Meds:  aspirin   325 mg Oral Daily   atorvastatin   20 mg Oral Daily   calcium  carbonate  1 tablet Oral TID WC   cyanocobalamin   1,000 mcg Subcutaneous Q1200   donepezil   5 mg Oral QHS   enoxaparin  (LOVENOX ) injection  30 mg Subcutaneous Q24H   fluticasone  furoate-vilanterol  1 puff Inhalation Daily   hydrocortisone   10 mg Oral BID   levothyroxine   25 mcg Oral Q0600   loratadine   10 mg Oral Daily   pantoprazole   40 mg Oral BID AC   potassium chloride   40 mEq Oral Q4H   Continuous Infusions:  sodium bicarbonate  150 mEq in dextrose  5 % 1,150 mL infusion 100 mL/hr at 12/20/23 0855          Audria Leather, MD [facility] 12/21/2023, 8:08 AM

## 2023-12-21 NOTE — Progress Notes (Signed)
 IVT consulted to assess pt's PIV. Pt and RN reporting discomfort with flush.  This RN to bedside, discussed with patient options to replace line. Pt opting to wait, as it is no longer causing discomfort with flush. Tip of catheter visualized in vessel under US . Discussed with primary RN as well.  Will re-consult if pt unable to tolerate meds/infusions.

## 2023-12-22 ENCOUNTER — Other Ambulatory Visit (HOSPITAL_COMMUNITY): Payer: Self-pay

## 2023-12-22 DIAGNOSIS — I2510 Atherosclerotic heart disease of native coronary artery without angina pectoris: Secondary | ICD-10-CM | POA: Diagnosis not present

## 2023-12-22 DIAGNOSIS — R079 Chest pain, unspecified: Secondary | ICD-10-CM | POA: Diagnosis not present

## 2023-12-22 DIAGNOSIS — E785 Hyperlipidemia, unspecified: Secondary | ICD-10-CM | POA: Diagnosis not present

## 2023-12-22 DIAGNOSIS — R7989 Other specified abnormal findings of blood chemistry: Secondary | ICD-10-CM | POA: Diagnosis not present

## 2023-12-22 DIAGNOSIS — E2740 Unspecified adrenocortical insufficiency: Secondary | ICD-10-CM | POA: Diagnosis not present

## 2023-12-22 LAB — CBC WITH DIFFERENTIAL/PLATELET
Abs Immature Granulocytes: 0.06 10*3/uL (ref 0.00–0.07)
Basophils Absolute: 0 10*3/uL (ref 0.0–0.1)
Basophils Relative: 0 %
Eosinophils Absolute: 0.1 10*3/uL (ref 0.0–0.5)
Eosinophils Relative: 1 %
HCT: 36.2 % (ref 36.0–46.0)
Hemoglobin: 11.4 g/dL — ABNORMAL LOW (ref 12.0–15.0)
Immature Granulocytes: 1 %
Lymphocytes Relative: 15 %
Lymphs Abs: 1.7 10*3/uL (ref 0.7–4.0)
MCH: 27.9 pg (ref 26.0–34.0)
MCHC: 31.5 g/dL (ref 30.0–36.0)
MCV: 88.7 fL (ref 80.0–100.0)
Monocytes Absolute: 0.6 10*3/uL (ref 0.1–1.0)
Monocytes Relative: 5 %
Neutro Abs: 8.8 10*3/uL — ABNORMAL HIGH (ref 1.7–7.7)
Neutrophils Relative %: 78 %
Platelets: 366 10*3/uL (ref 150–400)
RBC: 4.08 MIL/uL (ref 3.87–5.11)
RDW: 16.5 % — ABNORMAL HIGH (ref 11.5–15.5)
WBC: 11.3 10*3/uL — ABNORMAL HIGH (ref 4.0–10.5)
nRBC: 0 % (ref 0.0–0.2)

## 2023-12-22 LAB — MAGNESIUM: Magnesium: 2 mg/dL (ref 1.7–2.4)

## 2023-12-22 LAB — BASIC METABOLIC PANEL WITH GFR
Anion gap: 4 — ABNORMAL LOW (ref 5–15)
BUN: 19 mg/dL (ref 8–23)
CO2: 18 mmol/L — ABNORMAL LOW (ref 22–32)
Calcium: 7.4 mg/dL — ABNORMAL LOW (ref 8.9–10.3)
Chloride: 118 mmol/L — ABNORMAL HIGH (ref 98–111)
Creatinine, Ser: 0.93 mg/dL (ref 0.44–1.00)
GFR, Estimated: >60 mL/min (ref >60)
Glucose, Bld: 85 mg/dL (ref 70–99)
Potassium: 3.6 mmol/L (ref 3.5–5.1)
Sodium: 140 mmol/L (ref 135–145)

## 2023-12-22 MED ORDER — NITROGLYCERIN 0.4 MG SL SUBL: 0.4000 mg | SUBLINGUAL_TABLET | SUBLINGUAL | 0 refills | Status: AC | PRN

## 2023-12-22 MED ORDER — POTASSIUM CHLORIDE CRYS ER 20 MEQ PO TBCR
40.0000 meq | EXTENDED_RELEASE_TABLET | Freq: Once | ORAL | Status: AC
  Administered 2023-12-22: 40 meq via ORAL
  Filled 2023-12-22: qty 2

## 2023-12-22 MED ORDER — CIPROFLOXACIN HCL 500 MG PO TABS
750.0000 mg | ORAL_TABLET | Freq: Once | ORAL | Status: AC
  Administered 2023-12-22: 750 mg via ORAL
  Filled 2023-12-22: qty 2

## 2023-12-22 MED ORDER — LEVOCETIRIZINE DIHYDROCHLORIDE 5 MG PO TABS: 5.0000 mg | ORAL_TABLET | Freq: Every day | ORAL | Status: DC | PRN

## 2023-12-22 MED ORDER — VITAMIN B-12 1000 MCG PO TABS: 1000.0000 ug | ORAL_TABLET | Freq: Every day | ORAL | 0 refills | Status: AC

## 2023-12-22 MED ORDER — SODIUM BICARBONATE 650 MG PO TABS
650.0000 mg | ORAL_TABLET | Freq: Three times a day (TID) | ORAL | 0 refills | Status: AC
  Filled 2023-12-22: qty 30, 10d supply, fill #0

## 2023-12-22 NOTE — Plan of Care (Signed)

## 2023-12-22 NOTE — TOC Transition Note (Signed)
 Transition of Care [facility]) - Discharge Note   Patient Details  Name: Kaitlyn Adams MRN: [id number] Date of Birth: 12/17/1950  [facility] CM/SW Contact:  Tessie Fila, RN Phone Number: 12/22/2023, 10:07 AM   Clinical Narrative:    Pt discharging home with spouse. Pt will receive HH PT/OT through [facility]. HH orders are in place. Information on AVS for follow-up with agency. Pt spouse to transport pt home at discharge. No further TOC needs at this time.   Final next level of care: Home w Home Health Services Barriers to Discharge: No Barriers Identified   Patient Goals and CMS Choice Patient states their goals for this hospitalization and ongoing recovery are:: To return home with home health PT/OT CMS Medicare.gov Compare Post Acute Care list provided to:: Other (Comment Required) (NA) Choice offered to / list presented to : NA [place] [facility]provided to:: Parent NA    Discharge Placement                       Discharge Plan and Services Additional resources added to the After Visit Summary for   In-house Referral: NA Discharge Planning Services: CM Consult Post Acute Care Choice: Skilled Nursing Facility          DME Arranged: N/A DME Agency: NA       HH Arranged: PT, OT HH Agency: [facility] Date [facility] Agency Contacted: 12/20/23 Time HH Agency Contacted: [id number] Representative spoke with at [facility] Agency: Cindie with [place]  Social Drivers of Health (SDOH) Interventions SDOH Screenings   Food Insecurity: No Food Insecurity (12/18/2023)  Housing: Low Risk  (12/18/2023)  Transportation Needs: No Transportation Needs (12/18/2023)  Utilities: Not At Risk (12/18/2023)  Alcohol  Screen: Low Risk  (09/20/2023)  Depression (PHQ2-9): Low Risk  (10/20/2023)  Financial Resource Strain: Low Risk  (09/20/2023)  Physical Activity: Insufficiently Active (09/20/2023)  Social Connections: Moderately Isolated  (12/18/2023)  Stress: No Stress Concern Present (09/20/2023)  Tobacco Use: Low Risk  (12/18/2023)  Health Literacy: Adequate Health Literacy (10/20/2023)     Readmission Risk Interventions    12/19/2023    2:30 PM  Readmission Risk Prevention Plan  Transportation Screening Complete  PCP or Specialist Appt within 5-7 Days Complete  Home Care Screening Complete  Medication Review (RN CM) Complete

## 2023-12-22 NOTE — Plan of Care (Signed)

## 2023-12-22 NOTE — Progress Notes (Signed)
 Progress Note  Patient Name: Kaitlyn Adams Date of Encounter: 12/22/2023  [facility] Cardiologist: Peter Swaziland, MD   Patient Profile     Subjective   No chest pain or SOB. K+ improved to 3.6   Inpatient Medications    Scheduled Meds:  aspirin   325 mg Oral Daily   calcium  carbonate  1 tablet Oral TID WC   cyanocobalamin   1,000 mcg Subcutaneous Q1200   donepezil   5 mg Oral QHS   enoxaparin  (LOVENOX ) injection  30 mg Subcutaneous Q24H   fluticasone  furoate-vilanterol  1 puff Inhalation Daily   hydrocortisone   10 mg Oral BID   levothyroxine   25 mcg Oral Q0600   loratadine   10 mg Oral Daily   pantoprazole   40 mg Oral BID AC   Continuous Infusions:  sodium bicarbonate  150 mEq in dextrose  5 % 1,150 mL infusion 100 mL/hr at 12/20/23 0855   PRN Meds: acetaminophen  **OR** acetaminophen , albuterol , ALPRAZolam , nitroGLYCERIN , ondansetron  **OR** ondansetron  (ZOFRAN ) IV, polyethylene glycol   Vital Signs    Vitals:   12/21/23 0730 12/21/23 1429 12/21/23 2010 12/22/23 0525  BP:  (!) 145/72 122/75 122/71  Pulse:  67 73 70  Resp:  16 17 18   Temp:  97.9 F (36.6 C) 97.7 F (36.5 C) 97.8 F (36.6 C)  TempSrc:      SpO2: 100% 100% 100% 100%  Weight:       No intake or output data in the 24 hours ending 12/22/23 0723     12/21/2023    5:00 AM 12/20/2023    5:00 AM 12/19/2023    5:00 AM  Last 3 Weights  Weight (lbs) 106 lb 11.2 oz 114 lb 4.7 oz 113 lb 8 oz  Weight (kg) 48.4 kg 51.844 kg 51.483 kg      Telemetry    NSR- Personally Reviewed  ECG    NSR with RBBB- Personally Reviewed  Physical Exam   GEN: Well nourished, well developed in no acute distress HEENT: Normal NECK: No JVD; No carotid bruits LYMPHATICS: No lymphadenopathy CARDIAC:RRR, no murmurs, rubs, gallops RESPIRATORY:  Clear to auscultation without rales, wheezing or rhonchi  ABDOMEN: Soft, non-tender, non-distended MUSCULOSKELETAL:  No edema; No deformity  SKIN: Warm and  dry NEUROLOGIC:  Alert and oriented x 3 PSYCHIATRIC:  Normal affect  Labs    High Sensitivity Troponin:   Recent Labs  Lab 12/18/23 1211 12/18/23 1515 12/19/23 1117 12/19/23 1243  TROPONINIHS 214* 184* 138* 139*      Chemistry Recent Labs  Lab 12/18/23 1211 12/18/23 1849 12/20/23 0525 12/21/23 0600 12/22/23 0546  NA 137   < > 137 137 140  K <2.0*   < > 3.0* 3.0* 3.6  CL 113*   < > 120* 116* 118*  CO2 15*   < > 12* 14* 18*  GLUCOSE 100*   < > 80 84 85  BUN 27*   < > 21 14 19   CREATININE 1.64*   < > 1.32* 1.09* 0.93  CALCIUM  8.8*   < > 6.9* 6.7* 7.4*  PROT 7.5  --   --  5.6*  --   ALBUMIN 3.3*  --   --  2.5*  --   AST 175*  --   --  175*  --   ALT 67*  --   --  79*  --   ALKPHOS 59  --   --  49  --   BILITOT 0.5  --   --  0.6  --  GFRNONAA 33*   < > 42* 53* >60  ANIONGAP 9   < > 5 7 4*   < > = values in this interval not displayed.     Hematology Recent Labs  Lab 12/19/23 0521 12/21/23 0600 12/22/23 0546  WBC 26.3* 11.1* 11.3*  RBC 3.87  3.93 3.79* 4.08  HGB 11.0* 10.7* 11.4*  HCT 33.4* 32.6* 36.2  MCV 86.3 86.0 88.7  MCH 28.4 28.2 27.9  MCHC 32.9 32.8 31.5  RDW 16.5* 16.4* 16.5*  PLT 404* 325 366    BNPNo results for input(s): "BNP", "PROBNP" in the last 168 hours.   DDimer No results for input(s): "DDIMER" in the last 168 hours.   Radiology    ECHOCARDIOGRAM COMPLETE Result Date: 12/20/2023    ECHOCARDIOGRAM REPORT   Patient Name:   Kaitlyn Adams Date of Exam: 12/20/2023 Medical Rec #:  [id number]          Height:       63.0 in Accession #:    [id number]         Weight:       114.3 lb Date of Birth:  [birth year]          BSA:          1.524 m Patient Age:    73 years           BP:           112/65 mmHg Patient Gender: F                  HR:           75 bpm. Exam Location:  Inpatient Procedure: 2D Echo, Cardiac Doppler, Color Doppler, 3D Echo and Strain Analysis            (Both Spectral and Color Flow Doppler were utilized during             procedure). Indications:     Chest pain  History:         Patient has prior history of Echocardiogram examinations, most                  recent 07/07/2023. CAD, Arrythmias:Atrial Fibrillation; Risk                  Factors:Dyslipidemia, Hypertension and Family History of                  Coronary Artery Disease.  Sonographer:     Juanita Shaw Referring Phys:  [id number] Debria Fang Diagnosing Phys: Maudine Sos MD IMPRESSIONS  1. Left ventricular ejection fraction by 3D volume is 67 %. The left ventricle has normal function. The left ventricle has no regional wall motion abnormalities. Left ventricular diastolic parameters were normal. The average left ventricular global longitudinal strain is -18.6 %. The global longitudinal strain is normal.  2. Right ventricular systolic function is normal. The right ventricular size is normal. There is normal pulmonary artery systolic pressure.  3. The mitral valve is normal in structure. Trivial mitral valve regurgitation. No evidence of mitral stenosis.  4. The aortic valve is normal in structure. Aortic valve regurgitation is not visualized. No aortic stenosis is present.  5. The inferior vena cava is normal in size with greater than 50% respiratory variability, suggesting right atrial pressure of 3 mmHg. FINDINGS  Left Ventricle: Left ventricular ejection fraction by 3D volume is 67 %. The left ventricle has normal function. The left ventricle has no  regional wall motion abnormalities. The average left ventricular global longitudinal strain is -18.6 %. Strain was  performed and the global longitudinal strain is normal. The left ventricular internal cavity size was normal in size. There is no left ventricular hypertrophy. Left ventricular diastolic parameters were normal. Indeterminate filling pressures. Right Ventricle: The right ventricular size is normal. No increase in right ventricular wall thickness. Right ventricular systolic function is normal. There is  normal pulmonary artery systolic pressure. The tricuspid regurgitant velocity is 2.23 m/s, and  with an assumed right atrial pressure of 3 mmHg, the estimated right ventricular systolic pressure is 22.9 mmHg. Left Atrium: Left atrial size was normal in size. Right Atrium: Right atrial size was normal in size. Pericardium: There is no evidence of pericardial effusion. Mitral Valve: The mitral valve is normal in structure. Trivial mitral valve regurgitation. No evidence of mitral valve stenosis. MV peak gradient, 4.2 mmHg. The mean mitral valve gradient is 1.0 mmHg. Tricuspid Valve: The tricuspid valve is normal in structure. Tricuspid valve regurgitation is trivial. No evidence of tricuspid stenosis. Aortic Valve: The aortic valve is normal in structure. Aortic valve regurgitation is not visualized. No aortic stenosis is present. Aortic valve mean gradient measures 4.0 mmHg. Aortic valve peak gradient measures 8.2 mmHg. Aortic valve area, by VTI measures 2.05 cm. Pulmonic Valve: The pulmonic valve was normal in structure. Pulmonic valve regurgitation is not visualized. No evidence of pulmonic stenosis. Aorta: The aortic root is normal in size and structure. Venous: The inferior vena cava is normal in size with greater than 50% respiratory variability, suggesting right atrial pressure of 3 mmHg. IAS/Shunts: No atrial level shunt detected by color flow Doppler. Additional Comments: 3D was performed not requiring image post processing on an independent workstation and was normal.  LEFT VENTRICLE PLAX 2D LVIDd:         3.30 cm         Diastology LVIDs:         2.00 cm         LV e' medial:    9.03 cm/s LV PW:         0.90 cm         LV E/e' medial:  10.2 LV IVS:        0.70 cm         LV e' lateral:   10.00 cm/s LVOT diam:     1.70 cm         LV E/e' lateral: 9.2 LV SV:         52 LV SV Index:   34              2D Longitudinal LVOT Area:     2.27 cm        Strain                                2D Strain GLS   -24.2 %                                 (A4C): LV Volumes (MOD)               2D Strain GLS   -11.7 % LV vol d, MOD    61.0 ml       (A3C): A2C:  2D Strain GLS   -20.0 % LV vol d, MOD    74.7 ml       (A2C): A4C:                           2D Strain GLS   -18.6 % LV vol s, MOD    19.9 ml       Avg: A2C: LV vol s, MOD    23.0 ml       3D Volume EF A4C:                           LV 3D EF:    Left LV SV MOD A2C:   41.1 ml                    ventricul LV SV MOD A4C:   74.7 ml                    ar LV SV MOD BP:    44.8 ml                    ejection                                             fraction                                             by 3D                                             volume is                                             67 %.                                 3D Volume EF:                                3D EF:        67 % RIGHT VENTRICLE             IVC RV Basal diam:  3.50 cm     IVC diam: 1.30 cm RV Mid diam:    2.00 cm RV S prime:     11.60 cm/s TAPSE (M-mode): 2.4 cm LEFT ATRIUM             Index        RIGHT ATRIUM          Index LA diam:        2.40 cm 1.57 cm/m   RA Area:     8.84 cm LA Vol (A2C):   24.6 ml 16.14 ml/m  RA Volume:   19.20 ml 12.60 ml/m LA Vol (A4C):  15.6 ml 10.23 ml/m LA Biplane Vol: 20.1 ml 13.19 ml/m  AORTIC VALVE                    PULMONIC VALVE AV Area (Vmax):    2.19 cm     PV Vmax:       1.10 m/s AV Area (Vmean):   1.94 cm     PV Peak grad:  4.8 mmHg AV Area (VTI):     2.05 cm AV Vmax:           143.00 cm/s AV Vmean:          94.900 cm/s AV VTI:            0.254 m AV Peak Grad:      8.2 mmHg AV Mean Grad:      4.0 mmHg LVOT Vmax:         138.00 cm/s LVOT Vmean:        81.000 cm/s LVOT VTI:          0.229 m LVOT/AV VTI ratio: 0.90  AORTA Ao Root diam: 2.70 cm Ao Asc diam:  3.20 cm MITRAL VALVE               TRICUSPID VALVE MV Area (PHT): 3.23 cm    TR Peak grad:   19.9 mmHg MV Area VTI:   2.20 cm    TR Vmax:        223.00 cm/s MV Peak grad:   4.2 mmHg MV Mean grad:  1.0 mmHg    SHUNTS MV Vmax:       1.03 m/s    Systemic VTI:  0.23 m MV Vmean:      55.2 cm/s   Systemic Diam: 1.70 cm MV Decel Time: 235 msec MV E velocity: 92.10 cm/s MV A velocity: 78.00 cm/s MV E/A ratio:  1.18 Maudine Sos MD Electronically signed by Maudine Sos MD Signature Date/Time: 12/20/2023/6:45:13 PM    Final (Updated)     Patient Profile     73 y.o. female with a hx of PAT, PAF, chronic right bundle branch block, hypertension, hyperlipidemia, hypothyroidism s/p thyroidectomy, Addison's disease on Florinef  and hydrocortisone , asthma, GERD, fibromyalgia, nonobstructive CAD by left heart cath, history of syncope, orthostatic hypotension who is being seen 12/19/2023 for the evaluation of chest pain at the request of Dr. Versa Gore.   Assessment & Plan      Chest pain Elevated troponin ASCAD HLD Her chest pain started a few days back prior to her nausea vomiting diaphoresis.  At that time she had achiness throughout her body especially in her muscles in her legs which likely was probably due to a viral syndrome.  Pain in her chest was reproducible with palpation both at that time and today on exam.  She said it also hurt some when she took a deep breath then High-sensitivity troponin mildly elevated with flat trend not consistent with ACS and most consistent with demand ischemia in the setting of profound electrolyte abnormalities Echo with EF 67%, normal strain, normal RV, trivial MR Although her EKG is markedly abnormal this is in the setting of severe hypokalemia and ST changes are likely related to metabolic derangements as well as digoxin  effect>> dig has been stopped due to sinus pauses Repeat EKG yesterday with K+ 3 much improved with RBBB and marked ST changes have significantly improved She had a cath in 2016 showing nonobstructive CAD Continue atorvastatin  20 mg daily  LDL at goal at  67 Recommend outpt coronary CTA or Stress PET CT given her  minimally elevated troponin although likely related to demand ischemia>>Will defer to Dr. Swaziland outpt   PAF/PAT Right bundle branch block Transient sinus pauses She has only had 1 episode of PAF and that was in 2016 in the setting of chest pain and has worn multiple heart monitor showing only PAT Initially treated with Xarelto  in 2016 but subsequently stopped due to a history of GI bleeds and recurrent falls She was on digoxin  PTA but was noted to have sinus pauses on telemetry earlier in admission and dig was stopped and would not restart especially since she has normal LVF. No PAF or pauses on tele   Metabolic derangements Hypokalemia Adrenal insufficiency Recent gastroenteritis Recently had diffuse body aches followed by nausea/vomiting/diarrhea a few days ago Also had not been taking her cortisol for a while Question whether she actually had a viral syndrome or this was all due to noncompliance with her hydrocortisone  Potassium again low today at 3 despite getting multiple doses of potassium Mag 2 and K+ 3.6 today Per [facility]  [facility] will sign off.   Medication Recommendations:  atorvastatin  20mg  daily Other recommendations (labs, testing, etc):  none Follow up as an outpatient:  Dr. Swaziland or extender in 2 weeks     For questions or updates, please contact [facility] Please consult [email or web address] for contact info under        Signed, Gaylyn Keas, MD  12/22/2023, 7:23 AM

## 2023-12-22 NOTE — Progress Notes (Signed)
 TOC med in a secure bag delivered to bedside by this RN- handed to pt. Ride enroute

## 2023-12-22 NOTE — Discharge Summary (Signed)
 Physician Discharge Summary  Kaitlyn Adams ZOX:[id number] DOB: [birth year] DOA: 12/18/2023  PCP: Jess Morita, MD  Admit date: 12/18/2023 Discharge date: 12/22/2023  Admitted From: Home Disposition: Home  Recommendations for Outpatient Follow-up:  Follow up with PCP in 1 week with repeat CBC/BMP Outpatient follow-up with cardiology. Follow up in ED if symptoms worsen or new appear   Home Health: No Equipment/Devices: None  Discharge Condition: Stable CODE STATUS: Full Diet recommendation: Heart healthy  Brief/Interim Summary: 73 y.o. female past medical history of adrenal insufficiency on steroids, paroxysmal atrial fibrillation on anticoagulation, chronic kidney disease stage III presented with generalized weakness, poor appetite and unintentional weight loss in the setting of noncompliance with her steroids for the last 2 to 3 days prior to presentation. She was admitted with generalized weakness/severe hypokalemia/nausea and vomiting along with AKI. She was treated with IV fluids and IV steroids and subsequently switched to oral hydrocortisone . Cardiology consulted for intermittent chest pain.  Echo showed EF of 67%.  Cardiology recommended outpatient follow-up with cardiology.  Patient's condition has subsequently improved with improving oral intake, nausea and potassium.  She feels much better and feels okay to go home today.  She will be discharged home today with outpatient follow-up with PCP and cardiology.  Discharge Diagnoses:   Severe hypokalemia - Much improved.  Potassium 3.6 this morning.  Continue replacement as an outpatient.  Outpatient follow-up of BMP.   Nausea and vomiting - Probably from adrenal insufficiency along with possibility of viral infection - Improving.  Tolerating diet.  Has not needed antiemetics in the last few days during the hospitalization.    Diarrhea -Improving.  Stool GI PCR positive for norovirus and enterotoxigenic E. coli.  Will  give 1 dose of ciprofloxacin  750 mg orally prior to discharge today.    Intermittent chest pain Positive troponins -Currently chest pain-free.  Echo showed EF of 67%.  Cardiology recommended outpatient follow-up with cardiology.   AKI on CKD stage IIIa -baseline creatinine of 1.2.  Creatinine improving to 0.93 this morning.  DC IV fluids   Acute metabolic acidosis -Bicarb improving to 18 today.  Currently on bicarb drip.  Discharge home on oral sodium bicarbonate  tablets 650 mg 3 times a day for 10 days.  Outpatient follow-up of BMP within a week by PCP.    Adrenal insufficiency - Missed several doses of her steroids at home.  Treated with IV Solu-Medrol  on presentation.  Currently on oral hydrocortisone .   Orthostatic hypotension - Likely due to hypovolemia.  Treated with IV fluids and improved.     Leukocytosis - Possibly reactive.  Mild.  No signs of infection.   Cultures negative so far.   Vitamin B12 deficiency -Vitamin B12 201.  Received parenteral supplementation during the hospitalization.  Switch to oral supplementation on discharge.   Hypothyroidism--continue levothyroxine    GERD - Patient currently completed PPI treatment as an outpatient.  Outpatient follow-up with PCP regarding need to continue the same or not.   Thrombocytosis - Resolved   Hypocalcemia - Improving.  Outpatient follow-up.   Elevated LFTs -questionable cause.  Still remain mildly elevated.  Can follow-up as an outpatient.  Statin to remain on hold till then.  No   Paroxysmal A-fib - Currently rate controlled.  Outpatient follow-up   Hyperlipidemia - Hold statin due to elevated LFTs   Physical deconditioning - PT now recommending home health PT.     Memory impairment - Continue donepezil    Discharge Instructions  Discharge Instructions  Diet general   Complete by: As directed    Increase activity slowly   Complete by: As directed       Allergies as of 12/22/2023        Reactions   Fenofibrate  Other (See Comments)   GI side effects   Erythromycin Nausea And Vomiting   REACTION: nausea and vomiting   Guaifenesin  Er Palpitations   Sulfonamide Derivatives Nausea And Vomiting   REACTION: nausea and vomiting        Medication List     STOP taking these medications    atorvastatin  20 MG tablet Commonly known as: LIPITOR   digoxin  0.125 MG tablet Commonly known as: LANOXIN    furosemide  20 MG tablet Commonly known as: LASIX        TAKE these medications    albuterol  108 (90 Base) MCG/ACT inhaler Commonly known as: VENTOLIN  HFA INHALE 2 PUFFS INTO THE LUNGS EVERY 6 HOURS AS NEEDED FOR WHEEZING OR SHORTNESS OF BREATH   amitriptyline  50 MG tablet Commonly known as: ELAVIL  TAKE 1 TABLET BY MOUTH AT BEDTIME   cholecalciferol 25 MCG (1000 UNIT) tablet Commonly known as: VITAMIN D3 Take 1,000 Units by mouth daily.   cyanocobalamin  1000 MCG tablet Commonly known as: VITAMIN B12 Take 1 tablet (1,000 mcg total) by mouth daily.   diphenhydramine -acetaminophen  25-500 MG Tabs tablet Commonly known as: TYLENOL  PM Take 1 tablet by mouth at bedtime as needed (pain).   donepezil  5 MG tablet Commonly known as: ARICEPT  TAKE 1 TABLET(5 MG) BY MOUTH AT BEDTIME   fluticasone -salmeterol 250-50 MCG/ACT Aepb Commonly known as: Advair Diskus Inhale 1 puff into the lungs in the morning and at bedtime.   hydrocortisone  10 MG tablet Commonly known as: CORTEF  Take 10 mg by mouth 2 (two) times daily. Patient reports taking BID 09/07/2022   hydroxypropyl methylcellulose / hypromellose 2.5 % ophthalmic solution Commonly known as: ISOPTO TEARS / GONIOVISC Place 2 drops into both eyes daily as needed for dry eyes.   IRON PO Take 1 tablet by mouth daily.   levocetirizine 5 MG tablet Commonly known as: XYZAL  Take 1 tablet (5 mg total) by mouth daily as needed for allergies.   levothyroxine  25 MCG tablet Commonly known as: SYNTHROID  Take 25 mcg by mouth  every morning.   nitroGLYCERIN  0.4 MG SL tablet Commonly known as: NITROSTAT  Place 1 tablet (0.4 mg total) under the tongue every 5 (five) minutes as needed for chest pain.   potassium chloride  SA 20 MEQ tablet Commonly known as: KLOR-CON  M Take 2 tablets (40 mEq total) by mouth daily. What changed: how much to take   sodium bicarbonate  650 MG tablet Take 1 tablet (650 mg total) by mouth 3 (three) times daily for 10 days.        Follow-up Information     [facility] Follow up.   Specialty: Home Health Services Why: After you discharge from the hospital someone will contact you to start your Home Health PT and OT services. Contact information: [address] [place] [address] [phone]         Jess Morita, MD. Schedule an appointment as soon as possible for a visit in 1 week(s).   Specialty: Family Medicine Why: with repeat cbc/bmp Contact information: [address] [place] [address] [phone]                Allergies  Allergen Reactions   Fenofibrate  Other (See Comments)  GI side effects    Erythromycin Nausea And Vomiting    REACTION: nausea and vomiting   Guaifenesin  Er Palpitations   Sulfonamide Derivatives Nausea And Vomiting    REACTION: nausea and vomiting    Consultations: Cardiology  Procedures/Studies: ECHOCARDIOGRAM COMPLETE Result Date: 12/20/2023    ECHOCARDIOGRAM REPORT   Patient Name:   LEVONIA KNOBLOCK Date of Exam: 12/20/2023 Medical Rec #:  [id number]          Height:       63.0 in Accession #:    [id number]         Weight:       114.3 lb Date of Birth:  06/11/1951          BSA:          1.524 m Patient Age:    73 years           BP:           112/65 mmHg Patient Gender: F                  HR:           75 bpm. Exam Location:  Inpatient Procedure: 2D Echo, Cardiac Doppler, Color Doppler, 3D Echo and Strain Analysis            (Both Spectral and Color Flow Doppler were  utilized during            procedure). Indications:     Chest pain  History:         Patient has prior history of Echocardiogram examinations, most                  recent 07/07/2023. CAD, Arrythmias:Atrial Fibrillation; Risk                  Factors:Dyslipidemia, Hypertension and Family History of                  Coronary Artery Disease.  Sonographer:     Juanita Shaw Referring Phys:  [id number] Debria Fang Diagnosing Phys: Maudine Sos MD IMPRESSIONS  1. Left ventricular ejection fraction by 3D volume is 67 %. The left ventricle has normal function. The left ventricle has no regional wall motion abnormalities. Left ventricular diastolic parameters were normal. The average left ventricular global longitudinal strain is -18.6 %. The global longitudinal strain is normal.  2. Right ventricular systolic function is normal. The right ventricular size is normal. There is normal pulmonary artery systolic pressure.  3. The mitral valve is normal in structure. Trivial mitral valve regurgitation. No evidence of mitral stenosis.  4. The aortic valve is normal in structure. Aortic valve regurgitation is not visualized. No aortic stenosis is present.  5. The inferior vena cava is normal in size with greater than 50% respiratory variability, suggesting right atrial pressure of 3 mmHg. FINDINGS  Left Ventricle: Left ventricular ejection fraction by 3D volume is 67 %. The left ventricle has normal function. The left ventricle has no regional wall motion abnormalities. The average left ventricular global longitudinal strain is -18.6 %. Strain was  performed and the global longitudinal strain is normal. The left ventricular internal cavity size was normal in size. There is no left ventricular hypertrophy. Left ventricular diastolic parameters were normal. Indeterminate filling pressures. Right Ventricle: The right ventricular size is normal. No increase in right ventricular wall thickness. Right ventricular systolic  function is normal. There is normal pulmonary artery systolic pressure. The tricuspid regurgitant  velocity is 2.23 m/s, and  with an assumed right atrial pressure of 3 mmHg, the estimated right ventricular systolic pressure is 22.9 mmHg. Left Atrium: Left atrial size was normal in size. Right Atrium: Right atrial size was normal in size. Pericardium: There is no evidence of pericardial effusion. Mitral Valve: The mitral valve is normal in structure. Trivial mitral valve regurgitation. No evidence of mitral valve stenosis. MV peak gradient, 4.2 mmHg. The mean mitral valve gradient is 1.0 mmHg. Tricuspid Valve: The tricuspid valve is normal in structure. Tricuspid valve regurgitation is trivial. No evidence of tricuspid stenosis. Aortic Valve: The aortic valve is normal in structure. Aortic valve regurgitation is not visualized. No aortic stenosis is present. Aortic valve mean gradient measures 4.0 mmHg. Aortic valve peak gradient measures 8.2 mmHg. Aortic valve area, by VTI measures 2.05 cm. Pulmonic Valve: The pulmonic valve was normal in structure. Pulmonic valve regurgitation is not visualized. No evidence of pulmonic stenosis. Aorta: The aortic root is normal in size and structure. Venous: The inferior vena cava is normal in size with greater than 50% respiratory variability, suggesting right atrial pressure of 3 mmHg. IAS/Shunts: No atrial level shunt detected by color flow Doppler. Additional Comments: 3D was performed not requiring image post processing on an independent workstation and was normal.  LEFT VENTRICLE PLAX 2D LVIDd:         3.30 cm         Diastology LVIDs:         2.00 cm         LV e' medial:    9.03 cm/s LV PW:         0.90 cm         LV E/e' medial:  10.2 LV IVS:        0.70 cm         LV e' lateral:   10.00 cm/s LVOT diam:     1.70 cm         LV E/e' lateral: 9.2 LV SV:         52 LV SV Index:   34              2D Longitudinal LVOT Area:     2.27 cm        Strain                                 2D Strain GLS   -24.2 %                                (A4C): LV Volumes (MOD)               2D Strain GLS   -11.7 % LV vol d, MOD    61.0 ml       (A3C): A2C:                           2D Strain GLS   -20.0 % LV vol d, MOD    74.7 ml       (A2C): A4C:                           2D Strain GLS   -18.6 % LV vol s, MOD    19.9 ml  Avg: A2C: LV vol s, MOD    23.0 ml       3D Volume EF A4C:                           LV 3D EF:    Left LV SV MOD A2C:   41.1 ml                    ventricul LV SV MOD A4C:   74.7 ml                    ar LV SV MOD BP:    44.8 ml                    ejection                                             fraction                                             by 3D                                             volume is                                             67 %.                                 3D Volume EF:                                3D EF:        67 % RIGHT VENTRICLE             IVC RV Basal diam:  3.50 cm     IVC diam: 1.30 cm RV Mid diam:    2.00 cm RV S prime:     11.60 cm/s TAPSE (M-mode): 2.4 cm LEFT ATRIUM             Index        RIGHT ATRIUM          Index LA diam:        2.40 cm 1.57 cm/m   RA Area:     8.84 cm LA Vol (A2C):   24.6 ml 16.14 ml/m  RA Volume:   19.20 ml 12.60 ml/m LA Vol (A4C):   15.6 ml 10.23 ml/m LA Biplane Vol: 20.1 ml 13.19 ml/m  AORTIC VALVE                    PULMONIC VALVE AV Area (Vmax):    2.19 cm     PV Vmax:       1.10 m/s AV Area (Vmean):   1.94 cm     PV Peak grad:  4.8 mmHg AV Area (VTI):     2.05 cm AV Vmax:           143.00 cm/s AV Vmean:          94.900 cm/s AV VTI:            0.254 m AV Peak Grad:      8.2 mmHg AV Mean Grad:      4.0 mmHg LVOT Vmax:         138.00 cm/s LVOT Vmean:        81.000 cm/s LVOT VTI:          0.229 m LVOT/AV VTI ratio: 0.90  AORTA Ao Root diam: 2.70 cm Ao Asc diam:  3.20 cm MITRAL VALVE               TRICUSPID VALVE MV Area (PHT): 3.23 cm    TR Peak grad:   19.9 mmHg MV Area VTI:   2.20 cm    TR Vmax:         223.00 cm/s MV Peak grad:  4.2 mmHg MV Mean grad:  1.0 mmHg    SHUNTS MV Vmax:       1.03 m/s    Systemic VTI:  0.23 m MV Vmean:      55.2 cm/s   Systemic Diam: 1.70 cm MV Decel Time: 235 msec MV E velocity: 92.10 cm/s MV A velocity: 78.00 cm/s MV E/A ratio:  1.18 Maudine Sos MD Electronically signed by Maudine Sos MD Signature Date/Time: 12/20/2023/6:45:13 PM    Final (Updated)    CT Cervical Spine Wo Contrast Result Date: 12/18/2023 CLINICAL DATA:  Weakness, pain, cervical radiculopathy EXAM: CT CERVICAL SPINE WITHOUT CONTRAST TECHNIQUE: Multidetector CT imaging of the cervical spine was performed without intravenous contrast. Multiplanar CT image reconstructions were also generated. RADIATION DOSE REDUCTION: This exam was performed according to the departmental dose-optimization program which includes automated exposure control, adjustment of the mA and/or kV according to patient size and/or use of iterative reconstruction technique. COMPARISON:  06/19/2022 FINDINGS: Alignment: Alignment is grossly anatomic. Skull base and vertebrae: No acute fracture. No primary bone lesion or focal pathologic process. Soft tissues and spinal canal: No prevertebral fluid or swelling. No visible canal hematoma. Disc levels: Stable hypertrophic changes at the C1-C2 interface. There is multilevel spondylosis most pronounced at C4-5, C5-6, and C6-7. Multilevel facet hypertrophy greatest at the C2-3, C3-4, and C4-5 levels on the right. No significant change since prior exam. Upper chest: Airway is patent. Visualized portions of the lung apices are clear. Other: Reconstructed images demonstrate no additional findings. IMPRESSION: 1. No acute cervical spine fracture. 2. Stable multilevel spondylosis and facet hypertrophy as above. Electronically Signed   By: Bobbye Burrow M.D.   On: 12/18/2023 16:49   CT Head Wo Contrast Result Date: 12/18/2023 CLINICAL DATA:  Weakness, orthostatic hypotension, vomiting EXAM: CT HEAD  WITHOUT CONTRAST TECHNIQUE: Contiguous axial images were obtained from the base of the skull through the vertex without intravenous contrast. RADIATION DOSE REDUCTION: This exam was performed according to the departmental dose-optimization program which includes automated exposure control, adjustment of the mA and/or kV according to patient size and/or use of iterative reconstruction technique. COMPARISON:  06/19/2022 FINDINGS: Brain: No acute infarct or hemorrhage. Lateral ventricles and midline structures are unremarkable. No acute extra-axial fluid collections. No mass effect. Vascular: No hyperdense vessel or unexpected calcification. Skull: Normal. Negative for fracture or focal lesion. Sinuses/Orbits: No acute finding. Other: None. IMPRESSION: 1. No acute intracranial process.  Electronically Signed   By: Bobbye Burrow M.D.   On: 12/18/2023 16:46   CT CHEST ABDOMEN PELVIS WO CONTRAST Result Date: 12/18/2023 CLINICAL DATA:  Weakness, pain, unintended weight loss * Tracking Code: BO * EXAM: CT CHEST, ABDOMEN AND PELVIS WITHOUT CONTRAST CT THORACIC AND LUMBAR SPINE WITHOUT CONTRAST TECHNIQUE: Multidetector CT imaging of the chest, abdomen and pelvis was performed following the standard protocol without IV contrast. Multidetector CT imaging of the thoracic and lumbar spine was performed following the standard protocol without IV contrast. RADIATION DOSE REDUCTION: This exam was performed according to the departmental dose-optimization program which includes automated exposure control, adjustment of the mA and/or kV according to patient size and/or use of iterative reconstruction technique. COMPARISON:  Chest radiographs, 04/19/2023 FINDINGS: CT CHEST FINDINGS Cardiovascular: Aortic atherosclerosis. Normal heart size. Left and right coronary artery calcifications. No pericardial effusion. Mediastinum/Nodes: No enlarged mediastinal, hilar, or axillary lymph nodes. Surgical clips about the thyroid . Trachea, and  esophagus demonstrate no significant findings. Lungs/Pleura: Lungs are clear. No pleural effusion or pneumothorax. Musculoskeletal: No chest wall mass or suspicious osseous lesions identified. CT ABDOMEN PELVIS FINDINGS Hepatobiliary: No solid liver abnormality is seen. No gallstones, gallbladder wall thickening, or biliary dilatation. Pancreas: Unremarkable. No pancreatic ductal dilatation or surrounding inflammatory changes. Spleen: Normal in size without significant abnormality. Adrenals/Urinary Tract: Adrenal glands are unremarkable. Kidneys are normal, without renal calculi, solid lesion, or hydronephrosis. Bladder is unremarkable. Stomach/Bowel: Stomach is within normal limits. Appendix appears normal. No evidence of bowel wall thickening, distention, or inflammatory changes. Vascular/Lymphatic: Aortic atherosclerosis. No enlarged abdominal or pelvic lymph nodes. Reproductive: Status post hysterectomy. Other: No abdominal wall hernia or abnormality. No ascites. Musculoskeletal: No acute osseous findings. CT THORACIC AND LUMBAR SPINE FINDINGS Alignment: Normal thoracic kyphosis. Normal lumbar lordosis. Vertebral bodies: No acute fracture. Superior endplate wedge deformity of L1 unchanged as compared to prior radiographs dated 04/19/2023. Disc spaces: Generally mild disc space height loss throughout the thoracic and lumbar spine, focally moderate at L5-S1. Mild anterior bridging osteophytosis throughout the thoracic spine in keeping with DISH. Paraspinous soft tissues: Unremarkable. IMPRESSION: 1. No noncontrast CT evidence of mass or lymphadenopathy in the chest, abdomen, or pelvis. 2. No acute fracture or dislocation of the thoracic or lumbar spine. Superior endplate wedge deformity of L1 unchanged as compared to prior radiographs dated 04/19/2023. 3. Coronary artery disease. Aortic Atherosclerosis (ICD10-I70.0). Electronically Signed   By: Fredricka Jenny M.D.   On: 12/18/2023 16:29   CT T-SPINE NO  CHARGE Result Date: 12/18/2023 CLINICAL DATA:  Weakness, pain, unintended weight loss * Tracking Code: BO * EXAM: CT CHEST, ABDOMEN AND PELVIS WITHOUT CONTRAST CT THORACIC AND LUMBAR SPINE WITHOUT CONTRAST TECHNIQUE: Multidetector CT imaging of the chest, abdomen and pelvis was performed following the standard protocol without IV contrast. Multidetector CT imaging of the thoracic and lumbar spine was performed following the standard protocol without IV contrast. RADIATION DOSE REDUCTION: This exam was performed according to the departmental dose-optimization program which includes automated exposure control, adjustment of the mA and/or kV according to patient size and/or use of iterative reconstruction technique. COMPARISON:  Chest radiographs, 04/19/2023 FINDINGS: CT CHEST FINDINGS Cardiovascular: Aortic atherosclerosis. Normal heart size. Left and right coronary artery calcifications. No pericardial effusion. Mediastinum/Nodes: No enlarged mediastinal, hilar, or axillary lymph nodes. Surgical clips about the thyroid . Trachea, and esophagus demonstrate no significant findings. Lungs/Pleura: Lungs are clear. No pleural effusion or pneumothorax. Musculoskeletal: No chest wall mass or suspicious osseous lesions identified. CT ABDOMEN PELVIS FINDINGS Hepatobiliary: No solid  liver abnormality is seen. No gallstones, gallbladder wall thickening, or biliary dilatation. Pancreas: Unremarkable. No pancreatic ductal dilatation or surrounding inflammatory changes. Spleen: Normal in size without significant abnormality. Adrenals/Urinary Tract: Adrenal glands are unremarkable. Kidneys are normal, without renal calculi, solid lesion, or hydronephrosis. Bladder is unremarkable. Stomach/Bowel: Stomach is within normal limits. Appendix appears normal. No evidence of bowel wall thickening, distention, or inflammatory changes. Vascular/Lymphatic: Aortic atherosclerosis. No enlarged abdominal or pelvic lymph nodes. Reproductive:  Status post hysterectomy. Other: No abdominal wall hernia or abnormality. No ascites. Musculoskeletal: No acute osseous findings. CT THORACIC AND LUMBAR SPINE FINDINGS Alignment: Normal thoracic kyphosis. Normal lumbar lordosis. Vertebral bodies: No acute fracture. Superior endplate wedge deformity of L1 unchanged as compared to prior radiographs dated 04/19/2023. Disc spaces: Generally mild disc space height loss throughout the thoracic and lumbar spine, focally moderate at L5-S1. Mild anterior bridging osteophytosis throughout the thoracic spine in keeping with DISH. Paraspinous soft tissues: Unremarkable. IMPRESSION: 1. No noncontrast CT evidence of mass or lymphadenopathy in the chest, abdomen, or pelvis. 2. No acute fracture or dislocation of the thoracic or lumbar spine. Superior endplate wedge deformity of L1 unchanged as compared to prior radiographs dated 04/19/2023. 3. Coronary artery disease. Aortic Atherosclerosis (ICD10-I70.0). Electronically Signed   By: Fredricka Jenny M.D.   On: 12/18/2023 16:29   CT L-SPINE NO CHARGE Result Date: 12/18/2023 CLINICAL DATA:  Weakness, pain, unintended weight loss * Tracking Code: BO * EXAM: CT CHEST, ABDOMEN AND PELVIS WITHOUT CONTRAST CT THORACIC AND LUMBAR SPINE WITHOUT CONTRAST TECHNIQUE: Multidetector CT imaging of the chest, abdomen and pelvis was performed following the standard protocol without IV contrast. Multidetector CT imaging of the thoracic and lumbar spine was performed following the standard protocol without IV contrast. RADIATION DOSE REDUCTION: This exam was performed according to the departmental dose-optimization program which includes automated exposure control, adjustment of the mA and/or kV according to patient size and/or use of iterative reconstruction technique. COMPARISON:  Chest radiographs, 04/19/2023 FINDINGS: CT CHEST FINDINGS Cardiovascular: Aortic atherosclerosis. Normal heart size. Left and right coronary artery calcifications. No  pericardial effusion. Mediastinum/Nodes: No enlarged mediastinal, hilar, or axillary lymph nodes. Surgical clips about the thyroid . Trachea, and esophagus demonstrate no significant findings. Lungs/Pleura: Lungs are clear. No pleural effusion or pneumothorax. Musculoskeletal: No chest wall mass or suspicious osseous lesions identified. CT ABDOMEN PELVIS FINDINGS Hepatobiliary: No solid liver abnormality is seen. No gallstones, gallbladder wall thickening, or biliary dilatation. Pancreas: Unremarkable. No pancreatic ductal dilatation or surrounding inflammatory changes. Spleen: Normal in size without significant abnormality. Adrenals/Urinary Tract: Adrenal glands are unremarkable. Kidneys are normal, without renal calculi, solid lesion, or hydronephrosis. Bladder is unremarkable. Stomach/Bowel: Stomach is within normal limits. Appendix appears normal. No evidence of bowel wall thickening, distention, or inflammatory changes. Vascular/Lymphatic: Aortic atherosclerosis. No enlarged abdominal or pelvic lymph nodes. Reproductive: Status post hysterectomy. Other: No abdominal wall hernia or abnormality. No ascites. Musculoskeletal: No acute osseous findings. CT THORACIC AND LUMBAR SPINE FINDINGS Alignment: Normal thoracic kyphosis. Normal lumbar lordosis. Vertebral bodies: No acute fracture. Superior endplate wedge deformity of L1 unchanged as compared to prior radiographs dated 04/19/2023. Disc spaces: Generally mild disc space height loss throughout the thoracic and lumbar spine, focally moderate at L5-S1. Mild anterior bridging osteophytosis throughout the thoracic spine in keeping with DISH. Paraspinous soft tissues: Unremarkable. IMPRESSION: 1. No noncontrast CT evidence of mass or lymphadenopathy in the chest, abdomen, or pelvis. 2. No acute fracture or dislocation of the thoracic or lumbar spine. Superior endplate wedge deformity of L1  unchanged as compared to prior radiographs dated 04/19/2023. 3. Coronary artery  disease. Aortic Atherosclerosis (ICD10-I70.0). Electronically Signed   By: Fredricka Jenny M.D.   On: 12/18/2023 16:29   DG Chest Port 1 View Result Date: 12/18/2023 CLINICAL DATA:  Questionable sepsis - evaluate for abnormality EXAM: PORTABLE CHEST - 1 VIEW COMPARISON:  None available. FINDINGS: No focal airspace consolidation, pleural effusion, or pneumothorax. No cardiomegaly. Aortic atherosclerosis. No acute fracture or destructive lesion. Multilevel thoracic osteophytosis. IMPRESSION: No acute cardiopulmonary abnormality. Electronically Signed   By: Rance Burrows M.D.   On: 12/18/2023 13:36      Subjective: Patient seen and examined at bedside.  Feels much better and feels okay to go home today.  Tolerating diet.  Diarrhea has much improved.  Denies current chest pain.  Discharge Exam: Vitals:   12/22/23 0808 12/22/23 0810  BP:    Pulse:    Resp:    Temp:    SpO2: 100% 100%    General: Pt is alert, awake, not in acute distress.  On room air.  Looks chronically ill and deconditioned.  Thinly built. Cardiovascular: rate controlled, S1/S2 + Respiratory: bilateral decreased breath sounds at bases Abdominal: Soft, NT, ND, bowel sounds + Extremities: no edema, no cyanosis    The results of significant diagnostics from this hospitalization (including imaging, microbiology, ancillary and laboratory) are listed below for reference.     Microbiology: Recent Results (from the past 240 hours)  Culture, blood (Routine X 2) w Reflex to ID Panel     Status: None (Preliminary result)   Collection Time: 12/19/23  9:40 AM   Specimen: BLOOD  Result Value Ref Range Status   Specimen Description   Final    BLOOD BLOOD LEFT ARM AEROBIC BOTTLE ONLY Performed at [facility], [address], [place], [place] [address]    Special Requests   Final    BOTTLES DRAWN AEROBIC ONLY Blood Culture results may not be optimal due to an inadequate volume of blood received in culture  bottles Performed at [facility], [address], [place], [place] [address]    Culture   Final    NO GROWTH 3 DAYS Performed at [facility], [address] [place], [place] [address]    Report Status PENDING  Incomplete  Culture, blood (Routine X 2) w Reflex to ID Panel     Status: None (Preliminary result)   Collection Time: 12/19/23  9:45 AM   Specimen: BLOOD  Result Value Ref Range Status   Specimen Description   Final    BLOOD BLOOD LEFT HAND AEROBIC BOTTLE ONLY Performed at [facility], [address] [place], [place] [address]    Special Requests   Final    BOTTLES DRAWN AEROBIC ONLY Blood Culture results may not be optimal due to an inadequate volume of blood received in culture bottles Performed at [facility], [address], [place], [place] [address]    Culture   Final    NO GROWTH 3 DAYS Performed at [facility], [address], [place], [place] [address]    Report Status PENDING  Incomplete  Gastrointestinal Panel by PCR , Stool     Status: Abnormal   Collection Time: 12/20/23  9:09 AM   Specimen: Stool  Result Value Ref Range Status   Campylobacter species NOT DETECTED NOT DETECTED Final   Plesimonas shigelloides NOT DETECTED NOT DETECTED Final   Salmonella species NOT  DETECTED NOT DETECTED Final   Yersinia enterocolitica NOT DETECTED NOT DETECTED Final   Vibrio species NOT DETECTED NOT DETECTED Final   Vibrio cholerae NOT DETECTED NOT DETECTED Final   Enteroaggregative E coli (EAEC) NOT DETECTED NOT DETECTED Final   Enteropathogenic E coli (EPEC) NOT DETECTED NOT DETECTED Final   Enterotoxigenic E coli (ETEC) DETECTED (A) NOT DETECTED Final    Comment: RESULT CALLED TO, READ BACK BY AND VERIFIED WITH: Barbara Book RN 1026 12/21/23 HNM    Shiga like toxin producing E coli (STEC) NOT DETECTED NOT DETECTED Final   Shigella/Enteroinvasive E coli (EIEC) NOT DETECTED NOT DETECTED Final    Cryptosporidium NOT DETECTED NOT DETECTED Final   Cyclospora cayetanensis NOT DETECTED NOT DETECTED Final   Entamoeba histolytica NOT DETECTED NOT DETECTED Final   Giardia lamblia NOT DETECTED NOT DETECTED Final   Adenovirus F40/41 NOT DETECTED NOT DETECTED Final   Astrovirus NOT DETECTED NOT DETECTED Final   Norovirus GI/GII DETECTED (A) NOT DETECTED Final    Comment: RESULT CALLED TO, READ BACK BY AND VERIFIED WITH: JANESSA DUDLEY RN 1026 12/21/23 HNM    Rotavirus A NOT DETECTED NOT DETECTED Final   Sapovirus (I, II, IV, and V) NOT DETECTED NOT DETECTED Final    Comment: Performed at [facility], [address] [place], [place] [address]     Labs: BNP (last 3 results) No results for input(s): "BNP" in the last 8760 hours. Basic Metabolic Panel: Recent Labs  Lab 12/18/23 1211 12/18/23 1849 12/19/23 0521 12/20/23 0525 12/21/23 0600 12/22/23 0546  NA 137  --  139 137 137 140  K <2.0*  --  2.6* 3.0* 3.0* 3.6  CL 113*  --  118* 120* 116* 118*  CO2 15*  --  13* 12* 14* 18*  GLUCOSE 100*  --  112* 80 84 85  BUN 27*  --  27* 21 14 19   CREATININE 1.64* 1.44* 1.51* 1.32* 1.09* 0.93  CALCIUM  8.8*  --  7.7* 6.9* 6.7* 7.4*  MG 2.1  --   --   --  1.8 2.0   Liver Function Tests: Recent Labs  Lab 12/18/23 1211 12/21/23 0600  AST 175* 175*  ALT 67* 79*  ALKPHOS 59 49  BILITOT 0.5 0.6  PROT 7.5 5.6*  ALBUMIN 3.3* 2.5*   No results for input(s): "LIPASE", "AMYLASE" in the last 168 hours. No results for input(s): "AMMONIA" in the last 168 hours. CBC: Recent Labs  Lab 12/18/23 1211 12/18/23 1849 12/19/23 0521 12/21/23 0600 12/22/23 0546  WBC 21.0* 21.1* 26.3* 11.1* 11.3*  NEUTROABS 18.1*  --   --  8.5* 8.8*  HGB 13.5 12.1 11.0* 10.7* 11.4*  HCT 39.4 36.4 33.4* 32.6* 36.2  MCV 82.6 83.1 86.3 86.0 88.7  PLT 482* 430* 404* 325 366   Cardiac Enzymes: No results for input(s): "CKTOTAL", "CKMB", "CKMBINDEX", "TROPONINI" in the last 168 hours. BNP: Invalid  input(s): "POCBNP" CBG: Recent Labs  Lab 12/18/23 1216  GLUCAP 96   D-Dimer No results for input(s): "DDIMER" in the last 72 hours. Hgb A1c No results for input(s): "HGBA1C" in the last 72 hours. Lipid Profile Recent Labs    12/20/23 0525  CHOL 139  HDL 30*  LDLCALC 67  TRIG 604*  CHOLHDL 4.6   Thyroid  function studies No results for input(s): "TSH", "T4TOTAL", "T3FREE", "THYROIDAB" in the last 72 hours.  Invalid input(s): "FREET3" Anemia work up No results for input(s): "VITAMINB12", "FOLATE", "FERRITIN", "TIBC", "IRON", "RETICCTPCT" in the last 72  hours. Urinalysis    Component Value Date/Time   COLORURINE YELLOW 12/18/2023 1748   APPEARANCEUR CLEAR 12/18/2023 1748   LABSPEC 1.009 12/18/2023 1748   PHURINE 6.0 12/18/2023 1748   GLUCOSEU NEGATIVE 12/18/2023 1748   HGBUR LARGE (A) 12/18/2023 1748   BILIRUBINUR NEGATIVE 12/18/2023 1748   BILIRUBINUR negative 02/26/2019 1426   KETONESUR NEGATIVE 12/18/2023 1748   PROTEINUR 100 (A) 12/18/2023 1748   UROBILINOGEN 0.2 02/26/2019 1426   UROBILINOGEN 1.0 04/01/2009 1425   NITRITE NEGATIVE 12/18/2023 1748   LEUKOCYTESUR TRACE (A) 12/18/2023 1748   Sepsis Labs Recent Labs  Lab 12/18/23 1849 12/19/23 0521 12/21/23 0600 12/22/23 0546  WBC 21.1* 26.3* 11.1* 11.3*   Microbiology Recent Results (from the past 240 hours)  Culture, blood (Routine X 2) w Reflex to ID Panel     Status: None (Preliminary result)   Collection Time: 12/19/23  9:40 AM   Specimen: BLOOD  Result Value Ref Range Status   Specimen Description   Final    BLOOD BLOOD LEFT ARM AEROBIC BOTTLE ONLY Performed at [facility], [address], [place], [place] [address]    Special Requests   Final    BOTTLES DRAWN AEROBIC ONLY Blood Culture results may not be optimal due to an inadequate volume of blood received in culture bottles Performed at [facility], [address] [place], [place] [address]    Culture    Final    NO GROWTH 3 DAYS Performed at [facility], [address] [place], [place] [address]    Report Status PENDING  Incomplete  Culture, blood (Routine X 2) w Reflex to ID Panel     Status: None (Preliminary result)   Collection Time: 12/19/23  9:45 AM   Specimen: BLOOD  Result Value Ref Range Status   Specimen Description   Final    BLOOD BLOOD LEFT HAND AEROBIC BOTTLE ONLY Performed at [facility], [address], [place], [place] [address]    Special Requests   Final    BOTTLES DRAWN AEROBIC ONLY Blood Culture results may not be optimal due to an inadequate volume of blood received in culture bottles Performed at [facility], [address] [place], [place] [address]    Culture   Final    NO GROWTH 3 DAYS Performed at [facility], [address], [place], [place] [address]    Report Status PENDING  Incomplete  Gastrointestinal Panel by PCR , Stool     Status: Abnormal   Collection Time: 12/20/23  9:09 AM   Specimen: Stool  Result Value Ref Range Status   Campylobacter species NOT DETECTED NOT DETECTED Final   Plesimonas shigelloides NOT DETECTED NOT DETECTED Final   Salmonella species NOT DETECTED NOT DETECTED Final   Yersinia enterocolitica NOT DETECTED NOT DETECTED Final   Vibrio species NOT DETECTED NOT DETECTED Final   Vibrio cholerae NOT DETECTED NOT DETECTED Final   Enteroaggregative E coli (EAEC) NOT DETECTED NOT DETECTED Final   Enteropathogenic E coli (EPEC) NOT DETECTED NOT DETECTED Final   Enterotoxigenic E coli (ETEC) DETECTED (A) NOT DETECTED Final    Comment: RESULT CALLED TO, READ BACK BY AND VERIFIED WITH: Barbara Book RN 1026 12/21/23 HNM    Shiga like toxin producing E coli (STEC) NOT DETECTED NOT DETECTED Final   Shigella/Enteroinvasive E coli (EIEC) NOT DETECTED NOT DETECTED Final   Cryptosporidium NOT DETECTED NOT DETECTED Final   Cyclospora cayetanensis NOT  DETECTED NOT DETECTED Final    Entamoeba histolytica NOT DETECTED NOT DETECTED Final   Giardia lamblia NOT DETECTED NOT DETECTED Final   Adenovirus F40/41 NOT DETECTED NOT DETECTED Final   Astrovirus NOT DETECTED NOT DETECTED Final   Norovirus GI/GII DETECTED (A) NOT DETECTED Final    Comment: RESULT CALLED TO, READ BACK BY AND VERIFIED WITH: Barbara Book RN 1026 12/21/23 HNM    Rotavirus A NOT DETECTED NOT DETECTED Final   Sapovirus (I, II, IV, and V) NOT DETECTED NOT DETECTED Final    Comment: Performed at [facility], [address] [place], [place] [address]     Time coordinating discharge: 35 minutes  SIGNED:   Audria Leather, MD  [facility] 12/22/2023, 9:52 AM

## 2023-12-24 LAB — CULTURE, BLOOD (ROUTINE X 2)
Culture: NO GROWTH
Culture: NO GROWTH

## 2023-12-25 ENCOUNTER — Telehealth: Payer: Self-pay | Admitting: *Deleted

## 2023-12-25 NOTE — Transitions of Care (Post Inpatient/ED Visit) (Signed)
   12/25/2023  Name: TANIYA BRUTUS MRN: [id number] DOB: 1951/01/02  Today's TOC FU Call Status: Today's TOC FU Call Status:: Unsuccessful Call (1st Attempt) Unsuccessful Call (1st Attempt) Date: 12/25/23  Attempted to reach the patient regarding the most recent Inpatient visit; left HIPAA compliant voice message requesting call back  Follow Up Plan: Additional outreach attempts will be made to reach the patient to complete the Transitions of Care (Post Inpatient visit) call.   Pls call/ message for questions,  Pierce Barocio Mckinney Veronnica Hennings, RN, BSN, CCRN Alumnus RN Care Manager  Transitions of Care  [facility] [phone]: direct office

## 2023-12-26 ENCOUNTER — Telehealth: Payer: Self-pay | Admitting: *Deleted

## 2023-12-26 NOTE — Transitions of Care (Post Inpatient/ED Visit) (Signed)
   12/26/2023  Name: Kaitlyn Adams MRN: [id number] DOB: 1950-09-26  Today's TOC FU Call Status: Today's TOC FU Call Status:: Unsuccessful Call (2nd Attempt) Unsuccessful Call (2nd Attempt) Date: 12/26/23  Attempted to reach the patient regarding the most recent Inpatient visit; left HIPAA compliant voice message requesting call back  Follow Up Plan: Additional outreach attempts will be made to reach the patient to complete the Transitions of Care (Post Inpatient visit) call.   Pls call/ message for questions,  Chasin Findling Mckinney Kalab Camps, RN, BSN, CCRN Alumnus RN Care Manager  Transitions of Care  [facility] [phone]: direct office

## 2023-12-27 ENCOUNTER — Telehealth: Payer: Self-pay | Admitting: *Deleted

## 2023-12-27 NOTE — Transitions of Care (Post Inpatient/ED Visit) (Signed)
   12/27/2023  Name: Kaitlyn Adams MRN: [id number] DOB: 1951-04-10  Today's TOC FU Call Status: Today's TOC FU Call Status:: Unsuccessful Call (3rd Attempt) Unsuccessful Call (3rd Attempt) Date: 12/27/23  Attempted to reach the patient regarding the most recent Inpatient visit  Phone rang without physical or voicemail pick up; unable to leave voice message requesting call-back   Follow Up Plan: No further outreach attempts will be made at this time. We have been unable to contact the patient.  Pls call/ message for questions,  Angellee Cohill Mckinney Nyazia Canevari, RN, BSN, CCRN Alumnus RN Care Manager  Transitions of Care  [facility] - [facility] [phone]: direct office

## 2023-12-29 ENCOUNTER — Ambulatory Visit (INDEPENDENT_AMBULATORY_CARE_PROVIDER_SITE_OTHER): Admitting: Family Medicine

## 2023-12-29 ENCOUNTER — Inpatient Hospital Stay: Admitting: Family Medicine

## 2023-12-29 ENCOUNTER — Encounter: Payer: Self-pay | Admitting: Family Medicine

## 2023-12-29 VITALS — BP 94/52 | HR 81 | Temp 97.9°F | Ht 63.0 in | Wt 97.0 lb

## 2023-12-29 DIAGNOSIS — E2740 Unspecified adrenocortical insufficiency: Secondary | ICD-10-CM

## 2023-12-29 DIAGNOSIS — E039 Hypothyroidism, unspecified: Secondary | ICD-10-CM

## 2023-12-29 DIAGNOSIS — N179 Acute kidney failure, unspecified: Secondary | ICD-10-CM

## 2023-12-29 DIAGNOSIS — E876 Hypokalemia: Secondary | ICD-10-CM

## 2023-12-29 DIAGNOSIS — R112 Nausea with vomiting, unspecified: Secondary | ICD-10-CM | POA: Diagnosis not present

## 2023-12-29 LAB — BASIC METABOLIC PANEL WITH GFR
BUN: 8 mg/dL (ref 6–23)
CO2: 22 meq/L (ref 19–32)
Calcium: 9.5 mg/dL (ref 8.4–10.5)
Chloride: 107 meq/L (ref 96–112)
Creatinine, Ser: 1.19 mg/dL (ref 0.40–1.20)
GFR: 45.11 mL/min — ABNORMAL LOW (ref >60.00)
Glucose, Bld: 83 mg/dL (ref 70–99)
Potassium: 4.7 meq/L (ref 3.5–5.1)
Sodium: 136 meq/L (ref 135–145)

## 2023-12-29 LAB — CBC WITH DIFFERENTIAL/PLATELET
Basophils Absolute: 0.1 10*3/uL (ref 0.0–0.1)
Basophils Relative: 0.9 % (ref 0.0–3.0)
Eosinophils Absolute: 0 10*3/uL (ref 0.0–0.7)
Eosinophils Relative: 0.5 % (ref 0.0–5.0)
HCT: 40.6 % (ref 36.0–46.0)
Hemoglobin: 12.8 g/dL (ref 12.0–15.0)
Lymphocytes Relative: 9.8 % — ABNORMAL LOW (ref 12.0–46.0)
Lymphs Abs: 0.9 10*3/uL (ref 0.7–4.0)
MCHC: 31.5 g/dL (ref 30.0–36.0)
MCV: 89 fl (ref 78.0–100.0)
Monocytes Absolute: 0.3 10*3/uL (ref 0.1–1.0)
Monocytes Relative: 3.4 % (ref 3.0–12.0)
Neutro Abs: 7.8 10*3/uL — ABNORMAL HIGH (ref 1.4–7.7)
Neutrophils Relative %: 85.4 % — ABNORMAL HIGH (ref 43.0–77.0)
Platelets: 351 10*3/uL (ref 150.0–400.0)
RBC: 4.56 Mil/uL (ref 3.87–5.11)
RDW: 16 % — ABNORMAL HIGH (ref 11.5–15.5)
WBC: 9.1 10*3/uL (ref 4.0–10.5)

## 2023-12-29 LAB — HEPATIC FUNCTION PANEL
ALT: 41 U/L — ABNORMAL HIGH (ref 0–35)
AST: 24 U/L (ref 0–37)
Albumin: 3.9 g/dL (ref 3.5–5.2)
Alkaline Phosphatase: 68 U/L (ref 39–117)
Bilirubin, Direct: 0.1 mg/dL (ref 0.0–0.3)
Total Bilirubin: 0.3 mg/dL (ref 0.2–1.2)
Total Protein: 7.3 g/dL (ref 6.0–8.3)

## 2023-12-29 LAB — TSH: TSH: 6.43 u[IU]/mL — ABNORMAL HIGH (ref 0.35–5.50)

## 2023-12-29 NOTE — Progress Notes (Unsigned)
   Subjective:    Patient ID: Kaitlyn Adams, female    DOB: October 08, 1950, 73 y.o.   MRN: [id number]  HPI Hospital f/u- pt was admitted 4/28-5/2 w/ generalized weakness, anorexia, and weight loss.  She had not had her usual steroid doses for 2-3 days prior to admission.  In the ER she was found to have severe hypokalemia and AKI.  Tx'd w/ IVF, IV steroids.  Sxs improved as oral intake improved.  IV steroids were switched to Hydrocortisone .  K+ had normalized prior to d/c.  Her stool PCR showed Norovirus and enterotoxigenic E coli.  Was given Cipro  prior to d/c.  She did have + troponins in hospital and Cards recommended outpt f/u.  Her metabolic acidosis was tx'd w/ bicarb drip while hospitalized and then oral sodium bicarb 650mg  TID x10 days.  Her statin was held due to mildly elevated LFTs.  Due to generalized weakness, home health PT was recommended.  Pt reports feeling somewhat better but still has 'a long way to go'.  Husband is encouraging her to eat regularly.  Pt states she is drinking fluids, husband feels she could do better.  No vomiting.  No diarrhea.  Home health has been in touch and is planning to do PT.   Denies CP, SOB above baseline.   Review of Systems For ROS see HPI     Objective:   Physical Exam Vitals reviewed.  Constitutional:      General: She is not in acute distress.    Appearance: She is well-developed.     Comments: Frail  HENT:     Head: Normocephalic and atraumatic.  Eyes:     Conjunctiva/sclera: Conjunctivae normal.     Pupils: Pupils are equal, round, and reactive to light.  Neck:     Thyroid : No thyromegaly.  Cardiovascular:     Rate and Rhythm: Normal rate and regular rhythm.     Heart sounds: Normal heart sounds. No murmur heard. Pulmonary:     Effort: Pulmonary effort is normal. No respiratory distress.     Breath sounds: Normal breath sounds.  Abdominal:     General: There is no distension.     Palpations: Abdomen is soft.     Tenderness:  There is no abdominal tenderness.  Musculoskeletal:     Cervical back: Normal range of motion and neck supple.  Lymphadenopathy:     Cervical: No cervical adenopathy.  Skin:    General: Skin is warm and dry.     Coloration: Skin is pale.  Neurological:     Mental Status: She is alert and oriented to person, place, and time.  Psychiatric:        Behavior: Behavior normal.           Assessment & Plan:

## 2023-12-29 NOTE — Patient Instructions (Signed)
 Follow up as needed or as scheduled We'll notify you of your lab results and make any changes if needed Home health will send me the orders to sign and we'll try and get as much PT as possible Make sure you are drinking plenty of fluids and eating regularly Continue to rest and allow yourself time to recover!!! Call with any questions or concerns Hang in there!!!

## 2023-12-30 DIAGNOSIS — M438X8 Other specified deforming dorsopathies, sacral and sacrococcygeal region: Secondary | ICD-10-CM | POA: Diagnosis not present

## 2023-12-30 DIAGNOSIS — Z9181 History of falling: Secondary | ICD-10-CM | POA: Diagnosis not present

## 2023-12-30 DIAGNOSIS — I700 Atherosclerosis of aorta: Secondary | ICD-10-CM | POA: Diagnosis not present

## 2023-12-30 DIAGNOSIS — I2510 Atherosclerotic heart disease of native coronary artery without angina pectoris: Secondary | ICD-10-CM | POA: Diagnosis not present

## 2023-12-30 DIAGNOSIS — I480 Paroxysmal atrial fibrillation: Secondary | ICD-10-CM | POA: Diagnosis not present

## 2023-12-30 DIAGNOSIS — E039 Hypothyroidism, unspecified: Secondary | ICD-10-CM | POA: Diagnosis not present

## 2023-12-30 DIAGNOSIS — I951 Orthostatic hypotension: Secondary | ICD-10-CM | POA: Diagnosis not present

## 2023-12-30 DIAGNOSIS — R634 Abnormal weight loss: Secondary | ICD-10-CM | POA: Diagnosis not present

## 2023-12-30 DIAGNOSIS — E271 Primary adrenocortical insufficiency: Secondary | ICD-10-CM | POA: Diagnosis not present

## 2023-12-30 DIAGNOSIS — E785 Hyperlipidemia, unspecified: Secondary | ICD-10-CM | POA: Diagnosis not present

## 2023-12-30 DIAGNOSIS — R413 Other amnesia: Secondary | ICD-10-CM | POA: Diagnosis not present

## 2023-12-30 DIAGNOSIS — I4510 Unspecified right bundle-branch block: Secondary | ICD-10-CM | POA: Diagnosis not present

## 2023-12-30 DIAGNOSIS — K219 Gastro-esophageal reflux disease without esophagitis: Secondary | ICD-10-CM | POA: Diagnosis not present

## 2023-12-30 DIAGNOSIS — N1831 Chronic kidney disease, stage 3a: Secondary | ICD-10-CM | POA: Diagnosis not present

## 2023-12-30 DIAGNOSIS — E538 Deficiency of other specified B group vitamins: Secondary | ICD-10-CM | POA: Diagnosis not present

## 2023-12-30 DIAGNOSIS — N179 Acute kidney failure, unspecified: Secondary | ICD-10-CM | POA: Diagnosis not present

## 2023-12-30 DIAGNOSIS — M47812 Spondylosis without myelopathy or radiculopathy, cervical region: Secondary | ICD-10-CM | POA: Diagnosis not present

## 2023-12-30 DIAGNOSIS — M797 Fibromyalgia: Secondary | ICD-10-CM | POA: Diagnosis not present

## 2023-12-30 DIAGNOSIS — A0811 Acute gastroenteropathy due to Norwalk agent: Secondary | ICD-10-CM | POA: Diagnosis not present

## 2023-12-30 DIAGNOSIS — Z681 Body mass index (BMI) 19 or less, adult: Secondary | ICD-10-CM | POA: Diagnosis not present

## 2023-12-30 DIAGNOSIS — Z7951 Long term (current) use of inhaled steroids: Secondary | ICD-10-CM | POA: Diagnosis not present

## 2023-12-30 DIAGNOSIS — E876 Hypokalemia: Secondary | ICD-10-CM | POA: Diagnosis not present

## 2023-12-30 NOTE — Progress Notes (Signed)
 Cardiology Office Note:    Date:  01/09/2024   ID:  Kaitlyn Adams, Kaitlyn Adams 1950-11-08, MRN [id number]  PCP:  Jess Morita, MD  Cardiologist:  Peter Swaziland, MD Cardiology APP:  Lamond Pilot, PA     Referring MD: Jess Morita, MD   Chief Complaint: hospital follow-up of chest pain  History of Present Illness:    Kaitlyn Adams is a 73 y.o. female with a history of non-obstructive CAD noted on cardiac catheterization in 04/2015, paroxysmal atrial fibrillation not on anticoagulation, paroxysmal SVT/ PAT, hypertension with prior orthostatic hypotension, hyperlipidemia, hyperthyroidism s/p thyroidectomy with subsequent hypothyroidism, CKD stage III, chronic hypokalemia, asthma,  Addison's disease on chronic steroids, GERD, and fibromyalgia who is followed by Dr. Swaziland and presents today for hospital follow-up of chest pain.  Patient was admitted in 04/2015 with chest pain and atrial fibrillation. Labs were notable for significant hypokalemia at that time but troponin as negative. LHC showed mild non-obstructive CAD and normal LV function and LVEDP. Echo showed LVEF of 65-70% with normal wall motion and diastolic parameters. Outpatient monitor showed normal sinus rhythm with short runs of paroxysmal SVT (longest run 16 beats) and PACs. Triggered symptoms of dizziness, heart racing, chest pressure, and shortness of breath did not correlated with any arrhythmias.   Over the last couple of years, she has had couple ED visits/ hospitalizations for orthostatic hypotension. This is usually in the setting of poor intake and improves with IV fluids. She has also had her antihypertensive adjusted. She has had multiple Echos and monitors which have been unremarkable. Monitors in 2021 and 2023 showed normal sinus rhythm with short runs of SVT/ PAT and PACs/ PVCs but no significant arrhythmias. Cortisol was noted to be low during once of these visits and she was ultimately started on  Florinef .  She has also had a couple admission for symptomatic anemia with hemoglobin dropping below 6 and severe iron deficiency anemia requiring blood transfusions and iron infusions.   She was last seen by Dr. Swaziland on 12/06/2023 at which time she was stable from a cardiac standpoint. She was advised to decrease Lasix  to only as needed uses. Of note, patient is no longer on anticoagulation for atrial fibrillation given history of severe GI bleeds and falls and no recurrent atrial fibrillation since 2016.   She was recently admitted from 12/18/2023 to 12/22/2023 after presenting with generalized weakness, nausea/ vomiting, diarrhea,  poor PO intake, and unintentional weight loss in setting of non-compliance with her chronic steroids. Labs were remarkable for severe hypokalemia with potassium <2.0 and mild AKI.  Stool GI PCR was positive for norovirus and enterotoxigenic E.coli. She was treated with IV fluids and antibiotics. She also reported some atypical chest pain that was reproducible with palpation. High-sensitivity troponin peaked at 214.  EKG showed diffuse ST depression felt to be secondary to significant electrolyte abnormalities and Digoxin  effect. Digoxin  level was 1.1. Echo showed LVEF of 67% with normal wall motion, normal RV function, and no significant valvular disease. Digoxin  was stopped due to sinus pauses and potassium was repleted. EKG changes significantly improved with this. Troponin elevation was felt to be due to demand ischemia. However, outpatient ischemic evaluation was recommended.   Patient presents today for follow-up. Here with husband.  Overall she is feeling much better since recent discharge. She has only had one episode of vomiting since discharge and her appetite is improved.  She had one episode of atypical chest pain last night that lasted  a couple minutes. This pain is located on a pinpoint area on her left upper chest near her shoulder and is reproducible with exam.  Husband states she has a history of costochondritis. Pain improves with Tylenol  and use of a heating pad. No other chest pain. No exertional chest pain. This does not sound cardiac in nature. She describes some mild dyspnea when walking longer distances but this is not new. No dyspnea with routine activities or at rest. No orthopnea or PND. She occasionally has some mild ankle edema in the morning that improves throughout the days as she gets up and walks around. This is not new and is stable. No palpitations, lightheadedness, dizziness, or syncope.     EKGs/Labs/Other Studies Reviewed:    The following studies were reviewed:  Cardiac Catheterization 05/15/2015: 1. Mild nonobstructive CAD as outlined above 2. Normal LV systolic function with normal LVEDP   Recommendations: Stop ASA Start Xarelto  20 mg daily tomorrow (CHADS-Vasc = 3 for age/HTN/female gender) Outpatient event monitor to evaluate for PAF as cause of symptoms _______________  Monitor 05/19/2015 to 06/17/2015: Normal sinus rhythm PACs PSVT- short nonsustained runs longest 16 beats Symptoms of fluttering and skipped beats correlate with arrhythmia. Symptoms of dizziness, heart racing, chest pressure, and SOB do not correlate with arrhythmia. _______________  Monitor 05/20/2020 to 06/18/2020: Normal sinus rhythm Rare PACs. Single 11 beat run of PAT on 06/02/20 at 4:17 am Symptoms do not correlate with arrhythmia. _______________  Monitor 06/28/2022 to 07/12/2022: Patient had a min HR of 66 bpm, max HR of 200 bpm, and avg HR of 81 bpm. Predominant underlying rhythm was Sinus Rhythm. Bundle Branch Block/IVCD was present. 63 Supraventricular Tachycardia runs occurred, the run with the fastest interval lasting 5  beats with a max rate of 200 bpm, the longest lasting 19 beats with an avg rate of 132 bpm. Isolated SVEs were rare (<1.0%), SVE Couplets were rare (<1.0%), and SVE Triplets were rare (<1.0%). Isolated VEs were rare  (<1.0%), VE Couplets were rare  (<1.0%), and no VE Triplets were present.   Normal sinus rhythm   Rare PACs and PVCs   Occastional short runs of SVT generally lasting less than 6 beats. longest 19 beats. no sustained episodes   No Afib. _______________  Echocardiogram 12/20/2023: Impressions: 1. Left ventricular ejection fraction by 3D volume is 67 %. The left  ventricle has normal function. The left ventricle has no regional wall  motion abnormalities. Left ventricular diastolic parameters were normal.  The average left ventricular global  longitudinal strain is -18.6 %. The global longitudinal strain is normal.   2. Right ventricular systolic function is normal. The right ventricular  size is normal. There is normal pulmonary artery systolic pressure.   3. The mitral valve is normal in structure. Trivial mitral valve  regurgitation. No evidence of mitral stenosis.   4. The aortic valve is normal in structure. Aortic valve regurgitation is  not visualized. No aortic stenosis is present.   5. The inferior vena cava is normal in size with greater than 50%  respiratory variability, suggesting right atrial pressure of 3 mmHg.   EKG:  EKG ordered today.   EKG Interpretation Date/Time:  Tuesday Jan 09 2024 08:20:08 EDT Ventricular Rate:  75 PR Interval:  178 QRS Duration:  130 QT Interval:  386 QTC Calculation: 431 R Axis:   94  Text Interpretation: Normal sinus rhythm Right bundle branch block Septal infarct , age undetermined  ST depression in leads II and aVF  as well as leads V3-V6 but these have been seen on prior tracings No significant changes compared to prior tracings Confirmed by Kiyan Burmester [phone]) on 01/09/2024 8:24:45 AM    Recent Labs: 12/22/2023: Magnesium  2.0 12/29/2023: ALT 41; BUN 8; Creatinine, Ser 1.19; Hemoglobin 12.8; Platelets 351.0; Potassium 4.7; Sodium 136; TSH 6.43  Recent Lipid Panel    Component Value Date/Time   CHOL 139 12/20/2023 0525   TRIG 211  (H) 12/20/2023 0525   HDL 30 (L) 12/20/2023 0525   CHOLHDL 4.6 12/20/2023 0525   VLDL 42 (H) 12/20/2023 0525   LDLCALC 67 12/20/2023 0525   LDLDIRECT 98.0 11/06/2020 1105    Physical Exam:    Vital Signs: BP 108/70 (BP Location: Left Arm, Patient Position: Sitting, Cuff Size: Normal)   Pulse 75   Ht 5\' 3"  (1.6 m)   Wt 94 lb 3.2 oz (42.7 kg)   SpO2 100%   BMI 16.69 kg/m     Wt Readings from Last 3 Encounters:  01/09/24 94 lb 3.2 oz (42.7 kg)  12/29/23 97 lb (44 kg)  12/21/23 106 lb 11.2 oz (48.4 kg)     General: 73 y.o. thin Caucasian female in no acute distress. HEENT: Normocephalic and atraumatic. Sclera clear.  Neck: Supple. No carotid bruits. No JVD. Heart: RRR. Distinct S1 and S2. No murmurs, gallops, or rubs. Pinpoint area of chest wall tenderness with palpation located on left upper chest near shoulder. Lungs: No increased work of breathing. Clear to ausculation bilaterally. No wheezes, rhonchi, or rales.  Extremities: No lower extremity edema.  Skin: Warm and dry. Neuro: No focal deficits. Psych: Normal affect. Responds appropriately.   Assessment:    1. Non-cardiac chest pain   2. Coronary artery disease involving native coronary artery of native heart without angina pectoris   3. Paroxysmal atrial fibrillation (HCC)   4. Paroxysmal atrial tachycardia (HCC)   5. Orthostatic hypotension   6. Hyperlipidemia, unspecified hyperlipidemia type   7. Stage 3 chronic kidney disease, unspecified whether stage 3a or 3b CKD (HCC)   8. Hypokalemia   9. Iron deficiency anemia due to chronic blood loss     Plan:     Non-Cardiac Chest Pain Non-Obstructive CAD Patient has a history of non-obstructive CAD on cardiac catheterization in 04/2015. She was recently admitted for multiple symptoms including atypical chest pain. High-sensitivity troponin was mildly elevated but this was felt to be consistent with demand ischemia. Echo showed LVEF of 67% with normal wall motion,  normal RV function, and no significant valvular disease. - She had one episode of very atypical chest pain last night that sounds musculoskeletal. Reproducible with palpation. No other chest pain. No exertional chest pain. - Will hold off on Aspirin  for now given history of severe iron deficiency anemia with prior GI bleeds.  - Previously on Lipitor 20mg  daily but this was held during recent admission.  - Chest pain sounds non-cardiac. Did offer cardiac PET stress test given elevated troponin during recent admission; however, patient declined which I think is reasonable.  Paroxysmal Atrial Fibrillation Paroxysmal Atrial Tachycardia/ SVT Patient has a history of atrial fibrillation in 2016. No documented recurrent atrial fibrillation since then but has had short runs of paroxysmal atrial tachycardia/ SVT noted on multiple monitors since then. - Maintaining sinus rhythm on exam. - Previously on a beta-blocker but this was stopped due to orthostatic hypotension.  - Previously on Digoxin  but this was stopped during recent admission due sinus pauses and significant ST changes concerning  for Digoxin  effect.  - Not on anticoagulation due to severe GI bleeds and falls and no recurrent atrial fibrillation since 2016.   Orthostatic Hypotension Patient has a history of orthostatic hypotension. She has known Addison's Disease and is on Hydrocortisone .  - BP stable. No orthostatic symptoms.  - Continue Hydrocortisone  10mg  twice daily.   Hyperlipidemia Lipid panel in 11/2023: Total Cholesterol 139, Triglycerides 211, HDL 30, LDL 67.  - Previously on Lipitor 20mg  daily but this was stopped during recent admission given elevated LFTs. Repeat labs earlier on 12/29/2023 showed ALT was still slightly elevated at 41 but AST had normalized. Will discuss with PharmD about restarting statin.  CKD Stage III Baseline around 1.0 to 1.2. Creatinine peaked at 1.51 during recent admission. Creatinine stable at 1.19 on most  recent labs on 12/29/2023 at PCP's office.  Chronic Hypokalemia Patient has a long history of recurrent severe hypokalemia. During recent admission, potassium <2.0. This was aggressively repleted and potassium improved at discharge. Potassium stable at 4.7 on most recent labs on 12/29/2023 at PCP's office. - Currently on KCl 60 mEq daily.  Iron Deficiency Anemia History of GI Bleeds Patient has a history of iron deficiency anemia and prior GI bleeds requiring blood transfusions and IV iron. Hemoglobin stable at 12.8 on recent labs on 12/29/2023.   Disposition: Follow up in 6 months.   Sable Cram, PA-C  01/09/2024 9:54 AM    [facility]

## 2024-01-01 ENCOUNTER — Other Ambulatory Visit: Payer: Self-pay

## 2024-01-01 ENCOUNTER — Encounter: Payer: Self-pay | Admitting: Family Medicine

## 2024-01-01 DIAGNOSIS — E039 Hypothyroidism, unspecified: Secondary | ICD-10-CM

## 2024-01-01 DIAGNOSIS — R300 Dysuria: Secondary | ICD-10-CM

## 2024-01-01 MED ORDER — LEVOTHYROXINE SODIUM 50 MCG PO TABS: 50.0000 ug | ORAL_TABLET | Freq: Every day | ORAL | 3 refills | Status: DC

## 2024-01-02 ENCOUNTER — Encounter: Payer: Self-pay | Admitting: Family Medicine

## 2024-01-02 NOTE — Assessment & Plan Note (Signed)
 Cr had normalized prior to d/c after IVF, K+, and bicarb.  Will repeat labs today and tx any abnormalities if present.

## 2024-01-02 NOTE — Assessment & Plan Note (Signed)
 Chronic problem.  TSH was mildly elevated while hospitalized.  Will repeat today and see if Levothyroxine  needs to be adjusted.

## 2024-01-02 NOTE — Assessment & Plan Note (Signed)
 Resolved.  Her metabolic acidosis was tx'd w/ IV bicarb and then she was transitioned to oral bicarb.  Overall she is feeling better.

## 2024-01-02 NOTE — Assessment & Plan Note (Signed)
 Pt has hx of similar.  K+ normalized prior to d/c.  No longer vomiting or having diarrhea.  Check K+ and replete prn.

## 2024-01-02 NOTE — Assessment & Plan Note (Signed)
 Ongoing issue for pt.  Her recent illness caused her to miss her regular steroid doses and required stress dosed steroids.  She reports feeling better than she was but still has a way to go.  Stressed importance of not missing medication.  Will continue to follow along.

## 2024-01-03 DIAGNOSIS — I480 Paroxysmal atrial fibrillation: Secondary | ICD-10-CM | POA: Diagnosis not present

## 2024-01-03 DIAGNOSIS — E271 Primary adrenocortical insufficiency: Secondary | ICD-10-CM | POA: Diagnosis not present

## 2024-01-03 DIAGNOSIS — A0811 Acute gastroenteropathy due to Norwalk agent: Secondary | ICD-10-CM | POA: Diagnosis not present

## 2024-01-03 DIAGNOSIS — I951 Orthostatic hypotension: Secondary | ICD-10-CM | POA: Diagnosis not present

## 2024-01-03 DIAGNOSIS — N1831 Chronic kidney disease, stage 3a: Secondary | ICD-10-CM | POA: Diagnosis not present

## 2024-01-03 DIAGNOSIS — N179 Acute kidney failure, unspecified: Secondary | ICD-10-CM | POA: Diagnosis not present

## 2024-01-08 ENCOUNTER — Telehealth: Payer: Self-pay

## 2024-01-08 NOTE — Telephone Encounter (Signed)
 Ok for verbal orders ?

## 2024-01-08 NOTE — Telephone Encounter (Signed)
 Okay to provide verbal orders for Occupational Therapy?

## 2024-01-08 NOTE — Telephone Encounter (Signed)
 Copied from CRM [id number]. Topic: Clinical - Home Health Verbal Orders >> Jan 05, 2024  4:58 PM Laird Pih wrote: Caller/Agency: [facility] Callback Number: [phone] Service Requested: Occupational Therapy Frequency: 1x a week for 2 weeks, for functional mobility, safety, health promotion, exercise & pain control Any new concerns about the patient? No

## 2024-01-08 NOTE — Telephone Encounter (Signed)
 Called and spoke to [facility] and provided the verbal orders

## 2024-01-09 ENCOUNTER — Encounter: Payer: Self-pay | Admitting: Student

## 2024-01-09 ENCOUNTER — Ambulatory Visit: Attending: Student | Admitting: Student

## 2024-01-09 VITALS — BP 108/70 | HR 75 | Ht 63.0 in | Wt 94.2 lb

## 2024-01-09 DIAGNOSIS — N183 Chronic kidney disease, stage 3 unspecified: Secondary | ICD-10-CM | POA: Insufficient documentation

## 2024-01-09 DIAGNOSIS — I2510 Atherosclerotic heart disease of native coronary artery without angina pectoris: Secondary | ICD-10-CM | POA: Insufficient documentation

## 2024-01-09 DIAGNOSIS — I480 Paroxysmal atrial fibrillation: Secondary | ICD-10-CM | POA: Diagnosis not present

## 2024-01-09 DIAGNOSIS — I4719 Other supraventricular tachycardia: Secondary | ICD-10-CM | POA: Diagnosis not present

## 2024-01-09 DIAGNOSIS — D500 Iron deficiency anemia secondary to blood loss (chronic): Secondary | ICD-10-CM | POA: Insufficient documentation

## 2024-01-09 DIAGNOSIS — E876 Hypokalemia: Secondary | ICD-10-CM | POA: Diagnosis not present

## 2024-01-09 DIAGNOSIS — E785 Hyperlipidemia, unspecified: Secondary | ICD-10-CM | POA: Insufficient documentation

## 2024-01-09 DIAGNOSIS — N1831 Chronic kidney disease, stage 3a: Secondary | ICD-10-CM | POA: Diagnosis not present

## 2024-01-09 DIAGNOSIS — R0789 Other chest pain: Secondary | ICD-10-CM | POA: Insufficient documentation

## 2024-01-09 DIAGNOSIS — I951 Orthostatic hypotension: Secondary | ICD-10-CM | POA: Insufficient documentation

## 2024-01-09 DIAGNOSIS — E271 Primary adrenocortical insufficiency: Secondary | ICD-10-CM | POA: Diagnosis not present

## 2024-01-09 DIAGNOSIS — N179 Acute kidney failure, unspecified: Secondary | ICD-10-CM | POA: Diagnosis not present

## 2024-01-09 DIAGNOSIS — A0811 Acute gastroenteropathy due to Norwalk agent: Secondary | ICD-10-CM | POA: Diagnosis not present

## 2024-01-09 MED ORDER — POTASSIUM CHLORIDE CRYS ER 20 MEQ PO TBCR: 60.0000 meq | EXTENDED_RELEASE_TABLET | Freq: Every day | ORAL | Status: AC

## 2024-01-09 NOTE — Patient Instructions (Signed)
 Medication Instructions:  The current medical regimen is effective;  continue present plan and medications as directed. Please refer to the Current Medication list given to you today.  *If you need a refill on your cardiac medications before your next appointment, please call your pharmacy*  Lab Work: NONE  Testing/Procedures: NONE  Follow-Up: At [facility], you and your health needs are our priority.  As part of our continuing mission to provide you with exceptional heart care, our providers are all part of one team.  This team includes your primary Cardiologist (physician) and Advanced Practice Providers or APPs (Physician Assistants and Nurse Practitioners) who all work together to provide you with the care you need, when you need it.  Your next appointment:   6 month(s)  Provider:   Peter Swaziland, MD

## 2024-01-11 ENCOUNTER — Encounter: Payer: Medicare Other | Attending: Psychology | Admitting: Psychology

## 2024-01-11 ENCOUNTER — Encounter: Payer: Self-pay | Admitting: Psychology

## 2024-01-11 ENCOUNTER — Telehealth: Payer: Self-pay

## 2024-01-11 DIAGNOSIS — F32A Depression, unspecified: Secondary | ICD-10-CM | POA: Insufficient documentation

## 2024-01-11 DIAGNOSIS — F419 Anxiety disorder, unspecified: Secondary | ICD-10-CM | POA: Diagnosis present

## 2024-01-11 DIAGNOSIS — R413 Other amnesia: Secondary | ICD-10-CM | POA: Insufficient documentation

## 2024-01-11 DIAGNOSIS — D509 Iron deficiency anemia, unspecified: Secondary | ICD-10-CM | POA: Insufficient documentation

## 2024-01-11 NOTE — Telephone Encounter (Signed)
Placed in your sign folder at the nurse station

## 2024-01-11 NOTE — Progress Notes (Signed)
 Neuropsychological Consultation   Patient:   Kaitlyn Adams   DOB:   1950/09/14  MR Number:  [id number]  Location:  [facility] [address] [place], [address] [place] [address] Dept: [phone]           Date of Service:   01/11/2024  Location of Service and Individuals present: Today's visit was conducted in outpatient clinic office with the patient, her husband and myself present.  Start Time:   8 AM End Time:   10 AM  Today's visit included a 1 hour and 15-minute face-to-face clinical interview and 45 minutes was spent with record review, report writing and setting up testing protocols.  Patient Consent and Confidentiality: Patient consent and review of limits of confidentiality were performed prior to the clinical interview.  Patient has been informed of the purpose of the visit being a neuropsychological evaluation with report to be created and made available to not only her referring PCP but also made available in the patient's electronic medical records.  Patient has been consented to proceed with the evaluation.  Consent for Evaluation and Treatment:  Signed:  Yes Explanation of Privacy Policies:  Signed:  Yes Discussion of Confidentiality Limits:  Yes  Provider/Observer:  Chapman Commodore, Psy.D.       Clinical Neuropsychologist       Billing Code/Service: 96116/96121  Chief Complaint:     Chief Complaint  Patient presents with   Memory Loss    Reason for Service:    Kaitlyn Adams is a 73 year old female referred for neuropsychological evaluation by her primary care provider Laymon Priest, MD due to reports of increasing cognitive difficulties related to memory change.  Patient has been dealing with a number of medical issues and has been recently hospitalized due to significant anemia requiring blood transfusion, fatigue and renal insufficiency.  Patient also  with diagnosis of Addison disease, glaucoma, hyperlipidemia, hypertension, hypothyroidism, mild CAD, and anemia.  Patient is being followed by oncology for her anemia.  Patient and husband note that memory difficulties started between 6 and 12 months ago and the initial changes were around the times of her acute anemia with hospitalization.  Patient is noted to be improving with regard to her blood work and has gone from being too weak to even walk to significant improvements in ambulation and strength.  However, the patient has been note that there continue to be some memory difficulties particularly for semantic type memories.  Patient's husband reports that the patient will ask a question and after it has been answered she will ask the same question again 15 minutes later.  Patient has noted to continue with weakness and balance issues.  Patient and husband deny any change in geographic orientation, changes in expressive or receptive language capacity or other cognitive changes.  The patient and husband describe gradual changes after the initial change during her medical illness and for the most part it is stabilized over the past couple of months with no progressive change noted.  Patient and husband both deny any tremors, visual or auditory hallucinations or delusions.  Patient has had CT scan of brain done recently that identified no acute process such as infarct or hemorrhage and no identified abnormality noted.  Patient has not had an MRI of brain.  Lab work over the past several months have showed improvements in white blood cell count (21.0-9.1) and improvements in red blood cell count (current  4.56).  Folate and B12 studies were within normal limits.  The patient reports that both of her parents developed memory and cognitive difficulties in their 43s.  The patient reports that her mother started having progressive memory loss that progressed relatively quickly and starting around 74 years of age and  coincided with significant physical decline and the patient's mother was placed in skilled nursing facility.  The patient's father developed progressive cognitive decline that progressed relatively quickly but he also had cardiovascular illness and had open heart surgery.  His change started between age 62 and 60.  Patient was started on donezepil by her PCP because of complaints of memory difficulties without descriptions of any side effects.  Medical History:   Past Medical History:  Diagnosis Date   Addison disease (HCC)    Anemia    Asthma    - normal spirometry 2009 FEV1  >90% predicted.  - methacoline challenge neg 11/09   Bronchitis    recurrent   Cataract    Chronic cough    - sinus CT 03-23-10 > neg.  - allergy  profile 03-23-10 > nl, IgE 14.  - flutter valve rx 03-23-10   Fibromyalgia    GERD (gastroesophageal reflux disease)    exacerbating VCD   Glaucoma    History of blood transfusion    Hyperlipidemia    Hypertension    Hyperthyroidism    with hot nodule.  - total thyroidectomy 11-05-08 benign -> subsequent hypothyroidism.   Hypokalemia    Hypothyroidism    Hypothyroidism    a. following thyroidectomy.   Mild CAD    a. LHC 04/2015 - mild nonobstructive CAD with 30% dLAD, 40% D1, 25% pLCx, 30% mRCA, 30% RPDA, normal EF >65% with normal LVEDP.   Orthostatic hypotension    Paroxysmal atrial fibrillation (HCC)    Renal insufficiency    Vasomotor rhinitis    exacerbates VCD   Vocal cord dysfunction    proven on FOB 9/09         Patient Active Problem List   Diagnosis Date Noted   Elevated troponin 12/20/2023   RBBB 12/20/2023   Sinus pause 12/20/2023   Adrenal insufficiency (Addison's disease) (HCC) 12/20/2023   AKI (acute kidney injury) (HCC) 12/18/2023   Nausea and vomiting 12/18/2023   Frequent falls 02/07/2023   Candida esophagitis (HCC) 12/18/2022   Benign neoplasm of ascending colon 12/18/2022   Benign neoplasm of transverse colon 12/18/2022   Anal stricture  12/18/2022   CKD (chronic kidney disease) stage 3, GFR 30-59 ml/min (HCC) 12/17/2022   Adrenal insufficiency (HCC) 11/06/2020   Disorder of bone 11/06/2020   Disorder of calcium  metabolism 11/06/2020   Vitamin D  deficiency 11/06/2020   Hypokalemia 10/25/2018   Moderate protein-calorie malnutrition (HCC) 08/01/2018   Upper airway cough syndrome 05/22/2017   Symptomatic anemia 04/26/2017   Chronic constipation 07/07/2016   History of colonic polyps 07/07/2016   Chronic anticoagulation 07/07/2016   Iron deficiency anemia 06/15/2016   Anxiety and depression 05/18/2016   Physical exam 11/18/2015   Pain in joint, ankle and foot 11/16/2015   PAT (paroxysmal atrial tachycardia) (HCC) 09/08/2015   Hyperlipidemia 07/30/2015   CAD (coronary artery disease)    Chest pain of uncertain etiology 05/15/2015   Chronic bronchitis (HCC) 06/17/2011   Orthostatic hypotension 12/06/2010   PAF (paroxysmal atrial fibrillation) (HCC)    Renal insufficiency    Myalgia    Hypothyroidism    Glaucoma    GERD, SEVERE 08/19/2010   Osteoarthritis 04/17/2008  FIBROMYALGIA 04/17/2008   Current Typical Mood State:  Appropriate  Sleep: Patient reports that she has times with difficult sleep patterns.  However, she also does fairly well with sleep at other times.  Patient reports that she typically goes to bed between 930 and 10 and will usually fall asleep in 5 or 10 minutes.  Patient reports that at other times she will be until midnight.  Patient reports that she sleeps till 6 AM but husband notes that there are times where she will stay in bed for extended periods of time due to her fatigue.  Diet Pattern: Patient reports good appetite but she does not always eat of well-rounded health diet due to not having a reason to cook large meals for just the patient and her husband.  I did encourage the patient to be more thoughtful and eating vegetables and other whole foods.  Behavioral Observation/Mental  Status:   Kaitlyn Adams  presents as a 73 y.o.-year-old Right handed Caucasian Female who appeared her stated age. her dress was Appropriate and she was Well Groomed and her manners were Appropriate to the situation.  her participation was indicative of Appropriate behaviors.  There were physical disabilities noted due to general weakness.  she displayed an appropriate level of cooperation and motivation.    Interactions:    Active Appropriate  Attention:   within normal limits and attention span and concentration were age appropriate  Memory:   abnormal; remote memory intact, recent memory impaired  Visuo-spatial:   not examined  Speech (Volume):  low  Speech:   normal; normal  Thought Process:  Coherent and Relevant  Coherent, Linear, and Logical  Though Content:  WNL; not suicidal and not homicidal  Orientation:   person, place, time/date, and situation  Judgment:   Fair  Planning:   Fair  Affect:    Appropriate  Mood:    Euthymic  Insight:   Good  Intelligence:   normal  Marital Status/Living:  Patient was born and raised in [place]  along with 2 siblings.  Normal childhood illnesses were noted.  Patient currently lives with her husband of 32 years and she was never married prior.  Patient has an adult son who is 64 years old and in good health.  Educational and Occupational History:     Highest Level of Education:  Patient graduated from [employer] having some relative skills in math and relative weakness in Latin classes.  Patient never repeated any grades.  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   Psychiatric History:  No prior psychiatric history other than diagnoses of anxiety and depression in the past.  Patient takes amitriptyline  at night to facilitate onset of  sleep.   History of Substance Use or Abuse:  No concerns of substance abuse are reported.  Patient reports that she has an occasional beer but no other substance use.  Family Med/Psych History:  Family History  Problem Relation Age of Onset   Asthma Mother    Heart disease Mother    Emphysema Father    Heart disease Father    Irregular heart beat Brother    Asthma Grandchild     Impression/DX:    Patient Information: Kaitlyn Adams, a 73 year old female, was referred for neuropsychological evaluation by her primary care provider, Dr. Dana Duncan  Tabori, due to concerns about increasing cognitive difficulties, particularly related to memory changes. Medical History: Kaitlyn Adams has been managing multiple health conditions, including Addison's disease, glaucoma, hyperlipidemia, hypertension, hypothyroidism, mild coronary artery disease, and anemia. She was recently hospitalized due to severe anemia, which required blood transfusion, along with fatigue and renal insufficiency. She is currently under oncology care for ongoing monitoring of her anemia. Cognitive Concerns: Kaitlyn Adams and her husband report that memory difficulties began between six and twelve months ago, coinciding with her acute anemia and hospitalization. While her blood work has improved significantly--allowing her to regain strength and mobility--she continues to experience memory challenges, primarily affecting semantic memory. Her husband notes that she often asks a question and repeats the same question within 15 minutes, despite having received an answer. Functional Status: Although she continues to experience weakness and balance issues, there have been no reported changes in geographic orientation, expressive or receptive language abilities, or other cognitive functions. Her memory changes initially appeared during her medical illness, followed by gradual stabilization over the past couple of months, with no signs of progressive  decline.  Disposition/Plan:  We have set the patient up for formal neuropsychological testing/assessment and will start with a foundational battery of the Wechsler Adult Intelligence Scale, Wechsler Memory Scale's, grooved pegboard test and the controlled oral Word Association test.  Once this foundational battery is completed a determination will be made as to any potential need for further assessment strategies.  After the assessment is completed a formal report will be produced and made available to her referring physician as well as being made available in the patient's EMR.  Diagnosis:    Memory loss  Iron deficiency anemia, unspecified iron deficiency anemia type  Anxiety and depression        Note: This document was prepared using Dragon voice recognition software and may include unintentional dictation errors.   Electronically Signed   _______________________ Chapman Commodore, Psy.D. Clinical Neuropsychologist

## 2024-01-11 NOTE — Telephone Encounter (Signed)
 Placed in front office providers folder, needs provider signature and fax back to [facility]

## 2024-01-12 ENCOUNTER — Other Ambulatory Visit (INDEPENDENT_AMBULATORY_CARE_PROVIDER_SITE_OTHER)

## 2024-01-12 ENCOUNTER — Ambulatory Visit: Payer: Self-pay | Admitting: Family Medicine

## 2024-01-12 DIAGNOSIS — I2510 Atherosclerotic heart disease of native coronary artery without angina pectoris: Secondary | ICD-10-CM | POA: Diagnosis not present

## 2024-01-12 DIAGNOSIS — A0811 Acute gastroenteropathy due to Norwalk agent: Secondary | ICD-10-CM | POA: Diagnosis not present

## 2024-01-12 DIAGNOSIS — E538 Deficiency of other specified B group vitamins: Secondary | ICD-10-CM | POA: Diagnosis not present

## 2024-01-12 DIAGNOSIS — E876 Hypokalemia: Secondary | ICD-10-CM | POA: Diagnosis not present

## 2024-01-12 DIAGNOSIS — I951 Orthostatic hypotension: Secondary | ICD-10-CM | POA: Diagnosis not present

## 2024-01-12 DIAGNOSIS — E039 Hypothyroidism, unspecified: Secondary | ICD-10-CM

## 2024-01-12 DIAGNOSIS — N1831 Chronic kidney disease, stage 3a: Secondary | ICD-10-CM | POA: Diagnosis not present

## 2024-01-12 DIAGNOSIS — E271 Primary adrenocortical insufficiency: Secondary | ICD-10-CM | POA: Diagnosis not present

## 2024-01-12 DIAGNOSIS — I480 Paroxysmal atrial fibrillation: Secondary | ICD-10-CM | POA: Diagnosis not present

## 2024-01-12 DIAGNOSIS — I4510 Unspecified right bundle-branch block: Secondary | ICD-10-CM | POA: Diagnosis not present

## 2024-01-12 DIAGNOSIS — I700 Atherosclerosis of aorta: Secondary | ICD-10-CM | POA: Diagnosis not present

## 2024-01-12 DIAGNOSIS — N179 Acute kidney failure, unspecified: Secondary | ICD-10-CM | POA: Diagnosis not present

## 2024-01-12 LAB — TSH: TSH: 3.26 u[IU]/mL (ref 0.35–5.50)

## 2024-01-12 NOTE — Telephone Encounter (Signed)
 Form completed and returned to [employer]

## 2024-01-12 NOTE — Telephone Encounter (Signed)
 Placed with front desk to be faxed.

## 2024-01-15 DIAGNOSIS — E271 Primary adrenocortical insufficiency: Secondary | ICD-10-CM | POA: Diagnosis not present

## 2024-01-15 DIAGNOSIS — I951 Orthostatic hypotension: Secondary | ICD-10-CM | POA: Diagnosis not present

## 2024-01-15 DIAGNOSIS — N1831 Chronic kidney disease, stage 3a: Secondary | ICD-10-CM | POA: Diagnosis not present

## 2024-01-15 DIAGNOSIS — N179 Acute kidney failure, unspecified: Secondary | ICD-10-CM | POA: Diagnosis not present

## 2024-01-15 DIAGNOSIS — I480 Paroxysmal atrial fibrillation: Secondary | ICD-10-CM | POA: Diagnosis not present

## 2024-01-15 DIAGNOSIS — A0811 Acute gastroenteropathy due to Norwalk agent: Secondary | ICD-10-CM | POA: Diagnosis not present

## 2024-01-16 ENCOUNTER — Encounter (HOSPITAL_BASED_OUTPATIENT_CLINIC_OR_DEPARTMENT_OTHER): Payer: Self-pay

## 2024-01-16 ENCOUNTER — Ambulatory Visit (HOSPITAL_BASED_OUTPATIENT_CLINIC_OR_DEPARTMENT_OTHER): Admitting: *Deleted

## 2024-01-16 DIAGNOSIS — Z0000 Encounter for general adult medical examination without abnormal findings: Secondary | ICD-10-CM

## 2024-01-16 DIAGNOSIS — Z1231 Encounter for screening mammogram for malignant neoplasm of breast: Secondary | ICD-10-CM

## 2024-01-16 NOTE — Progress Notes (Signed)
 Subjective:   Kaitlyn Adams is a 73 y.o. female who presents for Medicare Annual (Subsequent) preventive examination.  Visit Complete: Virtual I connected with  Kaitlyn Adams on 01/16/24 by a audio enabled telemedicine application and verified that I am speaking with the correct person using two identifiers.  Patient Location: Home  Provider Location: Home Office  I discussed the limitations of evaluation and management by telemedicine. The patient expressed understanding and agreed to proceed.  Vital Signs: Because this visit was a virtual/telehealth visit, some criteria may be missing or patient reported. Any vitals not documented were not able to be obtained and vitals that have been documented are patient reported.   Cardiac Risk Factors include: advanced age (>35men, >29 women)     Objective:     There were no vitals filed for this visit. There is no height or weight on file to calculate BMI.     01/16/2024    8:16 AM 10/19/2023    1:50 PM 04/11/2023    9:35 AM 03/01/2023    9:39 AM 01/31/2023   11:33 AM 12/17/2022    6:10 AM 12/16/2022    1:26 PM  Advanced Directives  Does Patient Have a Medical Advance Directive? Yes Yes Yes Yes Yes  No  Type of Estate agent of [employer] Power of [place];Living will Living will;Healthcare Power of Attorney Living will;Healthcare Power of Attorney    Does patient want to make changes to medical advance directive?  No - Patient declined No - Patient declined No - Patient declined No - Patient declined    Copy of Healthcare Power of Attorney in Chart? No - copy requested  Yes - validated most recent copy scanned in chart (See row information)      Would patient like information on creating a medical advance directive?  No - Patient declined  No - Patient declined No - Patient declined No - Patient declined     Current Medications (verified) Outpatient Encounter Medications as of 01/16/2024   Medication Sig   Acetaminophen  (TYLENOL  PO) Take by mouth as needed.   albuterol  (VENTOLIN  HFA) 108 (90 Base) MCG/ACT inhaler INHALE 2 PUFFS INTO THE LUNGS EVERY 6 HOURS AS NEEDED FOR WHEEZING OR SHORTNESS OF BREATH   amitriptyline  (ELAVIL ) 50 MG tablet TAKE 1 TABLET BY MOUTH AT BEDTIME   cholecalciferol (VITAMIN D3) 25 MCG (1000 UNIT) tablet Take 1,000 Units by mouth daily.   cyanocobalamin  (VITAMIN B12) 1000 MCG tablet Take 1 tablet (1,000 mcg total) by mouth daily.   diphenhydramine -acetaminophen  (TYLENOL  PM) 25-500 MG TABS tablet Take 1 tablet by mouth at bedtime as needed (pain).   donepezil  (ARICEPT ) 5 MG tablet TAKE 1 TABLET(5 MG) BY MOUTH AT BEDTIME   Ferrous Sulfate  (IRON PO) Take 1 tablet by mouth daily.   fluticasone -salmeterol (ADVAIR DISKUS) 250-50 MCG/ACT AEPB Inhale 1 puff into the lungs in the morning and at bedtime.   hydrocortisone  (CORTEF ) 10 MG tablet Take 10 mg by mouth 2 (two) times daily. Patient reports taking BID 09/07/2022   hydroxypropyl methylcellulose / hypromellose (ISOPTO TEARS / GONIOVISC) 2.5 % ophthalmic solution Place 2 drops into both eyes daily as needed for dry eyes.   levocetirizine (XYZAL ) 5 MG tablet Take 1 tablet (5 mg total) by mouth daily as needed for allergies.   levothyroxine  (SYNTHROID ) 50 MCG tablet Take 1 tablet (50 mcg total) by mouth daily.   nitroGLYCERIN  (NITROSTAT ) 0.4 MG SL tablet Place 1 tablet (0.4 mg total) under  the tongue every 5 (five) minutes as needed for chest pain.   potassium chloride  SA (KLOR-CON  M) 20 MEQ tablet Take 3 tablets (60 mEq total) by mouth daily.   No facility-administered encounter medications on file as of 01/16/2024.    Allergies (verified) Fenofibrate , Erythromycin, Guaifenesin  er, and Sulfonamide derivatives   History: Past Medical History:  Diagnosis Date   Addison disease (HCC)    Anemia    Asthma    - normal spirometry 2009 FEV1  >90% predicted.  - methacoline challenge neg 11/09   Bronchitis     recurrent   Cataract    Chronic cough    - sinus CT 03-23-10 > neg.  - allergy  profile 03-23-10 > nl, IgE 14.  - flutter valve rx 03-23-10   Fibromyalgia    GERD (gastroesophageal reflux disease)    exacerbating VCD   Glaucoma    History of blood transfusion    Hyperlipidemia    Hypertension    Hyperthyroidism    with hot nodule.  - total thyroidectomy 11-05-08 benign -> subsequent hypothyroidism.   Hypokalemia    Hypothyroidism    Hypothyroidism    a. following thyroidectomy.   Mild CAD    a. LHC 04/2015 - mild nonobstructive CAD with 30% dLAD, 40% D1, 25% pLCx, 30% mRCA, 30% RPDA, normal EF >65% with normal LVEDP.   Orthostatic hypotension    Paroxysmal atrial fibrillation (HCC)    Renal insufficiency    Vasomotor rhinitis    exacerbates VCD   Vocal cord dysfunction    proven on FOB 9/09   Past Surgical History:  Procedure Laterality Date   ABDOMINAL HYSTERECTOMY  1980   ABDOMINAL HYSTERECTOMY     BASAL CELL CARCINOMA EXCISION  11/08   BIOPSY  12/18/2022   Procedure: BIOPSY;  Surgeon: Kenney Peacemaker, MD;  Location: [facility];  Service: Gastroenterology;;   CARDIAC CATHETERIZATION N/A 05/15/2015   Procedure: Left Heart Cath and Coronary Angiography;  Surgeon: Arnoldo Lapping, MD;  Location: [facility] INVASIVE CV LAB;  Service: Cardiovascular;  Laterality: N/A;   COLONOSCOPY N/A 04/27/2017   Dr Leonia Raman for scant rectal bleeding and iron def anemia: Melanosis coli, lipomatous IC valve, left sided tics, non-bleeding hemorrhoid, 10 mm transverse polyp.     COLONOSCOPY WITH PROPOFOL  N/A 12/18/2022   Procedure: COLONOSCOPY WITH PROPOFOL ;  Surgeon: Kenney Peacemaker, MD;  Location: [facility] ENDOSCOPY;  Service: Gastroenterology;  Laterality: N/A;   ESOPHAGOGASTRODUODENOSCOPY N/A 04/27/2017   Sergio Dandy, MD; [facility]. for iron def anemia.  Monilial/candidial esophagitis. Small HH.     ESOPHAGOGASTRODUODENOSCOPY (EGD) WITH PROPOFOL  N/A 12/18/2022   Procedure: ESOPHAGOGASTRODUODENOSCOPY (EGD)  WITH PROPOFOL ;  Surgeon: Kenney Peacemaker, MD;  Location: [facility] ENDOSCOPY;  Service: Gastroenterology;  Laterality: N/A;   EYE SURGERY     POLYPECTOMY  12/18/2022   Procedure: POLYPECTOMY;  Surgeon: Kenney Peacemaker, MD;  Location: [facility] ENDOSCOPY;  Service: Gastroenterology;;   scar tissue removal  1982   THYROID  SURGERY  2010   THYROIDECTOMY  11-05-08   Family History  Problem Relation Age of Onset   Asthma Mother    Heart disease Mother    Emphysema Father    Heart disease Father    Irregular heart beat Brother    Asthma Grandchild    Social History   Socioeconomic History   Marital status: Married    Spouse name: Not on file   Number of children: 1   Years of education: Not on file   Highest education  level: 12th grade  Occupational History   Occupation: owns a Museum/gallery curator   Occupation: OWNER    Employer: [employer]  Tobacco Use   Smoking status: Never   Smokeless tobacco: Never  Vaping Use   Vaping status: Never Used  Substance and Sexual Activity   Alcohol  use: Yes    Alcohol /week: 1.0 standard drink of alcohol     Types: 1 Glasses of wine per week    Comment: occasional wine or beer   Drug use: No   Sexual activity: Yes    Birth control/protection: None  Other Topics Concern   Not on file  Social History Narrative   Married, 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   Occasional wine or beer no alcohol  tobacco or drug use otherwise   Social Drivers of Corporate investment banker Strain: Low Risk  (01/16/2024)   Overall Financial Resource Strain (CARDIA)    Difficulty of Paying Living Expenses: Not hard at all  Food Insecurity: No Food Insecurity (01/16/2024)   Hunger Vital Sign    Worried About Running Out of Food in the Last Year: Never true    Ran Out of Food in the Last Year: Never true  Transportation Needs: No Transportation Needs (01/16/2024)   PRAPARE - Administrator, Civil Service (Medical): No    Lack of  Transportation (Non-Medical): No  Physical Activity: Insufficiently Active (01/16/2024)   Exercise Vital Sign    Days of Exercise per Week: 3 days    Minutes of Exercise per Session: 30 min  Stress: No Stress Concern Present (01/16/2024)   [employer] of Occupational Health - Occupational Stress Questionnaire    Feeling of Stress : Only a little  Social Connections: Moderately Integrated (01/16/2024)   Social Connection and Isolation Panel [NHANES]    Frequency of Communication with Friends and Family: More than three times a week    Frequency of Social Gatherings with Friends and Family: Never    Attends Religious Services: 1 to 4 times per year    Active Member of [employer] or Organizations: No    Attends Banker Meetings: Never    Marital Status: Married  Recent Concern: Social Connections - Moderately Isolated (12/18/2023)   Social Connection and Isolation Panel [NHANES]    Frequency of Communication with Friends and Family: Three times a week    Frequency of Social Gatherings with Friends and Family: Once a week    Attends Religious Services: Never    Database administrator or Organizations: No    Attends Engineer, structural: Never    Marital Status: Married    Tobacco Counseling Counseling given: Not Answered   Clinical Intake:  Pre-visit preparation completed: Yes  Pain : No/denies pain     Diabetes: No  How often do you need to have someone help you when you read instructions, pamphlets, or other written materials from your doctor or pharmacy?: 1 - Never  Interpreter Needed?: No  Information entered by :: Kieth Pelt LPN   Activities of Daily Living    01/16/2024    8:19 AM  In your present state of health, do you have any difficulty performing the following activities:  Hearing? 1  Vision? 0  Difficulty concentrating or making decisions? 1  Walking or climbing stairs? 1  Dressing or bathing? 0  Doing errands, shopping? 0   Preparing Food and eating ? N  Using the Toilet? N  In  the past six months, have you accidently leaked urine? N  Do you have problems with loss of bowel control? N  Managing your Medications? N  Managing your Finances? N  Housekeeping or managing your Housekeeping? N    Patient Care Team: Jess Morita, MD as PCP - General (Family Medicine) [place], Peter M, MD as PCP - Cardiology (Cardiology) Tasia Farr, MD as Consulting Physician (Endocrinology) Cindra Cree, MD as Consulting Physician (Ophthalmology) [place], Peter M, MD as Consulting Physician (Cardiology) Diamond Formica, MD as Consulting Physician (Pulmonary Disease) Myrle Aspen, [facility] (Inactive) (Pharmacist) Duke, Warren Haber, PA as Physician Assistant (Cardiology)  Indicate any recent Medical Services you may have received from other than [facility] providers in the past year (date may be approximate).     Assessment:    This is a routine wellness examination for [place].  Hearing/Vision screen Hearing Screening - Comments:: Bilateral hearing aids Vision Screening - Comments:: Up to date Unsure of name   Goals Addressed             This Visit's Progress    patient   On track    Stay active.      Patient Stated   On track    Attempt to decrease work load, relax.      Patient Stated   Not on track    Stay as active as can     Patient Stated       Get stronger       Depression Screen    01/16/2024    8:22 AM 12/29/2023   10:42 AM 10/20/2023    8:08 AM 09/21/2023    8:06 AM 05/01/2023   12:42 PM 03/13/2023    3:00 PM 02/07/2023    8:04 AM  PHQ 2/9 Scores  PHQ - 2 Score 0 0 0 0 0 0 0  PHQ- 9 Score 2 0 0  0 1 3    Fall Risk    01/16/2024    8:15 AM 12/29/2023   10:42 AM 10/20/2023    8:08 AM 09/21/2023    8:06 AM 08/09/2023    9:36 AM  Fall Risk   Falls in the past year? 1 1 1 1  0  Number falls in past yr: 1 0 1 0 0  Injury with Fall? 0 1 0 0 0  Risk for fall due to : Impaired mobility   Impaired balance/gait;Impaired mobility  No Fall Risks  Follow up Falls evaluation completed;Education provided;Falls prevention discussed  Falls evaluation completed  Falls evaluation completed    MEDICARE RISK AT HOME: Medicare Risk at Home Any stairs in or around the home?: Yes If so, are there any without handrails?: No Home free of loose throw rugs in walkways, pet beds, electrical cords, etc?: Yes Adequate lighting in your home to reduce risk of falls?: Yes Life alert?: No Use of a cane, walker or w/c?: Yes Grab bars in the bathroom?: No Shower chair or bench in shower?: Yes Elevated toilet seat or a handicapped toilet?: No  TIMED UP AND GO:  Was the test performed?  No    Cognitive Function:    07/11/2018    8:26 AM 07/06/2017    8:28 AM  MMSE - Mini Mental State Exam  Orientation to time 5 5  Orientation to Place 5 5  Registration 3 3  Attention/ Calculation 5 5  Recall 2 3  Language- name 2 objects 2 2  Language- repeat 1 1  Language-  follow 3 step command 3 3  Language- read & follow direction 1 1  Write a sentence 1 1  Copy design 1 1  Total score 29 30        01/16/2024    8:19 AM 11/17/2022    8:40 AM 10/12/2020    8:32 AM  6CIT Screen  What Year? 0 points 0 points 0 points  What month? 0 points 0 points 0 points  What time? 0 points 0 points 0 points  Count back from 20 0 points 0 points 0 points  Months in reverse 0 points 0 points 2 points  Repeat phrase 2 points 2 points 2 points  Total Score 2 points 2 points 4 points    Immunizations Immunization History  Administered Date(s) Administered   Fluad Quad(high Dose 65+) 10/02/2019   Fluad Trivalent(High Dose 65+) 07/05/2023   Influenza Split 09/11/2012   Influenza Whole 06/22/2009   Influenza, High Dose Seasonal PF 07/06/2017, 08/01/2018   Influenza-Unspecified 07/07/2021   PFIZER(Purple Top)SARS-COV-2 Vaccination 10/18/2019, 11/13/2019, 05/28/2020   Pneumococcal Conjugate-13 11/18/2015    Pneumococcal Polysaccharide-23 08/23/2003, 07/06/2017   Tdap 07/10/2012    TDAP status: Due, Education has been provided regarding the importance of this vaccine. Advised may receive this vaccine at local pharmacy or Health Dept. Aware to provide a copy of the vaccination record if obtained from local pharmacy or Health Dept. Verbalized acceptance and understanding.  Flu Vaccine status: Up to date  Pneumococcal vaccine status: Up to date  Covid-19 vaccine status: Declined, Education has been provided regarding the importance of this vaccine but patient still declined. Advised may receive this vaccine at local pharmacy or Health Dept.or vaccine clinic. Aware to provide a copy of the vaccination record if obtained from local pharmacy or Health Dept. Verbalized acceptance and understanding.  Qualifies for Shingles Vaccine? Yes   Zostavax completed No   Shingrix Completed?: Yes  Screening Tests Health Maintenance  Topic Date Due   Hepatitis C Screening  Never done   DEXA SCAN  03/16/2019   DTaP/Tdap/Td (2 - Td or Tdap) 07/10/2022   MAMMOGRAM  10/22/2022   COVID-19 Vaccine (4 - 2024-25 season) 02/01/2024 (Originally 04/23/2023)   Zoster Vaccines- Shingrix (1 of 2) 04/17/2024 (Originally 09/10/1969)   Colonoscopy  01/15/2025 (Originally 12/18/2023)   INFLUENZA VACCINE  03/22/2024   Medicare Annual Wellness (AWV)  01/15/2025   Pneumonia Vaccine 70+ Years old  Completed   HPV VACCINES  Aged Out   Meningococcal B Vaccine  Aged Out    Health Maintenance  Health Maintenance Due  Topic Date Due   Hepatitis C Screening  Never done   DEXA SCAN  03/16/2019   DTaP/Tdap/Td (2 - Td or Tdap) 07/10/2022   MAMMOGRAM  10/22/2022    Colorectal cancer screening: Type of screening: Colonoscopy. Completed 2024. Repeat every 1 years    Declined  Mammogram status: Ordered  . Pt provided with contact info and advised to call to schedule appt.   Bone Density  patient stated her Endocrinologist willorder    Lung Cancer Screening: (Low Dose CT Chest recommended if Age 32-80 years, 20 pack-year currently smoking OR have quit w/in 15years.) does not qualify.   Lung Cancer Screening Referral:   Additional Screening:  Hepatitis C Screening  never done  Vision Screening: Recommended annual ophthalmology exams for early detection of glaucoma and other disorders of the eye. Is the patient up to date with their annual eye exam?  Yes  Who is the provider or  what is the name of the office in which the patient attends annual eye exams? Unsure of name  If pt is not established with a provider, would they like to be referred to a provider to establish care? No .   Dental Screening: Recommended annual dental exams for proper oral hygiene   Community Resource Referral / Chronic Care Management: CRR required this visit?  No   CCM required this visit?  No     Plan:     I have personally reviewed and noted the following in the patient's chart:   Medical and social history Use of alcohol , tobacco or illicit drugs  Current medications and supplements including opioid prescriptions. Patient is not currently taking opioid prescriptions. Functional ability and status Nutritional status Physical activity Advanced directives List of other physicians Hospitalizations, surgeries, and ER visits in previous 12 months Vitals Screenings to include cognitive, depression, and falls Referrals and appointments  In addition, I have reviewed and discussed with patient certain preventive protocols, quality metrics, and best practice recommendations. A written personalized care plan for preventive services as well as general preventive health recommendations were provided to patient.     Kieth Pelt, LPN   [id number]   After Visit Summary: (MyChart) Due to this being a telephonic visit, the after visit summary with patients personalized plan was offered to patient via MyChart   Nurse Notes:

## 2024-01-16 NOTE — Patient Instructions (Signed)
 Kaitlyn Adams , Thank you for taking time to come for your Medicare Wellness Visit. I appreciate your ongoing commitment to your health goals. Please review the following plan we discussed and let me know if I can assist you in the future.   Screening recommendations/referrals: Colonoscopy: Education provided Mammogram: ordered Bone Density: Education provided Recommended yearly ophthalmology/optometry visit for glaucoma screening and checkup Recommended yearly dental visit for hygiene and checkup  Vaccinations: Influenza vaccine: up to date Pneumococcal vaccine: up to date Tdap vaccine: Education provided Shingles vaccine: Education provided      Preventive Care 65 Years and Older, Female Preventive care refers to lifestyle choices and visits with your health care provider that can promote health and wellness. What does preventive care include? A yearly physical exam. This is also called an annual well check. Dental exams once or twice a year. Routine eye exams. Ask your health care provider how often you should have your eyes checked. Personal lifestyle choices, including: Daily care of your teeth and gums. Regular physical activity. Eating a healthy diet. Avoiding tobacco and drug use. Limiting alcohol  use. Practicing safe sex. Taking low-dose aspirin  every day. Taking vitamin and mineral supplements as recommended by your health care provider. What happens during an annual well check? The services and screenings done by your health care provider during your annual well check will depend on your age, overall health, lifestyle risk factors, and family history of disease. Counseling  Your health care provider may ask you questions about your: Alcohol  use. Tobacco use. Drug use. Emotional well-being. Home and relationship well-being. Sexual activity. Eating habits. History of falls. Memory and ability to understand (cognition). Work and work Astronomer. Reproductive  health. Screening  You may have the following tests or measurements: Height, weight, and BMI. Blood pressure. Lipid and cholesterol levels. These may be checked every 5 years, or more frequently if you are over 18 years old. Skin check. Lung cancer screening. You may have this screening every year starting at age 41 if you have a 30-pack-year history of smoking and currently smoke or have quit within the past 15 years. Fecal occult blood test (FOBT) of the stool. You may have this test every year starting at age 61. Flexible sigmoidoscopy or colonoscopy. You may have a sigmoidoscopy every 5 years or a colonoscopy every 10 years starting at age 20. Hepatitis C blood test. Hepatitis B blood test. Sexually transmitted disease (STD) testing. Diabetes screening. This is done by checking your blood sugar (glucose) after you have not eaten for a while (fasting). You may have this done every 1-3 years. Bone density scan. This is done to screen for osteoporosis. You may have this done starting at age 4. Mammogram. This may be done every 1-2 years. Talk to your health care provider about how often you should have regular mammograms. Talk with your health care provider about your test results, treatment options, and if necessary, the need for more tests. Vaccines  Your health care provider may recommend certain vaccines, such as: Influenza vaccine. This is recommended every year. Tetanus, diphtheria, and acellular pertussis (Tdap, Td) vaccine. You may need a Td booster every 10 years. Zoster vaccine. You may need this after age 38. Pneumococcal 13-valent conjugate (PCV13) vaccine. One dose is recommended after age 61. Pneumococcal polysaccharide (PPSV23) vaccine. One dose is recommended after age 30. Talk to your health care provider about which screenings and vaccines you need and how often you need them. This information is not intended to replace  advice given to you by your health care provider.  Make sure you discuss any questions you have with your health care provider. Document Released: 09/04/2015 Document Revised: 04/27/2016 Document Reviewed: 06/09/2015 Elsevier Interactive Patient Education  2017 [employer].  Fall Prevention in the Home Falls can cause injuries. They can happen to people of all ages. There are many things you can do to make your home safe and to help prevent falls. What can I do on the outside of my home? Regularly fix the edges of walkways and driveways and fix any cracks. Remove anything that might make you trip as you walk through a door, such as a raised step or threshold. Trim any bushes or trees on the path to your home. Use bright outdoor lighting. Clear any walking paths of anything that might make someone trip, such as rocks or tools. Regularly check to see if handrails are loose or broken. Make sure that both sides of any steps have handrails. Any raised decks and porches should have guardrails on the edges. Have any leaves, snow, or ice cleared regularly. Use sand or salt on walking paths during winter. Clean up any spills in your garage right away. This includes oil or grease spills. What can I do in the bathroom? Use night lights. Install grab bars by the toilet and in the tub and shower. Do not use towel bars as grab bars. Use non-skid mats or decals in the tub or shower. If you need to sit down in the shower, use a plastic, non-slip stool. Keep the floor dry. Clean up any water that spills on the floor as soon as it happens. Remove soap buildup in the tub or shower regularly. Attach bath mats securely with double-sided non-slip rug tape. Do not have throw rugs and other things on the floor that can make you trip. What can I do in the bedroom? Use night lights. Make sure that you have a light by your bed that is easy to reach. Do not use any sheets or blankets that are too big for your bed. They should not hang down onto the floor. Have a  firm chair that has side arms. You can use this for support while you get dressed. Do not have throw rugs and other things on the floor that can make you trip. What can I do in the kitchen? Clean up any spills right away. Avoid walking on wet floors. Keep items that you use a lot in easy-to-reach places. If you need to reach something above you, use a strong step stool that has a grab bar. Keep electrical cords out of the way. Do not use floor polish or wax that makes floors slippery. If you must use wax, use non-skid floor wax. Do not have throw rugs and other things on the floor that can make you trip. What can I do with my stairs? Do not leave any items on the stairs. Make sure that there are handrails on both sides of the stairs and use them. Fix handrails that are broken or loose. Make sure that handrails are as long as the stairways. Check any carpeting to make sure that it is firmly attached to the stairs. Fix any carpet that is loose or worn. Avoid having throw rugs at the top or bottom of the stairs. If you do have throw rugs, attach them to the floor with carpet tape. Make sure that you have a light switch at the top of the stairs and the bottom  of the stairs. If you do not have them, ask someone to add them for you. What else can I do to help prevent falls? Wear shoes that: Do not have high heels. Have rubber bottoms. Are comfortable and fit you well. Are closed at the toe. Do not wear sandals. If you use a stepladder: Make sure that it is fully opened. Do not climb a closed stepladder. Make sure that both sides of the stepladder are locked into place. Ask someone to hold it for you, if possible. Clearly mark and make sure that you can see: Any grab bars or handrails. First and last steps. Where the edge of each step is. Use tools that help you move around (mobility aids) if they are needed. These include: Canes. Walkers. Scooters. Crutches. Turn on the lights when you  go into a dark area. Replace any light bulbs as soon as they burn out. Set up your furniture so you have a clear path. Avoid moving your furniture around. If any of your floors are uneven, fix them. If there are any pets around you, be aware of where they are. Review your medicines with your doctor. Some medicines can make you feel dizzy. This can increase your chance of falling. Ask your doctor what other things that you can do to help prevent falls. This information is not intended to replace advice given to you by your health care provider. Make sure you discuss any questions you have with your health care provider. Document Released: 06/04/2009 Document Revised: 01/14/2016 Document Reviewed: 09/12/2014 Elsevier Interactive Patient Education  2017 [employer].

## 2024-01-17 ENCOUNTER — Encounter (HOSPITAL_BASED_OUTPATIENT_CLINIC_OR_DEPARTMENT_OTHER)

## 2024-01-17 DIAGNOSIS — D509 Iron deficiency anemia, unspecified: Secondary | ICD-10-CM

## 2024-01-17 DIAGNOSIS — F419 Anxiety disorder, unspecified: Secondary | ICD-10-CM | POA: Diagnosis not present

## 2024-01-17 DIAGNOSIS — R413 Other amnesia: Secondary | ICD-10-CM | POA: Diagnosis not present

## 2024-01-17 DIAGNOSIS — F32A Depression, unspecified: Secondary | ICD-10-CM

## 2024-01-17 NOTE — Progress Notes (Signed)
 Behavioral Observations: The patient appeared well-groomed and appropriately dressed. Her manners were polite and appropriate to the situation. The patient's attitude towards testing was positive and her effort was good.   Neuropsychology Note  Kaitlyn Adams completed 120 minutes of neuropsychological testing with technician, Rhett Cella, BA, under the supervision of Chapman Commodore, PsyD., Clinical Neuropsychologist. The patient did not appear overtly distressed by the testing session, per behavioral observation or via self-report to the technician. Rest breaks were offered.   Clinical Decision Making: In considering the patient's current level of functioning, level of presumed impairment, nature of symptoms, emotional and behavioral responses during clinical interview, level of literacy, and observed level of motivation/effort, a battery of tests was selected by Dr. Cheryll Corti during initial consultation on 01/11/2024. This was communicated to the technician. Communication between the neuropsychologist and technician was ongoing throughout the testing session and changes were made as deemed necessary based on patient performance on testing, technician observations and additional pertinent factors such as those listed above.  Tests Administered: Controlled Oral Word Association Test (COWAT; FAS & Animals)  Grooved Pegboard Wechsler Adult Intelligence Scale, 4th Edition (WAIS-IV) Wechsler Memory Scale, 4th Edition (WMS-IV); Older Adult Battery   Results:  COWAT:  **On FAS portion, patient kept switching back to the target letter from the previous trial despite repeated prompting (for example, producing words with the letter F on the trial for letter A).  FAS total= 35 Z= 0.02 Animals total= 11 Z= -1.28  Grooved Pegboard:  R (DH) time= 167s Drops= 1 Percentile Rank= 19th L (NDH) time= 300s (D/C)  Drops= 1 Percentile Rank= 20th  WAIS-IV:   Composite Score Summary  Scale  Sum of Scaled Scores Composite Score Percentile Rank 95% Conf. Interval Qualitative Description  Verbal Comprehension 30 VCI 100 50 94-106 Average  Perceptual Reasoning 20 PRI 81 10 76-88 Low Average  Working Memory 18 WMI 95 37 89-102 Average  Processing Speed 12 PSI 79 8 73-89 Borderline  Full Scale 80 FSIQ 86 18 82-90 Low Average  General Ability 50 GAI 89 23 84-94 Low Average   Verbal Comprehension Subtests Summary  Subtest Raw Score Scaled Score Percentile Rank Reference Group Scaled Score SEM  Similarities 26 11 63 10 0.95  Vocabulary 39 11 63 11 0.67  Information 10 8 25 8  0.73  The scaled scores in the Reference Group Scaled Score column are based on the performance of examinees aged 20:0-34:11 (i.e., the reference group). See Chapter 6 of the WAIS-IV Technical and Interpretive Manual for more information.  Perceptual Reasoning Subtests Summary  Subtest Raw Score Scaled Score Percentile Rank Reference Group Scaled Score SEM  Block Design 20 7 16 5  0.99  Matrix Reasoning 7 7 16 3  0.90  Visual Puzzles 6 6 9 4  0.99   Working Librarian, academic Raw Score Scaled Score Percentile Rank Reference Group Scaled Score SEM  Digit Span 20 8 25 6  0.73  Arithmetic 13 10 50 9 1.20   Processing Speed Subtests Summary  Subtest Raw Score Scaled Score Percentile Rank Reference Group Scaled Score SEM  Symbol Search 10 4 2 2  1.12  Coding 39 8 25 4  1.12    WMS-IV: **Note for visual reproduction II: on delayed condition, the patient began drawing everyday objects (e.g house, bed) rather than producing the target figures.  Index Score Summary  Index Sum of Scaled Scores Index Score Percentile Rank 95% Confidence Interval Qualitative Descriptor  Auditory Memory (AMI) 25 78 7 73-85 Borderline  Visual Memory (VMI) 5 54 0.1 50-60 Extremely Low  Immediate Memory (IMI) 17 73 4 68-81 Borderline  Delayed Memory (DMI) 13 65 1 60-75 Extremely Low    Primary Subtest Scaled Score  Summary  Subtest Domain Raw Score Scaled Score Percentile Rank  Logical Memory I AM 20 6 9   Logical Memory II AM 5 4 2   Verbal Paired Associates I AM 13 7 16   Verbal Paired Associates II AM 4 8 25   Visual Reproduction I VM 18 4 2   Visual Reproduction II VM 0 1 0.1  Symbol Span VWM 8 6 9    Auditory Memory Process Score Summary  Process Score Raw Score Scaled Score Percentile Rank Cumulative Percentage (Base Rate)  LM II Recognition 19 - - 51-75%  VPA II Recognition 24 - - 10-16%   Visual Memory Process Score Summary  Process Score Raw Score Scaled Score Percentile Rank Cumulative Percentage (Base Rate)  VR II Recognition 2 - - 10-16%   ABILITY-MEMORY ANALYSIS  Ability Score:  VCI: 100 Date of Testing:  WAIS-IV; WMS-IV 2024/01/17  Predicted Difference Method   Index Predicted WMS-IV Index Score Actual WMS-IV Index Score Difference Critical Value  Significant Difference Y/N Base Rate  Auditory Memory 100 78 22 10.41 Y 5%  Visual Memory 100 54 46 7.35 Y <1%  Immediate Memory 100 73 27 9.69 Y 1-2%  Delayed Memory 100 65 35 12.22 Y <1%  Statistical significance (critical value) at the .01 level.    Feedback to Patient: Kaitlyn Adams will return on 06/20/2024 for an interactive feedback session with Dr. Cheryll Corti at which time her test performances, clinical impressions and treatment recommendations will be reviewed in detail. The patient understands she can contact our office should she require our assistance before this time.  120 minutes spent face-to-face with patient administering standardized tests, 30 minutes spent scoring Radiographer, therapeutic). [CPT [id number], [id number]]  Full report to follow.

## 2024-01-23 ENCOUNTER — Encounter: Attending: Psychology | Admitting: Psychology

## 2024-01-23 ENCOUNTER — Encounter: Payer: Self-pay | Admitting: Psychology

## 2024-01-23 DIAGNOSIS — F02A0 Dementia in other diseases classified elsewhere, mild, without behavioral disturbance, psychotic disturbance, mood disturbance, and anxiety: Secondary | ICD-10-CM | POA: Insufficient documentation

## 2024-01-23 DIAGNOSIS — D509 Iron deficiency anemia, unspecified: Secondary | ICD-10-CM | POA: Insufficient documentation

## 2024-01-23 DIAGNOSIS — F32A Depression, unspecified: Secondary | ICD-10-CM | POA: Diagnosis not present

## 2024-01-23 DIAGNOSIS — R413 Other amnesia: Secondary | ICD-10-CM | POA: Insufficient documentation

## 2024-01-23 DIAGNOSIS — F419 Anxiety disorder, unspecified: Secondary | ICD-10-CM | POA: Insufficient documentation

## 2024-01-23 DIAGNOSIS — G309 Alzheimer's disease, unspecified: Secondary | ICD-10-CM | POA: Diagnosis not present

## 2024-01-23 NOTE — Progress Notes (Signed)
 Neuropsychological Evaluation   Patient:  Kaitlyn Adams   DOB: [birth year]  MR Number: [id number]  Location: [facility] [place] [facility] [address] [place], [address] [place] [address] Dept: [phone]  Start: 11 AM End: 12 PM  Provider/Observer:     Marrion Sjogren PsyD  Chief Complaint:      Chief Complaint  Patient presents with   Memory Loss    Reason For Service:      Kaitlyn Adams is a 73 year old female referred for neuropsychological evaluation by her primary care provider Kaitlyn Priest, MD due to reports of increasing cognitive difficulties related to memory change.  Patient has been dealing with a number of medical issues and has been recently hospitalized due to significant anemia requiring blood transfusion, fatigue and renal insufficiency.  Patient also with diagnosis of Addison disease, glaucoma, hyperlipidemia, hypertension, hypothyroidism, mild CAD, and anemia.  Patient is being followed by oncology for her anemia.  Patient and husband note that memory difficulties started between 6 and 12 months ago and the initial changes were around the times of her acute anemia with hospitalization.  Patient is noted to be improving with regard to her blood work and has gone from being too weak to even walk to significant improvements in ambulation and strength.  However, the patient has been note that there continue to be some memory difficulties particularly for semantic type memories.  Patient's husband reports that the patient will ask a question and after it has been answered she will ask the same question again 15 minutes later.  Patient has noted to continue with weakness and balance issues.  Patient and husband deny any change in geographic orientation, changes in expressive or receptive language capacity or other cognitive changes.  The patient and husband describe gradual changes after  the initial change during her medical illness and for the most part it is stabilized over the past couple of months with no progressive change noted.   Patient and husband both deny any tremors, visual or auditory hallucinations or delusions.  Patient has had CT scan of brain done recently that identified no acute process such as infarct or hemorrhage and no identified abnormality noted.  Patient has not had an MRI of brain.  Lab work over the past several months have showed improvements in white blood cell count (21.0-9.1) and improvements in red blood cell count (current 4.56).  Folate and B12 studies were within normal limits.   The patient reports that both of her parents developed memory and cognitive difficulties in their 60s.  The patient reports that her mother started having progressive memory loss that progressed relatively quickly and starting around 74 years of age and coincided with significant physical decline and the patient's mother was placed in skilled nursing facility.  The patient's father developed progressive cognitive decline that progressed relatively quickly but he also had cardiovascular illness and had open heart surgery.  His change started between age 22 and 64.   Patient was started on donezepil by her PCP because of complaints of memory difficulties without descriptions of any side effects.   Medical History:                         Past Medical History:  Diagnosis Date   Addison disease (HCC)     Anemia     Asthma      - normal spirometry 2009  FEV1  >90% predicted.  - methacoline challenge neg 11/09   Bronchitis      recurrent   Cataract     Chronic cough      - sinus CT 03-23-10 > neg.  - allergy  profile 03-23-10 > nl, IgE 14.  - flutter valve rx 03-23-10   Fibromyalgia     GERD (gastroesophageal reflux disease)      exacerbating VCD   Glaucoma     History of blood transfusion     Hyperlipidemia     Hypertension     Hyperthyroidism      with hot nodule.  -  total thyroidectomy 11-05-08 benign -> subsequent hypothyroidism.   Hypokalemia     Hypothyroidism     Hypothyroidism      a. following thyroidectomy.   Mild CAD      a. LHC 04/2015 - mild nonobstructive CAD with 30% dLAD, 40% D1, 25% pLCx, 30% mRCA, 30% RPDA, normal EF >65% with normal LVEDP.   Orthostatic hypotension     Paroxysmal atrial fibrillation (HCC)     Renal insufficiency     Vasomotor rhinitis      exacerbates VCD   Vocal cord dysfunction      proven on FOB 9/09                                                               Patient Active Problem List    Diagnosis Date Noted   Elevated troponin 12/20/2023   RBBB 12/20/2023   Sinus pause 12/20/2023   Adrenal insufficiency (Addison's disease) (HCC) 12/20/2023   AKI (acute kidney injury) (HCC) 12/18/2023   Nausea and vomiting 12/18/2023   Frequent falls 02/07/2023   Candida esophagitis (HCC) 12/18/2022   Benign neoplasm of ascending colon 12/18/2022   Benign neoplasm of transverse colon 12/18/2022   Anal stricture 12/18/2022   CKD (chronic kidney disease) stage 3, GFR 30-59 ml/min (HCC) 12/17/2022   Adrenal insufficiency (HCC) 11/06/2020   Disorder of bone 11/06/2020   Disorder of calcium  metabolism 11/06/2020   Vitamin D  deficiency 11/06/2020   Hypokalemia 10/25/2018   Moderate protein-calorie malnutrition (HCC) 08/01/2018   Upper airway cough syndrome 05/22/2017   Symptomatic anemia 04/26/2017   Chronic constipation 07/07/2016   History of colonic polyps 07/07/2016   Chronic anticoagulation 07/07/2016   Iron deficiency anemia 06/15/2016   Anxiety and depression 05/18/2016   Physical exam 11/18/2015   Pain in joint, ankle and foot 11/16/2015   PAT (paroxysmal atrial tachycardia) (HCC) 09/08/2015   Hyperlipidemia 07/30/2015   CAD (coronary artery disease)     Chest pain of uncertain etiology 05/15/2015   Chronic bronchitis (HCC) 06/17/2011   Orthostatic hypotension 12/06/2010   PAF (paroxysmal atrial  fibrillation) (HCC)     Renal insufficiency     Myalgia     Hypothyroidism     Glaucoma     GERD, SEVERE 08/19/2010   Osteoarthritis 04/17/2008   FIBROMYALGIA 04/17/2008    Current Typical Mood State:  Appropriate   Sleep: Patient reports that she has times with difficult sleep patterns.  However, she also does fairly well with sleep at other times.  Patient reports that she typically goes to bed between 930 and 10 and will usually fall asleep in 5 or 10 minutes.  Patient reports that at other times she will be until midnight.  Patient reports that she sleeps till 6 AM but husband notes that there are times where she will stay in bed for extended periods of time due to her fatigue.   Diet Pattern: Patient reports good appetite but she does not always eat of well-rounded health diet due to not having a reason to cook large meals for just the patient and her husband.  I did encourage the patient to be more thoughtful and eating vegetables and other whole foods.    Tests Administered: Controlled Oral Word Association Test (COWAT; FAS & Animals)  Grooved Pegboard Wechsler Adult Intelligence Scale, 4th Edition (WAIS-IV) Wechsler Memory Scale, 4th Edition (WMS-IV); Older Adult Battery   Participation Level:   Active  Participation Quality:  Appropriate      Behavioral Observation:  The patient appeared well-groomed and appropriately dressed. Her manners were polite and appropriate to the situation. The patient's attitude towards testing was positive and her effort was good.     Well Groomed, Alert, and Appropriate.   Test Results:   Initially, an estimation was made as to the patient's premorbid intellectual and cognitive functioning looking at various psychosocial features.  The patient was born and raised in [place]  with normal childhood illnesses noted and no significant developmental delays noted.  Patient graduated from high school having relative skills in math and  relative weakness and Latin/language classes.  Patient never repeated any grades.  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  It is conservatively estimated that the patient likely functioned in the average to high average range relative to a normative population and we will utilize a conservative estimate of average performance relative to normative population for comparison purposes.  Secondly, an estimation was made as to the validity of the current assessment protocols.  Behavioral observations both during the clinical interview as well as formal testing visit all suggested that the patient approached these procedures in an honest straightforward manner providing good effort and maintaining good arousal throughout the testing procedures.  Embedded validity checks including elements from the digit span measure as well as forced choice types of challenges all were within normative expectations and not indicative of purposeful attempts to miss items or exaggerate any weaknesses.  This does appear to be a fair and valid assessment.   COWAT:  **On FAS portion, patient kept switching back to the target letter from the previous trial despite repeated prompting (for example, producing words with the letter F on the trial for letter A).  FAS total= 35 Z= 0.02 Animals total= 11 Z= -1.28   While the patient and husband both deny any changes in expressive language functioning as part of a thorough evaluation she was administered the control oral Word Association test.  While the patient did have some difficulties on the FAS test she produced a score that was within normal limits without indicative patterns of lexical fluency deficits.  The patient had a little bit more difficult on semantic language production under the animal naming test where she was little bit more than 1 standard  deviation below her age, education and gender comparison group norms.  There was no indication during the formal clinical interview or behavioral observations during the testing procedures that would suggest any significant expressive or receptive language changes.  Grooved Pegboard:  [facility]) time= 167s  Drops= 1 Percentile Rank= 19th L (NDH) time= 300s (D/C)  Drops= 1 Percentile Rank= discontinued at 300 seconds  The patient was also administered the grooved pegboard test to provide an assessment of fine motor control in both her right dominant hand and left nondominant hand.  While the patient showed some fine motor control issues particularly noted dropping a key 1 occasion on both left and right hand performance her overall performance was showed only mild weaknesses with regard to her right dominant hand but the patient had much more difficulty with this task for her left nondominant hand in the task was discontinued at 300 seconds.  This does suggest some possible lateralization but I suspect that it has more to do with longstanding fine motor control issues with her nondominant hand.   WAIS-IV:              Composite Score Summary          Scale Sum of Scaled Scores Composite Score Percentile Rank 95% Conf. Interval Qualitative Description  Verbal Comprehension 30 VCI 100 50 94-106 Average  Perceptual Reasoning 20 PRI 81 10 76-88 Low Average  Working Memory 18 WMI 95 37 89-102 Average  Processing Speed 12 PSI 79 8 73-89 Borderline  Full Scale 80 FSIQ 86 18 82-90 Low Average  General Ability 50 GAI 89 23 84-94 Low Average    In order to provide a thorough assessment of a wide range of cognitive domains and a highly structured/normed collection of measures that is able to be repeated the patient was administered the Wechsler Adult Intelligence Scale-IV.  As the patient and husband are describing cognitive changes primarily around memory changes her current score should not be seen as  indicative of her lifelong status but a reflection of her current status.  2 Global composite scores were calculated.  The patient produced a full-scale IQ score of 86 which falls at the 18th percentile and is in the low average range relative to a normative population.  This is at least 1 standard deviation below predicted levels of premorbid functioning.  Similar performance was noted on the general abilities index score where she produced a composite score of 89 and fell at the 23rd percentile and also in the low average range.  This would suggest that difficulties are not simply related to attention and concentration variables.  Individual composite scores show that the patient is maintaining verbal based skills and verbal comprehension skills as well as maintaining her auditory encoding capacities.  The patient's weaknesses have to do with changes or reduced performances below predicted levels of premorbid functioning related to visual-spatial reasoning and visual problem-solving as well as information processing speed and overall speed of mental operations.            Verbal Comprehension Subtests Summary        Subtest Raw Score Scaled Score Percentile Rank Reference Group Scaled Score SEM  Similarities 26 11 63 10 0.95  Vocabulary 39 11 63 11 0.67  Information 10 8 25 8  0.73   The patient produced verbal comprehension index score of 100 which falls at the 50th percentile and is in the average range relative to normative population.  There was little scatter noted in subtest performance with the patient doing well on verbal reasoning and problem-solving challenges and her vocabulary knowledge.  The patient did have a relative weakness with regard to her general fund of information but this likely represents her focus and very practical types of skills  with her work through her family's business.   Perceptual Reasoning Subtests Summary        Subtest Raw Score Scaled Score Percentile Rank  Reference Group Scaled Score SEM  Block Design 20 7 16 5  0.99  Matrix Reasoning 7 7 16 3  0.90  Visual Puzzles 6 6 9 4  0.99   The patient produced a perceptual reasoning index score of 81 which falls at the 10th percentile and is in the low average range relative to normative population.  There was significant weakness with regard to her visual analysis and organizational skills, her capacity to perform part-whole recognition skills, nonverbal reasoning and problem-solving and perceptual organizational weaknesses as well as difficulties with fluid visual reasoning capacity.  The scores were consistent within subtest performance and are consistent with visual-spatial and visual processing changes.  Working Comptroller Raw Score Scaled Score Percentile Rank Reference Group Scaled Score SEM  Digit Span 20 8 25 6  0.73  Arithmetic 13 10 50 9 1.20   The patient did well on measures of auditory encoding as well as her capacity to actively process information or auditory Register.  There were no indications of significant issues with auditory encoding and the level of performance should not have a significant deleterious impact on the patient's ability to learn new information auditorily.  Processing Speed Subtests Summary        Subtest Raw Score Scaled Score Percentile Rank Reference Group Scaled Score SEM  Symbol Search 10 4 2 2  1.12  Coding 39 8 25 4  1.12    The patient produced a processing speed index score of 79 which falls at the 8th percentile and is in the borderline range relative to normative population.  This is a significant weakness in this area although there was some variability within subtest performances.  Overall, there were indications of slowed information processing speed particularly for visual scanning and visual searching types of challenges.  These levels of changes in focus execute attentional types of capacity are at a level that would have some  negative impact on day-to-day functioning and could have an impact on a person's capacity for semantic memory as language rates may exceed information processing speed capacities.   WMS-IV: **Note for visual reproduction II: on delayed condition, the patient began drawing everyday objects (e.g house, bed) rather than producing the target figures.           Index Score Summary        Index Sum of Scaled Scores Index Score Percentile Rank 95% Confidence Interval Qualitative Descriptor  Auditory Memory (AMI) 25 78 7 73-85 Borderline  Visual Memory (VMI) 5 54 0.1 50-60 Extremely Low  Immediate Memory (IMI) 17 73 4 68-81 Borderline  Delayed Memory (DMI) 13 65 1 60-75 Extremely Low    In order to provide an objective assessment of a wide range of attention and memory capacities in a structured way the patient was administered the Wechsler Memory Scale-IV in its entirety.  On the Wechsler Adult Intelligence Scale the patient performed in the average range for auditory encoding capacity.  However, the patient had some much greater difficulties with regard to visual encoding measures and her encoding and processing information in her visual Register were impaired and significantly below predicted levels of premorbid functioning.  While the patient did better on auditory memory versus visual memory she showed significant impairments in both.  The patient produced an auditory memory index score of  78 which falls at the 7th percentile and is in the borderline range relative to normative population.  This is significantly below what her auditory encoding capacity should allow for and would be indicative of difficulties with storage and organizational capacity around auditory information rather than simply being a pattern related to auditory encoding deficits.  While the patient showed more weakness with regard to visual encoding capacity she showed significant impairments for visual memory as she produced a  visual memory index score of 54, which fell below the 1st percentile and in the extremely low range relative to a normative population.  The patient clearly shows difficulties with storage and organization of visual information with visual memory deficits greater than auditory memory deficits.  The patient produced an immediate memory index score of 74 which falls at the 4th percentile in the borderline range relative to normative population and much more than 1 standard deviation below predicted levels of premorbid functioning.  The patient had even more difficulty on the delayed memory index where she produced a index score of 65 falling at the 1st percentile relative to normative population.  Looking at individual subtest making up these indices the patient shows difficulties with story learning as well as verbal paired associate type learning and difficulties with visual-spatial visual learning capacities.  The patient does not show any particular significant improvement for certain aspects of auditory learning and clearly no improvements for visual learning under cued/recognition formats.         Primary Subtest Scaled Score Summary       Subtest Domain Raw Score Scaled Score Percentile Rank  Logical Memory I AM 20 6 9   Logical Memory II AM 5 4 2   Verbal Paired Associates I AM 13 7 16   Verbal Paired Associates II AM 4 8 25   Visual Reproduction I VM 18 4 2   Visual Reproduction II VM 0 1 0.1  Symbol Span VWM 8 6 9           Auditory Memory Process Score Summary      Process Score Raw Score Scaled Score Percentile Rank Cumulative Percentage (Base Rate)  LM II Recognition 19 - - 51-75%  VPA II Recognition 24 - - 10-16%         Visual Memory Process Score Summary      Process Score Raw Score Scaled Score Percentile Rank Cumulative Percentage (Base Rate)  VR II Recognition 2 - - 10-16%    ABILITY-MEMORY ANALYSIS   Ability Score:    VCI: 100 Date of Testing:           WAIS-IV; WMS-IV  2024/01/17             Predicted Difference Method    Index Predicted WMS-IV Index Score Actual WMS-IV Index Score Difference Critical Value   Significant Difference Y/N Base Rate  Auditory Memory 100 78 22 10.41 Y 5%  Visual Memory 100 54 46 7.35 Y <1%  Immediate Memory 100 73 27 9.69 Y 1-2%  Delayed Memory 100 65 35 12.22 Y <1%   Summary of Results:   The results of the current objective assessment of a wide range of cognitive domains are consistent with significant differences from premorbid estimates of cognitive functioning in the areas of visual-spatial and visual constructional capacities, visual reasoning and problem-solving capacities, slowed information processing speed/focus execute abilities and weaknesses with regard to visual encoding and her capacity to actively process information in her visual Register.  Also, there were clear deficits for learning  new information.  The patient showed greater weaknesses and deficits for visual memory versus auditory memory but her weakness or visual encoding likely accounted for this difference.  There were significant deficits in her capacity to store and organize new auditory and visual information and significant amount of information was lost after period of delay.  The patient really did not show significant improvements in memory under recognition/cued recall suggesting that the primary memory deficit had to do with difficulties with storing and organizing new information rather than an inability to attend to information or an inability to retrieve information had actually been stored.  There was some motor weaknesses for nondominant hand greater than dominant hand but this may be a pre-existing status.  Impression/Diagnosis:   The results of the current neuropsychological evaluation do show consistencies between the patient and her husband subjective reports of cognitive change over the past 6 to 12 months and objective neuropsychological  test data.  Medical history includes acute anemia developing around the time they first noticed these memory changes.  However, her medical status and anemia has been addressed medically and has been improving and she is being followed by oncology for this issue.  The patient continues to have memory weaknesses and deficits even after these medical issues have been addressed and are improving.  The patient's primary cognitive weaknesses have to do with storage and organization/memory of new information both auditorily and visually as well as changes in attention particular around visual encoding and focus execute/information processing speed changes.  The patient is also showing significant changes from premorbid estimates with regard to visual-spatial and visual constructional capacity as well as visual reasoning and problem-solving capacities.  The patient is maintaining her language based skills and is also doing well on verbal based reasoning and problem-solving types of capacity.  As far as diagnostic considerations, the patient is showing objective evidence of cognitive change with reports from the patient and her husband that these developed roughly a year ago although I suspect there may have been having some issues even prior to that.  The acute medical issues around her anemia may have played some degree of identifying these difficulties but they continue to persist even after we have been at least adequately addressing her anemic status.  The patient does have a family history of progressive and rather rapid cognitive decline and family members although clear diagnostic information about her parents is not available.  Given the fact that this was a relatively acute onset identified by the patient and her husband the pattern is not completely consistent with an Alzheimer's type pathology.  While the patient is showing some pattern of adaptive confabulation and using logical strategies to try to predict  what she is not actually remembering, the patient and her husband deny any changes in geographic orientation, deny any tremors, visual hallucinations etc.  The pattern of cognitive strengths and weaknesses and clinical features are not consistent with a Lewy body type process but these cognitive changes are also not generally consistent are explained by any changes of sleep patterns and her extras extended periods of sleep may be due to her fatigue.  There were no reports of symptoms consistent with obstructive sleep apnea.  Given the presentation on formal neuropsychological test data and concerns about cognitive decline there will be a need to perform repeat testing in approximately 9 to 12 months for more definitive diagnostic considerations.  While there is not sufficient evidence to make a formal diagnosis of a major neurocognitive  disorder due to Alzheimer's type pathology this process cannot be completely ruled out but it would clearly be of a late onset type of presentation.  The patient's folate and B12 studies have been within normal limits recently so this particular issue is not likely playing a role.  It may be helpful in the meantime to have the patient complete the ATN profile blood work as well as considering genetic testing for the APOE-4 genotype mutation to aid in diagnostic considerations.  It may be also helpful to have a brain MRI conducted to assess for possible small vessel disease/microvascular ischemic change as well.  We will set up a time to repeat testing in the 9-40-month mark to provide more definitive diagnostic considerations.  At the present time, the patient would meet the criterion for mild/moderate memory loss and the patient also showing some changes in visual-spatial and information processing speed capacities.    Diagnosis:    Memory loss  Anxiety and depression  Iron deficiency anemia, unspecified iron deficiency anemia type   _____________________ Chapman Commodore, Psy.D. Clinical Neuropsychologist

## 2024-01-24 DIAGNOSIS — E271 Primary adrenocortical insufficiency: Secondary | ICD-10-CM | POA: Diagnosis not present

## 2024-01-24 DIAGNOSIS — A0811 Acute gastroenteropathy due to Norwalk agent: Secondary | ICD-10-CM | POA: Diagnosis not present

## 2024-01-24 DIAGNOSIS — I480 Paroxysmal atrial fibrillation: Secondary | ICD-10-CM | POA: Diagnosis not present

## 2024-01-24 DIAGNOSIS — N1831 Chronic kidney disease, stage 3a: Secondary | ICD-10-CM | POA: Diagnosis not present

## 2024-01-24 DIAGNOSIS — N179 Acute kidney failure, unspecified: Secondary | ICD-10-CM | POA: Diagnosis not present

## 2024-01-24 DIAGNOSIS — I951 Orthostatic hypotension: Secondary | ICD-10-CM | POA: Diagnosis not present

## 2024-01-25 ENCOUNTER — Encounter (HOSPITAL_BASED_OUTPATIENT_CLINIC_OR_DEPARTMENT_OTHER): Admitting: Psychology

## 2024-01-25 ENCOUNTER — Encounter: Payer: Self-pay | Admitting: Psychology

## 2024-01-25 DIAGNOSIS — F02A0 Dementia in other diseases classified elsewhere, mild, without behavioral disturbance, psychotic disturbance, mood disturbance, and anxiety: Secondary | ICD-10-CM | POA: Diagnosis not present

## 2024-01-25 DIAGNOSIS — F32A Depression, unspecified: Secondary | ICD-10-CM

## 2024-01-25 DIAGNOSIS — G309 Alzheimer's disease, unspecified: Secondary | ICD-10-CM | POA: Diagnosis not present

## 2024-01-25 DIAGNOSIS — R413 Other amnesia: Secondary | ICD-10-CM | POA: Diagnosis not present

## 2024-01-25 DIAGNOSIS — F419 Anxiety disorder, unspecified: Secondary | ICD-10-CM | POA: Diagnosis not present

## 2024-01-25 DIAGNOSIS — D509 Iron deficiency anemia, unspecified: Secondary | ICD-10-CM

## 2024-01-25 NOTE — Progress Notes (Signed)
 Neuropsychological Evaluation   Patient:  Kaitlyn Adams   DOB: [birth year]  MR Number: [id number]  Location: [facility] [address] [place], [address]  [place] [address] Dept: [phone]  Start: 10 AM End: 11 AM  Provider/Observer:     Marrion Sjogren PsyD  Chief Complaint:      Chief Complaint  Patient presents with   Memory Loss   Anxiety   Other    Changes in visual-spatial and visual constructional capacity   01/25/2024 10 AM-11 AM: Today I provided feedback regarding the results of the recent neuropsychological evaluation to the patient and her husband.  We reviewed the results of the recent neuropsychological evaluation and went over diagnostic considerations.  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  However, at this point this is not definitive in nature.  I have recommended to the patient and family that they consider doing an ATN profile and genetic profile to help with accuracy and it may be beneficial for the patient to have a neurology consultation.  The patient had a 1 month prescription for Aricept  that has not been renewed.  The patient and husband deny any specific side effects from this medication but it has not been refilled.  We have also scheduled the patient to return in approximately 9 months for repeat testing to provide a more definitive diagnostic picture.  I have included the reason for service below and the summary of the formal neuropsychological evaluation below for convenience and the full diagnostic evaluative report can be found in the patient's EMR dated 01/23/2024.   Reason For Service:      Kaitlyn Adams is a 73 year old female referred for neuropsychological evaluation by her primary care provider Laymon Priest, MD due to reports of increasing cognitive difficulties related to memory change.  Patient has been dealing with a number of medical issues and has been recently hospitalized due to significant anemia requiring blood transfusion, fatigue and renal insufficiency.  Patient also with diagnosis of Addison disease, glaucoma, hyperlipidemia, hypertension, hypothyroidism, mild CAD, and anemia.  Patient is being followed by oncology for her anemia.  Patient and husband note that memory difficulties started between 6 and 12 months ago and the initial changes were around the times of her acute anemia with hospitalization.  Patient is noted to be improving with regard to her blood work and has gone from being too weak to even walk to significant improvements in ambulation and strength.  However, the patient has been note that there continue to be some memory difficulties particularly for semantic type memories.  Patient's husband reports that the patient will ask a question and after it has been answered she will ask the same question again 15 minutes later.  Patient has noted to continue with weakness and balance issues.  Patient and husband deny any change in geographic orientation, changes in expressive or receptive language capacity or other cognitive changes.  The patient and husband describe gradual changes after the initial change during her medical illness and for the most part it is stabilized over the past couple of months with no progressive change noted.   Patient and husband both deny any tremors, visual or auditory hallucinations or delusions.  Patient has had CT scan of brain done recently that identified no acute  process such as infarct or hemorrhage and no identified abnormality noted.  Patient has not had an MRI of brain.  Lab work over the past several months have showed improvements in white blood cell count (21.0-9.1) and improvements in red blood cell count (current 4.56).  Folate and B12 studies  were within normal limits.   The patient reports that both of her parents developed memory and cognitive difficulties in their 75s.  The patient reports that her mother started having progressive memory loss that progressed relatively quickly and starting around 74 years of age and coincided with significant physical decline and the patient's mother was placed in skilled nursing facility.  The patient's father developed progressive cognitive decline that progressed relatively quickly but he also had cardiovascular illness and had open heart surgery.  His change started between age 42 and 88.   Patient was started on donezepil by her PCP because of complaints of memory difficulties without descriptions of any side effects.   Impression/Diagnosis:   The results of the current neuropsychological evaluation do show consistencies between the patient and her husband subjective reports of cognitive change over the past 6 to 12 months and objective neuropsychological test data.  Medical history includes acute anemia developing around the time they first noticed these memory changes.  However, her medical status and anemia has been addressed medically and has been improving and she is being followed by oncology for this issue.  The patient continues to have memory weaknesses and deficits even after these medical issues have been addressed and are improving.  The patient's primary cognitive weaknesses have to do with storage and organization/memory of new information both auditorily and visually as well as changes in attention particular around visual encoding and focus execute/information processing speed changes.  The patient is also showing significant changes from premorbid estimates with regard to visual-spatial and visual constructional capacity as well as visual reasoning and problem-solving capacities.  The patient is maintaining her language based skills and is also doing well on verbal based reasoning and  problem-solving types of capacity.  As far as diagnostic considerations, the patient is showing objective evidence of cognitive change with reports from the patient and her husband that these developed roughly a year ago although I suspect there may have been having some issues even prior to that.  The acute medical issues around her anemia may have played some degree of identifying these difficulties but they continue to persist even after we have been at least adequately addressing her anemic status.  The patient does have a family history of progressive and rather rapid cognitive decline and family members although clear diagnostic information about her parents is not available.  Given the fact that this was a relatively acute onset identified by the patient and her husband the pattern is not completely consistent with an Alzheimer's type pathology.  While the patient is showing some pattern of adaptive confabulation and using logical strategies to try to predict what she is not actually remembering, the patient and her husband deny any changes in geographic orientation, deny any tremors, visual hallucinations etc.  The pattern of cognitive strengths and weaknesses and clinical features are not consistent with a Lewy body type process but these cognitive changes are also not generally consistent are explained by any changes of sleep patterns and her extras extended periods of sleep may be due to her fatigue.  There were no reports of symptoms consistent with obstructive sleep apnea.  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  Given the presentation on formal neuropsychological test data and concerns about cognitive decline there will be a need to perform repeat testing in approximately 9 to 12 months for more  definitive diagnostic considerations.  While there is not sufficient evidence to make a formal diagnosis of a major neurocognitive disorder due to Alzheimer's type pathology this process cannot be completely ruled out but it would clearly be of a late onset type of presentation.  The patient's folate and B12 studies have been within normal limits recently so this particular issue is not likely playing a role.  It may be helpful in the meantime to have the patient complete the ATN profile blood work as well as considering genetic testing for the APOE-4 genotype mutation to aid in diagnostic considerations.  It may be also helpful to have a brain MRI conducted to assess for possible small vessel disease/microvascular ischemic change as well.  We will set up a time to repeat testing in the 9-71-month mark to provide more definitive diagnostic considerations.  At the present time, the patient would meet the criterion for mild/moderate memory loss and the patient also showing some changes in visual-spatial and information processing speed capacities.    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 Clinical Neuropsychologist

## 2024-01-29 DIAGNOSIS — R634 Abnormal weight loss: Secondary | ICD-10-CM | POA: Diagnosis not present

## 2024-01-29 DIAGNOSIS — Z9181 History of falling: Secondary | ICD-10-CM | POA: Diagnosis not present

## 2024-01-29 DIAGNOSIS — I480 Paroxysmal atrial fibrillation: Secondary | ICD-10-CM | POA: Diagnosis not present

## 2024-01-29 DIAGNOSIS — E039 Hypothyroidism, unspecified: Secondary | ICD-10-CM | POA: Diagnosis not present

## 2024-01-29 DIAGNOSIS — M47812 Spondylosis without myelopathy or radiculopathy, cervical region: Secondary | ICD-10-CM | POA: Diagnosis not present

## 2024-01-29 DIAGNOSIS — R413 Other amnesia: Secondary | ICD-10-CM | POA: Diagnosis not present

## 2024-01-29 DIAGNOSIS — E876 Hypokalemia: Secondary | ICD-10-CM | POA: Diagnosis not present

## 2024-01-29 DIAGNOSIS — K219 Gastro-esophageal reflux disease without esophagitis: Secondary | ICD-10-CM | POA: Diagnosis not present

## 2024-01-29 DIAGNOSIS — Z681 Body mass index (BMI) 19 or less, adult: Secondary | ICD-10-CM | POA: Diagnosis not present

## 2024-01-29 DIAGNOSIS — Z7951 Long term (current) use of inhaled steroids: Secondary | ICD-10-CM | POA: Diagnosis not present

## 2024-01-29 DIAGNOSIS — N1831 Chronic kidney disease, stage 3a: Secondary | ICD-10-CM | POA: Diagnosis not present

## 2024-01-29 DIAGNOSIS — I4510 Unspecified right bundle-branch block: Secondary | ICD-10-CM | POA: Diagnosis not present

## 2024-01-29 DIAGNOSIS — M438X8 Other specified deforming dorsopathies, sacral and sacrococcygeal region: Secondary | ICD-10-CM | POA: Diagnosis not present

## 2024-01-29 DIAGNOSIS — E538 Deficiency of other specified B group vitamins: Secondary | ICD-10-CM | POA: Diagnosis not present

## 2024-01-29 DIAGNOSIS — E271 Primary adrenocortical insufficiency: Secondary | ICD-10-CM | POA: Diagnosis not present

## 2024-01-29 DIAGNOSIS — E785 Hyperlipidemia, unspecified: Secondary | ICD-10-CM | POA: Diagnosis not present

## 2024-01-29 DIAGNOSIS — I951 Orthostatic hypotension: Secondary | ICD-10-CM | POA: Diagnosis not present

## 2024-01-29 DIAGNOSIS — M797 Fibromyalgia: Secondary | ICD-10-CM | POA: Diagnosis not present

## 2024-01-29 DIAGNOSIS — N179 Acute kidney failure, unspecified: Secondary | ICD-10-CM | POA: Diagnosis not present

## 2024-01-29 DIAGNOSIS — I700 Atherosclerosis of aorta: Secondary | ICD-10-CM | POA: Diagnosis not present

## 2024-01-29 DIAGNOSIS — I2510 Atherosclerotic heart disease of native coronary artery without angina pectoris: Secondary | ICD-10-CM | POA: Diagnosis not present

## 2024-01-29 DIAGNOSIS — A0811 Acute gastroenteropathy due to Norwalk agent: Secondary | ICD-10-CM | POA: Diagnosis not present

## 2024-01-31 DIAGNOSIS — N1831 Chronic kidney disease, stage 3a: Secondary | ICD-10-CM | POA: Diagnosis not present

## 2024-01-31 DIAGNOSIS — A0811 Acute gastroenteropathy due to Norwalk agent: Secondary | ICD-10-CM | POA: Diagnosis not present

## 2024-01-31 DIAGNOSIS — N179 Acute kidney failure, unspecified: Secondary | ICD-10-CM | POA: Diagnosis not present

## 2024-01-31 DIAGNOSIS — E271 Primary adrenocortical insufficiency: Secondary | ICD-10-CM | POA: Diagnosis not present

## 2024-01-31 DIAGNOSIS — I951 Orthostatic hypotension: Secondary | ICD-10-CM | POA: Diagnosis not present

## 2024-01-31 DIAGNOSIS — I480 Paroxysmal atrial fibrillation: Secondary | ICD-10-CM | POA: Diagnosis not present

## 2024-02-05 ENCOUNTER — Other Ambulatory Visit (INDEPENDENT_AMBULATORY_CARE_PROVIDER_SITE_OTHER)

## 2024-02-05 ENCOUNTER — Ambulatory Visit: Payer: Self-pay | Admitting: Family Medicine

## 2024-02-05 DIAGNOSIS — E039 Hypothyroidism, unspecified: Secondary | ICD-10-CM | POA: Diagnosis not present

## 2024-02-05 LAB — TSH: TSH: 1.14 u[IU]/mL (ref 0.35–5.50)

## 2024-02-06 NOTE — Progress Notes (Signed)
 LVM to call office or Via MyChart for lab results

## 2024-02-07 DIAGNOSIS — A0811 Acute gastroenteropathy due to Norwalk agent: Secondary | ICD-10-CM | POA: Diagnosis not present

## 2024-02-07 DIAGNOSIS — I480 Paroxysmal atrial fibrillation: Secondary | ICD-10-CM | POA: Diagnosis not present

## 2024-02-07 DIAGNOSIS — N1831 Chronic kidney disease, stage 3a: Secondary | ICD-10-CM | POA: Diagnosis not present

## 2024-02-07 DIAGNOSIS — N179 Acute kidney failure, unspecified: Secondary | ICD-10-CM | POA: Diagnosis not present

## 2024-02-07 DIAGNOSIS — E271 Primary adrenocortical insufficiency: Secondary | ICD-10-CM | POA: Diagnosis not present

## 2024-02-07 DIAGNOSIS — I951 Orthostatic hypotension: Secondary | ICD-10-CM | POA: Diagnosis not present

## 2024-02-07 NOTE — Progress Notes (Signed)
 Called pt; someone answered and hung up

## 2024-02-08 NOTE — Progress Notes (Signed)
 Pt has reviewed lab results via MyChart

## 2024-02-18 ENCOUNTER — Other Ambulatory Visit: Payer: Self-pay | Admitting: Family Medicine

## 2024-02-18 DIAGNOSIS — K5909 Other constipation: Secondary | ICD-10-CM

## 2024-02-19 NOTE — Telephone Encounter (Signed)
 Requested Prescriptions   Pending Prescriptions Disp Refills   amitriptyline  (ELAVIL ) 50 MG tablet [Pharmacy Med Name: AMITRIPTYLINE  50MG  TABLETS] 90 tablet 1    Sig: TAKE 1 TABLET BY MOUTH AT BEDTIME     Date of patient request: 02/19/2024 Last office visit: 12/29/2023 Upcoming visit: Visit date not found Date of last refill: 11/24/2023 Last refill amount: 90x1

## 2024-02-20 ENCOUNTER — Other Ambulatory Visit: Payer: Self-pay | Admitting: Family Medicine

## 2024-03-31 ENCOUNTER — Other Ambulatory Visit: Payer: Self-pay | Admitting: Family Medicine

## 2024-04-08 ENCOUNTER — Other Ambulatory Visit: Payer: Self-pay | Admitting: Family Medicine

## 2024-04-17 DIAGNOSIS — E2740 Unspecified adrenocortical insufficiency: Secondary | ICD-10-CM | POA: Diagnosis not present

## 2024-04-17 DIAGNOSIS — E039 Hypothyroidism, unspecified: Secondary | ICD-10-CM | POA: Diagnosis not present

## 2024-04-17 DIAGNOSIS — E559 Vitamin D deficiency, unspecified: Secondary | ICD-10-CM | POA: Diagnosis not present

## 2024-04-18 ENCOUNTER — Inpatient Hospital Stay: Payer: Medicare Other | Attending: Oncology

## 2024-04-18 ENCOUNTER — Inpatient Hospital Stay (HOSPITAL_BASED_OUTPATIENT_CLINIC_OR_DEPARTMENT_OTHER): Payer: Medicare Other | Admitting: Oncology

## 2024-04-18 VITALS — BP 136/87 | HR 74 | Temp 97.8°F | Resp 20 | Ht 63.0 in | Wt 97.6 lb

## 2024-04-18 DIAGNOSIS — G309 Alzheimer's disease, unspecified: Secondary | ICD-10-CM | POA: Diagnosis not present

## 2024-04-18 DIAGNOSIS — D509 Iron deficiency anemia, unspecified: Secondary | ICD-10-CM | POA: Insufficient documentation

## 2024-04-18 DIAGNOSIS — N189 Chronic kidney disease, unspecified: Secondary | ICD-10-CM | POA: Diagnosis not present

## 2024-04-18 LAB — CBC WITH DIFFERENTIAL (CANCER CENTER ONLY)
Abs Immature Granulocytes: 0.03 K/uL (ref 0.00–0.07)
Basophils Absolute: 0 K/uL (ref 0.0–0.1)
Basophils Relative: 1 %
Eosinophils Absolute: 0 K/uL (ref 0.0–0.5)
Eosinophils Relative: 1 %
HCT: 37.5 % (ref 36.0–46.0)
Hemoglobin: 12.1 g/dL (ref 12.0–15.0)
Immature Granulocytes: 0 %
Lymphocytes Relative: 14 %
Lymphs Abs: 1 K/uL (ref 0.7–4.0)
MCH: 28.7 pg (ref 26.0–34.0)
MCHC: 32.3 g/dL (ref 30.0–36.0)
MCV: 89.1 fL (ref 80.0–100.0)
Monocytes Absolute: 0.3 K/uL (ref 0.1–1.0)
Monocytes Relative: 4 %
Neutro Abs: 6.2 K/uL (ref 1.7–7.7)
Neutrophils Relative %: 80 %
Platelet Count: 270 K/uL (ref 150–400)
RBC: 4.21 MIL/uL (ref 3.87–5.11)
RDW: 14.6 % (ref 11.5–15.5)
WBC Count: 7.7 K/uL (ref 4.0–10.5)
nRBC: 0 % (ref 0.0–0.2)

## 2024-04-18 LAB — FERRITIN: Ferritin: 53 ng/mL (ref 11–307)

## 2024-04-18 NOTE — Progress Notes (Signed)
 [facility] OFFICE PROGRESS NOTE   Diagnosis: Iron deficiency anemia  INTERVAL HISTORY:   Kaitlyn Adams returns as scheduled.  She is here with her husband.  She feels well.  No difficulty with bowel function.  Good appetite.  No bleeding.  She is taking iron. She was admitted with adrenal insufficiency in May.  She was also diagnosed with Norovirus and enterotoxigenic E. coli.   Objective:  Vital signs in last 24 hours:  Blood pressure 136/87, pulse 74, temperature 97.8 F (36.6 C), temperature source Temporal, resp. rate 20, height 5' 3 (1.6 m), weight 97 lb 9.6 oz (44.3 kg), SpO2 100%.   Lymphatics: No cervical, supraclavicular, axillary, or inguinal nodes Resp: Lungs clear aside from an end inspiratory wheeze at the left upper posterior chest, no respiratory distress Cardio: Regular rate and rhythm GI: No mass, nontender, no hepatosplenomegaly Vascular: The left lower leg is slightly larger than the right side, no edema, erythema, or palpable cord.  Bilateral lower extremity varicosities   Lab Results:  Lab Results  Component Value Date   WBC 7.7 04/18/2024   HGB 12.1 04/18/2024   HCT 37.5 04/18/2024   MCV 89.1 04/18/2024   PLT 270 04/18/2024   NEUTROABS 6.2 04/18/2024    CMP  Lab Results  Component Value Date   NA 136 12/29/2023   K 4.7 12/29/2023   CL 107 12/29/2023   CO2 22 12/29/2023   GLUCOSE 83 12/29/2023   BUN 8 12/29/2023   CREATININE 1.19 12/29/2023   CALCIUM  9.5 12/29/2023   PROT 7.3 12/29/2023   ALBUMIN 3.9 12/29/2023   AST 24 12/29/2023   ALT 41 (H) 12/29/2023   ALKPHOS 68 12/29/2023   BILITOT 0.3 12/29/2023   GFRNONAA >60 12/22/2023   GFRAA 60 (L) 10/26/2018    Medications: I have reviewed the patient's current medications.   Assessment/Plan: Iron deficiency anemia 12/16/2022 hemoglobin 5.6, MCV 68, ferritin 3 12/17/2022 2 units red blood cells 12/17/2022 ferrous gluconate 125 mg IV 12/18/2022 upper  endoscopy-esophageal plaques were found secondary to candidiasis, examination otherwise normal 12/18/2022 colonoscopy-post polypectomy scar at the ileocecal valve, melanosis in the colon, anal stricture and nonthrombosed external hemorrhoids found on digital rectal exam, 2 polyps in the distal transverse colon and distal ascending colon.  Tubular adenoma 02/03/2023 stool positive for occult blood x 3 02/03/2023 urine negative for blood 06/20/2023 capsule endoscopy-incomplete capsule endoscopy with inadequate prep and very poor visualization of small bowel. Hospitalization with severe microcytic anemia 12/16/2022 Hospitalization September 2018 with severe microcytic anemia 04/25/2017 hemoglobin 5.0, MCV 53, ferritin 2 04/26/2017 stool positive and in inspiratory wheeze at the left upper posterior chest occult blood Upper endoscopy 04/27/2017-mild Candida esophagitis Colonoscopy 04/27/2017-melanosis, lipomatous ileocecal valve-tubular adenoma, 10 mm polyp at the transverse colon-tubular adenoma Capsule endoscopy 05/10/2018-single linear erosion at 1 hour 32 minutes, otherwise negative Colonoscopy 06/01/2018-polyps removed from the ileocecal valve and ascending colon Esophageal candidiasis 12/18/2022, completed treatment with fluconazole  Addison's disease CKD CAD Alzheimer's disease    Disposition: Kaitlyn Adams has a history of iron deficiency anemia and Hemoccult positive stool.  The hemoglobin and ferritin are normal again today.  She will continue oral iron therapy.  Kaitlyn Adams will continue follow-up of the iron deficiency anemia with Dr. Mahlon.  I recommended she follow-up with Dr. Avram.  She has slight enlargement of the left compared to the right lower leg.  I have a low clinical suspicion for a DVT.  She will seek medical attention if the leg becomes swollen  or painful.  Kaitlyn Adams is not scheduled for a follow-up appointment in the hematology clinic.  I am available to see her as  needed.  Arley Hof, MD  04/18/2024  10:48 AM

## 2024-04-19 DIAGNOSIS — E785 Hyperlipidemia, unspecified: Secondary | ICD-10-CM | POA: Diagnosis not present

## 2024-04-19 DIAGNOSIS — E559 Vitamin D deficiency, unspecified: Secondary | ICD-10-CM | POA: Diagnosis not present

## 2024-04-19 DIAGNOSIS — E039 Hypothyroidism, unspecified: Secondary | ICD-10-CM | POA: Diagnosis not present

## 2024-04-19 DIAGNOSIS — E2740 Unspecified adrenocortical insufficiency: Secondary | ICD-10-CM | POA: Diagnosis not present

## 2024-04-19 DIAGNOSIS — K219 Gastro-esophageal reflux disease without esophagitis: Secondary | ICD-10-CM | POA: Diagnosis not present

## 2024-05-07 ENCOUNTER — Ambulatory Visit (INDEPENDENT_AMBULATORY_CARE_PROVIDER_SITE_OTHER): Admitting: Student in an Organized Health Care Education/Training Program

## 2024-05-07 ENCOUNTER — Encounter: Payer: Self-pay | Admitting: Student in an Organized Health Care Education/Training Program

## 2024-05-07 VITALS — BP 171/75 | HR 91 | Temp 97.7°F | Wt 91.0 lb

## 2024-05-07 DIAGNOSIS — H6120 Impacted cerumen, unspecified ear: Secondary | ICD-10-CM | POA: Insufficient documentation

## 2024-05-07 DIAGNOSIS — J0110 Acute frontal sinusitis, unspecified: Secondary | ICD-10-CM | POA: Diagnosis not present

## 2024-05-07 DIAGNOSIS — H6121 Impacted cerumen, right ear: Secondary | ICD-10-CM | POA: Diagnosis not present

## 2024-05-07 DIAGNOSIS — J019 Acute sinusitis, unspecified: Secondary | ICD-10-CM | POA: Insufficient documentation

## 2024-05-07 MED ORDER — AMOXICILLIN-POT CLAVULANATE 875-125 MG PO TABS: 1.0000 | ORAL_TABLET | Freq: Two times a day (BID) | ORAL | 0 refills | Status: AC

## 2024-05-07 MED ORDER — OXYMETAZOLINE HCL 0.05 % NA SOLN: 1.0000 | Freq: Two times a day (BID) | NASAL | 0 refills | Status: AC | PRN

## 2024-05-07 NOTE — Patient Instructions (Signed)
  VISIT SUMMARY: Today, you were seen for sinus congestion and a persistent cough that you have been experiencing for about a week. You also reported pressure in your forehead, a runny nose, and slight shortness of breath, which is normal for you due to your asthma. You have a history of sinus infections and typically require antibiotics for treatment.  YOUR PLAN: -ACUTE SINUSITIS: Acute sinusitis is an infection of the sinuses that causes symptoms like congestion, cough, and facial pressure. You have been prescribed Augmentin  875 mg to take twice a day for 7 days. You can stop taking it early if your symptoms improve. Additionally, you can use Afrin nasal spray as needed for up to 3 days, but do not use it for longer than that. Please follow up if your symptoms worsen.  INSTRUCTIONS: Please follow up if your symptoms worsen or do not improve after completing the antibiotic course.

## 2024-05-07 NOTE — Progress Notes (Signed)
 Acute Office Visit  Subjective:     Patient ID: Kaitlyn Adams, female    DOB: 02/11/1951, 73 y.o.   MRN: [id number]  Chief Complaint  Patient presents with   Sinus Problem    Patient states during the night and morning coughing a lot. Headaches in the front of head. Pressure in the sinus area. Headache started towards the end of last week.     HPI  Discussed the use of AI scribe software for clinical note transcription with the patient, who gave verbal consent to proceed.  History of Present Illness Kaitlyn Adams is a 73 year old female with asthma who presents with sinus congestion and cough.  She has been experiencing sinus congestion and a persistent cough for about a week, starting around the middle of last week. There is pressure in her forehead and a runny nose. No fever is present, but there is slight shortness of breath, which she reports is normal for her.  She has a history of asthma and has experienced sinus infections in the past, typically requiring antibiotics for treatment. She has used Augmentin  before without any side effects.  She mentions muscle aches and pains but is unsure of their cause. She has allergies, but her current symptoms are worse than her usual allergy  symptoms.  Her husband is currently hospitalized, and she has been visiting him. She denies any recent contact with sick individuals outside of the hospital environment.      Objective:    BP (!) 171/75   Pulse 91   Temp 97.7 F (36.5 C) (Temporal)   Wt 91 lb (41.3 kg)   SpO2 99%   BMI 16.12 kg/m   Physical Exam  Gen: Frail-appearing older woman Ears: Left ear tympanic membrane is normal, right ear is impacted with cerumen, this was removed with a curette, right ear tympanic membrane appears normal with no effusion or erythema Sinus: Moderate tenderness to palpation over the frontal sinuses bilaterally and maxillary sinuses Mouth: Normal oropharynx, no exudate Neck: Very thin,  no tender cervical adenopathy or swelling in the submandibular space Heart: Regular, no murmur Lungs: Unlabored, clear throughout with no wheezing or crackles      Assessment & Plan:    Problem List Items Addressed This Visit       Unprioritized   Acute sinusitis - Primary   Acute sinusitis presents with persistent symptoms, more severe than typical allergies.  She has had symptoms for over 1 week now, very worried about the functional impact of this sinus infection because she is tending to her husband who is hospitalized currently with a heart condition.  I prescribed Augmentin  875 mg BID for 7 days, with the option to stop early if symptoms improve. Recommended Afrin nasal spray PRN for up to 3 days, cautioning against prolonged use. Advised follow-up if symptoms worsen.  No signs of pneumonia, though patient has high frailty.  She has tolerated Augmentin  well in the past, allergy  list reviewed.  Other medications reviewed, no interactions seen.      Relevant Medications   amoxicillin -clavulanate (AUGMENTIN ) 875-125 MG tablet   oxymetazoline  (AFRIN NASAL SPRAY) 0.05 % nasal spray   Cerumen impaction   Procedure Note: Manual Removal of Impacted Cerumen Using a Curette   Indication:  Cerumen impaction causing symptoms (e.g., hearing loss, pain, tinnitus) or preventing assessment of the right ear canal and tympanic membrane.  Procedure:  Explained the procedure to the patient and informed consent was obtained  Review patient  history for contraindications (e.g., nonintact tympanic membrane, history of ear surgery, anatomical abnormalities).  After a position of the patient's head upright, I visualized the ear canal and cerumen using an otoscope.  I gently inserted the curette into the ear canal avoiding contact with the canal walls, and carefully scooped the cerumen, removing it in small pieces.  I reassessed the ear canal and tympanic membrane, there was no residual cerumen nor signs  of trauma.  Follow-Up:  I instructed the patient to report any persistent symptoms such as pain, discharge, or hearing loss.  Schedule a follow-up appointment if necessary.        Meds ordered this encounter  Medications   amoxicillin -clavulanate (AUGMENTIN ) 875-125 MG tablet    Sig: Take 1 tablet by mouth 2 (two) times daily for 7 days.    Dispense:  14 tablet    Refill:  0   oxymetazoline  (AFRIN NASAL SPRAY) 0.05 % nasal spray    Sig: Place 1 spray into both nostrils 2 (two) times daily as needed for congestion (Do not use for more than 3 continuous days.).    Dispense:  30 mL    Refill:  0    No follow-ups on file.  Cleatus Debby Specking, MD

## 2024-05-07 NOTE — Assessment & Plan Note (Signed)
 Procedure Note: Manual Removal of Impacted Cerumen Using a Curette   Indication:  Cerumen impaction causing symptoms (e.g., hearing loss, pain, tinnitus) or preventing assessment of the right ear canal and tympanic membrane.  Procedure:  Explained the procedure to the patient and informed consent was obtained  Review patient history for contraindications (e.g., nonintact tympanic membrane, history of ear surgery, anatomical abnormalities).  After a position of the patient's head upright, I visualized the ear canal and cerumen using an otoscope.  I gently inserted the curette into the ear canal avoiding contact with the canal walls, and carefully scooped the cerumen, removing it in small pieces.  I reassessed the ear canal and tympanic membrane, there was no residual cerumen nor signs of trauma.  Follow-Up:  I instructed the patient to report any persistent symptoms such as pain, discharge, or hearing loss.  Schedule a follow-up appointment if necessary.

## 2024-05-07 NOTE — Assessment & Plan Note (Signed)
 Acute sinusitis presents with persistent symptoms, more severe than typical allergies.  She has had symptoms for over 1 week now, very worried about the functional impact of this sinus infection because she is tending to her husband who is hospitalized currently with a heart condition.  I prescribed Augmentin  875 mg BID for 7 days, with the option to stop early if symptoms improve. Recommended Afrin nasal spray PRN for up to 3 days, cautioning against prolonged use. Advised follow-up if symptoms worsen.  No signs of pneumonia, though patient has high frailty.  She has tolerated Augmentin  well in the past, allergy  list reviewed.  Other medications reviewed, no interactions seen.

## 2024-05-27 ENCOUNTER — Other Ambulatory Visit: Payer: Self-pay | Admitting: Family Medicine

## 2024-06-03 ENCOUNTER — Other Ambulatory Visit: Payer: Self-pay

## 2024-06-03 ENCOUNTER — Other Ambulatory Visit: Payer: Self-pay | Admitting: Internal Medicine

## 2024-06-03 MED ORDER — LEVOCETIRIZINE DIHYDROCHLORIDE 5 MG PO TABS: 5.0000 mg | ORAL_TABLET | Freq: Every day | ORAL | 0 refills | Status: DC | PRN

## 2024-06-03 NOTE — Telephone Encounter (Signed)
 Copied from CRM [id number]. Topic: Clinical - Medication Refill >> Jun 03, 2024  2:48 PM Rilla B wrote: Medication: levocetirizine (XYZAL ) 5 MG tablet  Has the patient contacted their pharmacy? Yes (Agent: If no, request that the patient contact the pharmacy for the refill. If patient does not wish to contact the pharmacy document the reason why and proceed with request.) (Agent: If yes, when and what did the pharmacy advise?)  This is the patient's preferred pharmacy:  [id number] - [place], [place] - [address] [place] [address] Phone: [phone] Fax: [phone]  Is this the correct pharmacy for this prescription? Yes If no, delete pharmacy and type the correct one.   Has the prescription been filled recently? Yes  Is the patient out of the medication? Yes  Has the patient been seen for an appointment in the last year OR does the patient have an upcoming appointment? Yes  Can we respond through MyChart? Yes  Agent: Please be advised that Rx refills may take up to 3 business days. We ask that you follow-up with your pharmacy.

## 2024-06-06 DIAGNOSIS — E039 Hypothyroidism, unspecified: Secondary | ICD-10-CM | POA: Diagnosis not present

## 2024-06-06 MED ORDER — LEVOCETIRIZINE DIHYDROCHLORIDE 5 MG PO TABS: 5.0000 mg | ORAL_TABLET | Freq: Every day | ORAL | 0 refills | Status: DC | PRN

## 2024-06-06 NOTE — Telephone Encounter (Signed)
 Refill sent incorrectly on 10/13. Levocetirizine was sent for #1 instead of #30. I have verbal auth for #30 with 0 refill as patient overdue for appt.

## 2024-06-07 ENCOUNTER — Ambulatory Visit: Payer: Self-pay | Admitting: Family Medicine

## 2024-06-07 ENCOUNTER — Ambulatory Visit (HOSPITAL_BASED_OUTPATIENT_CLINIC_OR_DEPARTMENT_OTHER)
Admission: RE | Admit: 2024-06-07 | Discharge: 2024-06-07 | Disposition: A | Discharge status: Home / Self Care | Source: Ambulatory Visit | Attending: Family Medicine | Admitting: Family Medicine

## 2024-06-07 ENCOUNTER — Encounter: Payer: Self-pay | Admitting: Family Medicine

## 2024-06-07 ENCOUNTER — Ambulatory Visit: Payer: Self-pay | Admitting: *Deleted

## 2024-06-07 ENCOUNTER — Ambulatory Visit: Admitting: Family Medicine

## 2024-06-07 VITALS — BP 112/82 | HR 73 | Temp 98.0°F | Resp 12 | Ht 63.0 in | Wt 92.4 lb

## 2024-06-07 DIAGNOSIS — W19XXXA Unspecified fall, initial encounter: Secondary | ICD-10-CM

## 2024-06-07 DIAGNOSIS — M25552 Pain in left hip: Secondary | ICD-10-CM | POA: Insufficient documentation

## 2024-06-07 DIAGNOSIS — M25562 Pain in left knee: Secondary | ICD-10-CM | POA: Diagnosis not present

## 2024-06-07 DIAGNOSIS — M25752 Osteophyte, left hip: Secondary | ICD-10-CM | POA: Diagnosis not present

## 2024-06-07 DIAGNOSIS — Y92009 Unspecified place in unspecified non-institutional (private) residence as the place of occurrence of the external cause: Secondary | ICD-10-CM | POA: Diagnosis not present

## 2024-06-07 DIAGNOSIS — Z7952 Long term (current) use of systemic steroids: Secondary | ICD-10-CM | POA: Insufficient documentation

## 2024-06-07 MED ORDER — TRAMADOL HCL 50 MG PO TABS: 50.0000 mg | ORAL_TABLET | Freq: Three times a day (TID) | ORAL | 0 refills | Status: AC | PRN

## 2024-06-07 NOTE — Patient Instructions (Addendum)
 Thank you for coming in today.  I am sorry about the fall last week but I am encouraged by the location of your pain and ability to put weight on that leg which is less likely a broken bone.  However I do think it would be reasonable to check some x-rays of your hip, pelvis and left knee since you are continue to have some discomfort.  Please have those performed at the [facility] after leaving our office and I will keep an eye out for the results and call you later today.  I did send in a few tramadol  if needed for increased pain but be careful with that medication as that can cause lightheadedness, dizziness or additionally increased risk for falling.  I expect symptoms to be improving in the next week to 10 days, be seen if not improving or worsening sooner.  Hang in there!  [facility] Address: [address] [place], [place] [address]

## 2024-06-07 NOTE — Progress Notes (Signed)
 Lab results have been discussed.   Verbalized understanding? Yes  Are there any questions? No

## 2024-06-07 NOTE — Progress Notes (Signed)
 Subjective:  Patient ID: Kaitlyn Adams, female    DOB: 08/20/1951  Age: 73 y.o. MRN: [id number]  CC:  Chief Complaint  Patient presents with   Hip Injury    Left hip. Last Thursday a week ago. Patient fell on hardwood floor. Husband has been in the hospital and she has not been able to come in and be seen    HPI ARIBA LEHNEN presents for acute visit above.  PCP is Dr. Mahlon.  Left hip injury Mechanical fall onto a hardwood floor on October 9. At home, walking and carrying object, tripped on furniture. Fell onto left hip/side.  No head injury or other injuries.  No bruising/wounds/redness.  Able to weightbear afterwards. Able to walk on left leg, but has had persistent soreness. No back pain. No weakness.  Has been occupied with husbands health - recent aortic valve replacement last week - he is home and recovering.  Tx: advil, min relief. Some relief with heating pad.  Has tolerated tramadol  in the past.   Previous pelvis x-ray in October 2023, indicated preserved articular space in the hips and no acute findings.  Loss of disc height at L5-S1 but no acute fracture observed at that time.  CT chest/abdomen/pelvis in April of this year without acute osseous findings.  Unchanged superior endplate wedge deformity of L1 compared to prior radiographs.    History Patient Active Problem List   Diagnosis Date Noted   Long term current use of systemic steroids 06/07/2024   Acute sinusitis 05/07/2024   Cerumen impaction 05/07/2024   Elevated troponin 12/20/2023   RBBB 12/20/2023   Sinus pause 12/20/2023   Adrenal insufficiency (Addison's disease) (HCC) 12/20/2023   AKI (acute kidney injury) 12/18/2023   Nausea and vomiting 12/18/2023   Frequent falls 02/07/2023   Candida esophagitis (HCC) 12/18/2022   Benign neoplasm of ascending colon 12/18/2022   Benign neoplasm of transverse colon 12/18/2022   Anal stricture 12/18/2022   CKD (chronic kidney disease) stage 3, GFR 30-59  ml/min (HCC) 12/17/2022   Adrenal insufficiency 11/06/2020   Disorder of bone 11/06/2020   Disorder of calcium  metabolism 11/06/2020   Vitamin D  deficiency 11/06/2020   Hypokalemia 10/25/2018   Moderate protein-calorie malnutrition 08/01/2018   Upper airway cough syndrome 05/22/2017   Symptomatic anemia 04/26/2017   Chronic constipation 07/07/2016   History of colonic polyps 07/07/2016   Chronic anticoagulation 07/07/2016   Iron deficiency anemia 06/15/2016   Anxiety and depression 05/18/2016   Physical exam 11/18/2015   Pain in joint, ankle and foot 11/16/2015   PAT (paroxysmal atrial tachycardia) 09/08/2015   Hyperlipidemia 07/30/2015   CAD (coronary artery disease)    Chest pain of uncertain etiology 05/15/2015   Chronic bronchitis (HCC) 06/17/2011   Orthostatic hypotension 12/06/2010   PAF (paroxysmal atrial fibrillation) (HCC)    Renal insufficiency    Myalgia    Hypothyroidism    Glaucoma    GERD, SEVERE 08/19/2010   Osteoarthritis 04/17/2008   FIBROMYALGIA 04/17/2008   Past Medical History:  Diagnosis Date   Addison disease (HCC)    Anemia    Anxiety    Arthritis    Asthma    - normal spirometry 2009 FEV1  >90% predicted.  - methacoline challenge neg 11/09   Bronchitis    recurrent   Cataract    Chronic cough    - sinus CT 03-23-10 > neg.  - allergy  profile 03-23-10 > nl, IgE 14.  - flutter valve rx 03-23-10  COPD (chronic obstructive pulmonary disease) (HCC)    Fibromyalgia    GERD (gastroesophageal reflux disease)    exacerbating VCD   Glaucoma    Heart murmur    History of blood transfusion    Hyperlipidemia    Hypertension    Hyperthyroidism    with hot nodule.  - total thyroidectomy 11-05-08 benign -> subsequent hypothyroidism.   Hypokalemia    Hypothyroidism    Hypothyroidism    a. following thyroidectomy.   Mild CAD    a. LHC 04/2015 - mild nonobstructive CAD with 30% dLAD, 40% D1, 25% pLCx, 30% mRCA, 30% RPDA, normal EF >65% with normal LVEDP.    Orthostatic hypotension    Paroxysmal atrial fibrillation (HCC)    Renal insufficiency    Vasomotor rhinitis    exacerbates VCD   Vocal cord dysfunction    proven on FOB 9/09   Past Surgical History:  Procedure Laterality Date   ABDOMINAL HYSTERECTOMY  1980   ABDOMINAL HYSTERECTOMY     BASAL CELL CARCINOMA EXCISION  06/2007   BIOPSY  12/18/2022   Procedure: BIOPSY;  Surgeon: Avram Lupita BRAVO, MD;  Location: [facility];  Service: Gastroenterology;;   CARDIAC CATHETERIZATION N/A 05/15/2015   Procedure: Left Heart Cath and Coronary Angiography;  Surgeon: Ozell Fell, MD;  Location: [facility] INVASIVE CV LAB;  Service: Cardiovascular;  Laterality: N/A;   COLON SURGERY     remove  polyp   COLONOSCOPY N/A 04/27/2017   Dr Shila for scant rectal bleeding and iron def anemia: Melanosis coli, lipomatous IC valve, left sided tics, non-bleeding hemorrhoid, 10 mm transverse polyp.     COLONOSCOPY WITH PROPOFOL  N/A 12/18/2022   Procedure: COLONOSCOPY WITH PROPOFOL ;  Surgeon: Avram Lupita BRAVO, MD;  Location: [facility];  Service: Gastroenterology;  Laterality: N/A;   ESOPHAGOGASTRODUODENOSCOPY N/A 04/27/2017   Shila Gustav GAILS, MD; [facility] END[facility]. for iron def anemia.  Monilial/candidial esophagitis. Small HH.     ESOPHAGOGASTRODUODENOSCOPY (EGD) WITH PROPOFOL  N/A 12/18/2022   Procedure: ESOPHAGOGASTRODUODENOSCOPY (EGD) WITH PROPOFOL ;  Surgeon: Avram Lupita BRAVO, MD;  Location: [facility];  Service: Gastroenterology;  Laterality: N/A;   EYE SURGERY     POLYPECTOMY  12/18/2022   Procedure: POLYPECTOMY;  Surgeon: Avram Lupita BRAVO, MD;  Location: [facility];  Service: Gastroenterology;;   scar tissue removal  1982   THYROID  SURGERY  2010   THYROIDECTOMY  11/05/2008   Allergies  Allergen Reactions   Fenofibrate  Other (See Comments)    GI side effects    Erythromycin Nausea And Vomiting    REACTION: nausea and vomiting   Guaifenesin  Er Palpitations   Sulfonamide Derivatives Nausea And  Vomiting    REACTION: nausea and vomiting   Prior to Admission medications   Medication Sig Start Date End Date Taking? Authorizing Provider  Acetaminophen  (TYLENOL  PO) Take by mouth as needed.   Yes [provider]  albuterol  (VENTOLIN  HFA) 108 (90 Base) MCG/ACT inhaler INHALE 2 PUFFS INTO THE LUNGS EVERY 6 HOURS AS NEEDED FOR WHEEZING OR SHORTNESS OF BREATH 02/02/23  Yes Tabori, Katherine E, MD  amitriptyline  (ELAVIL ) 50 MG tablet TAKE 1 TABLET BY MOUTH AT BEDTIME 02/19/24  Yes Tabori, Katherine E, MD  atorvastatin  (LIPITOR) 20 MG tablet TAKE 1 TABLET(20 MG) BY MOUTH DAILY 05/27/24  Yes Tabori, Katherine E, MD  cholecalciferol (VITAMIN D3) 25 MCG (1000 UNIT) tablet Take 1,000 Units by mouth daily.   Yes [provider]  cyanocobalamin  (VITAMIN B12) 1000 MCG tablet Take 1 tablet (1,000 mcg total) by  mouth daily. 12/22/23  Yes Cheryle Page, MD  diphenhydramine -acetaminophen  (TYLENOL  PM) 25-500 MG TABS tablet Take 1 tablet by mouth at bedtime as needed (pain).   Yes [provider]  donepezil  (ARICEPT ) 5 MG tablet TAKE 1 TABLET(5 MG) BY MOUTH AT BEDTIME 11/06/23  Yes Tabori, Katherine E, MD  Ferrous Sulfate  (IRON PO) Take 1 tablet by mouth daily.   Yes [provider]  furosemide  (LASIX ) 20 MG tablet Take 20 mg by mouth daily. 02/17/24  Yes [provider]  hydrocortisone  (CORTEF ) 10 MG tablet Take 10 mg by mouth 2 (two) times daily. Patient reports taking BID 09/07/2022 09/30/20  Yes [provider]  hydroxypropyl methylcellulose / hypromellose (ISOPTO TEARS / GONIOVISC) 2.5 % ophthalmic solution Place 2 drops into both eyes daily as needed for dry eyes.   Yes [provider]  levocetirizine (XYZAL ) 5 MG tablet Take 1 tablet (5 mg total) by mouth daily as needed for allergies. 06/06/24  Yes Meade Verdon RAMAN, MD  levothyroxine  (SYNTHROID ) 50 MCG tablet TAKE 1 TABLET(50 MCG) BY MOUTH DAILY 04/01/24  Yes Tabori, Katherine E, MD  oxymetazoline   (AFRIN NASAL SPRAY) 0.05 % nasal spray Place 1 spray into both nostrils 2 (two) times daily as needed for congestion (Do not use for more than 3 continuous days.). 05/07/24  Yes Jerrell Cleatus Ned, MD  potassium chloride  SA (KLOR-CON  M) 20 MEQ tablet Take 3 tablets (60 mEq total) by mouth daily. 01/09/24  Yes Goodrich, Callie E, PA-C  fluticasone -salmeterol (ADVAIR DISKUS) 250-50 MCG/ACT AEPB Inhale 1 puff into the lungs in the morning and at bedtime. Patient not taking: Reported on 06/07/2024 11/23/23   Meade Verdon RAMAN, MD  nitroGLYCERIN  (NITROSTAT ) 0.4 MG SL tablet Place 1 tablet (0.4 mg total) under the tongue every 5 (five) minutes as needed for chest pain. Patient not taking: Reported on 06/07/2024 12/22/23   Cheryle Page, MD   Social History   Socioeconomic History   Marital status: Married    Spouse name: Not on file   Number of children: 1   Years of education: Not on file   Highest education level: 12th grade  Occupational History   Occupation: owns a Museum/gallery curator   Occupation: OWNER    Employer: [employer]  Tobacco Use   Smoking status: Never   Smokeless tobacco: Never  Vaping Use   Vaping status: Never Used  Substance and Sexual Activity   Alcohol  use: Yes    Alcohol /week: 1.0 standard drink of alcohol     Types: 1 Glasses of wine per week    Comment: occasional wine or beer   Drug use: No   Sexual activity: Yes    Birth control/protection: None  Other Topics Concern   Not on file  Social History Narrative   Married, 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   Occasional wine or beer no alcohol  tobacco or drug use otherwise   Social Drivers of Corporate investment banker Strain: Low Risk  (01/16/2024)   Overall Financial Resource Strain (CARDIA)    Difficulty of Paying Living Expenses: Not hard at all  Food Insecurity: No Food Insecurity (01/16/2024)   Hunger Vital Sign    Worried About Running Out of Food in the Last Year: Never  true    Ran Out of Food in the Last Year: Never true  Transportation Needs: No Transportation Needs (01/16/2024)   PRAPARE - Administrator, Civil Service (Medical): No  Lack of Transportation (Non-Medical): No  Physical Activity: Insufficiently Active (01/16/2024)   Exercise Vital Sign    Days of Exercise per Week: 3 days    Minutes of Exercise per Session: 30 min  Stress: No Stress Concern Present (01/16/2024)   [employer] of Occupational Health - Occupational Stress Questionnaire    Feeling of Stress : Only a little  Social Connections: Moderately Integrated (01/16/2024)   Social Connection and Isolation Panel    Frequency of Communication with Friends and Family: More than three times a week    Frequency of Social Gatherings with Friends and Family: Never    Attends Religious Services: 1 to 4 times per year    Active Member of [employer] or Organizations: No    Attends Banker Meetings: Never    Marital Status: Married  Recent Concern: Social Connections - Moderately Isolated (12/18/2023)   Social Connection and Isolation Panel    Frequency of Communication with Friends and Family: Three times a week    Frequency of Social Gatherings with Friends and Family: Once a week    Attends Religious Services: Never    Database administrator or Organizations: No    Attends Banker Meetings: Never    Marital Status: Married  Catering manager Violence: Not At Risk (01/16/2024)   Humiliation, Afraid, Rape, and Kick questionnaire    Fear of Current or Ex-Partner: No    Emotionally Abused: No    Physically Abused: No    Sexually Abused: No    Review of Systems   Objective:   Vitals:   06/07/24 0959  BP: 112/82  Pulse: 73  Resp: 12  Temp: 98 F (36.7 C)  TempSrc: Temporal  SpO2: 97%  Weight: 92 lb 6.4 oz (41.9 kg)  Height: 5' 3 (1.6 m)     Physical Exam Vitals reviewed.  Constitutional:      General: She is not in acute distress.     Appearance: Normal appearance. She is well-developed.  HENT:     Head: Normocephalic and atraumatic.  Cardiovascular:     Rate and Rhythm: Normal rate.  Pulmonary:     Effort: Pulmonary effort is normal.  Musculoskeletal:     Comments: Chaperone for exam - Kelly.  Skin intact, no ecchymosis soft tissue swelling or erythema over lateral hip, upper thigh.  Tender over the trochanteric bursa, lateral hip with some discomfort on external rotation greater than internal rotation. No midline bony tenderness of thoracic or lumbar spine, minimal discomfort over the SI joint on the left and slightly laterally. Left knee, skin intact, no ecchymosis erythema or effusion.  Full range of motion but some discomfort at terminal flexion, extension and with palpation of medial and lateral joint lines.  No appreciable crepitus.  Neurovascular intact distally.  Neurological:     Mental Status: She is alert and oriented to person, place, and time.  Psychiatric:        Mood and Affect: Mood normal.        Assessment & Plan:  Kaitlyn Adams is a 73 y.o. female . Fall in home, initial encounter - Plan: traMADol  (ULTRAM ) 50 MG tablet, DG Knee Complete 4 Views Left, DG HIP UNILAT W OR W/O PELVIS 2-3 VIEWS LEFT  Left hip pain - Plan: DG HIP UNILAT W OR W/O PELVIS 2-3 VIEWS LEFT  Acute pain of left knee - Plan: DG Knee Complete 4 Views Left  Mechanical fall at home 8 days ago, falling onto  the lateral aspect of her hip.  No twisting injury.  Able to weight-bear afterwards and currently.  Medial and lateral knee discomfort but no effusion.  Question flare of prior degenerative disease.  Primarily lateral hip pain, some discomfort with external rotation, less likely fracture, more likely contusion but will check imaging given persistent discomfort.  X-rays ordered today at [facility].  Given minimal improvement with Advil short-term tramadol  provided with potential side effects and risks.  No concerns  on controlled substance database.  She is not taking Elavil  and cautioned about fall side effects.  RTC precautions given.  All questions answered. Meds ordered this encounter  Medications   traMADol  (ULTRAM ) 50 MG tablet    Sig: Take 1 tablet (50 mg total) by mouth every 8 (eight) hours as needed for up to 5 days.    Dispense:  15 tablet    Refill:  0   Patient Instructions  Thank you for coming in today.  I am sorry about the fall last week but I am encouraged by the location of your pain and ability to put weight on that leg which is less likely a broken bone.  However I do think it would be reasonable to check some x-rays of your hip, pelvis and left knee since you are continue to have some discomfort.  Please have those performed at the [facility] after leaving our office and I will keep an eye out for the results and call you later today.  I did send in a few tramadol  if needed for increased pain but be careful with that medication as that can cause lightheadedness, dizziness or additionally increased risk for falling.  I expect symptoms to be improving in the next week to 10 days, be seen if not improving or worsening sooner.  Hang in there!  [facility] Address: [address] [place], [place] [address]    Signed,   Reyes Pines, MD [facility], [facility] 06/07/24 10:33 AM

## 2024-06-07 NOTE — Telephone Encounter (Signed)
 FYI Only or Action Required?: FYI only for provider.  Patient was last seen in primary care on 05/07/2024 by Jerrell Cleatus Ned, MD.  Called Nurse Triage reporting Hip Injury.  Symptoms began several days ago.  Interventions attempted: Nothing.  Symptoms are: gradually worsening.  Triage Disposition: See HCP Within 4 Hours (Or PCP Triage)  Patient/caregiver understands and will follow disposition?: yes   Reason for Disposition  [1] SEVERE pain (e.g., excruciating) AND [2] not improved 2 hours after pain medicine/ice packs  Answer Assessment - Initial Assessment Questions 1. MECHANISM: How did the injury happen? (e.g., twisting injury, direct blow)      Tripped- hit hardwood floor 2. ONSET: When did the injury happen? (e.g., minutes, hours ago)      06/04/24 3. LOCATION: Where is the injury located?      Left side/hip 4. APPEARANCE of INJURY: What does the injury look like?  (e.g., deformity of leg)     Some swelling at hip 5. SEVERITY: Can you put weight on that leg? Can you walk?      Yes- painful, limping 6. SIZE: For cuts, bruises, or swelling, ask: How large is it? (e.g., inches or centimeters;  entire joint)      No bruising, swelling on hip- hand size 7. PAIN: Is there pain? If Yes, ask: How bad is the pain?   What does it keep you from doing? (Scale 0-10; or none, mild, moderate, severe)     8/10  Protocols used: Hip Injury-A-AH  Copied from CRM #[id number]. Topic: Clinical - Red Word Triage >> Jun 07, 2024  8:13 AM [place] A wrote: [facility] that prompted transfer to Nurse Triage: Patient fell last week. Her left hip is hurting really bad.

## 2024-06-11 ENCOUNTER — Other Ambulatory Visit: Payer: Self-pay | Admitting: *Deleted

## 2024-06-11 MED ORDER — LEVOCETIRIZINE DIHYDROCHLORIDE 5 MG PO TABS: 5.0000 mg | ORAL_TABLET | Freq: Every day | ORAL | 0 refills | Status: DC | PRN

## 2024-06-18 ENCOUNTER — Ambulatory Visit (INDEPENDENT_AMBULATORY_CARE_PROVIDER_SITE_OTHER): Admitting: Student in an Organized Health Care Education/Training Program

## 2024-06-18 ENCOUNTER — Encounter: Payer: Self-pay | Admitting: Student in an Organized Health Care Education/Training Program

## 2024-06-18 VITALS — BP 153/77 | HR 68 | Wt 93.0 lb

## 2024-06-18 DIAGNOSIS — M545 Low back pain, unspecified: Secondary | ICD-10-CM | POA: Insufficient documentation

## 2024-06-18 DIAGNOSIS — H6692 Otitis media, unspecified, left ear: Secondary | ICD-10-CM | POA: Diagnosis not present

## 2024-06-18 MED ORDER — AMOXICILLIN-POT CLAVULANATE 875-125 MG PO TABS: 1.0000 | ORAL_TABLET | Freq: Two times a day (BID) | ORAL | 0 refills | Status: AC

## 2024-06-18 NOTE — Progress Notes (Signed)
 Acute Office Visit  Patient ID: Kaitlyn Adams, female    DOB: 08-28-1950, 73 y.o.   MRN: [id number]  PCP: Mahlon Comer BRAVO, MD  Chief Complaint  Patient presents with   Back Pain    Back pain has gotten worse rather than better. Was seen 2 weeks ago for abck pain and was prescribed tramadol  but did not seem to help. Hurts to walk  Sinus pain and headache started this week     Subjective:     HPI  Discussed the use of AI scribe software for clinical note transcription with the patient, who gave verbal consent to proceed.  History of Present Illness Kaitlyn Adams is a 73 year old female with osteopenia who presents with worsening low back pain radiating to the left knee.  She has been experiencing worsening low back pain that began after carrying groceries approximately one week ago. The pain originates at her waist and radiates down to her left knee, persisting throughout the day and exacerbated by walking. No relief from leaning forward, and walking is particularly difficult. Despite taking tramadol  as prescribed and trying Advil, three tablets at bedtime, she has not found relief. No numbness, tingling, or weakness in her legs, and no changes in bowel or bladder function, although she notes increased frequency of urination without loss of control.  She suspects the onset of a sinus infection, noting tenderness on the left side of her face. She has not been using nasal rinses. She previously had an antibiotic in September without side effects.  Her medical history includes osteopenia, diagnosed in 2018, and Addison's disease. She is currently on Synthroid  and Lasix . She denies any recent weight loss, fever, or chills.  Her husband recently underwent aortic valve replacement surgery and is recovering well, though this has placed additional responsibilities on her. She lives with her husband and a small dog.  Acute visit about 2 weeks ago after a fall that resulted in  injuries of the knee and hip.  X-rays were reassuring at that time.  She seemed to recover well with supportive care, no further falls.     Objective:    BP (!) 153/77   Pulse 68   Wt 93 lb (42.2 kg)   SpO2 100%   BMI 16.47 kg/m   Physical Exam  Gen: Frail-appearing older woman Ears: Normal hearing, right tympanic membrane is normal, left tympanic membrane is erythematous, mildly bulging, no light reflex, no effusion Neck: Normal thyroid , no nodules or adenopathy Heart: Regular, no murmur Lungs: Unlabored, clear throughout Back: Point tenderness on the lumbar spine around L2, tenderness of the paraspinal muscles on the left side, no bruising Neuro: Alert, conversational, full strength upper and lower extremities, moderately delayed get up and go, got to the table without assistance, normal patellar reflexes, normal sensation throughout     Assessment & Plan:   Problem List Items Addressed This Visit       Unprioritized   Acute low back pain - Primary   Acute low back pain for 1 week, radiates down the right leg but does not sound like true radiculopathy.  This occurred after she was carrying groceries, and after picking up her 30 pound dog.  She has a history of osteopenia, very small and frail appearing woman.  She at risk for compression fracture of the lumbar spine.  Will get x-rays to rule that out.  I recommended supportive care for acute low back pain with NSAIDs.  Relevant Orders   DG Lumbar Spine Complete   Left otitis media   Acute otitis media on the left side on exam in a person with recurrent sinus infections.  Not doing sinus hygiene or irrigation.  I gave her the option of supportive care and watching symptoms.  She prefers to start treatment with antibiotics which is reasonable given how red the tympanic membrane is.  Will prescribe Augmentin .  I warned her about the risks of antibiotic associated diarrhea.      Relevant Medications    amoxicillin -clavulanate (AUGMENTIN ) 875-125 MG tablet    Meds ordered this encounter  Medications   amoxicillin -clavulanate (AUGMENTIN ) 875-125 MG tablet    Sig: Take 1 tablet by mouth 2 (two) times daily for 7 days.    Dispense:  14 tablet    Refill:  0    Return if symptoms worsen or fail to improve.  Kaitlyn Debby Specking, MD [facility]

## 2024-06-18 NOTE — Patient Instructions (Signed)
  VISIT SUMMARY: Today, we discussed your worsening low back pain that radiates to your left knee, which started after carrying groceries. We also addressed your concerns about a possible sinus infection and reviewed your osteopenia management.  YOUR PLAN: -LOW BACK PAIN WITH LEFT LOWER EXTREMITY RADICULOPATHY: Your low back pain, which radiates to your left knee, is likely due to a muscle strain or other musculoskeletal issues. We will order lumbar spine X-rays to check for any compression fractures. You should take Advil, but no more than two tablets at a time, and you can add Tylenol  for additional pain relief. We discussed the side effects of tramadol , and you should avoid using it unless absolutely necessary due to the risk of falls.  -OSTEOPENIA: Osteopenia means you have lower bone density, which increases your risk for fractures. Given your recent back pain, we need to be cautious about the possibility of a compression fracture.  -ACUTE SINUSITIS: Acute sinusitis is an infection of the sinuses, causing tenderness on the left side of your face. We will prescribe antibiotics to treat the infection. You should also use nasal rinses daily while you have symptoms, and consider using them regularly if you get sinus infections often.  INSTRUCTIONS: Please follow up after your lumbar spine X-rays to discuss the results. If your symptoms worsen or you experience new symptoms, contact our office immediately.

## 2024-06-18 NOTE — Assessment & Plan Note (Signed)
 Acute low back pain for 1 week, radiates down the right leg but does not sound like true radiculopathy.  This occurred after she was carrying groceries, and after picking up her 30 pound dog.  She has a history of osteopenia, very small and frail appearing woman.  She at risk for compression fracture of the lumbar spine.  Will get x-rays to rule that out.  I recommended supportive care for acute low back pain with NSAIDs.

## 2024-06-18 NOTE — Assessment & Plan Note (Signed)
 Acute otitis media on the left side on exam in a person with recurrent sinus infections.  Not doing sinus hygiene or irrigation.  I gave her the option of supportive care and watching symptoms.  She prefers to start treatment with antibiotics which is reasonable given how red the tympanic membrane is.  Will prescribe Augmentin .  I warned her about the risks of antibiotic associated diarrhea.

## 2024-06-20 ENCOUNTER — Ambulatory Visit: Admitting: Psychology

## 2024-06-20 ENCOUNTER — Ambulatory Visit (HOSPITAL_BASED_OUTPATIENT_CLINIC_OR_DEPARTMENT_OTHER)
Admission: RE | Admit: 2024-06-20 | Discharge: 2024-06-20 | Disposition: A | Discharge status: Home / Self Care | Source: Ambulatory Visit | Attending: Student in an Organized Health Care Education/Training Program | Admitting: Student in an Organized Health Care Education/Training Program

## 2024-06-20 ENCOUNTER — Ambulatory Visit (HOSPITAL_BASED_OUTPATIENT_CLINIC_OR_DEPARTMENT_OTHER)

## 2024-06-20 DIAGNOSIS — I700 Atherosclerosis of aorta: Secondary | ICD-10-CM | POA: Diagnosis not present

## 2024-06-20 DIAGNOSIS — M4856XA Collapsed vertebra, not elsewhere classified, lumbar region, initial encounter for fracture: Secondary | ICD-10-CM | POA: Diagnosis not present

## 2024-06-20 DIAGNOSIS — M5137 Other intervertebral disc degeneration, lumbosacral region with discogenic back pain only: Secondary | ICD-10-CM | POA: Diagnosis not present

## 2024-06-20 DIAGNOSIS — M545 Low back pain, unspecified: Secondary | ICD-10-CM | POA: Insufficient documentation

## 2024-06-20 DIAGNOSIS — M4807 Spinal stenosis, lumbosacral region: Secondary | ICD-10-CM | POA: Diagnosis not present

## 2024-06-25 ENCOUNTER — Ambulatory Visit: Payer: Self-pay | Admitting: Student in an Organized Health Care Education/Training Program

## 2024-06-25 NOTE — Telephone Encounter (Signed)
 Copied from CRM #[id number]. Topic: Clinical - Lab/Test Results >> Jun 25, 2024  7:57 AM Macario HERO wrote: Reason for CRM: Patient called regarding missed call from Dr. Jerrell. She advised for us  to please call her home regarding lab results. [phone]

## 2024-06-25 NOTE — Telephone Encounter (Signed)
 Pt is returning your call, phone number below she would like for you to call back at.

## 2024-06-26 ENCOUNTER — Ambulatory Visit

## 2024-06-26 MED ORDER — ALENDRONATE SODIUM 10 MG PO TABS: 10.0000 mg | ORAL_TABLET | Freq: Every day | ORAL | 2 refills | Status: DC

## 2024-06-26 NOTE — Progress Notes (Signed)
 Tried to call patient to set up 2 week appt with Dr. Jerrell or Dr. Mahlon. Patient answered and hung up. I will try again later

## 2024-06-27 NOTE — Telephone Encounter (Signed)
 Copied from CRM [id number]. Topic: General - Other >> Jun 27, 2024  9:17 AM Roselie BROCKS wrote: Reason for CRM: Patient returning a call to [facility], requesting a return call

## 2024-06-27 NOTE — Progress Notes (Signed)
 Patient scheduled visit with Dr. Jerrell on 07/10/24

## 2024-06-27 NOTE — Progress Notes (Signed)
 LVM to call office.

## 2024-07-10 ENCOUNTER — Ambulatory Visit (INDEPENDENT_AMBULATORY_CARE_PROVIDER_SITE_OTHER): Admitting: Student in an Organized Health Care Education/Training Program

## 2024-07-10 ENCOUNTER — Encounter: Payer: Self-pay | Admitting: Student in an Organized Health Care Education/Training Program

## 2024-07-10 VITALS — BP 142/86 | HR 66 | Wt 91.8 lb

## 2024-07-10 DIAGNOSIS — L299 Pruritus, unspecified: Secondary | ICD-10-CM | POA: Insufficient documentation

## 2024-07-10 DIAGNOSIS — G8929 Other chronic pain: Secondary | ICD-10-CM

## 2024-07-10 DIAGNOSIS — M8000XD Age-related osteoporosis with current pathological fracture, unspecified site, subsequent encounter for fracture with routine healing: Secondary | ICD-10-CM | POA: Diagnosis not present

## 2024-07-10 DIAGNOSIS — R5381 Other malaise: Secondary | ICD-10-CM | POA: Diagnosis not present

## 2024-07-10 DIAGNOSIS — M545 Low back pain, unspecified: Secondary | ICD-10-CM | POA: Diagnosis not present

## 2024-07-10 DIAGNOSIS — M810 Age-related osteoporosis without current pathological fracture: Secondary | ICD-10-CM | POA: Insufficient documentation

## 2024-07-10 DIAGNOSIS — N1832 Chronic kidney disease, stage 3b: Secondary | ICD-10-CM | POA: Diagnosis not present

## 2024-07-10 LAB — COMPREHENSIVE METABOLIC PANEL WITH GFR
ALT: 13 U/L (ref 0–35)
AST: 22 U/L (ref 0–37)
Albumin: 3.7 g/dL (ref 3.5–5.2)
Alkaline Phosphatase: 41 U/L (ref 39–117)
BUN: 19 mg/dL (ref 6–23)
CO2: 17 meq/L — ABNORMAL LOW (ref 19–32)
Calcium: 9.8 mg/dL (ref 8.4–10.5)
Chloride: 115 meq/L — ABNORMAL HIGH (ref 96–112)
Creatinine, Ser: 1.66 mg/dL — ABNORMAL HIGH (ref 0.40–1.20)
GFR: 30.14 mL/min — ABNORMAL LOW (ref >60.00)
Glucose, Bld: 78 mg/dL (ref 70–99)
Potassium: 3.1 meq/L — ABNORMAL LOW (ref 3.5–5.1)
Sodium: 138 meq/L (ref 135–145)
Total Bilirubin: 0.4 mg/dL (ref 0.2–1.2)
Total Protein: 8.1 g/dL (ref 6.0–8.3)

## 2024-07-10 LAB — VITAMIN D 25 HYDROXY (VIT D DEFICIENCY, FRACTURES): VITD: 31.37 ng/mL (ref 30.00–100.00)

## 2024-07-10 NOTE — Progress Notes (Signed)
 Acute Office Visit  Patient ID: Kaitlyn Adams, female    DOB: November 04, 1950, 73 y.o.   MRN: [id number]  PCP: Mahlon Comer BRAVO, MD  Chief Complaint  Patient presents with   Back Pain    2 week follow up and itchy skin     Subjective:     HPI  Discussed the use of AI scribe software for clinical note transcription with the patient, who gave verbal consent to proceed.  History of Present Illness Kaitlyn Adams is a 73 year old female with osteoporosis and a stable compression fracture at L1 who presents for follow-up of her back pain and medication management.  Her back pain has improved but she continues to experience tenderness in the area. The pain is still present and sensitive to touch, though it is better than before. She is able to walk around the house daily and has not had any recent changes in her activity level. She has a known stable compression fracture at L1, confirmed by an x-ray, which was also noted in April. She does not recall how the fracture originally occurred as it happened a long time ago.  She has been prescribed Fosamax (alendronate) for osteoporosis and has started taking it without any side effects. She sometimes takes the medication with a full glass of water to avoid reflux symptoms. No upset stomach or reflux has been experienced.  She reports one fall at home where she tripped over an object on the floor. She did not sustain any injuries and was able to get up with assistance from her husband. She scooted on her butt to get his attention as he did not hear her initially.  She mentions experiencing itching with small bumps under the skin, which has worsened recently. She has not changed any lotions or dietary habits. She uses Vaseline Intensive Care lotion as a moisturizer.      Objective:    BP (!) 142/86   Pulse 66   Wt 91 lb 12.8 oz (41.6 kg)   BMI 16.26 kg/m   Physical Exam  Gen: Very frail appearing older woman Heart: Regular, no  murmur LUngs: Unlabored, clear throughout Skin: Patient reports diffuse pruritus on her back and abdomen.  The skin appears healthy, there are some excoriations, diffuse seborrheic keratoses, but no erythema, no papules, no scaling Ext: Diffusely sarcopenic, no edema      Assessment & Plan:   Problem List Items Addressed This Visit       Unprioritized   CKD (chronic kidney disease) stage 3, GFR 30-59 ml/min (HCC)   History of CKD stage IIIa.  Based on her sarcopenia, I suspect the creatinine may overestimate her GFR, will check a Cystatin C.  May need to adjust osteoporosis treatment if the GFR is lower.      Relevant Orders   Comprehensive metabolic panel with GFR   Cystatin C with Glomerular Filtration Rate, Estimated (eGFR)   Chronic low back pain - Primary   Recent flare of acute low back pain has improved, now a residual chronic low back pain due to a chronic L1 compression fracture.  Patient is tolerating it pretty well.  Neuroexam is reassuring.  She is diffusely frail and has physical deconditioning.  I recommended physical therapy to work on gait training, leg strengthening, and core strengthening.      Relevant Orders   Ambulatory referral to Physical Therapy   Osteoporosis   Diagnosed based on the presence of an L1 compression fracture which  has been chronic, present since at least April on imaging.  Patient is unsure of when this happened.  I recommended we start alendronate 10 mg daily, she is tolerating this well.  No issues with reflux.  Will check vitamin D  levels today.  She has CKD 3A, will recheck renal function with a Cystatin C to ensure safety of the bisphosphonate.      Relevant Orders   VITAMIN D  25 Hydroxy (Vit-D Deficiency, Fractures)   Pruritus   Patient with moderate diffuse pruritus that has been an issue on and off for many years.  Skin is healthy today, no signs of inflammatory condition.  No scaling or rashes.  She has excoriations.  Not much xerosis.   I recommended against the use of Benadryl  given her high risk of falls.  I recommended she continue to use over-the-counter hydrating lotion, like Eucerin or Vaseline.  I recommended over-the-counter use of topical menthol for comfort.  I do not think a topical steroid would be helpful at this point based on the exam, and she already has quite thin skin.  Will check a bilirubin today along with renal function to rule those out as the cause.      Physical deconditioning   Patient with significant physical deconditioning, at high risk for falls.  She has diffuse sarcopenia and declining functional status at home.  Her husband is also has health issues, recently had heart surgery.  I recommended physical therapy to work on conditioning and gait training.  I recommended consideration of a life alert because I think she will remain high risk for falls and injury.  Would like PT to evaluate for any assist device needs.      Relevant Orders   Ambulatory referral to Physical Therapy    Return in about 3 months (around 10/10/2024).  Cleatus Debby Specking, MD [facility]

## 2024-07-10 NOTE — Assessment & Plan Note (Signed)
 Patient with moderate diffuse pruritus that has been an issue on and off for many years.  Skin is healthy today, no signs of inflammatory condition.  No scaling or rashes.  She has excoriations.  Not much xerosis.  I recommended against the use of Benadryl  given her high risk of falls.  I recommended she continue to use over-the-counter hydrating lotion, like Eucerin or Vaseline.  I recommended over-the-counter use of topical menthol for comfort.  I do not think a topical steroid would be helpful at this point based on the exam, and she already has quite thin skin.  Will check a bilirubin today along with renal function to rule those out as the cause.

## 2024-07-10 NOTE — Assessment & Plan Note (Signed)
 History of CKD stage IIIa.  Based on her sarcopenia, I suspect the creatinine may overestimate her GFR, will check a Cystatin C.  May need to adjust osteoporosis treatment if the GFR is lower.

## 2024-07-10 NOTE — Assessment & Plan Note (Signed)
 Diagnosed based on the presence of an L1 compression fracture which has been chronic, present since at least April on imaging.  Patient is unsure of when this happened.  I recommended we start alendronate 10 mg daily, she is tolerating this well.  No issues with reflux.  Will check vitamin D  levels today.  She has CKD 3A, will recheck renal function with a Cystatin C to ensure safety of the bisphosphonate.

## 2024-07-10 NOTE — Patient Instructions (Signed)
  VISIT SUMMARY: Today, we discussed your back pain, medication management, and recent fall. Your back pain has improved but still remains tender. We also addressed your itching and the recent fall you experienced at home.  YOUR PLAN: -COMPRESSION FRACTURE OF L1 VERTEBRA WITH OSTEOPOROSIS AND LOW BACK PAIN: You have a stable compression fracture in your lower back due to osteoporosis, which means your bones are weaker and more likely to break. Continue taking Fosamax  for at least five years to help strengthen your bones. Physical therapy has been ordered to help strengthen your legs and core muscles, which should also help reduce your back pain.  -PRURITUS: Pruritus means itching. You have been experiencing generalized itching with small bumps under your skin. Continue using Vaseline Intensive Care lotion to help with the itching. Avoid using Benadryl  as it may increase your risk of falling.  -HISTORY OF FALL: You recently tripped and fell at home but did not injure yourself. We discussed the possibility of getting a life alert system for your safety. Physical therapy has been ordered to help improve your strength and reduce the risk of future falls.  INSTRUCTIONS: Please follow up with physical therapy as ordered to help with your back pain and to improve your strength. Consider obtaining a life alert system for added safety at home. Continue taking Fosamax  as prescribed and use Vaseline Intensive Care lotion for your itching. Avoid using Benadryl .

## 2024-07-10 NOTE — Assessment & Plan Note (Signed)
 Recent flare of acute low back pain has improved, now a residual chronic low back pain due to a chronic L1 compression fracture.  Patient is tolerating it pretty well.  Neuroexam is reassuring.  She is diffusely frail and has physical deconditioning.  I recommended physical therapy to work on gait training, leg strengthening, and core strengthening.

## 2024-07-10 NOTE — Assessment & Plan Note (Signed)
 Patient with significant physical deconditioning, at high risk for falls.  She has diffuse sarcopenia and declining functional status at home.  Her husband is also has health issues, recently had heart surgery.  I recommended physical therapy to work on conditioning and gait training.  I recommended consideration of a life alert because I think she will remain high risk for falls and injury.  Would like PT to evaluate for any assist device needs.

## 2024-07-12 ENCOUNTER — Ambulatory Visit: Payer: Self-pay | Admitting: Student in an Organized Health Care Education/Training Program

## 2024-07-12 LAB — CYSTATIN C WITH GLOMERULAR FILTRATION RATE, ESTIMATED (EGFR)
CYSTATIN C: 2.31 mg/L — ABNORMAL HIGH (ref 0.52–1.10)
eGFR: 23 mL/min/1.73m2 — ABNORMAL LOW (ref >=60)

## 2024-07-16 ENCOUNTER — Ambulatory Visit: Payer: Self-pay

## 2024-07-16 NOTE — Telephone Encounter (Signed)
 FYI Only or Action Required?: FYI only for provider: appointment scheduled on 07/17/24.  Patient was last seen in primary care on 07/10/2024 by Jerrell Cleatus Ned, MD.  Called Nurse Triage reporting Rash.  Symptoms began a week ago.  Interventions attempted: OTC medications: Various creams and lotions and Prescription medications: Benadryl .  Symptoms are: gradually worsening.  Triage Disposition: See Physician Within 24 Hours  Patient/caregiver understands and will follow disposition?: Yes  Copied from CRM #[id number]. Topic: Clinical - Red Word Triage >> Jul 16, 2024  9:46 AM Mesmerise C wrote: [facility] that prompted transfer to Nurse Triage: Patient stated she was in last week she mentioned break out but it's been getting worse, it's red and now itchy keeps her up at night Reason for Disposition  SEVERE itching (i.e., interferes with sleep, normal activities or school)  Answer Assessment - Initial Assessment Questions Pt reports onset of red bumpy rash 1.5 weeks ago that started on her chest and back. Has now spread to her entire body except her feet. Denies swelling of lips tongue or throat or SOB. Denies fever. Has not started any new medications or introduced any new home or personal products recently. Seen at home office by different provider last Wednesday when rash was more mid. Plan at that time to get labwork and apply topical lotion and menthol.  Scheduled appt with PCP tomorrow. Advised UC or ED for worsening symptoms.   1. APPEARANCE of RASH: What does the rash look like? (e.g., blisters, dry flaky skin, red spots, redness, sores)     Red bumpy rash, open sore since she's been scratching  2. SIZE: How big are the spots? (e.g., tip of pen, eraser, coin; inches, centimeters)     Generalized  3. LOCATION: Where is the rash located?     All over, except for feet  4. COLOR: What color is the rash? (Note: It is difficult to assess rash color in people with  darker-colored skin. When this situation occurs, simply ask the caller to describe what they see.)     Red  5. ONSET: When did the rash begin?     1.5 weeks ago  6. FEVER: Do you have a fever? If Yes, ask: What is your temperature, how was it measured, and when did it start?     Denies  7. ITCHING: Does the rash itch? If Yes, ask: How bad is the itch? (Scale 1-10; or mild, moderate, severe)     Constant, moderate  8. CAUSE: What do you think is causing the rash?     Unsure. Has not changed any home or personal prodcuts  9. MEDICINE FACTORS: Have you started any new medicines within the last 2 weeks? (e.g., antibiotics)      Denies  10. OTHER SYMPTOMS: Do you have any other symptoms? (e.g., dizziness, headache, sore throat, joint pain)       Denies  Protocols used: Rash or Redness - [facility]

## 2024-07-16 NOTE — Telephone Encounter (Signed)
 FYI

## 2024-07-17 ENCOUNTER — Encounter: Payer: Self-pay | Admitting: Family Medicine

## 2024-07-17 ENCOUNTER — Ambulatory Visit: Admitting: Family Medicine

## 2024-07-17 VITALS — BP 110/62 | Temp 98.0°F | Ht 62.0 in | Wt 97.1 lb

## 2024-07-17 DIAGNOSIS — R21 Rash and other nonspecific skin eruption: Secondary | ICD-10-CM | POA: Diagnosis not present

## 2024-07-17 MED ORDER — TRIAMCINOLONE ACETONIDE 0.1 % EX OINT: 1.0000 | TOPICAL_OINTMENT | Freq: Two times a day (BID) | CUTANEOUS | 1 refills | Status: AC

## 2024-07-17 MED ORDER — PREDNISONE 10 MG PO TABS: ORAL_TABLET | ORAL | 0 refills | Status: AC

## 2024-07-17 NOTE — Patient Instructions (Signed)
 Follow up as needed or as scheduled START the Prednisone  as directed- 3 pills at the same time x3 days, then 2 pills at the same time x3 days, then 1 pill daily.  Take w/ food  APPLY the triamcinolone  ointment in a thin layer twice daily to the dry, itchy areas Call with any questions or concerns Stay Safe!  Stay Healthy! Happy Thanksgiving!!!

## 2024-07-17 NOTE — Progress Notes (Signed)
   Subjective:    Patient ID: Kaitlyn Adams, female    DOB: 28-Oct-1950, 73 y.o.   MRN: [id number]  HPI Rash- sxs started Sunday on chest.  Has since spread to legs and arms.  Areas are red and itchy.  Sxs improve w/ scratching.  Nothing worsens sxs.  No changes to soaps, detergents, medication.  No one else has similar rash.  Sxs are unchanged from yesterday.   Review of Systems For ROS see HPI     Objective:   Physical Exam Vitals reviewed.  Constitutional:      General: She is not in acute distress.    Appearance: Normal appearance. She is ill-appearing (frail, elderly woman).  HENT:     Head: Normocephalic and atraumatic.  Skin:    General: Skin is warm and dry.     Findings: Rash (maculpapular rash scattered on trunk w/ excoriations consistent w/ dry skin/eczema) present.  Neurological:     Mental Status: She is alert and oriented to person, place, and time. Mental status is at baseline.  Psychiatric:        Mood and Affect: Mood normal.        Behavior: Behavior normal.        Thought Content: Thought content normal.           Assessment & Plan:   Rash- new.  Given the severity of itching and the fact that she has broken the skin on her back, will start Prednisone  taper.  Sxs are consistent w/ eczema/dry skin.  Will start topical triamcinolone  ointment BID prn.  Pt expressed understanding and is in agreement w/ plan.

## 2024-07-22 ENCOUNTER — Ambulatory Visit: Admitting: Family Medicine

## 2024-07-22 ENCOUNTER — Ambulatory Visit: Payer: Self-pay

## 2024-07-22 NOTE — Telephone Encounter (Signed)
 FYI - Has Pulm appt on Weds. Missed appointment with PCP today. Will FU with PCP after seeing Pulm.  ED/UC/Callback precautions advised. Will see Pulm Weds AM

## 2024-07-22 NOTE — Telephone Encounter (Signed)
 FYI Only or Action Required?: Action required by provider: update on patient condition.  Patient was last seen in primary care on 07/17/2024 by Mahlon Comer BRAVO, MD.  Called Nurse Triage reporting Nasal Congestion.  Symptoms began several days ago.  Interventions attempted: Rest, hydration, or home remedies.  Symptoms are: gradually worsening.  Triage Disposition: See PCP When Office is Open (Within 3 Days)  Patient/caregiver understands and will follow disposition?: Yes  Copied from CRM #[id number]. Topic: Clinical - Red Word Triage >> Jul 22, 2024 12:28 PM Eva FALCON wrote: Red Word that prompted transfer to Nurse Triage: congestion, chest is bothering her, severe headache above eyes. Reason for Disposition  [1] Sinus congestion (pressure, fullness) AND [2] present > 10 days  Answer Assessment - Initial Assessment Questions Started over the weekend- sinus and chest congestion, headache. Postnasal drip. Denies cough, fever, CP, or dizziness. Has mild SOB at rest. Denies air hungry, cyanosis, struggling to get air. Will try her inhaler to help. Advil- takes the edge off congestion headache. Has Pulm appt on Weds. Missed appointment with PCP today. Will FU with PCP after seeing Pulm.  ED/UC/Callback precautions advised. Will see Pulm Weds AM  1. LOCATION: Where does it hurt?      Above her eyes sinus 2. ONSET: When did the sinus pain start?  (e.g., hours, days)      Over the weekend 3. SEVERITY: How bad is the pain?   (Scale 0-10; or none, mild, moderate or severe)     Head congestion/pain 5-6/10 4. RECURRENT SYMPTOM: Have you ever had sinus problems before? If Yes, ask: When was the last time? and What happened that time?      Chronic bronchitis asthma 5. NASAL CONGESTION: Is the nose blocked? If Yes, ask: Can you open it or must you breathe through your mouth?     congested 6. NASAL DISCHARGE: Do you have discharge from your nose? If so ask, What color?      Going down the back of her throat  7. FEVER: Do you have a fever? If Yes, ask: What is it, how was it measured, and when did it start?      denies 8. OTHER SYMPTOMS: Do you have any other symptoms? (e.g., sore throat, cough, earache, difficulty breathing)     Mild SOB  Protocols used: Sinus Pain or Congestion-A-[facility]

## 2024-07-23 ENCOUNTER — Encounter: Payer: Self-pay | Admitting: Family Medicine

## 2024-07-24 ENCOUNTER — Encounter: Payer: Self-pay | Admitting: Internal Medicine

## 2024-07-24 ENCOUNTER — Ambulatory Visit: Admitting: Internal Medicine

## 2024-07-24 VITALS — BP 126/80 | HR 87 | Temp 97.5°F | Ht 64.0 in | Wt 92.8 lb

## 2024-07-24 DIAGNOSIS — J4541 Moderate persistent asthma with (acute) exacerbation: Secondary | ICD-10-CM | POA: Diagnosis not present

## 2024-07-24 DIAGNOSIS — J302 Other seasonal allergic rhinitis: Secondary | ICD-10-CM | POA: Diagnosis not present

## 2024-07-24 DIAGNOSIS — K219 Gastro-esophageal reflux disease without esophagitis: Secondary | ICD-10-CM

## 2024-07-24 DIAGNOSIS — Z23 Encounter for immunization: Secondary | ICD-10-CM | POA: Diagnosis not present

## 2024-07-24 MED ORDER — PANTOPRAZOLE SODIUM 40 MG PO TBEC: 40.0000 mg | DELAYED_RELEASE_TABLET | Freq: Every day | ORAL | 11 refills | Status: AC

## 2024-07-24 MED ORDER — FLUTICASONE-SALMETEROL 250-50 MCG/ACT IN AEPB: 1.0000 | INHALATION_SPRAY | Freq: Two times a day (BID) | RESPIRATORY_TRACT | 5 refills | Status: AC

## 2024-07-24 MED ORDER — ALBUTEROL SULFATE (2.5 MG/3ML) 0.083% IN NEBU
2.5000 mg | INHALATION_SOLUTION | Freq: Once | RESPIRATORY_TRACT | Status: AC
  Administered 2024-07-24: 2.5 mg via RESPIRATORY_TRACT

## 2024-07-24 NOTE — Progress Notes (Signed)
 Kaitlyn Adams    [id number]    24-Dec-1950  Primary Care Physician:Tabori, Comer BRAVO, MD Date of Appointment: 07/24/2024 Established Patient Visit  Chief complaint:   Chief Complaint  Patient presents with   Asthma    Increase sob and wheezing last few days.     HPI: Kaitlyn Adams is a 73 y.o. woman with chronic cough and mild persistent asthma .  Interval Updates: Here for one year follow up.  2-3 days of shortness of breath, wheezing. She is taking advair as needed - hasn't taken it in a couple of weeks. She thinks she is out of refills. Has taken albuterol  1-2 times the last couple days.   No fevers, chills, night sweats. No cough.  Had one episode of bronchitis over the last year receiving prednisone .   She was started on prednisone  for a rash. She feels the rash has gotten better with prednisone .   She was taken off pantoprazole  sometime over the last year. She is taking tums 2-3 times/day. She stopped taking iron supplementation due to upset stomach.   She lives at home with husband. Her husband says she's not eating well but she thinks she is eating fine. Son and three granddaughters who come and check on her.   Current Regimen: prn albuterol  Asthma Triggers: humidity, URI Exacerbations in the last year: one History of hospitalization or intubation: never Allergy  Testing: never had.  GERD: yes, but not on PPI anymore Allergic Rhinitis: yes controlled on antihistamine xyzal  daily ACT:  Asthma Control Test ACT Total Score  07/24/2024  8:38 AM 19  07/05/2023  8:57 AM 18  04/03/2023  9:14 AM 18   FeNO: unable to complete maneuver.   I have reviewed the patient's family social and past medical history and updated as appropriate.   Past Medical History:  Diagnosis Date   Addison disease (HCC)    Anemia    Anxiety    Arthritis    Asthma    - normal spirometry 2009 FEV1  >90% predicted.  - methacoline challenge neg 11/09   Bronchitis     recurrent   Cataract    Chronic cough    - sinus CT 03-23-10 > neg.  - allergy  profile 03-23-10 > nl, IgE 14.  - flutter valve rx 03-23-10   COPD (chronic obstructive pulmonary disease) (HCC)    Fibromyalgia    GERD (gastroesophageal reflux disease)    exacerbating VCD   Glaucoma    Heart murmur    History of blood transfusion    Hyperlipidemia    Hypertension    Hyperthyroidism    with hot nodule.  - total thyroidectomy 11-05-08 benign -> subsequent hypothyroidism.   Hypokalemia    Hypothyroidism    Hypothyroidism    a. following thyroidectomy.   Mild CAD    a. LHC 04/2015 - mild nonobstructive CAD with 30% dLAD, 40% D1, 25% pLCx, 30% mRCA, 30% RPDA, normal EF >65% with normal LVEDP.   Orthostatic hypotension    Paroxysmal atrial fibrillation (HCC)    Renal insufficiency    Vasomotor rhinitis    exacerbates VCD   Vocal cord dysfunction    proven on FOB 9/09    Past Surgical History:  Procedure Laterality Date   ABDOMINAL HYSTERECTOMY  1980   ABDOMINAL HYSTERECTOMY     BASAL CELL CARCINOMA EXCISION  06/2007   BIOPSY  12/18/2022   Procedure: BIOPSY;  Surgeon: Avram Lupita BRAVO, MD;  Location: [facility];  Service: Gastroenterology;;   CARDIAC CATHETERIZATION N/A 05/15/2015   Procedure: Left Heart Cath and Coronary Angiography;  Surgeon: Ozell Fell, MD;  Location: [facility] CV [facility];  Service: Cardiovascular;  Laterality: N/A;   COLON SURGERY     remove  polyp   COLONOSCOPY N/A 04/27/2017   Dr Shila for scant rectal bleeding and iron def anemia: Melanosis coli, lipomatous IC valve, left sided tics, non-bleeding hemorrhoid, 10 mm transverse polyp.     COLONOSCOPY WITH PROPOFOL  N/A 12/18/2022   Procedure: COLONOSCOPY WITH PROPOFOL ;  Surgeon: Avram Lupita BRAVO, MD;  Location: [facility];  Service: Gastroenterology;  Laterality: N/A;   ESOPHAGOGASTRODUODENOSCOPY N/A 04/27/2017   Shila Gustav GAILS, MD; [facility]. for iron def anemia.  Monilial/candidial esophagitis. Small  HH.     ESOPHAGOGASTRODUODENOSCOPY (EGD) WITH PROPOFOL  N/A 12/18/2022   Procedure: ESOPHAGOGASTRODUODENOSCOPY (EGD) WITH PROPOFOL ;  Surgeon: Avram Lupita BRAVO, MD;  Location: [facility];  Service: Gastroenterology;  Laterality: N/A;   EYE SURGERY     POLYPECTOMY  12/18/2022   Procedure: POLYPECTOMY;  Surgeon: Avram Lupita BRAVO, MD;  Location: [facility];  Service: Gastroenterology;;   scar tissue removal  1982   THYROID  SURGERY  2010   THYROIDECTOMY  11/05/2008    Family History  Problem Relation Age of Onset   Asthma Mother    Heart disease Mother    Alcohol  abuse Mother    Emphysema Father    Heart disease Father    Stroke Father    Vision loss Father    Irregular heart beat Brother    Asthma Grandchild     Social History   Occupational History   Occupation: owns a museum/gallery curator   Occupation: OWNER    Employer: [employer]  Tobacco Use   Smoking status: Never   Smokeless tobacco: Never  Vaping Use   Vaping status: Never Used  Substance and Sexual Activity   Alcohol  use: Yes    Alcohol /week: 1.0 standard drink of alcohol     Types: 1 Glasses of wine per week    Comment: occasional wine or beer   Drug use: No   Sexual activity: Yes    Birth control/protection: None     Physical Exam: Blood pressure 126/80, pulse 87, temperature (!) 97.5 F (36.4 C), temperature source Oral, height 5' 4 (1.626 m), weight 92 lb 12.8 oz (42.1 kg), SpO2 100%.  Gen:     Thin, frail, no distress ENT:  small airway Lungs:   ctab no wheezes or crackles CV:        RRR   Data Reviewed: Imaging: I have personally reviewed the chest xray April 2024- no acute cardiopulmonary process.   PFTs:      No data to display         I have personally reviewed the patient's PFTs and unable to complete feno or spirometry maneuvers in office.   Labs: Lab Results  Component Value Date   WBC 7.7 04/18/2024   HGB 12.1 04/18/2024   HCT 37.5 04/18/2024   MCV 89.1  04/18/2024   PLT 270 04/18/2024    Immunization status: Immunization History  Administered Date(s) Administered   Fluad Quad(high Dose 65+) 10/02/2019   Fluad Trivalent(High Dose 65+) 07/05/2023   INFLUENZA, HIGH DOSE SEASONAL PF 07/06/2017, 08/01/2018   Influenza Split 09/11/2012   Influenza Whole 06/22/2009   Influenza-Unspecified 07/07/2021   PFIZER(Purple Top)SARS-COV-2 Vaccination 10/18/2019, 11/13/2019, 05/28/2020   Pneumococcal Conjugate-13 11/18/2015   Pneumococcal Polysaccharide-23 08/23/2003, 07/06/2017  Tdap 07/10/2012    External Records Personally Reviewed: pcp  Assessment:  Moderate persistent asthma with acute exacerbation Chronic rhinitis, controlled  Thalia not well controlled  Plan/Recommendations:   Sorry to hear your breathing is not doing well.  We gave you a breathing treatment in the office today to help with the shortness of breath and wheezing.  Finish out the prednisone  for your rash.  This will also treat your breathing.  I want you to get back on the Advair.  Start Advair 250 1 puff in the morning, 1 puff at night.  Gargle after use.  He can continue to take the albuterol  inhaler as needed.  We talked about your reflux and how you are taking Tums 2-3 times a day and the reflux is getting up at night.  You have been on acid reflux medication for many years but it is currently discontinued.  The risks of the acid reflux medication include some increased risk for bone fractures related to osteoporosis.  You should also not take it at the same time as your iron (instead take 1 in the morning, 1 at night.)  Because the reflux is keeping you up at night and preventing good quality sleep we will resume acid reflux medication.  However we can always revisit taking it as needed in the future.  Continue xyzal  for the drainage.    Return to Care: Return in about 6 months (around 01/22/2025) for Dr Pleas.   Verdon Gore, MD Pulmonary and Critical Care  Medicine [facility] [phone]

## 2024-07-24 NOTE — Patient Instructions (Addendum)
 It was a pleasure to see you today!  Please schedule follow up with Dr Pleas in 6 months. Please call sooner [phone] if issues or concerns arise. You can also send us  a message through MyChart, but but aware that this is not to be used for urgent issues and it may take up to 5-7 days to receive a reply. Please be aware that you will likely be able to view your results before I have a chance to respond to them. Please give us  5 business days to respond to any non-urgent results.    Sorry to hear your breathing is not doing well.  We gave you a breathing treatment in the office today to help with the shortness of breath and wheezing.  Finish out the prednisone  for your rash.  This will also treat your breathing.  I want you to get back on the Advair.  Start Advair 250 1 puff in the morning, 1 puff at night.  Gargle after use.  He can continue to take the albuterol  inhaler as needed.  We talked about your reflux and how you are taking Tums 2-3 times a day and the reflux is getting up at night.  You have been on acid reflux medication for many years but it is currently discontinued.  The risks of the acid reflux medication include some increased risk for bone fractures related to osteoporosis.  You should also not take it at the same time as your iron (instead take 1 in the morning, 1 at night.)  Because the reflux is keeping you up at night and preventing good quality sleep we will resume acid reflux medication.  However we can always revisit taking it as needed in the future.  Continue xyzal  for the drainage.

## 2024-07-24 NOTE — Addendum Note (Signed)
 Addended by: ROLANDA POWELL SAILOR on: 07/24/2024 11:09 AM   Modules accepted: Orders

## 2024-08-28 ENCOUNTER — Other Ambulatory Visit: Payer: Self-pay | Admitting: Internal Medicine

## 2024-08-28 NOTE — Telephone Encounter (Unsigned)
 Copied from CRM [id number]. Topic: Clinical - Medication Refill >> Aug 28, 2024  2:16 PM Devaughn RAMAN wrote: Medication:  levocetirizine (XYZAL ) 5 MG tablet   Has the patient contacted their pharmacy? Yes, [facility] is the person calling (Agent: If no, request that the patient contact the pharmacy for the refill. If patient does not wish to contact the pharmacy document the reason why and proceed with request.) (Agent: If yes, when and what did the pharmacy advise?)  No refills left on the prescription, message sent out to the provider, in policy to follow up after a certain amount of days without the fax refill has not been responded too. This is the patient's preferred pharmacy:  [id number] - [place], [place] - [id number] [place] [address] Phone: [phone] Fax: [phone]  Is this the correct pharmacy for this prescription? Yes If no, delete pharmacy and type the correct one.   Has the prescription been filled recently? No, Npvember  Is the patient out of the medication? Yes  Has the patient been seen for an appointment in the last year OR does the patient have an upcoming appointment? Yes  Can we respond through MyChart? No  Agent: Please be advised that Rx refills may take up to 3 business days. We ask that you follow-up with your pharmacy.

## 2024-08-31 ENCOUNTER — Other Ambulatory Visit: Payer: Self-pay | Admitting: Family Medicine

## 2024-09-05 MED ORDER — LEVOCETIRIZINE DIHYDROCHLORIDE 5 MG PO TABS: 5.0000 mg | ORAL_TABLET | Freq: Every day | ORAL | 2 refills | Status: AC | PRN

## 2024-09-19 ENCOUNTER — Other Ambulatory Visit: Payer: Self-pay | Admitting: Student in an Organized Health Care Education/Training Program

## 2024-10-10 ENCOUNTER — Ambulatory Visit: Admitting: Family Medicine

## 2024-10-24 ENCOUNTER — Ambulatory Visit: Admitting: Psychology
